# Patient Record
Sex: Male | Born: 1960
Health system: Southern US, Community
[De-identification: ages and names within clinical notes are randomized; demographics above are authoritative.]

## PROBLEM LIST (undated history)

## (undated) VITALS — BP 141/92 | HR 93 | Temp 97.1°F | Resp 20 | Ht 68.11 in | Wt 180.0 lb

## (undated) DIAGNOSIS — I10 Essential (primary) hypertension: Secondary | ICD-10-CM

## (undated) DIAGNOSIS — J40 Bronchitis, not specified as acute or chronic: Secondary | ICD-10-CM

## (undated) DIAGNOSIS — F1910 Other psychoactive substance abuse, uncomplicated: Secondary | ICD-10-CM

## (undated) DIAGNOSIS — G8929 Other chronic pain: Secondary | ICD-10-CM

## (undated) DIAGNOSIS — M79602 Pain in left arm: Secondary | ICD-10-CM

## (undated) DIAGNOSIS — F1010 Alcohol abuse, uncomplicated: Secondary | ICD-10-CM

## (undated) DIAGNOSIS — K859 Acute pancreatitis without necrosis or infection, unspecified: Secondary | ICD-10-CM

## (undated) DIAGNOSIS — F319 Bipolar disorder, unspecified: Secondary | ICD-10-CM

## (undated) DIAGNOSIS — S8290XA Unspecified fracture of unspecified lower leg, initial encounter for closed fracture: Secondary | ICD-10-CM

## (undated) DIAGNOSIS — M549 Dorsalgia, unspecified: Secondary | ICD-10-CM

## (undated) DIAGNOSIS — K863 Pseudocyst of pancreas: Secondary | ICD-10-CM

## (undated) DIAGNOSIS — E785 Hyperlipidemia, unspecified: Secondary | ICD-10-CM

## (undated) DIAGNOSIS — E119 Type 2 diabetes mellitus without complications: Secondary | ICD-10-CM

## (undated) DIAGNOSIS — M1990 Unspecified osteoarthritis, unspecified site: Secondary | ICD-10-CM

## (undated) DIAGNOSIS — K219 Gastro-esophageal reflux disease without esophagitis: Secondary | ICD-10-CM

## (undated) DIAGNOSIS — G43909 Migraine, unspecified, not intractable, without status migrainosus: Secondary | ICD-10-CM

## (undated) DIAGNOSIS — M542 Cervicalgia: Secondary | ICD-10-CM

## (undated) HISTORY — DX: Bipolar disorder, unspecified: F31.9

## (undated) HISTORY — DX: Alcohol abuse, uncomplicated: F10.10

## (undated) HISTORY — PX: NOSE SURGERY: SHX723

## (undated) HISTORY — DX: Hyperlipidemia, unspecified: E78.5

---

## 2004-05-02 ENCOUNTER — Emergency Department (HOSPITAL_COMMUNITY): Admission: EM | Admit: 2004-05-02 | Discharge: 2004-05-02 | Payer: Self-pay | Admitting: *Deleted

## 2005-04-06 ENCOUNTER — Emergency Department (HOSPITAL_COMMUNITY): Admission: EM | Admit: 2005-04-06 | Discharge: 2005-04-06 | Payer: Self-pay | Admitting: Emergency Medicine

## 2007-05-23 ENCOUNTER — Emergency Department (HOSPITAL_COMMUNITY): Admission: EM | Admit: 2007-05-23 | Discharge: 2007-05-23 | Payer: Self-pay | Admitting: Emergency Medicine

## 2007-07-13 ENCOUNTER — Emergency Department (HOSPITAL_COMMUNITY): Admission: EM | Admit: 2007-07-13 | Discharge: 2007-07-13 | Payer: Self-pay | Admitting: Emergency Medicine

## 2007-10-11 ENCOUNTER — Emergency Department (HOSPITAL_COMMUNITY): Admission: EM | Admit: 2007-10-11 | Discharge: 2007-10-11 | Payer: Self-pay | Admitting: Emergency Medicine

## 2007-10-25 ENCOUNTER — Emergency Department (HOSPITAL_COMMUNITY): Admission: EM | Admit: 2007-10-25 | Discharge: 2007-10-25 | Payer: Self-pay | Admitting: Emergency Medicine

## 2008-07-29 ENCOUNTER — Emergency Department (HOSPITAL_COMMUNITY): Admission: EM | Admit: 2008-07-29 | Discharge: 2008-07-29 | Payer: Self-pay | Admitting: Emergency Medicine

## 2008-09-19 ENCOUNTER — Emergency Department (HOSPITAL_COMMUNITY): Admission: EM | Admit: 2008-09-19 | Discharge: 2008-09-19 | Payer: Self-pay | Admitting: Emergency Medicine

## 2008-12-09 ENCOUNTER — Emergency Department (HOSPITAL_COMMUNITY): Admission: EM | Admit: 2008-12-09 | Discharge: 2008-12-09 | Payer: Self-pay | Admitting: Emergency Medicine

## 2008-12-11 ENCOUNTER — Emergency Department (HOSPITAL_COMMUNITY): Admission: EM | Admit: 2008-12-11 | Discharge: 2008-12-11 | Payer: Self-pay | Admitting: Emergency Medicine

## 2009-05-18 ENCOUNTER — Emergency Department (HOSPITAL_COMMUNITY): Admission: EM | Admit: 2009-05-18 | Discharge: 2009-05-18 | Payer: Self-pay | Admitting: Emergency Medicine

## 2009-06-07 ENCOUNTER — Emergency Department (HOSPITAL_COMMUNITY): Admission: EM | Admit: 2009-06-07 | Discharge: 2009-06-07 | Payer: Self-pay | Admitting: Emergency Medicine

## 2009-06-09 ENCOUNTER — Emergency Department (HOSPITAL_COMMUNITY): Admission: EM | Admit: 2009-06-09 | Discharge: 2009-06-09 | Payer: Self-pay | Admitting: Otolaryngology

## 2009-06-12 ENCOUNTER — Emergency Department (HOSPITAL_COMMUNITY): Admission: EM | Admit: 2009-06-12 | Discharge: 2009-06-12 | Payer: Self-pay | Admitting: Emergency Medicine

## 2009-08-08 ENCOUNTER — Emergency Department (HOSPITAL_COMMUNITY): Admission: EM | Admit: 2009-08-08 | Discharge: 2009-08-08 | Payer: Self-pay | Admitting: Emergency Medicine

## 2009-08-17 ENCOUNTER — Emergency Department (HOSPITAL_COMMUNITY): Admission: EM | Admit: 2009-08-17 | Discharge: 2009-08-17 | Payer: Self-pay | Admitting: Emergency Medicine

## 2010-07-03 ENCOUNTER — Emergency Department (HOSPITAL_COMMUNITY): Admission: EM | Admit: 2010-07-03 | Discharge: 2010-07-04 | Payer: Self-pay | Admitting: Emergency Medicine

## 2010-07-25 ENCOUNTER — Emergency Department (HOSPITAL_COMMUNITY): Admission: EM | Admit: 2010-07-25 | Discharge: 2010-07-25 | Payer: Self-pay | Admitting: Emergency Medicine

## 2010-08-05 ENCOUNTER — Emergency Department (HOSPITAL_COMMUNITY): Admission: EM | Admit: 2010-08-05 | Discharge: 2010-08-05 | Payer: Self-pay | Admitting: Emergency Medicine

## 2011-02-23 ENCOUNTER — Emergency Department (HOSPITAL_COMMUNITY)
Admission: EM | Admit: 2011-02-23 | Discharge: 2011-02-23 | Disposition: A | Discharge status: Home / Self Care | Payer: Medicaid Other | Attending: Emergency Medicine | Admitting: Emergency Medicine

## 2011-02-23 DIAGNOSIS — M79609 Pain in unspecified limb: Secondary | ICD-10-CM | POA: Insufficient documentation

## 2011-02-23 DIAGNOSIS — M5430 Sciatica, unspecified side: Secondary | ICD-10-CM | POA: Insufficient documentation

## 2011-03-08 LAB — BASIC METABOLIC PANEL
BUN: 13 mg/dL (ref 6–23)
CO2: 28 mEq/L (ref 19–32)
Calcium: 8.5 mg/dL (ref 8.4–10.5)
Chloride: 101 mEq/L (ref 96–112)
Creatinine, Ser: 1.06 mg/dL (ref 0.4–1.5)
GFR calc Af Amer: >60 mL/min (ref >60)
GFR calc non Af Amer: >60 mL/min (ref >60)
Glucose, Bld: 86 mg/dL (ref 70–99)
Potassium: 4 mEq/L (ref 3.5–5.1)
Sodium: 136 mEq/L (ref 135–145)

## 2011-03-08 LAB — URIC ACID: Uric Acid, Serum: 8.9 mg/dL — ABNORMAL HIGH (ref 4.0–7.8)

## 2011-03-10 LAB — ETHANOL: Alcohol, Ethyl (B): 12 mg/dL — ABNORMAL HIGH (ref 0–10)

## 2011-03-10 LAB — COMPREHENSIVE METABOLIC PANEL
ALT: 18 U/L (ref 0–53)
AST: 34 U/L (ref 0–37)
Albumin: 3.5 g/dL (ref 3.5–5.2)
Alkaline Phosphatase: 39 U/L (ref 39–117)
BUN: 20 mg/dL (ref 6–23)
CO2: 28 mEq/L (ref 19–32)
Calcium: 8.7 mg/dL (ref 8.4–10.5)
Chloride: 101 mEq/L (ref 96–112)
Creatinine, Ser: 0.89 mg/dL (ref 0.4–1.5)
GFR calc Af Amer: >60 mL/min (ref >60)
GFR calc non Af Amer: >60 mL/min (ref >60)
Glucose, Bld: 88 mg/dL (ref 70–99)
Potassium: 3.5 mEq/L (ref 3.5–5.1)
Sodium: 137 mEq/L (ref 135–145)
Total Bilirubin: 0.8 mg/dL (ref 0.3–1.2)
Total Protein: 6.6 g/dL (ref 6.0–8.3)

## 2011-03-10 LAB — DIFFERENTIAL
Basophils Absolute: 0 10*3/uL (ref 0.0–0.1)
Basophils Relative: 1 % (ref 0–1)
Eosinophils Absolute: 0.1 10*3/uL (ref 0.0–0.7)
Eosinophils Relative: 2 % (ref 0–5)
Lymphocytes Relative: 42 % (ref 12–46)
Lymphs Abs: 2.2 10*3/uL (ref 0.7–4.0)
Monocytes Absolute: 0.4 10*3/uL (ref 0.1–1.0)
Monocytes Relative: 8 % (ref 3–12)
Neutro Abs: 2.5 10*3/uL (ref 1.7–7.7)
Neutrophils Relative %: 47 % (ref 43–77)

## 2011-03-10 LAB — LIPASE, BLOOD: Lipase: 25 U/L (ref 11–59)

## 2011-03-10 LAB — CBC
HCT: 40.5 % (ref 39.0–52.0)
Hemoglobin: 14 g/dL (ref 13.0–17.0)
MCH: 31 pg (ref 26.0–34.0)
MCHC: 34.6 g/dL (ref 30.0–36.0)
MCV: 89.5 fL (ref 78.0–100.0)
Platelets: 259 10*3/uL (ref 150–400)
RBC: 4.53 MIL/uL (ref 4.22–5.81)
RDW: 15.7 % — ABNORMAL HIGH (ref 11.5–15.5)
WBC: 5.2 10*3/uL (ref 4.0–10.5)

## 2011-03-13 ENCOUNTER — Inpatient Hospital Stay (HOSPITAL_COMMUNITY)
Admission: RE | Admit: 2011-03-13 | Discharge: 2011-03-16 | DRG: 439 | Disposition: A | Discharge status: Home / Self Care | Payer: Medicare Other | Source: Ambulatory Visit | Attending: Internal Medicine | Admitting: Internal Medicine

## 2011-03-13 DIAGNOSIS — K7010 Alcoholic hepatitis without ascites: Secondary | ICD-10-CM | POA: Diagnosis present

## 2011-03-13 DIAGNOSIS — K7689 Other specified diseases of liver: Secondary | ICD-10-CM | POA: Diagnosis present

## 2011-03-13 DIAGNOSIS — E872 Acidosis, unspecified: Secondary | ICD-10-CM | POA: Diagnosis present

## 2011-03-13 DIAGNOSIS — F17200 Nicotine dependence, unspecified, uncomplicated: Secondary | ICD-10-CM | POA: Diagnosis present

## 2011-03-13 DIAGNOSIS — F1020 Alcohol dependence, uncomplicated: Secondary | ICD-10-CM | POA: Diagnosis present

## 2011-03-13 DIAGNOSIS — E871 Hypo-osmolality and hyponatremia: Secondary | ICD-10-CM | POA: Diagnosis not present

## 2011-03-13 DIAGNOSIS — E781 Pure hyperglyceridemia: Secondary | ICD-10-CM | POA: Diagnosis present

## 2011-03-13 DIAGNOSIS — K859 Acute pancreatitis without necrosis or infection, unspecified: Principal | ICD-10-CM | POA: Diagnosis present

## 2011-03-13 DIAGNOSIS — E876 Hypokalemia: Secondary | ICD-10-CM | POA: Diagnosis not present

## 2011-03-14 ENCOUNTER — Emergency Department (HOSPITAL_COMMUNITY): Payer: Medicare Other

## 2011-03-14 LAB — DIFFERENTIAL
Basophils Absolute: 0.1 10*3/uL (ref 0.0–0.1)
Basophils Relative: 1 % (ref 0–1)
Eosinophils Absolute: 0.1 10*3/uL (ref 0.0–0.7)
Eosinophils Relative: 1 % (ref 0–5)
Lymphocytes Relative: 31 % (ref 12–46)
Lymphs Abs: 2.3 10*3/uL (ref 0.7–4.0)
Monocytes Absolute: 0.7 10*3/uL (ref 0.1–1.0)
Monocytes Relative: 9 % (ref 3–12)
Neutro Abs: 4.1 10*3/uL (ref 1.7–7.7)
Neutrophils Relative %: 58 % (ref 43–77)

## 2011-03-14 LAB — COMPREHENSIVE METABOLIC PANEL
ALT: 35 U/L (ref 0–53)
AST: 101 U/L — ABNORMAL HIGH (ref 0–37)
Albumin: 3.8 g/dL (ref 3.5–5.2)
Alkaline Phosphatase: 51 U/L (ref 39–117)
BUN: 13 mg/dL (ref 6–23)
CO2: 15 mEq/L — ABNORMAL LOW (ref 19–32)
Calcium: 8.4 mg/dL (ref 8.4–10.5)
Chloride: 107 mEq/L (ref 96–112)
Creatinine, Ser: 0.92 mg/dL (ref 0.4–1.5)
GFR calc Af Amer: >60 mL/min (ref >60)
GFR calc non Af Amer: >60 mL/min (ref >60)
Glucose, Bld: 91 mg/dL (ref 70–99)
Potassium: 4.4 mEq/L (ref 3.5–5.1)
Sodium: 141 mEq/L (ref 135–145)
Total Bilirubin: 1.4 mg/dL — ABNORMAL HIGH (ref 0.3–1.2)
Total Protein: 7.1 g/dL (ref 6.0–8.3)

## 2011-03-14 LAB — URINALYSIS, ROUTINE W REFLEX MICROSCOPIC
Bilirubin Urine: NEGATIVE
Glucose, UA: NEGATIVE mg/dL
Hgb urine dipstick: NEGATIVE
Ketones, ur: NEGATIVE mg/dL
Nitrite: NEGATIVE
Protein, ur: NEGATIVE mg/dL
Specific Gravity, Urine: 1.01 (ref 1.005–1.030)
Urobilinogen, UA: 0.2 mg/dL (ref 0.0–1.0)
pH: 5 (ref 5.0–8.0)

## 2011-03-14 LAB — CBC
HCT: 41.8 % (ref 39.0–52.0)
Hemoglobin: 14.8 g/dL (ref 13.0–17.0)
MCH: 31.4 pg (ref 26.0–34.0)
MCHC: 35.4 g/dL (ref 30.0–36.0)
MCV: 88.7 fL (ref 78.0–100.0)
Platelets: 251 10*3/uL (ref 150–400)
RBC: 4.71 MIL/uL (ref 4.22–5.81)
RDW: 15.9 % — ABNORMAL HIGH (ref 11.5–15.5)
WBC: 7.3 10*3/uL (ref 4.0–10.5)

## 2011-03-14 LAB — LIPASE, BLOOD: Lipase: 101 U/L — ABNORMAL HIGH (ref 11–59)

## 2011-03-14 LAB — TYPE AND SCREEN
ABO/RH(D): A POS
Antibody Screen: NEGATIVE

## 2011-03-14 LAB — LACTIC ACID, PLASMA: Lactic Acid, Venous: 2.7 mmol/L — ABNORMAL HIGH (ref 0.5–2.2)

## 2011-03-14 MED ORDER — IOHEXOL 300 MG/ML  SOLN
100.0000 mL | Freq: Once | INTRAMUSCULAR | Status: AC | PRN
  Administered 2011-03-14: 100 mL via INTRAVENOUS

## 2011-03-15 LAB — COMPREHENSIVE METABOLIC PANEL WITH GFR
ALT: 42 U/L (ref 0–53)
AST: 77 U/L — ABNORMAL HIGH (ref 0–37)
Albumin: 3 g/dL — ABNORMAL LOW (ref 3.5–5.2)
Alkaline Phosphatase: 48 U/L (ref 39–117)
BUN: 6 mg/dL (ref 6–23)
CO2: 25 meq/L (ref 19–32)
Calcium: 7.4 mg/dL — ABNORMAL LOW (ref 8.4–10.5)
Chloride: 97 meq/L (ref 96–112)
Creatinine, Ser: 0.82 mg/dL (ref 0.4–1.5)
GFR calc non Af Amer: >60 mL/min
Glucose, Bld: 120 mg/dL — ABNORMAL HIGH (ref 70–99)
Potassium: 3.8 meq/L (ref 3.5–5.1)
Sodium: 132 meq/L — ABNORMAL LOW (ref 135–145)
Total Bilirubin: 0.9 mg/dL (ref 0.3–1.2)
Total Protein: 6.6 g/dL (ref 6.0–8.3)

## 2011-03-15 LAB — CBC
HCT: 41 % (ref 39.0–52.0)
Hemoglobin: 13.8 g/dL (ref 13.0–17.0)
MCH: 30.4 pg (ref 26.0–34.0)
MCHC: 33.7 g/dL (ref 30.0–36.0)
MCV: 90.3 fL (ref 78.0–100.0)
Platelets: 215 10*3/uL (ref 150–400)
RBC: 4.54 MIL/uL (ref 4.22–5.81)
RDW: 16.3 % — ABNORMAL HIGH (ref 11.5–15.5)
WBC: 8.7 10*3/uL (ref 4.0–10.5)

## 2011-03-15 LAB — DIFFERENTIAL
Basophils Absolute: 0 10*3/uL (ref 0.0–0.1)
Basophils Relative: 0 % (ref 0–1)
Eosinophils Absolute: 0 10*3/uL (ref 0.0–0.7)
Eosinophils Relative: 0 % (ref 0–5)
Lymphocytes Relative: 12 % (ref 12–46)
Lymphs Abs: 1 10*3/uL (ref 0.7–4.0)
Monocytes Absolute: 0.4 10*3/uL (ref 0.1–1.0)
Monocytes Relative: 5 % (ref 3–12)
Neutro Abs: 7.2 10*3/uL (ref 1.7–7.7)
Neutrophils Relative %: 83 % — ABNORMAL HIGH (ref 43–77)

## 2011-03-15 LAB — LIPASE, BLOOD: Lipase: 92 U/L — ABNORMAL HIGH (ref 11–59)

## 2011-03-15 LAB — TRIGLYCERIDES: Triglycerides: 1726 mg/dL — ABNORMAL HIGH

## 2011-03-16 LAB — BASIC METABOLIC PANEL
BUN: 4 mg/dL — ABNORMAL LOW (ref 6–23)
CO2: 29 mEq/L (ref 19–32)
Calcium: 7.2 mg/dL — ABNORMAL LOW (ref 8.4–10.5)
Chloride: 98 mEq/L (ref 96–112)
Creatinine, Ser: 0.82 mg/dL (ref 0.4–1.5)
GFR calc Af Amer: >60 mL/min (ref >60)
GFR calc non Af Amer: >60 mL/min (ref >60)
Glucose, Bld: 95 mg/dL (ref 70–99)
Potassium: 3.4 mEq/L — ABNORMAL LOW (ref 3.5–5.1)
Sodium: 132 mEq/L — ABNORMAL LOW (ref 135–145)

## 2011-03-16 LAB — MAGNESIUM: Magnesium: 2.1 mg/dL (ref 1.5–2.5)

## 2011-03-16 LAB — ALBUMIN: Albumin: 2.7 g/dL — ABNORMAL LOW (ref 3.5–5.2)

## 2011-03-16 LAB — LIPASE, BLOOD: Lipase: 49 U/L (ref 11–59)

## 2011-03-16 LAB — LACTIC ACID, PLASMA: Lactic Acid, Venous: 1.1 mmol/L (ref 0.5–2.2)

## 2011-03-16 NOTE — H&P (Signed)
[name] [name], [name] [name]                 ACCOUNT NO.:  [id number]  MEDICAL RECORD NO.:  [id number]           PATIENT TYPE:  I  LOCATION:  A214                          FACILITY:  [facility]  PHYSICIAN:  Houston Siren, MD           DATE OF BIRTH:  [birth year]  DATE OF ADMISSION:  03/13/2011 DATE OF DISCHARGE:  LH                             HISTORY & PHYSICAL   PRIMARY CARE PHYSICIAN:  None.  REASON FOR ADMISSION:  Acute pancreatitis.  ADVANCE DIRECTIVES:  Full code.  HISTORY OF PRESENT ILLNESS:  This is a 50 year old male with history of alcohol abuse (currently 5-6 beers per day), GERD, gout, and polysubstance abuse, presents to [facility] Emergency Room with epigastric pain, nausea, but no vomiting for 24 hours.  He has no fever or chills, black or bloody stool.  He denies any diarrhea and no chest pain or shortness of breath.  He has no distant travel or ill contact. Wife denies him using any illicit drugs at this time.  Evaluation in the emergency room included abdominal CT which showed pancreatitis and fatty liver along with mildly distended gallbladder, but no visible stone. His lipase is 101 (11-59).  He has a normal creatinine of 0.92 and his blood glucose is 91.  He has normal liver function tests.  He has no fever and a normal white count of 7300.  His hemoglobin is normal 14.8. He received several bouts of IV Dilaudid but still has persistent pain, and thus hospitalist was asked to admit the patient for acute alcoholic pancreatitis.  PAST MEDICAL HISTORY:  As above.  ALLERGY:  BEE STING and PENICILLIN.  CURRENT MEDICATIONS:  Zantac.  PAST SURGICAL HISTORY:  None.  SOCIAL HISTORY:  He is a smoker and drinks heavily.  He is married and has one child.  Previously, he worked as a Pharmacist, community.  REVIEW OF SYSTEMS:  Otherwise unremarkable.  PHYSICAL EXAMINATION:  VITAL SIGNS:  Heart rate of 94, blood pressure 114/78, and respiratory rate 16. GENERAL:  He is alert and  oriented and is in no apparent distress.  He does have abdominal pain with frequent grimaces. HEENT:  Sclerae are nonicteric.  Throat is clear.  Tongue is midline. Speech is fluent. NECK:  Supple. CARDIAC:  S1 and S2 regular.  I did not hear any murmur, rub, or gallop. LUNGS:  Clear. ABDOMEN:  Tender diffusely, especially in the epigastric area.  Bowel sounds present.  No rebound.  No palpable mass. EXTREMITIES:  No edema.  No calf tenderness.  He has good distal pulses bilaterally and does not have any acro or central cyanosis. NEUROLOGIC:  Unremarkable. PSYCHIATRIC:  Unremarkable.  OBJECTIVE FINDINGS:  Serum sodium 141, potassium 4.4, creatinine 0.92, BUN of 13, bicarb of 15.  Liver function tests are normal.  White count 7300, hemoglobin of 14.8, platelet count of 251,000.  Abdominal and pelvic CT showed fatty infiltration of liver with inflammatory process in the right upper quadrant of the abdomen, most consistent with pancreatitis.  Lactic acid is 2.7.  Abdominal series negative.  No EKG was done.  IMPRESSION:  This is a 50 year old presenting with acute alcoholic pancreatitis.  He has severe pain, but overall he is not very ill.  He has no evidence of infection and maintained hemodynamic stability.  We will admit him to the telemetry floor.  We will give pain medication along with IV fluids.  He is at risk for alcohol withdrawal, and we will put on Ativan p.o. CIWA protocol.  We will admit him to [facility] Team #2. He is a full code.  I discussed that it is important for him to stop alcohol and cigarettes.     Houston Siren, MD     PL/MEDQ  D:  03/14/2011  T:  03/14/2011  Job:  [id number]  Electronically Signed by Houston Siren  on 03/16/2011 10:07:31 PM

## 2011-03-17 LAB — CALCIUM, IONIZED: Calcium, Ion: 1.01 mmol/L — ABNORMAL LOW (ref 1.12–1.32)

## 2011-03-17 NOTE — Discharge Summary (Signed)
NAMEBRYSON, [name]                 ACCOUNT NO.:  [id number]  MEDICAL RECORD NO.:  [id number]           PATIENT TYPE:  I  LOCATION:  A316                          FACILITY:  [facility]  PHYSICIAN:  Elliot Cousin, M.D.    DATE OF BIRTH:  [birth year]  DATE OF ADMISSION:  03/13/2011 DATE OF DISCHARGE:  03/24/2012LH                              DISCHARGE SUMMARY   DISCHARGE DIAGNOSES: 1. Acute pancreatitis, likely secondary to a combination of     hypertriglyceridemia and alcohol abuse.  The patient's lipase was     101 on admission and 49 at the time of hospital discharge. 2. Inflammatory process in the right upper quadrant of the abdomen     most consistent with pancreatitis and fatty infiltration of the     liver per CT scan of the abdomen and pelvis with contrast on March 14, 2011. 3. Hypertriglyceridemia.  The patient's triglyceride level was     elevated at 1726. 4. Mild alcohol-associated hepatitis. 5. Hypocalcemia which corrected to 8.2. 6. Metabolic/lactic acidosis, resolved. 7. Alcohol abuse. 8. Tobacco abuse. 9. Hypokalemia.  DISCHARGE MEDICATIONS: 1. Gemfibrozil 600 mg b.i.d. before meals.  2. Multivitamin 1 capsule daily. 3. Percocet 5/325 mg every 6 hours as needed for pain. 4. Phenergan 12.5 mg 1-2 tablets every 6 hours as needed for nausea. 5. MiraLax laxative 17 g daily as needed for constipation. 6. Loratadine 10 mg daily as needed for allergy symptoms. 7. Ranitidine 150 mg daily as needed for reflux or indigestion.  DISCHARGE DISPOSITION:  The patient was discharged to home in improved and stable condition on March 16, 2011.  He was advised to follow up with a primary care provider at the [facility] Department at the next available appointment.  CONSULTATIONS:  None.  PROCEDURE PERFORMED IN THE EMERGENCY DEPARTMENT: 1. CT of the abdomen and pelvis with contrast on March 14, 2011.  The     results were dictated above.  In addition, the  gallbladder was     mildly distended with no evidence of gallstones. 2. Acute abdominal series on March 14, 2011.  The results revealed a     normal study.  HISTORY OF PRESENT ILLNESS:  The patient is a 50 year old man with a past medical history significant for seasonal allergies, heartburn, and alcohol abuse.  He presented to the emergency department on March 13, 2011, with a chief complaint of abdominal pain.  When he was initially evaluated, he was noted to be hemodynamically stable and afebrile.  His lipase was elevated at 101.  His lactic acid was elevated at 2.7.  His total bilirubin was 1.4, SGOT was 101, and SGPT was 35.  His acute abdominal series was within normal limits.  The CT scan of his abdomen and pelvis revealed findings consistent with acute pancreatitis.  He was admitted for further evaluation and management.  HOSPITAL COURSE:  The patient was made to be n.p.o. for bowel rest.  His pain was treated with as-needed Dilaudid.  He was started on IV fluid hydration.  The alcohol withdrawal protocol was ordered with Ativan and  vitamin therapy.  Because of his alcohol abuse, he was strongly counseled to stop drinking.  He did not believe he had a problem that needed further assistance.  I did recommend Alcoholics Anonymous anyway.  He did not show any signs or symptoms consistent with alcohol withdrawal syndrome.  He was started on as-needed Zofran and as-needed Phenergan for nausea.  His lipase and liver transaminases were followed closely.  For further evaluation, a triglyceride level was ordered.   It was quite elevated at 1726.  Following the result today, he was started on gemfibrozil 600 mg b.i.d.  His urinalysis was within normal limits.  His lactic acid which had been elevated at 2.7, normalized to 1.1 upon discharge.  His CO2 which was initially 15 normalized to 29 prior to discharge.  The metabolic acidosis may have been secondary to the acute pancreatitis and    alcohol abuse.  An alcohol level was not ordered on admission.  The patient's blood calcium level fell from 8.4 on admission to 7.2 at the time of hospital discharge.  His albumin level was low at 2.7.  With correction, his calcium level was 8.2, virtually within normal limits. His serum potassium also fell from 4.4 to 3.4 prior to discharge.  This was thought to be secondary to hemodilution from the IV fluids.  He was repleted with 40 mEq of potassium chloride prior to discharge.  His blood magnesium level was within normal limits at 2.1.  The patient became less symptomatic.  His diet was advanced slowly.  He tolerated the advancement fairly well.  He did complain of constipation.  He was given MiraLax which did relieve his constipation. He was strongly advised to follow a low-fat diet and to discontinue drinking alcohol altogether.  He was also counseled on tobacco cessation.  A nicotine patch was placed.  He was advised to follow up at the [facility] for follow up of his triglyceride level and for primary care.  He voiced understanding.     Elliot Cousin, M.D.     DF/MEDQ  D:  03/16/2011  T:  03/16/2011  Job:  [id number]  cc:   [facility].  Electronically Signed by Elliot Cousin M.D. on 03/17/2011 02:45:25 PM

## 2011-03-25 ENCOUNTER — Emergency Department (HOSPITAL_COMMUNITY)
Admission: EM | Admit: 2011-03-25 | Discharge: 2011-03-26 | Disposition: A | Discharge status: Home / Self Care | Payer: Medicaid Other | Attending: Emergency Medicine | Admitting: Emergency Medicine

## 2011-03-25 ENCOUNTER — Emergency Department (HOSPITAL_COMMUNITY): Payer: Medicaid Other

## 2011-03-25 DIAGNOSIS — K7689 Other specified diseases of liver: Secondary | ICD-10-CM | POA: Insufficient documentation

## 2011-03-25 DIAGNOSIS — F17200 Nicotine dependence, unspecified, uncomplicated: Secondary | ICD-10-CM | POA: Insufficient documentation

## 2011-03-25 DIAGNOSIS — F1010 Alcohol abuse, uncomplicated: Secondary | ICD-10-CM | POA: Insufficient documentation

## 2011-03-25 DIAGNOSIS — K859 Acute pancreatitis without necrosis or infection, unspecified: Secondary | ICD-10-CM | POA: Insufficient documentation

## 2011-03-25 DIAGNOSIS — R109 Unspecified abdominal pain: Secondary | ICD-10-CM | POA: Insufficient documentation

## 2011-03-25 DIAGNOSIS — K219 Gastro-esophageal reflux disease without esophagitis: Secondary | ICD-10-CM | POA: Insufficient documentation

## 2011-03-25 DIAGNOSIS — Z79899 Other long term (current) drug therapy: Secondary | ICD-10-CM | POA: Insufficient documentation

## 2011-03-25 LAB — COMPREHENSIVE METABOLIC PANEL
ALT: 17 U/L (ref 0–53)
AST: 35 U/L (ref 0–37)
Albumin: 3.4 g/dL — ABNORMAL LOW (ref 3.5–5.2)
Alkaline Phosphatase: 52 U/L (ref 39–117)
BUN: 7 mg/dL (ref 6–23)
CO2: 26 mEq/L (ref 19–32)
Calcium: 9.2 mg/dL (ref 8.4–10.5)
Chloride: 100 mEq/L (ref 96–112)
Creatinine, Ser: 0.87 mg/dL (ref 0.4–1.5)
GFR calc Af Amer: >60 mL/min (ref >60)
GFR calc non Af Amer: >60 mL/min (ref >60)
Glucose, Bld: 96 mg/dL (ref 70–99)
Potassium: 4 mEq/L (ref 3.5–5.1)
Sodium: 139 mEq/L (ref 135–145)
Total Bilirubin: 0.7 mg/dL (ref 0.3–1.2)
Total Protein: 6.9 g/dL (ref 6.0–8.3)

## 2011-03-25 LAB — DIFFERENTIAL
Basophils Absolute: 0.1 10*3/uL (ref 0.0–0.1)
Basophils Relative: 1 % (ref 0–1)
Eosinophils Absolute: 0.1 10*3/uL (ref 0.0–0.7)
Eosinophils Relative: 1 % (ref 0–5)
Lymphocytes Relative: 38 % (ref 12–46)
Lymphs Abs: 2.9 10*3/uL (ref 0.7–4.0)
Monocytes Absolute: 0.4 10*3/uL (ref 0.1–1.0)
Monocytes Relative: 6 % (ref 3–12)
Neutro Abs: 4.1 10*3/uL (ref 1.7–7.7)
Neutrophils Relative %: 54 % (ref 43–77)

## 2011-03-25 LAB — CBC
HCT: 39.6 % (ref 39.0–52.0)
Hemoglobin: 13.1 g/dL (ref 13.0–17.0)
MCH: 29.6 pg (ref 26.0–34.0)
MCHC: 33.1 g/dL (ref 30.0–36.0)
MCV: 89.6 fL (ref 78.0–100.0)
Platelets: 561 10*3/uL — ABNORMAL HIGH (ref 150–400)
RBC: 4.42 MIL/uL (ref 4.22–5.81)
RDW: 15.9 % — ABNORMAL HIGH (ref 11.5–15.5)
WBC: 7.5 10*3/uL (ref 4.0–10.5)

## 2011-03-25 LAB — LIPASE, BLOOD: Lipase: 91 U/L — ABNORMAL HIGH (ref 11–59)

## 2011-03-26 LAB — URINALYSIS, ROUTINE W REFLEX MICROSCOPIC
Bilirubin Urine: NEGATIVE
Glucose, UA: NEGATIVE mg/dL
Hgb urine dipstick: NEGATIVE
Ketones, ur: NEGATIVE mg/dL
Nitrite: NEGATIVE
Protein, ur: NEGATIVE mg/dL
Specific Gravity, Urine: 1.01 (ref 1.005–1.030)
Urobilinogen, UA: 0.2 mg/dL (ref 0.0–1.0)
pH: 6 (ref 5.0–8.0)

## 2011-03-26 MED ORDER — IOHEXOL 300 MG/ML  SOLN
100.0000 mL | Freq: Once | INTRAMUSCULAR | Status: AC | PRN
  Administered 2011-03-26: 100 mL via INTRAVENOUS

## 2011-04-01 LAB — DIFFERENTIAL
Basophils Absolute: 0.1 10*3/uL (ref 0.0–0.1)
Basophils Relative: 1 % (ref 0–1)
Eosinophils Absolute: 0.1 10*3/uL (ref 0.0–0.7)
Eosinophils Relative: 1 % (ref 0–5)
Lymphocytes Relative: 35 % (ref 12–46)
Lymphs Abs: 2 10*3/uL (ref 0.7–4.0)
Monocytes Absolute: 0.6 10*3/uL (ref 0.1–1.0)
Monocytes Relative: 10 % (ref 3–12)
Neutro Abs: 3.1 10*3/uL (ref 1.7–7.7)
Neutrophils Relative %: 53 % (ref 43–77)

## 2011-04-01 LAB — RAPID URINE DRUG SCREEN, HOSP PERFORMED
Amphetamines: NOT DETECTED
Barbiturates: NOT DETECTED
Benzodiazepines: NOT DETECTED
Cocaine: POSITIVE — AB
Opiates: NOT DETECTED
Tetrahydrocannabinol: NOT DETECTED

## 2011-04-01 LAB — URINALYSIS, ROUTINE W REFLEX MICROSCOPIC
Bilirubin Urine: NEGATIVE
Glucose, UA: NEGATIVE mg/dL
Hgb urine dipstick: NEGATIVE
Ketones, ur: NEGATIVE mg/dL
Nitrite: NEGATIVE
Protein, ur: NEGATIVE mg/dL
Specific Gravity, Urine: <1.005 — ABNORMAL LOW (ref 1.005–1.030)
Urobilinogen, UA: 0.2 mg/dL (ref 0.0–1.0)
pH: 5.5 (ref 5.0–8.0)

## 2011-04-01 LAB — CBC
HCT: 37.2 % — ABNORMAL LOW (ref 39.0–52.0)
Hemoglobin: 13.3 g/dL (ref 13.0–17.0)
MCHC: 35.7 g/dL (ref 30.0–36.0)
MCV: 94.4 fL (ref 78.0–100.0)
Platelets: 252 10*3/uL (ref 150–400)
RBC: 3.94 MIL/uL — ABNORMAL LOW (ref 4.22–5.81)
RDW: 14.2 % (ref 11.5–15.5)
WBC: 5.8 10*3/uL (ref 4.0–10.5)

## 2011-04-01 LAB — ETHANOL: Alcohol, Ethyl (B): 329 mg/dL — ABNORMAL HIGH (ref 0–10)

## 2011-04-01 LAB — BASIC METABOLIC PANEL
BUN: 11 mg/dL (ref 6–23)
CO2: 24 mEq/L (ref 19–32)
Calcium: 8.3 mg/dL — ABNORMAL LOW (ref 8.4–10.5)
Chloride: 107 mEq/L (ref 96–112)
Creatinine, Ser: 0.8 mg/dL (ref 0.4–1.5)
GFR calc Af Amer: >60 mL/min (ref >60)
GFR calc non Af Amer: >60 mL/min (ref >60)
Glucose, Bld: 84 mg/dL (ref 70–99)
Potassium: 3.4 mEq/L — ABNORMAL LOW (ref 3.5–5.1)
Sodium: 140 mEq/L (ref 135–145)

## 2011-04-01 LAB — PROTIME-INR
INR: 1 (ref 0.00–1.49)
Prothrombin Time: 12.8 seconds (ref 11.6–15.2)

## 2011-04-07 ENCOUNTER — Emergency Department (HOSPITAL_COMMUNITY)
Admission: EM | Admit: 2011-04-07 | Discharge: 2011-04-07 | Disposition: A | Discharge status: Home / Self Care | Payer: Medicaid Other | Attending: Emergency Medicine | Admitting: Emergency Medicine

## 2011-04-07 DIAGNOSIS — R112 Nausea with vomiting, unspecified: Secondary | ICD-10-CM | POA: Insufficient documentation

## 2011-04-07 DIAGNOSIS — K859 Acute pancreatitis without necrosis or infection, unspecified: Secondary | ICD-10-CM | POA: Insufficient documentation

## 2011-04-07 DIAGNOSIS — R079 Chest pain, unspecified: Secondary | ICD-10-CM | POA: Insufficient documentation

## 2011-04-07 LAB — BASIC METABOLIC PANEL
BUN: 11 mg/dL (ref 6–23)
CO2: 27 mEq/L (ref 19–32)
Calcium: 9.3 mg/dL (ref 8.4–10.5)
Chloride: 98 mEq/L (ref 96–112)
Creatinine, Ser: 0.96 mg/dL (ref 0.4–1.5)
GFR calc Af Amer: >60 mL/min (ref >60)
GFR calc non Af Amer: >60 mL/min (ref >60)
Glucose, Bld: 91 mg/dL (ref 70–99)
Potassium: 3.9 mEq/L (ref 3.5–5.1)
Sodium: 134 mEq/L — ABNORMAL LOW (ref 135–145)

## 2011-04-07 LAB — HEPATIC FUNCTION PANEL
ALT: 13 U/L (ref 0–53)
AST: 23 U/L (ref 0–37)
Albumin: 3.7 g/dL (ref 3.5–5.2)
Alkaline Phosphatase: 59 U/L (ref 39–117)
Bilirubin, Direct: 0.1 mg/dL (ref 0.0–0.3)
Indirect Bilirubin: 0.4 mg/dL (ref 0.3–0.9)
Total Bilirubin: 0.5 mg/dL (ref 0.3–1.2)
Total Protein: 7.6 g/dL (ref 6.0–8.3)

## 2011-04-07 LAB — CBC
HCT: 42.1 % (ref 39.0–52.0)
Hemoglobin: 14.1 g/dL (ref 13.0–17.0)
MCH: 30.8 pg (ref 26.0–34.0)
MCHC: 33.5 g/dL (ref 30.0–36.0)
MCV: 91.9 fL (ref 78.0–100.0)
Platelets: 418 10*3/uL — ABNORMAL HIGH (ref 150–400)
RBC: 4.58 MIL/uL (ref 4.22–5.81)
RDW: 16.4 % — ABNORMAL HIGH (ref 11.5–15.5)
WBC: 6.7 10*3/uL (ref 4.0–10.5)

## 2011-04-07 LAB — DIFFERENTIAL
Basophils Absolute: 0.1 10*3/uL (ref 0.0–0.1)
Basophils Relative: 1 % (ref 0–1)
Eosinophils Absolute: 0.2 10*3/uL (ref 0.0–0.7)
Eosinophils Relative: 2 % (ref 0–5)
Lymphocytes Relative: 30 % (ref 12–46)
Lymphs Abs: 2 10*3/uL (ref 0.7–4.0)
Monocytes Absolute: 0.5 10*3/uL (ref 0.1–1.0)
Monocytes Relative: 8 % (ref 3–12)
Neutro Abs: 4 10*3/uL (ref 1.7–7.7)
Neutrophils Relative %: 59 % (ref 43–77)

## 2011-04-07 LAB — LIPASE, BLOOD: Lipase: 129 U/L — ABNORMAL HIGH (ref 11–59)

## 2011-04-10 ENCOUNTER — Emergency Department (HOSPITAL_COMMUNITY)
Admission: EM | Admit: 2011-04-10 | Discharge: 2011-04-10 | Disposition: A | Discharge status: Home / Self Care | Payer: Medicaid Other | Attending: Emergency Medicine | Admitting: Emergency Medicine

## 2011-04-10 DIAGNOSIS — K859 Acute pancreatitis without necrosis or infection, unspecified: Secondary | ICD-10-CM | POA: Insufficient documentation

## 2011-04-10 DIAGNOSIS — M545 Low back pain, unspecified: Secondary | ICD-10-CM | POA: Insufficient documentation

## 2011-04-10 DIAGNOSIS — R112 Nausea with vomiting, unspecified: Secondary | ICD-10-CM | POA: Insufficient documentation

## 2011-04-10 DIAGNOSIS — K219 Gastro-esophageal reflux disease without esophagitis: Secondary | ICD-10-CM | POA: Insufficient documentation

## 2011-04-10 DIAGNOSIS — F411 Generalized anxiety disorder: Secondary | ICD-10-CM | POA: Insufficient documentation

## 2011-04-10 DIAGNOSIS — Z79899 Other long term (current) drug therapy: Secondary | ICD-10-CM | POA: Insufficient documentation

## 2011-04-10 DIAGNOSIS — R109 Unspecified abdominal pain: Secondary | ICD-10-CM | POA: Insufficient documentation

## 2011-04-10 DIAGNOSIS — M109 Gout, unspecified: Secondary | ICD-10-CM | POA: Insufficient documentation

## 2011-05-28 ENCOUNTER — Emergency Department (HOSPITAL_COMMUNITY)
Admission: EM | Admit: 2011-05-28 | Discharge: 2011-05-28 | Disposition: A | Discharge status: Home / Self Care | Payer: Medicaid Other | Attending: Emergency Medicine | Admitting: Emergency Medicine

## 2011-05-28 DIAGNOSIS — K219 Gastro-esophageal reflux disease without esophagitis: Secondary | ICD-10-CM | POA: Insufficient documentation

## 2011-05-28 DIAGNOSIS — T6391XA Toxic effect of contact with unspecified venomous animal, accidental (unintentional), initial encounter: Secondary | ICD-10-CM | POA: Insufficient documentation

## 2011-05-28 DIAGNOSIS — M109 Gout, unspecified: Secondary | ICD-10-CM | POA: Insufficient documentation

## 2011-05-28 DIAGNOSIS — T63461A Toxic effect of venom of wasps, accidental (unintentional), initial encounter: Secondary | ICD-10-CM | POA: Insufficient documentation

## 2011-06-08 ENCOUNTER — Emergency Department (HOSPITAL_COMMUNITY)
Admission: EM | Admit: 2011-06-08 | Discharge: 2011-06-08 | Disposition: A | Discharge status: Home / Self Care | Payer: Medicaid Other | Attending: Emergency Medicine | Admitting: Emergency Medicine

## 2011-06-08 DIAGNOSIS — F1021 Alcohol dependence, in remission: Secondary | ICD-10-CM | POA: Insufficient documentation

## 2011-06-08 DIAGNOSIS — K219 Gastro-esophageal reflux disease without esophagitis: Secondary | ICD-10-CM | POA: Insufficient documentation

## 2011-06-08 DIAGNOSIS — T6391XA Toxic effect of contact with unspecified venomous animal, accidental (unintentional), initial encounter: Secondary | ICD-10-CM | POA: Insufficient documentation

## 2011-06-08 DIAGNOSIS — T63461A Toxic effect of venom of wasps, accidental (unintentional), initial encounter: Secondary | ICD-10-CM | POA: Insufficient documentation

## 2011-06-19 ENCOUNTER — Emergency Department (HOSPITAL_COMMUNITY)
Admission: EM | Admit: 2011-06-19 | Discharge: 2011-06-19 | Disposition: A | Discharge status: Home / Self Care | Payer: Medicaid Other | Attending: Emergency Medicine | Admitting: Emergency Medicine

## 2011-06-19 DIAGNOSIS — Z880 Allergy status to penicillin: Secondary | ICD-10-CM | POA: Insufficient documentation

## 2011-06-19 DIAGNOSIS — R1013 Epigastric pain: Secondary | ICD-10-CM | POA: Insufficient documentation

## 2011-08-05 ENCOUNTER — Encounter: Payer: Self-pay | Admitting: *Deleted

## 2011-08-05 ENCOUNTER — Emergency Department (HOSPITAL_COMMUNITY)
Admission: EM | Admit: 2011-08-05 | Discharge: 2011-08-05 | Discharge status: Against Medical Advice | Payer: Medicaid Other | Attending: Emergency Medicine | Admitting: Emergency Medicine

## 2011-08-05 ENCOUNTER — Emergency Department (HOSPITAL_COMMUNITY): Payer: Medicaid Other

## 2011-08-05 DIAGNOSIS — M25519 Pain in unspecified shoulder: Secondary | ICD-10-CM | POA: Insufficient documentation

## 2011-08-05 DIAGNOSIS — Z5320 Procedure and treatment not carried out because of patient's decision for unspecified reasons: Secondary | ICD-10-CM | POA: Insufficient documentation

## 2011-08-05 MED ORDER — HYDROCODONE-ACETAMINOPHEN 5-325 MG PO TABS
1.0000 | ORAL_TABLET | Freq: Once | ORAL | Status: AC
  Administered 2011-08-05: 1 via ORAL
  Filled 2011-08-05: qty 1

## 2011-08-05 MED ORDER — IBUPROFEN 800 MG PO TABS
800.0000 mg | ORAL_TABLET | Freq: Once | ORAL | Status: AC
  Administered 2011-08-05: 800 mg via ORAL
  Filled 2011-08-05: qty 1

## 2011-08-05 NOTE — ED Notes (Signed)
Pt at nurse station. PT verbally aggressive at this time. Demanding to get test results. Explained to pt that PA would be back in to see him as soon as possible. Pt yelling and cursing at this nurse. States "How long does it take to get the damn test results. This is fucking ridiculous." Asked pt to lower voice and not use obscenities. Explained that there are other patients and children in dept. Pt states "hell with this. I am leaving". Pt ambulated out of facility with steady gait. PA at nurses station and aware.

## 2011-08-05 NOTE — ED Notes (Signed)
Left shoulder pain x 1 week, no known injury

## 2011-08-08 ENCOUNTER — Emergency Department (HOSPITAL_COMMUNITY)
Admission: EM | Admit: 2011-08-08 | Discharge: 2011-08-08 | Disposition: A | Discharge status: Home / Self Care | Payer: Medicaid Other | Attending: Emergency Medicine | Admitting: Emergency Medicine

## 2011-08-08 ENCOUNTER — Encounter (HOSPITAL_COMMUNITY): Payer: Self-pay

## 2011-08-08 DIAGNOSIS — M25519 Pain in unspecified shoulder: Secondary | ICD-10-CM | POA: Insufficient documentation

## 2011-08-08 DIAGNOSIS — F17200 Nicotine dependence, unspecified, uncomplicated: Secondary | ICD-10-CM | POA: Insufficient documentation

## 2011-08-08 HISTORY — DX: Bronchitis, not specified as acute or chronic: J40

## 2011-08-08 HISTORY — DX: Gastro-esophageal reflux disease without esophagitis: K21.9

## 2011-08-08 MED ORDER — HYDROCODONE-ACETAMINOPHEN 5-325 MG PO TABS
1.0000 | ORAL_TABLET | Freq: Once | ORAL | Status: AC
  Administered 2011-08-08: 1 via ORAL
  Filled 2011-08-08: qty 1

## 2011-08-08 MED ORDER — HYDROCODONE-ACETAMINOPHEN 5-325 MG PO TABS: ORAL_TABLET | ORAL | Status: DC

## 2011-08-08 NOTE — ED Notes (Signed)
Pt reports left shoulder pain x 2 weeks.  Pt denies any injury.

## 2011-08-08 NOTE — ED Provider Notes (Signed)
Medical screening examination/treatment/procedure(s) were performed by non-physician practitioner and as supervising physician I was immediately available for consultation/collaboration.  Juliet Rude. Rubin Payor, MD 08/08/11 1710

## 2011-08-08 NOTE — ED Notes (Signed)
Pt reports shoulder pain for the past few weeks, but has increased over the past few days.  Pt states that the pain in radiating into his back and down his left leg.  Pt denies any weakness, numbness, or tingling.

## 2011-08-08 NOTE — ED Provider Notes (Signed)
History     CSN: [id number] Arrival date & time: 08/08/2011  3:55 PM  Chief Complaint  Patient presents with  . Shoulder Pain   Patient is a 50 y.o. male presenting with shoulder pain. The history is provided by the patient. No language interpreter was used.  Shoulder Pain This is a new problem. The current episode started 1 to 4 weeks ago. The problem occurs constantly. The problem has been unchanged. Associated symptoms comments: States he also had a brief pain today that radiated down his L leg to his foot.  Has two "herniated discs".  No known injury to shoulder.  PCP at [facility] wrote rx for flexeril  But "it puts me to sleep and it doesn't help with pain".. Exacerbated by: movement and palpation. He has tried NSAIDs (muscle relaxant) for the symptoms. The treatment provided no relief.    Past Medical History  Diagnosis Date  . Bronchitis   . Acid reflux     History reviewed. No pertinent past surgical history.  No family history on file.  History  Substance Use Topics  . Smoking status: Current Everyday Smoker -- 0.5 packs/day  . Smokeless tobacco: Not on file  . Alcohol Use: Yes      Review of Systems  Musculoskeletal:       Shoulder pain.  Pain with movement.  Now also centered around L scapula.  All other systems reviewed and are negative.    Physical Exam  BP 122/85  Pulse 100  Temp(Src) 98.9 F (37.2 C) (Oral)  Resp 20  Ht 5' 10.5" (1.791 m)  Wt 202 lb (91.627 kg)  BMI 28.57 kg/m2  SpO2 99%  Physical Exam  Nursing note and vitals reviewed. Constitutional: He is oriented to person, place, and time. Vital signs are normal. He appears well-developed and well-nourished.  HENT:  Head: Normocephalic and atraumatic.  Right Ear: External ear normal.  Left Ear: External ear normal.  Nose: Nose normal.  Mouth/Throat: No oropharyngeal exudate.  Eyes: Conjunctivae and EOM are normal. Pupils are equal, round, and reactive to light. Right eye exhibits no discharge.  Left eye exhibits no discharge. No scleral icterus.  Neck: Normal range of motion. Neck supple. No JVD present. No tracheal deviation present. No thyromegaly present.  Cardiovascular: Normal rate, regular rhythm, normal heart sounds, intact distal pulses and normal pulses.  Exam reveals no gallop and no friction rub.   No murmur heard. Pulmonary/Chest: Effort normal and breath sounds normal. No stridor. No respiratory distress. He has no wheezes. He has no rales. He exhibits no tenderness.  Abdominal: Soft. Normal appearance and bowel sounds are normal. He exhibits no distension and no mass. There is no tenderness. There is no rebound and no guarding.  Musculoskeletal: He exhibits tenderness. He exhibits no edema.       Left shoulder: He exhibits decreased range of motion and tenderness. He exhibits no bony tenderness, no swelling, no crepitus, no deformity, no laceration, no pain, no spasm, normal pulse and normal strength.       Arms: Lymphadenopathy:    He has no cervical adenopathy.  Neurological: He is alert and oriented to person, place, and time. He has normal reflexes. No cranial nerve deficit. Coordination normal. GCS eye subscore is 4. GCS verbal subscore is 5. GCS motor subscore is 6.  Reflex Scores:      Tricep reflexes are 2+ on the right side and 2+ on the left side.      Bicep reflexes are 2+  on the right side and 2+ on the left side.      Brachioradialis reflexes are 2+ on the right side and 2+ on the left side.      Patellar reflexes are 2+ on the right side and 2+ on the left side.      Achilles reflexes are 2+ on the right side and 2+ on the left side. Skin: Skin is warm and dry. No rash noted. He is not diaphoretic.  Psychiatric: He has a normal mood and affect. His speech is normal and behavior is normal. Judgment and thought content normal. Cognition and memory are normal.    ED Course  Procedures  MDM Pt here 3 days ago for same but left angry before visit  completed. 4:50 PM  Shoulder x-rays from 3 days  Ago reviewed with pt.  Will be referred to ortho and sling applied.     Worthy Rancher, PA 08/08/11 [phone]

## 2011-08-15 ENCOUNTER — Emergency Department (HOSPITAL_COMMUNITY): Payer: Medicaid Other

## 2011-08-15 ENCOUNTER — Emergency Department (HOSPITAL_COMMUNITY)
Admission: EM | Admit: 2011-08-15 | Discharge: 2011-08-15 | Disposition: A | Discharge status: Home / Self Care | Payer: Medicaid Other | Attending: Emergency Medicine | Admitting: Emergency Medicine

## 2011-08-15 ENCOUNTER — Encounter (HOSPITAL_COMMUNITY): Payer: Self-pay | Admitting: Emergency Medicine

## 2011-08-15 DIAGNOSIS — IMO0002 Reserved for concepts with insufficient information to code with codable children: Secondary | ICD-10-CM | POA: Insufficient documentation

## 2011-08-15 DIAGNOSIS — M792 Neuralgia and neuritis, unspecified: Secondary | ICD-10-CM

## 2011-08-15 DIAGNOSIS — F17200 Nicotine dependence, unspecified, uncomplicated: Secondary | ICD-10-CM | POA: Insufficient documentation

## 2011-08-15 MED ORDER — NAPROXEN 500 MG PO TABS: 500.0000 mg | ORAL_TABLET | Freq: Two times a day (BID) | ORAL | Status: DC

## 2011-08-15 NOTE — ED Notes (Signed)
Patient returned from x ray at this time. NAD noted. Patient sitting on stretcher.

## 2011-08-15 NOTE — ED Notes (Signed)
Patient with no complaints at this time. Respirations even and unlabored. Skin warm/dry. Discharge instructions reviewed with patient at this time. Patient given opportunity to voice concerns/ask questions. Patient discharged at this time and left Emergency Department with steady gait.   

## 2011-08-15 NOTE — ED Provider Notes (Signed)
History   Scribed for Benny Lennert, MD, the patient was seen in room APA03/APA03. This chart was scribed by Clarita Crane. This patient's care was started at 7:36AM.   CSN: [id number] Arrival date & time: 08/15/2011  7:21 AM  Chief Complaint  Patient presents with  . Shoulder Pain   HPI [name] [name] is a 50 y.o. male who presents to the Emergency Department complaining of constant left shoulder pain described as burning radiating to left side of neck onset 3 weeks ago and persistent since with associated mild numbness of LUE. Denies fever, HA, tingling, chest pain, SOB. States left shoulder pain is aggravated with movement of LUE at shoulder. Patient reports he was evaluated in ED on 08/05/2011 and had Left-shoulder x-ray performed with normal results and was d/c home with Hydrocodone which have not relieved pain.  Patient was also referred to Dr. Romeo Apple but has been unable to make an appointment because he does not have health insurance.   HPI ELEMENTS: Location: left shoulder- anterior and posterior aspect Onset: 3 weeks ago Duration: persistent since onset  Timing: constant  Quality: burning   Modifying factors: movement of LUE at shoulder. Not relieved by Hydrocodone.  Context:  as above  Associated symptoms: +mild numbness of LUE. Denies fever, HA, tingling, chest pain, SOB.  PAST MEDICAL HISTORY:  Past Medical History  Diagnosis Date  . Bronchitis   . Acid reflux     PAST SURGICAL HISTORY:  History reviewed. No pertinent past surgical history.  MEDICATIONS:  Previous Medications   CYCLOBENZAPRINE (FLEXERIL) 10 MG TABLET    Take 10 mg by mouth at bedtime as needed. For muscle relaxation    HYDROCODONE-ACETAMINOPHEN (NORCO) 5-325 MG PER TABLET    One po QID prn pain   IBUPROFEN (ADVIL,MOTRIN) 200 MG TABLET    Take 200 mg by mouth every 6 (six) hours as needed.       ALLERGIES:  Allergies as of 08/15/2011 - Review Complete 08/15/2011  Allergen Reaction Noted  .  Penicillins Itching 08/05/2011     FAMILY HISTORY:  History reviewed. No pertinent family history.   SOCIAL HISTORY: History   Social History  . Marital Status: Single    Spouse Name: N/A    Number of Children: N/A  . Years of Education: N/A   Social History Main Topics  . Smoking status: Current Everyday Smoker -- 0.5 packs/day  . Smokeless tobacco: None  . Alcohol Use: Yes  . Drug Use: No  . Sexually Active:    Other Topics Concern  . None   Social History Narrative  . None      Review of Systems  Constitutional: Negative for fatigue.  HENT: Negative for congestion, sinus pressure and ear discharge.   Eyes: Negative for discharge.  Respiratory: Negative for cough.   Cardiovascular: Negative for chest pain.  Gastrointestinal: Negative for abdominal pain and diarrhea.  Genitourinary: Negative for frequency and hematuria.  Musculoskeletal: Negative for back pain.  Skin: Negative for rash.  Neurological: Negative for seizures and headaches.  Hematological: Negative.   Psychiatric/Behavioral: Negative for hallucinations.    Physical Exam  BP 132/86  Pulse 92  Temp 98.3 F (36.8 C)  Resp 20  Ht 5\' 10"  (1.778 m)  Wt 205 lb (92.987 kg)  BMI 29.41 kg/m2  SpO2 100%  Physical Exam  Nursing note and vitals reviewed. Constitutional: He is oriented to person, place, and time. He appears well-developed and well-nourished. No distress.  HENT:  Head: Normocephalic and  atraumatic.  Eyes: Conjunctivae and EOM are normal. No scleral icterus.  Neck: Neck supple.  Cardiovascular: Normal rate and regular rhythm.  Exam reveals no gallop and no friction rub.   No murmur heard. Pulmonary/Chest: Effort normal and breath sounds normal. He has no wheezes. He has no rales.  Abdominal: Soft. He exhibits no distension.  Musculoskeletal: Normal range of motion. He exhibits no edema.       Mild pain with ROM of left shoulder.   Neurological: He is alert and oriented to person,  place, and time. Coordination normal.  Skin: Skin is warm and dry.  Psychiatric: He has a normal mood and affect. His behavior is normal.    ED Course  Procedures  OTHER DATA REVIEWED: Nursing notes, vital signs, and past medical records reviewed. Lab results reviewed and considered Imaging results reviewed and considered Previous Medical Records reviewed and considered  Recent ED Visits Patient was evaluated in ED on 08/08/2011 for left shoulder pain by Marisa Sprinkles and Dr. Rubin Payor. Patient was also seen on 08/05/2011 for left shoulder pain with a DG Shoulder 2+ View X-Ray completed (Report below). LEFT SHOULDER - 2+ VIEW  Comparison: None.  Findings: Mild degenerative change in the acromioclavicular joint.  No evidence of acute fracture or subluxation. No focal bone  lesions. Bone matrix and cortex appear intact. No abnormal  radiopaque densities in the soft tissues.  IMPRESSION:  No acute bony abnormalities identified.  Original Report Authenticated By: Marlon Pel, M.D.  DIAGNOSTIC STUDIES: Oxygen Saturation is 100% on room air, normal by my interpretation.    LABS / RADIOLOGY:  Dg Cervical Spine Complete  08/15/2011  *RADIOLOGY REPORT*  Clinical Data: Neck pain, no trauma  CERVICAL SPINE - COMPLETE 4+ VIEW  Comparison: CT cervical spine of 06/09/2009  Findings: The cervical vertebrae are within normal limits in alignment.  There is degenerative disc disease from C3-C6 with some loss of disc space and minimal sclerosis.  No prevertebral soft tissue swelling is seen.  On oblique views the foramina are patent. The odontoid process is intact.  The lung apices are clear.  IMPRESSION: Normal alignment with degenerative disc disease from C3-C6.  No acute abnormality.  Original Report Authenticated By: Juline Patch, M.D.    PROCEDURES:  ED COURSE / COORDINATION OF CARE: Orders Placed This Encounter  Procedures  . DG Cervical Spine Complete  8:24AM- Patient informed of  imaging results and arthritic changes in neck. Explained plan to rx anti-inflammatory medications to help alleviate pain. Informed of intent to d/c home. Patient agrees with plan set forth at this time.   MDM: Differential Diagnosis: cervicle radiculapathy   PLAN: Discharge home The patient is to return the emergency department if there is any worsening of symptoms. I have reviewed the discharge instructions with the patient/family  CONDITION ON DISCHARGE: Stable   MEDICATIONS GIVEN IN THE E.D.  Medications  naproxen (NAPROSYN) 500 MG tablet (not administered)      The chart was scribed for me under my direct supervision.  I personally performed the history, physical, and medical decision making and all procedures in the evaluation of this patient.Benny Lennert, MD 08/15/11 [phone]

## 2011-08-15 NOTE — ED Notes (Signed)
MD at bedside. 

## 2011-08-15 NOTE — ED Notes (Signed)
Pt c/o left shoulder pain x 3 weeks. Pt seen in ed 08/07/11 for same-xray done, pain medication given. Pt states pain is not any better. Denies sob/n/v. nad noted.

## 2011-10-02 LAB — URINALYSIS, ROUTINE W REFLEX MICROSCOPIC
Bilirubin Urine: NEGATIVE
Glucose, UA: NEGATIVE
Hgb urine dipstick: NEGATIVE
Ketones, ur: 15 — AB
Nitrite: NEGATIVE
Protein, ur: NEGATIVE
Specific Gravity, Urine: >1.03 — ABNORMAL HIGH
Urobilinogen, UA: 0.2
pH: 6

## 2011-10-06 ENCOUNTER — Emergency Department (HOSPITAL_COMMUNITY)
Admission: EM | Admit: 2011-10-06 | Discharge: 2011-10-07 | Disposition: A | Discharge status: Home / Self Care | Payer: Medicaid Other | Attending: Emergency Medicine | Admitting: Emergency Medicine

## 2011-10-06 ENCOUNTER — Encounter (HOSPITAL_COMMUNITY): Payer: Self-pay | Admitting: *Deleted

## 2011-10-06 DIAGNOSIS — E876 Hypokalemia: Secondary | ICD-10-CM | POA: Insufficient documentation

## 2011-10-06 DIAGNOSIS — R112 Nausea with vomiting, unspecified: Secondary | ICD-10-CM | POA: Insufficient documentation

## 2011-10-06 DIAGNOSIS — F17200 Nicotine dependence, unspecified, uncomplicated: Secondary | ICD-10-CM | POA: Insufficient documentation

## 2011-10-06 DIAGNOSIS — K219 Gastro-esophageal reflux disease without esophagitis: Secondary | ICD-10-CM | POA: Insufficient documentation

## 2011-10-06 DIAGNOSIS — J40 Bronchitis, not specified as acute or chronic: Secondary | ICD-10-CM | POA: Insufficient documentation

## 2011-10-06 DIAGNOSIS — R109 Unspecified abdominal pain: Secondary | ICD-10-CM | POA: Insufficient documentation

## 2011-10-06 DIAGNOSIS — R10811 Right upper quadrant abdominal tenderness: Secondary | ICD-10-CM | POA: Insufficient documentation

## 2011-10-06 DIAGNOSIS — H6190 Disorder of external ear, unspecified, unspecified ear: Secondary | ICD-10-CM | POA: Insufficient documentation

## 2011-10-06 DIAGNOSIS — E871 Hypo-osmolality and hyponatremia: Secondary | ICD-10-CM

## 2011-10-06 DIAGNOSIS — H9390 Unspecified disorder of ear, unspecified ear: Secondary | ICD-10-CM

## 2011-10-06 LAB — LIPASE, BLOOD: Lipase: 32 U/L (ref 11–59)

## 2011-10-06 LAB — DIFFERENTIAL
Basophils Absolute: 0.1 10*3/uL (ref 0.0–0.1)
Basophils Relative: 1 % (ref 0–1)
Eosinophils Absolute: 0.1 10*3/uL (ref 0.0–0.7)
Eosinophils Relative: 2 % (ref 0–5)
Lymphocytes Relative: 51 % — ABNORMAL HIGH (ref 12–46)
Lymphs Abs: 2.5 10*3/uL (ref 0.7–4.0)
Monocytes Absolute: 0.6 10*3/uL (ref 0.1–1.0)
Monocytes Relative: 12 % (ref 3–12)
Neutro Abs: 1.7 10*3/uL (ref 1.7–7.7)
Neutrophils Relative %: 34 % — ABNORMAL LOW (ref 43–77)

## 2011-10-06 LAB — CBC
HCT: 37.2 % — ABNORMAL LOW (ref 39.0–52.0)
Hemoglobin: 12.5 g/dL — ABNORMAL LOW (ref 13.0–17.0)
MCH: 32.4 pg (ref 26.0–34.0)
MCHC: 33.6 g/dL (ref 30.0–36.0)
MCV: 96.4 fL (ref 78.0–100.0)
Platelets: 333 10*3/uL (ref 150–400)
RBC: 3.86 MIL/uL — ABNORMAL LOW (ref 4.22–5.81)
RDW: 15.6 % — ABNORMAL HIGH (ref 11.5–15.5)
WBC: 5 10*3/uL (ref 4.0–10.5)

## 2011-10-06 LAB — COMPREHENSIVE METABOLIC PANEL
ALT: 29 U/L (ref 0–53)
AST: 57 U/L — ABNORMAL HIGH (ref 0–37)
Albumin: 3.3 g/dL — ABNORMAL LOW (ref 3.5–5.2)
Alkaline Phosphatase: 58 U/L (ref 39–117)
BUN: 15 mg/dL (ref 6–23)
CO2: 26 mEq/L (ref 19–32)
Calcium: 8.6 mg/dL (ref 8.4–10.5)
Chloride: 97 mEq/L (ref 96–112)
Creatinine, Ser: 0.78 mg/dL (ref 0.50–1.35)
GFR calc Af Amer: >90 mL/min (ref >90)
GFR calc non Af Amer: >90 mL/min (ref >90)
Glucose, Bld: 87 mg/dL (ref 70–99)
Potassium: 3.1 mEq/L — ABNORMAL LOW (ref 3.5–5.1)
Sodium: 134 mEq/L — ABNORMAL LOW (ref 135–145)
Total Bilirubin: 0.6 mg/dL (ref 0.3–1.2)
Total Protein: 7 g/dL (ref 6.0–8.3)

## 2011-10-06 MED ORDER — POTASSIUM CHLORIDE ER 10 MEQ PO TBCR: 10.0000 meq | EXTENDED_RELEASE_TABLET | Freq: Two times a day (BID) | ORAL | Status: DC

## 2011-10-06 MED ORDER — BACITRACIN ZINC 500 UNIT/GM EX OINT
TOPICAL_OINTMENT | Freq: Two times a day (BID) | CUTANEOUS | Status: DC
  Administered 2011-10-06: 1 via TOPICAL
  Filled 2011-10-06: qty 0.9

## 2011-10-06 MED ORDER — SODIUM CHLORIDE 0.9 % IV SOLN: INTRAVENOUS | Status: DC

## 2011-10-06 MED ORDER — SODIUM CHLORIDE 0.9 % IV BOLUS (SEPSIS)
500.0000 mL | Freq: Once | INTRAVENOUS | Status: AC
  Administered 2011-10-06: 500 mL via INTRAVENOUS

## 2011-10-06 MED ORDER — POTASSIUM CHLORIDE CRYS ER 20 MEQ PO TBCR
40.0000 meq | EXTENDED_RELEASE_TABLET | Freq: Once | ORAL | Status: AC
  Administered 2011-10-06: 40 meq via ORAL
  Filled 2011-10-06: qty 2

## 2011-10-06 MED ORDER — ONDANSETRON 8 MG PO TBDP: 8.0000 mg | ORAL_TABLET | Freq: Three times a day (TID) | ORAL | Status: AC | PRN

## 2011-10-06 MED ORDER — ONDANSETRON HCL 4 MG/2ML IJ SOLN
4.0000 mg | Freq: Once | INTRAMUSCULAR | Status: AC
  Administered 2011-10-06: 4 mg via INTRAVENOUS
  Filled 2011-10-06: qty 2

## 2011-10-06 NOTE — ED Notes (Signed)
Pt c/o bilateral ear pain x 1wk. Pt also c/o lower abdominal pain, n/v, and decreased appetite since Friday.

## 2011-10-06 NOTE — ED Provider Notes (Addendum)
History  Scribed for Dr. Rubin Payor, the patient was seen in room APA07. The chart was scribed by Gilman Schmidt. The patients care was started at 2203 CSN: [id number] Arrival date & time: 10/06/2011  9:30 PM  Chief Complaint  Patient presents with  . Otalgia  . Nausea  . Emesis  . Abdominal Pain     HPI [name] [name] is a 50 y.o. male who presents to the Emergency Department complaining of emesis. Associated symptoms of  nausea, diarrhea, and lower abdominal pain onset two days. Pt also c/o of bilateral ear pain x 1 weak. Denies any fever or difficulty hearing. There are no other associated symptoms and no other alleviating or aggravating factors.     Past Medical History  Diagnosis Date  . Bronchitis   . Acid reflux     Past Surgical History  Procedure Date  . Nasal suregery     Family History  Problem Relation Age of Onset  . Diabetes Father     History  Substance Use Topics  . Smoking status: Current Everyday Smoker -- 0.5 packs/day  . Smokeless tobacco: Not on file  . Alcohol Use: Yes      Review of Systems  Constitutional: Positive for appetite change. Negative for fever.  HENT: Positive for ear pain. Negative for hearing loss.   Gastrointestinal: Positive for nausea, vomiting, abdominal pain and diarrhea.    Allergies  Penicillins  Home Medications   Current Outpatient Rx  Name Route Sig Dispense Refill  . CYCLOBENZAPRINE HCL 10 MG PO TABS Oral Take 10 mg by mouth at bedtime as needed. For muscle relaxation     . HYDROCODONE-ACETAMINOPHEN 5-325 MG PO TABS  One po QID prn pain 12 tablet 0  . IBUPROFEN 200 MG PO TABS Oral Take 200 mg by mouth every 6 (six) hours as needed. For pain    . NAPROXEN 500 MG PO TABS Oral Take 1 tablet (500 mg total) by mouth 2 (two) times daily. 60 tablet 0    BP 137/85  Pulse 86  Temp(Src) 98.8 F (37.1 C) (Oral)  Resp 16  Ht 5\' 10"  (1.778 m)  Wt 191 lb 1.6 oz (86.682 kg)  BMI 27.42 kg/m2  SpO2 99%  Physical Exam    Constitutional: He is oriented to person, place, and time. He appears well-developed and well-nourished.  Non-toxic appearance. He does not have a sickly appearance.  HENT:  Head: Normocephalic and atraumatic.  Right Ear: Hearing and tympanic membrane normal.  Left Ear: Hearing and tympanic membrane normal.  Mouth/Throat: No posterior oropharyngeal edema or posterior oropharyngeal erythema.       Pinna small superficial reddened   Eyes: Conjunctivae, EOM and lids are normal. Pupils are equal, round, and reactive to light.  Neck: Trachea normal, normal range of motion and full passive range of motion without pain. Neck supple.  Cardiovascular: Regular rhythm and normal heart sounds.   Pulmonary/Chest: Effort normal and breath sounds normal. No respiratory distress.  Abdominal: Soft. Normal appearance. He exhibits no distension. There is tenderness (mild) in the right upper quadrant. There is no rebound, no guarding and no CVA tenderness.  Musculoskeletal: Normal range of motion.  Neurological: He is alert and oriented to person, place, and time. He has normal strength.  Skin: Skin is warm, dry and intact. No rash noted.    ED Course  Procedures  DIAGNOSTIC STUDIES: Oxygen Saturation is 99% on room air, normal by my interpretation.    COORDINATION OF CARE: 2203:  -  Patient evaluated by ED physician, Zofran, labs ordered  Results for orders placed during the hospital encounter of 10/06/11  CBC      Component Value Range   WBC 5.0  4.0 - 10.5 (K/uL)   RBC 3.86 (*) 4.22 - 5.81 (MIL/uL)   Hemoglobin 12.5 (*) 13.0 - 17.0 (g/dL)   HCT 16.1 (*) 09.6 - 52.0 (%)   MCV 96.4  78.0 - 100.0 (fL)   MCH 32.4  26.0 - 34.0 (pg)   MCHC 33.6  30.0 - 36.0 (g/dL)   RDW 04.5 (*) 40.9 - 15.5 (%)   Platelets 333  150 - 400 (K/uL)  DIFFERENTIAL      Component Value Range   Neutrophils Relative 34 (*) 43 - 77 (%)   Lymphocytes Relative 51 (*) 12 - 46 (%)   Monocytes Relative 12  3 - 12 (%)    Eosinophils Relative 2  0 - 5 (%)   Basophils Relative 1  0 - 1 (%)   Neutro Abs 1.7  1.7 - 7.7 (K/uL)   Lymphs Abs 2.5  0.7 - 4.0 (K/uL)   Monocytes Absolute 0.6  0.1 - 1.0 (K/uL)   Eosinophils Absolute 0.1  0.0 - 0.7 (K/uL)   Basophils Absolute 0.1  0.0 - 0.1 (K/uL)  COMPREHENSIVE METABOLIC PANEL      Component Value Range   Sodium 134 (*) 135 - 145 (mEq/L)   Potassium 3.1 (*) 3.5 - 5.1 (mEq/L)   Chloride 97  96 - 112 (mEq/L)   CO2 26  19 - 32 (mEq/L)   Glucose, Bld 87  70 - 99 (mg/dL)   BUN 15  6 - 23 (mg/dL)   Creatinine, Ser 8.11  0.50 - 1.35 (mg/dL)   Calcium 8.6  8.4 - 91.4 (mg/dL)   Total Protein 7.0  6.0 - 8.3 (g/dL)   Albumin 3.3 (*) 3.5 - 5.2 (g/dL)   AST 36  0 - 37 (U/L)   ALT <5  0 - 53 (U/L)   Alkaline Phosphatase 58  39 - 117 (U/L)   Total Bilirubin 0.6  0.3 - 1.2 (mg/dL)   GFR calc non Af Amer >90  >90 (mL/min)   GFR calc Af Amer >90  >90 (mL/min)  LIPASE, BLOOD      Component Value Range   Lipase 32  11 - 59 (U/L)     MDM  Patient came in for nausea vomiting and abdominal pain. He states the pain since Friday. No diarrhea or constipation. His mild hyponatremia mild hypokalemia he is feeling much better after treatment. When tolerating orals. He is eager to go home. He was orally supplemented and potassium he'll be discharged home with Zofran and potassium only follow up with his doctor. He'll be given bacitracin for the small area his ear.  I personally performed the services described in this documentation, which was scribed in my presence. The recorded information has been reviewed and considered.          Juliet Rude. Rubin Payor, MD 10/06/11 2348  Juliet Rude. Rubin Payor, MD 10/06/11 [phone]

## 2011-11-06 NOTE — ED Provider Notes (Signed)
Medical screening examination/treatment/procedure(s) were performed by non-physician practitioner and as supervising physician I was immediately available for consultation/collaboration.   Laray Anger, DO 11/06/11 1151

## 2011-11-06 NOTE — ED Provider Notes (Signed)
History     CSN: [id number] Arrival date & time: 08/05/2011  8:01 PM   None     Chief Complaint  Patient presents with  . Shoulder Pain    (Consider location/radiation/quality/duration/timing/severity/associated sxs/prior treatment) HPI  Past Medical History  Diagnosis Date  . Bronchitis   . Acid reflux     Past Surgical History  Procedure Date  . Nasal suregery     Family History  Problem Relation Age of Onset  . Diabetes Father     History  Substance Use Topics  . Smoking status: Current Everyday Smoker -- 0.5 packs/day  . Smokeless tobacco: Not on file  . Alcohol Use: Yes      Review of Systems  Allergies  Penicillins  Home Medications   Current Outpatient Rx  Name Route Sig Dispense Refill  . CYCLOBENZAPRINE HCL 10 MG PO TABS Oral Take 10 mg by mouth at bedtime as needed. For muscle relaxation     . IBUPROFEN 200 MG PO TABS Oral Take 200 mg by mouth every 6 (six) hours as needed. For pain    . HYDROCODONE-ACETAMINOPHEN 5-325 MG PO TABS  One po QID prn pain 12 tablet 0  . IBUPROFEN 800 MG PO TABS Oral Take 800 mg by mouth every 8 (eight) hours as needed. pain     . NAPROXEN 500 MG PO TABS Oral Take 1 tablet (500 mg total) by mouth 2 (two) times daily. 60 tablet 0  . OMEPRAZOLE 20 MG PO CPDR Oral Take 20 mg by mouth daily.      Marland Kitchen POTASSIUM CHLORIDE CR 10 MEQ PO TBCR Oral Take 1 tablet (10 mEq total) by mouth 2 (two) times daily. 10 tablet 0    BP 114/78  Pulse 89  Temp(Src) 98.3 F (36.8 C) (Oral)  Resp 20  Ht 5' 10.5" (1.791 m)  Wt 202 lb (91.627 kg)  BMI 28.57 kg/m2  SpO2 99%  Physical Exam  ED Course  Procedures (including critical care time)  Labs Reviewed - No data to display No results found.   1. Joint pain in the shoulder/clavicle region       MDM          Worthy Rancher, PA 11/06/11 1143

## 2011-12-05 ENCOUNTER — Emergency Department (HOSPITAL_COMMUNITY): Payer: Medicare Other

## 2011-12-05 ENCOUNTER — Inpatient Hospital Stay (HOSPITAL_COMMUNITY)
Admission: EM | Admit: 2011-12-05 | Discharge: 2011-12-08 | DRG: 440 | Disposition: A | Discharge status: Home / Self Care | Payer: Medicare Other | Attending: Internal Medicine | Admitting: Internal Medicine

## 2011-12-05 ENCOUNTER — Encounter (HOSPITAL_COMMUNITY): Payer: Self-pay | Admitting: Emergency Medicine

## 2011-12-05 DIAGNOSIS — K859 Acute pancreatitis without necrosis or infection, unspecified: Principal | ICD-10-CM | POA: Diagnosis present

## 2011-12-05 DIAGNOSIS — E781 Pure hyperglyceridemia: Secondary | ICD-10-CM | POA: Diagnosis present

## 2011-12-05 DIAGNOSIS — K5900 Constipation, unspecified: Secondary | ICD-10-CM | POA: Diagnosis present

## 2011-12-05 DIAGNOSIS — E876 Hypokalemia: Secondary | ICD-10-CM | POA: Diagnosis not present

## 2011-12-05 DIAGNOSIS — F1010 Alcohol abuse, uncomplicated: Secondary | ICD-10-CM | POA: Diagnosis present

## 2011-12-05 HISTORY — DX: Acute pancreatitis without necrosis or infection, unspecified: K85.90

## 2011-12-05 LAB — COMPREHENSIVE METABOLIC PANEL
ALT: 17 U/L (ref 0–53)
AST: 44 U/L — ABNORMAL HIGH (ref 0–37)
Albumin: 3.4 g/dL — ABNORMAL LOW (ref 3.5–5.2)
Alkaline Phosphatase: 53 U/L (ref 39–117)
BUN: 12 mg/dL (ref 6–23)
CO2: 24 mEq/L (ref 19–32)
Calcium: 9.3 mg/dL (ref 8.4–10.5)
Chloride: 101 mEq/L (ref 96–112)
Creatinine, Ser: 0.73 mg/dL (ref 0.50–1.35)
GFR calc Af Amer: >90 mL/min (ref >90)
GFR calc non Af Amer: >90 mL/min (ref >90)
Glucose, Bld: 87 mg/dL (ref 70–99)
Potassium: 3.5 mEq/L (ref 3.5–5.1)
Sodium: 137 mEq/L (ref 135–145)
Total Bilirubin: 0.4 mg/dL (ref 0.3–1.2)
Total Protein: 7.7 g/dL (ref 6.0–8.3)

## 2011-12-05 LAB — DIFFERENTIAL
Basophils Absolute: 0 10*3/uL (ref 0.0–0.1)
Basophils Relative: 0 % (ref 0–1)
Eosinophils Absolute: 0.1 10*3/uL (ref 0.0–0.7)
Eosinophils Relative: 2 % (ref 0–5)
Lymphocytes Relative: 16 % (ref 12–46)
Lymphs Abs: 1.1 10*3/uL (ref 0.7–4.0)
Monocytes Absolute: 0.4 10*3/uL (ref 0.1–1.0)
Monocytes Relative: 6 % (ref 3–12)
Neutro Abs: 5.1 10*3/uL (ref 1.7–7.7)
Neutrophils Relative %: 76 % (ref 43–77)

## 2011-12-05 LAB — URINALYSIS, ROUTINE W REFLEX MICROSCOPIC
Bilirubin Urine: NEGATIVE
Glucose, UA: NEGATIVE mg/dL
Hgb urine dipstick: NEGATIVE
Leukocytes, UA: NEGATIVE
Nitrite: NEGATIVE
Protein, ur: NEGATIVE mg/dL
Specific Gravity, Urine: 1.02 (ref 1.005–1.030)
Urobilinogen, UA: 0.2 mg/dL (ref 0.0–1.0)
pH: 6 (ref 5.0–8.0)

## 2011-12-05 LAB — CBC
HCT: 41.1 % (ref 39.0–52.0)
Hemoglobin: 13.7 g/dL (ref 13.0–17.0)
MCH: 32.2 pg (ref 26.0–34.0)
MCHC: 33.3 g/dL (ref 30.0–36.0)
MCV: 96.7 fL (ref 78.0–100.0)
Platelets: 291 10*3/uL (ref 150–400)
RBC: 4.25 MIL/uL (ref 4.22–5.81)
RDW: 13.5 % (ref 11.5–15.5)
WBC: 6.7 10*3/uL (ref 4.0–10.5)

## 2011-12-05 LAB — LIPASE, BLOOD: Lipase: 890 U/L — ABNORMAL HIGH (ref 11–59)

## 2011-12-05 MED ORDER — POTASSIUM CHLORIDE IN NACL 40-0.9 MEQ/L-% IV SOLN
INTRAVENOUS | Status: AC
  Filled 2011-12-05: qty 1000

## 2011-12-05 MED ORDER — TRAZODONE HCL 50 MG PO TABS
100.0000 mg | ORAL_TABLET | Freq: Every day | ORAL | Status: DC
  Administered 2011-12-05 – 2011-12-07 (×3): 100 mg via ORAL
  Filled 2011-12-05 (×3): qty 2

## 2011-12-05 MED ORDER — PNEUMOCOCCAL VAC POLYVALENT 25 MCG/0.5ML IJ INJ
0.5000 mL | INJECTION | INTRAMUSCULAR | Status: AC
  Administered 2011-12-06: 0.5 mL via INTRAMUSCULAR
  Filled 2011-12-05: qty 0.5

## 2011-12-05 MED ORDER — HYDROMORPHONE HCL PF 1 MG/ML IJ SOLN
1.0000 mg | Freq: Once | INTRAMUSCULAR | Status: AC
  Administered 2011-12-05: 1 mg via INTRAVENOUS
  Filled 2011-12-05: qty 1

## 2011-12-05 MED ORDER — THIAMINE HCL 100 MG/ML IJ SOLN
Freq: Once | INTRAVENOUS | Status: AC
  Administered 2011-12-05: 18:00:00 via INTRAVENOUS
  Filled 2011-12-05: qty 1000

## 2011-12-05 MED ORDER — THIAMINE HCL 100 MG/ML IJ SOLN
INTRAMUSCULAR | Status: AC
  Filled 2011-12-05: qty 2

## 2011-12-05 MED ORDER — VITAMIN B-1 100 MG PO TABS
100.0000 mg | ORAL_TABLET | Freq: Every day | ORAL | Status: DC
  Administered 2011-12-06 – 2011-12-08 (×3): 100 mg via ORAL
  Filled 2011-12-05 (×3): qty 1

## 2011-12-05 MED ORDER — FOLIC ACID 1 MG PO TABS
1.0000 mg | ORAL_TABLET | Freq: Every day | ORAL | Status: DC
  Administered 2011-12-06 – 2011-12-08 (×3): 1 mg via ORAL
  Filled 2011-12-05 (×3): qty 1

## 2011-12-05 MED ORDER — ONDANSETRON HCL 4 MG/2ML IJ SOLN
4.0000 mg | Freq: Once | INTRAMUSCULAR | Status: AC
  Administered 2011-12-05: 4 mg via INTRAVENOUS
  Filled 2011-12-05: qty 2

## 2011-12-05 MED ORDER — SODIUM CHLORIDE 0.9 % IV BOLUS (SEPSIS)
1000.0000 mL | Freq: Once | INTRAVENOUS | Status: AC
  Administered 2011-12-05: 1000 mL via INTRAVENOUS

## 2011-12-05 MED ORDER — FOLIC ACID 5 MG/ML IJ SOLN
INTRAMUSCULAR | Status: AC
  Filled 2011-12-05: qty 0.2

## 2011-12-05 MED ORDER — M.V.I. ADULT IV INJ
INJECTION | INTRAVENOUS | Status: AC
  Filled 2011-12-05: qty 10

## 2011-12-05 MED ORDER — LORAZEPAM 2 MG/ML IJ SOLN: 1.0000 mg | Freq: Four times a day (QID) | INTRAMUSCULAR | Status: DC | PRN

## 2011-12-05 MED ORDER — HYDROMORPHONE HCL PF 1 MG/ML IJ SOLN
1.0000 mg | INTRAMUSCULAR | Status: DC | PRN
  Administered 2011-12-05 – 2011-12-06 (×7): 1 mg via INTRAVENOUS
  Filled 2011-12-05 (×8): qty 1

## 2011-12-05 MED ORDER — INFLUENZA VIRUS VACC SPLIT PF IM SUSP
0.5000 mL | INTRAMUSCULAR | Status: AC
  Administered 2011-12-06: 0.5 mL via INTRAMUSCULAR
  Filled 2011-12-05: qty 0.5

## 2011-12-05 MED ORDER — PANTOPRAZOLE SODIUM 40 MG PO TBEC
40.0000 mg | DELAYED_RELEASE_TABLET | Freq: Every day | ORAL | Status: DC
  Administered 2011-12-05 – 2011-12-08 (×4): 40 mg via ORAL
  Filled 2011-12-05 (×3): qty 1

## 2011-12-05 MED ORDER — TAMSULOSIN HCL 0.4 MG PO CAPS
0.4000 mg | ORAL_CAPSULE | Freq: Every day | ORAL | Status: DC
  Administered 2011-12-05 – 2011-12-07 (×3): 0.4 mg via ORAL
  Filled 2011-12-05 (×4): qty 1

## 2011-12-05 MED ORDER — LORAZEPAM 1 MG PO TABS: 1.0000 mg | ORAL_TABLET | Freq: Four times a day (QID) | ORAL | Status: DC | PRN

## 2011-12-05 MED ORDER — PROMETHAZINE HCL 12.5 MG PO TABS: 12.5000 mg | ORAL_TABLET | Freq: Four times a day (QID) | ORAL | Status: DC | PRN

## 2011-12-05 MED ORDER — ALUM & MAG HYDROXIDE-SIMETH 200-200-20 MG/5ML PO SUSP: 30.0000 mL | Freq: Four times a day (QID) | ORAL | Status: DC | PRN

## 2011-12-05 MED ORDER — PROMETHAZINE HCL 25 MG/ML IJ SOLN: 12.5000 mg | Freq: Four times a day (QID) | INTRAMUSCULAR | Status: DC | PRN

## 2011-12-05 MED ORDER — SODIUM CHLORIDE 0.9 % IJ SOLN
INTRAMUSCULAR | Status: AC
  Administered 2011-12-05: 10 mL
  Filled 2011-12-05: qty 3

## 2011-12-05 MED ORDER — THERA M PLUS PO TABS
1.0000 | ORAL_TABLET | Freq: Every day | ORAL | Status: DC
  Administered 2011-12-06 – 2011-12-08 (×3): 1 via ORAL
  Filled 2011-12-05 (×3): qty 1

## 2011-12-05 MED ORDER — SODIUM CHLORIDE 0.9 % IV SOLN
INTRAVENOUS | Status: AC
  Administered 2011-12-05: 14:00:00 via INTRAVENOUS

## 2011-12-05 MED ORDER — THIAMINE HCL 100 MG/ML IJ SOLN: 100.0000 mg | Freq: Every day | INTRAMUSCULAR | Status: DC

## 2011-12-05 MED ORDER — SENNA 8.6 MG PO TABS
1.0000 | ORAL_TABLET | Freq: Two times a day (BID) | ORAL | Status: DC
  Administered 2011-12-05 – 2011-12-08 (×6): 8.6 mg via ORAL
  Filled 2011-12-05 (×6): qty 1

## 2011-12-05 NOTE — ED Provider Notes (Signed)
History     CSN: [id number] Arrival date & time: 12/05/2011  6:22 AM   First MD Initiated Contact with Patient 12/05/11 [phone]      Chief Complaint  Patient presents with  . Abdominal Pain  . Emesis    (Consider location/radiation/quality/duration/timing/severity/associated sxs/prior treatment) HPI Comments: 50 year old male who presents with abdominal pain, mild nausea  The patient was admitted to the hospital in March of this year and diagnosed with acute pancreatitis thought to be related to alcohol abuse and hypertriglyceridemia. He states that last night after drinking a couple of beers he developed midabdominal pain which he describes as achy, 8/10 and gradually worsening through the night. He denies vomiting but does have some nausea. Symptoms are constant, worse with palpation. He states it is similar to the pain that he had in March of this year. There is no swelling, edema, rash, diarrhea, dysuria, back pain, headache. He does admit to having bilateral ear pain and dry skin around the ears which has been present for 5 years.  Patient is a 50 y.o. male presenting with abdominal pain and vomiting. The history is provided by the patient, a relative and medical records.  Abdominal Pain The primary symptoms of the illness include abdominal pain and vomiting.  Emesis  Associated symptoms include abdominal pain.    Past Medical History  Diagnosis Date  . Bronchitis   . Acid reflux   . Pancreatitis     Past Surgical History  Procedure Date  . Nasal suregery     Family History  Problem Relation Age of Onset  . Diabetes Father     History  Substance Use Topics  . Smoking status: Current Everyday Smoker -- 0.5 packs/day  . Smokeless tobacco: Not on file  . Alcohol Use: Yes     Daily      Review of Systems  Gastrointestinal: Positive for vomiting and abdominal pain.  All other systems reviewed and are negative.    Allergies  Penicillins  Home Medications    Current Outpatient Rx  Name Route Sig Dispense Refill  . CYCLOBENZAPRINE HCL 10 MG PO TABS Oral Take 10 mg by mouth at bedtime as needed. For muscle relaxation     . HYDROCODONE-ACETAMINOPHEN 5-325 MG PO TABS  One po QID prn pain 12 tablet 0  . IBUPROFEN 200 MG PO TABS Oral Take 200 mg by mouth every 6 (six) hours as needed. For pain    . IBUPROFEN 800 MG PO TABS Oral Take 800 mg by mouth every 8 (eight) hours as needed. pain     . NAPROXEN 500 MG PO TABS Oral Take 1 tablet (500 mg total) by mouth 2 (two) times daily. 60 tablet 0  . OMEPRAZOLE 20 MG PO CPDR Oral Take 20 mg by mouth daily.      Marland Kitchen POTASSIUM CHLORIDE CR 10 MEQ PO TBCR Oral Take 1 tablet (10 mEq total) by mouth 2 (two) times daily. 10 tablet 0    BP 140/89  Pulse 89  Temp(Src) 98.3 F (36.8 C) (Oral)  Resp 16  Ht 5\' 10"  (1.778 m)  Wt 193 lb (87.544 kg)  BMI 27.69 kg/m2  SpO2 100%  Physical Exam  Nursing note and vitals reviewed. Constitutional: He appears well-developed and well-nourished. No distress.  HENT:  Head: Normocephalic and atraumatic.  Mouth/Throat: Oropharynx is clear and moist. No oropharyngeal exudate.  Eyes: Conjunctivae and EOM are normal. Pupils are equal, round, and reactive to light. Right eye exhibits no  discharge. Left eye exhibits no discharge. No scleral icterus.  Neck: Normal range of motion. Neck supple. No JVD present. No thyromegaly present.  Cardiovascular: Normal rate, regular rhythm, normal heart sounds and intact distal pulses.  Exam reveals no gallop and no friction rub.   No murmur heard. Pulmonary/Chest: Effort normal and breath sounds normal. No respiratory distress. He has no wheezes. He has no rales.  Abdominal: Soft. Bowel sounds are normal. He exhibits no distension and no mass. There is tenderness ( Periumbilical and epigastric tenderness with guarding, no masses, no peritoneal signs, no tenderness in the right lower quadrant, suprapubic or left lower quadrant.).   Musculoskeletal: Normal range of motion. He exhibits no edema and no tenderness.  Lymphadenopathy:    He has no cervical adenopathy.  Neurological: He is alert. Coordination normal.  Skin: Skin is warm and dry. No rash noted. No erythema.  Psychiatric: He has a normal mood and affect. His behavior is normal.    ED Course  Procedures (including critical care time)   Labs Reviewed  CBC  DIFFERENTIAL  COMPREHENSIVE METABOLIC PANEL  LIPASE, BLOOD  URINALYSIS, ROUTINE W REFLEX MICROSCOPIC   No results found.   No diagnosis found.    MDM  No prior abdominal surgery but with significant history of prior pancreatitis related to alcohol use and hypertriglyceridemia. Vital signs appear normal but has moderate tenderness on exam. He is non-peritoneal at this time. We'll proceed with laboratory workup including lipase, cooperative metabolic panel, CBC, urinalysis. Intravenous hydromorphone ordered.      Change of shift - care signed out to Dr. Cory Roughen, MD 12/05/11 [phone]

## 2011-12-05 NOTE — ED Notes (Signed)
Pt being transported at this time to 336 via nurse tech.

## 2011-12-05 NOTE — H&P (Signed)
Hospital Admission Note Date: 12/05/2011  Patient name: [name] [name] Medical record number: [id number] Date of birth: Oct 27, 1961 Age: 50 y.o. Gender: male PCP: No primary provider on file.  Attending physician: Christiane Ha  Chief Complaint:   History of Present Illness:  [name] is an 50 y.o. male with a previous history of pancreatitis related to alcohol and hypertriglyceridemia. He has been taking gemfibrozil intermittently, and continuing to drink. He is vague about those. He presents after worsening epigastric pain, nausea and vomiting starting last night. He drank several beers and several glasses of liquor last night. He's had no hematemesis. He feels better, but still having pain. He is able to tolerate some liquid.  Past Medical History  Diagnosis Date  . Bronchitis   . Acid reflux   . Pancreatitis     Meds: Prescriptions prior to admission  Medication Sig Dispense Refill  . fish oil-omega-3 fatty acids 1000 MG capsule Take 1 g by mouth daily.        Marland Kitchen ibuprofen (ADVIL,MOTRIN) 800 MG tablet Take 800 mg by mouth every 8 (eight) hours as needed. pain       . omeprazole (PRILOSEC) 20 MG capsule Take 20 mg by mouth daily.        . traZODone (DESYREL) 100 MG tablet Take 100 mg by mouth at bedtime.        . potassium chloride (K-DUR) 10 MEQ tablet Take 1 tablet (10 mEq total) by mouth 2 (two) times daily.  10 tablet  0    Allergies: Penicillins History   Social History  . Marital Status: Single    Spouse Name: N/A    Number of Children: N/A  . Years of Education: N/A   Occupational History  . Not on file.   Social History Main Topics  . Smoking status: Current Everyday Smoker -- 0.5 packs/day  . Smokeless tobacco: Not on file  . Alcohol Use: Yes     Daily  . Drug Use: No  . Sexually Active:    Other Topics Concern  . Not on file   Social History Narrative  . No narrative on file   Family History  Problem Relation Age of Onset  . Diabetes Father      Past Surgical History  Procedure Date  . Nasal suregery     Review of Systems: Systems reviewed and as per HPI, otherwise negative.  Physical Exam: Blood pressure 123/80, pulse 83, temperature 98.1 F (36.7 C), temperature source Oral, resp. rate 20, height 5\' 10"  (1.778 m), weight 88 kg (194 lb 0.1 oz), SpO2 96.00%. BP 123/80  Pulse 83  Temp(Src) 98.1 F (36.7 C) (Oral)  Resp 20  Ht 5\' 10"  (1.778 m)  Wt 88 kg (194 lb 0.1 oz)  BMI 27.84 kg/m2  SpO2 96%  General Appearance:    Alert, cooperative, no distress, appears stated age. Watching TV.   Head:    Normocephalic, without obvious abnormality, atraumatic  Eyes:    PERRL, conjunctiva/corneas clear, EOM's intact, fundi    benign, both eyes          Nose:   Nares normal, septum midline, mucosa normal, no drainage    or sinus tenderness  Throat:   Lips, mucosa, and tongue normal; teeth and gums normal  Neck:   Supple, symmetrical, trachea midline, no adenopathy;       thyroid:  No enlargement/tenderness/nodules; no carotid   bruit or JVD  Back:     Symmetric, no curvature, ROM  normal, no CVA tenderness  Lungs:     Clear to auscultation bilaterally, respirations unlabored  Chest wall:    No tenderness or deformity  Heart:    Regular rate and rhythm, S1 and S2 normal, no murmur, rub   or gallop  Abdomen:     Soft, mild epigastric tenderness, bowel sounds active all four quadrants,    no masses, no organomegaly  Genitalia:   deferred   Rectal:   deferred   Extremities:   Extremities normal, atraumatic, no cyanosis or edema  Pulses:   2+ and symmetric all extremities  Skin:   Skin color, texture, turgor normal, no rashes or lesions  Lymph nodes:   Cervical, supraclavicular, and axillary nodes normal  Neurologic:   CNII-XII intact. Normal strength, sensation and reflexes      throughout    Psychiatric:  Normal affect. Calm and cooperative.  Lab results: Basic Metabolic Panel:  [facility] 12/05/11 0645  NA 137  K 3.5  CL  101  CO2 24  GLUCOSE 87  BUN 12  CREATININE 0.73  CALCIUM 9.3  MG --  PHOS --   Liver Function Tests:  [facility] 12/05/11 0645  AST 44*  ALT 17  ALKPHOS 53  BILITOT 0.4  PROT 7.7  ALBUMIN 3.4*    Basename 12/05/11 0645  LIPASE 890*  AMYLASE --   No results found for this basename: AMMONIA:2 in the last 72 hours CBC:  Basename 12/05/11 0645  WBC 6.7  NEUTROABS 5.1  HGB 13.7  HCT 41.1  MCV 96.7  PLT 291   Cardiac Enzymes: No results found for this basename: CKTOTAL:3,CKMB:3,CKMBINDEX:3,TROPONINI:3 in the last 72 hours BNP: No components found with this basename: POCBNP:3 D-Dimer: No results found for this basename: DDIMER:2 in the last 72 hours CBG: No results found for this basename: GLUCAP:6 in the last 72 hours Hemoglobin A1C: No results found for this basename: HGBA1C in the last 72 hours Fasting Lipid Panel: No results found for this basename: CHOL,HDL,LDLCALC,TRIG,CHOLHDL,LDLDIRECT in the last 72 hours Thyroid Function Tests: No results found for this basename: TSH,T4TOTAL,FREET4,T3FREE,THYROIDAB in the last 72 hours Anemia Panel: No results found for this basename: VITAMINB12,FOLATE,FERRITIN,TIBC,IRON,RETICCTPCT in the last 72 hours Coagulation: No results found for this basename: LABPROT:2,INR:2 in the last 72 hours Urine Drug Screen: Drugs of Abuse     Component Value Date/Time   LABOPIA NONE DETECTED 06/09/2009 1845   COCAINSCRNUR POSITIVE* 06/09/2009 1845   LABBENZ NONE DETECTED 06/09/2009 1845   AMPHETMU NONE DETECTED 06/09/2009 1845   THCU NONE DETECTED 06/09/2009 1845   LABBARB  Value: NONE DETECTED        DRUG SCREEN FOR MEDICAL PURPOSES ONLY.  IF CONFIRMATION IS NEEDED FOR ANY PURPOSE, NOTIFY LAB WITHIN 5 DAYS.        LOWEST DETECTABLE LIMITS FOR URINE DRUG SCREEN Drug Class       Cutoff (ng/mL) Amphetamine      1000 Barbiturate      200 Benzodiazepine   200 Tricyclics       300 Opiates          300 Cocaine          300 THC              50  06/09/2009 1845    Alcohol Level: No results found for this basename: ETH:2 in the last 72 hours  Urinalysis: YELLOW APPearance CLEAR Specific Gravity, Urine 1.020 pH 6.0 Glucose, UA NEGATIVE Bilirubin Urine NEGATIVE Ketones, ur TRACE Protein, ur NEGATIVE  Urobilinogen, UA 0.2 Nitrite NEGATIVE Leukocytes, UA NEGA   Imaging results:  Dg Abd Acute W/chest  12/05/2011  *RADIOLOGY REPORT*  Clinical Data: Pain.  History of pancreatitis.  ACUTE ABDOMEN SERIES (ABDOMEN 2 VIEW & CHEST 1 VIEW)  Comparison: 03/14/2011  Findings: There are low lung volumes with bibasilar atelectasis. Heart is normal size.  No effusions.  There is a nonobstructive bowel gas pattern.  Moderate stool within the right side of the colon.  No free air.  No organomegaly or suspicious calcification.  Rounded calcifications in the anatomic pelvis are most compatible with phleboliths.  No acute bony abnormality.  IMPRESSION: No obstruction or free air.  Low lung volumes, bibasilar atelectasis.  Original Report Authenticated By: Cyndie Chime, M.D.    Assessment & Plan: Principal Problem:  *Acute pancreatitis Active Problems:  Hypertriglyceridemia  probable alcohol abuse  Patient appears to be feeling better already. Will continue clears, IV fluid, pain medication and antiemetics. Reassess in the morning. Place him on alcohol withdrawal precautions. He needs to quit drinking.  Breana Litts L 12/05/2011, 6:28 PM

## 2011-12-05 NOTE — ED Notes (Signed)
Pt reporting pain across middle portion of abdomen.  Reports mild nausea. Denies vomiting or problems with bowel movements.  No acute distress noted.

## 2011-12-05 NOTE — ED Provider Notes (Signed)
Coordination of Care  12/05/11 8:55 AM  Benny Lennert, MD to patient bedside for recheck  Patient feeling better at this time, with pain improvement, but still experiencing epigastric pain and is tender in epigastrium. 12:01 PM: Physician performed recheck.Pt reports continued epigastric pain. Admit for pancreatitis  Benny Lennert, MD 12/05/11 1250

## 2011-12-05 NOTE — ED Notes (Signed)
Patient complaining of lower abdominal pain starting last night with nausea and vomiting. Also complaining of bilateral ear pain.

## 2011-12-06 LAB — LIPASE, BLOOD: Lipase: 497 U/L — ABNORMAL HIGH (ref 11–59)

## 2011-12-06 LAB — TRIGLYCERIDES: Triglycerides: 263 mg/dL — ABNORMAL HIGH (ref <150)

## 2011-12-06 MED ORDER — HYDROMORPHONE HCL PF 1 MG/ML IJ SOLN
1.0000 mg | INTRAMUSCULAR | Status: DC | PRN
  Administered 2011-12-06 – 2011-12-07 (×8): 2 mg via INTRAVENOUS
  Filled 2011-12-06 (×8): qty 2

## 2011-12-06 MED ORDER — SODIUM CHLORIDE 0.9 % IJ SOLN
3.0000 mL | Freq: Two times a day (BID) | INTRAMUSCULAR | Status: DC
  Administered 2011-12-06: 3 mL via INTRAVENOUS
  Administered 2011-12-06: 10 mL via INTRAVENOUS
  Administered 2011-12-07 (×2): 3 mL via INTRAVENOUS
  Administered 2011-12-07: 10 mL via INTRAVENOUS
  Administered 2011-12-08: 3 mL via INTRAVENOUS
  Filled 2011-12-06 (×4): qty 3

## 2011-12-06 MED ORDER — SODIUM CHLORIDE 0.9 % IV SOLN
250.0000 mL | INTRAVENOUS | Status: DC | PRN
  Administered 2011-12-06: 250 mL via INTRAVENOUS

## 2011-12-06 MED ORDER — SODIUM CHLORIDE 0.9 % IJ SOLN: 3.0000 mL | INTRAMUSCULAR | Status: DC | PRN

## 2011-12-06 MED ORDER — SODIUM CHLORIDE 0.9 % IJ SOLN
INTRAMUSCULAR | Status: AC
  Administered 2011-12-07: 02:00:00
  Filled 2011-12-06: qty 3

## 2011-12-06 MED ORDER — SODIUM CHLORIDE 0.9 % IJ SOLN
INTRAMUSCULAR | Status: AC
  Administered 2011-12-06: 10 mL via INTRAVENOUS
  Filled 2011-12-06: qty 3

## 2011-12-06 NOTE — Progress Notes (Signed)
UR Chart Review Completed  

## 2011-12-06 NOTE — Progress Notes (Signed)
Subjective: Still with severe pain.  Tolerating clears.  No N/V. Feels constipated Objective: Vital signs in last 24 hours: Filed Vitals:   12/06/11 0001 12/06/11 0622 12/06/11 1016 12/06/11 1413  BP: 135/85 127/82 146/85 138/77  Pulse: 67 93 94 98  Temp: 97.6 F (36.4 C) 98.2 F (36.8 C) 98.1 F (36.7 C) 98.2 F (36.8 C)  TempSrc: Oral Oral Oral Oral  Resp: 18 20 18 18   Height: 6\' 1"  (1.854 m)     Weight: 84.505 kg (186 lb 4.8 oz)     SpO2: 93% 96% 95% 93%   Weight change: 0.456 kg (1 lb 0.1 oz)  Intake/Output Summary (Last 24 hours) at 12/06/11 1631 Last data filed at 12/06/11 1244  Gross per 24 hour  Intake    303 ml  Output      0 ml  Net    303 ml   Physical Exam: Exam unchanged from 12/05/11 Lab Results: Basic Metabolic Panel:  Lab 12/05/11 1610  NA 137  K 3.5  CL 101  CO2 24  GLUCOSE 87  BUN 12  CREATININE 0.73  CALCIUM 9.3  MG --  PHOS --   Liver Function Tests:  Lab 12/05/11 0645  AST 44*  ALT 17  ALKPHOS 53  BILITOT 0.4  PROT 7.7  ALBUMIN 3.4*    Lab 12/06/11 0512 12/05/11 0645  LIPASE 497* 890*  AMYLASE -- --   No results found for this basename: AMMONIA:2 in the last 168 hours CBC:  Lab 12/05/11 0645  WBC 6.7  NEUTROABS 5.1  HGB 13.7  HCT 41.1  MCV 96.7  PLT 291   Cardiac Enzymes: No results found for this basename: CKTOTAL:3,CKMB:3,CKMBINDEX:3,TROPONINI:3 in the last 168 hours BNP: No components found with this basename: POCBNP:3 D-Dimer: No results found for this basename: DDIMER:2 in the last 168 hours CBG: No results found for this basename: GLUCAP:6 in the last 168 hours Hemoglobin A1C: No results found for this basename: HGBA1C in the last 168 hours Fasting Lipid Panel:  Lab 12/06/11 0513  CHOL --  HDL --  LDLCALC --  TRIG 263*  CHOLHDL --  LDLDIRECT --     Micro Results: No results found for this or any previous visit (from the past 240 hour(s)). Studies/Results: Dg Abd Acute W/chest  12/05/2011   *RADIOLOGY REPORT*  Clinical Data: Pain.  History of pancreatitis.  ACUTE ABDOMEN SERIES (ABDOMEN 2 VIEW & CHEST 1 VIEW)  Comparison: 03/14/2011  Findings: There are low lung volumes with bibasilar atelectasis. Heart is normal size.  No effusions.  There is a nonobstructive bowel gas pattern.  Moderate stool within the right side of the colon.  No free air.  No organomegaly or suspicious calcification.  Rounded calcifications in the anatomic pelvis are most compatible with phleboliths.  No acute bony abnormality.  IMPRESSION: No obstruction or free air.  Low lung volumes, bibasilar atelectasis.  Original Report Authenticated By: Cyndie Chime, M.D.   Scheduled Meds:   . sodium chloride   Intravenous STAT  . folic acid  1 mg Oral Daily  . influenza  inactive virus vaccine  0.5 mL Intramuscular Tomorrow-1000  . multivitamins ther. w/minerals  1 tablet Oral Daily  . pantoprazole  40 mg Oral Q1200  . pneumococcal 23 valent vaccine  0.5 mL Intramuscular Tomorrow-1000  . senna  1 tablet Oral BID  . general admission iv infusion   Intravenous Once  . sodium chloride  3 mL Intravenous Q12H  . Tamsulosin HCl  0.4 mg Oral QHS  . thiamine  100 mg Oral Daily   Or  . thiamine  100 mg Intravenous Daily  . traZODone  100 mg Oral QHS   Continuous Infusions:  PRN Meds:.sodium chloride, alum & mag hydroxide-simeth, HYDROmorphone (DILAUDID) injection, LORazepam, LORazepam, promethazine, promethazine, sodium chloride, DISCONTD:  HYDROmorphone (DILAUDID) injection Assessment/Plan: Principal Problem:  *Acute pancreatitis Active Problems:  Hypertriglyceridemia Alcohol abuse  Continue clears for now.  Increase dilaudid to 1-2 mg every 2 hours as needed. No evidence of DTs.   LOS: 1 day   Phi Avans L 12/06/2011, 4:31 PM

## 2011-12-06 NOTE — Progress Notes (Signed)
CSW assessed pt due to ETOH abuse.  Full note and SBIRT in shadow chart.  Pt plans to follow up with [facility] and AA meetings for treatment.  MD aware.    [name] [name]

## 2011-12-07 MED ORDER — OXYCODONE HCL 5 MG PO TABS
5.0000 mg | ORAL_TABLET | ORAL | Status: DC | PRN
  Administered 2011-12-07 (×2): 5 mg via ORAL
  Filled 2011-12-07 (×2): qty 1

## 2011-12-07 MED ORDER — MAGNESIUM CITRATE PO SOLN
1.0000 | Freq: Once | ORAL | Status: AC
  Administered 2011-12-07: 1 via ORAL
  Filled 2011-12-07: qty 296

## 2011-12-07 MED ORDER — SODIUM CHLORIDE 0.9 % IJ SOLN
INTRAMUSCULAR | Status: AC
  Administered 2011-12-07: 10 mL via INTRAVENOUS
  Filled 2011-12-07: qty 3

## 2011-12-07 MED ORDER — HYDROMORPHONE HCL PF 1 MG/ML IJ SOLN
1.0000 mg | INTRAMUSCULAR | Status: DC | PRN
  Administered 2011-12-07 – 2011-12-08 (×7): 2 mg via INTRAVENOUS
  Filled 2011-12-07 (×7): qty 2

## 2011-12-07 NOTE — Progress Notes (Signed)
Pain worse. Couldn't tolerate solid food. Will downgrade diet. Not ready for discharge

## 2011-12-08 ENCOUNTER — Inpatient Hospital Stay (HOSPITAL_COMMUNITY): Payer: Medicare Other

## 2011-12-08 LAB — CBC
HCT: 37.8 % — ABNORMAL LOW (ref 39.0–52.0)
Hemoglobin: 12.7 g/dL — ABNORMAL LOW (ref 13.0–17.0)
MCH: 32.7 pg (ref 26.0–34.0)
MCHC: 33.6 g/dL (ref 30.0–36.0)
MCV: 97.4 fL (ref 78.0–100.0)
Platelets: 248 10*3/uL (ref 150–400)
RBC: 3.88 MIL/uL — ABNORMAL LOW (ref 4.22–5.81)
RDW: 13.7 % (ref 11.5–15.5)
WBC: 6.4 10*3/uL (ref 4.0–10.5)

## 2011-12-08 LAB — COMPREHENSIVE METABOLIC PANEL
ALT: 10 U/L (ref 0–53)
AST: 24 U/L (ref 0–37)
Albumin: 3.3 g/dL — ABNORMAL LOW (ref 3.5–5.2)
Alkaline Phosphatase: 63 U/L (ref 39–117)
BUN: 4 mg/dL — ABNORMAL LOW (ref 6–23)
CO2: 32 mEq/L (ref 19–32)
Calcium: 9.3 mg/dL (ref 8.4–10.5)
Chloride: 90 mEq/L — ABNORMAL LOW (ref 96–112)
Creatinine, Ser: 0.73 mg/dL (ref 0.50–1.35)
GFR calc Af Amer: >90 mL/min (ref >90)
GFR calc non Af Amer: >90 mL/min (ref >90)
Glucose, Bld: 98 mg/dL (ref 70–99)
Potassium: 3.2 mEq/L — ABNORMAL LOW (ref 3.5–5.1)
Sodium: 129 mEq/L — ABNORMAL LOW (ref 135–145)
Total Bilirubin: 0.4 mg/dL (ref 0.3–1.2)
Total Protein: 7.8 g/dL (ref 6.0–8.3)

## 2011-12-08 LAB — DIFFERENTIAL
Basophils Absolute: 0 10*3/uL (ref 0.0–0.1)
Basophils Relative: 0 % (ref 0–1)
Eosinophils Absolute: 0.1 10*3/uL (ref 0.0–0.7)
Eosinophils Relative: 1 % (ref 0–5)
Lymphocytes Relative: 10 % — ABNORMAL LOW (ref 12–46)
Lymphs Abs: 0.6 10*3/uL — ABNORMAL LOW (ref 0.7–4.0)
Monocytes Absolute: 0.6 10*3/uL (ref 0.1–1.0)
Monocytes Relative: 9 % (ref 3–12)
Neutro Abs: 5.1 10*3/uL (ref 1.7–7.7)
Neutrophils Relative %: 80 % — ABNORMAL HIGH (ref 43–77)

## 2011-12-08 LAB — AMYLASE: Amylase: 55 U/L (ref 0–105)

## 2011-12-08 LAB — LIPASE, BLOOD: Lipase: 96 U/L — ABNORMAL HIGH (ref 11–59)

## 2011-12-08 MED ORDER — OXYCODONE HCL 5 MG PO TABS: 5.0000 mg | ORAL_TABLET | ORAL | Status: AC | PRN

## 2011-12-08 MED ORDER — PROMETHAZINE HCL 12.5 MG PO TABS: 25.0000 mg | ORAL_TABLET | Freq: Four times a day (QID) | ORAL | Status: DC | PRN

## 2011-12-08 MED ORDER — POTASSIUM CHLORIDE CRYS ER 20 MEQ PO TBCR
40.0000 meq | EXTENDED_RELEASE_TABLET | Freq: Once | ORAL | Status: AC
  Administered 2011-12-08: 40 meq via ORAL
  Filled 2011-12-08: qty 2

## 2011-12-08 NOTE — Progress Notes (Signed)
Patient discharged with instructions given on medications,and follow up visits,verbalized understanding.Prescriptions sent with patient.Accompanied by staff to an awaiting vehicle.

## 2011-12-08 NOTE — Progress Notes (Signed)
Subjective: Pain better. Constipated. Passing flatus. Feels bloated  Objective: Vital signs in last 24 hours: Filed Vitals:   12/07/11 0519 12/07/11 1400 12/07/11 2136 12/08/11 0522  BP: 138/91 137/86 160/96 157/106  Pulse: 97 92 98 105  Temp: 97.5 F (36.4 C) 97.9 F (36.6 C) 98.2 F (36.8 C) 97.4 F (36.3 C)  TempSrc: Oral Oral Oral Oral  Resp: 16 16 16 16   Height:      Weight: 95.3 kg (210 lb 1.6 oz)     SpO2: 94% 96% 95% 92%   Weight change:   Intake/Output Summary (Last 24 hours) at 12/08/11 0935 Last data filed at 12/07/11 1800  Gross per 24 hour  Intake    760 ml  Output      0 ml  Net    760 ml   Physical Exam: Exam unchanged from 12/05/11 Lab Results: Basic Metabolic Panel:  Lab 12/05/11 0454  NA 137  K 3.5  CL 101  CO2 24  GLUCOSE 87  BUN 12  CREATININE 0.73  CALCIUM 9.3  MG --  PHOS --   Liver Function Tests:  Lab 12/05/11 0645  AST 44*  ALT 17  ALKPHOS 53  BILITOT 0.4  PROT 7.7  ALBUMIN 3.4*    Lab 12/06/11 0512 12/05/11 0645  LIPASE 497* 890*  AMYLASE -- --   No results found for this basename: AMMONIA:2 in the last 168 hours CBC:  Lab 12/05/11 0645  WBC 6.7  NEUTROABS 5.1  HGB 13.7  HCT 41.1  MCV 96.7  PLT 291   Cardiac Enzymes: No results found for this basename: CKTOTAL:3,CKMB:3,CKMBINDEX:3,TROPONINI:3 in the last 168 hours BNP: No components found with this basename: POCBNP:3 D-Dimer: No results found for this basename: DDIMER:2 in the last 168 hours CBG: No results found for this basename: GLUCAP:6 in the last 168 hours Hemoglobin A1C: No results found for this basename: HGBA1C in the last 168 hours Fasting Lipid Panel:  Lab 12/06/11 0513  CHOL --  HDL --  LDLCALC --  TRIG 263*  CHOLHDL --  LDLDIRECT --     Micro Results: No results found for this or any previous visit (from the past 240 hour(s)). Studies/Results: No results found. Scheduled Meds:    . folic acid  1 mg Oral Daily  . magnesium  citrate  1 Bottle Oral Once  . multivitamins ther. w/minerals  1 tablet Oral Daily  . pantoprazole  40 mg Oral Q1200  . senna  1 tablet Oral BID  . sodium chloride  3 mL Intravenous Q12H  . Tamsulosin HCl  0.4 mg Oral QHS  . thiamine  100 mg Oral Daily  . traZODone  100 mg Oral QHS  . DISCONTD: thiamine  100 mg Intravenous Daily   Continuous Infusions:  PRN Meds:.sodium chloride, alum & mag hydroxide-simeth, HYDROmorphone (DILAUDID) injection, LORazepam, promethazine, promethazine, sodium chloride, DISCONTD:  HYDROmorphone (DILAUDID) injection, DISCONTD: LORazepam, DISCONTD: oxyCODONE Assessment/Plan: Principal Problem:  *Acute pancreatitis Active Problems:  Hypertriglyceridemia Alcohol abuse  advance diet change to po meds   LOS: 3 days   [name] L 12/08/2011, 9:35 AM

## 2011-12-08 NOTE — Progress Notes (Signed)
[facility] [address] [place] [address]  December 08, 2011  Patient: [name] [name]  Date of Birth: March 17, 1961  Date of Visit: 12/05/2011    To Whom It May Concern:  [name] [name] was seen and treated in our emergency department on 12/05/2011. Krist Rosenboom  may return to work on 12/15/11.  Sincerely,      Crista Curb, M.D.

## 2011-12-08 NOTE — Progress Notes (Signed)
Subjective: Pain better on iv dilaudid. No BM yet.  Tolerating clears  Objective: Vital signs in last 24 hours: Filed Vitals:   12/07/11 0519 12/07/11 1400 12/07/11 2136 12/08/11 0522  BP: 138/91 137/86 160/96 157/106  Pulse: 97 92 98 105  Temp: 97.5 F (36.4 C) 97.9 F (36.6 C) 98.2 F (36.8 C) 97.4 F (36.3 C)  TempSrc: Oral Oral Oral Oral  Resp: 16 16 16 16   Height:      Weight: 95.3 kg (210 lb 1.6 oz)     SpO2: 94% 96% 95% 92%   Weight change:   Intake/Output Summary (Last 24 hours) at 12/08/11 0940 Last data filed at 12/07/11 1800  Gross per 24 hour  Intake    760 ml  Output      0 ml  Net    760 ml   Physical Exam: Walking around. Lungs: Clear to auscultation bilaterally Cardiovascular regular rate rhythm without murmurs gallops rubs Abdomen diminished bowel sounds, more distended. Less tender. Extremities no clubbing cyanosis or edema  Lab Results: Basic Metabolic Panel:  Lab 12/05/11 8295  NA 137  K 3.5  CL 101  CO2 24  GLUCOSE 87  BUN 12  CREATININE 0.73  CALCIUM 9.3  MG --  PHOS --   Liver Function Tests:  Lab 12/05/11 0645  AST 44*  ALT 17  ALKPHOS 53  BILITOT 0.4  PROT 7.7  ALBUMIN 3.4*    Lab 12/06/11 0512 12/05/11 0645  LIPASE 497* 890*  AMYLASE -- --   No results found for this basename: AMMONIA:2 in the last 168 hours CBC:  Lab 12/05/11 0645  WBC 6.7  NEUTROABS 5.1  HGB 13.7  HCT 41.1  MCV 96.7  PLT 291   Cardiac Enzymes: No results found for this basename: CKTOTAL:3,CKMB:3,CKMBINDEX:3,TROPONINI:3 in the last 168 hours BNP: No components found with this basename: POCBNP:3 D-Dimer: No results found for this basename: DDIMER:2 in the last 168 hours CBG: No results found for this basename: GLUCAP:6 in the last 168 hours Hemoglobin A1C: No results found for this basename: HGBA1C in the last 168 hours Fasting Lipid Panel:  Lab 12/06/11 0513  CHOL --  HDL --  LDLCALC --  TRIG 263*  CHOLHDL --  LDLDIRECT --      Micro Results: No results found for this or any previous visit (from the past 240 hour(s)). Studies/Results: No results found. Scheduled Meds:    . folic acid  1 mg Oral Daily  . magnesium citrate  1 Bottle Oral Once  . multivitamins ther. w/minerals  1 tablet Oral Daily  . pantoprazole  40 mg Oral Q1200  . senna  1 tablet Oral BID  . sodium chloride  3 mL Intravenous Q12H  . Tamsulosin HCl  0.4 mg Oral QHS  . thiamine  100 mg Oral Daily  . traZODone  100 mg Oral QHS  . DISCONTD: thiamine  100 mg Intravenous Daily   Continuous Infusions:  PRN Meds:.sodium chloride, alum & mag hydroxide-simeth, HYDROmorphone (DILAUDID) injection, LORazepam, promethazine, promethazine, sodium chloride, DISCONTD:  HYDROmorphone (DILAUDID) injection, DISCONTD: LORazepam, DISCONTD: oxyCODONE Assessment/Plan: Principal Problem:  *Acute pancreatitis Active Problems:  Hypertriglyceridemia Alcohol abuse  Tach x-ray of the abdomen to rule out ileus or obstruction. Repeat labs and pancreatic enzymes.   LOS: 3 days   Maegan Buller L 12/08/2011, 9:40 AM

## 2011-12-08 NOTE — Discharge Summary (Signed)
Physician Discharge Summary  Patient ID: Airrion Otting MRN: [id number] DOB/AGE: [birth year] 50 y.o.  Admit date: 12/05/2011 Discharge date: 12/08/2011  Discharge Diagnoses:  Principal Problem:  *Acute pancreatitis Active Problems:  Hypertriglyceridemia Hypokalemia Alcohol abuse. Constipation  Current Discharge Medication List    START taking these medications   Details  oxyCODONE (ROXICODONE) 5 MG immediate release tablet Take 1-2 tablets (5-10 mg total) by mouth every 4 (four) hours as needed for pain. Qty: 30 tablet, Refills: 0    promethazine (PHENERGAN) 12.5 MG tablet Take 2 tablets (25 mg total) by mouth every 6 (six) hours as needed for nausea. Qty: 10 tablet, Refills: 0      CONTINUE these medications which have NOT CHANGED   Details  fish oil-omega-3 fatty acids 1000 MG capsule Take 1 g by mouth daily.      ibuprofen (ADVIL,MOTRIN) 800 MG tablet Take 800 mg by mouth every 8 (eight) hours as needed. pain     omeprazole (PRILOSEC) 20 MG capsule Take 20 mg by mouth daily.      traZODone (DESYREL) 100 MG tablet Take 100 mg by mouth at bedtime.      potassium chloride (K-DUR) 10 MEQ tablet Take 1 tablet (10 mEq total) by mouth 2 (two) times daily. Qty: 10 tablet, Refills: 0      STOP taking these medications     cyclobenzaprine (FLEXERIL) 10 MG tablet      HYDROcodone-acetaminophen (NORCO) 5-325 MG per tablet      naproxen (NAPROSYN) 500 MG tablet         Discharge Orders    Future Orders Please Complete By Expires   Diet - low sodium heart healthy      Discharge instructions      Comments:   Avoid alcohol   Activity as tolerated - No restrictions      Other Restrictions      Comments:   Return to work in one week   Driving Restrictions      Comments:   No driving while on pain medications   Call MD for:  persistant nausea and vomiting      Call MD for:  severe uncontrolled pain         Follow-up Information    Follow up with [facility]. (If symptoms worsen)    Contact information:   [address] [facility] [place] [address] [phone]          Disposition: Home or Self Care  Discharged Condition:   Consults:    Labs:   Results for orders placed during the hospital encounter of 12/05/11 (from the past 48 hour(s))  COMPREHENSIVE METABOLIC PANEL     Status: Abnormal   Collection Time   12/08/11 10:07 AM      Component Value Range Comment   Sodium 129 (*) 135 - 145 (mEq/L)    Potassium 3.2 (*) 3.5 - 5.1 (mEq/L)    Chloride 90 (*) 96 - 112 (mEq/L)    CO2 32  19 - 32 (mEq/L)    Glucose, Bld 98  70 - 99 (mg/dL)    BUN 4 (*) 6 - 23 (mg/dL)    Creatinine, Ser 2.13  0.50 - 1.35 (mg/dL)    Calcium 9.3  8.4 - 10.5 (mg/dL)    Total Protein 7.8  6.0 - 8.3 (g/dL)    Albumin 3.3 (*) 3.5 - 5.2 (g/dL)    AST 24  0 - 37 (U/L)    ALT 10  0 - 53 (U/L)    Alkaline Phosphatase 63  39 - 117 (U/L)    Total Bilirubin 0.4  0.3 - 1.2 (mg/dL)    GFR calc non Af Amer >90  >90 (mL/min)    GFR calc Af Amer >90  >90 (mL/min)   LIPASE, BLOOD     Status: Abnormal   Collection Time   12/08/11 10:07 AM      Component Value Range Comment   Lipase 96 (*) 11 - 59 (U/L)   AMYLASE     Status: Normal   Collection Time   12/08/11 10:07 AM      Component Value Range Comment   Amylase 55  0 - 105 (U/L)   CBC     Status: Abnormal   Collection Time   12/08/11 10:07 AM      Component Value Range Comment   WBC 6.4  4.0 - 10.5 (K/uL)    RBC 3.88 (*) 4.22 - 5.81 (MIL/uL)    Hemoglobin 12.7 (*) 13.0 - 17.0 (g/dL)    HCT 16.1 (*) 09.6 - 52.0 (%)    MCV 97.4  78.0 - 100.0 (fL)    MCH 32.7  26.0 - 34.0 (pg)    MCHC 33.6  30.0 - 36.0 (g/dL)    RDW 04.5  40.9 - 81.1 (%)    Platelets 248  150 - 400 (K/uL)   DIFFERENTIAL     Status: Abnormal   Collection Time   12/08/11 10:07 AM      Component Value Range Comment   Neutrophils Relative 80 (*) 43 - 77 (%)    Neutro Abs 5.1  1.7 - 7.7 (K/uL)    Lymphocytes Relative 10 (*) 12 - 46  (%)    Lymphs Abs 0.6 (*) 0.7 - 4.0 (K/uL)    Monocytes Relative 9  3 - 12 (%)    Monocytes Absolute 0.6  0.1 - 1.0 (K/uL)    Eosinophils Relative 1  0 - 5 (%)    Eosinophils Absolute 0.1  0.0 - 0.7 (K/uL)    Basophils Relative 0  0 - 1 (%)    Basophils Absolute 0.0  0.0 - 0.1 (K/uL)     Diagnostics:  Dg Abd 2 Views  12/08/2011  *RADIOLOGY REPORT*  Clinical Data: Pancreatitis, evaluate for ileus or obstruction  ABDOMEN - 2 VIEW  Comparison: 12/05/2011; abdominal CT - 03/26/2011  Findings:  Increased now moderate gaseous distension of the colon with index loop of transverse colon measuring approximately 7.2 cm in diameter. No dilated loops of small bowel.  No pneumoperitoneum, pneumatosis or portal venous gas.  Peculiar serpiginous lucency along the right lower abdominal quadrant is favored to be external to the patient.  Limited visualization of the lower thorax demonstrates mild elevation left hemidiaphragm with left basilar opacities, possibly atelectasis.  No acute osseous abnormalities.  IMPRESSION:  Increased, now moderate, nonspecific gaseous distension of the colon without gaseous distension of the small bowel.  Original Report Authenticated By: Waynard Reeds, M.D.   Dg Abd Acute W/chest  12/05/2011  *RADIOLOGY REPORT*  Clinical Data: Pain.  History of pancreatitis.  ACUTE ABDOMEN SERIES (ABDOMEN 2 VIEW & CHEST 1 VIEW)  Comparison: 03/14/2011  Findings: There are low lung volumes with bibasilar atelectasis. Heart is normal size.  No effusions.  There is a nonobstructive bowel gas pattern.  Moderate stool within the right side of the colon.  No free air.  No organomegaly or suspicious calcification.  Rounded calcifications in  the anatomic pelvis are most compatible with phleboliths.  No acute bony abnormality.  IMPRESSION: No obstruction or free air.  Low lung volumes, bibasilar atelectasis.  Original Report Authenticated By: Cyndie Chime, M.D.   Full Code   Hospital Course: See H&P for  complete admission history. The patient is a 50 year old black male with previous history of pancreatitis felt to be due to both alcohol and hypertriglyceridemia. He has been compliant with gemfibrozil and diet, but continues to drink. He presented with epigastric pain and vomiting. His lipase was elevated. His triglyceride level he had normal vital signs on admission. Mild epigastric tenderness. He was admitted and started on IV fluids, pain medication, clear liquid diet. At this point, he continues to have pain, but he is requesting discharge. He's had no vomiting while here. His lipase has decreased. He is constipated but today's abdominal films show no obstruction. Social work was consult in to assist with alcohol abuse issues. Patient voices understanding that he should abstain from alcohol. He reports that he will followup with [facility], outpatient and 12 step meetings. Total time on the day of discharge is greater than 30 minutes. Please see progress note from earlier today regarding physical examination.    SignedChristiane Ha 12/08/2011, 3:33 PM

## 2011-12-09 ENCOUNTER — Emergency Department (HOSPITAL_COMMUNITY)
Admission: EM | Admit: 2011-12-09 | Discharge: 2011-12-09 | Discharge status: Against Medical Advice | Payer: Medicaid Other | Attending: Emergency Medicine | Admitting: Emergency Medicine

## 2011-12-09 DIAGNOSIS — Z0389 Encounter for observation for other suspected diseases and conditions ruled out: Secondary | ICD-10-CM | POA: Insufficient documentation

## 2011-12-09 NOTE — ED Notes (Signed)
Called third time for triage.  No response 

## 2011-12-09 NOTE — ED Notes (Signed)
Called once for triage.  No response 

## 2011-12-09 NOTE — ED Notes (Signed)
Pt called second time for triage

## 2011-12-10 ENCOUNTER — Emergency Department (HOSPITAL_COMMUNITY)
Admission: EM | Admit: 2011-12-10 | Discharge: 2011-12-10 | Disposition: A | Discharge status: Home / Self Care | Payer: Medicaid Other | Attending: Emergency Medicine | Admitting: Emergency Medicine

## 2011-12-10 ENCOUNTER — Encounter (HOSPITAL_COMMUNITY): Payer: Self-pay | Admitting: Emergency Medicine

## 2011-12-10 ENCOUNTER — Emergency Department (HOSPITAL_COMMUNITY): Payer: Medicaid Other

## 2011-12-10 DIAGNOSIS — K859 Acute pancreatitis without necrosis or infection, unspecified: Secondary | ICD-10-CM

## 2011-12-10 DIAGNOSIS — R110 Nausea: Secondary | ICD-10-CM | POA: Insufficient documentation

## 2011-12-10 DIAGNOSIS — R109 Unspecified abdominal pain: Secondary | ICD-10-CM | POA: Insufficient documentation

## 2011-12-10 DIAGNOSIS — K219 Gastro-esophageal reflux disease without esophagitis: Secondary | ICD-10-CM | POA: Insufficient documentation

## 2011-12-10 DIAGNOSIS — K5900 Constipation, unspecified: Secondary | ICD-10-CM | POA: Insufficient documentation

## 2011-12-10 DIAGNOSIS — F1010 Alcohol abuse, uncomplicated: Secondary | ICD-10-CM | POA: Insufficient documentation

## 2011-12-10 DIAGNOSIS — F17200 Nicotine dependence, unspecified, uncomplicated: Secondary | ICD-10-CM | POA: Insufficient documentation

## 2011-12-10 LAB — COMPREHENSIVE METABOLIC PANEL WITH GFR
ALT: 8 U/L (ref 0–53)
AST: 20 U/L (ref 0–37)
Albumin: 3.4 g/dL — ABNORMAL LOW (ref 3.5–5.2)
Alkaline Phosphatase: 74 U/L (ref 39–117)
BUN: 4 mg/dL — ABNORMAL LOW (ref 6–23)
CO2: 29 meq/L (ref 19–32)
Calcium: 9.7 mg/dL (ref 8.4–10.5)
Chloride: 95 meq/L — ABNORMAL LOW (ref 96–112)
Creatinine, Ser: 0.77 mg/dL (ref 0.50–1.35)
GFR calc Af Amer: >90 mL/min
GFR calc non Af Amer: >90 mL/min
Glucose, Bld: 111 mg/dL — ABNORMAL HIGH (ref 70–99)
Potassium: 3.6 meq/L (ref 3.5–5.1)
Sodium: 134 meq/L — ABNORMAL LOW (ref 135–145)
Total Bilirubin: 0.4 mg/dL (ref 0.3–1.2)
Total Protein: 8.6 g/dL — ABNORMAL HIGH (ref 6.0–8.3)

## 2011-12-10 LAB — LIPASE, BLOOD: Lipase: 146 U/L — ABNORMAL HIGH (ref 11–59)

## 2011-12-10 LAB — URINALYSIS, ROUTINE W REFLEX MICROSCOPIC
Bilirubin Urine: NEGATIVE
Glucose, UA: NEGATIVE mg/dL
Hgb urine dipstick: NEGATIVE
Ketones, ur: NEGATIVE mg/dL
Leukocytes, UA: NEGATIVE
Nitrite: NEGATIVE
Protein, ur: NEGATIVE mg/dL
Specific Gravity, Urine: 1.005 (ref 1.005–1.030)
Urobilinogen, UA: 0.2 mg/dL (ref 0.0–1.0)
pH: 7 (ref 5.0–8.0)

## 2011-12-10 LAB — DIFFERENTIAL
Basophils Absolute: 0 K/uL (ref 0.0–0.1)
Basophils Relative: 0 % (ref 0–1)
Eosinophils Absolute: 0.1 K/uL (ref 0.0–0.7)
Eosinophils Relative: 1 % (ref 0–5)
Lymphocytes Relative: 15 % (ref 12–46)
Lymphs Abs: 1.1 K/uL (ref 0.7–4.0)
Monocytes Absolute: 0.8 K/uL (ref 0.1–1.0)
Monocytes Relative: 10 % (ref 3–12)
Neutro Abs: 5.5 K/uL (ref 1.7–7.7)
Neutrophils Relative %: 74 % (ref 43–77)

## 2011-12-10 LAB — CBC
HCT: 41.7 % (ref 39.0–52.0)
Hemoglobin: 14.2 g/dL (ref 13.0–17.0)
MCH: 32.7 pg (ref 26.0–34.0)
MCHC: 34.1 g/dL (ref 30.0–36.0)
MCV: 96.1 fL (ref 78.0–100.0)
Platelets: 368 10*3/uL (ref 150–400)
RBC: 4.34 MIL/uL (ref 4.22–5.81)
RDW: 13.4 % (ref 11.5–15.5)
WBC: 7.5 10*3/uL (ref 4.0–10.5)

## 2011-12-10 LAB — ETHANOL: Alcohol, Ethyl (B): <11 mg/dL (ref 0–11)

## 2011-12-10 MED ORDER — IOHEXOL 300 MG/ML  SOLN
40.0000 mL | INTRAMUSCULAR | Status: AC
  Administered 2011-12-10: 40 mL via ORAL

## 2011-12-10 MED ORDER — SODIUM CHLORIDE 0.9 % IV BOLUS (SEPSIS)
1000.0000 mL | Freq: Once | INTRAVENOUS | Status: AC
  Administered 2011-12-10: 1000 mL via INTRAVENOUS

## 2011-12-10 MED ORDER — IOHEXOL 300 MG/ML  SOLN
100.0000 mL | Freq: Once | INTRAMUSCULAR | Status: AC | PRN
  Administered 2011-12-10: 100 mL via INTRAVENOUS

## 2011-12-10 MED ORDER — OXYCODONE-ACETAMINOPHEN 5-325 MG PO TABS: 2.0000 | ORAL_TABLET | ORAL | Status: DC | PRN

## 2011-12-10 MED ORDER — HYDROMORPHONE HCL PF 1 MG/ML IJ SOLN
1.0000 mg | Freq: Once | INTRAMUSCULAR | Status: AC
  Administered 2011-12-10: 1 mg via INTRAVENOUS
  Filled 2011-12-10: qty 1

## 2011-12-10 MED ORDER — ONDANSETRON HCL 4 MG/2ML IJ SOLN
4.0000 mg | Freq: Once | INTRAMUSCULAR | Status: AC
  Administered 2011-12-10: 4 mg via INTRAVENOUS
  Filled 2011-12-10: qty 2

## 2011-12-10 MED ORDER — OXYCODONE-ACETAMINOPHEN 5-325 MG PO TABS
2.0000 | ORAL_TABLET | Freq: Once | ORAL | Status: AC
  Administered 2011-12-10: 2 via ORAL
  Filled 2011-12-10: qty 2

## 2011-12-10 MED ORDER — POLYETHYLENE GLYCOL 3350 17 GM/SCOOP PO POWD: 17.0000 g | Freq: Every day | ORAL | Status: AC

## 2011-12-10 NOTE — ED Notes (Signed)
Patient medicated for pain per MD order. Patient stating "I don't think these will help, but I will take them" Patient given water for PO challenge.   Patient requesting to be prescribed Dilaudid "in pill form". Dr Manus Gunning made aware.

## 2011-12-10 NOTE — ED Notes (Signed)
Patient was admitted to hospital for pancreatitis and discharged Sunday. States he is no better and still having severe abdominal pain radiating into his back.

## 2011-12-10 NOTE — ED Notes (Signed)
Dr Manus Gunning at bedside for patient evaluation.

## 2011-12-10 NOTE — ED Notes (Signed)
Patient would like another pillow.

## 2011-12-10 NOTE — ED Notes (Signed)
Patient calling out to nurses station, stating he wants his iv out and that he has gotten dressed. RN in to room and told patient he needs to keep the IV in until the CT is done. Patient rolling eyes and stating that he has been here since 0430 today. States pain medication did not help at all and still rating pain at 9/10 on NPS.

## 2011-12-10 NOTE — ED Provider Notes (Signed)
History   This chart was scribed for Glynn Octave, MD by Clarita Crane. The patient was seen in room [facility]12/[facility]12 and the patient's care was started at 7:30am.   CSN: [id number] Arrival date & time: 12/10/2011  6:45 AM   First MD Initiated Contact with Patient 12/10/11 0720      Chief Complaint  Patient presents with  . Abdominal Pain  . Back Pain    (Consider location/radiation/quality/duration/timing/severity/associated sxs/prior treatment) HPI [name] [name] is a 50 y.o. male who presents to the Emergency Department complaining of constant moderate abdominal pain radiating to back described as similar to previous episodes of pancreatitis onset several days ago and persistent since with associated constipation. Patient reports he was admitted 5 days ago to hospital for pancreatitis and was d/c 2 days ago although he was still experiencing pain at time of d/c. Notes pain is not relieved with use of Hydrocodone. Denies fever, vomiting, chest pain, dysuria, hematuria. Reports last BM was 6 days ago.  Past Medical History  Diagnosis Date  . Bronchitis   . Acid reflux   . Pancreatitis     Past Surgical History  Procedure Date  . Nasal suregery     Family History  Problem Relation Age of Onset  . Diabetes Father     History  Substance Use Topics  . Smoking status: Current Everyday Smoker -- 0.5 packs/day  . Smokeless tobacco: Not on file  . Alcohol Use: Yes     Daily      Review of Systems 10 Systems reviewed and are negative for acute change except as noted in the HPI.  Allergies  Penicillins  Home Medications   Current Outpatient Rx  Name Route Sig Dispense Refill  . OMEGA-3 FATTY ACIDS 1000 MG PO CAPS Oral Take 1 g by mouth daily.      Marland Kitchen GEMFIBROZIL 600 MG PO TABS Oral Take 600 mg by mouth 2 (two) times daily before a meal.      . IBUPROFEN 800 MG PO TABS Oral Take 800 mg by mouth every 8 (eight) hours as needed. pain     . OMEPRAZOLE 20 MG PO CPDR Oral Take  20 mg by mouth daily.      . OXYCODONE HCL 5 MG PO TABS Oral Take 1-2 tablets (5-10 mg total) by mouth every 4 (four) hours as needed for pain. 30 tablet 0  . POTASSIUM CHLORIDE CR 10 MEQ PO TBCR Oral Take 1 tablet (10 mEq total) by mouth 2 (two) times daily. 10 tablet 0  . PROMETHAZINE HCL 12.5 MG PO TABS Oral Take 2 tablets (25 mg total) by mouth every 6 (six) hours as needed for nausea. 10 tablet 0  . TRAZODONE HCL 100 MG PO TABS Oral Take 100 mg by mouth at bedtime.      . OXYCODONE-ACETAMINOPHEN 5-325 MG PO TABS Oral Take 2 tablets by mouth every 4 (four) hours as needed for pain. 15 tablet 0  . POLYETHYLENE GLYCOL 3350 PO POWD Oral Take 17 g by mouth daily. 255 g 0    BP 143/98  Pulse 90  Temp(Src) 97.8 F (36.6 C) (Oral)  Resp 20  Ht 5\' 10"  (1.778 m)  Wt 199 lb (90.266 kg)  BMI 28.55 kg/m2  SpO2 98%  Physical Exam  Nursing note and vitals reviewed. Constitutional: He is oriented to person, place, and time. He appears well-developed and well-nourished. No distress.  HENT:  Head: Normocephalic and atraumatic.  Eyes: EOM are normal. Pupils are equal,  round, and reactive to light.  Neck: Neck supple. No tracheal deviation present.  Cardiovascular: Normal rate, regular rhythm, normal heart sounds and intact distal pulses.  Exam reveals no gallop and no friction rub.   No murmur heard. Pulmonary/Chest: Effort normal and breath sounds normal. No respiratory distress. He has no wheezes. He exhibits no tenderness.  Abdominal: Soft. He exhibits distension. There is tenderness (diffusely).       Firm abdomen. Diminished bowel sounds.   Musculoskeletal: Normal range of motion. He exhibits no edema.       Paraspinal tenderness from mid thoracic to lumbar region.   Neurological: He is alert and oriented to person, place, and time. No sensory deficit.  Skin: Skin is warm and dry.  Psychiatric: He has a normal mood and affect. His behavior is normal.    ED Course  Procedures (including  critical care time)  DIAGNOSTIC STUDIES: Oxygen Saturation is 99% on room air, normal by my interpretation.    COORDINATION OF CARE:  10:43am-Recheck: Patient informed of lab and imaging results. Informed of planned discharge.   Labs Reviewed  COMPREHENSIVE METABOLIC PANEL - Abnormal; Notable for the following:    Sodium 134 (*)    Chloride 95 (*)    Glucose, Bld 111 (*)    BUN 4 (*)    Total Protein 8.6 (*)    Albumin 3.4 (*)    All other components within normal limits  LIPASE, BLOOD - Abnormal; Notable for the following:    Lipase 146 (*)    All other components within normal limits  CBC  DIFFERENTIAL  URINALYSIS, ROUTINE W REFLEX MICROSCOPIC  ETHANOL   Ct Abdomen Pelvis W Contrast  12/10/2011  *RADIOLOGY REPORT*  Clinical Data: No pain and back pain.  History of pancreatitis.  CT ABDOMEN AND PELVIS WITH CONTRAST  Technique:  Multidetector CT imaging of the abdomen and pelvis was performed following the standard protocol during bolus administration of intravenous contrast.  Contrast: 40mL OMNIPAQUE IOHEXOL 300 MG/ML IV SOLN, OMNIPAQUE IOHEXOL 300 MG/ML IV SOLN  Comparison: Radiographs dated 06/09/2011 and CT scan dated 03/26/2011  Findings: The patient has acute pancreatitis involving the distal body and tail with two pseudocysts in the body, one measuring 15 mm in diameter and the other measuring 6 x 12 mm.  There is peripancreatic fluid.  The head and uncinate process of the pancreas are normal.  Liver, spleen, adrenal glands, and kidneys are normal.  Small amount of fluid in the left perinephric space.  The patient has a mobile cecum which lies in the mid abdomen. Bowel is otherwise normal.  No significant osseous abnormality.  IMPRESSION: Recurrent acute pancreatitis of the body and tail of the pancreas with pseudocyst formation and peripancreatic inflammation.  No visible pancreatic necrosis.  Original Report Authenticated By: Gwynn Burly, M.D.     1. Pancreatitis     2. Constipation       MDM  Abdominal pain radiating to the back similar to pancreatitis. Patient was admitted on December 13 and discharged yesterday. States he is feeling no better and still having nausea and pain. He Is using hydrocodone for pain is not a bowel movement in 6 days. His abdomen is firm, diffusely tender with diminished bowel sounds  CT scan findings consistent with acute pancreatitis and pseudocyst formation. Lipase elevated but decreased from previous. Patient tolerating by mouth in the ED. His Ranson score is 0. He is stable for discharge with a bowel regimen.  I personally  performed the services described in this documentation, which was scribed in my presence.  The recorded information has been reviewed and considered.   Glynn Octave, MD 12/10/11 [phone]

## 2011-12-17 ENCOUNTER — Encounter (HOSPITAL_COMMUNITY): Payer: Self-pay | Admitting: *Deleted

## 2011-12-17 ENCOUNTER — Emergency Department (HOSPITAL_COMMUNITY)
Admission: EM | Admit: 2011-12-17 | Discharge: 2011-12-17 | Disposition: A | Discharge status: Home / Self Care | Payer: Medicaid Other | Attending: Emergency Medicine | Admitting: Emergency Medicine

## 2011-12-17 DIAGNOSIS — M79609 Pain in unspecified limb: Secondary | ICD-10-CM | POA: Insufficient documentation

## 2011-12-17 DIAGNOSIS — Z8639 Personal history of other endocrine, nutritional and metabolic disease: Secondary | ICD-10-CM | POA: Insufficient documentation

## 2011-12-17 DIAGNOSIS — M25559 Pain in unspecified hip: Secondary | ICD-10-CM | POA: Insufficient documentation

## 2011-12-17 DIAGNOSIS — Z862 Personal history of diseases of the blood and blood-forming organs and certain disorders involving the immune mechanism: Secondary | ICD-10-CM | POA: Insufficient documentation

## 2011-12-17 DIAGNOSIS — J3489 Other specified disorders of nose and nasal sinuses: Secondary | ICD-10-CM | POA: Insufficient documentation

## 2011-12-17 DIAGNOSIS — R059 Cough, unspecified: Secondary | ICD-10-CM | POA: Insufficient documentation

## 2011-12-17 DIAGNOSIS — R509 Fever, unspecified: Secondary | ICD-10-CM | POA: Insufficient documentation

## 2011-12-17 DIAGNOSIS — R05 Cough: Secondary | ICD-10-CM | POA: Insufficient documentation

## 2011-12-17 DIAGNOSIS — K219 Gastro-esophageal reflux disease without esophagitis: Secondary | ICD-10-CM | POA: Insufficient documentation

## 2011-12-17 DIAGNOSIS — M5430 Sciatica, unspecified side: Secondary | ICD-10-CM

## 2011-12-17 DIAGNOSIS — Z79899 Other long term (current) drug therapy: Secondary | ICD-10-CM | POA: Insufficient documentation

## 2011-12-17 DIAGNOSIS — F17200 Nicotine dependence, unspecified, uncomplicated: Secondary | ICD-10-CM | POA: Insufficient documentation

## 2011-12-17 MED ORDER — CYCLOBENZAPRINE HCL 10 MG PO TABS: 10.0000 mg | ORAL_TABLET | Freq: Three times a day (TID) | ORAL | Status: AC | PRN

## 2011-12-17 MED ORDER — OXYCODONE-ACETAMINOPHEN 5-325 MG PO TABS
1.0000 | ORAL_TABLET | Freq: Once | ORAL | Status: AC
  Administered 2011-12-17: 1 via ORAL
  Filled 2011-12-17: qty 1

## 2011-12-17 MED ORDER — HYDROCODONE-ACETAMINOPHEN 5-325 MG PO TABS: ORAL_TABLET | ORAL | Status: AC

## 2011-12-17 NOTE — ED Notes (Addendum)
Pt c/o pain to right leg, states that he has gout that has bothered him, pt c/o fever, chills that started today, pt does state that he woke up with a cough this am with sputum production, color of phelgm is yellow and black

## 2011-12-17 NOTE — ED Provider Notes (Signed)
History     CSN: [id number]  Arrival date & time 12/17/11  1617   First MD Initiated Contact with Patient 12/17/11 1805      Chief Complaint  Patient presents with  . Fever  . Leg Pain    (Consider location/radiation/quality/duration/timing/severity/associated sxs/prior treatment) HPI Comments: Patient c/o right hip pain that radiates into his right thigh and lower leg for several days.  Describes the pain as burning and sharp. Worsens with standing.  He denies incontinence of urine or feces, weakness or numbness.  He also c/o cough, fever and nasal congestion that began today.  He denies chest pain or shortness of breath.    Patient is a 50 y.o. male presenting with leg pain. The history is provided by the patient.  Leg Pain  The incident occurred more than 2 days ago. The incident occurred at home. There was no injury mechanism. The pain is present in the right hip and right thigh. The quality of the pain is described as burning, throbbing and aching. The pain has been constant since onset. Pertinent negatives include no numbness, no inability to bear weight, no loss of motion, no muscle weakness, no loss of sensation and no tingling. He reports no foreign bodies present. The symptoms are aggravated by nothing. He has tried nothing for the symptoms. The treatment provided no relief.    Past Medical History  Diagnosis Date  . Bronchitis   . Acid reflux   . Pancreatitis   . Gout     Past Surgical History  Procedure Date  . Nasal suregery     Family History  Problem Relation Age of Onset  . Diabetes Father     History  Substance Use Topics  . Smoking status: Current Everyday Smoker -- 0.5 packs/day  . Smokeless tobacco: Not on file  . Alcohol Use: Yes     Daily      Review of Systems  Constitutional: Positive for fever and chills. Negative for appetite change.  HENT: Positive for congestion and rhinorrhea. Negative for sore throat, trouble swallowing, neck pain  and neck stiffness.   Respiratory: Positive for cough. Negative for chest tightness and wheezing.   Gastrointestinal: Negative for abdominal pain.  Genitourinary: Negative for dysuria and difficulty urinating.  Musculoskeletal: Positive for back pain.  Skin: Negative.   Neurological: Negative for tingling and numbness.  Hematological: Does not bruise/bleed easily.  All other systems reviewed and are negative.    Allergies  Penicillins  Home Medications   Current Outpatient Rx  Name Route Sig Dispense Refill  . BC HEADACHE POWDER PO Oral Take 1 packet by mouth as needed. For pain     . RA SINUS TABLETS EX ST PO Oral Take 1 tablet by mouth as needed. For symptoms     . OMEGA-3 FATTY ACIDS 1000 MG PO CAPS Oral Take 1 g by mouth daily.      Marland Kitchen GEMFIBROZIL 600 MG PO TABS Oral Take 600 mg by mouth 2 (two) times daily before a meal.      . OMEPRAZOLE 20 MG PO CPDR Oral Take 20 mg by mouth daily.      . OXYCODONE HCL 5 MG PO TABS Oral Take 1-2 tablets (5-10 mg total) by mouth every 4 (four) hours as needed for pain. 30 tablet 0  . POTASSIUM CHLORIDE CR 10 MEQ PO TBCR Oral Take 1 tablet (10 mEq total) by mouth 2 (two) times daily. 10 tablet 0  . TRAZODONE HCL 100 MG  PO TABS Oral Take 100 mg by mouth at bedtime.      . CYCLOBENZAPRINE HCL 10 MG PO TABS Oral Take 1 tablet (10 mg total) by mouth 3 (three) times daily as needed for muscle spasms. 21 tablet 0  . HYDROCODONE-ACETAMINOPHEN 5-325 MG PO TABS  Take one-two tabs po q 4-6 hrs prn pain 20 tablet 0    BP 135/79  Pulse 100  Temp(Src) 98.5 F (36.9 C) (Oral)  Resp 20  Ht 5' 10.5" (1.791 m)  Wt 193 lb (87.544 kg)  BMI 27.30 kg/m2  SpO2 100%  Physical Exam  Nursing note and vitals reviewed. Constitutional: He is oriented to person, place, and time. He appears well-developed and well-nourished. No distress.  HENT:  Head: Normocephalic and atraumatic.  Neck: Normal range of motion. Neck supple.  Cardiovascular: Normal rate, regular  rhythm and normal heart sounds.   No murmur heard. Pulmonary/Chest: Effort normal and breath sounds normal. No respiratory distress. He has no wheezes. He has no rales.  Abdominal: Soft. There is no tenderness.  Musculoskeletal: He exhibits no tenderness.       Right hip: He exhibits tenderness. He exhibits normal range of motion, normal strength, no swelling, no crepitus, no deformity and no laceration.  Lymphadenopathy:    He has no cervical adenopathy.  Neurological: He is alert and oriented to person, place, and time. No cranial nerve deficit or sensory deficit. He exhibits normal muscle tone. Coordination normal.  Reflex Scores:      Patellar reflexes are 2+ on the right side and 2+ on the left side.      Achilles reflexes are 2+ on the right side and 2+ on the left side. Skin: Skin is warm and dry.    ED Course  Procedures (including critical care time)    1. Sciatica       MDM    ttp of the right SI joint and pain radiates into lateral right hip, thigh and lateral calf.  Pt has full ROM of the hip joint.  No focal neuro deficits.  No bony tenderness.  Likely sciatica.  He agrees to close f/u with his PMD   Cough, fever and congestion are likely URI related.       Chakia Counts L. Winferd Wease, [place] 12/19/11 1245

## 2011-12-17 NOTE — ED Notes (Signed)
PA in with pt prior to RN, see PA assessment

## 2011-12-22 NOTE — ED Provider Notes (Signed)
Medical screening examination/treatment/procedure(s) were performed by non-physician practitioner and as supervising physician I was immediately available for consultation/collaboration. Dakia Schifano, MD, FACEP   Brinleigh Tew L Leialoha Hanna, MD 12/22/11 1002 

## 2012-01-23 ENCOUNTER — Emergency Department (HOSPITAL_COMMUNITY)
Admission: EM | Admit: 2012-01-23 | Discharge: 2012-01-23 | Disposition: A | Discharge status: Home / Self Care | Payer: Medicare Other | Attending: Emergency Medicine | Admitting: Emergency Medicine

## 2012-01-23 ENCOUNTER — Encounter (HOSPITAL_COMMUNITY): Payer: Self-pay | Admitting: Emergency Medicine

## 2012-01-23 DIAGNOSIS — K219 Gastro-esophageal reflux disease without esophagitis: Secondary | ICD-10-CM | POA: Insufficient documentation

## 2012-01-23 DIAGNOSIS — R51 Headache: Secondary | ICD-10-CM | POA: Insufficient documentation

## 2012-01-23 DIAGNOSIS — R110 Nausea: Secondary | ICD-10-CM | POA: Insufficient documentation

## 2012-01-23 DIAGNOSIS — J329 Chronic sinusitis, unspecified: Secondary | ICD-10-CM

## 2012-01-23 DIAGNOSIS — J3489 Other specified disorders of nose and nasal sinuses: Secondary | ICD-10-CM | POA: Insufficient documentation

## 2012-01-23 MED ORDER — KETOROLAC TROMETHAMINE 60 MG/2ML IM SOLN
60.0000 mg | Freq: Once | INTRAMUSCULAR | Status: AC
  Administered 2012-01-23: 60 mg via INTRAMUSCULAR
  Filled 2012-01-23: qty 2

## 2012-01-23 MED ORDER — DIPHENHYDRAMINE HCL 25 MG PO CAPS
50.0000 mg | ORAL_CAPSULE | Freq: Once | ORAL | Status: AC
  Administered 2012-01-23: 50 mg via ORAL
  Filled 2012-01-23: qty 2

## 2012-01-23 MED ORDER — AZITHROMYCIN 250 MG PO TABS
ORAL_TABLET | ORAL | Status: DC
Start: 2012-01-23 — End: 2012-02-21

## 2012-01-23 MED ORDER — METOCLOPRAMIDE HCL 5 MG/ML IJ SOLN
10.0000 mg | Freq: Once | INTRAMUSCULAR | Status: AC
  Administered 2012-01-23: 10 mg via INTRAMUSCULAR
  Filled 2012-01-23: qty 2

## 2012-01-23 MED ORDER — GUAIFENESIN-CODEINE 100-10 MG/5ML PO SYRP: 10.0000 mL | ORAL_SOLUTION | Freq: Three times a day (TID) | ORAL | Status: AC | PRN

## 2012-01-23 NOTE — ED Provider Notes (Signed)
History     CSN: [id number]  Arrival date & time 01/23/12  1029   First MD Initiated Contact with Patient 01/23/12 1042      Chief Complaint  Patient presents with  . Headache  . Nausea    (Consider location/radiation/quality/duration/timing/severity/associated sxs/prior treatment) Patient is a 51 y.o. male presenting with headaches. The history is provided by the patient. No language interpreter was used.  Headache  This is a new problem. The current episode started more than 2 days ago. The problem occurs constantly. The problem has not changed since onset.The headache is associated with coughing (sinus congestion and pressure). The pain is located in the frontal region. The quality of the pain is described as dull. The pain is moderate. The pain radiates to the face. Associated symptoms include nausea. Pertinent negatives include no fever, no malaise/fatigue, no chest pressure, no near-syncope, no syncope, no shortness of breath and no vomiting. Treatments tried: sinus medication. The treatment provided no relief.    Past Medical History  Diagnosis Date  . Bronchitis   . Acid reflux   . Pancreatitis   . Gout     Past Surgical History  Procedure Date  . Nasal suregery     Family History  Problem Relation Age of Onset  . Diabetes Father     History  Substance Use Topics  . Smoking status: Current Everyday Smoker -- 0.5 packs/day  . Smokeless tobacco: Not on file  . Alcohol Use: Yes     Daily      Review of Systems  Constitutional: Negative for fever, malaise/fatigue, activity change and appetite change.  HENT: Positive for congestion, rhinorrhea and sinus pressure. Negative for sore throat, facial swelling, neck pain and neck stiffness.   Respiratory: Negative for chest tightness and shortness of breath.   Cardiovascular: Negative for syncope and near-syncope.  Gastrointestinal: Positive for nausea. Negative for vomiting and abdominal pain.  Genitourinary:  Negative for dysuria.  Skin: Negative.   Neurological: Positive for headaches. Negative for dizziness, weakness and numbness.  Hematological: Negative for adenopathy.  Psychiatric/Behavioral: Negative for confusion and decreased concentration.  All other systems reviewed and are negative.    Allergies  Penicillins  Home Medications   Current Outpatient Rx  Name Route Sig Dispense Refill  . BC HEADACHE POWDER PO Oral Take 1 packet by mouth as needed. For pain     . RA SINUS TABLETS EX ST PO Oral Take 1 tablet by mouth as needed. For symptoms     . OMEGA-3 FATTY ACIDS 1000 MG PO CAPS Oral Take 1 g by mouth daily.      Marland Kitchen GEMFIBROZIL 600 MG PO TABS Oral Take 600 mg by mouth 2 (two) times daily before a meal.      . OMEPRAZOLE 20 MG PO CPDR Oral Take 20 mg by mouth daily.      Marland Kitchen POTASSIUM CHLORIDE ER 10 MEQ PO TBCR Oral Take 1 tablet (10 mEq total) by mouth 2 (two) times daily. 10 tablet 0  . TRAZODONE HCL 100 MG PO TABS Oral Take 100 mg by mouth at bedtime.        BP 130/95  Pulse 95  Temp(Src) 98.3 F (36.8 C) (Oral)  Resp 18  SpO2 99%  Physical Exam  Nursing note and vitals reviewed. Constitutional: He is oriented to person, place, and time. He appears well-developed and well-nourished. No distress.  HENT:  Head: Normocephalic and atraumatic.  Right Ear: Tympanic membrane and ear canal normal.  Left Ear: Tympanic membrane and ear canal normal.  Nose: Mucosal edema present. No rhinorrhea or sinus tenderness. Right sinus exhibits no maxillary sinus tenderness and no frontal sinus tenderness. Left sinus exhibits no maxillary sinus tenderness and no frontal sinus tenderness.  Mouth/Throat: Uvula is midline, oropharynx is clear and moist and mucous membranes are normal.  Eyes: EOM are normal. Pupils are equal, round, and reactive to light.  Neck: Normal range of motion. Neck supple. No spinous process tenderness and no muscular tenderness present. No rigidity. Normal range of  motion present. No Brudzinski's sign and no Kernig's sign noted.  Cardiovascular: Normal rate, regular rhythm and normal heart sounds.   Pulmonary/Chest: Effort normal and breath sounds normal. No respiratory distress. He exhibits no tenderness.  Musculoskeletal: Normal range of motion. He exhibits no tenderness.  Lymphadenopathy:    He has no cervical adenopathy.  Neurological: He is alert and oriented to person, place, and time. He has normal reflexes. No cranial nerve deficit. He exhibits normal muscle tone. Coordination normal.  Skin: Skin is warm and dry.    ED Course  Procedures (including critical care time)       MDM      Patient is alert and oriented. Ambulates in the room without difficulty. Gait is steady.  No focal neuro deficits. No meningeal signs. He is nontoxic appearing.  Patient is feeling better, headache has resolved.  Likely sinus headache.  Doubt SAH.  Pt agrees to return here if needed    Keller Mikels L. [place], [place] 01/23/12 2039

## 2012-01-23 NOTE — ED Notes (Signed)
Patient c/o headache, nausea since Sunday. Reports congestion as well and vomiting x 1 today. Patient Denies vision changes. Denies photosensitivity.

## 2012-01-23 NOTE — ED Provider Notes (Signed)
Medical screening examination/treatment/procedure(s) were conducted as a shared visit with non-physician practitioner(s) and myself.  I personally evaluated the patient during the encounter  The patient has had a headache for several days, without known etiology or similar problem in the past. On evaluation he denies neck pain and exhibits normal range of motion of the neck. He was evaluated after injection of Toradol, but completely resolved. His headache has resolved,  He was able to drink and eat  after treatment. Headache is nonspecific, he is stable for discharge.  Flint Melter, MD 01/23/12 [phone]

## 2012-01-25 NOTE — ED Provider Notes (Signed)
Medical screening examination/treatment/procedure(s) were performed by non-physician practitioner and as supervising physician I was immediately available for consultation/collaboration.  Flint Melter, MD 01/25/12 [phone]

## 2012-01-27 ENCOUNTER — Emergency Department (HOSPITAL_COMMUNITY)
Admission: EM | Admit: 2012-01-27 | Discharge: 2012-01-27 | Disposition: A | Discharge status: Home / Self Care | Payer: Medicare Other | Attending: Emergency Medicine | Admitting: Emergency Medicine

## 2012-01-27 ENCOUNTER — Encounter (HOSPITAL_COMMUNITY): Payer: Self-pay | Admitting: *Deleted

## 2012-01-27 DIAGNOSIS — F17200 Nicotine dependence, unspecified, uncomplicated: Secondary | ICD-10-CM | POA: Insufficient documentation

## 2012-01-27 DIAGNOSIS — K219 Gastro-esophageal reflux disease without esophagitis: Secondary | ICD-10-CM | POA: Insufficient documentation

## 2012-01-27 DIAGNOSIS — Z79899 Other long term (current) drug therapy: Secondary | ICD-10-CM | POA: Insufficient documentation

## 2012-01-27 DIAGNOSIS — M79609 Pain in unspecified limb: Secondary | ICD-10-CM | POA: Insufficient documentation

## 2012-01-27 DIAGNOSIS — Z8639 Personal history of other endocrine, nutritional and metabolic disease: Secondary | ICD-10-CM | POA: Insufficient documentation

## 2012-01-27 DIAGNOSIS — M2540 Effusion, unspecified joint: Secondary | ICD-10-CM | POA: Insufficient documentation

## 2012-01-27 DIAGNOSIS — R0982 Postnasal drip: Secondary | ICD-10-CM | POA: Insufficient documentation

## 2012-01-27 DIAGNOSIS — J329 Chronic sinusitis, unspecified: Secondary | ICD-10-CM

## 2012-01-27 DIAGNOSIS — M779 Enthesopathy, unspecified: Secondary | ICD-10-CM | POA: Insufficient documentation

## 2012-01-27 DIAGNOSIS — Z862 Personal history of diseases of the blood and blood-forming organs and certain disorders involving the immune mechanism: Secondary | ICD-10-CM | POA: Insufficient documentation

## 2012-01-27 MED ORDER — HYDROCODONE-ACETAMINOPHEN 5-325 MG PO TABS: 1.0000 | ORAL_TABLET | ORAL | Status: AC | PRN

## 2012-01-27 MED ORDER — HYDROCODONE-ACETAMINOPHEN 5-325 MG PO TABS
2.0000 | ORAL_TABLET | Freq: Once | ORAL | Status: AC
  Administered 2012-01-27: 2 via ORAL
  Filled 2012-01-27: qty 2

## 2012-01-27 MED ORDER — PSEUDOEPHEDRINE HCL 60 MG PO TABS: ORAL_TABLET | ORAL | Status: DC

## 2012-01-27 MED ORDER — PSEUDOEPHEDRINE HCL 60 MG PO TABS
60.0000 mg | ORAL_TABLET | Freq: Once | ORAL | Status: AC
  Administered 2012-01-27: 60 mg via ORAL
  Filled 2012-01-27: qty 1

## 2012-01-27 MED ORDER — DEXAMETHASONE SODIUM PHOSPHATE 4 MG/ML IJ SOLN
8.0000 mg | Freq: Once | INTRAMUSCULAR | Status: AC
  Administered 2012-01-27: 8 mg via INTRAMUSCULAR
  Filled 2012-01-27: qty 2

## 2012-01-27 MED ORDER — DEXAMETHASONE 6 MG PO TABS: ORAL_TABLET | ORAL | Status: AC

## 2012-01-27 NOTE — ED Notes (Signed)
Pain rt thumb for 2 weeks, unsure of injury,  Alert, Has brace on knee.  Says he also has sinus infection. Alert, NAD

## 2012-01-27 NOTE — ED Notes (Signed)
Pain rt  Thumb for 2 weeks, "sharp pain"  Intermittent. Now constant .  No injury known.

## 2012-01-27 NOTE — ED Notes (Signed)
Pt has left room to go heat the food he brought with him.  Visitor in room  At present

## 2012-02-10 ENCOUNTER — Encounter (HOSPITAL_COMMUNITY): Payer: Self-pay | Admitting: Emergency Medicine

## 2012-02-10 ENCOUNTER — Emergency Department (HOSPITAL_COMMUNITY)
Admission: EM | Admit: 2012-02-10 | Discharge: 2012-02-10 | Disposition: A | Discharge status: Home / Self Care | Payer: Medicare Other | Attending: Emergency Medicine | Admitting: Emergency Medicine

## 2012-02-10 DIAGNOSIS — J019 Acute sinusitis, unspecified: Secondary | ICD-10-CM | POA: Insufficient documentation

## 2012-02-10 DIAGNOSIS — R1110 Vomiting, unspecified: Secondary | ICD-10-CM | POA: Insufficient documentation

## 2012-02-10 DIAGNOSIS — K219 Gastro-esophageal reflux disease without esophagitis: Secondary | ICD-10-CM | POA: Insufficient documentation

## 2012-02-10 DIAGNOSIS — F17200 Nicotine dependence, unspecified, uncomplicated: Secondary | ICD-10-CM | POA: Insufficient documentation

## 2012-02-10 MED ORDER — BECLOMETHASONE DIPROP MONOHYD 42 MCG/SPRAY NA SUSP: 2.0000 | Freq: Two times a day (BID) | NASAL | Status: DC

## 2012-02-10 MED ORDER — DOXYCYCLINE HYCLATE 100 MG PO CAPS: 100.0000 mg | ORAL_CAPSULE | Freq: Two times a day (BID) | ORAL | Status: DC

## 2012-02-10 MED ORDER — DOXYCYCLINE HYCLATE 100 MG PO TABS
100.0000 mg | ORAL_TABLET | Freq: Once | ORAL | Status: AC
  Administered 2012-02-10: 100 mg via ORAL
  Filled 2012-02-10: qty 1

## 2012-02-10 NOTE — ED Notes (Signed)
Pt alert & oriented x4, stable gait. Pt given discharge instructions, paperwork & prescription(s). Patient instructed to stop at the registration desk to finish any additional paperwork. pt verbalized understanding. Pt left department w/ no further questions.  

## 2012-02-10 NOTE — ED Notes (Signed)
Pt c/o nosebleeds, nausea, periods of sweating, and runny nose since Saturday.

## 2012-02-10 NOTE — ED Provider Notes (Signed)
This chart was scribed for Carleene Cooper III, MD by Wallis Mart. The patient was seen in room [facility] and the patient's care was started at 8:23 PM.   CSN: [id number]  Arrival date & time 02/10/12  1509   First MD Initiated Contact with Patient 02/10/12 2020      Chief Complaint  Patient presents with  . Emesis  . Excessive Sweating  . Nasal Congestion  . Epistaxis    (Consider location/radiation/quality/duration/timing/severity/associated sxs/prior treatment) HPI  [name] [name] is a 51 y.o. male who presents to the Emergency Department complaining of sudden onset, gradually worsening, intermittent, moderate vomiting onset two days ago. This is a recurrent problem, pt was seen in ER 2 weeks ago for similar sx and prescribed azithromycin w/ no relief.  Pt c/o associated nausea, headache, cold sweats, nasal congestion, nosebleeds, runny eyes. There are no other associated symptoms and no other alleviating or aggravating factors.  Pt drinks alcohol and smokes cigarettes.   Past Medical History  Diagnosis Date  . Bronchitis   . Acid reflux   . Pancreatitis   . Gout     Past Surgical History  Procedure Date  . Nasal suregery     Family History  Problem Relation Age of Onset  . Diabetes Father     History  Substance Use Topics  . Smoking status: Current Everyday Smoker -- 0.5 packs/day  . Smokeless tobacco: Not on file  . Alcohol Use: Yes     Daily      Review of Systems  10 Systems reviewed and are negative for acute change except as noted in the HPI.  Allergies  Penicillins  Home Medications   Current Outpatient Rx  Name Route Sig Dispense Refill  . BC HEADACHE POWDER PO Oral Take 1 packet by mouth as needed. For pain     . AZITHROMYCIN 250 MG PO TABS  Two tablets po qd x 3 days 6 tablet 6  . RA SINUS TABLETS EX ST PO Oral Take 1 tablet by mouth as needed. Sinus Congestion    . OMEPRAZOLE 20 MG PO CPDR Oral Take 20 mg by mouth daily.      .  OXYCODONE-ACETAMINOPHEN 5-325 MG PO TABS Oral Take 1 tablet by mouth every 4 (four) hours as needed. Pain    . PSEUDOEPHEDRINE HCL 60 MG PO TABS  1 po tid for congestion 30 tablet 0  . TRAZODONE HCL 100 MG PO TABS Oral Take 100 mg by mouth 3 times/day as needed-between meals & bedtime. Sleep      BP 132/88  Pulse 102  Temp(Src) 98.3 F (36.8 C) (Oral)  Resp 20  Ht 5' 10.5" (1.791 m)  Wt 200 lb (90.719 kg)  BMI 28.29 kg/m2  SpO2 100%  Physical Exam  Nursing note and vitals reviewed. Constitutional: He is oriented to person, place, and time. He appears well-developed and well-nourished. No distress.  HENT:  Head: Normocephalic and atraumatic.  Right Ear: External ear normal.  Left Ear: External ear normal.       Swelling of nasal mucosa  Eyes: EOM are normal. Pupils are equal, round, and reactive to light.  Neck: Normal range of motion. Neck supple. No tracheal deviation present.  Cardiovascular: Normal rate and regular rhythm.   Pulmonary/Chest: Effort normal and breath sounds normal. No respiratory distress.  Abdominal: Soft. Bowel sounds are normal. He exhibits no distension.  Musculoskeletal: Normal range of motion. He exhibits no edema.  Lymphadenopathy:    He has no  cervical adenopathy.  Neurological: He is alert and oriented to person, place, and time. No sensory deficit.  Skin: Skin is warm and dry.  Psychiatric: He has a normal mood and affect. His behavior is normal.    ED Course  Procedures (including critical care time) DIAGNOSTIC STUDIES: Oxygen Saturation is 100% on room air, normal by my interpretation.    COORDINATION OF CARE:  8:30 PM: Pt evaluated and physical exam complete Results for orders placed during the hospital encounter of 12/10/11  CBC      Component Value Range   WBC 7.5  4.0 - 10.5 (K/uL)   RBC 4.34  4.22 - 5.81 (MIL/uL)   Hemoglobin 14.2  13.0 - 17.0 (g/dL)   HCT 40.9  81.1 - 91.4 (%)   MCV 96.1  78.0 - 100.0 (fL)   MCH 32.7  26.0 -  34.0 (pg)   MCHC 34.1  30.0 - 36.0 (g/dL)   RDW 78.2  95.6 - 21.3 (%)   Platelets 368  150 - 400 (K/uL)  DIFFERENTIAL      Component Value Range   Neutrophils Relative 74  43 - 77 (%)   Neutro Abs 5.5  1.7 - 7.7 (K/uL)   Lymphocytes Relative 15  12 - 46 (%)   Lymphs Abs 1.1  0.7 - 4.0 (K/uL)   Monocytes Relative 10  3 - 12 (%)   Monocytes Absolute 0.8  0.1 - 1.0 (K/uL)   Eosinophils Relative 1  0 - 5 (%)   Eosinophils Absolute 0.1  0.0 - 0.7 (K/uL)   Basophils Relative 0  0 - 1 (%)   Basophils Absolute 0.0  0.0 - 0.1 (K/uL)  COMPREHENSIVE METABOLIC PANEL      Component Value Range   Sodium 134 (*) 135 - 145 (mEq/L)   Potassium 3.6  3.5 - 5.1 (mEq/L)   Chloride 95 (*) 96 - 112 (mEq/L)   CO2 29  19 - 32 (mEq/L)   Glucose, Bld 111 (*) 70 - 99 (mg/dL)   BUN 4 (*) 6 - 23 (mg/dL)   Creatinine, Ser 0.86  0.50 - 1.35 (mg/dL)   Calcium 9.7  8.4 - 57.8 (mg/dL)   Total Protein 8.6 (*) 6.0 - 8.3 (g/dL)   Albumin 3.4 (*) 3.5 - 5.2 (g/dL)   AST 20  0 - 37 (U/L)   ALT 8  0 - 53 (U/L)   Alkaline Phosphatase 74  39 - 117 (U/L)   Total Bilirubin 0.4  0.3 - 1.2 (mg/dL)   GFR calc non Af Amer >90  >90 (mL/min)   GFR calc Af Amer >90  >90 (mL/min)  LIPASE, BLOOD      Component Value Range   Lipase 146 (*) 11 - 59 (U/L)  URINALYSIS, ROUTINE W REFLEX MICROSCOPIC      Component Value Range   Color, Urine YELLOW  YELLOW    APPearance CLEAR  CLEAR    Specific Gravity, Urine 1.005  1.005 - 1.030    pH 7.0  5.0 - 8.0    Glucose, UA NEGATIVE  NEGATIVE (mg/dL)   Hgb urine dipstick NEGATIVE  NEGATIVE    Bilirubin Urine NEGATIVE  NEGATIVE    Ketones, ur NEGATIVE  NEGATIVE (mg/dL)   Protein, ur NEGATIVE  NEGATIVE (mg/dL)   Urobilinogen, UA 0.2  0.0 - 1.0 (mg/dL)   Nitrite NEGATIVE  NEGATIVE    Leukocytes, UA NEGATIVE  NEGATIVE   ETHANOL      Component Value Range   Alcohol,  Ethyl (B) <11  0 - 11 (mg/dL)    1. Acute sinusitis       I personally performed the services described in this  documentation, which was scribed in my presence. The recorded information has been reviewed and considered.  Osvaldo Human, M.D.    Carleene Cooper III, MD 02/11/12 [phone]

## 2012-02-10 NOTE — ED Notes (Signed)
DR. Ignacia Palma in to see pt prior to RN, see edp assessment for further

## 2012-02-13 ENCOUNTER — Encounter (HOSPITAL_COMMUNITY): Payer: Self-pay | Admitting: *Deleted

## 2012-02-13 ENCOUNTER — Emergency Department (HOSPITAL_COMMUNITY)
Admission: EM | Admit: 2012-02-13 | Discharge: 2012-02-13 | Disposition: A | Discharge status: Home / Self Care | Payer: Medicare Other | Attending: Emergency Medicine | Admitting: Emergency Medicine

## 2012-02-13 DIAGNOSIS — K219 Gastro-esophageal reflux disease without esophagitis: Secondary | ICD-10-CM | POA: Insufficient documentation

## 2012-02-13 DIAGNOSIS — Z79899 Other long term (current) drug therapy: Secondary | ICD-10-CM | POA: Insufficient documentation

## 2012-02-13 DIAGNOSIS — F17200 Nicotine dependence, unspecified, uncomplicated: Secondary | ICD-10-CM | POA: Insufficient documentation

## 2012-02-13 DIAGNOSIS — M549 Dorsalgia, unspecified: Secondary | ICD-10-CM | POA: Insufficient documentation

## 2012-02-13 DIAGNOSIS — R109 Unspecified abdominal pain: Secondary | ICD-10-CM | POA: Insufficient documentation

## 2012-02-13 MED ORDER — PROMETHAZINE HCL 25 MG PO TABS: 25.0000 mg | ORAL_TABLET | Freq: Four times a day (QID) | ORAL | Status: DC | PRN

## 2012-02-13 MED ORDER — OXYCODONE-ACETAMINOPHEN 5-325 MG PO TABS: 1.0000 | ORAL_TABLET | ORAL | Status: AC | PRN

## 2012-02-13 MED ORDER — MORPHINE SULFATE 10 MG/ML IJ SOLN
10.0000 mg | Freq: Once | INTRAMUSCULAR | Status: AC
  Administered 2012-02-13: 10 mg via INTRAMUSCULAR
  Filled 2012-02-13: qty 1

## 2012-02-13 MED ORDER — ONDANSETRON HCL 4 MG/2ML IJ SOLN
4.0000 mg | Freq: Once | INTRAMUSCULAR | Status: AC
  Administered 2012-02-13: 4 mg via INTRAMUSCULAR
  Filled 2012-02-13: qty 2

## 2012-02-13 NOTE — ED Notes (Addendum)
Pain from lt buttock down to foot, nausea and pain rt hand from carpal tunnel, vomiting, no diarrhea.[name] [name]

## 2012-02-13 NOTE — ED Provider Notes (Signed)
History     CSN: [id number]  Arrival date & time 01/27/12  1051   None     Chief Complaint  Patient presents with  . Hand Pain    (Consider location/radiation/quality/duration/timing/severity/associated sxs/prior treatment) Patient is a 51 y.o. male presenting with hand pain. The history is provided by the patient.  Hand Pain This is a new problem. The current episode started 1 to 4 weeks ago. The problem occurs daily. The problem has been gradually worsening. Associated symptoms include congestion and joint swelling. Pertinent negatives include no abdominal pain, arthralgias, chest pain, coughing or neck pain. Exacerbated by: Movement and palpation of the right thumb. He has tried nothing for the symptoms. The treatment provided no relief.  Hand Pain This is a new problem. The current episode started 1 to 4 weeks ago. The problem occurs daily. The problem has been gradually worsening. Pertinent negatives include no chest pain, no abdominal pain and no shortness of breath. Exacerbated by: Movement and palpation of the right thumb. He has tried nothing for the symptoms. The treatment provided no relief.    Past Medical History  Diagnosis Date  . Bronchitis   . Acid reflux   . Pancreatitis   . Gout     Past Surgical History  Procedure Date  . Nasal suregery     Family History  Problem Relation Age of Onset  . Diabetes Father     History  Substance Use Topics  . Smoking status: Current Everyday Smoker -- 0.5 packs/day  . Smokeless tobacco: Not on file  . Alcohol Use: Yes     Daily      Review of Systems  Constitutional: Negative for activity change.       All ROS Neg except as noted in HPI  HENT: Positive for congestion and postnasal drip. Negative for nosebleeds and neck pain.   Eyes: Negative for photophobia and discharge.  Respiratory: Negative for cough, shortness of breath and wheezing.   Cardiovascular: Negative for chest pain and palpitations.    Gastrointestinal: Negative for abdominal pain and blood in stool.  Genitourinary: Negative for dysuria, frequency and hematuria.  Musculoskeletal: Positive for joint swelling. Negative for back pain and arthralgias.  Skin: Negative.   Neurological: Negative for dizziness, seizures and speech difficulty.  Psychiatric/Behavioral: Negative for hallucinations and confusion.    Allergies  Penicillins  Home Medications   Current Outpatient Rx  Name Route Sig Dispense Refill  . BC HEADACHE POWDER PO Oral Take 1 packet by mouth as needed. For pain     . AZITHROMYCIN 250 MG PO TABS  Two tablets po qd x 3 days 6 tablet 6  . BECLOMETHASONE DIPROP MONOHYD 42 MCG/SPRAY NA SUSP Nasal Place 2 sprays into the nose 2 (two) times daily. Dose is for each nostril. 25 g 0  . RA SINUS TABLETS EX ST PO Oral Take 1 tablet by mouth as needed. Sinus Congestion    . DOXYCYCLINE HYCLATE 100 MG PO CAPS Oral Take 1 capsule (100 mg total) by mouth 2 (two) times daily. 20 capsule 0  . OMEPRAZOLE 20 MG PO CPDR Oral Take 20 mg by mouth daily.      . OXYCODONE-ACETAMINOPHEN 5-325 MG PO TABS Oral Take 1 tablet by mouth every 4 (four) hours as needed. Pain    . PSEUDOEPHEDRINE HCL 60 MG PO TABS  1 po tid for congestion 30 tablet 0  . TRAZODONE HCL 100 MG PO TABS Oral Take 100 mg by mouth 3 times/day  as needed-between meals & bedtime. Sleep      BP 122/78  Pulse 113  Temp(Src) 98.3 F (36.8 C) (Oral)  Resp 20  Ht 5\' 10"  (1.778 m)  Wt 198 lb (89.812 kg)  BMI 28.41 kg/m2  SpO2 100%  Physical Exam  Nursing note and vitals reviewed. Constitutional: He is oriented to person, place, and time. He appears well-developed and well-nourished.  Non-toxic appearance.  HENT:  Head: Normocephalic.  Right Ear: Tympanic membrane and external ear normal.  Left Ear: Tympanic membrane and external ear normal.       Nasal congestion.  Eyes: EOM and lids are normal. Pupils are equal, round, and reactive to light.  Neck: Normal  range of motion. Neck supple. Carotid bruit is not present.  Cardiovascular: Normal rate, regular rhythm, normal heart sounds, intact distal pulses and normal pulses.   Pulmonary/Chest: Breath sounds normal. No respiratory distress.       Course breath sounds.  Abdominal: Soft. Bowel sounds are normal. There is no tenderness. There is no guarding.  Musculoskeletal: Normal range of motion.       Pain with flexion and extension of the right dome extending from the MP joint to the wrist area. Pain to palpation along the extensor tendon area. Good capillary refill. Sensory within normal limits.   Lymphadenopathy:       Head (right side): No submandibular adenopathy present.       Head (left side): No submandibular adenopathy present.    He has no cervical adenopathy.  Neurological: He is alert and oriented to person, place, and time. He has normal strength. No cranial nerve deficit or sensory deficit.  Skin: Skin is warm and dry.  Psychiatric: He has a normal mood and affect. His speech is normal.    ED Course  Procedures (including critical care time)  Labs Reviewed - No data to display No results found.   1. Tendonitis   2. Sinusitis       MDM  I have reviewed nursing notes, vital signs, and all appropriate lab and imaging results for this patient.  Examination is consistent with tendinitis and sinusitis. The patient has a thumb spica splint applied. He's been advised to see an orthopedist if this is not improving. Prescription forDecadron, hydrocodone, and Sudafed given to the patient.  Medical screening examination/treatment/procedure(s) were performed by non-physician practitioner and as supervising physician I was immediately available for consultation/collaboration. Osvaldo Human, M.D.    Kathie Dike, [place] 02/13/12 1224  Carleene Cooper III, MD 02/13/12 1311

## 2012-02-13 NOTE — ED Provider Notes (Signed)
History     CSN: [id number]  Arrival date & time 02/13/12  1744   First MD Initiated Contact with Patient 02/13/12 1821      Chief Complaint  Patient presents with  . Back Pain    (Consider location/radiation/quality/duration/timing/severity/associated sxs/prior treatment) Patient is a 51 y.o. male presenting with abdominal pain. The history is provided by the patient and medical records. No language interpreter was used.  Abdominal Pain The primary symptoms of the illness include abdominal pain, nausea and vomiting. The primary symptoms of the illness do not include fever or diarrhea. Primary symptoms comment: Pt had onset of abdominal and back pain this morning.  He has been vomiting up whatever he tried to eat or drink.  He has a history of pancreatitis.  He denies drinking recently.  The current episode started 6 to 12 hours ago. The onset of the illness was sudden. The problem has not changed since onset. Nausea began today. The nausea is exacerbated by food.   The patient has not had a change in bowel habit. Risk factors: He has a history of pancreatitis.  Additional symptoms associated with the illness include back pain. Symptoms associated with the illness do not include chills. Associated medical issues comments: He has run out of medicine for pain and nausea..    Past Medical History  Diagnosis Date  . Bronchitis   . Acid reflux   . Pancreatitis   . Gout     Past Surgical History  Procedure Date  . Nasal suregery     Family History  Problem Relation Age of Onset  . Diabetes Father     History  Substance Use Topics  . Smoking status: Current Everyday Smoker -- 0.5 packs/day  . Smokeless tobacco: Not on file  . Alcohol Use: Yes     Daily      Review of Systems  Constitutional: Negative.  Negative for fever and chills.  HENT: Positive for postnasal drip and sinus pressure.   Eyes: Negative.   Respiratory: Negative.   Cardiovascular: Negative.     Gastrointestinal: Positive for nausea, vomiting and abdominal pain. Negative for diarrhea.  Genitourinary: Negative.   Musculoskeletal: Positive for back pain.  Neurological: Negative.   Psychiatric/Behavioral: Negative.     Allergies  Penicillins  Home Medications   Current Outpatient Rx  Name Route Sig Dispense Refill  . BC HEADACHE POWDER PO Oral Take 1 packet by mouth as needed. For pain     . AZITHROMYCIN 250 MG PO TABS  Two tablets po qd x 3 days 6 tablet 6  . BECLOMETHASONE DIPROP MONOHYD 42 MCG/SPRAY NA SUSP Nasal Place 2 sprays into the nose 2 (two) times daily. Dose is for each nostril. 25 g 0  . RA SINUS TABLETS EX ST PO Oral Take 1 tablet by mouth as needed. Sinus Congestion    . DOXYCYCLINE HYCLATE 100 MG PO CAPS Oral Take 1 capsule (100 mg total) by mouth 2 (two) times daily. 20 capsule 0  . OMEPRAZOLE 20 MG PO CPDR Oral Take 20 mg by mouth daily.      . OXYCODONE-ACETAMINOPHEN 5-325 MG PO TABS Oral Take 1 tablet by mouth every 4 (four) hours as needed. Pain    . PSEUDOEPHEDRINE HCL 60 MG PO TABS  1 po tid for congestion 30 tablet 0  . TRAZODONE HCL 100 MG PO TABS Oral Take 100 mg by mouth 3 times/day as needed-between meals & bedtime. Sleep      BP 133/93  Pulse 85  Temp(Src) 98.5 F (36.9 C) (Oral)  Resp 18  Ht 5\' 10"  (1.778 m)  Wt 200 lb (90.719 kg)  BMI 28.70 kg/m2  SpO2 100%  Physical Exam  Nursing note and vitals reviewed. Constitutional: He is oriented to person, place, and time. He appears well-developed and well-nourished.       In no apparent distress at rest.  HENT:  Head: Normocephalic and atraumatic.  Right Ear: External ear normal.  Left Ear: External ear normal.  Mouth/Throat: Oropharynx is clear and moist.  Eyes: Conjunctivae and EOM are normal. Pupils are equal, round, and reactive to light. No scleral icterus.  Neck: Normal range of motion.  Cardiovascular: Normal rate, regular rhythm and normal heart sounds.   Pulmonary/Chest: Effort  normal and breath sounds normal.  Abdominal: Soft. Bowel sounds are normal. He exhibits no distension. There is no tenderness.  Musculoskeletal: Normal range of motion.  Lymphadenopathy:    He has no cervical adenopathy.  Neurological: He is alert and oriented to person, place, and time.       No sensory or motor deficit.  Skin: Skin is warm and dry.  Psychiatric: He has a normal mood and affect. His behavior is normal.    ED Course  Procedures (including critical care time)  6:50 PM Pt seen --> physical exam performed. Old charts reviewed.  Where his exam is benign, will medicate for pain and nausea and recheck in 30-45 minutes.  8:15 PM Pt eating crackers and peanut butter, drinking soda.  Still complains of pain in the back and into the left leg.  He has chronic pain, so will prescribe him Percocet and Zofran.  He needs to get a primary care physician.  I suggested the name of Syliva Overman, M.D.; he can call her office to see if she will accept him as a patient.  1. Abdominal  pain, other specified site   2. Back pain          Carleene Cooper III, MD 02/13/12 2021

## 2012-02-13 NOTE — Discharge Instructions (Signed)

## 2012-02-13 NOTE — ED Notes (Signed)
Pt provided with sprite and crackers for PO trial

## 2012-02-13 NOTE — ED Notes (Signed)
Pelvic exam by edp. Cultures obtained

## 2012-02-18 ENCOUNTER — Encounter (HOSPITAL_COMMUNITY): Payer: Self-pay | Admitting: *Deleted

## 2012-02-18 ENCOUNTER — Emergency Department (HOSPITAL_COMMUNITY)
Admission: EM | Admit: 2012-02-18 | Discharge: 2012-02-18 | Disposition: A | Discharge status: Home / Self Care | Payer: Medicare Other | Attending: Emergency Medicine | Admitting: Emergency Medicine

## 2012-02-18 DIAGNOSIS — F17200 Nicotine dependence, unspecified, uncomplicated: Secondary | ICD-10-CM | POA: Insufficient documentation

## 2012-02-18 DIAGNOSIS — R51 Headache: Secondary | ICD-10-CM | POA: Insufficient documentation

## 2012-02-18 DIAGNOSIS — Z79899 Other long term (current) drug therapy: Secondary | ICD-10-CM | POA: Insufficient documentation

## 2012-02-18 MED ORDER — HYDROMORPHONE HCL PF 1 MG/ML IJ SOLN
1.0000 mg | Freq: Once | INTRAMUSCULAR | Status: AC
  Administered 2012-02-18: 1 mg via INTRAVENOUS
  Filled 2012-02-18: qty 1

## 2012-02-18 MED ORDER — METOCLOPRAMIDE HCL 5 MG/ML IJ SOLN
10.0000 mg | Freq: Once | INTRAMUSCULAR | Status: AC
Start: 2012-02-18 — End: 2012-02-18
  Administered 2012-02-18: 10 mg via INTRAVENOUS
  Filled 2012-02-18: qty 2

## 2012-02-18 MED ORDER — DIPHENHYDRAMINE HCL 50 MG/ML IJ SOLN
25.0000 mg | Freq: Once | INTRAMUSCULAR | Status: AC
  Administered 2012-02-18: 25 mg via INTRAVENOUS
  Filled 2012-02-18: qty 1

## 2012-02-18 MED ORDER — KETOROLAC TROMETHAMINE 30 MG/ML IJ SOLN
15.0000 mg | Freq: Once | INTRAMUSCULAR | Status: AC
  Administered 2012-02-18: 15 mg via INTRAVENOUS
  Filled 2012-02-18: qty 1

## 2012-02-18 NOTE — ED Notes (Signed)
Migraine ha that started today with n, sound sensitivity, and intermittent visual disturbances.

## 2012-02-18 NOTE — Discharge Instructions (Signed)
Headache, General, Unknown Cause The specific cause of your headache may not have been found today. There are many causes and types of headache. A few common ones are:  Tension headache.   Migraine.   Infections (examples: dental and sinus infections).   Bone and/or joint problems in the neck or jaw.   Depression.   Eye problems.  These headaches are not life threatening.  Headaches can sometimes be diagnosed by a patient history and a physical exam. Sometimes, lab and imaging studies (such as x-ray and/or CT scan) are used to rule out more serious problems. In some cases, a spinal tap (lumbar puncture) may be requested. There are many times when your exam and tests may be normal on the first visit even when there is a serious problem causing your headaches. Because of that, it is very important to follow up with your doctor or local clinic for further evaluation. FINDING OUT THE RESULTS OF TESTS  If a radiology test was performed, a radiologist will review your results.   You will be contacted by the emergency department or your physician if any test results require a change in your treatment plan.   Not all test results may be available during your visit. If your test results are not back during the visit, make an appointment with your caregiver to find out the results. Do not assume everything is normal if you have not heard from your caregiver or the medical facility. It is important for you to follow up on all of your test results.  HOME CARE INSTRUCTIONS   Keep follow-up appointments with your caregiver, or any specialist referral.   Only take over-the-counter or prescription medicines for pain, discomfort, or fever as directed by your caregiver.   Biofeedback, massage, or other relaxation techniques may be helpful.   Ice packs or heat applied to the head and neck can be used. Do this three to four times per day, or as needed.   Call your doctor if you have any questions or  concerns.   If you smoke, you should quit.  SEEK MEDICAL CARE IF:   You develop problems with medications prescribed.   You do not respond to or obtain relief from medications.   You have a change from the usual headache.   You develop nausea or vomiting.  SEEK IMMEDIATE MEDICAL CARE IF:   If your headache becomes severe.   You have an unexplained oral temperature above 102 F (38.9 C), or as your caregiver suggests.   You have a stiff neck.   You have loss of vision.   You have muscular weakness.   You have loss of muscular control.   You develop severe symptoms different from your first symptoms.   You start losing your balance or have trouble walking.   You feel faint or pass out.  MAKE SURE YOU:   Understand these instructions.   Will watch your condition.   Will get help right away if you are not doing well or get worse.  Document Released: 12/09/2005 Document Revised: 08/21/2011 Document Reviewed: 07/28/2008 [facility] Patient Information 2012 [place], [place].Headaches, Frequently Asked Questions MIGRAINE HEADACHES Q: What is migraine? What causes it? How can I treat it? A: Generally, migraine headaches begin as a dull ache. Then they develop into a constant, throbbing, and pulsating pain. You may experience pain at the temples. You may experience pain at the front or back of one or both sides of the head. The pain is usually accompanied  can I treat it?  A: Generally, migraine headaches begin as a dull ache. Then they develop into a constant, throbbing, and pulsating pain. You may experience pain at the temples. You may experience pain at the front or back of one or both sides of the head. The pain is usually accompanied by a combination of:   Nausea.    Vomiting.    Sensitivity to light and noise.   Some people (about 15%) experience an aura (see below) before an attack. The cause of migraine is believed to be chemical reactions in the brain. Treatment for migraine may include over-the-counter or prescription medications. It may also include self-help techniques. These include relaxation training and biofeedback.    Q: What is an aura?   A: About 15% of people with migraine get an "aura". This is a sign of neurological symptoms that occur before a migraine headache. You may see wavy or jagged lines, dots, or flashing lights. You might experience tunnel vision or blind spots in one or both eyes. The aura can include visual or auditory hallucinations (something imagined). It may include disruptions in smell (such as strange odors), taste or touch. Other symptoms include:   Numbness.    A "pins and needles" sensation.    Difficulty in recalling or speaking the correct word.   These neurological events may last as long as 60 minutes. These symptoms will fade as the headache begins.  Q: What is a trigger?  A: Certain physical or environmental factors can lead to or "trigger" a migraine. These include:   Foods.    Hormonal changes.    Weather.    Stress.   It is important to remember that triggers are different for everyone. To help prevent migraine attacks, you need to figure out which triggers affect you. Keep a headache diary. This is a good way to track triggers. The diary will help you talk to your healthcare professional about your condition.  Q: Does weather affect migraines?  A: Bright sunshine, hot, humid conditions, and drastic changes in barometric pressure may lead to, or "trigger," a migraine attack in some people. But studies have shown that weather does not act as a trigger for everyone with migraines.  Q: What is the link between migraine and hormones?  A: Hormones start and regulate many of your body's functions. Hormones keep your body in balance within a constantly changing environment. The levels of hormones in your body are unbalanced at times. Examples are during menstruation, pregnancy, or menopause. That can lead to a migraine attack. In fact, about three quarters of all women with migraine report that their attacks are related to the menstrual cycle.    Q: Is there an increased risk of stroke for migraine sufferers?   A: The likelihood of a migraine attack causing a stroke is very remote. That is not to say that migraine sufferers cannot have a stroke associated with their migraines. In persons under age 40, the most common associated factor for stroke is migraine headache. But over the course of a person's normal life span, the occurrence of migraine headache may actually be associated with a reduced risk of dying from cerebrovascular disease due to stroke.    Q: What are acute medications for migraine?  A: Acute medications are used to treat the pain of the headache after it has started. Examples over-the-counter medications, NSAIDs, ergots, and triptans.    Q: What are the triptans?  A: Triptans are the newest class of abortive   medications. They are specifically targeted to treat migraine. Triptans are vasoconstrictors. They moderate some chemical reactions in the brain. The triptans work on receptors in your brain. Triptans help to restore the balance of a neurotransmitter called serotonin. Fluctuations in levels of serotonin are thought to be a main cause of migraine.    Q: Are over-the-counter medications for migraine effective?  A: Over-the-counter, or "OTC," medications may be effective in relieving mild to moderate pain and associated symptoms of migraine. But you should see your caregiver before beginning any treatment regimen for migraine.    Q: What are preventive medications for migraine?  A: Preventive medications for migraine are sometimes referred to as "prophylactic" treatments. They are used to reduce the frequency, severity, and length of migraine attacks. Examples of preventive medications include antiepileptic medications, antidepressants, beta-blockers, calcium channel blockers, and NSAIDs (nonsteroidal anti-inflammatory drugs).  Q: Why are anticonvulsants used to treat migraine?   A: During the past few years, there has been an increased interest in antiepileptic drugs for the prevention of migraine. They are sometimes referred to as "anticonvulsants". Both epilepsy and migraine may be caused by similar reactions in the brain.    Q: Why are antidepressants used to treat migraine?  A: Antidepressants are typically used to treat people with depression. They may reduce migraine frequency by regulating chemical levels, such as serotonin, in the brain.    Q: What alternative therapies are used to treat migraine?  A: The term "alternative therapies" is often used to describe treatments considered outside the scope of conventional Western medicine. Examples of alternative therapy include acupuncture, acupressure, and yoga. Another common alternative treatment is herbal therapy. Some herbs are believed to relieve headache pain. Always discuss alternative therapies with your caregiver before proceeding. Some herbal products contain arsenic and other toxins.  TENSION HEADACHES  Q: What is a tension-type headache? What causes it? How can I treat it?  A: Tension-type headaches occur randomly. They are often the result of temporary stress, anxiety, fatigue, or anger. Symptoms include soreness in your temples, a tightening band-like sensation around your head (a "vice-like" ache). Symptoms can also include a pulling feeling, pressure sensations, and contracting head and neck muscles. The headache begins in your forehead, temples, or the back of your head and neck. Treatment for tension-type headache may include over-the-counter or prescription medications. Treatment may also include self-help techniques such as relaxation training and biofeedback.  CLUSTER HEADACHES  Q: What is a cluster headache? What causes it? How can I treat it?   A: Cluster headache gets its name because the attacks come in groups. The pain arrives with little, if any, warning. It is usually on one side of the head. A tearing or bloodshot eye and a runny nose on the same side of the headache may also accompany the pain. Cluster headaches are believed to be caused by chemical reactions in the brain. They have been described as the most severe and intense of any headache type. Treatment for cluster headache includes prescription medication and oxygen.  SINUS HEADACHES  Q: What is a sinus headache? What causes it? How can I treat it?  A: When a cavity in the bones of the face and skull (a sinus) becomes inflamed, the inflammation will cause localized pain. This condition is usually the result of an allergic reaction, a tumor, or an infection. If your headache is caused by a sinus blockage, such as an infection, you will probably have a fever. An x-ray will confirm a sinus   that is being overused. Sometimes your caregiver will gradually substitute a different type of treatment or medication. Stopping may be a challenge. Regularly overusing a medication increases the potential for serious side effects. Consult a caregiver if you regularly use headache medications more than 2 days per week or more than the label advises. ADDITIONAL QUESTIONS AND ANSWERS Q: What is biofeedback? A: Biofeedback is a self-help treatment. Biofeedback uses special equipment to monitor your body's involuntary physical responses. Biofeedback monitors:  Breathing.   Pulse.   Heart rate.   Temperature.   Muscle tension.   Brain activity.  Biofeedback helps you refine and perfect your relaxation exercises. You learn to control the physical  responses that are related to stress. Once the technique has been mastered, you do not need the equipment any more. Q: Are headaches hereditary? A: Four out of five (80%) of people that suffer report a family history of migraine. Scientists are not sure if this is genetic or a family predisposition. Despite the uncertainty, a child has a 50% chance of having migraine if one parent suffers. The child has a 75% chance if both parents suffer.  Q: Can children get headaches? A: By the time they reach high school, most young people have experienced some type of headache. Many safe and effective approaches or medications can prevent a headache from occurring or stop it after it has begun.  Q: What type of doctor should I see to diagnose and treat my headache? A: Start with your primary caregiver. Discuss his or her experience and approach to headaches. Discuss methods of classification, diagnosis, and treatment. Your caregiver may decide to recommend you to a headache specialist, depending upon your symptoms or other physical conditions. Having diabetes, allergies, etc., may require a more comprehensive and inclusive approach to your headache. The National Headache Foundation will provide, upon request, a list of [facility] physician members in your state. Document Released: 02/29/2004 Document Revised: 08/29/2  RESOURCE GUIDE  Dental Problems  Patients with Medicaid: [facility] [phone] [address]                                           [phone] [address] Phone:  [phone]                                                  Phone:  [phone]  If unable to pay or uninsured, contact:  [facility] or [facility]. to become qualified for the adult dental clinic.  Chronic Pain Problems Contact [facility]  [phone] Patients need to be referred by their primary care doctor.  Insufficient Money for Medicine Contact [employer]:  call "211" or [facility] 508-005-5058.  No Primary Care Doctor Call [facility]  8121510022 Other agencies that provide inexpensive medical care    [facility]  [phone]    [facility]  [phone]    [facility]  4094974809    [facility]  [phone]    [facility]  [phone]    [facility]  [phone]  Psychological Services [facility]  [phone] [facility]  [phone] [facility]   [phone] (emergency services [phone])  Substance Abuse Resources [facility]  7746591641 [facility] [phone] [facility] [phone] Floydene Flock 8206965067 Residential & Outpatient Substance Abuse Program  [phone]  Abuse/Neglect [facility] Child Abuse Hotline [phone] [facility] Child Abuse Hotline 201-585-3113 (After Hours)  Emergency [facility] [phone]  Maternity Homes [facility] at [facility] [place] [facility] [place] [phone] [facility] [phone]  MRSA Hotline #:   432-864-0307    [facility] Resources  [facility] [place] [facility]     [employer]                          [facility] Dept. [address] [place]                       [address]      [address]  Blondell Reveal Phone:  [phone]                                   Phone:  [phone]                 Phone:  [phone]  [facility] Phone:  [phone]  [facility] Child Abuse Hotline 4848794338 [phone] (After Hours)   [facility] Patient Information 2012 [place], [place].

## 2012-02-18 NOTE — ED Notes (Signed)
Migraine started yesterday; Pt has never had a headache like this before. Pain across front of head c/o general weakness Neuro WNL

## 2012-02-18 NOTE — ED Provider Notes (Signed)
History    51 year old male with headache. Gradual onset yesterday. Patient cannot remember what he is doing at onset. Pain is in the frontal and bitemporal region. Achy. Relatively constant. No appreciable exacerbating relieving factors. No acute visual changes. No fever or chills. Denies trauma. No neck pain or neck stiffness. No numbness, tingling or loss of strength. No confusion per family at bedside. Denies use of blood thinning medication. No family history of cerebral aneurysm the patient is aware of. No contacts with similar symptoms.  CSN: [id number]  Arrival date & time 02/18/12  [name] [name]   First MD Initiated Contact with Patient 02/18/12 1838      Chief Complaint  Patient presents with  . Migraine    (Consider location/radiation/quality/duration/timing/severity/associated sxs/prior treatment) HPI  Past Medical History  Diagnosis Date  . Bronchitis   . Acid reflux   . Pancreatitis   . Gout     Past Surgical History  Procedure Date  . Nasal suregery     Family History  Problem Relation Age of Onset  . Diabetes Father     History  Substance Use Topics  . Smoking status: Current Everyday Smoker -- 0.5 packs/day  . Smokeless tobacco: Not on file  . Alcohol Use: Yes     Daily      Review of Systems   Review of symptoms negative unless otherwise noted in HPI.   Allergies  Penicillins  Home Medications   Current Outpatient Rx  Name Route Sig Dispense Refill  . BC HEADACHE POWDER PO Oral Take 1 packet by mouth as needed. For pain     . AZITHROMYCIN 250 MG PO TABS  Two tablets po qd x 3 days 6 tablet 6  . RA SINUS TABLETS EX ST PO Oral Take 1 tablet by mouth as needed. Sinus Congestion    . HYDROCODONE-ACETAMINOPHEN 5-325 MG PO TABS Oral Take 1 tablet by mouth every 8 (eight) hours as needed. For pain    . OXYCODONE-ACETAMINOPHEN 5-325 MG PO TABS Oral Take 1 tablet by mouth every 4 (four) hours as needed for pain. 20 tablet 0  . PROMETHAZINE HCL 25 MG PO  TABS Oral Take 1 tablet (25 mg total) by mouth every 6 (six) hours as needed for nausea. 20 tablet 0  . RANITIDINE HCL 150 MG PO TABS Oral Take 150 mg by mouth 2 (two) times daily.    . TRAZODONE HCL 100 MG PO TABS Oral Take 200 mg by mouth at bedtime. Sleep    . BECLOMETHASONE DIPROP MONOHYD 42 MCG/SPRAY NA SUSP Nasal Place 2 sprays into the nose 2 (two) times daily. Dose is for each nostril. 25 g 0  . PSEUDOEPHEDRINE HCL 60 MG PO TABS  1 po tid for congestion 30 tablet 0    BP 134/99  Pulse 89  Temp(Src) 98.2 F (36.8 C) (Oral)  Resp 20  Ht 5' 10.5" (1.791 m)  Wt 185 lb (83.915 kg)  BMI 26.17 kg/m2  SpO2 100%  Physical Exam  Nursing note and vitals reviewed. Constitutional: He is oriented to person, place, and time. He appears well-developed and well-nourished. No distress.  HENT:  Head: Normocephalic and atraumatic.  Right Ear: External ear normal.  Left Ear: External ear normal.  Mouth/Throat: Oropharynx is clear and moist. No oropharyngeal exudate.  Eyes: Conjunctivae and EOM are normal. Pupils are equal, round, and reactive to light. Right eye exhibits no discharge. Left eye exhibits no discharge.       Muddy sclera but does  not appear icteric  Neck: Normal range of motion. Neck supple.       Supple and without adenopathy  Cardiovascular: Normal rate, regular rhythm and normal heart sounds.  Exam reveals no gallop and no friction rub.   No murmur heard. Pulmonary/Chest: Effort normal and breath sounds normal. No respiratory distress.  Abdominal: Soft. He exhibits no distension. There is no tenderness.  Musculoskeletal: He exhibits no edema and no tenderness.  Lymphadenopathy:    He has no cervical adenopathy.  Neurological: He is alert and oriented to person, place, and time. No cranial nerve deficit. He exhibits normal muscle tone. Coordination normal.       Good finger to nose testing bilaterally.  Skin: Skin is warm and dry. He is not diaphoretic.  Psychiatric: He has  a normal mood and affect. His behavior is normal. Thought content normal.    ED Course  Procedures (including critical care time)  Labs Reviewed - No data to display No results found.   1. Headache       MDM  51 year old male with headache. Suspect primary headache. Consider secondary causes such as bleed, infectious or mass but doubt. Patient is afebrile and has no signs of meningismus. No contacts with similar to suggest carbon monoxide poisoning. No history of trauma. Patient is not on blood thinning medication. Consider carotid or vertebral artery dissection but doubt. Consider ocular etiology such as acute angle closure glaucoma but doubt. Patient has no acute ocular complaints. Not likely temporal arteritis given the patient's age and lack of other symptoms. Patient has no acute neurological complaints aside from headache and has a nonfocal neurological examination. Patient reports feeling better after medications. New complaints prior to discharge. Ambulate with a steady gait. Return precautions were discussed. Outpatient followup otherwise.        Raeford Razor, MD 02/18/12 2131

## 2012-02-18 NOTE — ED Notes (Signed)
Pt stable and ambulatory discharge

## 2012-02-21 ENCOUNTER — Encounter: Payer: Self-pay | Admitting: Family Medicine

## 2012-02-21 ENCOUNTER — Ambulatory Visit (INDEPENDENT_AMBULATORY_CARE_PROVIDER_SITE_OTHER): Payer: Medicare Other | Admitting: Family Medicine

## 2012-02-21 VITALS — BP 136/80 | HR 86 | Resp 18 | Ht 69.5 in | Wt 183.1 lb

## 2012-02-21 DIAGNOSIS — J309 Allergic rhinitis, unspecified: Secondary | ICD-10-CM

## 2012-02-21 DIAGNOSIS — M25569 Pain in unspecified knee: Secondary | ICD-10-CM

## 2012-02-21 DIAGNOSIS — J329 Chronic sinusitis, unspecified: Secondary | ICD-10-CM

## 2012-02-21 DIAGNOSIS — M5430 Sciatica, unspecified side: Secondary | ICD-10-CM

## 2012-02-21 DIAGNOSIS — F319 Bipolar disorder, unspecified: Secondary | ICD-10-CM

## 2012-02-21 DIAGNOSIS — K219 Gastro-esophageal reflux disease without esophagitis: Secondary | ICD-10-CM

## 2012-02-21 MED ORDER — OMEPRAZOLE 20 MG PO CPDR: 20.0000 mg | DELAYED_RELEASE_CAPSULE | Freq: Every day | ORAL | Status: DC

## 2012-02-21 MED ORDER — FLUTICASONE PROPIONATE 50 MCG/ACT NA SUSP: 1.0000 | Freq: Two times a day (BID) | NASAL | Status: DC

## 2012-02-21 MED ORDER — METHYLPREDNISOLONE ACETATE 40 MG/ML IJ SUSP
40.0000 mg | Freq: Once | INTRAMUSCULAR | Status: AC
  Administered 2012-02-21: 40 mg via INTRAMUSCULAR

## 2012-02-21 MED ORDER — CEFTRIAXONE SODIUM 1 G IJ SOLR
1.0000 g | Freq: Once | INTRAMUSCULAR | Status: AC
  Administered 2012-02-21: 1 g via INTRAMUSCULAR

## 2012-02-21 NOTE — Patient Instructions (Signed)
For your sinuses- you have been given a shot of Rocephin (antibiotic) and steroids Try the new nose spray Flonase  For your stomach, stop the ranitidine and start Omeprazole Continue the medications from [facility] For your pain -take the Vicodin during the day and Percocet at night For refills on your pain medication you need to call Dr. Ophelia Charter  I will get records F/U 4 weeks

## 2012-02-23 ENCOUNTER — Encounter: Payer: Self-pay | Admitting: Family Medicine

## 2012-02-23 ENCOUNTER — Encounter (HOSPITAL_COMMUNITY): Payer: Self-pay

## 2012-02-23 ENCOUNTER — Emergency Department (HOSPITAL_COMMUNITY)
Admission: EM | Admit: 2012-02-23 | Discharge: 2012-02-23 | Discharge status: Against Medical Advice | Payer: Medicare Other | Attending: Emergency Medicine | Admitting: Emergency Medicine

## 2012-02-23 DIAGNOSIS — F319 Bipolar disorder, unspecified: Secondary | ICD-10-CM | POA: Insufficient documentation

## 2012-02-23 DIAGNOSIS — J329 Chronic sinusitis, unspecified: Secondary | ICD-10-CM | POA: Insufficient documentation

## 2012-02-23 DIAGNOSIS — R109 Unspecified abdominal pain: Secondary | ICD-10-CM | POA: Insufficient documentation

## 2012-02-23 DIAGNOSIS — J309 Allergic rhinitis, unspecified: Secondary | ICD-10-CM | POA: Insufficient documentation

## 2012-02-23 DIAGNOSIS — K219 Gastro-esophageal reflux disease without esophagitis: Secondary | ICD-10-CM | POA: Insufficient documentation

## 2012-02-23 NOTE — ED Notes (Addendum)
Pt c/o no treatment room to triage nurse. When explained to pt he would get the next available room in the acute side pt states "I noticed you took that little white boy back to a room". Pt clinched left fist at triage nurse. Security notified. Behavior witnessed by registration.

## 2012-02-23 NOTE — Assessment & Plan Note (Signed)
Flonase

## 2012-02-23 NOTE — ED Notes (Signed)
Spoke with Energy manager in regards to pt complaint and triage was notified pt would not be returning tonight. Pt chart will now be d/c as a LWBS.

## 2012-02-23 NOTE — Progress Notes (Signed)
  Subjective:    Patient ID: [name] [name], male    DOB: 11-Jul-1961, 51 y.o.   MRN: [id number]  HPI Pt here to establish care, previous PCP [facility] Medications and history reviewed  Pancreatitis- history of pancreatitis, with elevated TG, this has been followed, no current medications  Sinusitis seen in the ED on two occasions secondary to recurrent sinus diease, he was able to afford doxycycline but not the azithromycin or the nasal spray prescribed, he continues to have a lot of drainage from nose and down throat.   Gout- has difficulty with gout in his right foot  Hip pain- left hip pain x 1 year, described as shooting sharp pain from back down to foot  Heartburn- taking zantac BID this is not helping he has even taken 600mg  BID per report  Knee pain- acute knee injury, on workers comp, followed by Dr. Ophelia Charter orthopedic surgery, given vicodin by Dr., Ophelia Charter and percocet by the ED he takes both, planning for surgery in the new few weeks  Hand pain- chornic hand pain for 4 years, tokd arthritis and tendinitis  Bipolar/schizophrenia- followed by [facility] currently on Trazadone and another medication pt can not recall  Asthma/bronchitis-no current meds, smokes tobacco  Review of Systems   GEN- denies fatigue, fever, weight loss,weakness,+ recent illness HEENT- + eye drainage, change in vision,+ nasal discharge, CVS- denies chest pain, palpitations RESP- denies SOB, cough, wheeze ABD- denies N/V, change in stools, abd pain GU- denies dysuria, hematuria, dribbling, incontinence MSK- + joint pain, muscle aches, injury Neuro- denies headache, dizziness, syncope, seizure activity      Objective:   Physical Exam GEN- NAD, alert and oriented x3 HEENT- PERRL, EOMI, non injected sclera, pink conjunctiva, MMM, oropharynx clear Neck- Supple, no thryomegaly CVS- RRR, no murmur RESP-CTAB ABD-NABS,soft, NT, ND EXT- No edema, right knee with brace Pulses- Radial, DP- 2+ Psych- not depressed or  overly anxious appearing Back- neg SLR, able to flex and extend Hand- stiff with flexion at MIP, no swelling, no abnormal shape of fingers, normal wrist ROM       Assessment & Plan:

## 2012-02-23 NOTE — Assessment & Plan Note (Signed)
Change to PPI 

## 2012-02-23 NOTE — ED Notes (Signed)
Charge nurse Luz Lex notified of pts behavior and pt leaving.

## 2012-02-23 NOTE — Assessment & Plan Note (Signed)
Consider neurontin s/p surgery for knee

## 2012-02-23 NOTE — ED Notes (Signed)
Per registration pt left out front door of lobby with belongings and family member.

## 2012-02-23 NOTE — Assessment & Plan Note (Signed)
Defer to ortho for pain managment

## 2012-02-23 NOTE — Assessment & Plan Note (Signed)
Rocephin given, steroid injection

## 2012-02-23 NOTE — ED Notes (Addendum)
Pt upset about questions asked during triage regarding suicide risk and  screening. No bed available for pt. As pt was being wheeled back out to lobby pt was raising voice and stating "Someone needs to come up in here and take over. Theres never any rooms."

## 2012-02-23 NOTE — ED Notes (Signed)
Pt presents with abdominal pain and n/v since Thursday.

## 2012-02-23 NOTE — Assessment & Plan Note (Signed)
Defer to psych

## 2012-02-24 ENCOUNTER — Ambulatory Visit (INDEPENDENT_AMBULATORY_CARE_PROVIDER_SITE_OTHER): Payer: Medicare Other | Admitting: Family Medicine

## 2012-02-24 ENCOUNTER — Inpatient Hospital Stay (HOSPITAL_COMMUNITY)
Admission: AD | Admit: 2012-02-24 | Discharge: 2012-02-29 | DRG: 439 | Disposition: A | Discharge status: Home / Self Care | Payer: Medicare Other | Source: Ambulatory Visit | Attending: Internal Medicine | Admitting: Internal Medicine

## 2012-02-24 ENCOUNTER — Encounter: Payer: Self-pay | Admitting: Family Medicine

## 2012-02-24 ENCOUNTER — Encounter (HOSPITAL_COMMUNITY): Payer: Self-pay | Admitting: *Deleted

## 2012-02-24 ENCOUNTER — Ambulatory Visit: Payer: Medicare Other | Admitting: Family Medicine

## 2012-02-24 ENCOUNTER — Emergency Department (HOSPITAL_COMMUNITY)
Admission: EM | Admit: 2012-02-24 | Discharge: 2012-02-24 | Disposition: A | Discharge status: Home / Self Care | Payer: Medicare Other | Attending: Emergency Medicine | Admitting: Emergency Medicine

## 2012-02-24 ENCOUNTER — Other Ambulatory Visit: Payer: Self-pay

## 2012-02-24 ENCOUNTER — Encounter (HOSPITAL_COMMUNITY): Payer: Self-pay

## 2012-02-24 ENCOUNTER — Telehealth: Payer: Self-pay | Admitting: Family Medicine

## 2012-02-24 DIAGNOSIS — R112 Nausea with vomiting, unspecified: Secondary | ICD-10-CM | POA: Insufficient documentation

## 2012-02-24 DIAGNOSIS — F17200 Nicotine dependence, unspecified, uncomplicated: Secondary | ICD-10-CM | POA: Insufficient documentation

## 2012-02-24 DIAGNOSIS — E785 Hyperlipidemia, unspecified: Secondary | ICD-10-CM | POA: Insufficient documentation

## 2012-02-24 DIAGNOSIS — K7010 Alcoholic hepatitis without ascites: Secondary | ICD-10-CM | POA: Diagnosis present

## 2012-02-24 DIAGNOSIS — M5430 Sciatica, unspecified side: Secondary | ICD-10-CM

## 2012-02-24 DIAGNOSIS — K7200 Acute and subacute hepatic failure without coma: Secondary | ICD-10-CM

## 2012-02-24 DIAGNOSIS — K863 Pseudocyst of pancreas: Secondary | ICD-10-CM

## 2012-02-24 DIAGNOSIS — K219 Gastro-esophageal reflux disease without esophagitis: Secondary | ICD-10-CM | POA: Diagnosis present

## 2012-02-24 DIAGNOSIS — E871 Hypo-osmolality and hyponatremia: Secondary | ICD-10-CM | POA: Diagnosis present

## 2012-02-24 DIAGNOSIS — E7800 Pure hypercholesterolemia, unspecified: Secondary | ICD-10-CM | POA: Diagnosis present

## 2012-02-24 DIAGNOSIS — K859 Acute pancreatitis without necrosis or infection, unspecified: Secondary | ICD-10-CM

## 2012-02-24 DIAGNOSIS — R109 Unspecified abdominal pain: Secondary | ICD-10-CM | POA: Insufficient documentation

## 2012-02-24 DIAGNOSIS — F319 Bipolar disorder, unspecified: Secondary | ICD-10-CM | POA: Insufficient documentation

## 2012-02-24 DIAGNOSIS — K861 Other chronic pancreatitis: Secondary | ICD-10-CM | POA: Diagnosis present

## 2012-02-24 DIAGNOSIS — R945 Abnormal results of liver function studies: Secondary | ICD-10-CM

## 2012-02-24 DIAGNOSIS — E781 Pure hyperglyceridemia: Secondary | ICD-10-CM

## 2012-02-24 DIAGNOSIS — E876 Hypokalemia: Secondary | ICD-10-CM

## 2012-02-24 DIAGNOSIS — J329 Chronic sinusitis, unspecified: Secondary | ICD-10-CM

## 2012-02-24 DIAGNOSIS — M25569 Pain in unspecified knee: Secondary | ICD-10-CM

## 2012-02-24 DIAGNOSIS — K862 Cyst of pancreas: Secondary | ICD-10-CM | POA: Diagnosis present

## 2012-02-24 DIAGNOSIS — Z7982 Long term (current) use of aspirin: Secondary | ICD-10-CM

## 2012-02-24 DIAGNOSIS — J309 Allergic rhinitis, unspecified: Secondary | ICD-10-CM | POA: Diagnosis present

## 2012-02-24 DIAGNOSIS — F1010 Alcohol abuse, uncomplicated: Secondary | ICD-10-CM | POA: Diagnosis present

## 2012-02-24 HISTORY — DX: Pseudocyst of pancreas: K86.3

## 2012-02-24 LAB — GLUCOSE, CAPILLARY
Glucose-Capillary: 95 mg/dL (ref 70–99)
Glucose-Capillary: 84 mg/dL (ref 70–99)

## 2012-02-24 LAB — LIPASE, BLOOD: Lipase: 181 U/L — ABNORMAL HIGH (ref 11–59)

## 2012-02-24 LAB — CBC
HCT: 35.2 % — ABNORMAL LOW (ref 39.0–52.0)
Hemoglobin: 12.4 g/dL — ABNORMAL LOW (ref 13.0–17.0)
MCH: 32.9 pg (ref 26.0–34.0)
MCHC: 35.2 g/dL (ref 30.0–36.0)
MCV: 93.4 fL (ref 78.0–100.0)
Platelets: 312 10*3/uL (ref 150–400)
RBC: 3.77 MIL/uL — ABNORMAL LOW (ref 4.22–5.81)
RDW: 16.4 % — ABNORMAL HIGH (ref 11.5–15.5)
WBC: 6.4 10*3/uL (ref 4.0–10.5)

## 2012-02-24 LAB — URINALYSIS, ROUTINE W REFLEX MICROSCOPIC
Glucose, UA: NEGATIVE mg/dL
Hgb urine dipstick: NEGATIVE
Leukocytes, UA: NEGATIVE
Nitrite: NEGATIVE
Protein, ur: NEGATIVE mg/dL
Specific Gravity, Urine: 1.01 (ref 1.005–1.030)
Urobilinogen, UA: 0.2 mg/dL (ref 0.0–1.0)
pH: 6 (ref 5.0–8.0)

## 2012-02-24 LAB — COMPREHENSIVE METABOLIC PANEL
ALT: 75 U/L — ABNORMAL HIGH (ref 0–53)
AST: 270 U/L — ABNORMAL HIGH (ref 0–37)
Albumin: 2.8 g/dL — ABNORMAL LOW (ref 3.5–5.2)
Alkaline Phosphatase: 121 U/L — ABNORMAL HIGH (ref 39–117)
BUN: 15 mg/dL (ref 6–23)
CO2: 27 mEq/L (ref 19–32)
Calcium: 8.7 mg/dL (ref 8.4–10.5)
Chloride: 94 mEq/L — ABNORMAL LOW (ref 96–112)
Creatinine, Ser: 1.07 mg/dL (ref 0.50–1.35)
GFR calc Af Amer: >90 mL/min (ref >90)
GFR calc non Af Amer: 80 mL/min — ABNORMAL LOW (ref >90)
Glucose, Bld: 89 mg/dL (ref 70–99)
Potassium: 3.9 mEq/L (ref 3.5–5.1)
Sodium: 132 mEq/L — ABNORMAL LOW (ref 135–145)
Total Bilirubin: 1.9 mg/dL — ABNORMAL HIGH (ref 0.3–1.2)
Total Protein: 6 g/dL (ref 6.0–8.3)

## 2012-02-24 MED ORDER — ENOXAPARIN SODIUM 40 MG/0.4ML ~~LOC~~ SOLN
40.0000 mg | SUBCUTANEOUS | Status: DC
  Administered 2012-02-24 – 2012-02-28 (×5): 40 mg via SUBCUTANEOUS
  Filled 2012-02-24 (×5): qty 0.4

## 2012-02-24 MED ORDER — PROMETHAZINE HCL 25 MG/ML IJ SOLN: 12.5000 mg | Freq: Four times a day (QID) | INTRAMUSCULAR | Status: DC | PRN

## 2012-02-24 MED ORDER — SODIUM CHLORIDE 0.9 % IV BOLUS (SEPSIS)
1000.0000 mL | Freq: Once | INTRAVENOUS | Status: AC
  Administered 2012-02-24: 1000 mL via INTRAVENOUS

## 2012-02-24 MED ORDER — MAGNESIUM HYDROXIDE 400 MG/5ML PO SUSP
30.0000 mL | Freq: Every day | ORAL | Status: DC | PRN
  Administered 2012-02-26 – 2012-02-28 (×2): 30 mL via ORAL
  Filled 2012-02-24 (×3): qty 30

## 2012-02-24 MED ORDER — SODIUM CHLORIDE 0.9 % IV SOLN
INTRAVENOUS | Status: DC
  Administered 2012-02-24 – 2012-02-26 (×5): via INTRAVENOUS

## 2012-02-24 MED ORDER — HYDROMORPHONE HCL PF 1 MG/ML IJ SOLN
1.0000 mg | Freq: Once | INTRAMUSCULAR | Status: AC
  Administered 2012-02-24: 1 mg via INTRAVENOUS
  Filled 2012-02-24: qty 1

## 2012-02-24 MED ORDER — FLUTICASONE PROPIONATE 50 MCG/ACT NA SUSP
NASAL | Status: AC
  Filled 2012-02-24: qty 16

## 2012-02-24 MED ORDER — ONDANSETRON HCL 4 MG/2ML IJ SOLN
4.0000 mg | Freq: Once | INTRAMUSCULAR | Status: AC
  Administered 2012-02-24: 4 mg via INTRAVENOUS
  Filled 2012-02-24: qty 2

## 2012-02-24 MED ORDER — FLUTICASONE PROPIONATE 50 MCG/ACT NA SUSP
2.0000 | Freq: Two times a day (BID) | NASAL | Status: DC
  Administered 2012-02-24 – 2012-02-29 (×10): 2 via NASAL
  Filled 2012-02-24: qty 16

## 2012-02-24 MED ORDER — HYDROMORPHONE HCL PF 1 MG/ML IJ SOLN
1.0000 mg | INTRAMUSCULAR | Status: DC | PRN
  Administered 2012-02-24 – 2012-02-26 (×12): 1 mg via INTRAVENOUS
  Filled 2012-02-24 (×12): qty 1

## 2012-02-24 MED ORDER — SODIUM CHLORIDE 0.9 % IJ SOLN
INTRAMUSCULAR | Status: AC
  Administered 2012-02-24: 10 mL
  Filled 2012-02-24: qty 3

## 2012-02-24 MED ORDER — ALUM & MAG HYDROXIDE-SIMETH 200-200-20 MG/5ML PO SUSP
30.0000 mL | Freq: Four times a day (QID) | ORAL | Status: DC | PRN
  Administered 2012-02-27 (×2): 30 mL via ORAL
  Filled 2012-02-24: qty 30

## 2012-02-24 NOTE — ED Provider Notes (Signed)
History    51 year old male with abdominal pain. Gradual onset about 2 days ago. Diffuse but worse in the upper abdomen. Patient has a history of pancreatitis and states it feels similar to previous symptoms. No fevers or chills. No appreciable exacerbating relieving factors. Nausea and vomiting. Nonbilious, nonbloody emesis. No diarrhea. No urinary complaints. No fevers or chills. No sick contacts. Denies chest pain or shortness of breath.  CSN: [id number]  Arrival date & time 02/24/12  [id number]   First MD Initiated Contact with Patient 02/24/12 [phone]      Chief Complaint  Patient presents with  . Abdominal Pain    (Consider location/radiation/quality/duration/timing/severity/associated sxs/prior treatment) HPI  Past Medical History  Diagnosis Date  . Bronchitis   . Acid reflux   . Pancreatitis   . Gout   . Bipolar disorder   . Hyperlipidemia     Past Surgical History  Procedure Date  . Nasal suregery     Family History  Problem Relation Age of Onset  . Diabetes Father     History  Substance Use Topics  . Smoking status: Current Everyday Smoker -- 0.5 packs/day  . Smokeless tobacco: Not on file  . Alcohol Use: Yes     Daily      Review of Systems   Review of symptoms negative unless otherwise noted in HPI.    Allergies  Penicillins  Home Medications   Current Outpatient Rx  Name Route Sig Dispense Refill  . BC HEADACHE POWDER PO Oral Take 1 packet by mouth as needed. For pain     . FEXOFENADINE HCL 180 MG PO TABS Oral Take 180 mg by mouth daily.    Marland Kitchen FLUTICASONE PROPIONATE 50 MCG/ACT NA SUSP Nasal Place 1 spray into the nose 2 (two) times daily. 16 g 2  . HYDROCODONE-ACETAMINOPHEN 5-325 MG PO TABS Oral Take 1 tablet by mouth every 8 (eight) hours as needed. For pain    . OMEPRAZOLE 20 MG PO CPDR Oral Take 1 capsule (20 mg total) by mouth daily. 30 capsule 1  . OXYCODONE-ACETAMINOPHEN 5-325 MG PO TABS Oral Take 1 tablet by mouth every 4 (four) hours as  needed for pain. 20 tablet 0  . TRAZODONE HCL 100 MG PO TABS Oral Take 200 mg by mouth at bedtime. Sleep      BP 134/88  Pulse 102  Temp(Src) 98.8 F (37.1 C) (Oral)  Resp 20  Ht 5\' 9"  (1.753 m)  Wt 184 lb (83.462 kg)  BMI 27.17 kg/m2  SpO2 99%  Physical Exam  Nursing note and vitals reviewed. Constitutional: He appears well-developed and well-nourished.       Laying in bed. Mildly uncomfortable appearing but not toxic.  HENT:  Head: Normocephalic and atraumatic.  Eyes: Conjunctivae are normal. Pupils are equal, round, and reactive to light. Right eye exhibits no discharge. Left eye exhibits no discharge.  Neck: Neck supple.  Cardiovascular: Regular rhythm and normal heart sounds.  Exam reveals no gallop and no friction rub.   No murmur heard.      Mildly tachycardic with a regular rhythm.  Pulmonary/Chest: Effort normal and breath sounds normal. No respiratory distress.  Abdominal: Soft. He exhibits no distension. There is tenderness. There is guarding.       Moderate tenderness in epigastrium and left upper quadrant. Voluntary guarding. No rebound tenderness. No distention. No surgical scars noted.  Genitourinary:       No costovertebral angle tenderness  Musculoskeletal: He exhibits no edema and no tenderness.  Neurological: He is alert.  Skin: Skin is warm and dry. He is not diaphoretic.  Psychiatric: His behavior is normal. Thought content normal.    ED Course  Procedures (including critical care time)  Labs Reviewed  COMPREHENSIVE METABOLIC PANEL - Abnormal; Notable for the following:    Sodium 132 (*)    Chloride 94 (*)    Albumin 2.8 (*)    AST 270 (*)    ALT 75 (*)    Alkaline Phosphatase 121 (*)    Total Bilirubin 1.9 (*)    GFR calc non Af Amer 80 (*)    All other components within normal limits  LIPASE, BLOOD - Abnormal; Notable for the following:    Lipase 181 (*)    All other components within normal limits  CBC - Abnormal; Notable for the following:     RBC 3.77 (*)    Hemoglobin 12.4 (*)    HCT 35.2 (*)    RDW 16.4 (*)    All other components within normal limits  URINALYSIS, ROUTINE W REFLEX MICROSCOPIC - Abnormal; Notable for the following:    Bilirubin Urine SMALL (*)    Ketones, ur TRACE (*)    All other components within normal limits  LAB REPORT - SCANNED   No results found.  5:03 AM Laboratory called. The patient's serum looks like "whole milk.". CMP and lipase will need to be sent to [facility] for analysis.    1. Abdominal pain   2. Nausea and vomiting       MDM  51 year old male with abdominal pain. Patient has a history of chronic abdominal pain and pancreatitis. Suspect etiology of current complaints his pancreatitis. Unfortunately there was difficulty with the patient's lab samples and they will not be resulted in a timely manner. Feel patient is safe for discharge at this point in time though. Low clinical suspicion for emergent surgical process. Patient reports a complete resolution of symptoms. Is observed up and walk emergency department prior to discharge in no acute distress. Return precautions were discussed. Outpatient followup as needed.        Raeford Razor, MD 03/02/12 [id number]

## 2012-02-24 NOTE — Assessment & Plan Note (Addendum)
Will admit for work-up, previous history of liver failure that I am aware of, he does have history of alcoholism

## 2012-02-24 NOTE — ED Notes (Signed)
Gave patient drink as requested and MD approved. 

## 2012-02-24 NOTE — Patient Instructions (Signed)
Please go directly to admitting for admission for your pain

## 2012-02-24 NOTE — Assessment & Plan Note (Addendum)
Will send for admission for IVF, bowel rest, pain control, anti-emetics. Discussed with [facility] - Dr. Darnelle Spangle who accepted patient

## 2012-02-24 NOTE — Discharge Instructions (Signed)
Nausea and Vomiting Nausea means you feel sick to your stomach. Throwing up (vomiting) is a reflex where stomach contents come out of your mouth. HOME CARE   Take medicine as told by your doctor.   Do not force yourself to eat. However, you do need to drink fluids.   If you feel like eating, eat a normal diet as told by your doctor.   Eat rice, wheat, potatoes, bread, lean meats, yogurt, fruits, and vegetables.   Avoid high-fat foods.   Drink enough fluids to keep your pee (urine) clear or pale yellow.   Ask your doctor how to replace body fluid losses (rehydrate). Signs of body fluid loss (dehydration) include:   Feeling very thirsty.   Dry lips and mouth.   Feeling dizzy.   Dark pee.   Peeing less than normal.   Feeling confused.   Fast breathing or heart rate.  GET HELP RIGHT AWAY IF:   You have blood in your throw up.   You have black or bloody poop (stool).   You have a bad headache or stiff neck.   You feel confused.   You have bad belly (abdominal) pain.   You have chest pain or trouble breathing.   You do not pee at least once every 8 hours.   You have cold, clammy skin.   You keep throwing up after 24 to 48 hours.   You have a fever.  MAKE SURE YOU:   Understand these instructions.   Will watch your condition.   Will get help right away if you are not doing well or get worse.  Document Released: 05/27/2008 Document Revised: 11/28/2011 Document Reviewed: 05/10/2011 [facility] Patient Information 2012 [place], [place].Abdominal Pain Abdominal pain can be caused by many things. Your caregiver decides the seriousness of your pain by an examination and possibly blood tests and X-rays. Many cases can be observed and treated at home. Most abdominal pain is not caused by a disease and will probably improve without treatment. However, in many cases, more time must pass before a clear cause of the pain can be found. Before that point, it may not be known if  you need more testing, or if hospitalization or surgery is needed. HOME CARE INSTRUCTIONS   Do not take laxatives unless directed by your caregiver.   Take pain medicine only as directed by your caregiver.   Only take over-the-counter or prescription medicines for pain, discomfort, or fever as directed by your caregiver.   Try a clear liquid diet (broth, tea, or water) for as long as directed by your caregiver. Slowly move to a bland diet as tolerated.  SEEK IMMEDIATE MEDICAL CARE IF:   The pain does not go away.   You have a fever.   You keep throwing up (vomiting).   The pain is felt only in portions of the abdomen. Pain in the right side could possibly be appendicitis. In an adult, pain in the left lower portion of the abdomen could be colitis or diverticulitis.   You pass bloody or black tarry stools.  MAKE SURE YOU:   Understand these instructions.   Will watch your condition.   Will get help right away if you are not doing well or get worse.  Document Released: 09/18/2005 Document Revised: 11/28/2011 Document Reviewed: 07/27/2008 [facility] Patient Information 2012 [place], [place].

## 2012-02-24 NOTE — ED Notes (Signed)
Pt reports generalized abd pain and n/v x 2 days

## 2012-02-24 NOTE — Progress Notes (Signed)
  Subjective:    Patient ID: [name] [name], male    DOB: 1961/05/18, 51 y.o.   MRN: [id number]  HPI Patient presents with abdominal pain. He has a history of pancreatitis and states that this feels similar to his pancreatitis. He was seen in the emergency room this morning however his pain resolved therefore his discharge. He did have blood work taken however the results were not available prior to his discharge. I reviewed the blood work in a states he has elevated lipase to 181 as well as acute liver failure which is new for him.. he admits to nausea and vomiting associated with his pain. He denies diarrhea, fever but admits to chills. He denies shortness of breath, chest pain. In the emergency room this morning he was given Dilantin and Zofran which improved his symptoms.   Review of Systems   GEN- denies fatigue, fever, weight loss,weakness,+ recent illness HEENT- eye drainage, change in vision, nasal discharge, CVS- denies chest pain, palpitations RESP- denies SOB, cough, wheeze ABD- + N/V, change in stools,+ abd pain GU- denies dysuria, hematuria, dribbling, incontinence MSK- + joint pain, muscle aches, injury Neuro- denies headache, dizziness, syncope, seizure activity     Objective:   Physical Exam  GEN- NAD, alert and oriented x3, uncomfortable appearing HEENT- PERRL, EOMI, non icteric non injected sclera, pink conjunctiva, MMM, oropharynx clear CVS- RRR, no murmur RESP-CTAB ABD-NABS,soft,TTP diffusely, +gaurding,no rebound, no distension, no masses felt  EXT- No edema, right knee with brace Pulses- Radial, DP- 2+        Assessment & Plan:

## 2012-02-24 NOTE — Telephone Encounter (Signed)
Pt has appt this afternoon, will see him then

## 2012-02-25 DIAGNOSIS — F1010 Alcohol abuse, uncomplicated: Secondary | ICD-10-CM | POA: Diagnosis present

## 2012-02-25 LAB — COMPREHENSIVE METABOLIC PANEL
ALT: 56 U/L — ABNORMAL HIGH (ref 0–53)
AST: 121 U/L — ABNORMAL HIGH (ref 0–37)
Albumin: 2.8 g/dL — ABNORMAL LOW (ref 3.5–5.2)
Alkaline Phosphatase: 128 U/L — ABNORMAL HIGH (ref 39–117)
BUN: 3 mg/dL — ABNORMAL LOW (ref 6–23)
CO2: 27 mEq/L (ref 19–32)
Calcium: 8.2 mg/dL — ABNORMAL LOW (ref 8.4–10.5)
Chloride: 92 mEq/L — ABNORMAL LOW (ref 96–112)
Creatinine, Ser: 0.86 mg/dL (ref 0.50–1.35)
GFR calc Af Amer: >90 mL/min (ref >90)
GFR calc non Af Amer: >90 mL/min (ref >90)
Glucose, Bld: 101 mg/dL — ABNORMAL HIGH (ref 70–99)
Potassium: 3.4 mEq/L — ABNORMAL LOW (ref 3.5–5.1)
Sodium: 132 mEq/L — ABNORMAL LOW (ref 135–145)
Total Bilirubin: 3 mg/dL — ABNORMAL HIGH (ref 0.3–1.2)
Total Protein: 6.9 g/dL (ref 6.0–8.3)

## 2012-02-25 LAB — PROTIME-INR
INR: 0.87 (ref 0.00–1.49)
Prothrombin Time: 12 seconds (ref 11.6–15.2)

## 2012-02-25 LAB — GLUCOSE, CAPILLARY
Glucose-Capillary: 106 mg/dL — ABNORMAL HIGH (ref 70–99)
Glucose-Capillary: 142 mg/dL — ABNORMAL HIGH (ref 70–99)

## 2012-02-25 LAB — TRIGLYCERIDES: Triglycerides: 504 mg/dL — ABNORMAL HIGH (ref <150)

## 2012-02-25 MED ORDER — NICOTINE 14 MG/24HR TD PT24
14.0000 mg | MEDICATED_PATCH | Freq: Every day | TRANSDERMAL | Status: DC
  Administered 2012-02-25 – 2012-02-29 (×5): 14 mg via TRANSDERMAL
  Filled 2012-02-25 (×5): qty 1

## 2012-02-25 MED ORDER — LORAZEPAM 1 MG PO TABS: 1.0000 mg | ORAL_TABLET | Freq: Four times a day (QID) | ORAL | Status: DC | PRN

## 2012-02-25 MED ORDER — DOCUSATE SODIUM 100 MG PO CAPS
100.0000 mg | ORAL_CAPSULE | Freq: Two times a day (BID) | ORAL | Status: DC
  Administered 2012-02-25 – 2012-02-28 (×6): 100 mg via ORAL
  Filled 2012-02-25 (×7): qty 1

## 2012-02-25 MED ORDER — VITAMIN B-1 100 MG PO TABS
100.0000 mg | ORAL_TABLET | Freq: Every day | ORAL | Status: DC
  Administered 2012-02-25 – 2012-02-29 (×4): 100 mg via ORAL
  Filled 2012-02-25 (×5): qty 1

## 2012-02-25 MED ORDER — TRAZODONE HCL 50 MG PO TABS
50.0000 mg | ORAL_TABLET | Freq: Every day | ORAL | Status: DC
  Administered 2012-02-25: 50 mg via ORAL
  Filled 2012-02-25: qty 1

## 2012-02-25 NOTE — Progress Notes (Signed)
INITIAL ADULT NUTRITION ASSESSMENT Date: 02/25/2012   Time: 2:31 PM  Reason for Assessment: Nutrition Risk/ Unplanned wt loss > 10 # x 30d  ASSESSMENT: Male 51 y.o.  Dx: Acute Pancreatitis  Hx:  Past Medical History  Diagnosis Date  . Bronchitis   . Acid reflux   . Pancreatitis   . Gout   . Bipolar disorder   . Hyperlipidemia   . Alcohol abuse     Scheduled Meds:   . enoxaparin  40 mg Subcutaneous Q24H  . fluticasone  2 spray Each Nare BID  . sodium chloride       Continuous Infusions:   . sodium chloride 100 mL/hr at 02/25/12 0953   PRN Meds:.alum & mag hydroxide-simeth, HYDROmorphone (DILAUDID) injection, magnesium hydroxide, promethazine  Ht: 5\' 9"  (175.3 cm)  Wt: 179 lb 0.2 oz (81.2 kg)  Ideal Wt:  70.7 kg % Ideal Wt: 114%  Usual Wt: 192# % Usual Wt: 93%  Body mass index is 26.44 kg/(m^2).  Food/Nutrition Related Hx: ETOH abuse noted. Pt reports good appetite prior to "being sick with virus" recently. Significant weight loss 13#, 7% x 30d.   CMP     Component Value Date/Time   NA 132* 02/25/2012 0606   K 3.4* 02/25/2012 0606   CL 92* 02/25/2012 0606   CO2 27 02/25/2012 0606   GLUCOSE 101* 02/25/2012 0606   BUN 3* 02/25/2012 0606   CREATININE 0.86 02/25/2012 0606   CALCIUM 8.2* 02/25/2012 0606   PROT 6.9 02/25/2012 0606   ALBUMIN 2.8* 02/25/2012 0606   AST 121* 02/25/2012 0606   ALT 56* 02/25/2012 0606   ALKPHOS 128* 02/25/2012 0606   BILITOT 3.0* 02/25/2012 0606   GFRNONAA >90 02/25/2012 0606   GFRAA >90 02/25/2012 0606    Intake/Output Summary (Last 24 hours) at 02/25/12 1437 Last data filed at 02/25/12 0800  Gross per 24 hour  Intake   1561 ml  Output      0 ml  Net   1561 ml     Diet Order:  NPO currnetly  Supplements/Tube Feeding: None at this time  IVF:    sodium chloride Last Rate: 100 mL/hr at 02/25/12 0953    Estimated Nutritional Needs:   Kcal:2210-2550 kcal per day Protein: 113-122 gr per day Fluid:2.2-2.6 L/d  NUTRITION  DIAGNOSIS: -Inadequate oral intake (NI-2.1).  Status: Ongoing  RELATED TO: decr appetite and poor oral intake tol  AS EVIDENCE BY: N/V, abdominal pain prior to admission  MONITORING/EVALUATION(Goals): Monitor diet advancement and tol, labs and weight trends Goal is to successfully advance diet and consume sufficient energy and protein to maintain current wt and meet at least minimum est nutrition needs noted above.  EDUCATION NEEDS: -Education needs addressed  INTERVENTION: -RD will monitor advancement and tol of diet.  Dietitian [phone]  DOCUMENTATION CODES Per approved criteria  -Not Applicable    [name] [name] 02/25/2012, 2:31 PM

## 2012-02-25 NOTE — H&P (Signed)
Hospital Admission Note Date: 02/25/2012  Patient name: [name] [name] Medical record number: [id number] Date of birth: [birth year] Age: 51 y.o. Gender: male PCP: Milinda Antis, MD, MD  Attending physician: Christiane Ha, MD  Chief Complaint: Pancreatitis  History of Present Illness:  [name] [name] is an 51 y.o. male with history of recurrent pancreatitis related to alcohol and hypertriglyceridemia who presents with epigastric pain. He went to the ER earlier, got pain medication and felt better. However, his pain returned so he went to his primary care physician's office. His lipase was elevated, as were his LFTs in the ER. He's had several episodes of vomiting. He continues to drink alcohol but is rather vague about the specifics. He wishes to quit. He's been to rehabilitation previously. He's had no hematemesis or melena. His last drink was yesterday. He had previously been on gemfibrozil but apparently is not currently taking it. Previously, he was a patient of the health department, but has now established care with Dr. Jeanice Lim.  Past Medical History  Diagnosis Date  . Bronchitis   . Acid reflux   . Pancreatitis   . Gout   . Bipolar disorder   . Hyperlipidemia   . Alcohol abuse     Meds: Prescriptions prior to admission  Medication Sig Dispense Refill  . Aspirin-Salicylamide-Caffeine (BC HEADACHE POWDER PO) Take 1 packet by mouth as needed. For pain       . fexofenadine (ALLEGRA) 60 MG tablet Take 60 mg by mouth daily.      . fluticasone (FLONASE) 50 MCG/ACT nasal spray Place 1 spray into the nose 2 (two) times daily.  16 g  2  . omeprazole (PRILOSEC) 20 MG capsule Take 1 capsule (20 mg total) by mouth daily.  30 capsule  1  . traZODone (DESYREL) 100 MG tablet Take 100 mg by mouth at bedtime. Sleep      . oxyCODONE-acetaminophen (PERCOCET) 5-325 MG per tablet Take 1 tablet by mouth every 4 (four) hours as needed for pain.  20 tablet  0  . DISCONTD: fexofenadine (ALLEGRA) 180 MG  tablet Take 180 mg by mouth daily.        Allergies: Penicillins History   Social History  . Marital Status: Single    Spouse Name: N/A    Number of Children: N/A  . Years of Education: N/A   Occupational History  . Not on file.   Social History Main Topics  . Smoking status: Current Everyday Smoker -- 0.5 packs/day for 25 years  . Smokeless tobacco: Not on file  . Alcohol Use: 29.4 oz/week    35 Cans of beer, 14 Shots of liquor per week     Daily  . Drug Use: No  . Sexually Active:    Other Topics Concern  . Not on file   Social History Narrative  . No narrative on file   Family History  Problem Relation Age of Onset  . Diabetes Father    Past Surgical History  Procedure Date  . Nasal suregery     Review of Systems: Systems reviewed and as per HPI, otherwise negative.  Physical Exam: Blood pressure 118/80, pulse 89, temperature 98.8 F (37.1 C), temperature source Oral, resp. rate 20, height 5\' 9"  (1.753 m), weight 81.2 kg (179 lb 0.2 oz), SpO2 98.00%. BP 118/80  Pulse 89  Temp(Src) 98.8 F (37.1 C) (Oral)  Resp 20  Ht 5\' 9"  (1.753 m)  Wt 81.2 kg (179 lb 0.2 oz)  BMI 26.44 kg/m2  SpO2 98%  General Appearance:    groggy after pain medications. Arousable and appropriate.   Head:    Normocephalic, without obvious abnormality, atraumatic  Eyes:    PERRL, conjunctiva/corneas clear, EOM's intact, fundi    benign, both eyes          Nose:   Nares normal, septum midline, mucosa normal, no drainage    or sinus tenderness  Throat:   slightly dry mucous membranes   Neck:   Supple, symmetrical, trachea midline, no adenopathy;       thyroid:  No enlargement/tenderness/nodules; no carotid   bruit or JVD  Back:     Symmetric, no curvature, ROM normal, no CVA tenderness  Lungs:     Clear to auscultation bilaterally, respirations unlabored  Chest wall:    No tenderness or deformity  Heart:    Regular rate and rhythm, S1 and S2 normal, no murmur, rub   or gallop    Abdomen:     Soft, flat. Normal bowel sounds. Epigastric tenderness.   Genitalia:   deferred   Rectal:   deferred   Extremities:   Extremities normal, atraumatic, no cyanosis or edema  Pulses:   2+ and symmetric all extremities  Skin:   Skin color, texture, turgor normal, no rashes or lesions  Lymph nodes:   Cervical, supraclavicular, and axillary nodes normal  Neurologic:   CNII-XII intact. Normal strength, sensation and reflexes      throughout     Psychiatric: Normal affect. Cooperative.  Lab results: Basic Metabolic Panel:  Basename 02/24/12 0433  NA 132*  K 3.9  CL 94*  CO2 27  GLUCOSE 89  BUN 15  CREATININE 1.07  CALCIUM 8.7  MG --  PHOS --   Liver Function Tests:  [facility] 02/24/12 0433  AST 270*  ALT 75*  ALKPHOS 121*  BILITOT 1.9*  PROT 6.0  ALBUMIN 2.8*    Basename 02/24/12 0433  LIPASE 181*  AMYLASE --   No results found for this basename: AMMONIA:2 in the last 72 hours CBC:  Basename 02/24/12 0433  WBC 6.4  NEUTROABS --  HGB 12.4*  HCT 35.2*  MCV 93.4  PLT 312   Cardiac Enzymes: No results found for this basename: CKTOTAL:3,CKMB:3,CKMBINDEX:3,TROPONINI:3 in the last 72 hours BNP: No results found for this basename: PROBNP:3 in the last 72 hours D-Dimer: No results found for this basename: DDIMER:2 in the last 72 hours CBG:  Basename 02/25/12 1157 02/25/12 0748 02/24/12 2029 02/24/12 1657  GLUCAP 142* 106* 84 95   Hemoglobin A1C: No results found for this basename: HGBA1C in the last 72 hours Fasting Lipid Panel:  Basename 02/25/12 0607  CHOL --  HDL --  LDLCALC --  TRIG 504*  CHOLHDL --  LDLDIRECT --   Thyroid Function Tests: No results found for this basename: TSH,T4TOTAL,FREET4,T3FREE,THYROIDAB in the last 72 hours Anemia Panel: No results found for this basename: VITAMINB12,FOLATE,FERRITIN,TIBC,IRON,RETICCTPCT in the last 72 hours Coagulation:  Basename 02/25/12 0606  LABPROT 12.0  INR 0.87   Urine Drug  Screen: Drugs of Abuse     Component Value Date/Time   LABOPIA NONE DETECTED 06/09/2009 1845   COCAINSCRNUR POSITIVE* 06/09/2009 1845   LABBENZ NONE DETECTED 06/09/2009 1845   AMPHETMU NONE DETECTED 06/09/2009 1845   THCU NONE DETECTED 06/09/2009 1845   LABBARB  Value: NONE DETECTED        DRUG SCREEN FOR MEDICAL PURPOSES ONLY.  IF CONFIRMATION IS NEEDED FOR ANY PURPOSE, NOTIFY LAB WITHIN 5 DAYS.  LOWEST DETECTABLE LIMITS FOR URINE DRUG SCREEN Drug Class       Cutoff (ng/mL) Amphetamine      1000 Barbiturate      200 Benzodiazepine   200 Tricyclics       300 Opiates          300 Cocaine          300 THC              50 06/09/2009 1845    Alcohol Level: No results found for this basename: ETH:2 in the last 72 hours Urinalysis:  Basename 02/24/12 0617  COLORURINE YELLOW  LABSPEC 1.010  PHURINE 6.0  GLUCOSEU NEGATIVE  HGBUR NEGATIVE  BILIRUBINUR SMALL*  KETONESUR TRACE*  PROTEINUR NEGATIVE  UROBILINOGEN 0.2  NITRITE NEGATIVE  LEUKOCYTESUR NEGATIVE   Imaging results:  No results found.  Assessment & Plan: Active Problems:  Acute pancreatitis  Hypertriglyceridemia  Abnormal LFTs  Alcohol abuse  Allergic rhinitis  GERD (gastroesophageal reflux disease)  N.p.o. IV fluids. Antiemetics and pain medications. Social work consult for alcohol treatment. Monitor for withdrawal. Follow liver function tests. His last triglyceride level was only minimally elevated. I will recheck this. Consider further imaging if LFTs remain elevated. However I suspect this is all alcohol related.  Haydon Kalmar L 02/25/2012, 2:14 PM

## 2012-02-25 NOTE — Progress Notes (Signed)
Met with patient and his fiance at bedside. He acknowledges his etoh abuse and is open to Inpt. Detox. CSW Psychosocial Assessment placed in paper chart. Message sent to MD for ACT team consult.  Reece Levy, MSW, Theresia Majors [phone]

## 2012-02-25 NOTE — Progress Notes (Signed)
1830 MD NADA paged with no return for floor transfer orders.

## 2012-02-25 NOTE — Progress Notes (Signed)
Subjective: Pain improved today. Hungry. No vomiting. No withdrawal symptoms.  Objective: Vital signs in last 24 hours: Filed Vitals:   02/24/12 1710 02/24/12 2309 02/25/12 0559  BP: 143/98 130/78 118/80  Pulse: 88 93 89  Temp: 98.9 F (37.2 C) 98.7 F (37.1 C) 98.8 F (37.1 C)  TempSrc: Oral Oral Oral  Resp: 16 16 20   Height: 5\' 9"  (1.753 m)    Weight: 81.2 kg (179 lb 0.2 oz)    SpO2: 96% 98% 98%   Weight change:   Intake/Output Summary (Last 24 hours) at 02/25/12 1443 Last data filed at 02/25/12 0800  Gross per 24 hour  Intake   1561 ml  Output      0 ml  Net   1561 ml   Physical Exam: Gen.: Comfortable. Take standing and watching TV. Lungs clear auscultation bilaterally without wheeze rhonchi or rales Cardiovascular regular rate rhythm without murmurs gallops rubs Abdomen soft minimal epigastric tenderness nondistended normal bowel sounds Extremities no clubbing cyanosis or edema note tremulousness  Lab Results: Basic Metabolic Panel:  Lab 02/25/12 1610 02/24/12 0433  NA 132* 132*  K 3.4* 3.9  CL 92* 94*  CO2 27 27  GLUCOSE 101* 89  BUN 3* 15  CREATININE 0.86 1.07  CALCIUM 8.2* 8.7  MG -- --  PHOS -- --   Liver Function Tests:  Lab 02/25/12 0606 02/24/12 0433  AST 121* 270*  ALT 56* 75*  ALKPHOS 128* 121*  BILITOT 3.0* 1.9*  PROT 6.9 6.0  ALBUMIN 2.8* 2.8*    Lab 02/24/12 0433  LIPASE 181*  AMYLASE --   No results found for this basename: AMMONIA:2 in the last 168 hours CBC:  Lab 02/24/12 0433  WBC 6.4  NEUTROABS --  HGB 12.4*  HCT 35.2*  MCV 93.4  PLT 312   Cardiac Enzymes: No results found for this basename: CKTOTAL:3,CKMB:3,CKMBINDEX:3,TROPONINI:3 in the last 168 hours BNP: No results found for this basename: PROBNP:3 in the last 168 hours D-Dimer: No results found for this basename: DDIMER:2 in the last 168 hours CBG:  Lab 02/25/12 1157 02/25/12 0748 02/24/12 2029 02/24/12 1657  GLUCAP 142* 106* 84 95   Hemoglobin A1C: No  results found for this basename: HGBA1C in the last 168 hours Fasting Lipid Panel:  Lab 02/25/12 0607  CHOL --  HDL --  LDLCALC --  TRIG 504*  CHOLHDL --  LDLDIRECT --   Thyroid Function Tests: No results found for this basename: TSH,T4TOTAL,FREET4,T3FREE,THYROIDAB in the last 168 hours Coagulation:  Lab 02/25/12 0606  LABPROT 12.0  INR 0.87   Anemia Panel: No results found for this basename: VITAMINB12,FOLATE,FERRITIN,TIBC,IRON,RETICCTPCT in the last 168 hours Urine Drug Screen: Drugs of Abuse     Component Value Date/Time   LABOPIA NONE DETECTED 06/09/2009 1845   COCAINSCRNUR POSITIVE* 06/09/2009 1845   LABBENZ NONE DETECTED 06/09/2009 1845   AMPHETMU NONE DETECTED 06/09/2009 1845   THCU NONE DETECTED 06/09/2009 1845   LABBARB  Value: NONE DETECTED        DRUG SCREEN FOR MEDICAL PURPOSES ONLY.  IF CONFIRMATION IS NEEDED FOR ANY PURPOSE, NOTIFY LAB WITHIN 5 DAYS.        LOWEST DETECTABLE LIMITS FOR URINE DRUG SCREEN Drug Class       Cutoff (ng/mL) Amphetamine      1000 Barbiturate      200 Benzodiazepine   200 Tricyclics       300 Opiates          300 Cocaine  300 THC              50 06/09/2009 1845    Alcohol Level: No results found for this basename: ETH:2 in the last 168 hours Urinalysis:  Lab 02/24/12 0617  COLORURINE YELLOW  LABSPEC 1.010  PHURINE 6.0  GLUCOSEU NEGATIVE  HGBUR NEGATIVE  BILIRUBINUR SMALL*  KETONESUR TRACE*  PROTEINUR NEGATIVE  UROBILINOGEN 0.2  NITRITE NEGATIVE  LEUKOCYTESUR NEGATIVE    Scheduled Meds:   . docusate sodium  100 mg Oral BID  . enoxaparin  40 mg Subcutaneous Q24H  . fluticasone  2 spray Each Nare BID  . sodium chloride       Continuous Infusions:   . sodium chloride 100 mL/hr at 02/25/12 0953   PRN Meds:.alum & mag hydroxide-simeth, HYDROmorphone (DILAUDID) injection, magnesium hydroxide, promethazine Assessment/Plan: Active Problems:  Acute pancreatitis  Hypertriglyceridemia  Abnormal LFTs  Allergic rhinitis   GERD (gastroesophageal reflux disease)  Alcohol abuse  hypokalemia  Start clears. Consult act team for alcohol treatment. Replete potassium. Liver function tests improving. No signs of alcohol withdrawal.   LOS: 1 day   Dava Rensch L 02/25/2012, 2:43 PM

## 2012-02-26 LAB — COMPREHENSIVE METABOLIC PANEL
ALT: 33 U/L (ref 0–53)
AST: 57 U/L — ABNORMAL HIGH (ref 0–37)
Albumin: 2.3 g/dL — ABNORMAL LOW (ref 3.5–5.2)
Alkaline Phosphatase: 115 U/L (ref 39–117)
BUN: <3 mg/dL — ABNORMAL LOW (ref 6–23)
CO2: 32 mEq/L (ref 19–32)
Calcium: 8.2 mg/dL — ABNORMAL LOW (ref 8.4–10.5)
Chloride: 96 mEq/L (ref 96–112)
Creatinine, Ser: 0.83 mg/dL (ref 0.50–1.35)
GFR calc Af Amer: >90 mL/min (ref >90)
GFR calc non Af Amer: >90 mL/min (ref >90)
Glucose, Bld: 122 mg/dL — ABNORMAL HIGH (ref 70–99)
Potassium: 3.1 mEq/L — ABNORMAL LOW (ref 3.5–5.1)
Sodium: 134 mEq/L — ABNORMAL LOW (ref 135–145)
Total Bilirubin: 2.8 mg/dL — ABNORMAL HIGH (ref 0.3–1.2)
Total Protein: 6 g/dL (ref 6.0–8.3)

## 2012-02-26 LAB — LIPASE, BLOOD: Lipase: 89 U/L — ABNORMAL HIGH (ref 11–59)

## 2012-02-26 LAB — MAGNESIUM: Magnesium: 2.1 mg/dL (ref 1.5–2.5)

## 2012-02-26 MED ORDER — GEMFIBROZIL 600 MG PO TABS
600.0000 mg | ORAL_TABLET | Freq: Two times a day (BID) | ORAL | Status: DC
  Administered 2012-02-26 – 2012-02-29 (×6): 600 mg via ORAL
  Filled 2012-02-26 (×6): qty 1

## 2012-02-26 MED ORDER — PROMETHAZINE HCL 12.5 MG PO TABS
12.5000 mg | ORAL_TABLET | Freq: Four times a day (QID) | ORAL | Status: DC | PRN
Start: 2012-02-26 — End: 2012-02-29

## 2012-02-26 MED ORDER — OXYCODONE HCL 5 MG PO TABS
5.0000 mg | ORAL_TABLET | ORAL | Status: DC | PRN
  Administered 2012-02-26 – 2012-02-29 (×11): 5 mg via ORAL
  Filled 2012-02-26 (×11): qty 1

## 2012-02-26 MED ORDER — SENNA 8.6 MG PO TABS
1.0000 | ORAL_TABLET | Freq: Every day | ORAL | Status: DC
  Administered 2012-02-26: 8.6 mg via ORAL
  Filled 2012-02-26: qty 1

## 2012-02-26 MED ORDER — POTASSIUM CHLORIDE CRYS ER 20 MEQ PO TBCR
40.0000 meq | EXTENDED_RELEASE_TABLET | Freq: Three times a day (TID) | ORAL | Status: DC
  Administered 2012-02-26 – 2012-02-28 (×5): 40 meq via ORAL
  Filled 2012-02-26 (×3): qty 2
  Filled 2012-02-26: qty 1
  Filled 2012-02-26 (×2): qty 2

## 2012-02-26 MED ORDER — TRAZODONE HCL 50 MG PO TABS
50.0000 mg | ORAL_TABLET | Freq: Every evening | ORAL | Status: DC | PRN
  Administered 2012-02-26 – 2012-02-28 (×3): 50 mg via ORAL
  Filled 2012-02-26 (×3): qty 1

## 2012-02-26 NOTE — Progress Notes (Signed)
Discussed with ACT team. Subjective: Pain improved today. Hungry. No vomiting. No withdrawal symptoms.  Objective: Vital signs in last 24 hours: Filed Vitals:   02/24/12 2309 02/25/12 0559 02/25/12 2300 02/26/12 0610  BP: 130/78 118/80 122/61 112/74  Pulse: 93 89 96 106  Temp: 98.7 F (37.1 C) 98.8 F (37.1 C) 98.7 F (37.1 C) 99 F (37.2 C)  TempSrc: Oral Oral    Resp: 16 20 18 20   Height:      Weight:      SpO2: 98% 98% 96% 97%   Weight change:   Intake/Output Summary (Last 24 hours) at 02/26/12 1232 Last data filed at 02/26/12 0406  Gross per 24 hour  Intake   2010 ml  Output      0 ml  Net   2010 ml   Physical Exam: Gen.: Comfortable. Take standing and watching TV. Lungs clear auscultation bilaterally without wheeze rhonchi or rales Cardiovascular regular rate rhythm without murmurs gallops rubs Abdomen soft minimal epigastric tenderness nondistended normal bowel sounds Extremities no clubbing cyanosis or edema note tremulousness  Lab Results: Basic Metabolic Panel:  Lab 02/26/12 1610 02/25/12 0606  NA 134* 132*  K 3.1* 3.4*  CL 96 92*  CO2 32 27  GLUCOSE 122* 101*  BUN <3* 3*  CREATININE 0.83 0.86  CALCIUM 8.2* 8.2*  MG -- --  PHOS -- --   Liver Function Tests:  Lab 02/26/12 0815 02/25/12 0606  AST 57* 121*  ALT 33 56*  ALKPHOS 115 128*  BILITOT 2.8* 3.0*  PROT 6.0 6.9  ALBUMIN 2.3* 2.8*    Lab 02/26/12 0815 02/24/12 0433  LIPASE 89* 181*  AMYLASE -- --   No results found for this basename: AMMONIA:2 in the last 168 hours CBC:  Lab 02/24/12 0433  WBC 6.4  NEUTROABS --  HGB 12.4*  HCT 35.2*  MCV 93.4  PLT 312   Cardiac Enzymes: No results found for this basename: CKTOTAL:3,CKMB:3,CKMBINDEX:3,TROPONINI:3 in the last 168 hours BNP: No results found for this basename: PROBNP:3 in the last 168 hours D-Dimer: No results found for this basename: DDIMER:2 in the last 168 hours CBG:  Lab 02/25/12 1157 02/25/12 0748 02/24/12 2029  02/24/12 1657  GLUCAP 142* 106* 84 95   Hemoglobin A1C: No results found for this basename: HGBA1C in the last 168 hours Fasting Lipid Panel:  Lab 02/25/12 0607  CHOL --  HDL --  LDLCALC --  TRIG 504*  CHOLHDL --  LDLDIRECT --   Thyroid Function Tests: No results found for this basename: TSH,T4TOTAL,FREET4,T3FREE,THYROIDAB in the last 168 hours Coagulation:  Lab 02/25/12 0606  LABPROT 12.0  INR 0.87   Anemia Panel: No results found for this basename: VITAMINB12,FOLATE,FERRITIN,TIBC,IRON,RETICCTPCT in the last 168 hours Urine Drug Screen: Drugs of Abuse     Component Value Date/Time   LABOPIA NONE DETECTED 06/09/2009 1845   COCAINSCRNUR POSITIVE* 06/09/2009 1845   LABBENZ NONE DETECTED 06/09/2009 1845   AMPHETMU NONE DETECTED 06/09/2009 1845   THCU NONE DETECTED 06/09/2009 1845   LABBARB  Value: NONE DETECTED        DRUG SCREEN FOR MEDICAL PURPOSES ONLY.  IF CONFIRMATION IS NEEDED FOR ANY PURPOSE, NOTIFY LAB WITHIN 5 DAYS.        LOWEST DETECTABLE LIMITS FOR URINE DRUG SCREEN Drug Class       Cutoff (ng/mL) Amphetamine      1000 Barbiturate      200 Benzodiazepine   200 Tricyclics       300  Opiates          300 Cocaine          300 THC              50 06/09/2009 1845    Alcohol Level: No results found for this basename: ETH:2 in the last 168 hours Urinalysis:  Lab 02/24/12 0617  COLORURINE YELLOW  LABSPEC 1.010  PHURINE 6.0  GLUCOSEU NEGATIVE  HGBUR NEGATIVE  BILIRUBINUR SMALL*  KETONESUR TRACE*  PROTEINUR NEGATIVE  UROBILINOGEN 0.2  NITRITE NEGATIVE  LEUKOCYTESUR NEGATIVE    Scheduled Meds:    . docusate sodium  100 mg Oral BID  . enoxaparin  40 mg Subcutaneous Q24H  . fluticasone  2 spray Each Nare BID  . gemfibrozil  600 mg Oral BID AC  . nicotine  14 mg Transdermal Daily  . potassium chloride  40 mEq Oral TID  . senna  1 tablet Oral QHS  . thiamine  100 mg Oral Daily  . DISCONTD: traZODone  50 mg Oral QHS   Continuous Infusions:    . DISCONTD: sodium  chloride 100 mL/hr at 02/26/12 0929   PRN Meds:.alum & mag hydroxide-simeth, LORazepam, magnesium hydroxide, oxyCODONE, promethazine, traZODone, DISCONTD:  HYDROmorphone (DILAUDID) injection, DISCONTD: promethazine Assessment/Plan: Active Problems:  Acute pancreatitis  Hypertriglyceridemia  Abnormal LFTs  Allergic rhinitis  GERD (gastroesophageal reflux disease)  Alcohol abuse  hypokalemia  Advance to low-fat diet. Change medications to by mouth. Resume gemfibrozil. Stop antibiotics. Outpatient alcohol treatment upon discharge, and 12 step program. Replete potassium. Liver function tests improving. No signs of alcohol withdrawal. Home tomorrow if stable.   LOS: 2 days   Aristotle Lieb L 02/26/2012, 12:32 PM

## 2012-02-26 NOTE — Progress Notes (Signed)
Patient told RN that he usually takes a trazodone 50mg  at bedtime to sleep. Doctor was notified, and new orders given for trazodone 50mg  at bedtime.

## 2012-02-26 NOTE — BH Assessment (Signed)
Assessment Note   [name] [name] is an 51 y.o. male. He came to the hospital with problems associated with his liver as the result of alcohol abuse. He has been drinking since age [age]. He is drinking a 6 pack daily when working and at least a 12 daily when he is not drinking. He also has a history of mental illness, which is treated by [facility] staff. He denies any SI or HI. He does  report visual and auditory hallucinations from time to time. Both he and his girlfriend report similar events that they think may be because their home is haunted.  He has been to [employer] in the past but doubts their effectiveness, but is willing to try them again. He has no withdrawal symptoms since coming to the hospital. He denies any history of seizures. He denies any history of DT's.  Axis I:  Alcohol Dependence;Bipolar Disorder Depressed Axis II: Deferred Axis III:  Past Medical History  Diagnosis Date  . Bronchitis   . Acid reflux   . Pancreatitis   . Gout   . Bipolar disorder   . Hyperlipidemia   . Alcohol abuse    Axis IV: economic problems, occupational problems, problems related to social environment and problems with access to health care services Axis V: 41-50 serious symptoms  Past Medical History:  Past Medical History  Diagnosis Date  . Bronchitis   . Acid reflux   . Pancreatitis   . Gout   . Bipolar disorder   . Hyperlipidemia   . Alcohol abuse     Past Surgical History  Procedure Date  . Nasal suregery     Family History:  Family History  Problem Relation Age of Onset  . Diabetes Father     Social History:  reports that he has been smoking.  He does not have any smokeless tobacco history on file. He reports that he drinks about 29.4 ounces of alcohol per week. He reports that he does not use illicit drugs.  Additional Social History:    Allergies:  Allergies  Allergen Reactions  . Penicillins Itching    Home Medications:  Medications Prior to Admission    Medication Dose Route Frequency Provider Last Rate Last Dose  . 0.9 %  sodium chloride infusion   Intravenous Continuous Christiane Ha, MD 100 mL/hr at 02/26/12 0929    . alum & mag hydroxide-simeth (MAALOX/MYLANTA) 200-200-20 MG/5ML suspension 30 mL  30 mL Oral Q6H PRN Christiane Ha, MD      . docusate sodium (COLACE) capsule 100 mg  100 mg Oral BID Christiane Ha, MD   100 mg at 02/26/12 0924  . enoxaparin (LOVENOX) injection 40 mg  40 mg Subcutaneous Q24H Christiane Ha, MD   40 mg at 02/25/12 1722  . fluticasone (FLONASE) 50 MCG/ACT nasal spray 2 spray  2 spray Each Nare BID Christiane Ha, MD   2 spray at 02/26/12 [phone]  . HYDROmorphone (DILAUDID) injection 1 mg  1 mg Intravenous Once Raeford Razor, MD   1 mg at 02/24/12 0426  . HYDROmorphone (DILAUDID) injection 1 mg  1 mg Intravenous Once Raeford Razor, MD   1 mg at 02/24/12 0516  . HYDROmorphone (DILAUDID) injection 1 mg  1 mg Intravenous Q3H PRN Christiane Ha, MD   1 mg at 02/26/12 1047  . LORazepam (ATIVAN) tablet 1 mg  1 mg Oral Q6H PRN Christiane Ha, MD      . magnesium hydroxide (  MILK OF MAGNESIA) suspension 30 mL  30 mL Oral Daily PRN Christiane Ha, MD   30 mL at 02/26/12 0608  . nicotine (NICODERM CQ - dosed in mg/24 hours) patch 14 mg  14 mg Transdermal Daily Christiane Ha, MD   14 mg at 02/26/12 0925  . ondansetron (ZOFRAN) injection 4 mg  4 mg Intravenous Once Raeford Razor, MD   4 mg at 02/24/12 0426  . promethazine (PHENERGAN) injection 12.5 mg  12.5 mg Intravenous Q6H PRN Christiane Ha, MD      . sodium chloride 0.9 % bolus 1,000 mL  1,000 mL Intravenous Once Raeford Razor, MD   1,000 mL at 02/24/12 0426  . sodium chloride 0.9 % injection        10 mL at 02/24/12 1632  . thiamine (VITAMIN B-1) tablet 100 mg  100 mg Oral Daily Christiane Ha, MD   100 mg at 02/26/12 0924  . traZODone (DESYREL) tablet 50 mg  50 mg Oral QHS Simbiso Ranga, MD   50 mg at 02/25/12 2239    Medications Prior to Admission  Medication Sig Dispense Refill  . Aspirin-Salicylamide-Caffeine (BC HEADACHE POWDER PO) Take 1 packet by mouth as needed. For pain       . fluticasone (FLONASE) 50 MCG/ACT nasal spray Place 1 spray into the nose 2 (two) times daily.  16 g  2  . omeprazole (PRILOSEC) 20 MG capsule Take 1 capsule (20 mg total) by mouth daily.  30 capsule  1  . traZODone (DESYREL) 100 MG tablet Take 100 mg by mouth at bedtime. Sleep      . oxyCODONE-acetaminophen (PERCOCET) 5-325 MG per tablet Take 1 tablet by mouth every 4 (four) hours as needed for pain.  20 tablet  0    OB/GYN Status:  No LMP for male patient.  General Assessment Data Location of Assessment: [facility] (unit 300) ACT Assessment: Yes Living Arrangements: Spouse/significant other Can pt return to current living arrangement?: Yes Admission Status: Voluntary Is patient capable of signing voluntary admission?: Yes Transfer from: Acute Hospital Referral Source: Medical Floor Inpatient  Education Status Is patient currently in school?: No  Risk to self Suicidal Ideation: No Suicidal Intent: No Is patient at risk for suicide?: No Suicidal Plan?: No Access to Means: No What has been your use of drugs/alcohol within the last 12 months?: alcohol Previous Attempts/Gestures: No Intentional Self Injurious Behavior: None Family Suicide History: No Recent stressful life event(s): Recent negative physical changes Persecutory voices/beliefs?: No Depression: Yes Depression Symptoms: Insomnia;Loss of interest in usual pleasures;Isolating Substance abuse history and/or treatment for substance abuse?: Yes Suicide prevention information given to non-admitted patients: Yes  Risk to Others Homicidal Ideation: No Thoughts of Harm to Others: No Current Homicidal Intent: No Current Homicidal Plan: No Access to Homicidal Means: No History of harm to others?: No Assessment of Violence: None Noted Does patient have  access to weapons?: No Criminal Charges Pending?: No Does patient have a court date: No  Psychosis Hallucinations: Visual;Auditory (these are off and on;treated by [facility]/Dr. Geanie Cooley) Delusions: None noted  Mental Status Report Appear/Hygiene: Improved Eye Contact: Good Motor Activity: Freedom of movement Speech: Logical/coherent Level of Consciousness: Alert Mood: Depressed Affect: Depressed;Anxious Anxiety Level: Minimal Thought Processes: Coherent;Relevant Judgement: Unimpaired Orientation: Person;Place;Time;Situation Obsessive Compulsive Thoughts/Behaviors: None  Cognitive Functioning Concentration: Normal Memory: Recent Intact;Remote Intact IQ: Average Insight: Good Impulse Control: Poor Appetite: Good Sleep: No Change Vegetative Symptoms: None  Prior Inpatient Therapy Prior Inpatient Therapy:  No  Prior Outpatient Therapy Prior Outpatient Therapy: Yes Prior Therapy Dates: current Prior Therapy Facilty/Provider(s): [facility] Reason for Treatment: Bipolar Polar Disorder;medications  ADL Screening (condition at time of admission) Patient's cognitive ability adequate to safely complete daily activities?: Yes Patient able to express need for assistance with ADLs?: Yes Independently performs ADLs?: Yes Weakness of Legs: Right (Torn Ligament in R Knee) Weakness of Arms/Hands: None  Home Assistive Devices/Equipment Home Assistive Devices/Equipment: Crutches;Eyeglasses  Therapy Consults (therapy consults require a physician order) PT Evaluation Needed: No OT Evalulation Needed: No SLP Evaluation Needed: No Abuse/Neglect Assessment (Assessment to be complete while patient is alone) Physical Abuse: Denies Verbal Abuse: Denies Sexual Abuse: Denies Exploitation of patient/patient's resources: Denies Self-Neglect: Denies Values / Beliefs Cultural Requests During Hospitalization: None Spiritual Requests During Hospitalization: None Consults Spiritual Care  Consult Needed: No Social Work Consult Needed: No Merchant navy officer (For Healthcare) Advance Directive: Patient does not have advance directive;Patient would like information Patient requests advance directive information: Advance directive packet given Pre-existing out of facility DNR order (yellow form or pink MOST form): No Nutrition Screen Diet: NPO;Ice chips and sips Unintentional weight loss greater than 10lbs within the last month: Yes (Comment) Dysphagia: No Home Tube Feeding or Total Parenteral Nutrition (TPN): No Patient appears severely malnourished: No Pregnant or Lactating: No Dietitian Consult Needed: No  Additional Information 1:1 In Past 12 Months?: No CIRT Risk: No Elopement Risk: No Does patient have medical clearance?: No     Disposition: Offered to the patient residential services, but he declined this offer. He is willing to continue his outpatient follow up and to resume AA meeting both at [facility] and at the local [facility]. Dr Lendell Caprice is in agreement with this plan and will evaluate the patient for possible dicharge later today. Disposition Disposition of Patient: Outpatient treatment;Referred to Type of outpatient treatment: Adult Patient referred to: Other (Comment) (resume follow up with [facility]; attend AA meeting4 x week)  On Site Evaluation by:   Reviewed with Physician:     Jearld Pies 02/26/2012 11:37 AM

## 2012-02-27 ENCOUNTER — Inpatient Hospital Stay (HOSPITAL_COMMUNITY): Payer: Medicare Other

## 2012-02-27 LAB — COMPREHENSIVE METABOLIC PANEL
ALT: 26 U/L (ref 0–53)
AST: 48 U/L — ABNORMAL HIGH (ref 0–37)
Albumin: 2.2 g/dL — ABNORMAL LOW (ref 3.5–5.2)
Alkaline Phosphatase: 107 U/L (ref 39–117)
BUN: <3 mg/dL — ABNORMAL LOW (ref 6–23)
CO2: 28 mEq/L (ref 19–32)
Calcium: 8.6 mg/dL (ref 8.4–10.5)
Chloride: 99 mEq/L (ref 96–112)
Creatinine, Ser: 0.77 mg/dL (ref 0.50–1.35)
GFR calc Af Amer: >90 mL/min (ref >90)
GFR calc non Af Amer: >90 mL/min (ref >90)
Glucose, Bld: 104 mg/dL — ABNORMAL HIGH (ref 70–99)
Potassium: 3.6 mEq/L (ref 3.5–5.1)
Sodium: 132 mEq/L — ABNORMAL LOW (ref 135–145)
Total Bilirubin: 2.1 mg/dL — ABNORMAL HIGH (ref 0.3–1.2)
Total Protein: 6 g/dL (ref 6.0–8.3)

## 2012-02-27 LAB — LIPASE, BLOOD: Lipase: 69 U/L — ABNORMAL HIGH (ref 11–59)

## 2012-02-27 MED ORDER — SODIUM CHLORIDE 0.9 % IJ SOLN
INTRAMUSCULAR | Status: AC
  Administered 2012-02-27: 3 mL
  Filled 2012-02-27: qty 3

## 2012-02-27 MED ORDER — PANTOPRAZOLE SODIUM 40 MG PO TBEC
40.0000 mg | DELAYED_RELEASE_TABLET | Freq: Every day | ORAL | Status: DC
  Administered 2012-02-27 – 2012-02-29 (×3): 40 mg via ORAL
  Filled 2012-02-27 (×3): qty 1

## 2012-02-27 MED ORDER — SODIUM CHLORIDE 0.9 % IV SOLN
80.0000 mg | Freq: Once | INTRAVENOUS | Status: AC
  Administered 2012-02-27: 80 mg via INTRAVENOUS
  Filled 2012-02-27: qty 80

## 2012-02-27 MED ORDER — SENNA 8.6 MG PO TABS
2.0000 | ORAL_TABLET | Freq: Every day | ORAL | Status: DC
  Administered 2012-02-27: 17.2 mg via ORAL
  Filled 2012-02-27 (×2): qty 1

## 2012-02-27 MED ORDER — HYDROMORPHONE HCL PF 1 MG/ML IJ SOLN
INTRAMUSCULAR | Status: AC
  Administered 2012-02-27: 1 mg via INTRAVENOUS
  Filled 2012-02-27: qty 1

## 2012-02-27 MED ORDER — HYDROMORPHONE HCL PF 1 MG/ML IJ SOLN
1.0000 mg | Freq: Once | INTRAMUSCULAR | Status: AC
  Administered 2012-02-27: 1 mg via INTRAVENOUS

## 2012-02-27 MED ORDER — IOHEXOL 300 MG/ML  SOLN
100.0000 mL | Freq: Once | INTRAMUSCULAR | Status: AC | PRN
  Administered 2012-02-27: 100 mL via INTRAVENOUS

## 2012-02-27 MED ORDER — HYDROMORPHONE HCL PF 1 MG/ML IJ SOLN
1.0000 mg | INTRAMUSCULAR | Status: DC | PRN
  Administered 2012-02-27 – 2012-02-29 (×13): 1 mg via INTRAVENOUS
  Filled 2012-02-27 (×13): qty 1

## 2012-02-27 NOTE — Progress Notes (Signed)
Physician notified this am that patient was complaining of abdominal pain/burning unrelieved by medicaiton. Also that pt was spitting up small amount of blood, and complaining of a migraine. Physician said he was giving no new orders, and primary physician to address. Later placed order for protonix. Sheryn Bison

## 2012-02-27 NOTE — Progress Notes (Signed)
Subjective: Pain worse after starting solid diet. Had a bowel movement but had to strain quite a bit. Requesting IV pain medication.  Objective: Vital signs in last 24 hours: Filed Vitals:   02/25/12 2300 02/26/12 0610 02/26/12 2155 02/27/12 0639  BP: 122/61 112/74 118/71 126/85  Pulse: 96 106 82 82  Temp: 98.7 F (37.1 C) 99 F (37.2 C) 99 F (37.2 C) 98.8 F (37.1 C)  TempSrc:      Resp: 18 20 20 20   Height:      Weight:      SpO2: 96% 97% 90% 98%   Weight change:   Intake/Output Summary (Last 24 hours) at 02/27/12 1107 Last data filed at 02/27/12 0330  Gross per 24 hour  Intake 1876.67 ml  Output      0 ml  Net 1876.67 ml   Physical Exam: General: No distress. Lungs clear auscultation bilaterally without wheeze rhonchi or rales Cardiovascular regular rate rhythm without murmurs gallops rubs Abdomen soft, epigastric tenderness nondistended normal bowel sounds Extremities no clubbing cyanosis or edema note tremulousness  Lab Results: Basic Metabolic Panel:  Lab 02/27/12 1610 02/26/12 0815  NA 132* 134*  K 3.6 3.1*  CL 99 96  CO2 28 32  GLUCOSE 104* 122*  BUN <3* <3*  CREATININE 0.77 0.83  CALCIUM 8.6 8.2*  MG -- 2.1  PHOS -- --   Liver Function Tests:  Lab 02/27/12 0619 02/26/12 0815  AST 48* 57*  ALT 26 33  ALKPHOS 107 115  BILITOT 2.1* 2.8*  PROT 6.0 6.0  ALBUMIN 2.2* 2.3*    Lab 02/26/12 0815 02/24/12 0433  LIPASE 89* 181*  AMYLASE -- --   No results found for this basename: AMMONIA:2 in the last 168 hours CBC:  Lab 02/24/12 0433  WBC 6.4  NEUTROABS --  HGB 12.4*  HCT 35.2*  MCV 93.4  PLT 312   Cardiac Enzymes: No results found for this basename: CKTOTAL:3,CKMB:3,CKMBINDEX:3,TROPONINI:3 in the last 168 hours BNP: No results found for this basename: PROBNP:3 in the last 168 hours D-Dimer: No results found for this basename: DDIMER:2 in the last 168 hours CBG:  Lab 02/25/12 1157 02/25/12 0748 02/24/12 2029 02/24/12 1657  GLUCAP  142* 106* 84 95   Hemoglobin A1C: No results found for this basename: HGBA1C in the last 168 hours Fasting Lipid Panel:  Lab 02/25/12 0607  CHOL --  HDL --  LDLCALC --  TRIG 504*  CHOLHDL --  LDLDIRECT --   Thyroid Function Tests: No results found for this basename: TSH,T4TOTAL,FREET4,T3FREE,THYROIDAB in the last 168 hours Coagulation:  Lab 02/25/12 0606  LABPROT 12.0  INR 0.87   Anemia Panel: No results found for this basename: VITAMINB12,FOLATE,FERRITIN,TIBC,IRON,RETICCTPCT in the last 168 hours Urine Drug Screen: Drugs of Abuse     Component Value Date/Time   LABOPIA NONE DETECTED 06/09/2009 1845   COCAINSCRNUR POSITIVE* 06/09/2009 1845   LABBENZ NONE DETECTED 06/09/2009 1845   AMPHETMU NONE DETECTED 06/09/2009 1845   THCU NONE DETECTED 06/09/2009 1845   LABBARB  Value: NONE DETECTED        DRUG SCREEN FOR MEDICAL PURPOSES ONLY.  IF CONFIRMATION IS NEEDED FOR ANY PURPOSE, NOTIFY LAB WITHIN 5 DAYS.        LOWEST DETECTABLE LIMITS FOR URINE DRUG SCREEN Drug Class       Cutoff (ng/mL) Amphetamine      1000 Barbiturate      200 Benzodiazepine   200 Tricyclics       300 Opiates  300 Cocaine          300 THC              50 06/09/2009 1845    Alcohol Level: No results found for this basename: ETH:2 in the last 168 hours Urinalysis:  Lab 02/24/12 0617  COLORURINE YELLOW  LABSPEC 1.010  PHURINE 6.0  GLUCOSEU NEGATIVE  HGBUR NEGATIVE  BILIRUBINUR SMALL*  KETONESUR TRACE*  PROTEINUR NEGATIVE  UROBILINOGEN 0.2  NITRITE NEGATIVE  LEUKOCYTESUR NEGATIVE    Scheduled Meds:    . docusate sodium  100 mg Oral BID  . enoxaparin  40 mg Subcutaneous Q24H  . fluticasone  2 spray Each Nare BID  . gemfibrozil  600 mg Oral BID AC  .  HYDROmorphone (DILAUDID) injection  1 mg Intravenous Once  . nicotine  14 mg Transdermal Daily  . pantoprazole (PROTONIX) IV  80 mg Intravenous Once  . pantoprazole  40 mg Oral Q1200  . potassium chloride  40 mEq Oral TID  . senna  2 tablet  Oral QHS  . thiamine  100 mg Oral Daily  . DISCONTD: senna  1 tablet Oral QHS  . DISCONTD: traZODone  50 mg Oral QHS   Continuous Infusions:    . DISCONTD: sodium chloride 100 mL/hr at 02/26/12 0929   PRN Meds:.alum & mag hydroxide-simeth, HYDROmorphone (DILAUDID) injection, LORazepam, magnesium hydroxide, oxyCODONE, promethazine, traZODone, DISCONTD:  HYDROmorphone (DILAUDID) injection, DISCONTD: promethazine Assessment/Plan:   Acute pancreatitis  Hypertriglyceridemia  Abnormal LFTs  Allergic rhinitis  GERD (gastroesophageal reflux disease)  Alcohol abuse  hypokalemia, resolved  Pain worse after starting solids. Change back to IV pain medication. Clear liquid diet. The patient has not had imaging of his pancreas during this hospitalization.  will order CT of the pancreas. Liver function tests improving. Outpatient alcohol treatment upon discharge, and 12 step program.  No signs of alcohol withdrawal. Not ready for discharge.   LOS: 3 days   [name] [name] L 02/27/2012, 11:07 AM

## 2012-02-28 ENCOUNTER — Encounter (HOSPITAL_COMMUNITY): Payer: Self-pay | Admitting: Internal Medicine

## 2012-02-28 DIAGNOSIS — K863 Pseudocyst of pancreas: Secondary | ICD-10-CM | POA: Diagnosis present

## 2012-02-28 HISTORY — DX: Pseudocyst of pancreas: K86.3

## 2012-02-28 MED ORDER — SODIUM CHLORIDE 0.9 % IJ SOLN
INTRAMUSCULAR | Status: AC
  Administered 2012-02-28: 3 mL
  Filled 2012-02-28: qty 3

## 2012-02-28 MED ORDER — SODIUM CHLORIDE 0.9 % IJ SOLN
INTRAMUSCULAR | Status: AC
  Administered 2012-02-28: 10 mL
  Filled 2012-02-28: qty 3

## 2012-02-28 MED ORDER — SODIUM CHLORIDE 0.9 % IJ SOLN
INTRAMUSCULAR | Status: AC
  Administered 2012-02-28: 18:00:00
  Filled 2012-02-28: qty 3

## 2012-02-28 NOTE — Progress Notes (Signed)
Subjective: He is still having abdominal pain that appears to be worse at night. He has had multiple bowel movements over the past 24 hours and wants the laxatives placed on hold or discontinued. He denies nausea or vomiting.  Objective: Vital signs in last 24 hours: Filed Vitals:   02/27/12 0639 02/27/12 1400 02/27/12 2137 02/28/12 0501  BP: 126/85 123/77 124/78 128/85  Pulse: 82 89 90 80  Temp: 98.8 F (37.1 C) 98.1 F (36.7 C) 99 F (37.2 C) 98.5 F (36.9 C)  TempSrc:   Oral Oral  Resp: 20 20 20 20   Height:      Weight:      SpO2: 98% 94% 98% 100%    Intake/Output Summary (Last 24 hours) at 02/28/12 1250 Last data filed at 02/28/12 0900  Gross per 24 hour  Intake   1086 ml  Output      0 ml  Net   1086 ml    Weight change:   Physical exam: Lungs: Clear to auscultation bilaterally. Heart: S1, S2, with no murmurs rubs or gallops. Abdomen: Positive bowel sounds, soft, mildly to moderately tender in the epigastrium with no rebound, guarding, or distention. Extremities: No pedal edema. Neurologic: He is alert and oriented x3. Cranial nerves II through XII are intact. No tremulousness.  Lab Results: Basic Metabolic Panel:  Basename 02/27/12 0619 02/26/12 0815  NA 132* 134*  K 3.6 3.1*  CL 99 96  CO2 28 32  GLUCOSE 104* 122*  BUN <3* <3*  CREATININE 0.77 0.83  CALCIUM 8.6 8.2*  MG -- 2.1  PHOS -- --   Liver Function Tests:  [facility] 02/27/12 0619 02/26/12 0815  AST 48* 57*  ALT 26 33  ALKPHOS 107 115  BILITOT 2.1* 2.8*  PROT 6.0 6.0  ALBUMIN 2.2* 2.3*    Basename 02/27/12 1130 02/26/12 0815  LIPASE 69* 89*  AMYLASE -- --   No results found for this basename: AMMONIA:2 in the last 72 hours CBC: No results found for this basename: WBC:2,NEUTROABS:2,HGB:2,HCT:2,MCV:2,PLT:2 in the last 72 hours Cardiac Enzymes: No results found for this basename: CKTOTAL:3,CKMB:3,CKMBINDEX:3,TROPONINI:3 in the last 72 hours BNP: No results found for this basename:  PROBNP:3 in the last 72 hours D-Dimer: No results found for this basename: DDIMER:2 in the last 72 hours CBG: No results found for this basename: GLUCAP:6 in the last 72 hours Hemoglobin A1C: No results found for this basename: HGBA1C in the last 72 hours Fasting Lipid Panel: No results found for this basename: CHOL,HDL,LDLCALC,TRIG,CHOLHDL,LDLDIRECT in the last 72 hours Thyroid Function Tests: No results found for this basename: TSH,T4TOTAL,FREET4,T3FREE,THYROIDAB in the last 72 hours Anemia Panel: No results found for this basename: VITAMINB12,FOLATE,FERRITIN,TIBC,IRON,RETICCTPCT in the last 72 hours Coagulation: No results found for this basename: LABPROT:2,INR:2 in the last 72 hours Urine Drug Screen: Drugs of Abuse     Component Value Date/Time   LABOPIA NONE DETECTED 06/09/2009 1845   COCAINSCRNUR POSITIVE* 06/09/2009 1845   LABBENZ NONE DETECTED 06/09/2009 1845   AMPHETMU NONE DETECTED 06/09/2009 1845   THCU NONE DETECTED 06/09/2009 1845   LABBARB  Value: NONE DETECTED        DRUG SCREEN FOR MEDICAL PURPOSES ONLY.  IF CONFIRMATION IS NEEDED FOR ANY PURPOSE, NOTIFY LAB WITHIN 5 DAYS.        LOWEST DETECTABLE LIMITS FOR URINE DRUG SCREEN Drug Class       Cutoff (ng/mL) Amphetamine      1000 Barbiturate      200 Benzodiazepine   200  Tricyclics       300 Opiates          300 Cocaine          300 THC              50 06/09/2009 1845    Alcohol Level: No results found for this basename: ETH:2 in the last 72 hours Urinalysis: No results found for this basename: COLORURINE:2,APPERANCEUR:2,LABSPEC:2,PHURINE:2,GLUCOSEU:2,HGBUR:2,BILIRUBINUR:2,KETONESUR:2,PROTEINUR:2,UROBILINOGEN:2,NITRITE:2,LEUKOCYTESUR:2 in the last 72 hours Misc. Labs:   Micro: No results found for this or any previous visit (from the past 240 hour(s)).  Studies/Results: Ct Abdomen W Contrast  02/27/2012  *RADIOLOGY REPORT*  Clinical Data:   Upper abdominal pain.  Pancreatitis.  CT ABDOMEN WITH CONTRAST  Technique:   Multidetector CT imaging of the abdomen was performed using the standard protocol following bolus administration of intravenous contrast.  Contrast: OMNIPAQUE IOHEXOL 300 MG/ML IJ SOLN  Comparison:  12/10/2011.  Findings:  Lung bases demonstrate dependent atelectasis and linear subsegmental atelectasis.  Heart grossly appears normal.  Low attenuation of the liver suggesting hepatic steatosis.  The gallbladder appears normal.  There are no calcified gallstones. Common bile duct is within normal limits. Spleen normal.  Both adrenal glands are normal.  Pancreatic tail inflammatory changes are present with a 26 mm low attenuation fluid collection interposed between the tail of the pancreas and the posterior gastric fundus (image 20 series 2).  This is compatible with a small developing pseudocyst. There is a second developing pseudocyst in the inferior portion of the pancreatic tail measuring 19 mm (image 27 series 2).  Retroperitoneal fat stranding is present system with active pancreatitis.  The head and uncinate process appear normal.  There is no gas in the pancreatic bed to suggest infected pancreatic necrosis.  The portal vein is patent.  Splenic vein and artery appear within normal limits.  No vascular complications of pancreatitis.  Prominent collateral vessels are present in the left upper quadrant.  Visualized small and large bowel appears normal. A tiny portion of the appendix is visualized and appears normal. Aorta normal.  Bones appear within normal limits.  L3-L4 and L4-L5 degenerative disc disease with broad-based posterior disc bulging. No aggressive osseous lesions.  IMPRESSION: 1.  Recurrent pancreatitis with developing pseudocyst interposed between the pancreatic tail and posterior gastric wall.  Second smaller pseudocyst is present inferior to the pancreatic tail. 2.  Low attenuation of the liver compatible with hepatic steatosis. Constellation of findings suggest alcoholic pancreatitis. 3.  No  vascular complications of pancreatitis.  No pancreatic necrosis.  Original Report Authenticated By: Andreas Newport, M.D.    Medications: I have reviewed the patient's current medications.  Assessment: Active Problems:  Acute pancreatitis  Hypertriglyceridemia  Allergic rhinitis  GERD (gastroesophageal reflux disease)  Alcohol abuse  Abnormal LFTs  Pseudocyst of pancreas  Hypokalemia   1. Acute recurrent pancreatitis, secondary to alcohol abuse and hypertriglyceridemia. His lipase is decreasing.  Pancreatic pseudocyst as seen on the CT of the abdomen and pelvis yesterday.  Alcoholic hepatitis with evidence of hepatic steatosis on the CT of the abdomen and pelvis.  Alcohol abuse. He has been counseled. He is interested in outpatient treatment.  Hypertriglyceridemia. He is being treated with gemfibrozil. Next  Hypokalemia. Repleted.  Tobacco abuse. He is on nicotine replacement therapy. He was advised to quit.  Plan:   1. Continue supportive treatment. We'll hold off on advancing diet until he is mostly pain free. 2. We'll order laboratory studies in the morning.    LOS:  4 days   [name] [name] 02/28/2012, 12:50 PM

## 2012-02-29 LAB — COMPREHENSIVE METABOLIC PANEL
ALT: 24 U/L (ref 0–53)
AST: 41 U/L — ABNORMAL HIGH (ref 0–37)
Albumin: 2.9 g/dL — ABNORMAL LOW (ref 3.5–5.2)
Alkaline Phosphatase: 109 U/L (ref 39–117)
BUN: 4 mg/dL — ABNORMAL LOW (ref 6–23)
CO2: 28 mEq/L (ref 19–32)
Calcium: 9.7 mg/dL (ref 8.4–10.5)
Chloride: 95 mEq/L — ABNORMAL LOW (ref 96–112)
Creatinine, Ser: 0.89 mg/dL (ref 0.50–1.35)
GFR calc Af Amer: >90 mL/min (ref >90)
GFR calc non Af Amer: >90 mL/min (ref >90)
Glucose, Bld: 135 mg/dL — ABNORMAL HIGH (ref 70–99)
Potassium: 4 mEq/L (ref 3.5–5.1)
Sodium: 130 mEq/L — ABNORMAL LOW (ref 135–145)
Total Bilirubin: 1.5 mg/dL — ABNORMAL HIGH (ref 0.3–1.2)
Total Protein: 7.8 g/dL (ref 6.0–8.3)

## 2012-02-29 LAB — CBC
HCT: 37.8 % — ABNORMAL LOW (ref 39.0–52.0)
Hemoglobin: 12.5 g/dL — ABNORMAL LOW (ref 13.0–17.0)
MCH: 31.5 pg (ref 26.0–34.0)
MCHC: 33.1 g/dL (ref 30.0–36.0)
MCV: 95.2 fL (ref 78.0–100.0)
Platelets: 327 10*3/uL (ref 150–400)
RBC: 3.97 MIL/uL — ABNORMAL LOW (ref 4.22–5.81)
RDW: 17.5 % — ABNORMAL HIGH (ref 11.5–15.5)
WBC: 12.1 10*3/uL — ABNORMAL HIGH (ref 4.0–10.5)

## 2012-02-29 LAB — MAGNESIUM: Magnesium: 2.7 mg/dL — ABNORMAL HIGH (ref 1.5–2.5)

## 2012-02-29 LAB — LIPASE, BLOOD: Lipase: 55 U/L (ref 11–59)

## 2012-02-29 MED ORDER — SODIUM CHLORIDE 0.9 % IV SOLN: INTRAVENOUS | Status: DC

## 2012-02-29 MED ORDER — PROMETHAZINE HCL 12.5 MG PO TABS: 12.5000 mg | ORAL_TABLET | Freq: Four times a day (QID) | ORAL | Status: DC | PRN

## 2012-02-29 MED ORDER — SODIUM CHLORIDE 0.9 % IJ SOLN
INTRAMUSCULAR | Status: AC
  Administered 2012-02-29: 10 mL
  Filled 2012-02-29: qty 3

## 2012-02-29 MED ORDER — OXYCODONE-ACETAMINOPHEN 10-325 MG PO TABS: 1.0000 | ORAL_TABLET | ORAL | Status: AC | PRN

## 2012-02-29 MED ORDER — GEMFIBROZIL 600 MG PO TABS: 600.0000 mg | ORAL_TABLET | Freq: Two times a day (BID) | ORAL | Status: DC

## 2012-02-29 MED ORDER — SODIUM CHLORIDE 0.9 % IJ SOLN
INTRAMUSCULAR | Status: AC
  Administered 2012-02-29: 3 mL
  Filled 2012-02-29: qty 3

## 2012-02-29 NOTE — Discharge Instructions (Signed)
Alcohol and Nutrition °Nutrition serves two purposes. It provides energy. It also maintains body structure and function. Food supplies energy. It also provides the building blocks needed to replace worn or damaged cells. Alcoholics often eat poorly. This limits their supply of essential nutrients. This affects energy supply and structure maintenance. Alcohol also affects the body's nutrients in: °· Digestion.  °· Storage.  °· Using and getting rid of waste products.  °IMPAIRMENT OF NUTRIENT DIGESTION AND UTILIZATION  °· Once ingested, food must be broken down into small components (digested). Then it is available for energy. It helps maintain body structure and function. Digestion begins in the mouth. It continues in the stomach and intestines, with help from the pancreas. The nutrients from digested food are absorbed from the intestines into the blood. Then they are carried to the liver. The liver prepares nutrients for:  °· Immediate use.  °· Storage and future use.  °· Alcohol inhibits the breakdown of nutrients into usable molecules.  °· It decreases secretion of digestive enzymes from the pancreas.  °· Alcohol impairs nutrient absorption by damaging the cells lining the stomach and intestines.  °· It also interferes with moving some nutrients into the blood.  °· In addition, nutritional deficiencies themselves may lead to further absorption problems.  °· For example, folate deficiency changes the cells that line the small intestine. This impairs how water is absorbed. It also affects absorbed nutrients. These include glucose, sodium, and additional folate.  °· Even if nutrients are digested and absorbed, alcohol can prevent them from being fully used. It changes their transport, storage, and excretion. Impaired utilization of nutrients by alcoholics is indicated by:  °· Decreased liver stores of vitamins, such as vitamin A.  °· Increased excretion of nutrients such as fat.  °ALCOHOL AND ENERGY SUPPLY  °· Three  basic nutritional components found in food are:  °· Carbohydrates.  °· Proteins.  °· Fats.  °· These are used as energy. Some alcoholics take in as much as 50% of their total daily calories from alcohol. They often neglect important foods.  °· Even when enough food is eaten, alcohol can impair the ways the body controls blood sugar (glucose) levels. It may either increase or decrease blood sugar.  °· In non-diabetic alcoholics, increased blood sugar (hyperglycemia) is caused by poor insulin secretion. It is usually temporary.  °· Decreased blood sugar (hypoglycemia) can cause serious injury even if this condition is short-lived. Low blood sugar can happen when a fasting or malnourished person drinks alcohol. When there is no food to supply energy, stored sugar is used up. The products of alcohol inhibit forming glucose from other compounds such as amino acids. As a result, alcohol causes the brain and other body tissue to lack glucose. It is needed for energy and function.  °· Alcohol is an energy source. But how the body processes and uses the energy from alcohol is complex. Also, when alcohol is substituted for carbohydrates, subjects tend to lose weight. This indicates that they get less energy from alcohol than from food.  °ALCOHOL - MAINTAINING CELL STRUCTURE AND FUNCTION  °Structure °Cells are made mostly of protein. So an adequate protein diet is important for maintaining cell structure. This is especially true if cells are being damaged. Research indicates that alcohol affects protein nutrition by causing impaired: °· Digestion of proteins to amino acids.  °· Processing of amino acids by the small intestine and liver.  °· Synthesis of proteins from amino acids.  °· Protein   secretion by the liver.  °Function °Nutrients are essential for the body to function well. They provide the tools that the body needs to work well:  °· Proteins.  °· Vitamins.  °· Minerals.  °Alcohol can disrupt body function. It may cause  nutrient deficiencies. And it may interfere with the way nutrients are processed. °Vitamins °· Vitamins are essential to maintain growth and normal metabolism. They regulate many of the body`s processes. Chronic heavy drinking causes deficiencies in many vitamins. This is caused by eating less. And, in some cases, vitamins may be poorly absorbed. For example, alcohol inhibits fat absorption. It impairs how the vitamins A, E, and D are normally absorbed along with dietary fats. Not enough vitamin A may cause night blindness. Not enough vitamin D may cause softening of the bones.  °· Some alcoholics lack vitamins A, C, D, E, K, and the B vitamins. These are all involved in wound healing and cell maintenance. In particular, because vitamin K is necessary for blood clotting, lacking that vitamin can cause delayed clotting. The result is excess bleeding. Lacking other vitamins involved in brain function may cause severe neurological damage.  °Minerals °Deficiencies of minerals such as calcium, magnesium, iron, and zinc are common in alcoholics. The alcohol itself does not seem to affect how these minerals are absorbed. Rather, they seem to occur secondary to other alcohol-related problems, such as: °· Less calcium absorbed.  °· Not enough magnesium.  °· More urinary excretion.  °· Vomiting.  °· Diarrhea.  °· Not enough iron due to gastrointestinal bleeding.  °· Not enough zinc or losses related to other nutrient deficiencies.  °· Mineral deficiencies can cause a variety of medical consequences. These range from calcium-related bone disease to zinc-related night blindness and skin lesions.  °ALCOHOL, MALNUTRITION, AND MEDICAL COMPLICATIONS  °Liver Disease  °· Alcoholic liver damage is caused primarily by alcohol itself. But poor nutrition may increase the risk of alcohol-related liver damage. For example, nutrients normally found in the liver are known to be affected by drinking alcohol. These include carotenoids, which  are the major sources of vitamin A, and vitamin E compounds. Decreases in such nutrients may play some role in alcohol-related liver damage.  °Pancreatitis °· Research suggests that malnutrition may increase the risk of developing alcoholic pancreatitis. Research suggests that a diet lacking in protein may increase alcohol's damaging effect on the pancreas.  °Brain °· Nutritional deficiencies may have severe effects on brain function. These may be permanent. Specifically, thiamine deficiencies are often seen in alcoholics. They can cause severe neurological problems. These include:  °· Impaired movement.  °· Memory loss seen in Wernicke-Korsakoff syndrome.  °Pregnancy °· Alcohol has toxic effects on fetal development. It causes alcohol-related birth defects. They include fetal alcohol syndrome. Alcohol itself is toxic to the fetus. Also, the nutritional deficiency can affect how the fetus develops. That may compound the risk of developmental damage.  °· Nutritional needs during pregnancy are 10% to 30% greater than normal. Food intake can increase by as much as 140% to cover the needs of both mother and fetus. An alcoholic mother`s nutritional problems may adversely affect the nutrition of the fetus. And alcohol itself can also restrict nutrition flow to the fetus.  °NUTRITIONAL STATUS OF ALCOHOLICS  °Techniques for assessing nutritional status include: °· Taking body measurements to estimate fat reserves. They include:  °· Weight.  °· Height.  °· Mass.  °· Skin fold thickness.  °· Performing blood analysis to provide measurements of circulating:  °·   Proteins.   Vitamins.   Minerals.   These techniques tend to be imprecise. For many nutrients, there is no clear "cut-off" point that would allow an accurate definition of deficiency. So assessing the nutritional status of alcoholics is limited by these techniques. Dietary status may provide information about the risk of developing nutritional problems. Dietary  status is assessed by:   Taking patients' dietary histories.   Evaluating the amount and types of food they are eating.   It is difficult to determine what exact amount of alcohol begins to have damaging effects on nutrition. In general, moderate drinkers have 2 drinks or less per day. They seem to be at little risk for nutritional problems. Various medical disorders begin to appear at greater levels.   Research indicates that the majority of even the heaviest drinkers have few obvious nutritional deficiencies. Many alcoholics who are hospitalized for medical complications of their disease do have severe malnutrition. Alcoholics tend to eat poorly. Often they eat less than the amounts of food necessary to provide enough:   Carbohydrates.   Protein.   Fat.   Vitamins A and C.   B vitamins.   Minerals like calcium and iron.  Of major concern is alcohol's effect on digesting food and use of nutrients. It may shift a mildly malnourished person toward severe malnutrition. Document Released: 10/03/2005 Document Revised: 11/28/2011 Document Reviewed: 03/19/2006 [facility] Patient Information 2012 [place], [place].

## 2012-02-29 NOTE — Plan of Care (Signed)
Problem: Discharge Progression Outcomes Goal: Pt. accepted/substance abuse rehab Outcome: Not Applicable Date Met:  02/29/12 Pt was councililed by ACT Team Goal: Other Discharge Outcomes/Goals Outcome: Completed/Met Date Met:  02/29/12 Discharge instructions read to pt by Charlena Cross RN Pt verbalized understanding of all IV was removed from rt forearm cath tip intact Discharged to home with friend low fat  Tip given to pt given to pt

## 2012-02-29 NOTE — Discharge Summary (Signed)
Physician Discharge Summary  [name] [name] MRN: [id number] DOB/AGE: 1961/11/13 51 y.o.  PCP: Milinda Antis, MD, MD   Admit date: 02/24/2012 Discharge date: 02/29/2012  Discharge Diagnoses:  1. Recurrent acute pancreatitis secondary to alcohol abuse and hypertriglyceridemia. The patient's lipase was 181 on admission and 55 at the time of discharge. 2. Developing pseudocyst per CT scan of the abdomen and pelvis. 3. Elevated liver transaminases in the setting of hepatic steatosis and likely alcoholic hepatitis. At the time of discharge, his AST was 41, ALT 24, total protein 7.8, and total bilirubin 1.5. 4. Hypertriglyceridemia. The patient's triglyceride level was 504. 5. Hypokalemia, supplemented and repleted. 6. Gastroesophageal reflux disease. 7. Alcohol abuse. 8. Chronic allergic rhinitis. 9. Hyponatremia. His serum sodium was 1:30 prior to discharge.   Medication List  As of 02/29/2012  2:38 PM   STOP taking these medications         BC HEADACHE POWDER PO      fexofenadine 180 MG tablet      oxyCODONE-acetaminophen 5-325 MG per tablet         TAKE these medications         fexofenadine 60 MG tablet   Commonly known as: ALLEGRA   Take 60 mg by mouth daily.      fluticasone 50 MCG/ACT nasal spray   Commonly known as: FLONASE   Place 1 spray into the nose 2 (two) times daily.      gemfibrozil 600 MG tablet   Commonly known as: LOPID   Take 1 tablet (600 mg total) by mouth 2 (two) times daily before a meal. FOR TREATMENT OF YOUR HIGH TRIGLYCERIDE LEVEL.      omeprazole 20 MG capsule   Commonly known as: PRILOSEC   Take 1 capsule (20 mg total) by mouth daily.      oxyCODONE-acetaminophen 10-325 MG per tablet   Commonly known as: PERCOCET   Take 1 tablet by mouth every 4 (four) hours as needed for pain.      promethazine 12.5 MG tablet   Commonly known as: PHENERGAN   Take 1 tablet (12.5 mg total) by mouth every 6 (six) hours as needed.      traZODone 100 MG  tablet   Commonly known as: DESYREL   Take 100 mg by mouth at bedtime. Sleep            Discharge Condition: Improved and stable.  Disposition: 01-Home or Self Care   Consults: None.   Significant Diagnostic Studies: Ct Abdomen W Contrast  02/27/2012  *RADIOLOGY REPORT*  Clinical Data:   Upper abdominal pain.  Pancreatitis.  CT ABDOMEN WITH CONTRAST  Technique:  Multidetector CT imaging of the abdomen was performed using the standard protocol following bolus administration of intravenous contrast.  Contrast: OMNIPAQUE IOHEXOL 300 MG/ML IJ SOLN  Comparison:  12/10/2011.  Findings:  Lung bases demonstrate dependent atelectasis and linear subsegmental atelectasis.  Heart grossly appears normal.  Low attenuation of the liver suggesting hepatic steatosis.  The gallbladder appears normal.  There are no calcified gallstones. Common bile duct is within normal limits. Spleen normal.  Both adrenal glands are normal.  Pancreatic tail inflammatory changes are present with a 26 mm low attenuation fluid collection interposed between the tail of the pancreas and the posterior gastric fundus (image 20 series 2).  This is compatible with a small developing pseudocyst. There is a second developing pseudocyst in the inferior portion of the pancreatic tail measuring 19 mm (image 27 series  2).  Retroperitoneal fat stranding is present system with active pancreatitis.  The head and uncinate process appear normal.  There is no gas in the pancreatic bed to suggest infected pancreatic necrosis.  The portal vein is patent.  Splenic vein and artery appear within normal limits.  No vascular complications of pancreatitis.  Prominent collateral vessels are present in the left upper quadrant.  Visualized small and large bowel appears normal. A tiny portion of the appendix is visualized and appears normal. Aorta normal.  Bones appear within normal limits.  L3-L4 and L4-L5 degenerative disc disease with broad-based posterior  disc bulging. No aggressive osseous lesions.  IMPRESSION: 1.  Recurrent pancreatitis with developing pseudocyst interposed between the pancreatic tail and posterior gastric wall.  Second smaller pseudocyst is present inferior to the pancreatic tail. 2.  Low attenuation of the liver compatible with hepatic steatosis. Constellation of findings suggest alcoholic pancreatitis. 3.  No vascular complications of pancreatitis.  No pancreatic necrosis.  Original Report Authenticated By: Andreas Newport, M.D.   Microbiology: No results found for this or any previous visit (from the past 240 hour(s)).   Labs: Results for orders placed during the hospital encounter of 02/24/12 (from the past 48 hour(s))  COMPREHENSIVE METABOLIC PANEL     Status: Abnormal   Collection Time   02/29/12  7:15 AM      Component Value Range Comment   Sodium 130 (*) 135 - 145 (mEq/L)    Potassium 4.0  3.5 - 5.1 (mEq/L)    Chloride 95 (*) 96 - 112 (mEq/L)    CO2 28  19 - 32 (mEq/L)    Glucose, Bld 135 (*) 70 - 99 (mg/dL)    BUN 4 (*) 6 - 23 (mg/dL)    Creatinine, Ser 1.61  0.50 - 1.35 (mg/dL)    Calcium 9.7  8.4 - 10.5 (mg/dL)    Total Protein 7.8  6.0 - 8.3 (g/dL)    Albumin 2.9 (*) 3.5 - 5.2 (g/dL)    AST 41 (*) 0 - 37 (U/L)    ALT 24  0 - 53 (U/L)    Alkaline Phosphatase 109  39 - 117 (U/L)    Total Bilirubin 1.5 (*) 0.3 - 1.2 (mg/dL)    GFR calc non Af Amer >90  >90 (mL/min)    GFR calc Af Amer >90  >90 (mL/min)   LIPASE, BLOOD     Status: Normal   Collection Time   02/29/12  7:15 AM      Component Value Range Comment   Lipase 55  11 - 59 (U/L)   CBC     Status: Abnormal   Collection Time   02/29/12  7:15 AM      Component Value Range Comment   WBC 12.1 (*) 4.0 - 10.5 (K/uL)    RBC 3.97 (*) 4.22 - 5.81 (MIL/uL)    Hemoglobin 12.5 (*) 13.0 - 17.0 (g/dL)    HCT 09.6 (*) 04.5 - 52.0 (%)    MCV 95.2  78.0 - 100.0 (fL)    MCH 31.5  26.0 - 34.0 (pg)    MCHC 33.1  30.0 - 36.0 (g/dL)    RDW 40.9 (*) 81.1 - 15.5 (%)     Platelets 327  150 - 400 (K/uL)   MAGNESIUM     Status: Abnormal   Collection Time   02/29/12  7:15 AM      Component Value Range Comment   Magnesium 2.7 (*) 1.5 - 2.5 (mg/dL)  HPI : The patient is a 51 year old man with a past medical history significant for alcohol abuse, acute pancreatitis, and hypertriglyceridemia, who presented to the emergency department on 02/24/2012 with a chief complaint of abdominal pain radiating to the back. In the emergency department, he was noted to be afebrile and hemodynamically stable. His lab data were significant for a serum sodium of 132, AST of 270, ALT of 75, alkaline phosphatase of 121, total bilirubin of 1.9, and albumin of 2.8. His lipase was 181. His WBC was within normal limits at 6.4. His urinalysis revealed no leukocytes and no nitrites. He was admitted for further evaluation and management.  HOSPITAL COURSE: The patient was made n.p.o. for bowel rest. IV fluids were started for hydration. IV analgesics were started and given for pain as needed. His lipase and liver transaminases were monitored daily. Vitamin therapy and as needed Ativan were ordered for alcoholism. The clinical social worker was consulted to provide the patient with resources that would help him with sobriety. He voiced a desire to stop drinking. He stated that he would followup with Alcoholics Anonymous and other detox programs in the community that could assist him with stop drinking. His wife was in agreement and stated that she would support him.  His pain subsided and his liver transaminases and lipase levels decreased. A clear liquid diet was started which he tolerated well. Her diet was quickly advanced to solid foods, however, the patient developed worsening pain. His diet was again downgraded to clear liquids. A CT scan of his abdomen and pelvis was ordered for further evaluation. The CT revealed recurrent pancreatitis with developing pseudocyst between the pancreatic tail and  posterior gastric wall and a second smaller pseudocyst in the inferior aspect of the pancreatic tail. The patient was informed of this. He was again encouraged to achieve sobriety because of these changes seen in his pancreas.  Over the course of the hospitalization, the patient's pain subsided but it is not completely resolved. Nevertheless, his lipase level normalized and his liver transaminases improved significantly. His diet was again advanced to a low-fat diet. He tolerated well. Of note, his triglyceride level was assessed and it was a little above 500. He was restarted on gemfibrozil which he had been noncompliant with. He was encouraged to take gemfibrozil as prescribed.  The patient remained hemodynamically stable and afebrile. He demonstrated no signs or symptoms consistent with alcohol withdrawal syndrome. His serum potassium fell slightly, but it was repleted and supplemented and eventually normalized prior to discharge. His serum sodium decreased, but this was thought to be secondary to mild volume depletion when his diet was downgraded again. IV fluids were discontinued after 48 hour of the hospitalization,, but normal saline was restarted prior to discharge and then discontinued. He was discharged to home on as needed Percocet and as needed Phenergan for symptomatic treatment.     Discharge Exam:  Blood pressure 108/74, pulse 93, temperature 98.3 F (36.8 C), temperature source Oral, resp. rate 18, height 5\' 9"  (1.753 m), weight 81.2 kg (179 lb 0.2 oz), SpO2 100.00%.  lungs: Clear to auscultation bilaterally. Heart: S1, S2, with no murmurs rubs or gallops. Abdomen: Positive bowel sounds, soft, mild tenderness in the epigastrium without guarding, distention, or masses palpated. Extremities: No pedal edema. Neurologic/psychological: He is alert and oriented x3. No tremulousness or signs of alcohol withdrawal syndrome.     Discharge Orders    Future Orders Please Complete By Expires    Diet - low sodium heart  healthy      Increase activity slowly      Discharge instructions      Comments:   FOLLOW UP WITH YOUR DOCTOR IN 7-10 DAYS. FOLLOW UP WITH PROGRAMS THAT WILL HELP YOU STOP DRINKING. FOLLOW A LOW FAT DIET.      Follow-up Information    Schedule an appointment as soon as possible for a visit with Milinda Antis, MD. (IN 7 - 10 DAYS)    Contact information:   [address], [address] [place] [address] [phone]          Discharge time: 40 minutes.   Signed: Larayne Baxley 02/29/2012, 2:38 PM

## 2012-03-06 ENCOUNTER — Encounter: Payer: Self-pay | Admitting: Family Medicine

## 2012-03-06 ENCOUNTER — Ambulatory Visit (INDEPENDENT_AMBULATORY_CARE_PROVIDER_SITE_OTHER): Payer: Medicare Other | Admitting: Family Medicine

## 2012-03-06 VITALS — BP 130/84 | HR 90 | Resp 18 | Ht 69.5 in | Wt 189.1 lb

## 2012-03-06 DIAGNOSIS — J309 Allergic rhinitis, unspecified: Secondary | ICD-10-CM

## 2012-03-06 DIAGNOSIS — F1010 Alcohol abuse, uncomplicated: Secondary | ICD-10-CM

## 2012-03-06 DIAGNOSIS — K862 Cyst of pancreas: Secondary | ICD-10-CM

## 2012-03-06 DIAGNOSIS — N529 Male erectile dysfunction, unspecified: Secondary | ICD-10-CM

## 2012-03-06 DIAGNOSIS — K7689 Other specified diseases of liver: Secondary | ICD-10-CM

## 2012-03-06 DIAGNOSIS — K861 Other chronic pancreatitis: Secondary | ICD-10-CM | POA: Insufficient documentation

## 2012-03-06 DIAGNOSIS — R51 Headache: Secondary | ICD-10-CM

## 2012-03-06 DIAGNOSIS — K7200 Acute and subacute hepatic failure without coma: Secondary | ICD-10-CM

## 2012-03-06 DIAGNOSIS — K760 Fatty (change of) liver, not elsewhere classified: Secondary | ICD-10-CM | POA: Insufficient documentation

## 2012-03-06 DIAGNOSIS — K863 Pseudocyst of pancreas: Secondary | ICD-10-CM

## 2012-03-06 MED ORDER — KETOROLAC TROMETHAMINE 60 MG/2ML IJ SOLN
60.0000 mg | Freq: Once | INTRAMUSCULAR | Status: AC
  Administered 2012-03-06: 60 mg via INTRAMUSCULAR

## 2012-03-06 MED ORDER — FEXOFENADINE HCL 180 MG PO TABS: 180.0000 mg | ORAL_TABLET | Freq: Every day | ORAL | Status: DC

## 2012-03-06 MED ORDER — KETOROLAC TROMETHAMINE 60 MG/2ML IJ SOLN: 60.0000 mg | Freq: Once | INTRAMUSCULAR | Status: DC

## 2012-03-06 NOTE — Patient Instructions (Signed)
I have given you a shot for your migraine Do not drink alcohol, avoid fried foods, fatty foods Do not take any over the counter medications such as BC powder, goody powder, tyelenol, aleve If you want to start a program for the drinking please let me know Use the flonase and allergy medication for your sinuses F/U in 3 months

## 2012-03-06 NOTE — Assessment & Plan Note (Addendum)
Resolving secondary to alcohol abuse and hepatitic steatosis, will continue to monitor

## 2012-03-06 NOTE — Assessment & Plan Note (Signed)
Pt denies any recent alcohol use. He states he will start going to [employer], I did discuss residential programs and he declined, including [facility] and [facility] alcohol abuse program

## 2012-03-06 NOTE — Assessment & Plan Note (Signed)
No surgical intervention, hopefully this will resolve if he refrains from alcohol use

## 2012-03-06 NOTE — Assessment & Plan Note (Signed)
Acute episode now resolved with LFT trending down, pancreatitis secondary to hypertriglyceridemia and alcohol use Stressed importance of adherence to diet, cessation of alcohol and only prescribed medications

## 2012-03-06 NOTE — Assessment & Plan Note (Signed)
On lopid, TG were 500

## 2012-03-06 NOTE — Assessment & Plan Note (Signed)
Continue flonase, script sent for allegra as pt was still taking OTC meds

## 2012-03-06 NOTE — Progress Notes (Signed)
  Subjective:    Patient ID: [name] [name], male    DOB: [birth year], 51 y.o.   MRN: [id number]  HPI Patient is here to followup hospital admission for chronic pancreatitis with acute flare as well as alcoholic hepatitis. He continues to have some abdominal pain but this is much improved. His pain is mostly in the right upper caution epigastrium and in his back. He is currently on Percocet given by the hospital as well as hydrocodone given previously by his orthopedic surgeon. He is watching the foods he takes is avoiding fatty foods and spicy foods. He is also taking his Lopid and omeprazole as prescribed. He thinks he supposed to be on Lipitor however I do not see where this was prescribed to him.  Sinus drainage/headache-he continues to have drainage from his nose he has been treated multiple times for sinusitis. He states he's had migraines because of his sinus pressure however did not have these previously. He is using the Flonase as well as sinus relief over-the-counter medications which she was advised to stop at a previous visit. He denies any fever. No change in vision.  Sexual problem- at the end of the visit he states he has difficulty maintaining an erection and with ejaculation. He is scheduled to have a circumcision done by urology in 2 weeks. He is concerned as one of his medications although he does not have his psychiatric medications with him and we do not know what these are.   Review of Systems  GEN- denies fatigue, fever, weight loss,weakness, recent illness HEENT- denies eye drainage, change in vision, nasal discharge, CVS- denies chest pain, palpitations RESP- denies SOB, cough, wheeze ABD- denies N/V, change in stools,+ abd pain GU- denies dysuria, hematuria, dribbling, incontinence MSK- + joint pain, muscle aches, injury Neuro- + headache, dizziness, syncope, seizure activity       Objective:   Physical Exam   GEN- NAD, alert and oriented x3, HEENT- PERRL, EOMI, non  icteric non injected sclera, pink conjunctiva, MMM, oropharynx clear, TM clear with dryness in ears- peeling skin at entrance to canal ,fundoscopic exam benign CVS- RRR, no murmur RESP-CTAB ABD-NABS,soft,mild TTP upper quandrants no gaurding,no rebound, no distension, no masses felt  EXT- No edema, right knee with brace Pulses- Radial, DP- 2+      Assessment & Plan:

## 2012-03-06 NOTE — Assessment & Plan Note (Signed)
This is likley multifactorial with his medications ( psychiatric meds unknown), alcohol abuse and chronic illness. He is to see URology for circumcision will have him f/u with him regarding this

## 2012-03-08 ENCOUNTER — Other Ambulatory Visit: Payer: Self-pay | Admitting: Family Medicine

## 2012-03-11 ENCOUNTER — Encounter (HOSPITAL_COMMUNITY): Payer: Self-pay

## 2012-03-11 ENCOUNTER — Encounter (HOSPITAL_COMMUNITY)
Admission: RE | Admit: 2012-03-11 | Discharge: 2012-03-11 | Disposition: A | Discharge status: Home / Self Care | Payer: Medicare Other | Source: Ambulatory Visit | Attending: Urology | Admitting: Urology

## 2012-03-11 HISTORY — DX: Unspecified osteoarthritis, unspecified site: M19.90

## 2012-03-11 LAB — BASIC METABOLIC PANEL
BUN: 9 mg/dL (ref 6–23)
CO2: 28 mEq/L (ref 19–32)
Calcium: 9.4 mg/dL (ref 8.4–10.5)
Chloride: 104 mEq/L (ref 96–112)
Creatinine, Ser: 0.95 mg/dL (ref 0.50–1.35)
GFR calc Af Amer: >90 mL/min (ref >90)
GFR calc non Af Amer: >90 mL/min (ref >90)
Glucose, Bld: 94 mg/dL (ref 70–99)
Potassium: 4 mEq/L (ref 3.5–5.1)
Sodium: 138 mEq/L (ref 135–145)

## 2012-03-11 LAB — SURGICAL PCR SCREEN
MRSA, PCR: NEGATIVE
Staphylococcus aureus: NEGATIVE

## 2012-03-11 LAB — HEMOGLOBIN AND HEMATOCRIT, BLOOD
HCT: 36.3 % — ABNORMAL LOW (ref 39.0–52.0)
Hemoglobin: 11.7 g/dL — ABNORMAL LOW (ref 13.0–17.0)

## 2012-03-11 NOTE — Patient Instructions (Signed)
Male Circumcision Male circumcision is a surgery to remove the foreskin of the penis or to cut the foreskin so the opening is larger. This is usually an elective procedure. Elective means the surgery is done when it is the best time for you. It may be done more urgently for medical reasons. One reason to have surgery right away is when the foreskin is so tight that it cannot be retracted. This is called phimosis. Other reasons include infection of the area under the foreskin or head of the penis (glans). When only the foreskin is cut, it is called a dorsal (on top of the foreskin) incision. This procedure leaves the entire foreskin but makes the end of the foreskin looser so it can be pulled back over the head of the penis.  BEFORE THE PROCEDURE   Do not take aspirin or blood thinners for a week prior to surgery, or as your caregiver suggests.   Do not eat or drink anything after midnight the night before surgery, or as instructed.   Let your caregiver know if you develop a cold or other infection before surgery.   You should be present 1 hour before the procedure, or as directed.   Before the procedure, you will be washed and given a local anesthetic to make sure you feel no pain.  LET YOUR CAREGIVERS KNOW ABOUT:  Allergies.   Medications taken including herbs, eye drops, over the counter medications and creams.   Use of steroids (by mouth or creams).   Previous problems with anesthetics or Novocaine.   History of blood clots (thrombophlebitis).   History of bleeding or blood problems.   Previous surgery.   Other health problems.  RISKS AND COMPLICATIONS  The most common complications include:  Bleeding.   Infection.   Pain.   Urethral injury. The urethra is the opening on the end of the penis that carries your urine out.   Breaking open of the surgical wound from an unwanted erection after surgery can occur.  HOME CARE INSTRUCTIONS   After your circumcision, you may have  some pain or discomfort for several days. If pain medications were given by your caregiver, take them as instructed.   Do not take aspirin or nonsteroidal anti-inflammatory medications such as Advil and Motrin as these can increase the risk of bleeding.   Your caregiver may or may not have put a bandage on your penis. This bandage should stay on for 48 hours (2 days) or as instructed. Usually, the bandage falls off after days. If it does not, you can soak it off with warm, soapy water. No bandage is needed following this unless your caregiver tells you otherwise.   Do not get your wound wet for 2 days or as instructed.   After 2 days, you may take a shower or sponge bath. Clean your penis with warm soapy water. After you shower, gently pat your incision dry with a towel. Do not rub it. Clean around the foreskin regularly. For comfort, you may take warm water sitz baths 2-3 times daily for 20 minutes to soothe the affected area.   Antibiotics, anti-fungal, or cortisone creams or ointments may be used as directed.   All sexual activity should be avoided until your caregiver says it is OK.   Your caregiver may provide you with an ampule of amyl nitrate. This can be inhaled (breathed in) as directed to stop an erection that occurs during the recovery period. This can damage the surgical incision.  Serious infections may require medications which kill germs (antibiotics).   Take time off work or school as directed by your caregiver.  If you do heavy lifting or if your job keeps you seated and you are not able to move around freely for long periods, a week off may be necessary.   PATIENT INSTRUCTIONS POST-ANESTHESIA  IMMEDIATELY FOLLOWING SURGERY:  Do not drive or operate machinery for the first twenty four hours after surgery.  Do not make any important decisions for twenty four hours after surgery or while taking narcotic pain medications or sedatives.  If you develop intractable nausea and  vomiting or a severe headache please notify your doctor immediately.  FOLLOW-UP:  Please make an appointment with your surgeon as instructed. You do not need to follow up with anesthesia unless specifically instructed to do so.  WOUND CARE INSTRUCTIONS (if applicable):  Keep a dry clean dressing on the anesthesia/puncture wound site if there is drainage.  Once the wound has quit draining you may leave it open to air.  Generally you should leave the bandage intact for twenty four hours unless there is drainage.  If the epidural site drains for more than 36-48 hours please call the anesthesia department.  QUESTIONS?:  Please feel free to call your physician or the hospital operator if you have any questions, and they will be happy to assist you.     [facility] Anesthesia Department [address] [place] [phone]    [address]  03/11/2012   Your procedure is scheduled on:  Tuesday, 03/17/12  Report to [facility] at 5732 AM.  Call this number if you have problems the morning of surgery: [phone]   Remember:   Do not eat food:After Midnight.  May have clear liquids:until Midnight .  Clear liquids include soda, tea, black coffee, apple or grape juice, broth.  Take these medicines the morning of surgery with A SIP OF WATER: omeprazole, percocet, and celexa   Do not wear jewelry, make-up or nail polish.  Do not wear lotions, powders, or perfumes. You may wear deodorant.  Do not shave 48 hours prior to surgery.  Do not bring valuables to the hospital.  Contacts, dentures or bridgework may not be worn into surgery.  Leave suitcase in the car. After surgery it may be brought to your room.  For patients admitted to the hospital, checkout time is 11:00 AM the day of discharge.   Patients discharged the day of surgery will not be allowed to drive home.  Name and phone number of your driver: driver  Special Instructions: CHG Shower Use Special Wash: 1/2 bottle night before  surgery and 1/2 bottle morning of surgery.    Please read over the following fact sheets that you were given: Pain Booklet, MRSA Information, Surgical Site Infection Prevention, Anesthesia Post-op Instructions and Care and Recovery After Surgery     Avoid fast-moving or contact sports, cycling and swimming until your circumcision has fully healed. This may take 4-6 weeks.   Call your caregiver for problems which are not getting better in 2-3 days or which seem to be getting worse.  SEEK MEDICAL CARE IF YOU:  Have a fever of 102 F (38.9 C) or more.   Have redness or swelling around your wound.   Have yellow or green drainage coming from your wound.   Have any bleeding that does not stop when you put mild pressure on it.  SEEK IMMEDIATE MEDICAL CARE IF:  You are unable to urinate.   You have pain when passing urine.   You have increasing pain not controlled with medications.  Document Released: 12/29/2007 Document Revised: 11/28/2011 Document Reviewed: 12/29/2007 [facility] Patient Information 2012 [place], [place].

## 2012-03-16 ENCOUNTER — Emergency Department (HOSPITAL_COMMUNITY)
Admission: EM | Admit: 2012-03-16 | Discharge: 2012-03-16 | Disposition: A | Discharge status: Home / Self Care | Payer: Medicare Other | Attending: Emergency Medicine | Admitting: Emergency Medicine

## 2012-03-16 ENCOUNTER — Encounter (HOSPITAL_COMMUNITY): Payer: Self-pay

## 2012-03-16 DIAGNOSIS — F17200 Nicotine dependence, unspecified, uncomplicated: Secondary | ICD-10-CM | POA: Insufficient documentation

## 2012-03-16 DIAGNOSIS — M109 Gout, unspecified: Secondary | ICD-10-CM | POA: Insufficient documentation

## 2012-03-16 DIAGNOSIS — K219 Gastro-esophageal reflux disease without esophagitis: Secondary | ICD-10-CM | POA: Insufficient documentation

## 2012-03-16 DIAGNOSIS — Z79899 Other long term (current) drug therapy: Secondary | ICD-10-CM | POA: Insufficient documentation

## 2012-03-16 DIAGNOSIS — M549 Dorsalgia, unspecified: Secondary | ICD-10-CM

## 2012-03-16 DIAGNOSIS — F319 Bipolar disorder, unspecified: Secondary | ICD-10-CM | POA: Insufficient documentation

## 2012-03-16 DIAGNOSIS — M545 Low back pain, unspecified: Secondary | ICD-10-CM | POA: Insufficient documentation

## 2012-03-16 DIAGNOSIS — E785 Hyperlipidemia, unspecified: Secondary | ICD-10-CM | POA: Insufficient documentation

## 2012-03-16 DIAGNOSIS — G8929 Other chronic pain: Secondary | ICD-10-CM | POA: Insufficient documentation

## 2012-03-16 MED ORDER — CYCLOBENZAPRINE HCL 10 MG PO TABS
10.0000 mg | ORAL_TABLET | Freq: Once | ORAL | Status: AC
  Administered 2012-03-16: 10 mg via ORAL
  Filled 2012-03-16: qty 1

## 2012-03-16 MED ORDER — HYDROMORPHONE HCL PF 1 MG/ML IJ SOLN
1.0000 mg | Freq: Once | INTRAMUSCULAR | Status: AC
  Administered 2012-03-16: 1 mg via INTRAMUSCULAR
  Filled 2012-03-16: qty 1

## 2012-03-16 MED ORDER — HYDROCODONE-ACETAMINOPHEN 7.5-500 MG/15ML PO SOLN: 7.5000 mL | Freq: Four times a day (QID) | ORAL | Status: AC | PRN

## 2012-03-16 MED ORDER — IBUPROFEN 800 MG PO TABS
800.0000 mg | ORAL_TABLET | Freq: Once | ORAL | Status: AC
  Administered 2012-03-16: 800 mg via ORAL
  Filled 2012-03-16: qty 1

## 2012-03-16 MED ORDER — IBUPROFEN 800 MG PO TABS: 800.0000 mg | ORAL_TABLET | Freq: Three times a day (TID) | ORAL | Status: DC

## 2012-03-16 MED ORDER — CYCLOBENZAPRINE HCL 10 MG PO TABS: 10.0000 mg | ORAL_TABLET | Freq: Two times a day (BID) | ORAL | Status: DC | PRN

## 2012-03-16 NOTE — ED Notes (Signed)
Having back and left hip pain per pt. I have arthritis per pt. Right foot is sore also per pt.

## 2012-03-16 NOTE — ED Provider Notes (Signed)
History     CSN: [id number]  Arrival date & time 03/16/12  3244   First MD Initiated Contact with Patient 03/16/12 [phone]      Chief Complaint  Patient presents with  . Back Pain  . Hip Pain    (Consider location/radiation/quality/duration/timing/severity/associated sxs/prior treatment) Patient is a 51 y.o. male presenting with back pain. The history is provided by the patient.  Back Pain  This is a chronic problem. The problem occurs constantly. The problem has not changed since onset.The pain is associated with no known injury. The pain is present in the lumbar spine. The quality of the pain is described as shooting. Radiates to: left hip. The pain is moderate. The symptoms are aggravated by bending, twisting and certain positions. The pain is the same all the time. Pertinent negatives include no chest pain, no fever, no numbness, no weight loss, no headaches, no abdominal pain, no bowel incontinence, no perianal numbness, no bladder incontinence, no dysuria, no paresthesias, no paresis, no tingling and no weakness. He has tried nothing for the symptoms.  pain is moderate to severe. Patient here tonight because he ran out of his medications at home. He is followed by orthopedics as well as primary care for the symptoms and states that Vicodin typically helps with the symptoms. No recent lifting and no new trauma. No new symptoms. Pain feels like it is chronic persistent pain.  Past Medical History  Diagnosis Date  . Bronchitis   . Acid reflux   . Pancreatitis   . Gout   . Bipolar disorder   . Hyperlipidemia   . Alcohol abuse   . Pseudocyst of pancreas 02/28/2012  . Arthritis     Past Surgical History  Procedure Date  . Nasal suregery     broken nose    Family History  Problem Relation Age of Onset  . Diabetes Father   . Anesthesia problems Neg Hx   . Hypotension Neg Hx   . Malignant hyperthermia Neg Hx   . Pseudochol deficiency Neg Hx     History  Substance Use Topics    . Smoking status: Current Everyday Smoker -- 0.5 packs/day for 25 years    Types: Cigarettes  . Smokeless tobacco: Not on file  . Alcohol Use: 29.4 oz/week    35 Cans of beer, 14 Shots of liquor per week     Daily      Review of Systems  Constitutional: Negative for fever, chills and weight loss.  HENT: Negative for neck pain and neck stiffness.   Eyes: Negative for pain.  Respiratory: Negative for shortness of breath.   Cardiovascular: Negative for chest pain and leg swelling.  Gastrointestinal: Negative for abdominal pain and bowel incontinence.  Genitourinary: Negative for bladder incontinence and dysuria.  Musculoskeletal: Positive for back pain.  Skin: Negative for rash.  Neurological: Negative for tingling, weakness, numbness, headaches and paresthesias.  All other systems reviewed and are negative.    Allergies  Penicillins  Home Medications   Current Outpatient Rx  Name Route Sig Dispense Refill  . CITALOPRAM HYDROBROMIDE 20 MG PO TABS Oral Take 1 tablet (20 mg total) by mouth daily. 30 tablet 2  . FEXOFENADINE HCL 180 MG PO TABS Oral Take 1 tablet (180 mg total) by mouth daily. 30 tablet 2  . FLUTICASONE PROPIONATE 50 MCG/ACT NA SUSP Nasal Place 1 spray into the nose 2 (two) times daily. 16 g 2  . GEMFIBROZIL 600 MG PO TABS Oral Take 1 tablet (  600 mg total) by mouth 2 (two) times daily before a meal. FOR TREATMENT OF YOUR HIGH TRIGLYCERIDE LEVEL. 60 tablet 3  . OMEPRAZOLE 20 MG PO CPDR Oral Take 1 capsule (20 mg total) by mouth daily. 30 capsule 1  . TRAZODONE HCL 100 MG PO TABS Oral Take 100 mg by mouth at bedtime. Sleep    . HYDROCODONE-ACETAMINOPHEN 5-500 MG PO TABS Oral Take 1 tablet by mouth every 6 (six) hours as needed. Given by Dr. Ophelia Charter      BP 126/69  Pulse 99  Temp(Src) 98.7 F (37.1 C) (Oral)  SpO2 96%  Physical Exam  Constitutional: He is oriented to person, place, and time. He appears well-developed and well-nourished.  HENT:  Head:  Normocephalic and atraumatic.  Eyes: Conjunctivae and EOM are normal. Pupils are equal, round, and reactive to light.  Neck: Trachea normal. Neck supple. No thyromegaly present.  Cardiovascular: Normal rate, regular rhythm, S1 normal, S2 normal and normal pulses.     No systolic murmur is present   No diastolic murmur is present  Pulses:      Radial pulses are 2+ on the right side, and 2+ on the left side.  Pulmonary/Chest: Effort normal and breath sounds normal. He has no wheezes. He has no rhonchi. He has no rales. He exhibits no tenderness.  Abdominal: Soft. Normal appearance and bowel sounds are normal. There is no tenderness. There is no CVA tenderness and negative Murphy's sign.  Musculoskeletal:       Left lower paralumbar reproducible tenderness. No midline tenderness or deformity. No lower extremity deficits with equal and intact strengths, sensorium to light touch and DTRs.  Neurological: He is alert and oriented to person, place, and time. He has normal strength and normal reflexes. He displays normal reflexes. No cranial nerve deficit or sensory deficit. He exhibits normal muscle tone. Coordination normal. GCS eye subscore is 4. GCS verbal subscore is 5. GCS motor subscore is 6.       Gait normal and intact  Skin: Skin is warm and dry. No rash noted. He is not diaphoretic.  Psychiatric: His speech is normal.       Cooperative and appropriate    ED Course  Procedures (including critical care time)  Motrin. Ice. Flexeril. Intramuscular Dilaudid.   MDM   Acute exacerbation of chronic low back pain.  Improved with medications as above. Plan primary care followup. Prescriptions provided. Patient states understanding back pain precautions and agrees to discharge and followup instructions. No indication for emergent imaging at this time, is stable for discharge home.        Sunnie Nielsen, MD 03/16/12 [phone]

## 2012-03-16 NOTE — Discharge Instructions (Signed)
Back Pain, Adult Low back pain is very common. About 1 in 5 people have back pain.The cause of low back pain is rarely dangerous. The pain often gets better over time.About half of people with a sudden onset of back pain feel better in just 2 weeks. About 8 in 10 people feel better by 6 weeks.  CAUSES Some common causes of back pain include:  Strain of the muscles or ligaments supporting the spine.   Wear and tear (degeneration) of the spinal discs.   Arthritis.   Direct injury to the back.  DIAGNOSIS Most of the time, the direct cause of low back pain is not known.However, back pain can be treated effectively even when the exact cause of the pain is unknown.Answering your caregiver's questions about your overall health and symptoms is one of the most accurate ways to make sure the cause of your pain is not dangerous. If your caregiver needs more information, he or she may order lab work or imaging tests (X-rays or MRIs).However, even if imaging tests show changes in your back, this usually does not require surgery. HOME CARE INSTRUCTIONS For many people, back pain returns.Since low back pain is rarely dangerous, it is often a condition that people can learn to manageon their own.   Remain active. It is stressful on the back to sit or stand in one place. Do not sit, drive, or stand in one place for more than 30 minutes at a time. Take short walks on level surfaces as soon as pain allows.Try to increase the length of time you walk each day.   Do not stay in bed.Resting more than 1 or 2 days can delay your recovery.   Do not avoid exercise or work.Your body is made to move.It is not dangerous to be active, even though your back may hurt.Your back will likely heal faster if you return to being active before your pain is gone.   Pay attention to your body when you bend and lift. Many people have less discomfortwhen lifting if they bend their knees, keep the load close to their  bodies,and avoid twisting. Often, the most comfortable positions are those that put less stress on your recovering back.   Find a comfortable position to sleep. Use a firm mattress and lie on your side with your knees slightly bent. If you lie on your back, put a pillow under your knees.   Only take over-the-counter or prescription medicines as directed by your caregiver. Over-the-counter medicines to reduce pain and inflammation are often the most helpful.Your caregiver may prescribe muscle relaxant drugs.These medicines help dull your pain so you can more quickly return to your normal activities and healthy exercise.   Put ice on the injured area.   Put ice in a plastic bag.   Place a towel between your skin and the bag.   Leave the ice on for 15 to 20 minutes, 3 to 4 times a day for the first 2 to 3 days. After that, ice and heat may be alternated to reduce pain and spasms.   Ask your caregiver about trying back exercises and gentle massage. This may be of some benefit.   Avoid feeling anxious or stressed.Stress increases muscle tension and can worsen back pain.It is important to recognize when you are anxious or stressed and learn ways to manage it.Exercise is a great option.  SEEK MEDICAL CARE IF:  You have pain that is not relieved with rest or medicine.   You have   pain that does not improve in 1 week.   You have new symptoms.   You are generally not feeling well.  SEEK IMMEDIATE MEDICAL CARE IF:   You have pain that radiates from your back into your legs.   You develop new bowel or bladder control problems.   You have unusual weakness or numbness in your arms or legs.   You develop nausea or vomiting.   You develop abdominal pain.   You feel faint.  Document Released: 12/09/2005 Document Revised: 11/28/2011 Document Reviewed: 04/29/2011 [employer] Patient Information 2012 [employer]. 

## 2012-03-17 ENCOUNTER — Encounter (HOSPITAL_COMMUNITY)
Admission: RE | Disposition: A | Discharge status: Home / Self Care | Payer: Self-pay | Source: Ambulatory Visit | Attending: Urology

## 2012-03-17 ENCOUNTER — Ambulatory Visit (HOSPITAL_COMMUNITY): Payer: Medicare Other | Admitting: Anesthesiology

## 2012-03-17 ENCOUNTER — Encounter (HOSPITAL_COMMUNITY): Payer: Self-pay | Admitting: *Deleted

## 2012-03-17 ENCOUNTER — Encounter (HOSPITAL_COMMUNITY): Payer: Self-pay | Admitting: Anesthesiology

## 2012-03-17 ENCOUNTER — Ambulatory Visit (HOSPITAL_COMMUNITY)
Admission: RE | Admit: 2012-03-17 | Discharge: 2012-03-17 | Disposition: A | Discharge status: Home / Self Care | Payer: Medicare Other | Source: Ambulatory Visit | Attending: Urology | Admitting: Urology

## 2012-03-17 DIAGNOSIS — N478 Other disorders of prepuce: Secondary | ICD-10-CM | POA: Insufficient documentation

## 2012-03-17 DIAGNOSIS — J4489 Other specified chronic obstructive pulmonary disease: Secondary | ICD-10-CM | POA: Insufficient documentation

## 2012-03-17 DIAGNOSIS — J449 Chronic obstructive pulmonary disease, unspecified: Secondary | ICD-10-CM | POA: Insufficient documentation

## 2012-03-17 DIAGNOSIS — N471 Phimosis: Secondary | ICD-10-CM | POA: Insufficient documentation

## 2012-03-17 HISTORY — PX: CIRCUMCISION: SHX1350

## 2012-03-17 SURGERY — CIRCUMCISION, ADULT
Anesthesia: Spinal | Site: Penis | Wound class: Clean

## 2012-03-17 MED ORDER — PROPOFOL 10 MG/ML IV EMUL
INTRAVENOUS | Status: AC
  Filled 2012-03-17: qty 20

## 2012-03-17 MED ORDER — OXYCODONE-ACETAMINOPHEN 7.5-325 MG PO TABS: 1.0000 | ORAL_TABLET | Freq: Four times a day (QID) | ORAL | Status: DC | PRN

## 2012-03-17 MED ORDER — ONDANSETRON HCL 4 MG/2ML IJ SOLN: 4.0000 mg | Freq: Once | INTRAMUSCULAR | Status: DC | PRN

## 2012-03-17 MED ORDER — BUPIVACAINE IN DEXTROSE 0.75-8.25 % IT SOLN
INTRATHECAL | Status: DC | PRN
  Administered 2012-03-17: 12 mg via INTRATHECAL

## 2012-03-17 MED ORDER — FENTANYL CITRATE 0.05 MG/ML IJ SOLN
INTRAMUSCULAR | Status: DC | PRN
  Administered 2012-03-17: 25 ug via INTRATHECAL

## 2012-03-17 MED ORDER — SODIUM CHLORIDE 0.9 % IR SOLN
Status: DC | PRN
  Administered 2012-03-17: 1000 mL

## 2012-03-17 MED ORDER — LIDOCAINE HCL (PF) 1 % IJ SOLN
INTRAMUSCULAR | Status: AC
  Filled 2012-03-17: qty 5

## 2012-03-17 MED ORDER — MIDAZOLAM HCL 2 MG/2ML IJ SOLN
1.0000 mg | INTRAMUSCULAR | Status: DC | PRN
  Administered 2012-03-17 (×2): 2 mg via INTRAVENOUS

## 2012-03-17 MED ORDER — LACTATED RINGERS IV SOLN
INTRAVENOUS | Status: DC
  Administered 2012-03-17: 1000 mL via INTRAVENOUS
  Administered 2012-03-17: 10:00:00 via INTRAVENOUS

## 2012-03-17 MED ORDER — MIDAZOLAM HCL 2 MG/2ML IJ SOLN
INTRAMUSCULAR | Status: AC
  Administered 2012-03-17: 2 mg via INTRAVENOUS
  Filled 2012-03-17: qty 2

## 2012-03-17 MED ORDER — FENTANYL CITRATE 0.05 MG/ML IJ SOLN
INTRAMUSCULAR | Status: DC | PRN
  Administered 2012-03-17: 25 ug via INTRAVENOUS

## 2012-03-17 MED ORDER — FENTANYL CITRATE 0.05 MG/ML IJ SOLN: 25.0000 ug | INTRAMUSCULAR | Status: DC | PRN

## 2012-03-17 MED ORDER — PROPOFOL 10 MG/ML IV EMUL
INTRAVENOUS | Status: DC | PRN
  Administered 2012-03-17: 40 ug/kg/min via INTRAVENOUS

## 2012-03-17 MED ORDER — MIDAZOLAM HCL 5 MG/5ML IJ SOLN
INTRAMUSCULAR | Status: DC | PRN
  Administered 2012-03-17 (×2): 1 mg via INTRAVENOUS

## 2012-03-17 MED ORDER — MIDAZOLAM HCL 2 MG/2ML IJ SOLN
INTRAMUSCULAR | Status: AC
  Filled 2012-03-17: qty 2

## 2012-03-17 MED ORDER — BUPIVACAINE HCL (PF) 0.5 % IJ SOLN
INTRAMUSCULAR | Status: DC | PRN
  Administered 2012-03-17: 10 mL

## 2012-03-17 MED ORDER — BUPIVACAINE IN DEXTROSE 0.75-8.25 % IT SOLN
INTRATHECAL | Status: AC
  Filled 2012-03-17: qty 2

## 2012-03-17 MED ORDER — FENTANYL CITRATE 0.05 MG/ML IJ SOLN
INTRAMUSCULAR | Status: AC
  Filled 2012-03-17: qty 2

## 2012-03-17 MED ORDER — BUPIVACAINE HCL (PF) 0.5 % IJ SOLN
INTRAMUSCULAR | Status: AC
  Filled 2012-03-17: qty 30

## 2012-03-17 SURGICAL SUPPLY — 28 items
BANDAGE CONFORM 2  STR LF (GAUZE/BANDAGES/DRESSINGS) ×2 IMPLANT
BLADE SURG ROTATE 9660 (MISCELLANEOUS) ×1 IMPLANT
CLOTH BEACON ORANGE TIMEOUT ST (SAFETY) ×2 IMPLANT
COVER SURGICAL LIGHT HANDLE (MISCELLANEOUS) ×4 IMPLANT
DECANTER SPIKE VIAL GLASS SM (MISCELLANEOUS) ×2 IMPLANT
ELECT NDL TIP 2.8 STRL (NEEDLE) IMPLANT
ELECT NEEDLE TIP 2.8 STRL (NEEDLE) IMPLANT
FORMALIN 10 PREFIL 120ML (MISCELLANEOUS) ×2 IMPLANT
GAUZE PETROLATUM 1 X8 (GAUZE/BANDAGES/DRESSINGS) ×2 IMPLANT
GLOVE BIO SURGEON STRL SZ7 (GLOVE) ×4 IMPLANT
GLOVE ECLIPSE 6.5 STRL STRAW (GLOVE) ×1 IMPLANT
GLOVE ECLIPSE 7.0 STRL STRAW (GLOVE) ×1 IMPLANT
GLOVE EXAM NITRILE LRG STRL (GLOVE) IMPLANT
GLOVE INDICATOR 7.0 STRL GRN (GLOVE) ×1 IMPLANT
GOWN STRL REIN XL XLG (GOWN DISPOSABLE) ×4 IMPLANT
KIT ROOM TURNOVER AP CYSTO (KITS) ×2 IMPLANT
MANIFOLD NEPTUNE II (INSTRUMENTS) ×2 IMPLANT
NDL HYPO 25X1 1.5 SAFETY (NEEDLE) ×1 IMPLANT
NEEDLE HYPO 25X1 1.5 SAFETY (NEEDLE) ×2 IMPLANT
NS IRRIG 1000ML POUR BTL (IV SOLUTION) ×2 IMPLANT
PACK MINOR (CUSTOM PROCEDURE TRAY) ×2 IMPLANT
PAD ARMBOARD 7.5X6 YLW CONV (MISCELLANEOUS) ×2 IMPLANT
SET BASIN LINEN APH (SET/KITS/TRAYS/PACK) ×2 IMPLANT
SOL PREP PROV IODINE SCRUB 4OZ (MISCELLANEOUS) ×2 IMPLANT
SUT CHROMIC 3 0 SH 27 (SUTURE) ×4 IMPLANT
SUT VICRYL AB 3 0 TIES (SUTURE) ×2 IMPLANT
SYR CONTROL 10ML LL (SYRINGE) ×2 IMPLANT
TOWEL OR 17X26 4PK STRL BLUE (TOWEL DISPOSABLE) ×1 IMPLANT

## 2012-03-17 NOTE — Addendum Note (Signed)
Addendum  created 03/17/12 1112 by Franco Nones, CRNA   Modules edited:Charges VN

## 2012-03-17 NOTE — Brief Op Note (Signed)
03/17/2012  10:29 AM  PATIENT:  Franne Grip  51 y.o. male  PRE-OPERATIVE DIAGNOSIS:  phimosis  POST-OPERATIVE DIAGNOSIS:  phimosis  PROCEDURE:  Procedure(s) (LRB): CIRCUMCISION ADULT (N/A)  SURGEON:  Surgeon(s) and Role:    * Ky Barban, MD - Primary  PHYSICIAN ASSISTANT:   ASSISTANTS: none   ANESTHESIA:   general  EBL:  Total I/O In: 1000 [I.V.:1000] Out: -   BLOOD ADMINISTERED:none  DRAINS: none   LOCAL MEDICATIONS USED:  MARCAINE     SPECIMEN:  No Specimen and Source of Specimen:  penile skin  DISPOSITION OF SPECIMEN:  PATHOLOGY  COUNTS:  YES  TOURNIQUET:  * No tourniquets in log *  DICTATION: .Other Dictation: Dictation Number dictation [id number]  PLAN OF CARE: Discharge to home after PACU  PATIENT DISPOSITION:  PACU - hemodynamically stable.   Delay start of Pharmacological VTE agent (>24hrs) due to surgical blood loss or risk of bleeding:

## 2012-03-17 NOTE — Anesthesia Preprocedure Evaluation (Signed)
Anesthesia Evaluation  Patient identified by MRN, date of birth, ID band Patient awake    Reviewed: Allergy & Precautions, H&P , NPO status , Patient's Chart, lab work & pertinent test results  Airway Mallampati: II  Neck ROM: Full    Dental  (+) Poor Dentition, Missing, Loose, Chipped and Dental Advisory Given   Pulmonary COPDCurrent Smoker,    + decreased breath sounds      Cardiovascular Rhythm:Regular Rate:Normal     Neuro/Psych PSYCHIATRIC DISORDERS Bipolar Disorder    GI/Hepatic GERD-  Medicated and Controlled,(+)     substance abuse (PANCREATITIS)  alcohol use,   Endo/Other    Renal/GU      Musculoskeletal   Abdominal   Peds  Hematology   Anesthesia Other Findings   Reproductive/Obstetrics                           Anesthesia Physical Anesthesia Plan  ASA: III  Anesthesia Plan: Spinal   Post-op Pain Management:    Induction:   Airway Management Planned: Nasal Cannula  Additional Equipment:   Intra-op Plan:   Post-operative Plan:   Informed Consent: I have reviewed the patients History and Physical, chart, labs and discussed the procedure including the risks, benefits and alternatives for the proposed anesthesia with the patient or authorized representative who has indicated his/her understanding and acceptance.     Plan Discussed with:   Anesthesia Plan Comments:         Anesthesia Quick Evaluation

## 2012-03-17 NOTE — Transfer of Care (Signed)
Immediate Anesthesia Transfer of Care Note  Patient: [name] [name]  Procedure(s) Performed: Procedure(s) (LRB): CIRCUMCISION ADULT (N/A)  Patient Location: PACU  Anesthesia Type: Spinal  Level of Consciousness: awake, alert , oriented and patient cooperative  Airway & Oxygen Therapy: Patient Spontanous Breathing and Patient connected to face mask oxygen  Post-op Assessment: Report given to PACU RN, Post -op Vital signs reviewed and stable and Patient moving all extremities  Post vital signs: Reviewed and stable  Complications: No apparent anesthesia complications

## 2012-03-17 NOTE — Consult Note (Signed)
NAMEJETSON, PICKREL                 ACCOUNT NO.:  [id number]  MEDICAL RECORD NO.:  [id number]  LOCATION:                                 FACILITY:  PHYSICIAN:  Ky Barban, M.D.DATE OF BIRTH:  09-Mar-1961  DATE OF CONSULTATION: DATE OF DISCHARGE:                                CONSULTATION   CHIEF COMPLAINT:  Phimosis.  HISTORY:  This 51 year old gentleman who was having problem with his foreskin.  He says it breaks down when he has erection or sexual intercourse and it hurts, and I find out that he has phimosis.  So, I have recommended to undergo circumcision, for which, he is coming as outpatient.  We will undergo circumcision procedure.  Limitations and complications are discussed in detail.  He understands and want me to proceed.  PAST HISTORY:  History of pancreatitis, otherwise unremarkable.  No history of diabetes or hypertension.  PERSONAL HISTORY:  He smokes half pack a day and just drinks beer on the weekends.  ALLERGIES:  Allergic to PENICILLIN.  REVIEW OF SYSTEMS:  Unremarkable.  PHYSICAL EXAMINATION:  VITAL SIGNS:  Blood pressure 132/74, temperature 97.3. CENTRAL NERVOUS SYSTEM:  No gross neurological deficit. HEAD, NECK, EYE, ENT:  Negative. CHEST:  Symmetrical. HEART:  Regular sinus rhythm.  No murmur. ABDOMEN:  Soft, flat.  Liver, spleen, kidneys are not palpable.  No CVA tenderness. GENITOURINARY:  External genitalia is uncircumcised, has phimosis. Testicles are normal. RECTAL:  Deferred. EXTREMITIES:  Normal.  IMPRESSION:  Phimosis.  PLAN:  Circumcision under anesthesia as outpatient.     Ky Barban, M.D.     MIJ/MEDQ  D:  03/16/2012  T:  03/16/2012  Job:  [id number]

## 2012-03-17 NOTE — Progress Notes (Signed)
No change in H&P on reexamination. 

## 2012-03-17 NOTE — Anesthesia Procedure Notes (Addendum)
Spinal  Patient location during procedure: OR Start time: 03/17/2012 9:17 AM Staffing CRNA/Resident: Juley Giovanetti, Sevag J Preanesthetic Checklist Completed: patient identified, site marked, surgical consent, pre-op evaluation, timeout performed, IV checked, risks and benefits discussed and monitors and equipment checked Spinal Block Patient position: left lateral decubitus Prep: Betadine Patient monitoring: heart rate, cardiac monitor, continuous pulse ox and blood pressure Approach: left paramedian Location: L2-3 Injection technique: single-shot Needle Needle type: Spinocan  Needle gauge: 22 G Assessment Sensory level: T10  [id number]     10/2012

## 2012-03-17 NOTE — Anesthesia Postprocedure Evaluation (Signed)
  Anesthesia Post-op Note  Patient: [name] [name]  Procedure(s) Performed: Procedure(s) (LRB): CIRCUMCISION ADULT (N/A)  Patient Location: PACU  Anesthesia Type: Spinal  Level of Consciousness: awake, alert , oriented and patient cooperative  Airway and Oxygen Therapy: Patient Spontanous Breathing  Post-op Pain: none  Post-op Assessment: Post-op Vital signs reviewed, Patient's Cardiovascular Status Stable, Respiratory Function Stable, Patent Airway and No signs of Nausea or vomiting  Post-op Vital Signs: Reviewed and stable  Complications: No apparent anesthesia complications

## 2012-03-17 NOTE — Discharge Instructions (Signed)
Keep it dry and clean call if any bleeding.

## 2012-03-18 NOTE — Op Note (Signed)
NAMEALDRIDGE, KRZYZANOWSKI                 ACCOUNT NO.:  [id number]  MEDICAL RECORD NO.:  [id number]  LOCATION:  [facility]                          FACILITY:  [facility]  PHYSICIAN:  Ky Barban, M.D.DATE OF BIRTH:  November 27, 1961  DATE OF PROCEDURE: DATE OF DISCHARGE:  03/17/2012                              OPERATIVE REPORT   PREOPERATIVE DIAGNOSIS:  Phimosis.  POSTOPERATIVE DIAGNOSIS:  Phimosis.  PROCEDURE:  Circumcision.  ANESTHESIA:  General.  DESCRIPTION OF PROCEDURE:  The patient under general anesthesia, in supine position, usual prep and drape.  Circumferentially I marked the area where I am going to make the incision on the skin next to the corona.  The dorsal slit was made circumferentially.  I first excised the mucosa leaving about 5 mm.  Then 2nd incision was made on the skin circumferentially excising the skin and this isolated the sleeve of skin and mucosa, which was lifted off the penis with the help of the cautery by cauterizing the bleeders.  After making sure all the bleeders are coagulated, some of them were ligated with 3-0 Vicryl.  Skin and mucosa was closed together with interrupted sutures of 3-0 chromic.  At the end of the procedure, I infiltrated 10% of 0.25% Marcaine circumferentially near the incision.  The wound was wrapped in Vaseline gauze and 2 inch Kling.  The patient left the operating room in satisfactory condition.     Ky Barban, M.D.     MIJ/MEDQ  D:  03/17/2012  T:  03/18/2012  Job:  [id number]

## 2012-03-23 ENCOUNTER — Ambulatory Visit (INDEPENDENT_AMBULATORY_CARE_PROVIDER_SITE_OTHER): Payer: Medicare Other | Admitting: Family Medicine

## 2012-03-23 ENCOUNTER — Encounter: Payer: Self-pay | Admitting: Family Medicine

## 2012-03-23 VITALS — BP 130/98 | HR 96 | Resp 16 | Ht 69.5 in | Wt 179.1 lb

## 2012-03-23 DIAGNOSIS — M25569 Pain in unspecified knee: Secondary | ICD-10-CM

## 2012-03-23 DIAGNOSIS — M549 Dorsalgia, unspecified: Secondary | ICD-10-CM

## 2012-03-23 DIAGNOSIS — F1010 Alcohol abuse, uncomplicated: Secondary | ICD-10-CM

## 2012-03-23 MED ORDER — CYCLOBENZAPRINE HCL 10 MG PO TABS: 10.0000 mg | ORAL_TABLET | Freq: Two times a day (BID) | ORAL | Status: AC | PRN

## 2012-03-23 NOTE — Assessment & Plan Note (Signed)
Exam not concerning today, refilled muscle relaxant for use short term.

## 2012-03-23 NOTE — Assessment & Plan Note (Signed)
Patient agrees that he is still drinking alcohol but he has a handle on it. He would not give me the amount of alcohol he is drinking

## 2012-03-23 NOTE — Assessment & Plan Note (Signed)
Persistent knee pain. Patient at this time is being controlled with chronic narcotic medications. I did discuss with him that if he would need chronic narcotics as he continues to get emergency department I can prescribe them as there is concern about his workers comp been denied regarding his Careers adviser. He however did not want to use admit to her drug screen policy and became upset. I will not prescribe medications unless he is on a pain contract he is too high risk with his alcoholism.  I did speak with his surgeon's office, his workers comp was declined. They have been trying to reach him to see if he wants to proceed with is private insurance

## 2012-03-23 NOTE — Progress Notes (Signed)
  Subjective:    Patient ID: [name] [name], male    DOB: 06-05-1961, 51 y.o.   MRN: [id number]  HPI  S/p fall 1 week ago, was awalking and right knee gave out on him. He fell with outstretched hands and hit left lower back. Seen in the ED, no acute problems, prescribed liquid lortab and ibuprofen 800mg  which he said he has not used because it does not help. Continues to have knee pain and swelling, soreness in lower back, and hand pain  Review of Systems  MSK-+joint pain, +right knee swelling  Neuro- no change in bowel or bladder      Objective:   Physical Exam  GEN- NAD, alert and oriented x 3  CVS- RRR, no murmur RESP-CTAB Back- spine non tender, no spasm noted in parapsinals, neg SLR Hip- normal IR/ER bilat Right knee- no effusion, TTP lateral and inferior joint line, pain with stress on lateral and medial ligamen Hand- no bony abnormality, mild TTP along left thumb, normal ROM of thumb joint, no swelling noted Neuro-normal grasp      Assessment & Plan:

## 2012-03-23 NOTE — Patient Instructions (Addendum)
For your back use the muscle relaxant as needed Use the pain medication as prescribed You are on a pain contract , you can not get pain medications from any other provider F/U as previously scheduled

## 2012-03-27 ENCOUNTER — Emergency Department (HOSPITAL_COMMUNITY)
Admission: EM | Admit: 2012-03-27 | Discharge: 2012-03-27 | Disposition: A | Discharge status: Home / Self Care | Payer: Medicare Other | Attending: Emergency Medicine | Admitting: Emergency Medicine

## 2012-03-27 ENCOUNTER — Encounter (HOSPITAL_COMMUNITY): Payer: Self-pay

## 2012-03-27 DIAGNOSIS — Z79899 Other long term (current) drug therapy: Secondary | ICD-10-CM | POA: Insufficient documentation

## 2012-03-27 DIAGNOSIS — IMO0001 Reserved for inherently not codable concepts without codable children: Secondary | ICD-10-CM

## 2012-03-27 DIAGNOSIS — T8131XA Disruption of external operation (surgical) wound, not elsewhere classified, initial encounter: Secondary | ICD-10-CM | POA: Insufficient documentation

## 2012-03-27 DIAGNOSIS — K219 Gastro-esophageal reflux disease without esophagitis: Secondary | ICD-10-CM | POA: Insufficient documentation

## 2012-03-27 DIAGNOSIS — F17200 Nicotine dependence, unspecified, uncomplicated: Secondary | ICD-10-CM | POA: Insufficient documentation

## 2012-03-27 DIAGNOSIS — Y849 Medical procedure, unspecified as the cause of abnormal reaction of the patient, or of later complication, without mention of misadventure at the time of the procedure: Secondary | ICD-10-CM | POA: Insufficient documentation

## 2012-03-27 DIAGNOSIS — F319 Bipolar disorder, unspecified: Secondary | ICD-10-CM | POA: Insufficient documentation

## 2012-03-27 DIAGNOSIS — E785 Hyperlipidemia, unspecified: Secondary | ICD-10-CM | POA: Insufficient documentation

## 2012-03-27 DIAGNOSIS — M109 Gout, unspecified: Secondary | ICD-10-CM | POA: Insufficient documentation

## 2012-03-27 MED ORDER — OXYCODONE-ACETAMINOPHEN 5-325 MG PO TABS
1.0000 | ORAL_TABLET | Freq: Once | ORAL | Status: AC
  Administered 2012-03-27: 1 via ORAL
  Filled 2012-03-27: qty 1

## 2012-03-27 MED ORDER — BACITRACIN ZINC 500 UNIT/GM EX OINT
TOPICAL_OINTMENT | CUTANEOUS | Status: AC
  Administered 2012-03-27: 14:00:00
  Filled 2012-03-27: qty 0.9

## 2012-03-27 NOTE — ED Notes (Signed)
Dr. Jerre Simon wrote prescription for Percocet 7.5/325mg  one PO every 6 hrs #30

## 2012-03-27 NOTE — ED Notes (Signed)
Pt presents with need for wound check. Pt had circumsision done 8 days ago. Pt was having sexual intercourse this AM and thinks one of the sutures came undone.

## 2012-03-27 NOTE — Discharge Instructions (Signed)
Dressing Change   Dressings are placed over wounds to keep them clean, dry, and protected from further injury. This provides an environment that favors wound healing. Good wound care includes resting and elevating the injured part until the pain and swelling are better. Change your wound dressing as recommended by your caregiver.   When removing an old dressing, lift it slowly away from the wound. If the dressing sticks to the wound, dampen it with half-strength peroxide or tap water. Clean the wound gently with a moist cloth, remove any loose material, and apply antibiotic ointment if recommended by your caregiver. Usually it is okay for a wound to get wet. Wash it with mildly soapy water. Watch for signs of infection when changing a dressing.   SEEK MEDICAL CARE IF:   You develop increased pain, redness, or swelling.   You have pus-like drainage from the wound.   You develop a fever greater than 100.4 F (38 C).   Document Released: 01/16/2005 Document Revised: 11/28/2011 Document Reviewed: 10/21/2011   [employer] Patient Information 2012 [employer].

## 2012-03-27 NOTE — ED Notes (Signed)
MD at bedside.-Javaid in room to assess pt

## 2012-03-27 NOTE — ED Provider Notes (Signed)
History     CSN: [id number]  Arrival date & time 03/27/12  1205   First MD Initiated Contact with Patient 03/27/12 1225      Chief Complaint  Patient presents with  . Wound Check    (Consider location/radiation/quality/duration/timing/severity/associated sxs/prior treatment) HPI Comments: Patient comes to emergency department with complaint of bleeding around his penis since just before ED arrival. He states he had a circumcision performed by Dr. Jerre Simon on 03/18/2012 and states he tried to have intercourse earlier today and states his sutures came undone.  He denies fever, swelling, or inability to urinate.  States the bleeding stopped after applying pressure for several minutes.    Patient is a 51 y.o. male presenting with wound check. The history is provided by the patient. No language interpreter was used.  Wound Check     Past Medical History  Diagnosis Date  . Bronchitis   . Acid reflux   . Pancreatitis   . Gout   . Bipolar disorder   . Hyperlipidemia   . Alcohol abuse   . Pseudocyst of pancreas 02/28/2012  . Arthritis     Past Surgical History  Procedure Date  . Nasal suregery     broken nose  . Circumcision 03/17/2012    Procedure: CIRCUMCISION ADULT;  Surgeon: Ky Barban, MD;  Location: [facility];  Service: Urology;  Laterality: N/A;    Family History  Problem Relation Age of Onset  . Diabetes Father   . Anesthesia problems Neg Hx   . Hypotension Neg Hx   . Malignant hyperthermia Neg Hx   . Pseudochol deficiency Neg Hx     History  Substance Use Topics  . Smoking status: Current Everyday Smoker -- 0.5 packs/day for 25 years    Types: Cigarettes  . Smokeless tobacco: Not on file  . Alcohol Use: 29.4 oz/week    35 Cans of beer, 14 Shots of liquor per week     Daily      Review of Systems  Genitourinary: Positive for penile pain. Negative for hematuria, decreased urine volume, discharge and difficulty urinating.  All other systems reviewed and  are negative.    Allergies  Penicillins  Home Medications   Current Outpatient Rx  Name Route Sig Dispense Refill  . CITALOPRAM HYDROBROMIDE 20 MG PO TABS Oral Take 1 tablet (20 mg total) by mouth daily. 30 tablet 2  . CYCLOBENZAPRINE HCL 10 MG PO TABS Oral Take 1 tablet (10 mg total) by mouth 2 (two) times daily as needed for muscle spasms. 40 tablet 0  . FEXOFENADINE HCL 180 MG PO TABS Oral Take 1 tablet (180 mg total) by mouth daily. 30 tablet 2  . FLUTICASONE PROPIONATE 50 MCG/ACT NA SUSP Nasal Place 1 spray into the nose 2 (two) times daily. 16 g 2  . GEMFIBROZIL 600 MG PO TABS Oral Take 1 tablet (600 mg total) by mouth 2 (two) times daily before a meal. FOR TREATMENT OF YOUR HIGH TRIGLYCERIDE LEVEL. 60 tablet 3  . HYDROCODONE-ACETAMINOPHEN 7.5-500 MG/15ML PO SOLN Oral Take 7.5 mLs by mouth every 6 (six) hours as needed for pain. 60 mL 0  . HYDROCODONE-ACETAMINOPHEN 5-325 MG PO TABS Oral Take 1 tablet by mouth every 6 (six) hours as needed.    Marland Kitchen OMEPRAZOLE 20 MG PO CPDR Oral Take 1 capsule (20 mg total) by mouth daily. 30 capsule 1  . PROMETHAZINE HCL 12.5 MG PO TABS Oral Take 2 tablets (25 mg total) by mouth every 6 (  six) hours as needed for nausea. 10 tablet 0  . PROMETHAZINE HCL 12.5 MG PO TABS Oral Take 1 tablet (12.5 mg total) by mouth every 6 (six) hours as needed. 30 tablet 0  . PROMETHAZINE HCL 25 MG PO TABS Oral Take 1 tablet (25 mg total) by mouth every 6 (six) hours as needed for nausea. 20 tablet 0  . TRAZODONE HCL 100 MG PO TABS Oral Take 100 mg by mouth at bedtime. Sleep      BP 121/82  Pulse 100  Temp(Src) 98.6 F (37 C) (Oral)  Resp 20  Ht 5\' 10"  (1.778 m)  Wt 189 lb (85.73 kg)  BMI 27.12 kg/m2  SpO2 100%  Physical Exam  Nursing note and vitals reviewed. Constitutional: He is oriented to person, place, and time. He appears well-developed and well-nourished. No distress.  HENT:  Head: Normocephalic and atraumatic.  Neck: Neck supple.  Cardiovascular:  Normal rate, regular rhythm and normal heart sounds.   Pulmonary/Chest: Effort normal and breath sounds normal. No respiratory distress.  Abdominal: Soft. He exhibits no distension. There is no tenderness.  Genitourinary:       Sutures of the foreskin around the area of the glans of the penis. Wound dehiscence of the dorsal aspect.  Bleeding is controlled at this time. No edema.  Musculoskeletal: Normal range of motion. He exhibits no tenderness.  Neurological: He is alert and oriented to person, place, and time. He exhibits normal muscle tone. Coordination normal.  Skin: Skin is warm and dry.       See GU exam    ED Course  Procedures (including critical care time)       MDM    1:05 PM I have consulted Dr. Jerre Simon.  He agrees to see patient in the ED for further management.    1:48 PM Dr. Jerre Simon has seen the patient in the ED exam room and applied a xeroform gauze dressing.  He has written a Rx for Percocet and will see the patient in his office on Tuesday 03/31/12.        Asher Torpey L. Kindle Strohmeier, [place] 03/27/12 1353

## 2012-03-27 NOTE — ED Notes (Signed)
Entered pt's room, pt eating fries and a burger, requesting pain meds for throbbing to penis, rates 7/10

## 2012-03-27 NOTE — ED Notes (Signed)
Pt .received prescription for percocet along with d/c instructions

## 2012-03-29 NOTE — ED Provider Notes (Signed)
Medical screening examination/treatment/procedure(s) were performed by non-physician practitioner and as supervising physician I was immediately available for consultation/collaboration.   Benny Lennert, MD 03/29/12 [phone]

## 2012-04-08 ENCOUNTER — Emergency Department (HOSPITAL_COMMUNITY)
Admission: EM | Admit: 2012-04-08 | Discharge: 2012-04-08 | Disposition: A | Discharge status: Home / Self Care | Payer: Medicare Other | Attending: Emergency Medicine | Admitting: Emergency Medicine

## 2012-04-08 ENCOUNTER — Encounter (HOSPITAL_COMMUNITY): Payer: Self-pay | Admitting: *Deleted

## 2012-04-08 DIAGNOSIS — F17200 Nicotine dependence, unspecified, uncomplicated: Secondary | ICD-10-CM | POA: Insufficient documentation

## 2012-04-08 DIAGNOSIS — F1010 Alcohol abuse, uncomplicated: Secondary | ICD-10-CM | POA: Insufficient documentation

## 2012-04-08 DIAGNOSIS — F319 Bipolar disorder, unspecified: Secondary | ICD-10-CM | POA: Insufficient documentation

## 2012-04-08 DIAGNOSIS — K219 Gastro-esophageal reflux disease without esophagitis: Secondary | ICD-10-CM | POA: Insufficient documentation

## 2012-04-08 DIAGNOSIS — R10819 Abdominal tenderness, unspecified site: Secondary | ICD-10-CM | POA: Insufficient documentation

## 2012-04-08 DIAGNOSIS — R109 Unspecified abdominal pain: Secondary | ICD-10-CM | POA: Insufficient documentation

## 2012-04-08 LAB — COMPREHENSIVE METABOLIC PANEL
ALT: 18 U/L (ref 0–53)
AST: 46 U/L — ABNORMAL HIGH (ref 0–37)
Albumin: 3.7 g/dL (ref 3.5–5.2)
Alkaline Phosphatase: 59 U/L (ref 39–117)
BUN: 14 mg/dL (ref 6–23)
CO2: 27 mEq/L (ref 19–32)
Calcium: 9.3 mg/dL (ref 8.4–10.5)
Chloride: 92 mEq/L — ABNORMAL LOW (ref 96–112)
Creatinine, Ser: 0.87 mg/dL (ref 0.50–1.35)
GFR calc Af Amer: >90 mL/min (ref >90)
GFR calc non Af Amer: >90 mL/min (ref >90)
Glucose, Bld: 78 mg/dL (ref 70–99)
Potassium: 3.8 mEq/L (ref 3.5–5.1)
Sodium: 131 mEq/L — ABNORMAL LOW (ref 135–145)
Total Bilirubin: 0.6 mg/dL (ref 0.3–1.2)
Total Protein: 7.5 g/dL (ref 6.0–8.3)

## 2012-04-08 LAB — CBC
HCT: 41.1 % (ref 39.0–52.0)
Hemoglobin: 13.6 g/dL (ref 13.0–17.0)
MCH: 32.4 pg (ref 26.0–34.0)
MCHC: 33.1 g/dL (ref 30.0–36.0)
MCV: 97.9 fL (ref 78.0–100.0)
Platelets: 341 10*3/uL (ref 150–400)
RBC: 4.2 MIL/uL — ABNORMAL LOW (ref 4.22–5.81)
RDW: 15.1 % (ref 11.5–15.5)
WBC: 10.3 10*3/uL (ref 4.0–10.5)

## 2012-04-08 LAB — LIPASE, BLOOD: Lipase: 32 U/L (ref 11–59)

## 2012-04-08 MED ORDER — PANTOPRAZOLE SODIUM 40 MG IV SOLR
40.0000 mg | Freq: Once | INTRAVENOUS | Status: AC
  Administered 2012-04-08: 40 mg via INTRAVENOUS
  Filled 2012-04-08: qty 40

## 2012-04-08 MED ORDER — HYDROMORPHONE HCL PF 1 MG/ML IJ SOLN
1.0000 mg | Freq: Once | INTRAMUSCULAR | Status: AC
  Administered 2012-04-08: 1 mg via INTRAVENOUS
  Filled 2012-04-08: qty 1

## 2012-04-08 MED ORDER — ONDANSETRON HCL 4 MG/2ML IJ SOLN
4.0000 mg | Freq: Once | INTRAMUSCULAR | Status: AC
  Administered 2012-04-08: 4 mg via INTRAVENOUS
  Filled 2012-04-08: qty 2

## 2012-04-08 MED ORDER — SODIUM CHLORIDE 0.9 % IV SOLN
Freq: Once | INTRAVENOUS | Status: AC
  Administered 2012-04-08: 18:00:00 via INTRAVENOUS

## 2012-04-08 NOTE — ED Notes (Signed)
Assumed care for discharge only.  Discharge instructions given and reviewed with patient.  Patient verbalized understanding to do bland diet for the 8-10 hours and then progress as tolerated.  Patient ambulatory; discharged home in good condition.

## 2012-04-08 NOTE — Discharge Instructions (Signed)
Bland diet for the next 8-10 hours then progress as tolerated. Avoid drinking alcohol.   Abdominal Pain Many things can cause belly (abdominal) pain. Most times, the belly pain is not dangerous. The amount of belly pain does not tell how serious the problem may be. Many cases of belly pain can be watched and treated at home. HOME CARE   Do not take medicines that help you go poop (laxatives) unless told to by your doctor.   Only take medicine as told by your doctor.   Eat or drink as told by your doctor. Your doctor will tell you if you should be on a special diet.  GET HELP RIGHT AWAY IF:   The pain does not go away.   You have a fever.   You keep throwing up (vomiting).   The pain changes and is only in the right or left part of the belly.   You have bloody or tarry looking poop.  MAKE SURE YOU:   Understand these instructions.   Will watch your condition.   Will get help right away if you are not doing well or get worse.  Document Released: 05/27/2008 Document Revised: 11/28/2011 Document Reviewed: 12/25/2009 [employer] Patient Information 2012 [place], [place].

## 2012-04-08 NOTE — ED Notes (Signed)
Pt in room eating at this time. Pt was informed that he was not able to eat until doctor gave him the ok. Dr. Colon Branch made aware of pt eating.

## 2012-04-08 NOTE — ED Notes (Addendum)
Pt eating food and drink.

## 2012-04-08 NOTE — ED Notes (Signed)
Pt requesting pain medication.  

## 2012-04-08 NOTE — ED Provider Notes (Signed)
History     CSN: [id number]  Arrival date & time 04/08/12  1418   First MD Initiated Contact with Patient 04/08/12 1654      Chief Complaint  Patient presents with  . Abdominal Pain  . Back Pain    (Consider location/radiation/quality/duration/timing/severity/associated sxs/prior treatment) HPI [name] [name] is a 51 y.o. male with a h/o pancreatitis, GERD, arthritis, alcohol abuse who presents to the Emergency Department complaining of abdominal pain that radiates to his back that began this morning. He admits to drinking beer yesterday. He denies fever, chills, vomiting, shortness of breath, cough, chest pain.    Past Medical History  Diagnosis Date  . Bronchitis   . Acid reflux   . Pancreatitis   . Gout   . Bipolar disorder   . Hyperlipidemia   . Alcohol abuse   . Pseudocyst of pancreas 02/28/2012  . Arthritis     Past Surgical History  Procedure Date  . Nasal suregery     broken nose  . Circumcision 03/17/2012    Procedure: CIRCUMCISION ADULT;  Surgeon: Ky Barban, MD;  Location: [facility];  Service: Urology;  Laterality: N/A;    Family History  Problem Relation Age of Onset  . Diabetes Father   . Anesthesia problems Neg Hx   . Hypotension Neg Hx   . Malignant hyperthermia Neg Hx   . Pseudochol deficiency Neg Hx     History  Substance Use Topics  . Smoking status: Current Everyday Smoker -- 0.5 packs/day for 25 years    Types: Cigarettes  . Smokeless tobacco: Not on file  . Alcohol Use: 29.4 oz/week    35 Cans of beer, 14 Shots of liquor per week     Daily      Review of Systems ROS: Statement: All systems negative except as marked or noted in the HPI; Constitutional: Negative for fever and chills. ; ; Eyes: Negative for eye pain, redness and discharge. ; ; ENMT: Negative for ear pain, hoarseness, nasal congestion, sinus pressure and sore throat. ; ; Cardiovascular: Negative for chest pain, palpitations, diaphoresis, dyspnea and peripheral edema. ; ;  Respiratory: Negative for cough, wheezing and stridor. ; ; Gastrointestinal: Negative for nausea,  abdominal pain, blood in stool, hematemesis, jaundice and rectal bleeding. . ; ; Genitourinary: Negative for dysuria, flank pain and hematuria. ; ; Musculoskeletal: Negative for neck pain. Negative for swelling and trauma.; ; Skin: Negative for pruritus, rash, abrasions, blisters, bruising and skin lesion.; ; Neuro: Negative for headache, lightheadedness and neck stiffness. Negative for weakness, altered level of consciousness , altered mental status, extremity weakness, paresthesias, involuntary movement, seizure and syncope.    Allergies  Penicillins  Home Medications   Current Outpatient Rx  Name Route Sig Dispense Refill  . CITALOPRAM HYDROBROMIDE 20 MG PO TABS Oral Take 1 tablet (20 mg total) by mouth daily. 30 tablet 2  . FEXOFENADINE HCL 180 MG PO TABS Oral Take 1 tablet (180 mg total) by mouth daily. 30 tablet 2  . FLUTICASONE PROPIONATE 50 MCG/ACT NA SUSP Nasal Place 1 spray into the nose 2 (two) times daily. 16 g 2  . GEMFIBROZIL 600 MG PO TABS Oral Take 1 tablet (600 mg total) by mouth 2 (two) times daily before a meal. FOR TREATMENT OF YOUR HIGH TRIGLYCERIDE LEVEL. 60 tablet 3  . HYDROCODONE-ACETAMINOPHEN 5-325 MG PO TABS Oral Take 1 tablet by mouth every 6 (six) hours as needed.    Marland Kitchen OMEPRAZOLE 20 MG PO CPDR Oral Take  1 capsule (20 mg total) by mouth daily. 30 capsule 1  . PROMETHAZINE HCL 12.5 MG PO TABS Oral Take 2 tablets (25 mg total) by mouth every 6 (six) hours as needed for nausea. 10 tablet 0  . PROMETHAZINE HCL 12.5 MG PO TABS Oral Take 1 tablet (12.5 mg total) by mouth every 6 (six) hours as needed. 30 tablet 0  . PROMETHAZINE HCL 25 MG PO TABS Oral Take 1 tablet (25 mg total) by mouth every 6 (six) hours as needed for nausea. 20 tablet 0  . TRAZODONE HCL 100 MG PO TABS Oral Take 100 mg by mouth at bedtime. Sleep      BP 138/79  Pulse 94  Temp(Src) 98.7 F (37.1 C) (Oral)   Resp 20  Ht 5\' 10"  (1.778 m)  Wt 180 lb (81.647 kg)  BMI 25.83 kg/m2  SpO2 98%  Physical Exam  Nursing note and vitals reviewed. Constitutional:       Awake, alert, nontoxic appearance with baseline speech for patient.  HENT:  Head: Atraumatic.  Mouth/Throat: No oropharyngeal exudate.  Eyes: EOM are normal. Pupils are equal, round, and reactive to light. Right eye exhibits no discharge. Left eye exhibits no discharge.  Neck: Neck supple.  Cardiovascular: Normal rate and regular rhythm.   No murmur heard. Pulmonary/Chest: Effort normal and breath sounds normal. No stridor. No respiratory distress. He has no wheezes. He has no rales. He exhibits no tenderness.  Abdominal: Soft. Bowel sounds are normal. He exhibits no mass. There is tenderness. There is no rebound and no guarding.       Epigastric pain with palpation  Musculoskeletal: He exhibits no tenderness.       Baseline ROM, moves extremities with no obvious new focal weakness.  Lymphadenopathy:    He has no cervical adenopathy.  Neurological:       Awake, alert, cooperative and aware of situation; motor strength bilaterally; sensation normal to light touch bilaterally; peripheral visual fields full to confrontation; no facial asymmetry; tongue midline; major cranial nerves appear intact; no pronator drift, normal finger to nose bilaterally, baseline gait without new ataxia.  Skin: No rash noted.  Psychiatric: He has a normal mood and affect.    ED Course  Procedures (including critical care time) Results for orders placed during the hospital encounter of 04/08/12  CBC      Component Value Range   WBC 10.3  4.0 - 10.5 (K/uL)   RBC 4.20 (*) 4.22 - 5.81 (MIL/uL)   Hemoglobin 13.6  13.0 - 17.0 (g/dL)   HCT 40.9  81.1 - 91.4 (%)   MCV 97.9  78.0 - 100.0 (fL)   MCH 32.4  26.0 - 34.0 (pg)   MCHC 33.1  30.0 - 36.0 (g/dL)   RDW 78.2  95.6 - 21.3 (%)   Platelets 341  150 - 400 (K/uL)  COMPREHENSIVE METABOLIC PANEL       Component Value Range   Sodium 131 (*) 135 - 145 (mEq/L)   Potassium 3.8  3.5 - 5.1 (mEq/L)   Chloride 92 (*) 96 - 112 (mEq/L)   CO2 27  19 - 32 (mEq/L)   Glucose, Bld 78  70 - 99 (mg/dL)   BUN 14  6 - 23 (mg/dL)   Creatinine, Ser 0.86  0.50 - 1.35 (mg/dL)   Calcium 9.3  8.4 - 57.8 (mg/dL)   Total Protein 7.5  6.0 - 8.3 (g/dL)   Albumin 3.7  3.5 - 5.2 (g/dL)   AST 46 (*)  0 - 37 (U/L)   ALT 18  0 - 53 (U/L)   Alkaline Phosphatase 59  39 - 117 (U/L)   Total Bilirubin 0.6  0.3 - 1.2 (mg/dL)   GFR calc non Af Amer >90  >90 (mL/min)   GFR calc Af Amer >90  >90 (mL/min)  LIPASE, BLOOD      Component Value Range   Lipase 32  11 - 59 (U/L)     MDM  Patient with a h/o pancreatitis here with abdominal pain that radiates through to the back that began this morning. Patient had been drinking yesterday. Labs unremarkable. Given IVF, analgesics and antiemetics with improvement of pain. Patient able to eat a meal while in the ER. Pt stable in ED with no significant deterioration in condition.The patient appears reasonably screened and/or stabilized for discharge and I doubt any other medical condition or other [facility] requiring further screening, evaluation, or treatment in the ED at this time prior to discharge.  MDM Reviewed: nursing note and vitals Interpretation: labs           Nicoletta Dress. Colon Branch, MD 04/08/12 2029

## 2012-04-08 NOTE — ED Notes (Signed)
LLQ pain and lower back pain that started this morning.  Denies v/d.

## 2012-04-09 ENCOUNTER — Ambulatory Visit: Payer: Medicare Other | Admitting: Family Medicine

## 2012-04-09 ENCOUNTER — Encounter: Payer: Self-pay | Admitting: Family Medicine

## 2012-04-09 ENCOUNTER — Ambulatory Visit (INDEPENDENT_AMBULATORY_CARE_PROVIDER_SITE_OTHER): Payer: Medicare Other | Admitting: Family Medicine

## 2012-04-09 DIAGNOSIS — R109 Unspecified abdominal pain: Secondary | ICD-10-CM

## 2012-04-09 DIAGNOSIS — G8929 Other chronic pain: Secondary | ICD-10-CM

## 2012-04-09 DIAGNOSIS — F1010 Alcohol abuse, uncomplicated: Secondary | ICD-10-CM

## 2012-04-09 NOTE — Progress Notes (Signed)
  Subjective:    Patient ID: [name] [name], male    DOB: 04-May-1961, 51 y.o.   MRN: [id number]  HPI Pt seen in ED for abd pain after drinking ETOH, he has had the same pain on and off for past week. He denies ETOH today. Labs reviewed unremarkable discharged home after IV pain meds given. Today he presents for f/u and asking for pain meds. He continues to decline chronic narcotic policy. He declined examination. I also advised him of the surgeon's offer to proceed if he is willing to use his insurance to do so since his worker's comp was declined, but he declined this.    Review of Systems - per above     Objective:   Physical Exam  GEN-NAD, alert and oriented x 3 EXAM-Declined by pt     Assessment & Plan:     Abd pain- recurrent in setting of ETOH use, fairly normal labs, tolerating by mouth, normal stools.   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

## 2012-04-09 NOTE — Patient Instructions (Signed)
F/U 4 months Continue current meds Call the surgeon if you want your knee fixed

## 2012-04-15 ENCOUNTER — Emergency Department (HOSPITAL_COMMUNITY): Payer: Medicare Other

## 2012-04-15 ENCOUNTER — Emergency Department (HOSPITAL_COMMUNITY)
Admission: EM | Admit: 2012-04-15 | Discharge: 2012-04-15 | Disposition: A | Discharge status: Home / Self Care | Payer: Medicare Other | Attending: Emergency Medicine | Admitting: Emergency Medicine

## 2012-04-15 ENCOUNTER — Encounter (HOSPITAL_COMMUNITY): Payer: Self-pay | Admitting: Emergency Medicine

## 2012-04-15 DIAGNOSIS — E785 Hyperlipidemia, unspecified: Secondary | ICD-10-CM | POA: Insufficient documentation

## 2012-04-15 DIAGNOSIS — K219 Gastro-esophageal reflux disease without esophagitis: Secondary | ICD-10-CM | POA: Insufficient documentation

## 2012-04-15 DIAGNOSIS — S82832A Other fracture of upper and lower end of left fibula, initial encounter for closed fracture: Secondary | ICD-10-CM

## 2012-04-15 DIAGNOSIS — Z8639 Personal history of other endocrine, nutritional and metabolic disease: Secondary | ICD-10-CM | POA: Insufficient documentation

## 2012-04-15 DIAGNOSIS — Z862 Personal history of diseases of the blood and blood-forming organs and certain disorders involving the immune mechanism: Secondary | ICD-10-CM | POA: Insufficient documentation

## 2012-04-15 DIAGNOSIS — M25579 Pain in unspecified ankle and joints of unspecified foot: Secondary | ICD-10-CM | POA: Insufficient documentation

## 2012-04-15 DIAGNOSIS — M129 Arthropathy, unspecified: Secondary | ICD-10-CM | POA: Insufficient documentation

## 2012-04-15 DIAGNOSIS — F319 Bipolar disorder, unspecified: Secondary | ICD-10-CM | POA: Insufficient documentation

## 2012-04-15 DIAGNOSIS — S82899A Other fracture of unspecified lower leg, initial encounter for closed fracture: Secondary | ICD-10-CM | POA: Insufficient documentation

## 2012-04-15 DIAGNOSIS — Z79899 Other long term (current) drug therapy: Secondary | ICD-10-CM | POA: Insufficient documentation

## 2012-04-15 DIAGNOSIS — X500XXA Overexertion from strenuous movement or load, initial encounter: Secondary | ICD-10-CM | POA: Insufficient documentation

## 2012-04-15 MED ORDER — OXYCODONE-ACETAMINOPHEN 5-325 MG PO TABS
2.0000 | ORAL_TABLET | Freq: Once | ORAL | Status: AC
  Administered 2012-04-15: 2 via ORAL
  Filled 2012-04-15: qty 2

## 2012-04-15 MED ORDER — OXYCODONE-ACETAMINOPHEN 5-325 MG PO TABS: ORAL_TABLET | ORAL | Status: DC

## 2012-04-15 NOTE — ED Provider Notes (Signed)
History   This chart was scribed for [name] Anger, DO by Clarita Crane. The patient was seen in room [facility]. Patient's care was started at 0718.    CSN: [id number]  Arrival date & time 04/15/12  0718   First MD Initiated Contact with Patient 04/15/12 [phone]      Chief Complaint  Patient presents with  . Ankle Pain    HPI Pt was seen at 7:31 AM [name] [name] is a 51 y.o. male seen at 7:31 AM who presents to the Emergency Department complaining of sudden onset and persistence of constant left ankle "pain" that began yesterday after he "stepped into a hole."  Patient notes the ankle pain worsens with palpation of the area and weight bearing.  He as not been ambulatory on his ankle due to pain.  Denies left foot pain, no left knee pain, no numbness/tingling in extremity, no focal motor weakness, no head injury.     Past Medical History  Diagnosis Date  . Bronchitis   . Acid reflux   . Pancreatitis   . Gout   . Bipolar disorder   . Hyperlipidemia   . Alcohol abuse   . Pseudocyst of pancreas 02/28/2012  . Arthritis     Past Surgical History  Procedure Date  . Nasal suregery     broken nose  . Circumcision 03/17/2012    Procedure: CIRCUMCISION ADULT;  Surgeon: Ky Barban, MD;  Location: [facility];  Service: Urology;  Laterality: N/A;    Family History  Problem Relation Age of Onset  . Diabetes Father   . Anesthesia problems Neg Hx   . Hypotension Neg Hx   . Malignant hyperthermia Neg Hx   . Pseudochol deficiency Neg Hx     History  Substance Use Topics  . Smoking status: Current Everyday Smoker -- 0.5 packs/day for 25 years    Types: Cigarettes  . Smokeless tobacco: Not on file  . Alcohol Use: 29.4 oz/week    35 Cans of beer, 14 Shots of liquor per week     Daily    Review of Systems ROS: Statement: All systems negative except as marked or noted in the HPI; Constitutional: Negative for fever and chills. ; ; Eyes: Negative for eye pain, redness and  discharge. ; ; ENMT: Negative for ear pain, hoarseness, nasal congestion, sinus pressure and sore throat. ; ; Cardiovascular: Negative for chest pain, palpitations, diaphoresis, dyspnea and peripheral edema. ; ; Respiratory: Negative for cough, wheezing and stridor. ; ; Gastrointestinal: Negative for nausea, vomiting, diarrhea, abdominal pain, blood in stool, hematemesis, jaundice and rectal bleeding. . ; ; Genitourinary: Negative for dysuria, flank pain and hematuria. ; ; Musculoskeletal: +left ankle pain.  Negative for back pain and neck pain.; Skin: Negative for pruritus, rash, abrasions, blisters, bruising and skin lesion.; ; Neuro: Negative for headache, lightheadedness and neck stiffness. Negative for weakness, altered level of consciousness , altered mental status, extremity weakness, paresthesias, involuntary movement, seizure and syncope.     Allergies  Penicillins  Home Medications   Current Outpatient Rx  Name Route Sig Dispense Refill  . CITALOPRAM HYDROBROMIDE 20 MG PO TABS Oral Take 1 tablet (20 mg total) by mouth daily. 30 tablet 2  . FEXOFENADINE HCL 180 MG PO TABS Oral Take 1 tablet (180 mg total) by mouth daily. 30 tablet 2  . GEMFIBROZIL 600 MG PO TABS Oral Take 1 tablet (600 mg total) by mouth 2 (two) times daily before a meal. FOR TREATMENT OF  YOUR HIGH TRIGLYCERIDE LEVEL. 60 tablet 3  . OMEPRAZOLE 20 MG PO CPDR Oral Take 1 capsule (20 mg total) by mouth daily. 30 capsule 1  . TRAZODONE HCL 100 MG PO TABS Oral Take 100 mg by mouth at bedtime. Sleep    . HYDROCODONE-ACETAMINOPHEN 5-325 MG PO TABS Oral Take 1 tablet by mouth every 6 (six) hours as needed.    Marland Kitchen PROMETHAZINE HCL 12.5 MG PO TABS Oral Take 2 tablets (25 mg total) by mouth every 6 (six) hours as needed for nausea. 10 tablet 0  . PROMETHAZINE HCL 12.5 MG PO TABS Oral Take 1 tablet (12.5 mg total) by mouth every 6 (six) hours as needed. 30 tablet 0  . PROMETHAZINE HCL 25 MG PO TABS Oral Take 1 tablet (25 mg total) by  mouth every 6 (six) hours as needed for nausea. 20 tablet 0    BP 136/88  Pulse 89  Temp(Src) 98.4 F (36.9 C) (Oral)  Resp 17  Ht 5' 9.5" (1.765 m)  Wt 186 lb (84.369 kg)  BMI 27.07 kg/m2  SpO2 97%  Physical Exam Exam performed at 7:34AM Physical examination:  Nursing notes reviewed; Vital signs and O2 SAT reviewed;  Constitutional: Well developed, Well nourished, Well hydrated, In no acute distress; Head:  Normocephalic, atraumatic; Eyes: EOMI, PERRL, No scleral icterus; ENMT: Mouth and pharynx normal, Mucous membranes moist; Neck: Supple, Full range of motion,; Cardiovascular: Regular rate and rhythm, No murmur, rub, or gallop; Respiratory: Breath sounds clear & equal bilaterally, No rales, rhonchi, wheezes, or rub, Normal respiratory effort/excursion; Chest: Nontender, Movement normal; Extremities: Pulses normal, +TTP left lateral malleolar/distal fibula area with mild localized edema, no open wounds, no ecchymosis, no tenting of skin, no erythema.  NMS intact left foot, strong pedal pp, LE muscle compartments soft.  No left proximal fibular head tenderness, no left knee tenderness, no left foot tenderness.  No deformity, no ecchymosis, no open wounds.  +plantarflexion of left foot w/calf squeeze.  No palpable gap left Achilles's tendon.  Decreased ROM F/E d/t pain.; No calf edema or asymmetry.; Neuro: AA&Ox3, Major CN grossly intact.  No gross focal motor or sensory deficits in extremities.; Skin: Color normal, Warm, Dry, no rash.    ED Course  Procedures  MDM  MDM Reviewed: nursing note and vitals Interpretation: x-ray   Dg Tibia/fibula Left 04/15/2012  *RADIOLOGY REPORT*  Clinical Data: Injury with pain  LEFT TIBIA AND FIBULA - 2 VIEW  Comparison: Same day  Findings: These films again demonstrate the oblique fracture of the distal fibula.  Otherwise, no abnormality is seen affecting the tibia or fibula.  See ankle report.  IMPRESSION: No other abnormality other than the oblique  fracture of the distal fibula described at the ankle exam.  Original Report Authenticated By: Thomasenia Sales, M.D.   Dg Ankle Complete Left 04/15/2012  *RADIOLOGY REPORT*  Clinical Data: Injury 1 day ago.  Pain.  LEFT ANKLE COMPLETE - 3+ VIEW  Comparison: None.  Findings: The patient has an oblique fracture of the distal fibula with associated soft tissue swelling.  No other acute bony or joint abnormality is identified.  IMPRESSION: Distal fibular fracture.  Original Report Authenticated By: Bernadene Bell. D'ALESSIO, M.D.      8:29 AM:   Will place in post splint, crutches, rx pain control and f/u with Ortho MD.  Dx testing d/w pt and family.  Questions answered.  Verb understanding, agreeable to d/c home with outpt f/u.  I personally performed the services described in this documentation, which was scribed in my presence. The recorded information has been reviewed and considered. Umar Patmon Allison Quarry, DO 04/17/12 [phone]

## 2012-04-15 NOTE — ED Notes (Signed)
Pt c/o left ankle pain after stepping into a hole yesterday. Pt states he could not move it this am.

## 2012-04-15 NOTE — Discharge Instructions (Signed)
RESOURCE GUIDE  Dental Problems  Patients with Medicaid: [facility]                     [facility] [address]                                           [address] Phone:  [phone]                                                  Phone:  [phone]  If unable to pay or uninsured, contact:  [facility] or [facility] to become qualified for the adult dental clinic.  Chronic Pain Problems Contact [facility]  [phone] Patients need to be referred by their primary care doctor.  Insufficient Money for Medicine Contact [employer]:  call "211" or [facility] 271-5999.  No Primary Care Doctor Call [facility]  832-8000 Other agencies that provide inexpensive medical care    [facility]  [phone]    [facility]  [phone]    [facility]  [phone]    [facility] Clinic  [phone]    [facility]  [phone]    [facility]  [phone]  Psychological Services [facility]  [phone] [facility]  [phone] [facility]   800 853-5163 (emergency services 641-4993)  Substance Abuse Resources [facility]  336-882-2125 [facility] 336-784-9470 [facility] 336-285-9073 [facility] 336-845-3988 Residential & Outpatient Substance Abuse Program  800-659-3381  Abuse/Neglect [place] Child Abuse Hotline (336) 641-3795 [place] [facility] Abuse Hotline 800-378-5315 (After Hours)  Emergency Shelter [facility] (336) 271-5985  Maternity Homes [facility] (336) 275-9566 [facility] (704) 372-4663  MRSA Hotline #:   832-7006    [place] [facility]  [facility] [facility]     [employer]                          [facility] [address] [place]                       [address]      [address]  [place]                                                [place]                            [place] Phone:  [phone]                                   Phone:  [phone]                 Phone:  [phone]  [facility] Phone:  [phone]    [facility] Child Abuse Hotline 989-686-8022 (920) 176-9208 (After Hours)   Take the prescription as directed.  Apply ice to the area(s) of discomfort, for 15 minutes at a time, several times per day for the next few days.  Do not fall asleep on an ice pack.  Wear the splint and use the crutches until you are seen in follow up by the Orthopedic doctor.  Call the Orthopedic doctor today to schedule a follow up appointment this week.  Return to the Emergency Department immediately if worsening.

## 2012-04-16 ENCOUNTER — Ambulatory Visit (INDEPENDENT_AMBULATORY_CARE_PROVIDER_SITE_OTHER): Payer: Medicare Other | Admitting: Orthopedic Surgery

## 2012-04-16 ENCOUNTER — Encounter: Payer: Self-pay | Admitting: Orthopedic Surgery

## 2012-04-16 VITALS — BP 100/70 | Ht 70.0 in | Wt 186.0 lb

## 2012-04-16 DIAGNOSIS — S82899A Other fracture of unspecified lower leg, initial encounter for closed fracture: Secondary | ICD-10-CM

## 2012-04-16 DIAGNOSIS — S82892A Other fracture of left lower leg, initial encounter for closed fracture: Secondary | ICD-10-CM | POA: Insufficient documentation

## 2012-04-16 MED ORDER — OXYCODONE-ACETAMINOPHEN 10-325 MG PO TABS: 1.0000 | ORAL_TABLET | ORAL | Status: DC | PRN

## 2012-04-16 NOTE — Patient Instructions (Signed)
Keep splint dry and clean   Use crutches   No weight on foot when walking

## 2012-04-16 NOTE — Progress Notes (Signed)
Patient ID: [name] [name], male   DOB: 1961/04/13, 51 y.o.   MRN: [id number] Chief Complaint  Patient presents with  . Ankle Injury    Left ankle fracture. DOI 04-15-12.   Chief complaint: LEFT ankle HPI:(4) He is going to hold fractured his lateral malleolus. He was seen in the emergency room for evaluation. The x-ray shows a lateral malleolus fracture with an intact mortise. He complains of 8/10. Constant pain even while taking 1-2 Percocet 5 mg tablets. Complains of constant and significant swelling with throbbing in the LEFT foot and ankle  ROS:(2) Review of systems heartburn. Skin edges at times, seasonal allergies. Otherwise, normal.  PFSH: (1)  Past Medical History  Diagnosis Date  . Bronchitis   . Acid reflux   . Pancreatitis   . Gout   . Bipolar disorder   . Hyperlipidemia   . Alcohol abuse   . Pseudocyst of pancreas 02/28/2012  . Arthritis      Physical Exam(12) GENERAL: normal development   CDV: pulses are normal   Skin: normal  Lymph: nodes were not palpable/normal  Psychiatric: awake, alert and oriented  Neuro: normal sensation  MSK The LEFT ankle is swollen and tender. It's hard to move his foot stability could not be assessed is no atrophy. The medial side is slightly tender, as well as the Achilles, but the Achilles is intact 1 Upper extremity exam  Inspection and palpation revealed no abnormalities in the upper extremities.  Range of motion is full without contracture.  Motor exam is normal with grade 5 strength.  The joints are fully reduced without subluxation.  There is no atrophy or tremor and muscle tone is normal.  All joints are stable.   Imaging: Hospital imaging reveals a lateral malleolus fracture with several fracture lines. The mortise and clear space appear to be normal  Assessment: Lateral malleolus fracture too much swelling. Patient's be placed in a splint    Plan: Advised nonweightbearing come back in a week out of cast at that  time

## 2012-04-19 ENCOUNTER — Emergency Department (HOSPITAL_COMMUNITY)
Admission: EM | Admit: 2012-04-19 | Discharge: 2012-04-19 | Disposition: A | Discharge status: Home / Self Care | Payer: Medicare Other | Attending: Emergency Medicine | Admitting: Emergency Medicine

## 2012-04-19 ENCOUNTER — Encounter (HOSPITAL_COMMUNITY): Payer: Self-pay | Admitting: *Deleted

## 2012-04-19 DIAGNOSIS — F17200 Nicotine dependence, unspecified, uncomplicated: Secondary | ICD-10-CM | POA: Insufficient documentation

## 2012-04-19 DIAGNOSIS — S82409A Unspecified fracture of shaft of unspecified fibula, initial encounter for closed fracture: Secondary | ICD-10-CM | POA: Insufficient documentation

## 2012-04-19 DIAGNOSIS — X58XXXA Exposure to other specified factors, initial encounter: Secondary | ICD-10-CM | POA: Insufficient documentation

## 2012-04-19 DIAGNOSIS — M7989 Other specified soft tissue disorders: Secondary | ICD-10-CM | POA: Insufficient documentation

## 2012-04-19 DIAGNOSIS — R062 Wheezing: Secondary | ICD-10-CM | POA: Insufficient documentation

## 2012-04-19 DIAGNOSIS — E785 Hyperlipidemia, unspecified: Secondary | ICD-10-CM | POA: Insufficient documentation

## 2012-04-19 DIAGNOSIS — K219 Gastro-esophageal reflux disease without esophagitis: Secondary | ICD-10-CM | POA: Insufficient documentation

## 2012-04-19 DIAGNOSIS — M25476 Effusion, unspecified foot: Secondary | ICD-10-CM | POA: Insufficient documentation

## 2012-04-19 DIAGNOSIS — M25473 Effusion, unspecified ankle: Secondary | ICD-10-CM | POA: Insufficient documentation

## 2012-04-19 DIAGNOSIS — F319 Bipolar disorder, unspecified: Secondary | ICD-10-CM | POA: Insufficient documentation

## 2012-04-19 DIAGNOSIS — IMO0001 Reserved for inherently not codable concepts without codable children: Secondary | ICD-10-CM | POA: Insufficient documentation

## 2012-04-19 DIAGNOSIS — R109 Unspecified abdominal pain: Secondary | ICD-10-CM | POA: Insufficient documentation

## 2012-04-19 MED ORDER — IBUPROFEN 800 MG PO TABS
800.0000 mg | ORAL_TABLET | Freq: Once | ORAL | Status: AC
  Administered 2012-04-19: 800 mg via ORAL
  Filled 2012-04-19: qty 1

## 2012-04-19 MED ORDER — HYDROMORPHONE HCL PF 1 MG/ML IJ SOLN
1.0000 mg | Freq: Once | INTRAMUSCULAR | Status: AC
  Administered 2012-04-19: 1 mg via INTRAMUSCULAR
  Filled 2012-04-19: qty 1

## 2012-04-19 MED ORDER — ONDANSETRON HCL 4 MG PO TABS
4.0000 mg | ORAL_TABLET | Freq: Once | ORAL | Status: AC
  Administered 2012-04-19: 4 mg via ORAL
  Filled 2012-04-19: qty 1

## 2012-04-19 NOTE — Discharge Instructions (Signed)
Please keep the left ankle elevated above the waist. Apply ice packs. Use crutches. Do not apply weight to the left leg. Please see Dr Romeo Apple for recheck of the leg in the office.Extremity Fracture Broken bones (fractures) take several weeks to months to heal depending on the bone involved. The broken ends must be lined up correctly and kept in position for proper healing. Do not remove the splint, immobilizer, or cast that has been applied to treat your injury. This is the most important part of your treatment. Other measures to treat fractures include:  Keeping the injured limb at rest and elevated above your heart as recommended by your caregiver. This will help reduce pain and swelling.   Ice packs can be applied to your fracture site for 20-30 minutes every 3-4 hours over the next 2-3 days.   Pain medicine may be prescribed in the first days after a fracture.  SEEK IMMEDIATE MEDICAL CARE IF:  You develop increasing pain or pressure in the injured limb, or if it becomes cold, numb, or pale.   There is increasing pain with motion of your fingers or toes.  Document Released: 01/16/2005 Document Revised: 11/28/2011 Document Reviewed: 03/29/2009 [facility] Patient Information 2012 [place], [place].

## 2012-04-19 NOTE — ED Notes (Signed)
Posterior splint to left lower leg. Has seen Dr. Romeo Apple. Cast to be applied tomorrow. States he took oxycodone 10/325 this morning with no relief

## 2012-04-19 NOTE — ED Notes (Signed)
Pt a/ox4. Resp even and unlabored. NAD at this time. D/c instructions reviewed with pt. Pt verbalized understanding. Pt to POV via w/c with wife to transport home.

## 2012-04-19 NOTE — ED Notes (Signed)
Pt presents with swelling to left ankle. Pt was diagnosed with fracture of left leg recently. Pt states he followed up with Dr. Romeo Apple and a cast was to applied tomorrow but pt reports swelling is even worse now. Pt states he took Percocet 10/325 at 0600 and again at 0900 this AM with no relief. Pulse palp. Skin that is exposed is intact. Splint is dirty on the bottom as if foot has been placed on the ground. Sensation to left foot intact. Swelling is present. Splint left intact until eval from PA. Will continue to monitor.

## 2012-04-19 NOTE — ED Provider Notes (Signed)
History     CSN: [id number]  Arrival date & time 04/19/12  1244   First MD Initiated Contact with Patient 04/19/12 1347      Chief Complaint  Patient presents with  . Leg Pain    (Consider location/radiation/quality/duration/timing/severity/associated sxs/prior treatment) HPI Comments: Pt sustained a fracture of the left fibula. He was seen in the ED, treated with splint, crutches and referred to  Orthopedics. He was seen by Dr Romeo Apple on Thursday 4/25 and was told they would try to apply the cast on 4/29. His foot remains swollen. He request evaluation an treatment of this problem.  Patient is a 51 y.o. male presenting with leg pain. The history is provided by the patient.  Leg Pain     Past Medical History  Diagnosis Date  . Bronchitis   . Acid reflux   . Pancreatitis   . Gout   . Bipolar disorder   . Hyperlipidemia   . Alcohol abuse   . Pseudocyst of pancreas 02/28/2012  . Arthritis     Past Surgical History  Procedure Date  . Nasal suregery     broken nose  . Circumcision 03/17/2012    Procedure: CIRCUMCISION ADULT;  Surgeon: Ky Barban, MD;  Location: [facility];  Service: Urology;  Laterality: N/A;  . Right leg     Family History  Problem Relation Age of Onset  . Diabetes Father   . Anesthesia problems Neg Hx   . Hypotension Neg Hx   . Malignant hyperthermia Neg Hx   . Pseudochol deficiency Neg Hx     History  Substance Use Topics  . Smoking status: Current Everyday Smoker -- 0.5 packs/day for 25 years    Types: Cigarettes  . Smokeless tobacco: Not on file  . Alcohol Use: 29.4 oz/week    35 Cans of beer, 14 Shots of liquor per week     Daily      Review of Systems  Constitutional: Negative for activity change.       All ROS Neg except as noted in HPI  HENT: Negative for nosebleeds and neck pain.   Eyes: Negative for photophobia and discharge.  Respiratory: Positive for wheezing. Negative for cough and shortness of breath.   Cardiovascular:  Negative for chest pain and palpitations.  Gastrointestinal: Positive for abdominal pain. Negative for blood in stool.  Genitourinary: Negative for dysuria, frequency and hematuria.  Musculoskeletal: Positive for myalgias, joint swelling and arthralgias. Negative for back pain.  Skin: Negative.   Neurological: Negative for dizziness, seizures and speech difficulty.  Psychiatric/Behavioral: Negative for hallucinations and confusion.    Allergies  Penicillins  Home Medications   Current Outpatient Rx  Name Route Sig Dispense Refill  . CITALOPRAM HYDROBROMIDE 20 MG PO TABS Oral Take 1 tablet (20 mg total) by mouth daily. 30 tablet 2  . FEXOFENADINE HCL 180 MG PO TABS Oral Take 1 tablet (180 mg total) by mouth daily. 30 tablet 2  . OMEGA-3 FATTY ACIDS 1000 MG PO CAPS Oral Take 1 g by mouth daily.    Marland Kitchen GEMFIBROZIL 600 MG PO TABS Oral Take 1 tablet (600 mg total) by mouth 2 (two) times daily before a meal. FOR TREATMENT OF YOUR HIGH TRIGLYCERIDE LEVEL. 60 tablet 3  . ADULT MULTIVITAMIN W/MINERALS CH Oral Take 1 tablet by mouth daily.    Marland Kitchen OMEPRAZOLE 20 MG PO CPDR Oral Take 1 capsule (20 mg total) by mouth daily. 30 capsule 1  . OXYCODONE-ACETAMINOPHEN 10-325 MG PO TABS  Oral Take 1 tablet by mouth every 4 (four) hours as needed for pain. 42 tablet 0  . TRAZODONE HCL 100 MG PO TABS Oral Take 100 mg by mouth daily as needed. Sleep      BP 128/88  Pulse 90  Temp(Src) 98.2 F (36.8 C) (Oral)  Resp 16  Ht 5\' 10"  (1.778 m)  Wt 186 lb (84.369 kg)  BMI 26.69 kg/m2  SpO2 100%  Physical Exam  Nursing note and vitals reviewed. Constitutional: He is oriented to person, place, and time. He appears well-developed and well-nourished.  Non-toxic appearance.  HENT:  Head: Normocephalic.  Right Ear: Tympanic membrane and external ear normal.  Left Ear: Tympanic membrane and external ear normal.  Eyes: EOM and lids are normal. Pupils are equal, round, and reactive to light.  Neck: Normal range of  motion. Neck supple. Carotid bruit is not present.  Cardiovascular: Normal rate, regular rhythm, normal heart sounds, intact distal pulses and normal pulses.   Pulmonary/Chest: Breath sounds normal. No respiratory distress.  Abdominal: Soft. Bowel sounds are normal. There is no tenderness. There is no guarding.  Musculoskeletal: Normal range of motion.       Posterior splint present. The left foot and ankle are swollen. Not hot. DP pulse wnl. No lesions between the toes.  Lymphadenopathy:       Head (right side): No submandibular adenopathy present.       Head (left side): No submandibular adenopathy present.    He has no cervical adenopathy.  Neurological: He is alert and oriented to person, place, and time. He has normal strength. No cranial nerve deficit or sensory deficit.  Skin: Skin is warm and dry.  Psychiatric: He has a normal mood and affect. His speech is normal.    ED Course  Procedures (including critical care time)  Labs Reviewed - No data to display No results found.   1. Fibula fracture       MDM  I have reviewed nursing notes, vital signs, and all appropriate lab and imaging results for this patient. Pt has been standing, and attempted to  Be in church for most of the morning today. He continues to have swelling. Pt strongly advised to keep the foot ankle elevated. Apply ice and use crutches in order to help the swelling to go down.       Kathie Dike, [place] 04/19/12 1437

## 2012-04-19 NOTE — ED Provider Notes (Signed)
Medical screening examination/treatment/procedure(s) were performed by non-physician practitioner and as supervising physician I was immediately available for consultation/collaboration.   Carleene Cooper III, MD 04/19/12 2110

## 2012-04-20 ENCOUNTER — Encounter: Payer: Self-pay | Admitting: Orthopedic Surgery

## 2012-04-20 ENCOUNTER — Ambulatory Visit (INDEPENDENT_AMBULATORY_CARE_PROVIDER_SITE_OTHER): Payer: Medicare Other | Admitting: Orthopedic Surgery

## 2012-04-20 VITALS — BP 120/80 | Ht 70.0 in | Wt 186.0 lb

## 2012-04-20 DIAGNOSIS — S82892A Other fracture of left lower leg, initial encounter for closed fracture: Secondary | ICD-10-CM

## 2012-04-20 DIAGNOSIS — S82899A Other fracture of unspecified lower leg, initial encounter for closed fracture: Secondary | ICD-10-CM

## 2012-04-20 MED ORDER — OXYCODONE-ACETAMINOPHEN 10-325 MG PO TABS: 1.0000 | ORAL_TABLET | ORAL | Status: DC | PRN

## 2012-04-20 NOTE — Progress Notes (Signed)
Patient ID: [name] [name], male   DOB: 1961-11-04, 51 y.o.   MRN: [id number] Chief Complaint  Patient presents with  . Follow-up    Recheck left ankle fracture and change from splint to a cast.     The patient's foot is still swollen, he went out Saturday and the swelling came back after he had gone down from elevation on Friday.  He is placed in a modified Jones dressing.  He informed me of his alcohol abuse/excessive Use./liver disease which explains why his pain medication doesn't work. He is now on 15 mg of Percocet instead of 10.

## 2012-04-20 NOTE — Patient Instructions (Signed)
Elevate the foot  

## 2012-04-21 ENCOUNTER — Ambulatory Visit: Payer: Medicare Other | Admitting: Orthopedic Surgery

## 2012-04-22 ENCOUNTER — Other Ambulatory Visit: Payer: Self-pay | Admitting: *Deleted

## 2012-04-22 DIAGNOSIS — S82892A Other fracture of left lower leg, initial encounter for closed fracture: Secondary | ICD-10-CM

## 2012-04-22 MED ORDER — OXYCODONE-ACETAMINOPHEN 10-325 MG PO TABS: 1.0000 | ORAL_TABLET | ORAL | Status: DC | PRN

## 2012-04-28 ENCOUNTER — Encounter: Payer: Self-pay | Admitting: Orthopedic Surgery

## 2012-04-28 ENCOUNTER — Ambulatory Visit (INDEPENDENT_AMBULATORY_CARE_PROVIDER_SITE_OTHER): Payer: Medicare Other | Admitting: Orthopedic Surgery

## 2012-04-28 ENCOUNTER — Ambulatory Visit (INDEPENDENT_AMBULATORY_CARE_PROVIDER_SITE_OTHER): Payer: Medicare Other

## 2012-04-28 VITALS — BP 124/88 | Ht 70.0 in | Wt 186.0 lb

## 2012-04-28 DIAGNOSIS — S82899A Other fracture of unspecified lower leg, initial encounter for closed fracture: Secondary | ICD-10-CM

## 2012-04-28 MED ORDER — OXYCODONE-ACETAMINOPHEN 10-325 MG PO TABS
ORAL_TABLET | ORAL | Status: DC
Start: 2012-04-28 — End: 2012-05-10

## 2012-04-28 NOTE — Progress Notes (Signed)
Patient ID: [name] [name], male   DOB: 11-21-1961, 51 y.o.   MRN: [id number] Chief Complaint  Patient presents with  . Follow-up    recheck and xray left ankle: 04-15-2012 DOI     The swelling has gone down significantly enough to put the cast on a short leg nonweightbearing cast was applied.  The clinic in 6 weeks for x-ray out of plaster.  Refilled Percocet 15 mg q.4-6 hours p.r.n. pain

## 2012-04-28 NOTE — Patient Instructions (Signed)
Cast instruction  Keep  Cast dry   Do not get wet   If it gets wet dry with a hair dryer on low setting and call the office

## 2012-05-07 ENCOUNTER — Other Ambulatory Visit: Payer: Self-pay | Admitting: Family Medicine

## 2012-05-08 ENCOUNTER — Telehealth: Payer: Self-pay | Admitting: Family Medicine

## 2012-05-08 NOTE — Telephone Encounter (Signed)
Med sent.

## 2012-05-10 ENCOUNTER — Encounter (HOSPITAL_COMMUNITY): Payer: Self-pay | Admitting: *Deleted

## 2012-05-10 ENCOUNTER — Emergency Department (HOSPITAL_COMMUNITY)
Admission: EM | Admit: 2012-05-10 | Discharge: 2012-05-10 | Disposition: A | Discharge status: Home / Self Care | Payer: Medicare Other | Attending: Emergency Medicine | Admitting: Emergency Medicine

## 2012-05-10 ENCOUNTER — Emergency Department (HOSPITAL_COMMUNITY): Payer: Medicare Other

## 2012-05-10 ENCOUNTER — Emergency Department (HOSPITAL_COMMUNITY)
Admission: EM | Admit: 2012-05-10 | Discharge: 2012-05-10 | Discharge status: Against Medical Advice | Payer: Medicare Other | Attending: Emergency Medicine | Admitting: Emergency Medicine

## 2012-05-10 DIAGNOSIS — K219 Gastro-esophageal reflux disease without esophagitis: Secondary | ICD-10-CM | POA: Insufficient documentation

## 2012-05-10 DIAGNOSIS — M109 Gout, unspecified: Secondary | ICD-10-CM | POA: Insufficient documentation

## 2012-05-10 DIAGNOSIS — S93409A Sprain of unspecified ligament of unspecified ankle, initial encounter: Secondary | ICD-10-CM | POA: Insufficient documentation

## 2012-05-10 DIAGNOSIS — F17200 Nicotine dependence, unspecified, uncomplicated: Secondary | ICD-10-CM | POA: Insufficient documentation

## 2012-05-10 DIAGNOSIS — M25469 Effusion, unspecified knee: Secondary | ICD-10-CM | POA: Insufficient documentation

## 2012-05-10 DIAGNOSIS — M25579 Pain in unspecified ankle and joints of unspecified foot: Secondary | ICD-10-CM | POA: Insufficient documentation

## 2012-05-10 DIAGNOSIS — Z79899 Other long term (current) drug therapy: Secondary | ICD-10-CM | POA: Insufficient documentation

## 2012-05-10 DIAGNOSIS — M25561 Pain in right knee: Secondary | ICD-10-CM

## 2012-05-10 DIAGNOSIS — S93401A Sprain of unspecified ligament of right ankle, initial encounter: Secondary | ICD-10-CM

## 2012-05-10 DIAGNOSIS — E785 Hyperlipidemia, unspecified: Secondary | ICD-10-CM | POA: Insufficient documentation

## 2012-05-10 DIAGNOSIS — IMO0002 Reserved for concepts with insufficient information to code with codable children: Secondary | ICD-10-CM | POA: Insufficient documentation

## 2012-05-10 DIAGNOSIS — F319 Bipolar disorder, unspecified: Secondary | ICD-10-CM | POA: Insufficient documentation

## 2012-05-10 DIAGNOSIS — M129 Arthropathy, unspecified: Secondary | ICD-10-CM | POA: Insufficient documentation

## 2012-05-10 DIAGNOSIS — M25569 Pain in unspecified knee: Secondary | ICD-10-CM

## 2012-05-10 DIAGNOSIS — W19XXXA Unspecified fall, initial encounter: Secondary | ICD-10-CM | POA: Insufficient documentation

## 2012-05-10 HISTORY — DX: Unspecified fracture of unspecified lower leg, initial encounter for closed fracture: S82.90XA

## 2012-05-10 NOTE — ED Provider Notes (Signed)
History     CSN: [id number]  Arrival date & time 05/10/12  1210   First MD Initiated Contact with Patient 05/10/12 1326      Chief Complaint  Patient presents with  . Knee Pain    (Consider location/radiation/quality/duration/timing/severity/associated sxs/prior treatment) HPI Comments: States he is seeing dr. Ophelia Charter for a worker's comp related R knee injury a month ago.  He is also seeing dr. Romeo Apple for a L fibula fx which is in a cast.  He was walking yest and "my R knee gave out".  Fell and injured R ankle.  Patient is a 51 y.o. male presenting with knee pain. The history is provided by the patient. No language interpreter was used.  Knee Pain This is a new problem. The current episode started yesterday. The problem occurs constantly. The problem has been unchanged. The symptoms are aggravated by walking and standing. He has tried oral narcotics for the symptoms. The treatment provided moderate relief.    Past Medical History  Diagnosis Date  . Bronchitis   . Acid reflux   . Pancreatitis   . Gout   . Bipolar disorder   . Hyperlipidemia   . Alcohol abuse   . Pseudocyst of pancreas 02/28/2012  . Arthritis   . Fracture of lower leg     Past Surgical History  Procedure Date  . Nasal suregery     broken nose  . Circumcision 03/17/2012    Procedure: CIRCUMCISION ADULT;  Surgeon: Ky Barban, MD;  Location: [facility];  Service: Urology;  Laterality: N/A;  . Right leg     Family History  Problem Relation Age of Onset  . Diabetes Father   . Anesthesia problems Neg Hx   . Hypotension Neg Hx   . Malignant hyperthermia Neg Hx   . Pseudochol deficiency Neg Hx     History  Substance Use Topics  . Smoking status: Current Everyday Smoker -- 0.5 packs/day for 25 years    Types: Cigarettes  . Smokeless tobacco: Not on file  . Alcohol Use: 29.4 oz/week    35 Cans of beer, 14 Shots of liquor per week     Daily      Review of Systems  Musculoskeletal:       R knee  and ankle injury  All other systems reviewed and are negative.    Allergies  Penicillins  Home Medications   Current Outpatient Rx  Name Route Sig Dispense Refill  . CITALOPRAM HYDROBROMIDE 20 MG PO TABS Oral Take 1 tablet (20 mg total) by mouth daily. 30 tablet 2  . FEXOFENADINE HCL 180 MG PO TABS Oral Take 1 tablet (180 mg total) by mouth daily. 30 tablet 2  . OMEGA-3 FATTY ACIDS 1000 MG PO CAPS Oral Take 1 g by mouth daily.    Marland Kitchen GEMFIBROZIL 600 MG PO TABS Oral Take 1 tablet (600 mg total) by mouth 2 (two) times daily before a meal. FOR TREATMENT OF YOUR HIGH TRIGLYCERIDE LEVEL. 60 tablet 3  . ADULT MULTIVITAMIN W/MINERALS CH Oral Take 1 tablet by mouth daily.    Marland Kitchen OMEPRAZOLE 20 MG PO CPDR Oral Take 20 mg by mouth daily.    . OXYCODONE-ACETAMINOPHEN 10-325 MG PO TABS Oral Take 1 tablet by mouth every 4 (four) hours as needed for pain. 60 tablet 0  . TRAZODONE HCL 100 MG PO TABS Oral Take 100 mg by mouth at bedtime as needed. Sleep      BP 131/96  Pulse 85  Temp 98 F (36.7 C)  Resp 17  SpO2 100%  Physical Exam  Nursing note and vitals reviewed. Constitutional: He is oriented to person, place, and time. He appears well-developed and well-nourished.  HENT:  Head: Normocephalic and atraumatic.  Eyes: EOM are normal.  Neck: Normal range of motion.  Cardiovascular: Normal rate, regular rhythm, normal heart sounds and intact distal pulses.   Pulmonary/Chest: Effort normal and breath sounds normal. No respiratory distress.  Abdominal: Soft. He exhibits no distension. There is no tenderness.  Musculoskeletal: Normal range of motion.       No visible R knee swelling.  + swelling and PT to R lateral malleolus.  Skin intact.  Neurological: He is alert and oriented to person, place, and time.  Skin: Skin is warm and dry.  Psychiatric: He has a normal mood and affect. Judgment normal.    ED Course  Procedures (including critical care time)  Labs Reviewed - No data to display Dg  Ankle Complete Right  05/10/2012  *RADIOLOGY REPORT*  Clinical Data: Ankle pain.  RIGHT ANKLE - COMPLETE 3+ VIEW  Comparison: No priors.  Findings: There is a large amount of soft tissue swelling overlying the lateral malleolus.  No evidence of underlying fracture, subluxation or dislocation.  IMPRESSION: 1.  Extensive soft tissue swelling overlying the ankle laterally, without evidence of underlying bony trauma.  Original Report Authenticated By: Florencia Reasons, M.D.   Dg Knee Complete 4 Views Right  05/10/2012  *RADIOLOGY REPORT*  Clinical Data: Knee pain and swelling.  RIGHT KNEE - COMPLETE 4+ VIEW  Comparison: 01/07/2012.  Findings: Four views of the right knee demonstrate no acute fracture, subluxation, dislocation, or joint abnormality.  There is a small suprapatellar knee joint effusion.  IMPRESSION: 1.  Small suprapatellar effusion.  No other evidence of acute traumatic injury to the right knee.  Original Report Authenticated By: Florencia Reasons, M.D.     1. Right ankle sprain   2. Right knee pain       MDM   take your pain med as prescribed.  Wear the knee immobilizer and ASO splint.  Use the crutches as needed and  Bear weight as tolerated.  F/u with drs harrison and yates.        Worthy Rancher, PA 05/10/12 [phone]

## 2012-05-10 NOTE — ED Provider Notes (Signed)
Medical screening examination/treatment/procedure(s) were performed by non-physician practitioner and as supervising physician I was immediately available for consultation/collaboration.   Dione Booze, MD 05/10/12 [phone]

## 2012-05-10 NOTE — ED Notes (Signed)
Pt states rt knee and ankle are painful after fall sustained yesterday. Reports rt knee "gave out and just fell". Pt reports will have knee surgery per workman's comp in the future by Dr Annell Greening.   Minimal swelling noted to rt ankle. Pt has noted abrasions noted to proximal posterior ankle and posterior knee. Bleeding controlled with scabbing present.  Pt states pain increases with weight bearing. Pt was seen walking into Ed with no limp or difficulty noted. Pt also has a short leg cast on left ankle, had plastic bag over cast. Bag removed and small breakdown off plaster noted on heel of cast.

## 2012-05-10 NOTE — ED Notes (Signed)
Pt called desk asking to use bathroom. Pt was offered assistance to the bathroom or use a urinal. Pt asked to use urinal. Pt was handed a urinal and door was closed for privacy.

## 2012-05-10 NOTE — Discharge Instructions (Signed)
Ankle Sprain An ankle sprain is an injury to the strong, fibrous tissues (ligaments) that hold the bones of your ankle joint together.  CAUSES Ankle sprain usually is caused by a fall or by twisting your ankle. People who participate in sports are more prone to these types of injuries.  SYMPTOMS  Symptoms of ankle sprain include:  Pain in your ankle. The pain may be present at rest or only when you are trying to stand or walk.   Swelling.   Bruising. Bruising may develop immediately or within 1 to 2 days after your injury.   Difficulty standing or walking.  DIAGNOSIS  Your caregiver will ask you details about your injury and perform a physical exam of your ankle to determine if you have an ankle sprain. During the physical exam, your caregiver will press and squeeze specific areas of your foot and ankle. Your caregiver will try to move your ankle in certain ways. An X-ray exam may be done to be sure a bone was not broken or a ligament did not separate from one of the bones in your ankle (avulsion).  TREATMENT  Certain types of braces can help stabilize your ankle. Your caregiver can make a recommendation for this. Your caregiver may recommend the use of medication for pain. If your sprain is severe, your caregiver may refer you to a surgeon who helps to restore function to parts of your skeletal system (orthopedist) or a physical therapist. HOME CARE INSTRUCTIONS  Apply ice to your injury for 1 to 2 days or as directed by your caregiver. Applying ice helps to reduce inflammation and pain.  Put ice in a plastic bag.   Place a towel between your skin and the bag.   Leave the ice on for 15 to 20 minutes at a time, every 2 hours while you are awake.   Take over-the-counter or prescription medicines for pain, discomfort, or fever only as directed by your caregiver.   Keep your injured leg elevated, when possible, to lessen swelling.   If your caregiver recommends crutches, use them as  instructed. Gradually, put weight on the affected ankle. Continue to use crutches or a cane until you can walk without feeling pain in your ankle.   If you have a plaster splint, wear the splint as directed by your caregiver. Do not rest it on anything harder than a pillow the first 24 hours. Do not put weight on it. Do not get it wet. You may take it off to take a shower or bath.   You may have been given an elastic bandage to wear around your ankle to provide support. If the elastic bandage is too tight (you have numbness or tingling in your foot or your foot becomes cold and blue), adjust the bandage to make it comfortable.   If you have an air splint, you may blow more air into it or let air out to make it more comfortable. You may take your splint off at night and before taking a shower or bath.   Wiggle your toes in the splint several times per day if you are able.  SEEK MEDICAL CARE IF:   You have an increase in bruising, swelling, or pain.   Your toes feel cold.   Pain relief is not achieved with medication.  SEEK IMMEDIATE MEDICAL CARE IF: Your toes are numb or blue or you have severe pain. MAKE SURE YOU:   Understand these instructions.   Will watch your condition.     Will get help right away if you are not doing well or get worse.  Document Released: 12/09/2005 Document Revised: 11/28/2011 Document Reviewed: 07/13/2008 [facility] Patient Information 2012 [place], [place].Crutch Use You have been prescribed crutches to take weight off one of your lower legs or feet (extremities). When using crutches, make sure you are not putting pressure on the armpit (axilla). This could cause damage to the nerves that extend from your axilla to the hand and arm. When fitted properly the crutches should be 2 to 3 finger widths below the axilla. Your weight should be supported by your hand, and not by resting upon the crutch with the axilla. When walking, first step with the crutches, then swing  the healthy leg through and slightly ahead. When going up stairs, first step up with the healthy leg and then follow with the crutches and injured leg up to the same step, and so forth. If there is a handrail, hold both crutches in one hand, place your other hand on the handrail, and while placing your weight on your arms, lift your good leg to the step, then bring the crutches and the injured leg up to that step. Repeat for each step. When going down stairs, first step with the injured leg and crutches, following down with the healthy leg to the same step. Be very careful, as going down stairs with crutches is very challenging. If you feel wobbly or nervous, sit down and inch yourself down the stairs on your butt. To get up from a chair, hold injured leg forward, grab armrest with one hand and the top of the crutches with the other hand. Using these supports, pull yourself up to a standing position. Reverse this procedure for sitting. See your caregiver for follow up as suggested. If you are discharged in an ace wrap and develop numbness, tingling, swelling, or increased pain, loosen the ace wrap and re-wrap looser. If these problems persist, see your caregiver as needed. If you have been instructed to use partial weight bearing, bear (apply) the amount of weight as suggested by your caregiver. Do not bear weight in an amount that causes pain on the area of injury. Document Released: 12/06/2000 Document Revised: 11/28/2011 Document Reviewed: 02/13/2009 [facility] Patient Information 2012 [place], [place].Cryotherapy Cryotherapy means treatment with cold. Ice or gel packs can be used to reduce both pain and swelling. Ice is the most helpful within the first 24 to 48 hours after an injury or flareup from overusing a muscle or joint. Sprains, strains, spasms, burning pain, shooting pain, and aches can all be eased with ice. Ice can also be used when recovering from surgery. Ice is effective, has very few side  effects, and is safe for most people to use. PRECAUTIONS  Ice is not a safe treatment option for people with:  Raynaud's phenomenon. This is a condition affecting small blood vessels in the extremities. Exposure to cold may cause your problems to return.   Cold hypersensitivity. There are many forms of cold hypersensitivity, including:   Cold urticaria. Red, itchy hives appear on the skin when the tissues begin to warm after being iced.   Cold erythema. This is a red, itchy rash caused by exposure to cold.   Cold hemoglobinuria. Red blood cells break down when the tissues begin to warm after being iced. The hemoglobin that carry oxygen are passed into the urine because they cannot combine with blood proteins fast enough.   Numbness or altered sensitivity in the area being iced.  If you have any of the following conditions, do not use ice until you have discussed cryotherapy with your caregiver:  Heart conditions, such as arrhythmia, angina, or chronic heart disease.   High blood pressure.   Healing wounds or open skin in the area being iced.   Current infections.   Rheumatoid arthritis.   Poor circulation.   Diabetes.  Ice slows the blood flow in the region it is applied. This is beneficial when trying to stop inflamed tissues from spreading irritating chemicals to surrounding tissues. However, if you expose your skin to cold temperatures for too long or without the proper protection, you can damage your skin or nerves. Watch for signs of skin damage due to cold. HOME CARE INSTRUCTIONS Follow these tips to use ice and cold packs safely.  Place a dry or damp towel between the ice and skin. A damp towel will cool the skin more quickly, so you may need to shorten the time that the ice is used.   For a more rapid response, add gentle compression to the ice.   Ice for no more than 10 to 20 minutes at a time. The bonier the area you are icing, the less time it will take to get the  benefits of ice.   Check your skin after 5 minutes to make sure there are no signs of a poor response to cold or skin damage.   Rest 20 minutes or more in between uses.   Once your skin is numb, you can end your treatment. You can test numbness by very lightly touching your skin. The touch should be so light that you do not see the skin dimple from the pressure of your fingertip. When using ice, most people will feel these normal sensations in this order: cold, burning, aching, and numbness.   Do not use ice on someone who cannot communicate their responses to pain, such as small children or people with dementia.  HOW TO MAKE AN ICE PACK Ice packs are the most common way to use ice therapy. Other methods include ice massage, ice baths, and cryo-sprays. Muscle creams that cause a cold, tingly feeling do not offer the same benefits that ice offers and should not be used as a substitute unless recommended by your caregiver. To make an ice pack, do one of the following:  Place crushed ice or a bag of frozen vegetables in a sealable plastic bag. Squeeze out the excess air. Place this bag inside another plastic bag. Slide the bag into a pillowcase or place a damp towel between your skin and the bag.   Mix 3 parts water with 1 part rubbing alcohol. Freeze the mixture in a sealable plastic bag. When you remove the mixture from the freezer, it will be slushy. Squeeze out the excess air. Place this bag inside another plastic bag. Slide the bag into a pillowcase or place a damp towel between your skin and the bag.  SEEK MEDICAL CARE IF:  You develop white spots on your skin. This may give the skin a blotchy (mottled) appearance.   Your skin turns blue or pale.   Your skin becomes waxy or hard.   Your swelling gets worse.  MAKE SURE YOU:   Understand these instructions.   Will watch your condition.   Will get help right away if you are not doing well or get worse.  Document Released: 08/05/2011  Document Revised: 11/28/2011 Document Reviewed: 08/05/2011 [facility] Patient Information 2012 [place], [place].  Take your pain medicine as prescribed.  Follow up with drs. Harrison and yates.

## 2012-05-10 NOTE — ED Notes (Signed)
Pt angry because he can't have pain medication or a drink at this time. Explained we were waiting on xray results.  Pt became angrier began swearing at nurse "I've had enough of this shit, I'm in pain that f---kin crap I took earlier didn't help". Pt got up (stood up without yelling ouch ) put sneaker on rt foot, and while standing (full weight bearing on rt foot/leg ) put bag over cast, walked to doorway into hall then turned back around and got in wheelchair and left the hospital.

## 2012-05-10 NOTE — ED Notes (Signed)
Heard Pt yelling "ouch"from his room with door closed, knocked on door and asked pt if he was ok. Pt responded his ankle hurt when he puts wait on it. Pt said he needed no help. Door was closed with pt sitting on the edge of bed.

## 2012-05-10 NOTE — ED Notes (Signed)
Ice pack given, gingerale given.

## 2012-05-10 NOTE — ED Notes (Signed)
Pt returns to department after being signed out LWBS due to being angry, pt requesting results of xrays

## 2012-05-10 NOTE — ED Notes (Signed)
Pt states that his knee "gave out" yesterday causing pt to fall, pt c/o pain to right knee and right ankle, cms intact distal,

## 2012-05-10 NOTE — ED Notes (Signed)
Pt states took "10mg /325 hydrocodone before coming to Ed,but they aren't any good". When asked what was stronger he stated " I need a shot of morphone or what ever it's called".

## 2012-05-11 ENCOUNTER — Ambulatory Visit (INDEPENDENT_AMBULATORY_CARE_PROVIDER_SITE_OTHER): Payer: Medicare Other | Admitting: Orthopedic Surgery

## 2012-05-11 ENCOUNTER — Encounter: Payer: Self-pay | Admitting: Orthopedic Surgery

## 2012-05-11 VITALS — BP 112/70 | Ht 70.0 in | Wt 186.0 lb

## 2012-05-11 DIAGNOSIS — M2351 Chronic instability of knee, right knee: Secondary | ICD-10-CM | POA: Insufficient documentation

## 2012-05-11 DIAGNOSIS — S93401A Sprain of unspecified ligament of right ankle, initial encounter: Secondary | ICD-10-CM

## 2012-05-11 DIAGNOSIS — S82899A Other fracture of unspecified lower leg, initial encounter for closed fracture: Secondary | ICD-10-CM

## 2012-05-11 DIAGNOSIS — M238X9 Other internal derangements of unspecified knee: Secondary | ICD-10-CM

## 2012-05-11 DIAGNOSIS — S93409A Sprain of unspecified ligament of unspecified ankle, initial encounter: Secondary | ICD-10-CM | POA: Insufficient documentation

## 2012-05-11 DIAGNOSIS — S82892A Other fracture of left lower leg, initial encounter for closed fracture: Secondary | ICD-10-CM

## 2012-05-11 NOTE — Patient Instructions (Signed)
No additional instructions. 

## 2012-05-11 NOTE — Progress Notes (Signed)
Subjective:     Patient ID: [name] [name], male   DOB: [birth year], 51 y.o.   MRN: [id number] Chief Complaint  Patient presents with  . Cast Problem    patient reports getting cast wet    HPI  Left ankle fracture treated with splinting for several weeks to get the swelling down followed by cast. Patient started weightbearing through his heel cast got wet came in to have a change. Since we saw him last he has had another giving out episode of his right knee and injured his right ankle. He went to the emergency room. X-ray showed no problem with his ankle. He was given an ASO brace he said it was too tight so we took off. He has a case pending regarding his right knee. He says he frequently gives out and causing him to injure his ankle today. Review of Systems Pertinent findings are in the history    Objective:   Physical Exam Examination of the left ankle for which we have been treating him for fracture shows decreased swelling skin intact he's placed in a Cam Walker  As far as his right lower extremity goes he is no joint effusion in the right knee. He has some swelling near the inferior portion of the tibial tubercle he has swelling over the medial aspect of his ankle with tenderness over the deltoid ligament. He has full range of motion of his knee with painful range of motion of his ankle. Both knee and ankle are stable although the ankle has a trace positive anterior drawer test. The cruciate ligaments of the knee and the collateral ligaments of the knee appears stable to stress test. Neurovascular exam is normal. Skin is intact.      Assessment:     #1 left ankle fracture with cast #2 right ankle sprain acute #3 right knee instability chronic    Plan:     Plan for #1 Place in a tall Cam Walker weightbearing as tolerated with crutches for the next 3 weeks and keep previous appointment as scheduled for x-ray Plan for #2 ASO brace for 6 weeks Plan for #3 the patient has a case pending I  asked him to get his MRI and the report for me to look at it. However, it is unlikely that he has surgical disease in the knee based on the exam today. MRI would be interesting to see exactly what was found

## 2012-05-13 NOTE — ED Provider Notes (Signed)
History     CSN: [id number]  Arrival date & time 05/10/12  1033   First MD Initiated Contact with Patient 05/10/12 1137      Chief Complaint  Patient presents with  . Knee Pain  . Ankle Pain    (Consider location/radiation/quality/duration/timing/severity/associated sxs/prior treatment) HPI  Past Medical History  Diagnosis Date  . Bronchitis   . Acid reflux   . Pancreatitis   . Gout   . Bipolar disorder   . Hyperlipidemia   . Alcohol abuse   . Pseudocyst of pancreas 02/28/2012  . Arthritis   . Fracture of lower leg     Past Surgical History  Procedure Date  . Nasal suregery     broken nose  . Circumcision 03/17/2012    Procedure: CIRCUMCISION ADULT;  Surgeon: Ky Barban, MD;  Location: [facility];  Service: Urology;  Laterality: N/A;  . Right leg     Family History  Problem Relation Age of Onset  . Diabetes Father   . Anesthesia problems Neg Hx   . Hypotension Neg Hx   . Malignant hyperthermia Neg Hx   . Pseudochol deficiency Neg Hx     History  Substance Use Topics  . Smoking status: Current Everyday Smoker -- 0.5 packs/day for 25 years    Types: Cigarettes  . Smokeless tobacco: Not on file  . Alcohol Use: 29.4 oz/week    35 Cans of beer, 14 Shots of liquor per week     Daily      Review of Systems  Allergies  Penicillins  Home Medications   Current Outpatient Rx  Name Route Sig Dispense Refill  . CITALOPRAM HYDROBROMIDE 20 MG PO TABS Oral Take 1 tablet (20 mg total) by mouth daily. 30 tablet 2  . FEXOFENADINE HCL 180 MG PO TABS Oral Take 1 tablet (180 mg total) by mouth daily. 30 tablet 2  . OMEGA-3 FATTY ACIDS 1000 MG PO CAPS Oral Take 1 g by mouth daily.    Marland Kitchen GEMFIBROZIL 600 MG PO TABS Oral Take 1 tablet (600 mg total) by mouth 2 (two) times daily before a meal. FOR TREATMENT OF YOUR HIGH TRIGLYCERIDE LEVEL. 60 tablet 3  . ADULT MULTIVITAMIN W/MINERALS CH Oral Take 1 tablet by mouth daily.    Marland Kitchen OMEPRAZOLE 20 MG PO CPDR Oral Take 20 mg by  mouth daily.    . OXYCODONE-ACETAMINOPHEN 10-325 MG PO TABS Oral Take 1 tablet by mouth every 4 (four) hours as needed for pain. 60 tablet 0  . TRAZODONE HCL 100 MG PO TABS Oral Take 100 mg by mouth at bedtime as needed. Sleep      BP 129/88  Pulse 90  Temp 97.7 F (36.5 C)  Resp 18  SpO2 100%  Physical Exam  ED Course  Procedures (including critical care time)  Labs Reviewed - No data to display No results found.   No diagnosis found.    MDM    Pt LWBS.   Not seen by me.      Worthy Rancher, PA 05/13/12 [phone]

## 2012-05-14 ENCOUNTER — Other Ambulatory Visit: Payer: Self-pay | Admitting: Orthopedic Surgery

## 2012-05-14 ENCOUNTER — Telehealth: Payer: Self-pay | Admitting: Orthopedic Surgery

## 2012-05-14 DIAGNOSIS — S82892A Other fracture of left lower leg, initial encounter for closed fracture: Secondary | ICD-10-CM

## 2012-05-14 MED ORDER — OXYCODONE-ACETAMINOPHEN 10-325 MG PO TABS: 1.0000 | ORAL_TABLET | ORAL | Status: DC | PRN

## 2012-05-14 NOTE — Telephone Encounter (Signed)
Patient has stopped in to office about his pain medication refill, states he was told to come by on Thursday morning.  Medication is:    oxyCODONE-acetaminophen (PERCOCET) 10-325 MG per tablet [[id number]]   Please advise.

## 2012-05-14 NOTE — ED Provider Notes (Signed)
Medical screening examination/treatment/procedure(s) were performed by non-physician practitioner and as supervising physician I was immediately available for consultation/collaboration.   Dione Booze, MD 05/14/12 1335

## 2012-05-21 ENCOUNTER — Telehealth: Payer: Self-pay | Admitting: Orthopedic Surgery

## 2012-05-21 NOTE — Telephone Encounter (Signed)
Patient called to follow up on the MRI report and medical records per [facility] in [place] with Dr. Laverle Hobby, [phone], fax# [phone].  The request was faxed 05/13/12. No response or records received as of yet.  Patient will contact their office to follow up.  The film (CD) is already received and on hold for review by Dr. Romeo Apple when reports received. Patient ph# is [phone] [facility]) and [phone] [facility]).

## 2012-05-25 NOTE — Telephone Encounter (Signed)
05/23/12 patient called back to follow up on record request; relayed that no records have been received from [facility], Dr. Laverle Hobby.  Patient will contact their office ot follow up with them directly.  Advised that Dr. Romeo Apple will need to review any records prior to scheduling an appointment for this medical problem.

## 2012-06-01 NOTE — Telephone Encounter (Signed)
Patient states he was sent a new release form for this report and medical records, and has returned it "last week, around 05/27/12."  His next scheduled appointment is 06/09/12 for re-check ankles, LT fracture, RT sprain.  He will bring to the appointment.  Film is already here.

## 2012-06-05 ENCOUNTER — Ambulatory Visit (INDEPENDENT_AMBULATORY_CARE_PROVIDER_SITE_OTHER): Payer: Medicare Other | Admitting: Family Medicine

## 2012-06-05 ENCOUNTER — Encounter: Payer: Self-pay | Admitting: Family Medicine

## 2012-06-05 VITALS — BP 132/98 | HR 79 | Resp 16 | Ht 70.0 in | Wt 185.0 lb

## 2012-06-05 DIAGNOSIS — K219 Gastro-esophageal reflux disease without esophagitis: Secondary | ICD-10-CM

## 2012-06-05 DIAGNOSIS — M109 Gout, unspecified: Secondary | ICD-10-CM

## 2012-06-05 DIAGNOSIS — F1010 Alcohol abuse, uncomplicated: Secondary | ICD-10-CM

## 2012-06-05 DIAGNOSIS — S93409A Sprain of unspecified ligament of unspecified ankle, initial encounter: Secondary | ICD-10-CM

## 2012-06-05 DIAGNOSIS — M25539 Pain in unspecified wrist: Secondary | ICD-10-CM

## 2012-06-05 DIAGNOSIS — E781 Pure hyperglyceridemia: Secondary | ICD-10-CM

## 2012-06-05 MED ORDER — NAPROXEN 500 MG PO TABS: 500.0000 mg | ORAL_TABLET | Freq: Two times a day (BID) | ORAL | Status: DC

## 2012-06-05 NOTE — Patient Instructions (Signed)
Get your labs done Take the anti-inflammatory medication twice day- take with food Get the labs done  Continue all other medications F/U 4 months

## 2012-06-05 NOTE — Progress Notes (Signed)
  Subjective:    Patient ID: [name] [name], male    DOB: [birth year], 51 y.o.   MRN: [id number]  HPI Patient here for he is also due for labs for his triglycerides. He tells me he is decreased his alcohol intake he now drinks sixpack of beer has not had any liquor. He denies any abdominal pain. He is concerned that his gout has flared in his ankle and wrist   Review of Systems  GEN- denies fatigue, fever, weight loss,weakness, recent illness CVS- denies chest pain, palpitations RESP- denies SOB, cough, wheeze ABD- denies N/V, change in stools, abd pain GU- denies dysuria, hematuria, dribbling, incontinence MSK- + joint pain, muscle aches, injury       Objective:   Physical Exam GEN- NAD, alert and oriented x3, repeat BP 138/94 HEENT- PERRL, EOMI, non injected sclera, pink conjunctiva, MMM, oropharynx clear CVS- RRR, no murmur RESP-CTAB ABD-NABS,soft, NT,ND EXT- Swelling right ankle, TTP at heel pad and insertion of plantar fascia as well as ball of foot, no swelling of great toe, bilateral wrist- + finklestein test, TTP along bilat thumb and DIP, no swelling noted Pulses- Radial, DP- 2+        Assessment & Plan:

## 2012-06-06 DIAGNOSIS — M25539 Pain in unspecified wrist: Secondary | ICD-10-CM | POA: Insufficient documentation

## 2012-06-06 LAB — LIPID PANEL
Cholesterol: 215 mg/dL — ABNORMAL HIGH (ref 0–200)
HDL: 71 mg/dL (ref >39)
Total CHOL/HDL Ratio: 3 Ratio
Triglycerides: 636 mg/dL — ABNORMAL HIGH (ref <150)

## 2012-06-06 LAB — URIC ACID: Uric Acid, Serum: 8.2 mg/dL — ABNORMAL HIGH (ref 4.0–7.8)

## 2012-06-06 NOTE — Assessment & Plan Note (Signed)
Right ankle sprain, advised to use ASO brace per ortho, NSAIDS given, he can f/u with ortho, no narcotics written

## 2012-06-06 NOTE — Assessment & Plan Note (Signed)
FLP to be done today

## 2012-06-06 NOTE — Assessment & Plan Note (Signed)
Doubt this is gout, with no swelling noted, or erythema- will check uric acid level Possible dequervains based on exam Will start him on anti-inflammatories

## 2012-06-06 NOTE — Assessment & Plan Note (Signed)
Unable to tell if pt truly has cut back his GF with him today states he continues to drink

## 2012-06-06 NOTE — Assessment & Plan Note (Signed)
Well-controlled on PPI. ?

## 2012-06-08 ENCOUNTER — Telehealth: Payer: Self-pay | Admitting: Family Medicine

## 2012-06-08 MED ORDER — COLCHICINE 0.6 MG PO TABS: 0.6000 mg | ORAL_TABLET | Freq: Every day | ORAL | Status: DC

## 2012-06-08 NOTE — Telephone Encounter (Signed)
I spoke with patient. He has not been taking the Lopid on a regular basis because it was causing some dry mouth. His triglycerides are now back into the 600s. He states he will restart the medicine as prescribed. He still having some pain in his wrist and his feet. His uric acid was elevated some therefore will start him on colchicine once a day for one week, because of some mild liver damage from his pancreatitis

## 2012-06-09 ENCOUNTER — Encounter: Payer: Self-pay | Admitting: Orthopedic Surgery

## 2012-06-09 ENCOUNTER — Ambulatory Visit (INDEPENDENT_AMBULATORY_CARE_PROVIDER_SITE_OTHER): Payer: Medicare Other | Admitting: Orthopedic Surgery

## 2012-06-09 ENCOUNTER — Ambulatory Visit (INDEPENDENT_AMBULATORY_CARE_PROVIDER_SITE_OTHER): Payer: Medicare Other

## 2012-06-09 VITALS — BP 120/80 | Ht 70.0 in | Wt 185.0 lb

## 2012-06-09 DIAGNOSIS — S82892A Other fracture of left lower leg, initial encounter for closed fracture: Secondary | ICD-10-CM

## 2012-06-09 DIAGNOSIS — S82899A Other fracture of unspecified lower leg, initial encounter for closed fracture: Secondary | ICD-10-CM

## 2012-06-09 MED ORDER — OXYCODONE-ACETAMINOPHEN 10-325 MG PO TABS: 1.0000 | ORAL_TABLET | ORAL | Status: DC | PRN

## 2012-06-09 NOTE — Patient Instructions (Addendum)
Continue cam walker

## 2012-06-09 NOTE — Progress Notes (Signed)
Patient ID: [name] [name], male   DOB: Dec 11, 1961, 51 y.o.   MRN: [id number] Chief Complaint  Patient presents with  . Follow-up    6 week recheck xray OOP Left ankle   BP 120/80  Ht 5\' 10"  (1.778 m)  Wt 185 lb (83.915 kg)  BMI 26.54 kg/m2  This is a fracture care followup visit  DOI: April 24  Diagnoses left ankle lat mall fracture   Treatment cast then cam walker   Current medications oxycodone   Today we will be taking x-rays.  Ankle looks fine mild tenderness at fracture site   xrays callous forming  alignment is normal

## 2012-06-16 ENCOUNTER — Other Ambulatory Visit: Payer: Self-pay | Admitting: Family Medicine

## 2012-06-22 ENCOUNTER — Encounter (HOSPITAL_COMMUNITY): Payer: Self-pay | Admitting: Emergency Medicine

## 2012-06-22 ENCOUNTER — Emergency Department (HOSPITAL_COMMUNITY)
Admission: EM | Admit: 2012-06-22 | Discharge: 2012-06-22 | Disposition: A | Discharge status: Home / Self Care | Payer: Medicare Other | Attending: Emergency Medicine | Admitting: Emergency Medicine

## 2012-06-22 DIAGNOSIS — K219 Gastro-esophageal reflux disease without esophagitis: Secondary | ICD-10-CM | POA: Insufficient documentation

## 2012-06-22 DIAGNOSIS — Z833 Family history of diabetes mellitus: Secondary | ICD-10-CM | POA: Insufficient documentation

## 2012-06-22 DIAGNOSIS — L089 Local infection of the skin and subcutaneous tissue, unspecified: Secondary | ICD-10-CM | POA: Insufficient documentation

## 2012-06-22 DIAGNOSIS — Z880 Allergy status to penicillin: Secondary | ICD-10-CM | POA: Insufficient documentation

## 2012-06-22 DIAGNOSIS — E785 Hyperlipidemia, unspecified: Secondary | ICD-10-CM | POA: Insufficient documentation

## 2012-06-22 DIAGNOSIS — M109 Gout, unspecified: Secondary | ICD-10-CM | POA: Insufficient documentation

## 2012-06-22 DIAGNOSIS — W57XXXA Bitten or stung by nonvenomous insect and other nonvenomous arthropods, initial encounter: Secondary | ICD-10-CM

## 2012-06-22 DIAGNOSIS — L0390 Cellulitis, unspecified: Secondary | ICD-10-CM

## 2012-06-22 DIAGNOSIS — F17200 Nicotine dependence, unspecified, uncomplicated: Secondary | ICD-10-CM | POA: Insufficient documentation

## 2012-06-22 DIAGNOSIS — M129 Arthropathy, unspecified: Secondary | ICD-10-CM | POA: Insufficient documentation

## 2012-06-22 DIAGNOSIS — Z8249 Family history of ischemic heart disease and other diseases of the circulatory system: Secondary | ICD-10-CM | POA: Insufficient documentation

## 2012-06-22 DIAGNOSIS — F319 Bipolar disorder, unspecified: Secondary | ICD-10-CM | POA: Insufficient documentation

## 2012-06-22 MED ORDER — CEPHALEXIN 500 MG PO CAPS
500.0000 mg | ORAL_CAPSULE | Freq: Once | ORAL | Status: AC
  Administered 2012-06-22: 500 mg via ORAL
  Filled 2012-06-22: qty 1

## 2012-06-22 MED ORDER — CEPHALEXIN 500 MG PO CAPS: 500.0000 mg | ORAL_CAPSULE | Freq: Four times a day (QID) | ORAL | Status: AC

## 2012-06-22 NOTE — Discharge Instructions (Signed)

## 2012-06-22 NOTE — ED Notes (Signed)
Pt c/o rash to rle x 1 week

## 2012-06-22 NOTE — ED Provider Notes (Signed)
History   This chart was scribed for Geoffery Lyons, MD by Sofie Rower. The patient was seen in room APA19/APA19 and the patient's care was started at 7:38 AM     CSN: [id number]  Arrival date & time 06/22/12  1914   None     Chief Complaint  Patient presents with  . Rash    (Consider location/radiation/quality/duration/timing/severity/associated sxs/prior treatment) HPI  Bexley Mclester is a 51 y.o. male who presents to the Emergency Department complaining of moderate, episodic rash located at the RLE onset one week ago with associated symptoms of itching, fever. The pt informs the EDP he thinks he may have gotten bit by something.   Pt has a hx of allergy to penicillin.   PCP is Dr. Jeanice Lim.    Past Medical History  Diagnosis Date  . Bronchitis   . Acid reflux   . Pancreatitis   . Gout   . Bipolar disorder   . Hyperlipidemia   . Alcohol abuse   . Pseudocyst of pancreas 02/28/2012  . Arthritis   . Fracture of lower leg     Past Surgical History  Procedure Date  . Nasal suregery     broken nose  . Circumcision 03/17/2012    Procedure: CIRCUMCISION ADULT;  Surgeon: Ky Barban, MD;  Location: [facility];  Service: Urology;  Laterality: N/A;  . Right leg     Family History  Problem Relation Age of Onset  . Diabetes Father   . Anesthesia problems Neg Hx   . Hypotension Neg Hx   . Malignant hyperthermia Neg Hx   . Pseudochol deficiency Neg Hx     History  Substance Use Topics  . Smoking status: Current Everyday Smoker -- 0.5 packs/day for 25 years    Types: Cigarettes  . Smokeless tobacco: Not on file  . Alcohol Use: 29.4 oz/week    35 Cans of beer, 14 Shots of liquor per week     Daily      Review of Systems  All other systems reviewed and are negative.    10 Systems reviewed and all are negative for acute change except as noted in the HPI.    Allergies  Penicillins  Home Medications   Current Outpatient Rx  Name Route Sig Dispense Refill  .  CITALOPRAM HYDROBROMIDE 20 MG PO TABS Oral Take 1 tablet (20 mg total) by mouth daily. 30 tablet 2  . COLCHICINE 0.6 MG PO TABS Oral Take 1 tablet (0.6 mg total) by mouth daily. 30 tablet 0  . FEXOFENADINE HCL 180 MG PO TABS  TAKE ONE TABLET BY MOUTH EVERY DAY 30 tablet 3  . OMEGA-3 FATTY ACIDS 1000 MG PO CAPS Oral Take 1 g by mouth daily.    Marland Kitchen GEMFIBROZIL 600 MG PO TABS Oral Take 1 tablet (600 mg total) by mouth 2 (two) times daily before a meal. FOR TREATMENT OF YOUR HIGH TRIGLYCERIDE LEVEL. 60 tablet 3  . ADULT MULTIVITAMIN W/MINERALS CH Oral Take 1 tablet by mouth daily.    Marland Kitchen NAPROXEN 500 MG PO TABS Oral Take 1 tablet (500 mg total) by mouth 2 (two) times daily with a meal. 60 tablet 1  . OMEPRAZOLE 20 MG PO CPDR Oral Take 20 mg by mouth daily.    . OXYCODONE-ACETAMINOPHEN 10-325 MG PO TABS Oral Take 1 tablet by mouth every 4 (four) hours as needed for pain. 90 tablet 0  . TRAZODONE HCL 100 MG PO TABS Oral Take 100 mg by mouth  at bedtime as needed. Sleep      BP 143/98  Pulse 93  Temp 98.4 F (36.9 C)  Resp 20  Ht 5\' 10"  (1.778 m)  Wt 187 lb (84.823 kg)  BMI 26.83 kg/m2  SpO2 97%  Physical Exam  Nursing note and vitals reviewed. Constitutional: He appears well-developed and well-nourished.  HENT:  Head: Atraumatic.  Right Ear: External ear normal.  Left Ear: External ear normal.  Nose: Nose normal.  Neck: Normal range of motion.  Musculoskeletal: Normal range of motion.  Neurological: He is alert.  Skin: Skin is warm and dry. Rash (RLE 4 cm X 6 cm erythematous, swollen, with a cental dark puncture. Slightly warm to the touch. ) noted.  Psychiatric: He has a normal mood and affect. His behavior is normal.    ED Course  Procedures (including critical care time)  DIAGNOSTIC STUDIES: Oxygen Saturation is 97% on room air, normal by my interpretation.    COORDINATION OF CARE:   7:42AM- EDP at bedside discusses treatment plan concerning antibiotics.   Labs Reviewed - No  data to display No results found.   1. Insect bite   2. Cellulitis       MDM  Will treat with keflex as this appears to be cellulitis.      I personally performed the services described in this documentation, which was scribed in my presence. The recorded information has been reviewed and considered.      Geoffery Lyons, MD 06/23/12 [phone]

## 2012-06-28 ENCOUNTER — Emergency Department (HOSPITAL_COMMUNITY)
Admission: EM | Admit: 2012-06-28 | Discharge: 2012-06-29 | Disposition: A | Discharge status: Home / Self Care | Payer: Medicare Other | Attending: Emergency Medicine | Admitting: Emergency Medicine

## 2012-06-28 ENCOUNTER — Encounter (HOSPITAL_COMMUNITY): Payer: Self-pay | Admitting: *Deleted

## 2012-06-28 DIAGNOSIS — M109 Gout, unspecified: Secondary | ICD-10-CM | POA: Insufficient documentation

## 2012-06-28 DIAGNOSIS — F319 Bipolar disorder, unspecified: Secondary | ICD-10-CM | POA: Insufficient documentation

## 2012-06-28 DIAGNOSIS — F17200 Nicotine dependence, unspecified, uncomplicated: Secondary | ICD-10-CM | POA: Insufficient documentation

## 2012-06-28 DIAGNOSIS — E785 Hyperlipidemia, unspecified: Secondary | ICD-10-CM | POA: Insufficient documentation

## 2012-06-28 DIAGNOSIS — M129 Arthropathy, unspecified: Secondary | ICD-10-CM | POA: Insufficient documentation

## 2012-06-28 DIAGNOSIS — W07XXXA Fall from chair, initial encounter: Secondary | ICD-10-CM | POA: Insufficient documentation

## 2012-06-28 DIAGNOSIS — K219 Gastro-esophageal reflux disease without esophagitis: Secondary | ICD-10-CM | POA: Insufficient documentation

## 2012-06-28 DIAGNOSIS — IMO0002 Reserved for concepts with insufficient information to code with codable children: Secondary | ICD-10-CM | POA: Insufficient documentation

## 2012-06-28 DIAGNOSIS — T148XXA Other injury of unspecified body region, initial encounter: Secondary | ICD-10-CM

## 2012-06-28 DIAGNOSIS — Z833 Family history of diabetes mellitus: Secondary | ICD-10-CM | POA: Insufficient documentation

## 2012-06-28 MED ORDER — CYCLOBENZAPRINE HCL 10 MG PO TABS: 10.0000 mg | ORAL_TABLET | Freq: Two times a day (BID) | ORAL | Status: AC | PRN

## 2012-06-28 MED ORDER — KETOROLAC TROMETHAMINE 60 MG/2ML IM SOLN
60.0000 mg | Freq: Once | INTRAMUSCULAR | Status: AC
  Administered 2012-06-29: 60 mg via INTRAMUSCULAR
  Filled 2012-06-28: qty 2

## 2012-06-28 MED ORDER — NAPROXEN 500 MG PO TABS: 500.0000 mg | ORAL_TABLET | Freq: Two times a day (BID) | ORAL | Status: DC

## 2012-06-28 NOTE — ED Provider Notes (Signed)
History  This chart was scribed for Vida Roller, MD by Bennett Scrape. This patient was seen in room APA03/APA03 and the patient's care was started at 11:46PM.  CSN: [id number]  Arrival date & time 06/28/12  2327   First MD Initiated Contact with Patient 06/28/12 2346      Chief Complaint  Patient presents with  . Hip Pain  . Headache    The history is provided by the patient. No language interpreter was used.    Throbbing sharp pain in left hip and left neck. States that he started feeling this pain several hours ago when he laid down to go to bed, it has been persistent, worse with range of motion of the hip in the neck, moderate. He has had no medication prior to arrival. He states that earlier in the day he was sitting in a chair in the yard.  the chair broke make him fall to the side. He denies any other injuries to head or neck.  Past Medical History  Diagnosis Date  . Bronchitis   . Acid reflux   . Pancreatitis   . Gout   . Bipolar disorder   . Hyperlipidemia   . Alcohol abuse   . Pseudocyst of pancreas 02/28/2012  . Arthritis   . Fracture of lower leg     Past Surgical History  Procedure Date  . Nasal suregery     broken nose  . Circumcision 03/17/2012    Procedure: CIRCUMCISION ADULT;  Surgeon: Ky Barban, MD;  Location: [facility];  Service: Urology;  Laterality: N/A;  . Right leg     Family History  Problem Relation Age of Onset  . Diabetes Father   . Anesthesia problems Neg Hx   . Hypotension Neg Hx   . Malignant hyperthermia Neg Hx   . Pseudochol deficiency Neg Hx     History  Substance Use Topics  . Smoking status: Current Everyday Smoker -- 0.5 packs/day for 25 years    Types: Cigarettes  . Smokeless tobacco: Not on file  . Alcohol Use: 29.4 oz/week    35 Cans of beer, 14 Shots of liquor per week     Daily      Review of Systems  HENT: Positive for neck pain.   Cardiovascular: Negative for chest pain and leg swelling.  Neurological:  Negative for headaches.    Allergies  Penicillins  Home Medications   Current Outpatient Rx  Name Route Sig Dispense Refill  . CEPHALEXIN 500 MG PO CAPS Oral Take 1 capsule (500 mg total) by mouth 4 (four) times daily. 30 capsule 0  . CITALOPRAM HYDROBROMIDE 20 MG PO TABS Oral Take 1 tablet (20 mg total) by mouth daily. 30 tablet 2  . COLCHICINE 0.6 MG PO TABS Oral Take 1 tablet (0.6 mg total) by mouth daily. 30 tablet 0  . CYCLOBENZAPRINE HCL 10 MG PO TABS Oral Take 1 tablet (10 mg total) by mouth 2 (two) times daily as needed for muscle spasms. 20 tablet 0  . FEXOFENADINE HCL 180 MG PO TABS  TAKE ONE TABLET BY MOUTH EVERY DAY 30 tablet 3  . OMEGA-3 FATTY ACIDS 1000 MG PO CAPS Oral Take 1 g by mouth daily.    Marland Kitchen GEMFIBROZIL 600 MG PO TABS Oral Take 1 tablet (600 mg total) by mouth 2 (two) times daily before a meal. FOR TREATMENT OF YOUR HIGH TRIGLYCERIDE LEVEL. 60 tablet 3  . ADULT MULTIVITAMIN W/MINERALS CH Oral Take 1 tablet  by mouth daily.    Marland Kitchen NAPROXEN 500 MG PO TABS Oral Take 1 tablet (500 mg total) by mouth 2 (two) times daily with a meal. 60 tablet 1  . NAPROXEN 500 MG PO TABS Oral Take 1 tablet (500 mg total) by mouth 2 (two) times daily with a meal. 30 tablet 0  . OMEPRAZOLE 20 MG PO CPDR Oral Take 20 mg by mouth daily.    . OXYCODONE-ACETAMINOPHEN 10-325 MG PO TABS Oral Take 1 tablet by mouth every 4 (four) hours as needed for pain. 90 tablet 0  . TRAZODONE HCL 100 MG PO TABS Oral Take 100 mg by mouth at bedtime as needed. Sleep      Triage Vitals: BP 126/95  Pulse 88  Temp 97.5 F (36.4 C)  Resp 20  Ht 5\' 10"  (1.778 m)  Wt 187 lb (84.823 kg)  BMI 26.83 kg/m2  SpO2 100%  Physical Exam  Nursing note and vitals reviewed. Constitutional: He is oriented to person, place, and time. He appears well-developed and well-nourished. No distress.  HENT:  Head: Normocephalic and atraumatic.  Eyes: EOM are normal.  Neck: Neck supple. No tracheal deviation present.        Tenderness over the soft tissues in the left lateral neck and trapezius muscle  Cardiovascular: Normal rate.   Pulmonary/Chest: Effort normal. No respiratory distress.  Musculoskeletal: Normal range of motion.       Pain with range of motion of the left hip which is minimal, pain with stretching of the gluteus muscle on the left  Neurological: He is alert and oriented to person, place, and time. Coordination normal.       gait antalgic secondary to left hip and neck and back pain, able to use both hands to sit up, normal grips, normal strength  Skin: Skin is warm and dry.  Psychiatric: He has a normal mood and affect. His behavior is normal.    ED Course  Procedures (including critical care time)  DIAGNOSTIC STUDIES: Oxygen Saturation is 100% on room air, normal by my interpretation.    COORDINATION OF CARE:    Labs Reviewed - No data to display No results found.   1. Muscle strain       MDM  Likely has muscle strain after fall, has no significant injuries requiring imaging, anti-inflammatories and muscle relaxer when necessary, followup with PMD. Vital signs unremarkable, no neurologic deficits.  I personally performed the services described in this documentation, which was scribed in my presence. The recorded information has been reviewed and considered.   Discharge Prescriptions include:  Naprosyn Flexeril         Vida Roller, MD 06/29/12 [phone]

## 2012-06-28 NOTE — ED Notes (Signed)
Pt c/o left hip pain that radiates "up to his head."

## 2012-06-29 NOTE — ED Notes (Signed)
Pt reporting pain in left hip radiating to head.  Pt somewhat lethargic and difficult to fully engage in conversation.  Dr. Hyacinth Meeker at bedside.

## 2012-06-30 ENCOUNTER — Encounter (HOSPITAL_COMMUNITY): Payer: Self-pay | Admitting: *Deleted

## 2012-06-30 ENCOUNTER — Emergency Department (HOSPITAL_COMMUNITY): Payer: Medicare Other

## 2012-06-30 ENCOUNTER — Emergency Department (HOSPITAL_COMMUNITY)
Admission: EM | Admit: 2012-06-30 | Discharge: 2012-06-30 | Disposition: A | Discharge status: Home / Self Care | Payer: Medicare Other | Attending: Emergency Medicine | Admitting: Emergency Medicine

## 2012-06-30 DIAGNOSIS — Z79899 Other long term (current) drug therapy: Secondary | ICD-10-CM | POA: Insufficient documentation

## 2012-06-30 DIAGNOSIS — E785 Hyperlipidemia, unspecified: Secondary | ICD-10-CM | POA: Insufficient documentation

## 2012-06-30 DIAGNOSIS — M129 Arthropathy, unspecified: Secondary | ICD-10-CM | POA: Insufficient documentation

## 2012-06-30 DIAGNOSIS — H53149 Visual discomfort, unspecified: Secondary | ICD-10-CM | POA: Insufficient documentation

## 2012-06-30 DIAGNOSIS — F319 Bipolar disorder, unspecified: Secondary | ICD-10-CM | POA: Insufficient documentation

## 2012-06-30 DIAGNOSIS — Z8639 Personal history of other endocrine, nutritional and metabolic disease: Secondary | ICD-10-CM | POA: Insufficient documentation

## 2012-06-30 DIAGNOSIS — K219 Gastro-esophageal reflux disease without esophagitis: Secondary | ICD-10-CM | POA: Insufficient documentation

## 2012-06-30 DIAGNOSIS — R51 Headache: Secondary | ICD-10-CM | POA: Insufficient documentation

## 2012-06-30 DIAGNOSIS — Z862 Personal history of diseases of the blood and blood-forming organs and certain disorders involving the immune mechanism: Secondary | ICD-10-CM | POA: Insufficient documentation

## 2012-06-30 MED ORDER — METHYLPREDNISOLONE SODIUM SUCC 125 MG IJ SOLR
125.0000 mg | Freq: Once | INTRAMUSCULAR | Status: DC
  Filled 2012-06-30: qty 2

## 2012-06-30 MED ORDER — METHYLPREDNISOLONE SODIUM SUCC 125 MG IJ SOLR
125.0000 mg | Freq: Once | INTRAMUSCULAR | Status: AC
  Administered 2012-06-30: 125 mg via INTRAMUSCULAR

## 2012-06-30 MED ORDER — ONDANSETRON 4 MG PO TBDP
4.0000 mg | ORAL_TABLET | Freq: Once | ORAL | Status: AC
  Administered 2012-06-30: 4 mg via ORAL
  Filled 2012-06-30: qty 1

## 2012-06-30 MED ORDER — OXYCODONE-ACETAMINOPHEN 5-325 MG PO TABS: 1.0000 | ORAL_TABLET | ORAL | Status: AC | PRN

## 2012-06-30 MED ORDER — DIPHENHYDRAMINE HCL 25 MG PO CAPS
50.0000 mg | ORAL_CAPSULE | Freq: Once | ORAL | Status: AC
  Administered 2012-06-30: 50 mg via ORAL
  Filled 2012-06-30: qty 2

## 2012-06-30 MED ORDER — HYDROMORPHONE HCL PF 2 MG/ML IJ SOLN
2.0000 mg | Freq: Once | INTRAMUSCULAR | Status: AC
  Administered 2012-06-30: 2 mg via INTRAMUSCULAR
  Filled 2012-06-30: qty 1

## 2012-06-30 NOTE — ED Provider Notes (Signed)
History     CSN: [id number]  Arrival date & time 06/30/12  1255   First MD Initiated Contact with Patient 06/30/12 1521      Chief Complaint  Patient presents with  . Headache    (Consider location/radiation/quality/duration/timing/severity/associated sxs/prior treatment) HPI Comments: Gradual onset yest.  No h/o headaches.  No trauma.  "never had a headache like this before".  No prior head CT's.  Patient is a 51 y.o. male presenting with headaches. The history is provided by the patient. No language interpreter was used.  Headache  This is a new problem. The current episode started yesterday. The problem occurs constantly. The problem has not changed since onset.The headache is associated with nothing. The pain is located in the left unilateral region. The pain is severe. The pain does not radiate. Pertinent negatives include no fever, no near-syncope, no nausea and no vomiting. He has tried resting in a darkened room (took percocet 10/325 this AM with no relief.) for the symptoms.    Past Medical History  Diagnosis Date  . Bronchitis   . Acid reflux   . Pancreatitis   . Gout   . Bipolar disorder   . Hyperlipidemia   . Alcohol abuse   . Pseudocyst of pancreas 02/28/2012  . Arthritis   . Fracture of lower leg     Past Surgical History  Procedure Date  . Nasal suregery     broken nose  . Circumcision 03/17/2012    Procedure: CIRCUMCISION ADULT;  Surgeon: Ky Barban, MD;  Location: [facility];  Service: Urology;  Laterality: N/A;  . Right leg     Family History  Problem Relation Age of Onset  . Diabetes Father   . Anesthesia problems Neg Hx   . Hypotension Neg Hx   . Malignant hyperthermia Neg Hx   . Pseudochol deficiency Neg Hx     History  Substance Use Topics  . Smoking status: Current Everyday Smoker -- 0.5 packs/day for 25 years    Types: Cigarettes  . Smokeless tobacco: Not on file  . Alcohol Use: 29.4 oz/week    35 Cans of beer, 14 Shots of liquor per  week     Daily      Review of Systems  Constitutional: Negative for fever and chills.  HENT: Negative for neck pain and neck stiffness.   Eyes: Positive for photophobia.  Cardiovascular: Negative for near-syncope.  Gastrointestinal: Negative for nausea and vomiting.  Neurological: Positive for headaches. Negative for dizziness, tremors, seizures, syncope, facial asymmetry, weakness and numbness.  All other systems reviewed and are negative.    Allergies  Penicillins  Home Medications   Current Outpatient Rx  Name Route Sig Dispense Refill  . CEPHALEXIN 500 MG PO CAPS Oral Take 1 capsule (500 mg total) by mouth 4 (four) times daily. 30 capsule 0  . CITALOPRAM HYDROBROMIDE 20 MG PO TABS Oral Take 1 tablet (20 mg total) by mouth daily. 30 tablet 2  . CYCLOBENZAPRINE HCL 10 MG PO TABS Oral Take 1 tablet (10 mg total) by mouth 2 (two) times daily as needed for muscle spasms. 20 tablet 0  . FEXOFENADINE HCL 180 MG PO TABS  TAKE ONE TABLET BY MOUTH EVERY DAY 30 tablet 3  . OMEGA-3 FATTY ACIDS 1000 MG PO CAPS Oral Take 1 g by mouth daily.    Marland Kitchen GEMFIBROZIL 600 MG PO TABS Oral Take 1 tablet (600 mg total) by mouth 2 (two) times daily before a meal. FOR TREATMENT  OF YOUR HIGH TRIGLYCERIDE LEVEL. 60 tablet 3  . ADULT MULTIVITAMIN W/MINERALS CH Oral Take 1 tablet by mouth daily.    Marland Kitchen OMEPRAZOLE 20 MG PO CPDR Oral Take 20 mg by mouth daily.    . OXYCODONE-ACETAMINOPHEN 10-325 MG PO TABS Oral Take 1 tablet by mouth every 4 (four) hours as needed for pain. 90 tablet 0  . TRAZODONE HCL 100 MG PO TABS Oral Take 100 mg by mouth at bedtime as needed. Sleep    . OXYCODONE-ACETAMINOPHEN 5-325 MG PO TABS Oral Take 1 tablet by mouth every 4 (four) hours as needed for pain. May take 2 tablets PO q 6 hours for severe pain - Do not take with Tylenol as this tablet already contains tylenol 10 tablet 0    BP 143/100  Pulse 75  Temp 97.9 F (36.6 C) (Oral)  Resp 18  Ht 5\' 10"  (1.778 m)  Wt 188 lb  (85.276 kg)  BMI 26.98 kg/m2  SpO2 100%  Physical Exam  Nursing note and vitals reviewed. Constitutional: He is oriented to person, place, and time. He appears well-developed and well-nourished.  HENT:  Head: Normocephalic and atraumatic.  Right Ear: External ear normal.  Left Ear: External ear normal.  Eyes: EOM are normal. Pupils are equal, round, and reactive to light.  Neck: Normal range of motion.  Cardiovascular: Normal rate, regular rhythm and intact distal pulses.   Pulmonary/Chest: Effort normal and breath sounds normal. No respiratory distress.  Abdominal: Soft. He exhibits no distension. There is no tenderness.  Musculoskeletal: Normal range of motion.  Lymphadenopathy:    He has no cervical adenopathy.  Neurological: He is alert and oriented to person, place, and time. He has normal strength. He displays no tremor. No cranial nerve deficit or sensory deficit. He exhibits normal muscle tone. He displays a negative Romberg sign. He displays no seizure activity. Coordination and gait normal. GCS eye subscore is 4. GCS verbal subscore is 5. GCS motor subscore is 6.  Skin: Skin is warm and dry.  Psychiatric: He has a normal mood and affect. Judgment normal.    ED Course  Procedures (including critical care time)  Labs Reviewed - No data to display Ct Head Wo Contrast  06/30/2012  *RADIOLOGY REPORT*  Clinical Data:  History of severe frontal headache.  Nausea and vomiting.  Sensitive to sound.  CT HEAD WITHOUT CONTRAST  Technique: Contiguous axial images were obtained from the base of the skull through the vertex without contrast  Comparison:  06/09/2009 study.  Findings:  There is no evidence of brain mass, brain hemorrhage, or acute infarction.  The ventricular system is normal size and shape.  There is no evidence of shift of midline structures, parenchymal lesion, or subdural or epidural hematoma.  The calvarium is intact.  Mastoids are well aerated.  No active sinusitis is  evident. Opacification of right ethmoid air cell may be congenital and is unchanged from prior study.  IMPRESSION: There is no evidence of brain mass, brain hemorrhage, or acute infarction.  No acute or active process is seen.  No skull lesion is evident. No acute sinusitis is evident.  Original Report Authenticated By: Crawford Givens, M.D.     1. Headache       MDM   no acute abnormalities on CT.  F/u with PCP        Evalina Field, PA 06/30/12 1712

## 2012-06-30 NOTE — ED Notes (Signed)
Pt comes back to triage and states that he does not have any n/v today, that it was all yesterday,

## 2012-06-30 NOTE — ED Notes (Signed)
Headache that started last pm, admits to n/v, sensitive to sound,

## 2012-07-01 ENCOUNTER — Encounter: Payer: Self-pay | Admitting: Family Medicine

## 2012-07-01 ENCOUNTER — Ambulatory Visit (INDEPENDENT_AMBULATORY_CARE_PROVIDER_SITE_OTHER): Payer: Medicare Other | Admitting: Family Medicine

## 2012-07-01 VITALS — BP 140/90 | HR 103 | Resp 16 | Ht 70.0 in | Wt 177.0 lb

## 2012-07-01 DIAGNOSIS — F1010 Alcohol abuse, uncomplicated: Secondary | ICD-10-CM

## 2012-07-01 DIAGNOSIS — Z720 Tobacco use: Secondary | ICD-10-CM

## 2012-07-01 DIAGNOSIS — I10 Essential (primary) hypertension: Secondary | ICD-10-CM

## 2012-07-01 DIAGNOSIS — F17200 Nicotine dependence, unspecified, uncomplicated: Secondary | ICD-10-CM

## 2012-07-01 DIAGNOSIS — R059 Cough, unspecified: Secondary | ICD-10-CM

## 2012-07-01 DIAGNOSIS — E781 Pure hyperglyceridemia: Secondary | ICD-10-CM

## 2012-07-01 DIAGNOSIS — R05 Cough: Secondary | ICD-10-CM

## 2012-07-01 DIAGNOSIS — L0291 Cutaneous abscess, unspecified: Secondary | ICD-10-CM

## 2012-07-01 DIAGNOSIS — L0390 Cellulitis, unspecified: Secondary | ICD-10-CM

## 2012-07-01 MED ORDER — HYDROCHLOROTHIAZIDE 25 MG PO TABS: 25.0000 mg | ORAL_TABLET | Freq: Every day | ORAL | Status: DC

## 2012-07-01 MED ORDER — GEMFIBROZIL 600 MG PO TABS: 600.0000 mg | ORAL_TABLET | Freq: Two times a day (BID) | ORAL | Status: DC

## 2012-07-01 NOTE — Assessment & Plan Note (Signed)
Unchanged

## 2012-07-01 NOTE — Patient Instructions (Addendum)
Continue the antibiotics Use the antibiotic cream twice a day for the next week  Start the blood pressure pill  Continue the fish oil  F/U 6 weeks for blood pressure recheck

## 2012-07-01 NOTE — Assessment & Plan Note (Signed)
Normal exam, normal oxygen sat, no med changes needed

## 2012-07-01 NOTE — Assessment & Plan Note (Signed)
Complete course of antibiotics, given triple antibiotic ointment to use topically no abscess seen, no current drainage

## 2012-07-01 NOTE — Assessment & Plan Note (Signed)
Elevated BP on multiple occasions start HCTZ 25mg  daily

## 2012-07-01 NOTE — Progress Notes (Signed)
  Subjective:    Patient ID: [name] [name], male    DOB: 1961/11/17, 51 y.o.   MRN: [id number]  HPI Pt seen in ED with headache yesterday, neg CT head, pain now resolved, also seen in ED 1 week ago with cellulitis s/p insect bite to right lower ext and started on keflex. Today he is concerned about the appearance of the rash, he has also had cough with mild production, ? Subjective fever, emesis x 1 on Monday. Blood pressure also noted to be elevated on multiple visits in ED He continues to drink ETOH  Review of Systems  GEN- denies fatigue, fever, weight loss,weakness, recent illness HEENT- denies eye drainage, change in vision, nasal discharge, CVS- denies chest pain, palpitations RESP- denies SOB, +cough, wheeze ABD- denies N/V, change in stools, abd pain GU- denies dysuria, hematuria, dribbling, incontinence MSK- +joint pain, muscle aches, injury Neuro- denies headache, dizziness, syncope, seizure activity      Objective:   Physical Exam GEN- NAD, alert and oriented x3, well appearing  HEENT- PERRL, EOMI, non injected sclera, pink conjunctiva, MMM, oropharynx clear CVS- RRR, no murmur RESP-CTAB ABD-NABS,soft,NT,ND EXT- No edema Pulses- Radial, DP- 2+ Skin- 4cm area of hyperpigmentation surrounding a scabbed lesion on right lower leg, no drainage no induration minimal erythema , non tender to palpation, small abrasion LLE        Assessment & Plan:

## 2012-07-01 NOTE — ED Provider Notes (Signed)
Medical screening examination/treatment/procedure(s) were performed by non-physician practitioner and as supervising physician I was immediately available for consultation/collaboration.  Berta Denson, MD 07/01/12 0857 

## 2012-07-01 NOTE — Assessment & Plan Note (Signed)
Discussed labs again, continue lopid fish oil three times a day, discussed etoh use

## 2012-07-04 ENCOUNTER — Emergency Department (HOSPITAL_COMMUNITY)
Admission: EM | Admit: 2012-07-04 | Discharge: 2012-07-04 | Disposition: A | Discharge status: Home / Self Care | Payer: Medicare Other | Attending: Emergency Medicine | Admitting: Emergency Medicine

## 2012-07-04 ENCOUNTER — Encounter (HOSPITAL_COMMUNITY): Payer: Self-pay

## 2012-07-04 DIAGNOSIS — F319 Bipolar disorder, unspecified: Secondary | ICD-10-CM | POA: Insufficient documentation

## 2012-07-04 DIAGNOSIS — Z79899 Other long term (current) drug therapy: Secondary | ICD-10-CM | POA: Insufficient documentation

## 2012-07-04 DIAGNOSIS — F17200 Nicotine dependence, unspecified, uncomplicated: Secondary | ICD-10-CM | POA: Insufficient documentation

## 2012-07-04 DIAGNOSIS — M129 Arthropathy, unspecified: Secondary | ICD-10-CM | POA: Insufficient documentation

## 2012-07-04 DIAGNOSIS — Z880 Allergy status to penicillin: Secondary | ICD-10-CM | POA: Insufficient documentation

## 2012-07-04 DIAGNOSIS — E785 Hyperlipidemia, unspecified: Secondary | ICD-10-CM | POA: Insufficient documentation

## 2012-07-04 DIAGNOSIS — R51 Headache: Secondary | ICD-10-CM | POA: Insufficient documentation

## 2012-07-04 DIAGNOSIS — K219 Gastro-esophageal reflux disease without esophagitis: Secondary | ICD-10-CM | POA: Insufficient documentation

## 2012-07-04 DIAGNOSIS — M109 Gout, unspecified: Secondary | ICD-10-CM | POA: Insufficient documentation

## 2012-07-04 MED ORDER — HYDROMORPHONE HCL PF 1 MG/ML IJ SOLN
1.0000 mg | Freq: Once | INTRAMUSCULAR | Status: AC
  Administered 2012-07-04: 1 mg via INTRAMUSCULAR
  Filled 2012-07-04: qty 1

## 2012-07-04 MED ORDER — PROMETHAZINE HCL 25 MG/ML IJ SOLN
25.0000 mg | Freq: Once | INTRAMUSCULAR | Status: AC
  Administered 2012-07-04: 25 mg via INTRAMUSCULAR
  Filled 2012-07-04: qty 1

## 2012-07-04 NOTE — ED Notes (Signed)
Pt reports having a headache today, denies any n/v.  No trouble with vision

## 2012-07-04 NOTE — ED Provider Notes (Signed)
History     CSN: [id number]  Arrival date & time 07/04/12  1645   First MD Initiated Contact with Patient 07/04/12 1707      Chief Complaint  Patient presents with  . Headache    (Consider location/radiation/quality/duration/timing/severity/associated sxs/prior treatment) HPI Comments: Patient reports intermittent frontal headache since this morning. He states the headache was gradual in onset. He states pain feels similar to previous headaches. He also reports some nausea.  Patient also states that he was seen by his primary care physician 3 days ago and has recently began taking blood pressure medication. He also reports increased stress recently. He denies any chest pain, shortness of breath, fever, neck stiffness or pain, or visual changes.  HE was also seen here on 06/30/12 for same and had a CT scan of his head at that time.    Patient is a 51 y.o. male presenting with headaches. The history is provided by the patient.  Headache  This is a recurrent problem. The current episode started 6 to 12 hours ago. Episode frequency: Intermittently. The problem has been gradually worsening. The headache is associated with emotional stress. The pain is located in the frontal region. The quality of the pain is described as throbbing and dull. The pain is moderate. The pain does not radiate. Pertinent negatives include no anorexia, no fever, no malaise/fatigue, no chest pressure, no near-syncope, no syncope, no shortness of breath, no nausea and no vomiting. He has tried oral narcotic analgesics for the symptoms. The treatment provided no relief.    Past Medical History  Diagnosis Date  . Bronchitis   . Acid reflux   . Pancreatitis   . Gout   . Bipolar disorder   . Hyperlipidemia   . Alcohol abuse   . Pseudocyst of pancreas 02/28/2012  . Arthritis   . Fracture of lower leg     Past Surgical History  Procedure Date  . Nasal suregery     broken nose  . Circumcision 03/17/2012    Procedure:  CIRCUMCISION ADULT;  Surgeon: Ky Barban, MD;  Location: [facility];  Service: Urology;  Laterality: N/A;  . Right leg     Family History  Problem Relation Age of Onset  . Diabetes Father   . Anesthesia problems Neg Hx   . Hypotension Neg Hx   . Malignant hyperthermia Neg Hx   . Pseudochol deficiency Neg Hx     History  Substance Use Topics  . Smoking status: Current Everyday Smoker -- 0.5 packs/day for 25 years    Types: Cigarettes  . Smokeless tobacco: Not on file  . Alcohol Use: 29.4 oz/week    35 Cans of beer, 14 Shots of liquor per week     Daily      Review of Systems  Constitutional: Negative for fever, malaise/fatigue, activity change and appetite change.  HENT: Negative for facial swelling, trouble swallowing, neck pain and neck stiffness.   Eyes: Positive for photophobia. Negative for pain and visual disturbance.  Respiratory: Negative for chest tightness and shortness of breath.   Cardiovascular: Negative for chest pain, syncope and near-syncope.  Gastrointestinal: Negative for nausea, vomiting and anorexia.  Genitourinary: Negative for difficulty urinating.  Skin: Negative for rash and wound.  Neurological: Positive for headaches. Negative for dizziness, facial asymmetry, speech difficulty, weakness and numbness.  Psychiatric/Behavioral: Negative for confusion and decreased concentration.  All other systems reviewed and are negative.    Allergies  Penicillins  Home Medications   Current Outpatient  Rx  Name Route Sig Dispense Refill  . CITALOPRAM HYDROBROMIDE 20 MG PO TABS Oral Take 1 tablet (20 mg total) by mouth daily. 30 tablet 2  . CYCLOBENZAPRINE HCL 10 MG PO TABS Oral Take 1 tablet (10 mg total) by mouth 2 (two) times daily as needed for muscle spasms. 20 tablet 0  . FEXOFENADINE HCL 180 MG PO TABS  TAKE ONE TABLET BY MOUTH EVERY DAY 30 tablet 3  . OMEGA-3 FATTY ACIDS 1000 MG PO CAPS Oral Take 1 g by mouth 3 (three) times daily.     Marland Kitchen GEMFIBROZIL  600 MG PO TABS Oral Take 1 tablet (600 mg total) by mouth 2 (two) times daily before a meal. FOR TREATMENT OF YOUR HIGH TRIGLYCERIDE LEVEL. 60 tablet 3  . HYDROCHLOROTHIAZIDE 25 MG PO TABS Oral Take 1 tablet (25 mg total) by mouth daily. 30 tablet 3  . ADULT MULTIVITAMIN W/MINERALS CH Oral Take 1 tablet by mouth daily.    Marland Kitchen OMEPRAZOLE 20 MG PO CPDR Oral Take 20 mg by mouth daily.    . OXYCODONE-ACETAMINOPHEN 10-325 MG PO TABS Oral Take 1 tablet by mouth every 4 (four) hours as needed for pain. 90 tablet 0  . OXYCODONE-ACETAMINOPHEN 5-325 MG PO TABS Oral Take 1 tablet by mouth every 4 (four) hours as needed for pain. May take 2 tablets PO q 6 hours for severe pain - Do not take with Tylenol as this tablet already contains tylenol 10 tablet 0  . TRAZODONE HCL 100 MG PO TABS Oral Take 100 mg by mouth at bedtime as needed. Sleep      BP 131/76  Pulse 88  Temp 98.4 F (36.9 C) (Oral)  Resp 20  Ht 5\' 10"  (1.778 m)  Wt 177 lb (80.287 kg)  BMI 25.40 kg/m2  SpO2 99%  Physical Exam  Nursing note and vitals reviewed. Constitutional: He is oriented to person, place, and time. He appears well-developed and well-nourished. No distress.  HENT:  Head: Normocephalic and atraumatic.  Mouth/Throat: Oropharynx is clear and moist.  Eyes: Conjunctivae and EOM are normal. Pupils are equal, round, and reactive to light.  Neck: Normal range of motion and phonation normal. Neck supple. No rigidity. No Brudzinski's sign and no Kernig's sign noted.  Cardiovascular: Normal rate, regular rhythm, normal heart sounds and intact distal pulses.   No murmur heard. Pulmonary/Chest: Effort normal and breath sounds normal.  Musculoskeletal: Normal range of motion. He exhibits no edema.  Neurological: He is alert and oriented to person, place, and time. No cranial nerve deficit or sensory deficit. He exhibits normal muscle tone. Coordination and gait normal.  Reflex Scores:      Tricep reflexes are 2+ on the right side  and 2+ on the left side.      Bicep reflexes are 2+ on the right side and 2+ on the left side. Skin: Skin is warm and dry.    ED Course  Procedures (including critical care time)  Labs Reviewed - No data to display      MDM    Previous ED charts and imaging were reviewed by me. Patient had a negative head CT on 06/30/2012.  Patient has been seen here several times previously for headaches. He was seen by his primary care physician, Dr.Hutchinson 3 days ago and recently started on antihypertensive medication.  He appears stable for discharge at this time. I advised patient to followup with his primary care physician if the headaches continue.   Vitals stable,  Pt is non-toxic appearing.  No focal neuro deficits, no meningeal signs. Ambulated to the restroom with a steady gait.   Headache of gradual onset that is similar to previous.  Patient agrees to close f/u with PMD or to return here if the symptoms worsen.  The patient appears reasonably screened and/or stabilized for discharge and I doubt any other medical condition or other [facility] requiring further screening, evaluation, or treatment in the ED at this time prior to discharge.   Kaley Jutras L. Clemmie Marxen, [place] 07/04/12 1750

## 2012-07-04 NOTE — ED Notes (Signed)
Pt presents with headache since awaking this morning.Refers to pain in forehead that has worsened throughout the day. Denies vomiting however has been nausea.  Pt states has some blurred vision but relates this to his blood pressure being high earlier.  Pt watching TV upon entering the room with no distress noted.

## 2012-07-04 NOTE — ED Notes (Signed)
Has taken pain pill at 12n and 30 min pta,  "took 5/325's"

## 2012-07-05 NOTE — ED Provider Notes (Signed)
Medical screening examination/treatment/procedure(s) were performed by non-physician practitioner and as supervising physician I was immediately available for consultation/collaboration. Andrius Andrepont, MD, FACEP   Areana Kosanke L Pattye Meda, MD 07/05/12 0033 

## 2012-07-26 ENCOUNTER — Encounter (HOSPITAL_COMMUNITY): Payer: Self-pay | Admitting: Emergency Medicine

## 2012-07-26 ENCOUNTER — Emergency Department (HOSPITAL_COMMUNITY)
Admission: EM | Admit: 2012-07-26 | Discharge: 2012-07-26 | Disposition: A | Discharge status: Home / Self Care | Payer: Medicare Other | Attending: Emergency Medicine | Admitting: Emergency Medicine

## 2012-07-26 DIAGNOSIS — F17200 Nicotine dependence, unspecified, uncomplicated: Secondary | ICD-10-CM | POA: Insufficient documentation

## 2012-07-26 DIAGNOSIS — M5430 Sciatica, unspecified side: Secondary | ICD-10-CM | POA: Insufficient documentation

## 2012-07-26 DIAGNOSIS — F1010 Alcohol abuse, uncomplicated: Secondary | ICD-10-CM | POA: Insufficient documentation

## 2012-07-26 DIAGNOSIS — F319 Bipolar disorder, unspecified: Secondary | ICD-10-CM | POA: Insufficient documentation

## 2012-07-26 DIAGNOSIS — K219 Gastro-esophageal reflux disease without esophagitis: Secondary | ICD-10-CM | POA: Insufficient documentation

## 2012-07-26 DIAGNOSIS — E785 Hyperlipidemia, unspecified: Secondary | ICD-10-CM | POA: Insufficient documentation

## 2012-07-26 DIAGNOSIS — Z880 Allergy status to penicillin: Secondary | ICD-10-CM | POA: Insufficient documentation

## 2012-07-26 DIAGNOSIS — M109 Gout, unspecified: Secondary | ICD-10-CM | POA: Insufficient documentation

## 2012-07-26 DIAGNOSIS — M5431 Sciatica, right side: Secondary | ICD-10-CM

## 2012-07-26 MED ORDER — KETOROLAC TROMETHAMINE 60 MG/2ML IM SOLN
60.0000 mg | Freq: Once | INTRAMUSCULAR | Status: AC
  Administered 2012-07-26: 60 mg via INTRAMUSCULAR
  Filled 2012-07-26: qty 2

## 2012-07-26 MED ORDER — CYCLOBENZAPRINE HCL 10 MG PO TABS
10.0000 mg | ORAL_TABLET | Freq: Once | ORAL | Status: AC
  Administered 2012-07-26: 10 mg via ORAL
  Filled 2012-07-26: qty 1

## 2012-07-26 MED ORDER — IBUPROFEN 800 MG PO TABS: 800.0000 mg | ORAL_TABLET | Freq: Three times a day (TID) | ORAL | Status: DC

## 2012-07-26 MED ORDER — CYCLOBENZAPRINE HCL 5 MG PO TABS: 5.0000 mg | ORAL_TABLET | Freq: Three times a day (TID) | ORAL | Status: DC | PRN

## 2012-07-26 NOTE — ED Notes (Signed)
Pt c/o lower back pain radiating down his right thigh for a few days. Denies injury. Pt alert and oriented x 3. Skin warm and dry. Color pink. Able to ambulate and move all extremities with no difficulty. No acute distress.

## 2012-07-26 NOTE — ED Provider Notes (Signed)
History     CSN: [id number]  Arrival date & time 07/26/12  1214   First MD Initiated Contact with Patient 07/26/12 1323      Chief Complaint  Patient presents with  . Back Pain    (Consider location/radiation/quality/duration/timing/severity/associated sxs/prior treatment) HPI Comments: Tashawn Laswell  presents with acute on chronic low back pain which has which has been present for the past day.   Patient denies any new injury specifically.  There is radiation into his left mid posterior thigh.  There has been no weakness or numbness in the lower extremities and no urinary or bowel retention or incontinence.  Patient does not have a history of cancer or IVDU.  He has taken a friends tramadol without relief of pain.    The history is provided by the patient.    Past Medical History  Diagnosis Date  . Bronchitis   . Acid reflux   . Pancreatitis   . Gout   . Bipolar disorder   . Hyperlipidemia   . Alcohol abuse   . Pseudocyst of pancreas 02/28/2012  . Arthritis   . Fracture of lower leg     Past Surgical History  Procedure Date  . Nasal suregery     broken nose  . Circumcision 03/17/2012    Procedure: CIRCUMCISION ADULT;  Surgeon: Ky Barban, MD;  Location: [facility];  Service: Urology;  Laterality: N/A;  . Right leg     Family History  Problem Relation Age of Onset  . Diabetes Father   . Anesthesia problems Neg Hx   . Hypotension Neg Hx   . Malignant hyperthermia Neg Hx   . Pseudochol deficiency Neg Hx     History  Substance Use Topics  . Smoking status: Current Everyday Smoker -- 0.5 packs/day for 25 years    Types: Cigarettes  . Smokeless tobacco: Not on file  . Alcohol Use: 29.4 oz/week    35 Cans of beer, 14 Shots of liquor per week     Daily      Review of Systems  Constitutional: Negative for fever.  Respiratory: Negative for shortness of breath.   Cardiovascular: Negative for chest pain and leg swelling.  Gastrointestinal: Negative for abdominal  pain, constipation and abdominal distention.  Genitourinary: Negative for dysuria, urgency, frequency, flank pain and difficulty urinating.  Musculoskeletal: Positive for back pain. Negative for joint swelling and gait problem.  Skin: Negative for rash.  Neurological: Negative for weakness and numbness.    Allergies  Penicillins  Home Medications   Current Outpatient Rx  Name Route Sig Dispense Refill  . CITALOPRAM HYDROBROMIDE 20 MG PO TABS Oral Take 1 tablet (20 mg total) by mouth daily. 30 tablet 2  . CYCLOBENZAPRINE HCL 10 MG PO TABS Oral Take 10 mg by mouth 2 (two) times daily as needed. For muscle spasms    . FEXOFENADINE HCL 180 MG PO TABS  TAKE ONE TABLET BY MOUTH EVERY DAY 30 tablet 3  . OMEGA-3 FATTY ACIDS 1000 MG PO CAPS Oral Take 1 g by mouth 3 (three) times daily.     Marland Kitchen GEMFIBROZIL 600 MG PO TABS Oral Take 1 tablet (600 mg total) by mouth 2 (two) times daily before a meal. FOR TREATMENT OF YOUR HIGH TRIGLYCERIDE LEVEL. 60 tablet 3  . HYDROCHLOROTHIAZIDE 25 MG PO TABS Oral Take 1 tablet (25 mg total) by mouth daily. 30 tablet 3  . ADULT MULTIVITAMIN W/MINERALS CH Oral Take 1 tablet by mouth daily.    Marland Kitchen  OMEPRAZOLE 20 MG PO CPDR Oral Take 20 mg by mouth daily.    . TRAZODONE HCL 100 MG PO TABS Oral Take 100 mg by mouth at bedtime as needed. Sleep    . CYCLOBENZAPRINE HCL 5 MG PO TABS Oral Take 1 tablet (5 mg total) by mouth 3 (three) times daily as needed for muscle spasms. 15 tablet 0  . IBUPROFEN 800 MG PO TABS Oral Take 1 tablet (800 mg total) by mouth 3 (three) times daily. 21 tablet 0    BP 138/100  Pulse 81  Temp 98.2 F (36.8 C) (Oral)  Resp 22  Ht 5' 10.5" (1.791 m)  Wt 188 lb (85.276 kg)  BMI 26.59 kg/m2  SpO2 100%  Physical Exam  Nursing note and vitals reviewed. Constitutional: He appears well-developed and well-nourished.  HENT:  Head: Normocephalic.  Eyes: Conjunctivae are normal.  Neck: Normal range of motion. Neck supple.  Cardiovascular: Normal  rate and intact distal pulses.        Pedal pulses normal.  Pulmonary/Chest: Effort normal.  Abdominal: Soft. Bowel sounds are normal. He exhibits no distension and no mass.  Musculoskeletal: Normal range of motion. He exhibits no edema.       Lumbar back: He exhibits tenderness. He exhibits no swelling, no edema and no spasm.       Paralumbar ttp.  Neurological: He is alert. He has normal strength. He displays no atrophy and no tremor. No sensory deficit. Gait normal.  Reflex Scores:      Patellar reflexes are 2+ on the right side and 2+ on the left side.      Achilles reflexes are 2+ on the right side and 2+ on the left side.      No strength deficit noted in hip and knee flexor and extensor muscle groups.  Ankle flexion and extension intact.  Skin: Skin is warm and dry.  Psychiatric: He has a normal mood and affect.    ED Course  Procedures (including critical care time)  Labs Reviewed - No data to display No results found.   1. Sciatica of right side     Pt given toradol injection,  Flexeril po prior to dc home.  MDM  Acute on chronic low back pain/sciatica with no physical exam findings suggestive of emergent or surgical condition.  Flexeril and ibuprofen prescribed.  Encouraged rest,  Heat therapy.  Recheck by pcp if  Not improving over the next 5 days.        Burgess Amor, [place] 07/26/12 2209

## 2012-07-26 NOTE — ED Notes (Signed)
States pain started to r leg yesterday and now is radiating up  Into back. Nad.

## 2012-07-28 ENCOUNTER — Ambulatory Visit: Payer: Medicare Other | Admitting: Orthopedic Surgery

## 2012-07-28 ENCOUNTER — Other Ambulatory Visit: Payer: Self-pay | Admitting: Family Medicine

## 2012-07-28 ENCOUNTER — Telehealth: Payer: Self-pay | Admitting: Family Medicine

## 2012-07-28 NOTE — Telephone Encounter (Signed)
Already have been sent in

## 2012-07-30 ENCOUNTER — Ambulatory Visit: Payer: Medicare Other | Admitting: Orthopedic Surgery

## 2012-07-30 ENCOUNTER — Telehealth: Payer: Self-pay | Admitting: Orthopedic Surgery

## 2012-07-30 NOTE — Telephone Encounter (Signed)
[name] [name] will you call the patient

## 2012-07-30 NOTE — ED Provider Notes (Signed)
Medical screening examination/treatment/procedure(s) were performed by non-physician practitioner and as supervising physician I was immediately available for consultation/collaboration.   Joya Gaskins, MD 07/30/12 [phone]

## 2012-07-30 NOTE — Telephone Encounter (Signed)
We can call him in some  Hydrocodone 5 mg  q4 prn pain # 40 NO REFILLS

## 2012-07-30 NOTE — Telephone Encounter (Signed)
[name] [name] came too late for his appointment today.  I rescheduled him for 08/18/12 (he is also on wait list).  He asked if you will write him a new Percocet prescription.  If so, will call him to pick up

## 2012-07-31 ENCOUNTER — Ambulatory Visit (INDEPENDENT_AMBULATORY_CARE_PROVIDER_SITE_OTHER): Payer: Medicare Other | Admitting: Family Medicine

## 2012-07-31 ENCOUNTER — Encounter: Payer: Self-pay | Admitting: Family Medicine

## 2012-07-31 ENCOUNTER — Other Ambulatory Visit: Payer: Self-pay | Admitting: *Deleted

## 2012-07-31 VITALS — BP 136/98 | HR 101 | Resp 16 | Ht 70.0 in | Wt 183.4 lb

## 2012-07-31 DIAGNOSIS — I10 Essential (primary) hypertension: Secondary | ICD-10-CM

## 2012-07-31 DIAGNOSIS — F1010 Alcohol abuse, uncomplicated: Secondary | ICD-10-CM

## 2012-07-31 DIAGNOSIS — M549 Dorsalgia, unspecified: Secondary | ICD-10-CM

## 2012-07-31 DIAGNOSIS — M5430 Sciatica, unspecified side: Secondary | ICD-10-CM

## 2012-07-31 MED ORDER — CYCLOBENZAPRINE HCL 10 MG PO TABS: 10.0000 mg | ORAL_TABLET | Freq: Three times a day (TID) | ORAL | Status: DC | PRN

## 2012-07-31 MED ORDER — KETOROLAC TROMETHAMINE 60 MG/2ML IM SOLN
60.0000 mg | Freq: Once | INTRAMUSCULAR | Status: AC
  Administered 2012-07-31: 60 mg via INTRAMUSCULAR

## 2012-07-31 MED ORDER — HYDROCODONE-ACETAMINOPHEN 5-325 MG PO TABS: 1.0000 | ORAL_TABLET | ORAL | Status: AC | PRN

## 2012-07-31 NOTE — Telephone Encounter (Signed)
Med sent, called patient, left voicemail 

## 2012-07-31 NOTE — Patient Instructions (Signed)
Flexeril increased to 10mg  three times a day  Continue Blood pressure medication Shot given for back pain Call the substance abuse numbers listed  Change f/u appt to 2 months

## 2012-07-31 NOTE — Telephone Encounter (Signed)
Med sent.

## 2012-07-31 NOTE — Progress Notes (Signed)
  Subjective:    Patient ID: [name] [name], male    DOB: 04-14-1961, 51 y.o.   MRN: [id number]  HPI  Pt presents with acute on chronic back pain, for the past 4 days, seen in ER 3 days ago, given flexeril 5mg  and ibuprofen. In ED given shot of toradol which he states helped the most. He denies any particular injury   Review of Systems   GEN- denies fatigue, fever, weight loss,weakness, recent illness HEENT- denies eye drainage, change in vision, nasal discharge, CVS- denies chest pain, palpitations RESP- denies SOB, cough, wheeze ABD- denies N/V, change in stools, abd pain GU- denies dysuria, hematuria, dribbling, incontinence MSK- denies joint pain, muscle aches, injury Neuro- denies headache, dizziness, syncope, seizure activity      Objective:   Physical Exam GEN- NAD, alert and oriented x3 HEENT- PERRL, EOMI, non injected sclera, pink conjunctiva, MMM, oropharynx clear CVS- RRR, no murmur RESP-CTAB Back- TTP left paraspinals, spine non tender, neg SLR,  Hip- normal IR.ER EXT- No edema Pulses- Radial, DP- 2+        Assessment & Plan:

## 2012-08-02 NOTE — Assessment & Plan Note (Addendum)
Continue BP meds, diastolic elevated some

## 2012-08-02 NOTE — Assessment & Plan Note (Signed)
Unchanged, given handout with detox- outpatient resources

## 2012-08-02 NOTE — Assessment & Plan Note (Signed)
Acute on chronic pain, given toradol injection, increase flexeril dose. Exam reassuring

## 2012-08-06 ENCOUNTER — Ambulatory Visit (INDEPENDENT_AMBULATORY_CARE_PROVIDER_SITE_OTHER): Payer: Medicare Other | Admitting: Family Medicine

## 2012-08-06 ENCOUNTER — Ambulatory Visit (HOSPITAL_COMMUNITY)
Admission: RE | Admit: 2012-08-06 | Discharge: 2012-08-06 | Disposition: A | Discharge status: Home / Self Care | Payer: Medicare Other | Source: Ambulatory Visit | Attending: Family Medicine | Admitting: Family Medicine

## 2012-08-06 ENCOUNTER — Encounter: Payer: Self-pay | Admitting: Family Medicine

## 2012-08-06 VITALS — BP 134/72 | HR 106 | Resp 18 | Ht 70.0 in | Wt 185.1 lb

## 2012-08-06 DIAGNOSIS — M545 Low back pain, unspecified: Secondary | ICD-10-CM | POA: Insufficient documentation

## 2012-08-06 DIAGNOSIS — M549 Dorsalgia, unspecified: Secondary | ICD-10-CM

## 2012-08-06 DIAGNOSIS — M25559 Pain in unspecified hip: Secondary | ICD-10-CM | POA: Insufficient documentation

## 2012-08-06 DIAGNOSIS — I10 Essential (primary) hypertension: Secondary | ICD-10-CM

## 2012-08-06 DIAGNOSIS — E781 Pure hyperglyceridemia: Secondary | ICD-10-CM

## 2012-08-06 DIAGNOSIS — M51379 Other intervertebral disc degeneration, lumbosacral region without mention of lumbar back pain or lower extremity pain: Secondary | ICD-10-CM | POA: Insufficient documentation

## 2012-08-06 DIAGNOSIS — Z79899 Other long term (current) drug therapy: Secondary | ICD-10-CM

## 2012-08-06 DIAGNOSIS — F1010 Alcohol abuse, uncomplicated: Secondary | ICD-10-CM

## 2012-08-06 DIAGNOSIS — M5137 Other intervertebral disc degeneration, lumbosacral region: Secondary | ICD-10-CM | POA: Insufficient documentation

## 2012-08-06 MED ORDER — METHYLPREDNISOLONE ACETATE 40 MG/ML IJ SUSP
40.0000 mg | Freq: Once | INTRAMUSCULAR | Status: AC
  Administered 2012-08-06: 40 mg via INTRAMUSCULAR

## 2012-08-06 MED ORDER — OXYCODONE-ACETAMINOPHEN 5-325 MG PO TABS: 1.0000 | ORAL_TABLET | Freq: Three times a day (TID) | ORAL | Status: DC | PRN

## 2012-08-06 NOTE — Patient Instructions (Addendum)
Pain Contract today Get xrays of Back /hip  Steroid injection today  Continue flexeril  Get the labs done fasting   Cancel Appt on 21st  F/U 2 months

## 2012-08-06 NOTE — Progress Notes (Signed)
  Subjective:    Patient ID: [name] [name], male    DOB: [birth year], 51 y.o.   MRN: [id number]  HPI  Patient presents with worsening back pain. He was seen last week after an ER visit for acute on chronic back pain. He's been taking Flexeril, ibuprofen and will recently given hydrocodone for his ankle injury by his orthopedic surgeon however these have not helped the pain. He is becoming very stiff when was unable to get out of bed a few days this week. He has pain with walking and feels pain radiating from his left lower back down his buttocks into his leg. He also admits to some tingling in his feet he denies change in bowel or bladder  Review of Systems   GEN- denies fatigue, fever, weight loss,weakness, recent illness CVS- denies chest pain, palpitations RESP- denies SOB, cough, wheeze ABD- denies N/V, change in stools, abd pain GU- denies dysuria, hematuria, dribbling, incontinence MSK- + joint pain, muscle aches, injury       Objective:   Physical Exam  GEN- NAD, alert and oriented x3 HEENT- PERRL, EOMI, non injected sclera, pink conjunctiva, MMM, oropharynx clear CVS- RRR, no murmur RESP-CTAB Back- TTP left paraspinals, spine non tender, neg SLR,  Hip- normal IR.ER EXT- No edema Pulses- Radial, DP- 2+      Assessment & Plan:

## 2012-08-07 LAB — DRUG SCREEN, URINE
Amphetamine Screen, Ur: NEGATIVE
Barbiturate Quant, Ur: NEGATIVE
Benzodiazepines.: NEGATIVE
Cocaine Metabolites: NEGATIVE
Creatinine,U: 27.34 mg/dL
Marijuana Metabolite: NEGATIVE
Methadone: NEGATIVE
Opiates: POSITIVE — AB
Phencyclidine (PCP): NEGATIVE
Propoxyphene: NEGATIVE

## 2012-08-08 NOTE — Assessment & Plan Note (Signed)
He has not called any detox support groups but has been cutting back ETOH per girlfriend and pt because of needed pain control, and understands he is not able to mix ETOH with his narcotic medications. Will monitor closely

## 2012-08-08 NOTE — Assessment & Plan Note (Signed)
Worsening back pain, given depo-medrol in office, placed on pain contract, percocet to be started after he completes vicodin given by orthopedic surgeon. Plain films obtained which show DDD and arthritis, with radicular symptoms which are new will obtain MRI of back.

## 2012-08-08 NOTE — Assessment & Plan Note (Signed)
BP much improved today, no change in meds

## 2012-08-08 NOTE — Assessment & Plan Note (Signed)
Pt on lopid and fish oil, recheck TG, last check elevated to 600

## 2012-08-12 ENCOUNTER — Ambulatory Visit: Payer: Medicare Other | Admitting: Family Medicine

## 2012-08-13 ENCOUNTER — Telehealth: Payer: Self-pay | Admitting: Family Medicine

## 2012-08-13 LAB — LIPID PANEL
Cholesterol: 274 mg/dL — ABNORMAL HIGH (ref 0–200)
HDL: 106 mg/dL (ref >39)
LDL Cholesterol: 139 mg/dL — ABNORMAL HIGH (ref 0–99)
Total CHOL/HDL Ratio: 2.6 Ratio
Triglycerides: 147 mg/dL (ref <150)
VLDL: 29 mg/dL (ref 0–40)

## 2012-08-14 ENCOUNTER — Ambulatory Visit (HOSPITAL_COMMUNITY)
Admission: RE | Admit: 2012-08-14 | Discharge: 2012-08-14 | Disposition: A | Discharge status: Home / Self Care | Payer: Medicare Other | Source: Ambulatory Visit | Attending: Family Medicine | Admitting: Family Medicine

## 2012-08-14 DIAGNOSIS — M5126 Other intervertebral disc displacement, lumbar region: Secondary | ICD-10-CM | POA: Insufficient documentation

## 2012-08-14 DIAGNOSIS — M545 Low back pain, unspecified: Secondary | ICD-10-CM | POA: Insufficient documentation

## 2012-08-14 DIAGNOSIS — M5137 Other intervertebral disc degeneration, lumbosacral region: Secondary | ICD-10-CM | POA: Insufficient documentation

## 2012-08-14 DIAGNOSIS — M51379 Other intervertebral disc degeneration, lumbosacral region without mention of lumbar back pain or lower extremity pain: Secondary | ICD-10-CM | POA: Insufficient documentation

## 2012-08-14 DIAGNOSIS — M549 Dorsalgia, unspecified: Secondary | ICD-10-CM

## 2012-08-14 NOTE — Telephone Encounter (Signed)
Patient is aware 

## 2012-08-17 ENCOUNTER — Other Ambulatory Visit: Payer: Self-pay | Admitting: Family Medicine

## 2012-08-17 DIAGNOSIS — M48061 Spinal stenosis, lumbar region without neurogenic claudication: Secondary | ICD-10-CM

## 2012-08-17 DIAGNOSIS — IMO0002 Reserved for concepts with insufficient information to code with codable children: Secondary | ICD-10-CM

## 2012-08-17 DIAGNOSIS — M549 Dorsalgia, unspecified: Secondary | ICD-10-CM

## 2012-08-18 ENCOUNTER — Ambulatory Visit (INDEPENDENT_AMBULATORY_CARE_PROVIDER_SITE_OTHER): Payer: Medicare Other | Admitting: Family Medicine

## 2012-08-18 ENCOUNTER — Encounter: Payer: Self-pay | Admitting: Orthopedic Surgery

## 2012-08-18 ENCOUNTER — Other Ambulatory Visit (HOSPITAL_COMMUNITY): Payer: Medicare Other

## 2012-08-18 ENCOUNTER — Ambulatory Visit (INDEPENDENT_AMBULATORY_CARE_PROVIDER_SITE_OTHER): Payer: Medicare Other | Admitting: Orthopedic Surgery

## 2012-08-18 ENCOUNTER — Emergency Department (HOSPITAL_COMMUNITY)
Admission: EM | Admit: 2012-08-18 | Discharge: 2012-08-18 | Disposition: A | Discharge status: Home / Self Care | Payer: Medicare Other | Attending: Emergency Medicine | Admitting: Emergency Medicine

## 2012-08-18 ENCOUNTER — Encounter: Payer: Self-pay | Admitting: Family Medicine

## 2012-08-18 ENCOUNTER — Ambulatory Visit (INDEPENDENT_AMBULATORY_CARE_PROVIDER_SITE_OTHER): Payer: Medicare Other

## 2012-08-18 ENCOUNTER — Encounter (HOSPITAL_COMMUNITY): Payer: Self-pay | Admitting: *Deleted

## 2012-08-18 VITALS — BP 122/84 | Ht 70.0 in | Wt 187.0 lb

## 2012-08-18 VITALS — BP 142/80 | HR 93 | Resp 18 | Ht 70.0 in | Wt 184.0 lb

## 2012-08-18 DIAGNOSIS — S82892A Other fracture of left lower leg, initial encounter for closed fracture: Secondary | ICD-10-CM

## 2012-08-18 DIAGNOSIS — M549 Dorsalgia, unspecified: Secondary | ICD-10-CM | POA: Insufficient documentation

## 2012-08-18 DIAGNOSIS — R3911 Hesitancy of micturition: Secondary | ICD-10-CM

## 2012-08-18 DIAGNOSIS — E781 Pure hyperglyceridemia: Secondary | ICD-10-CM

## 2012-08-18 DIAGNOSIS — S82899A Other fracture of unspecified lower leg, initial encounter for closed fracture: Secondary | ICD-10-CM

## 2012-08-18 DIAGNOSIS — F1010 Alcohol abuse, uncomplicated: Secondary | ICD-10-CM

## 2012-08-18 LAB — POCT URINALYSIS DIPSTICK
Bilirubin, UA: NEGATIVE
Blood, UA: NEGATIVE
Glucose, UA: NEGATIVE
Ketones, UA: NEGATIVE
Leukocytes, UA: NEGATIVE
Nitrite, UA: NEGATIVE
Protein, UA: NEGATIVE
Spec Grav, UA: 1.02
Urobilinogen, UA: 0.2
pH, UA: 6

## 2012-08-18 MED ORDER — TAMSULOSIN HCL 0.4 MG PO CAPS: 0.4000 mg | ORAL_CAPSULE | Freq: Every day | ORAL | Status: DC

## 2012-08-18 MED ORDER — KETOROLAC TROMETHAMINE 60 MG/2ML IJ SOLN
60.0000 mg | Freq: Once | INTRAMUSCULAR | Status: AC
  Administered 2012-08-18: 60 mg via INTRAMUSCULAR

## 2012-08-18 MED ORDER — METHYLPREDNISOLONE 4 MG PO KIT: PACK | ORAL | Status: DC

## 2012-08-18 MED ORDER — HYDROCODONE-ACETAMINOPHEN 7.5-325 MG PO TABS: 1.0000 | ORAL_TABLET | Freq: Four times a day (QID) | ORAL | Status: AC | PRN

## 2012-08-18 NOTE — ED Notes (Addendum)
Pt was met by this RN as he was walking up the hall. Pt stated that he was leaving he was not waiting anymore stated " I have been here since 12 and no one has seen me, I will just go home and die!"Pt signed in at 0120 and triaged and placed in room at that time.  Informed pt once again that he was next to be seen that I would talk with MD, Pt stated that he was not waiting. Pt was ambulatory with steady gait. Pt did have hand placed on L  lower back; however, no signs of distress noted. MD informed of event. Informed pt that he was leaving against medical advice

## 2012-08-18 NOTE — ED Notes (Signed)
Offered Pt's daughter  soda and crackers Dgt accepted, Informed pt that he couldn't drink till seen by MD for assessment. Pt verbalized understanding. Offered other comfort measures, ie blanket lights dimmed for comfort

## 2012-08-18 NOTE — ED Notes (Signed)
Pt reporting pain in lower back, radiating down left leg.  Reports having MRI done on Friday.  States he has an appointment with physician tomorrow, may receive referral for specialist.

## 2012-08-18 NOTE — ED Provider Notes (Signed)
I did not evaluate the patient emergency department.  When I went into his room there is nobody there.  I spoke with the nurse it sounds as though the patient's left the emergency department prior to being seen by a medical provider but after triage.  I was not given a chance to perform a history or physical.  I was not informed to discuss leaving the emergency department without evaluation.  Lyanne Co, MD 08/18/12 [phone]

## 2012-08-18 NOTE — Progress Notes (Signed)
  Subjective:    Patient ID: [name] [name], male    DOB: 15-Jul-1961, 51 y.o.   MRN: [id number]  HPI Patient here to followup back pain. He's had worsening back pain after he tried to scrub his entire back from last night. Of note he presented to the emergency room however after staying there for 3 hours he got frustrated and left before being seen by physician. He's asking for shot of Toradol today. He's taken his hydrocodone. MRI revealed some spinal stenosis as well as degenerative disc disease and disc bulge. He also tells me today that for the past few weeks he has had urinary hesticany. He stands at the toilet for a few minutes before he is able to urinate   Review of Systems  GEN- denies fatigue, fever, weight loss,weakness, recent illness HEENT- denies eye drainage, change in vision, nasal discharge, CVS- denies chest pain, palpitations RESP- denies SOB, cough, wheeze ABD- denies N/V, change in stools, abd pain GU- denies dysuria, hematuria, dribbling, incontinence MSK- +joint pain, muscle aches, injury Neuro- denies headache, dizziness, syncope, seizure activity      Objective:   Physical Exam GEN-NAD, alert and oriented x 3 GI- Soft,NT,ND, No CVA tenderness Back- TTP lumbar spine, antalgic gait Neuro- no focal deficits, sensation in tact, motor equal bilat      Assessment & Plan:

## 2012-08-18 NOTE — Patient Instructions (Addendum)
Remove brace   activities as tolerated  Decrease amount of pain medication you are taking

## 2012-08-18 NOTE — Patient Instructions (Addendum)
Take the steroid dosepak  Continue the pain medication Start the flomax for your prostate  Keep previous appointment

## 2012-08-18 NOTE — Progress Notes (Signed)
Patient ID: [name] [name], male   DOB: October 08, 1961, 51 y.o.   MRN: [id number] Chief Complaint  Patient presents with  . Follow-up    6 week recheck and xray Left ankle fx, DOI 04/15/12    BP 122/84  Ht 5\' 10"  (1.778 m)  Wt 187 lb (84.823 kg)  BMI 26.83 kg/m2  This is the 4 month followup status post lateral malleolus fracture treated with cast followed by bracing  The patient still complains of pain around his ankle. He also complains of swelling.  His x-ray shows fibrous union of the fibular fracture with an intact mortise  His clinical exam shows that he does have some swelling over the bone minimal tenderness. He's regaining optional range of motion in the ankle joint  Recommend ASO brace followup as needed.  Refilled hydrocodone for the last time

## 2012-08-19 DIAGNOSIS — R3911 Hesitancy of micturition: Secondary | ICD-10-CM | POA: Insufficient documentation

## 2012-08-19 NOTE — Assessment & Plan Note (Signed)
Recent MRI with spinal stenosis, DDD, Disc bulge, no change in exam, given Toradol injection, referral already made to neurosurgeon. Add medrol dosepak. Continue hydrocodone. Discussed reasons to go to ER. He is to avoid a lot of bending or stooping or heavy lifting.

## 2012-08-19 NOTE — Assessment & Plan Note (Signed)
He has recently stated AA classes

## 2012-08-19 NOTE — Assessment & Plan Note (Signed)
He is giving symptoms of BPH, no change in neurological exam regarding back. UA negative for infection Trial of flomax , prostate exam deferred

## 2012-08-19 NOTE — Assessment & Plan Note (Signed)
Lipids into normal range, continue lopid, he has cut back on his ETOH

## 2012-08-19 NOTE — Assessment & Plan Note (Deleted)
He is giving symptoms of BPH, no change in neurological exam regarding back. UA negative for infection Trial of flomax , prostate exam deferred 

## 2012-08-21 ENCOUNTER — Encounter (HOSPITAL_COMMUNITY): Payer: Self-pay | Admitting: *Deleted

## 2012-08-21 ENCOUNTER — Emergency Department (HOSPITAL_COMMUNITY)
Admission: EM | Admit: 2012-08-21 | Discharge: 2012-08-21 | Disposition: A | Discharge status: Home / Self Care | Payer: Medicare Other | Attending: Emergency Medicine | Admitting: Emergency Medicine

## 2012-08-21 DIAGNOSIS — M129 Arthropathy, unspecified: Secondary | ICD-10-CM | POA: Insufficient documentation

## 2012-08-21 DIAGNOSIS — F17200 Nicotine dependence, unspecified, uncomplicated: Secondary | ICD-10-CM | POA: Insufficient documentation

## 2012-08-21 DIAGNOSIS — M109 Gout, unspecified: Secondary | ICD-10-CM | POA: Insufficient documentation

## 2012-08-21 DIAGNOSIS — F319 Bipolar disorder, unspecified: Secondary | ICD-10-CM | POA: Insufficient documentation

## 2012-08-21 DIAGNOSIS — E785 Hyperlipidemia, unspecified: Secondary | ICD-10-CM | POA: Insufficient documentation

## 2012-08-21 DIAGNOSIS — K219 Gastro-esophageal reflux disease without esophagitis: Secondary | ICD-10-CM | POA: Insufficient documentation

## 2012-08-21 DIAGNOSIS — M549 Dorsalgia, unspecified: Secondary | ICD-10-CM

## 2012-08-21 MED ORDER — OXYCODONE-ACETAMINOPHEN 5-325 MG PO TABS: 1.0000 | ORAL_TABLET | Freq: Four times a day (QID) | ORAL | Status: AC | PRN

## 2012-08-21 MED ORDER — HYDROMORPHONE HCL PF 1 MG/ML IJ SOLN
1.0000 mg | Freq: Once | INTRAMUSCULAR | Status: AC
  Administered 2012-08-21: 1 mg via INTRAMUSCULAR
  Filled 2012-08-21: qty 1

## 2012-08-21 NOTE — ED Provider Notes (Signed)
History  This chart was scribed for [name] Lennert, MD by Ladona Ridgel Day. This patient was seen in room [facility]06[facility] and the patient's care was started at 1503.   CSN: [id number]  Arrival date & time 08/21/12  1503   First MD Initiated Contact with Patient 08/21/12 1529      Chief Complaint  Patient presents with  . Back Pain   Patient is a 51 y.o. male presenting with back pain. The history is provided by the patient. No language interpreter was used.  Back Pain  This is a chronic problem. The current episode started more than 1 week ago. The problem occurs constantly. The problem has not changed since onset.The pain is associated with no known injury. The pain is present in the lumbar spine. The quality of the pain is described as aching. The pain does not radiate. The pain is moderate. The symptoms are aggravated by certain positions. The pain is the same all the time. Pertinent negatives include no chest pain, no fever, no headaches and no abdominal pain.   Furqan Gosselin is a 51 y.o. male who presents to the Emergency Department with chronic lower back pain complaining of constant left lower back pain and out of his prescribed pain medicines. His Rx will be refilled Sunday and he states he cannot make it through the weekend with his back pain. Dr. Jeanice Lim prescribes his pain medicine. He denies any other injuries/illnesses.  Past Medical History  Diagnosis Date  . Bronchitis   . Acid reflux   . Pancreatitis   . Gout   . Bipolar disorder   . Hyperlipidemia   . Alcohol abuse   . Pseudocyst of pancreas 02/28/2012  . Arthritis   . Fracture of lower leg     Past Surgical History  Procedure Date  . Nasal suregery     broken nose  . Circumcision 03/17/2012    Procedure: CIRCUMCISION ADULT;  Surgeon: Ky Barban, MD;  Location: [facility];  Service: Urology;  Laterality: N/A;  . Right leg     Family History  Problem Relation Age of Onset  . Diabetes Father   . Anesthesia problems Neg  Hx   . Hypotension Neg Hx   . Malignant hyperthermia Neg Hx   . Pseudochol deficiency Neg Hx     History  Substance Use Topics  . Smoking status: Current Everyday Smoker -- 0.5 packs/day for 25 years    Types: Cigarettes  . Smokeless tobacco: Not on file  . Alcohol Use: 29.4 oz/week    35 Cans of beer, 14 Shots of liquor per week     Daily      Review of Systems  Constitutional: Negative for fever and fatigue.  HENT: Negative for congestion, sinus pressure and ear discharge.   Eyes: Negative for discharge.  Respiratory: Negative for cough.   Cardiovascular: Negative for chest pain.  Gastrointestinal: Negative for abdominal pain and diarrhea.  Genitourinary: Negative for frequency and hematuria.  Musculoskeletal: Positive for back pain.  Skin: Negative for rash.  Neurological: Negative for seizures and headaches.  Hematological: Negative.   Psychiatric/Behavioral: Negative for hallucinations.  All other systems reviewed and are negative.    Allergies  Penicillins  Home Medications   Current Outpatient Rx  Name Route Sig Dispense Refill  . CITALOPRAM HYDROBROMIDE 20 MG PO TABS Oral Take 1 tablet (20 mg total) by mouth daily. 30 tablet 2  . CYCLOBENZAPRINE HCL 10 MG PO TABS Oral Take 1 tablet (10 mg  total) by mouth 3 (three) times daily as needed. For muscle spasms 45 tablet 1  . FEXOFENADINE HCL 180 MG PO TABS  TAKE ONE TABLET BY MOUTH EVERY DAY 30 tablet 3  . OMEGA-3 FATTY ACIDS 1000 MG PO CAPS Oral Take 1 g by mouth 3 (three) times daily.     Marland Kitchen GEMFIBROZIL 600 MG PO TABS Oral Take 1 tablet (600 mg total) by mouth 2 (two) times daily before a meal. FOR TREATMENT OF YOUR HIGH TRIGLYCERIDE LEVEL. 60 tablet 3  . HYDROCHLOROTHIAZIDE 25 MG PO TABS Oral Take 1 tablet (25 mg total) by mouth daily. 30 tablet 3  . HYDROCODONE-ACETAMINOPHEN 7.5-325 MG PO TABS Oral Take 1 tablet by mouth every 6 (six) hours as needed for pain. 56 tablet 2  . IBUPROFEN 800 MG PO TABS Oral Take 800  mg by mouth 3 (three) times daily.    . METHYLPREDNISOLONE 4 MG PO KIT  follow package directions 21 tablet 0  . ADULT MULTIVITAMIN W/MINERALS CH Oral Take 1 tablet by mouth daily.    Marland Kitchen OMEPRAZOLE 20 MG PO CPDR Oral Take 20 mg by mouth daily.    Marland Kitchen OMEPRAZOLE 20 MG PO CPDR  TAKE ONE CAPSULE BY MOUTH EVERY DAY 30 capsule 3  . TAMSULOSIN HCL 0.4 MG PO CAPS Oral Take 1 capsule (0.4 mg total) by mouth daily. 30 capsule 1  . TRAZODONE HCL 100 MG PO TABS Oral Take 100 mg by mouth at bedtime as needed. Sleep      Triage Vitals: BP 113/67  Pulse 112  Temp 98.5 F (36.9 C) (Oral)  Resp 20  Ht 5\' 10"  (1.778 m)  Wt 188 lb (85.276 kg)  BMI 26.98 kg/m2  SpO2 96%  Physical Exam  Nursing note and vitals reviewed. Constitutional: He is oriented to person, place, and time. He appears well-developed and well-nourished.  HENT:  Head: Normocephalic and atraumatic.  Eyes: Conjunctivae are normal.  Neck: No tracheal deviation present.  Cardiovascular: Normal rate, regular rhythm and normal heart sounds.   No murmur heard. Pulmonary/Chest: Effort normal.  Musculoskeletal: Normal range of motion. He exhibits tenderness (Left lower lumbar back tenderness. Positive left SLR. ).  Neurological: He is oriented to person, place, and time.  Skin: Skin is warm.  Psychiatric: He has a normal mood and affect.    ED Course  Procedures (including critical care time) DIAGNOSTIC STUDIES: Oxygen Saturation is 96% on room air, adequate by my interpretation.    COORDINATION OF CARE: At 350 PM Discussed treatment plan with patient which includes pain medicine. Patient agrees.   Labs Reviewed - No data to display No results found.   No diagnosis found.    MDM  The chart was scribed for me under my direct supervision.  I personally performed the history, physical, and medical decision making and all procedures in the evaluation of this patient.[name] Lennert, MD 08/21/12 [phone]

## 2012-08-21 NOTE — ED Notes (Signed)
Patient with no complaints at this time. Respirations even and unlabored. Skin warm/dry. Discharge instructions reviewed with patient at this time. Patient given opportunity to voice concerns/ask questions. Patient discharged at this time and left Emergency Department with steady gait.   

## 2012-08-21 NOTE — ED Notes (Signed)
Pain low back for 2 mos,  Out of pain pills.

## 2012-08-28 ENCOUNTER — Other Ambulatory Visit: Payer: Self-pay

## 2012-08-28 MED ORDER — OXYCODONE-ACETAMINOPHEN 5-325 MG PO TABS: 1.0000 | ORAL_TABLET | Freq: Three times a day (TID) | ORAL | Status: AC | PRN

## 2012-09-02 ENCOUNTER — Encounter (HOSPITAL_COMMUNITY): Payer: Self-pay | Admitting: Emergency Medicine

## 2012-09-02 ENCOUNTER — Emergency Department (HOSPITAL_COMMUNITY)
Admission: EM | Admit: 2012-09-02 | Discharge: 2012-09-02 | Disposition: A | Discharge status: Home / Self Care | Payer: Medicare Other | Attending: Emergency Medicine | Admitting: Emergency Medicine

## 2012-09-02 DIAGNOSIS — G8929 Other chronic pain: Secondary | ICD-10-CM

## 2012-09-02 DIAGNOSIS — M545 Low back pain, unspecified: Secondary | ICD-10-CM | POA: Insufficient documentation

## 2012-09-02 DIAGNOSIS — E785 Hyperlipidemia, unspecified: Secondary | ICD-10-CM | POA: Insufficient documentation

## 2012-09-02 DIAGNOSIS — K219 Gastro-esophageal reflux disease without esophagitis: Secondary | ICD-10-CM | POA: Insufficient documentation

## 2012-09-02 DIAGNOSIS — M109 Gout, unspecified: Secondary | ICD-10-CM | POA: Insufficient documentation

## 2012-09-02 DIAGNOSIS — M129 Arthropathy, unspecified: Secondary | ICD-10-CM | POA: Insufficient documentation

## 2012-09-02 DIAGNOSIS — F319 Bipolar disorder, unspecified: Secondary | ICD-10-CM | POA: Insufficient documentation

## 2012-09-02 DIAGNOSIS — F17200 Nicotine dependence, unspecified, uncomplicated: Secondary | ICD-10-CM | POA: Insufficient documentation

## 2012-09-02 MED ORDER — CYCLOBENZAPRINE HCL 10 MG PO TABS
10.0000 mg | ORAL_TABLET | Freq: Once | ORAL | Status: AC
  Administered 2012-09-02: 10 mg via ORAL
  Filled 2012-09-02: qty 1

## 2012-09-02 MED ORDER — HYDROMORPHONE HCL PF 2 MG/ML IJ SOLN
2.0000 mg | Freq: Once | INTRAMUSCULAR | Status: AC
  Administered 2012-09-02: 2 mg via INTRAMUSCULAR
  Filled 2012-09-02: qty 1

## 2012-09-02 MED ORDER — IBUPROFEN 800 MG PO TABS
800.0000 mg | ORAL_TABLET | Freq: Once | ORAL | Status: AC
  Administered 2012-09-02: 800 mg via ORAL
  Filled 2012-09-02: qty 1

## 2012-09-02 NOTE — ED Notes (Signed)
Patient presents to ER with c/o chronic low back pain and left hip pain.  States sees Dr. Jeanice Lim for same, but not receiving medications that help with pain.  Patient states he wants the "shot" that T. Triplett, PA gave him last time he was here and saw her.  Patient ambulatory with steady gait.

## 2012-09-02 NOTE — ED Notes (Signed)
Pt ambulatory leaving. Pt left with discharge instructions and verbalized understanding of instructions. Pt had no questions.   

## 2012-09-02 NOTE — ED Provider Notes (Signed)
History     CSN: [id number]  Arrival date & time 09/02/12  0109   None     No chief complaint on file.   (Consider location/radiation/quality/duration/timing/severity/associated sxs/prior treatment) HPI Hx per PT. Has chronic LBP, followed by pain clinic, recently on steroids and takes percocet daily.  No new pain, is sharp is L sided and worse with movement.  PT does not work, denies any recent trauma, bending or lifting. Pain is severe and not relieved by medications at home. occassionaly radiates to L hip. No weakness or numbness, has had MRI in the past. No incontinence, IVDA, DM or fevers.   Past Medical History  Diagnosis Date  . Bronchitis   . Acid reflux   . Pancreatitis   . Gout   . Bipolar disorder   . Hyperlipidemia   . Alcohol abuse   . Pseudocyst of pancreas 02/28/2012  . Arthritis   . Fracture of lower leg     Past Surgical History  Procedure Date  . Nasal suregery     broken nose  . Circumcision 03/17/2012    Procedure: CIRCUMCISION ADULT;  Surgeon: Ky Barban, MD;  Location: [facility];  Service: Urology;  Laterality: N/A;  . Right leg     Family History  Problem Relation Age of Onset  . Diabetes Father   . Anesthesia problems Neg Hx   . Hypotension Neg Hx   . Malignant hyperthermia Neg Hx   . Pseudochol deficiency Neg Hx     History  Substance Use Topics  . Smoking status: Current Every Day Smoker -- 0.5 packs/day for 25 years    Types: Cigarettes  . Smokeless tobacco: Not on file  . Alcohol Use: 29.4 oz/week    35 Cans of beer, 14 Shots of liquor per week     Daily      Review of Systems  Constitutional: Negative for fever and chills.  HENT: Negative for neck pain and neck stiffness.   Eyes: Negative for pain.  Respiratory: Negative for shortness of breath.   Cardiovascular: Negative for chest pain.  Gastrointestinal: Negative for abdominal pain.  Genitourinary: Negative for dysuria.  Musculoskeletal: Positive for back pain.  Skin:  Negative for rash.  Neurological: Negative for headaches.  All other systems reviewed and are negative.    Allergies  Penicillins  Home Medications   Current Outpatient Rx  Name Route Sig Dispense Refill  . CITALOPRAM HYDROBROMIDE 20 MG PO TABS Oral Take 1 tablet (20 mg total) by mouth daily. 30 tablet 2  . CYCLOBENZAPRINE HCL 10 MG PO TABS Oral Take 1 tablet (10 mg total) by mouth 3 (three) times daily as needed. For muscle spasms 45 tablet 1  . FEXOFENADINE HCL 180 MG PO TABS Oral Take 180 mg by mouth daily.    . OMEGA-3 FATTY ACIDS 1000 MG PO CAPS Oral Take 1 g by mouth 3 (three) times daily.     Marland Kitchen GEMFIBROZIL 600 MG PO TABS Oral Take 1 tablet (600 mg total) by mouth 2 (two) times daily before a meal. FOR TREATMENT OF YOUR HIGH TRIGLYCERIDE LEVEL. 60 tablet 3  . HYDROCHLOROTHIAZIDE 25 MG PO TABS Oral Take 1 tablet (25 mg total) by mouth daily. 30 tablet 3  . ADULT MULTIVITAMIN W/MINERALS CH Oral Take 1 tablet by mouth daily.    Marland Kitchen OMEPRAZOLE 20 MG PO CPDR Oral Take 20 mg by mouth daily.    . OXYCODONE-ACETAMINOPHEN 5-325 MG PO TABS Oral Take 1 tablet by mouth  3 (three) times daily as needed for pain. 45 tablet 0  . TAMSULOSIN HCL 0.4 MG PO CAPS Oral Take 1 capsule (0.4 mg total) by mouth daily. 30 capsule 1  . TRAZODONE HCL 100 MG PO TABS Oral Take 100 mg by mouth at bedtime as needed. Sleep      There were no vitals taken for this visit.  Physical Exam  Constitutional: He is oriented to person, place, and time. He appears well-developed and well-nourished.  HENT:  Head: Normocephalic and atraumatic.  Eyes: Conjunctivae normal and EOM are normal. Pupils are equal, round, and reactive to light.  Neck: Full passive range of motion without pain. Neck supple. No thyromegaly present.       No cervical spine tenderness  Cardiovascular: Normal rate, regular rhythm, S1 normal, S2 normal and intact distal pulses.   Pulmonary/Chest: Effort normal and breath sounds normal.  Abdominal:  Soft. Bowel sounds are normal. There is no tenderness. There is no CVA tenderness.  Musculoskeletal: Normal range of motion.       Tender L paralumbar no lesion or midline deformity. No LE deficits with equal DTRs, strengths and sensorium tot light touch  Neurological: He is alert and oriented to person, place, and time. He has normal strength and normal reflexes. No cranial nerve deficit or sensory deficit. He displays a negative Romberg sign. GCS eye subscore is 4. GCS verbal subscore is 5. GCS motor subscore is 6.       Normal Gait  Skin: Skin is warm and dry. No rash noted. No cyanosis. Nails show no clubbing.  Psychiatric: He has a normal mood and affect. His speech is normal and behavior is normal.    ED Course  Procedures (including critical care time)  Acute on Chronic LBP  IM dilaudid, motrin and flexeril  Ice applied to lower back  On recheck feeling improved and stable for d/c home.   No red flags or indication for emergent MRI at this time. No RX provided - PT has pain contract with his pain doctor.    MDM   Nursing notes, VS and old records reviewed. IM narcotics and medications provided.         Sunnie Nielsen, MD 09/02/12 [phone]

## 2012-09-03 ENCOUNTER — Telehealth: Payer: Self-pay | Admitting: Family Medicine

## 2012-09-03 NOTE — Telephone Encounter (Signed)
Please tell pt that he can take 1 or 2 of the percocet for severe pain, the medication he named was given in the ER through his IV. He has been set up to see a neurosurgeon, until he is evaluated he needs to continue his current medications and try not to go to the ER as they will not provide him with any other prescriptions.

## 2012-09-08 ENCOUNTER — Emergency Department (HOSPITAL_COMMUNITY)
Admission: EM | Admit: 2012-09-08 | Discharge: 2012-09-08 | Disposition: A | Discharge status: Home / Self Care | Payer: Medicare Other | Attending: Emergency Medicine | Admitting: Emergency Medicine

## 2012-09-08 ENCOUNTER — Encounter (HOSPITAL_COMMUNITY): Payer: Self-pay | Admitting: *Deleted

## 2012-09-08 DIAGNOSIS — F319 Bipolar disorder, unspecified: Secondary | ICD-10-CM | POA: Insufficient documentation

## 2012-09-08 DIAGNOSIS — G8929 Other chronic pain: Secondary | ICD-10-CM | POA: Insufficient documentation

## 2012-09-08 DIAGNOSIS — F17200 Nicotine dependence, unspecified, uncomplicated: Secondary | ICD-10-CM | POA: Insufficient documentation

## 2012-09-08 DIAGNOSIS — Z833 Family history of diabetes mellitus: Secondary | ICD-10-CM | POA: Insufficient documentation

## 2012-09-08 DIAGNOSIS — Z880 Allergy status to penicillin: Secondary | ICD-10-CM | POA: Insufficient documentation

## 2012-09-08 DIAGNOSIS — F1010 Alcohol abuse, uncomplicated: Secondary | ICD-10-CM | POA: Insufficient documentation

## 2012-09-08 DIAGNOSIS — M549 Dorsalgia, unspecified: Secondary | ICD-10-CM | POA: Insufficient documentation

## 2012-09-08 DIAGNOSIS — Z8489 Family history of other specified conditions: Secondary | ICD-10-CM | POA: Insufficient documentation

## 2012-09-08 DIAGNOSIS — K219 Gastro-esophageal reflux disease without esophagitis: Secondary | ICD-10-CM | POA: Insufficient documentation

## 2012-09-08 NOTE — ED Notes (Signed)
Pt reporting chronic back pain, worse in past few days.  Reports that he does have an appointment with physician but unable to see for another month.

## 2012-09-08 NOTE — ED Provider Notes (Signed)
History     CSN: [id number]  Arrival date & time 09/08/12  0209   First MD Initiated Contact with Patient 09/08/12 0236      Chief Complaint  Patient presents with  . Back Pain    (Consider location/radiation/quality/duration/timing/severity/associated sxs/prior treatment) HPI Comments: Bilateral lower back pain and radiation into the L leg - is a burning / aching pain.  Intermittent, when it hits it is severe.  No bowel / bladder discomfort.  No IVDU, no hx of cancer. No DM.  Able to ambulate but pain is worse with ambultion - review of the MR shows that pt is here frequently for back pain, has had percocet Rx in the last 2 weeks.  Patient is a 51 y.o. male presenting with back pain. The history is provided by the patient.  Back Pain  Pertinent negatives include no fever, no numbness and no weakness.    Past Medical History  Diagnosis Date  . Bronchitis   . Acid reflux   . Pancreatitis   . Gout   . Bipolar disorder   . Hyperlipidemia   . Alcohol abuse   . Pseudocyst of pancreas 02/28/2012  . Arthritis   . Fracture of lower leg     Past Surgical History  Procedure Date  . Nasal suregery     broken nose  . Circumcision 03/17/2012    Procedure: CIRCUMCISION ADULT;  Surgeon: Ky Barban, MD;  Location: [facility];  Service: Urology;  Laterality: N/A;  . Right leg     Family History  Problem Relation Age of Onset  . Diabetes Father   . Anesthesia problems Neg Hx   . Hypotension Neg Hx   . Malignant hyperthermia Neg Hx   . Pseudochol deficiency Neg Hx     History  Substance Use Topics  . Smoking status: Current Every Day Smoker -- 0.5 packs/day for 25 years    Types: Cigarettes  . Smokeless tobacco: Not on file  . Alcohol Use: 29.4 oz/week    35 Cans of beer, 14 Shots of liquor per week     Daily      Review of Systems  Constitutional: Negative for fever and chills.  HENT: Negative for neck pain.   Cardiovascular: Negative for leg swelling.    Gastrointestinal: Negative for nausea and vomiting.       No incontinence of bowel  Genitourinary: Negative for difficulty urinating.       No incontinence or retention  Musculoskeletal: Positive for back pain.  Skin: Negative for rash.  Neurological: Negative for weakness and numbness.    Allergies  Penicillins  Home Medications   Current Outpatient Rx  Name Route Sig Dispense Refill  . CITALOPRAM HYDROBROMIDE 20 MG PO TABS Oral Take 1 tablet (20 mg total) by mouth daily. 30 tablet 2  . CYCLOBENZAPRINE HCL 10 MG PO TABS Oral Take 1 tablet (10 mg total) by mouth 3 (three) times daily as needed. For muscle spasms 45 tablet 1  . FEXOFENADINE HCL 180 MG PO TABS Oral Take 180 mg by mouth daily.    . OMEGA-3 FATTY ACIDS 1000 MG PO CAPS Oral Take 1 g by mouth 3 (three) times daily.     Marland Kitchen GEMFIBROZIL 600 MG PO TABS Oral Take 1 tablet (600 mg total) by mouth 2 (two) times daily before a meal. FOR TREATMENT OF YOUR HIGH TRIGLYCERIDE LEVEL. 60 tablet 3  . HYDROCHLOROTHIAZIDE 25 MG PO TABS Oral Take 1 tablet (25 mg total) by  mouth daily. 30 tablet 3  . ADULT MULTIVITAMIN W/MINERALS CH Oral Take 1 tablet by mouth daily.    Marland Kitchen OMEPRAZOLE 20 MG PO CPDR Oral Take 20 mg by mouth daily.    . OXYCODONE-ACETAMINOPHEN 5-325 MG PO TABS Oral Take 1 tablet by mouth 3 (three) times daily as needed for pain. 45 tablet 0  . TAMSULOSIN HCL 0.4 MG PO CAPS Oral Take 1 capsule (0.4 mg total) by mouth daily. 30 capsule 1  . TRAZODONE HCL 100 MG PO TABS Oral Take 100 mg by mouth at bedtime as needed. Sleep      BP 118/74  Pulse 95  Temp 98 F (36.7 C) (Oral)  Ht 5\' 10"  (1.778 m)  Wt 187 lb (84.823 kg)  BMI 26.83 kg/m2  SpO2 98%  Physical Exam  Nursing note and vitals reviewed. Constitutional: He appears well-developed and well-nourished.  HENT:  Head: Normocephalic and atraumatic.  Eyes: Conjunctivae normal are normal. No scleral icterus.  Cardiovascular: Normal rate, regular rhythm and intact distal  pulses.   Pulmonary/Chest: Effort normal and breath sounds normal.  Abdominal: Soft.       No pulsating masses, no guarding, no tenderness  Musculoskeletal: He exhibits tenderness ( Local tenderness to the bialteral lower back and mild midline L spine).       No spinal tenderness of the cervical, thoracic or lumbar spines  Neurological: He is alert.       Gait is Normal despite pain, isolated strength of the bilateral lower extremities is normal, sensation normal, speech normal    Skin: Skin is warm and dry. No erythema.    ED Course  Procedures (including critical care time)  Labs Reviewed - No data to display No results found.   1. Chronic back pain       MDM  The patient appears in no discomfort, he is able to sit and stand without any difficulty, ambulates without any difficulty, demanding a shot of hydromorphone. He states that he has Percocet and muscle relaxants at the house, I have told him that I will not give him intramuscular hydromorphone for his symptoms of chronic pain, I have recommended that he followup with his neurosurgeon, he states that he has refused surgery in the past stating that he does not want surgery but at the same time states that using physical therapy has made the pain worse. The patient is unsatisfied with his care, I have offered to give him Percocet here, he has refused in his left before getting his papers.          Vida Roller, MD 09/08/12 [phone]

## 2012-09-11 NOTE — Telephone Encounter (Signed)
Noted  

## 2012-09-17 ENCOUNTER — Telehealth: Payer: Self-pay | Admitting: Family Medicine

## 2012-09-17 MED ORDER — OXYCODONE-ACETAMINOPHEN 5-325 MG PO TABS: 1.0000 | ORAL_TABLET | ORAL | Status: DC | PRN

## 2012-09-17 NOTE — Telephone Encounter (Signed)
Please advise 

## 2012-09-17 NOTE — Telephone Encounter (Signed)
Percocet refilled, on pain contract

## 2012-09-22 ENCOUNTER — Encounter: Payer: Self-pay | Admitting: Family Medicine

## 2012-09-22 ENCOUNTER — Other Ambulatory Visit: Payer: Self-pay | Admitting: Family Medicine

## 2012-09-22 ENCOUNTER — Ambulatory Visit (INDEPENDENT_AMBULATORY_CARE_PROVIDER_SITE_OTHER): Payer: Medicare Other | Admitting: Family Medicine

## 2012-09-22 VITALS — BP 120/90 | HR 93 | Temp 98.7°F | Resp 15 | Ht 70.0 in | Wt 180.1 lb

## 2012-09-22 DIAGNOSIS — R110 Nausea: Secondary | ICD-10-CM

## 2012-09-22 DIAGNOSIS — Z720 Tobacco use: Secondary | ICD-10-CM

## 2012-09-22 DIAGNOSIS — F17200 Nicotine dependence, unspecified, uncomplicated: Secondary | ICD-10-CM

## 2012-09-22 DIAGNOSIS — K861 Other chronic pancreatitis: Secondary | ICD-10-CM

## 2012-09-22 DIAGNOSIS — R109 Unspecified abdominal pain: Secondary | ICD-10-CM

## 2012-09-22 DIAGNOSIS — J40 Bronchitis, not specified as acute or chronic: Secondary | ICD-10-CM

## 2012-09-22 LAB — CBC WITH DIFFERENTIAL/PLATELET
Basophils Absolute: 0 10*3/uL (ref 0.0–0.1)
Basophils Relative: 0 % (ref 0–1)
Eosinophils Absolute: 0.1 10*3/uL (ref 0.0–0.7)
Eosinophils Relative: 1 % (ref 0–5)
HCT: 44.1 % (ref 39.0–52.0)
Hemoglobin: 15.1 g/dL (ref 13.0–17.0)
Lymphocytes Relative: 19 % (ref 12–46)
Lymphs Abs: 1.3 10*3/uL (ref 0.7–4.0)
MCH: 33.5 pg (ref 26.0–34.0)
MCHC: 34.2 g/dL (ref 30.0–36.0)
MCV: 97.8 fL (ref 78.0–100.0)
Monocytes Absolute: 0.6 10*3/uL (ref 0.1–1.0)
Monocytes Relative: 8 % (ref 3–12)
Neutro Abs: 4.8 10*3/uL (ref 1.7–7.7)
Neutrophils Relative %: 70 % (ref 43–77)
Platelets: 351 10*3/uL (ref 150–400)
RBC: 4.51 MIL/uL (ref 4.22–5.81)
RDW: 14.6 % (ref 11.5–15.5)
WBC: 6.8 10*3/uL (ref 4.0–10.5)

## 2012-09-22 LAB — COMPREHENSIVE METABOLIC PANEL
ALT: 14 U/L (ref 0–53)
AST: 38 U/L — ABNORMAL HIGH (ref 0–37)
Albumin: 3.7 g/dL (ref 3.5–5.2)
Alkaline Phosphatase: 59 U/L (ref 39–117)
BUN: 11 mg/dL (ref 6–23)
CO2: 30 mEq/L (ref 19–32)
Calcium: 9.8 mg/dL (ref 8.4–10.5)
Chloride: 96 mEq/L (ref 96–112)
Creat: 0.85 mg/dL (ref 0.50–1.35)
Glucose, Bld: 92 mg/dL (ref 70–99)
Potassium: 3.9 mEq/L (ref 3.5–5.3)
Sodium: 136 mEq/L (ref 135–145)
Total Bilirubin: 0.5 mg/dL (ref 0.3–1.2)
Total Protein: 7.4 g/dL (ref 6.0–8.3)

## 2012-09-22 LAB — LIPASE: Lipase: 34 U/L (ref 11–59)

## 2012-09-22 MED ORDER — ALBUTEROL SULFATE HFA 108 (90 BASE) MCG/ACT IN AERS: 2.0000 | INHALATION_SPRAY | RESPIRATORY_TRACT | Status: DC | PRN

## 2012-09-22 MED ORDER — PROMETHAZINE HCL 12.5 MG PO TABS: 12.5000 mg | ORAL_TABLET | Freq: Four times a day (QID) | ORAL | Status: DC | PRN

## 2012-09-22 MED ORDER — AZITHROMYCIN 250 MG PO TABS: ORAL_TABLET | ORAL | Status: AC

## 2012-09-22 MED ORDER — PROMETHAZINE HCL 25 MG/ML IJ SOLN
25.0000 mg | Freq: Once | INTRAMUSCULAR | Status: AC
  Administered 2012-09-22: 25 mg via INTRAMUSCULAR

## 2012-09-22 NOTE — Patient Instructions (Signed)
Get the labs done Start antibiotic Nausea shot given Go to ER if you do not improve, abdominal pain gets worse

## 2012-09-22 NOTE — Assessment & Plan Note (Signed)
Antibiotics and inhaler given.

## 2012-09-22 NOTE — Progress Notes (Signed)
  Subjective:    Patient ID: Depriest Gahan, male    DOB: September 03, 1961, 51 y.o.   MRN: [id number]  HPI  Patient presents with cough with production for the past week. He also feels very congested. He admits to nausea without emesis. He is mild stomach upset but not severe such as his previous pancreatitis. He has decreased appetite and a few episodes of diarrhea which is now resolved. Admits to subjective fever and feeling SOB worse at night when he wheezes   Review of Systems  GEN- denies fatigue, + subjective fever, weight loss,weakness, recent illness HEENT- denies eye drainage, change in vision, nasal discharge, CVS- denies chest pain, palpitations RESP- + SOB,+ cough, +wheeze ABD- denies N/V, change in stools, +abd pain GU- denies dysuria, hematuria, dribbling, incontinence MSK- denies joint pain, muscle aches, injury Neuro- denies headache, dizziness, syncope, seizure activity      Objective:   Physical Exam GEN- NAD, alert and oriented x3 HEENT- PERRL, EOMI, non injected sclera, pink conjunctiva, MMM, oropharynx clear, TM clear bilat  Neck- Supple, no LAD CVS- RRR, no murmur RESP-course upper airway BS, no wheeze, normal WOB ABD-NABS,soft, TTP Diffusely, no rebound, no guarding EXT- No edema Pulses- Radial, DP- 2+        Assessment & Plan:

## 2012-09-22 NOTE — Assessment & Plan Note (Signed)
Labs unremarkable above for abdominal pain

## 2012-09-22 NOTE — Addendum Note (Signed)
Addended by: Abner Greenspan on: 09/22/2012 04:31 PM   Modules accepted: Orders

## 2012-09-22 NOTE — Assessment & Plan Note (Signed)
His exam shows some early signs for possible early pancreatitis with his nausea. He may just have soreness from coughing too much. I will place him on liquid diet. Stat labs were obtained his lipase is normal see met and CBC unremarkable. He has chronic pain medication nausea medicine was also sent. No signs of acute abdomen

## 2012-09-22 NOTE — Assessment & Plan Note (Signed)
Counseled on importance of cessation 

## 2012-10-02 ENCOUNTER — Emergency Department (HOSPITAL_COMMUNITY): Payer: Medicare Other

## 2012-10-02 ENCOUNTER — Observation Stay (HOSPITAL_COMMUNITY)
Admission: EM | Admit: 2012-10-02 | Discharge: 2012-10-03 | Disposition: A | Discharge status: Home / Self Care | Payer: Medicare Other | Attending: Internal Medicine | Admitting: Internal Medicine

## 2012-10-02 ENCOUNTER — Encounter (HOSPITAL_COMMUNITY): Payer: Self-pay | Admitting: *Deleted

## 2012-10-02 DIAGNOSIS — S82892A Other fracture of left lower leg, initial encounter for closed fracture: Secondary | ICD-10-CM

## 2012-10-02 DIAGNOSIS — L0390 Cellulitis, unspecified: Secondary | ICD-10-CM

## 2012-10-02 DIAGNOSIS — E785 Hyperlipidemia, unspecified: Secondary | ICD-10-CM | POA: Insufficient documentation

## 2012-10-02 DIAGNOSIS — R7402 Elevation of levels of lactic acid dehydrogenase (LDH): Secondary | ICD-10-CM | POA: Insufficient documentation

## 2012-10-02 DIAGNOSIS — N529 Male erectile dysfunction, unspecified: Secondary | ICD-10-CM

## 2012-10-02 DIAGNOSIS — E860 Dehydration: Secondary | ICD-10-CM

## 2012-10-02 DIAGNOSIS — K861 Other chronic pancreatitis: Secondary | ICD-10-CM | POA: Insufficient documentation

## 2012-10-02 DIAGNOSIS — R7401 Elevation of levels of liver transaminase levels: Secondary | ICD-10-CM

## 2012-10-02 DIAGNOSIS — R3911 Hesitancy of micturition: Secondary | ICD-10-CM

## 2012-10-02 DIAGNOSIS — K863 Pseudocyst of pancreas: Secondary | ICD-10-CM

## 2012-10-02 DIAGNOSIS — S93409A Sprain of unspecified ligament of unspecified ankle, initial encounter: Secondary | ICD-10-CM

## 2012-10-02 DIAGNOSIS — E876 Hypokalemia: Secondary | ICD-10-CM

## 2012-10-02 DIAGNOSIS — G894 Chronic pain syndrome: Secondary | ICD-10-CM | POA: Diagnosis present

## 2012-10-02 DIAGNOSIS — R05 Cough: Secondary | ICD-10-CM

## 2012-10-02 DIAGNOSIS — K2900 Acute gastritis without bleeding: Principal | ICD-10-CM

## 2012-10-02 DIAGNOSIS — J40 Bronchitis, not specified as acute or chronic: Secondary | ICD-10-CM

## 2012-10-02 DIAGNOSIS — E871 Hypo-osmolality and hyponatremia: Secondary | ICD-10-CM

## 2012-10-02 DIAGNOSIS — R1110 Vomiting, unspecified: Secondary | ICD-10-CM | POA: Diagnosis present

## 2012-10-02 DIAGNOSIS — R109 Unspecified abdominal pain: Secondary | ICD-10-CM

## 2012-10-02 DIAGNOSIS — J309 Allergic rhinitis, unspecified: Secondary | ICD-10-CM | POA: Diagnosis present

## 2012-10-02 DIAGNOSIS — K760 Fatty (change of) liver, not elsewhere classified: Secondary | ICD-10-CM

## 2012-10-02 DIAGNOSIS — M5430 Sciatica, unspecified side: Secondary | ICD-10-CM

## 2012-10-02 DIAGNOSIS — R059 Cough, unspecified: Secondary | ICD-10-CM

## 2012-10-02 DIAGNOSIS — K219 Gastro-esophageal reflux disease without esophagitis: Secondary | ICD-10-CM

## 2012-10-02 DIAGNOSIS — M25569 Pain in unspecified knee: Secondary | ICD-10-CM

## 2012-10-02 DIAGNOSIS — G8929 Other chronic pain: Secondary | ICD-10-CM

## 2012-10-02 DIAGNOSIS — I10 Essential (primary) hypertension: Secondary | ICD-10-CM

## 2012-10-02 DIAGNOSIS — M549 Dorsalgia, unspecified: Secondary | ICD-10-CM

## 2012-10-02 DIAGNOSIS — E781 Pure hyperglyceridemia: Secondary | ICD-10-CM

## 2012-10-02 DIAGNOSIS — M25539 Pain in unspecified wrist: Secondary | ICD-10-CM

## 2012-10-02 DIAGNOSIS — R112 Nausea with vomiting, unspecified: Secondary | ICD-10-CM | POA: Insufficient documentation

## 2012-10-02 DIAGNOSIS — Z720 Tobacco use: Secondary | ICD-10-CM

## 2012-10-02 DIAGNOSIS — R197 Diarrhea, unspecified: Secondary | ICD-10-CM | POA: Insufficient documentation

## 2012-10-02 DIAGNOSIS — F319 Bipolar disorder, unspecified: Secondary | ICD-10-CM

## 2012-10-02 DIAGNOSIS — F1010 Alcohol abuse, uncomplicated: Secondary | ICD-10-CM

## 2012-10-02 LAB — CBC WITH DIFFERENTIAL/PLATELET
Band Neutrophils: 0 % (ref 0–10)
Basophils Absolute: 0 10*3/uL (ref 0.0–0.1)
Basophils Relative: 0 % (ref 0–1)
Blasts: 0 %
Eosinophils Absolute: 0.1 10*3/uL (ref 0.0–0.7)
Eosinophils Relative: 2 % (ref 0–5)
HCT: 43.5 % (ref 39.0–52.0)
Hemoglobin: 15.8 g/dL (ref 13.0–17.0)
Lymphocytes Relative: 34 % (ref 12–46)
Lymphs Abs: 1.8 10*3/uL (ref 0.7–4.0)
MCH: 34.7 pg — ABNORMAL HIGH (ref 26.0–34.0)
MCHC: 36.3 g/dL — ABNORMAL HIGH (ref 30.0–36.0)
MCV: 95.6 fL (ref 78.0–100.0)
Metamyelocytes Relative: 0 %
Monocytes Absolute: 0.4 10*3/uL (ref 0.1–1.0)
Monocytes Relative: 7 % (ref 3–12)
Myelocytes: 0 %
Neutro Abs: 2.9 10*3/uL (ref 1.7–7.7)
Neutrophils Relative %: 57 % (ref 43–77)
Platelets: 389 10*3/uL (ref 150–400)
Promyelocytes Absolute: 0 %
RBC: 4.55 MIL/uL (ref 4.22–5.81)
RDW: 13.9 % (ref 11.5–15.5)
WBC: 5.2 10*3/uL (ref 4.0–10.5)
nRBC: 0 /100 WBC

## 2012-10-02 LAB — BASIC METABOLIC PANEL
BUN: 13 mg/dL (ref 6–23)
CO2: 25 mEq/L (ref 19–32)
Calcium: 8.7 mg/dL (ref 8.4–10.5)
Chloride: 92 mEq/L — ABNORMAL LOW (ref 96–112)
Creatinine, Ser: 0.73 mg/dL (ref 0.50–1.35)
GFR calc Af Amer: >90 mL/min (ref >90)
GFR calc non Af Amer: >90 mL/min (ref >90)
Glucose, Bld: 83 mg/dL (ref 70–99)
Potassium: 3.8 mEq/L (ref 3.5–5.1)
Sodium: 130 mEq/L — ABNORMAL LOW (ref 135–145)

## 2012-10-02 LAB — LIPASE, BLOOD: Lipase: 35 U/L (ref 11–59)

## 2012-10-02 LAB — URINALYSIS, ROUTINE W REFLEX MICROSCOPIC
Bilirubin Urine: NEGATIVE
Glucose, UA: NEGATIVE mg/dL
Hgb urine dipstick: NEGATIVE
Ketones, ur: 15 mg/dL — AB
Leukocytes, UA: NEGATIVE
Nitrite: NEGATIVE
Protein, ur: NEGATIVE mg/dL
Specific Gravity, Urine: 1.025 (ref 1.005–1.030)
Urobilinogen, UA: 0.2 mg/dL (ref 0.0–1.0)
pH: 6.5 (ref 5.0–8.0)

## 2012-10-02 LAB — HEPATIC FUNCTION PANEL
ALT: 33 U/L (ref 0–53)
AST: 103 U/L — ABNORMAL HIGH (ref 0–37)
Albumin: 3.5 g/dL (ref 3.5–5.2)
Alkaline Phosphatase: 68 U/L (ref 39–117)
Bilirubin, Direct: 0.1 mg/dL (ref 0.0–0.3)
Indirect Bilirubin: 0.2 mg/dL — ABNORMAL LOW (ref 0.3–0.9)
Total Bilirubin: 0.3 mg/dL (ref 0.3–1.2)
Total Protein: 7.2 g/dL (ref 6.0–8.3)

## 2012-10-02 LAB — TROPONIN I: Troponin I: <0.3 ng/mL (ref <0.30)

## 2012-10-02 MED ORDER — ONDANSETRON HCL 4 MG/2ML IJ SOLN
4.0000 mg | Freq: Once | INTRAMUSCULAR | Status: AC
  Administered 2012-10-02: 4 mg via INTRAVENOUS

## 2012-10-02 MED ORDER — PANTOPRAZOLE SODIUM 40 MG IV SOLR
40.0000 mg | Freq: Two times a day (BID) | INTRAVENOUS | Status: DC
  Administered 2012-10-02: 40 mg via INTRAVENOUS
  Filled 2012-10-02: qty 40

## 2012-10-02 MED ORDER — IOHEXOL 300 MG/ML  SOLN
100.0000 mL | Freq: Once | INTRAMUSCULAR | Status: AC | PRN
  Administered 2012-10-02: 100 mL via INTRAVENOUS

## 2012-10-02 MED ORDER — LORAZEPAM 2 MG/ML IJ SOLN: 1.0000 mg | Freq: Four times a day (QID) | INTRAMUSCULAR | Status: DC | PRN

## 2012-10-02 MED ORDER — CITALOPRAM HYDROBROMIDE 20 MG PO TABS
20.0000 mg | ORAL_TABLET | Freq: Every day | ORAL | Status: DC
  Administered 2012-10-03: 20 mg via ORAL
  Filled 2012-10-02: qty 1

## 2012-10-02 MED ORDER — LORAZEPAM 1 MG PO TABS
0.0000 mg | ORAL_TABLET | Freq: Four times a day (QID) | ORAL | Status: DC
Start: 2012-10-02 — End: 2012-10-03
  Administered 2012-10-02: 1 mg via ORAL
  Filled 2012-10-02: qty 1

## 2012-10-02 MED ORDER — VITAMIN B-1 100 MG PO TABS
100.0000 mg | ORAL_TABLET | Freq: Every day | ORAL | Status: DC
  Administered 2012-10-03: 100 mg via ORAL
  Filled 2012-10-02: qty 1

## 2012-10-02 MED ORDER — TAMSULOSIN HCL 0.4 MG PO CAPS
0.4000 mg | ORAL_CAPSULE | Freq: Every day | ORAL | Status: DC
  Administered 2012-10-03: 0.4 mg via ORAL

## 2012-10-02 MED ORDER — HYDROMORPHONE HCL PF 2 MG/ML IJ SOLN
2.0000 mg | Freq: Once | INTRAMUSCULAR | Status: AC
  Administered 2012-10-02: 2 mg via INTRAVENOUS
  Filled 2012-10-02: qty 1

## 2012-10-02 MED ORDER — LORAZEPAM 1 MG PO TABS: 1.0000 mg | ORAL_TABLET | Freq: Four times a day (QID) | ORAL | Status: DC | PRN

## 2012-10-02 MED ORDER — HYDROMORPHONE HCL PF 2 MG/ML IJ SOLN
2.0000 mg | Freq: Once | INTRAMUSCULAR | Status: AC
  Administered 2012-10-02: 2 mg via INTRAVENOUS

## 2012-10-02 MED ORDER — LORAZEPAM 1 MG PO TABS
0.0000 mg | ORAL_TABLET | Freq: Two times a day (BID) | ORAL | Status: DC
Start: 2012-10-04 — End: 2012-10-03

## 2012-10-02 MED ORDER — SODIUM CHLORIDE 0.9 % IV SOLN
Freq: Once | INTRAVENOUS | Status: AC
  Administered 2012-10-02: 19:00:00 via INTRAVENOUS

## 2012-10-02 MED ORDER — ALBUTEROL SULFATE HFA 108 (90 BASE) MCG/ACT IN AERS: 2.0000 | INHALATION_SPRAY | RESPIRATORY_TRACT | Status: DC | PRN

## 2012-10-02 MED ORDER — THIAMINE HCL 100 MG/ML IJ SOLN: 100.0000 mg | Freq: Every day | INTRAMUSCULAR | Status: DC

## 2012-10-02 MED ORDER — ACETAMINOPHEN 650 MG RE SUPP: 650.0000 mg | Freq: Four times a day (QID) | RECTAL | Status: DC | PRN

## 2012-10-02 MED ORDER — SODIUM CHLORIDE 0.9 % IV SOLN
INTRAVENOUS | Status: DC
  Administered 2012-10-02: 23:00:00 via INTRAVENOUS

## 2012-10-02 MED ORDER — ALBUTEROL SULFATE (5 MG/ML) 0.5% IN NEBU: 2.5000 mg | INHALATION_SOLUTION | RESPIRATORY_TRACT | Status: DC | PRN

## 2012-10-02 MED ORDER — ONDANSETRON HCL 4 MG/2ML IJ SOLN
INTRAMUSCULAR | Status: AC
  Administered 2012-10-02: 4 mg via INTRAVENOUS
  Filled 2012-10-02: qty 2

## 2012-10-02 MED ORDER — PANTOPRAZOLE SODIUM 40 MG IV SOLR
40.0000 mg | Freq: Once | INTRAVENOUS | Status: AC
  Administered 2012-10-02: 40 mg via INTRAVENOUS
  Filled 2012-10-02: qty 40

## 2012-10-02 MED ORDER — HYDROMORPHONE HCL PF 1 MG/ML IJ SOLN
1.0000 mg | INTRAMUSCULAR | Status: DC | PRN
  Administered 2012-10-02 – 2012-10-03 (×3): 1 mg via INTRAVENOUS
  Filled 2012-10-02 (×3): qty 1

## 2012-10-02 MED ORDER — ADULT MULTIVITAMIN W/MINERALS CH: 1.0000 | ORAL_TABLET | Freq: Every day | ORAL | Status: DC

## 2012-10-02 MED ORDER — ONDANSETRON HCL 4 MG PO TABS: 4.0000 mg | ORAL_TABLET | Freq: Four times a day (QID) | ORAL | Status: DC | PRN

## 2012-10-02 MED ORDER — FOLIC ACID 1 MG PO TABS: 1.0000 mg | ORAL_TABLET | Freq: Every day | ORAL | Status: DC

## 2012-10-02 MED ORDER — CYCLOBENZAPRINE HCL 10 MG PO TABS: 10.0000 mg | ORAL_TABLET | Freq: Three times a day (TID) | ORAL | Status: DC | PRN

## 2012-10-02 MED ORDER — HYDROMORPHONE HCL PF 2 MG/ML IJ SOLN
INTRAMUSCULAR | Status: AC
  Administered 2012-10-02: 2 mg via INTRAVENOUS
  Filled 2012-10-02: qty 1

## 2012-10-02 MED ORDER — ONDANSETRON HCL 4 MG/2ML IJ SOLN: 4.0000 mg | Freq: Four times a day (QID) | INTRAMUSCULAR | Status: DC | PRN

## 2012-10-02 MED ORDER — ACETAMINOPHEN 325 MG PO TABS: 650.0000 mg | ORAL_TABLET | Freq: Four times a day (QID) | ORAL | Status: DC | PRN

## 2012-10-02 NOTE — ED Notes (Signed)
abd pain,n/v, no diarrhea.

## 2012-10-02 NOTE — ED Provider Notes (Signed)
History     CSN: [id number]  Arrival date & time 10/02/12  1622   First MD Initiated Contact with Patient 10/02/12 1826      Chief Complaint  Patient presents with  . Abdominal Pain    (Consider location/radiation/quality/duration/timing/severity/associated sxs/prior treatment) HPI Comments: Is a 51 year old man who says he has felt nausea on and off for a week today he developed midabdominal pain and vomiting. This started this morning. He had diarrhea a couple of days ago. He's had prior similar episodes, with a diagnosis of acute pancreatitis in December of 2012, which is male companion said was related to hyperlipidemia.  Patient is a 51 y.o. male presenting with abdominal pain.  Abdominal Pain The primary symptoms of the illness include abdominal pain, nausea and vomiting. The primary symptoms of the illness do not include diarrhea. The current episode started 6 to 12 hours ago. The onset of the illness was sudden. The problem has not changed since onset. The abdominal pain began 6 to 12 hours ago. The pain came on suddenly. The abdominal pain has been unchanged since its onset. The abdominal pain is located in the periumbilical region. The abdominal pain radiates to the back. The severity of the abdominal pain is 7/10. The abdominal pain is relieved by nothing. The abdominal pain is exacerbated by vomiting.  Associated with: Prior history of pancreatitis. The patient has not had a change in bowel habit. Additional symptoms associated with the illness include anorexia and back pain. Symptoms associated with the illness do not include chills, diaphoresis or constipation.    Past Medical History  Diagnosis Date  . Bronchitis   . Acid reflux   . Pancreatitis   . Gout   . Bipolar disorder   . Hyperlipidemia   . Alcohol abuse   . Pseudocyst of pancreas 02/28/2012  . Arthritis   . Fracture of lower leg     Past Surgical History  Procedure Date  . Nasal suregery     broken  nose  . Circumcision 03/17/2012    Procedure: CIRCUMCISION ADULT;  Surgeon: Ky Barban, MD;  Location: [facility];  Service: Urology;  Laterality: N/A;  . Right leg     Family History  Problem Relation Age of Onset  . Diabetes Father   . Anesthesia problems Neg Hx   . Hypotension Neg Hx   . Malignant hyperthermia Neg Hx   . Pseudochol deficiency Neg Hx     History  Substance Use Topics  . Smoking status: Current Every Day Smoker -- 0.5 packs/day for 25 years    Types: Cigarettes  . Smokeless tobacco: Not on file  . Alcohol Use: 29.4 oz/week    35 Cans of beer, 14 Shots of liquor per week     Daily      Review of Systems  Constitutional: Negative for chills and diaphoresis.  HENT: Negative.   Eyes: Negative.   Respiratory: Negative.   Cardiovascular: Negative.   Gastrointestinal: Positive for nausea, vomiting, abdominal pain and anorexia. Negative for diarrhea and constipation.  Genitourinary: Negative.   Musculoskeletal: Positive for back pain.  Neurological: Negative.   Psychiatric/Behavioral: Negative.     Allergies  Penicillins  Home Medications   Current Outpatient Rx  Name Route Sig Dispense Refill  . ALBUTEROL SULFATE HFA 108 (90 BASE) MCG/ACT IN AERS Inhalation Inhale 2 puffs into the lungs every 4 (four) hours as needed for wheezing. 1 Inhaler 1  . CITALOPRAM HYDROBROMIDE 20 MG PO TABS Oral Take  1 tablet (20 mg total) by mouth daily. 30 tablet 2  . CYCLOBENZAPRINE HCL 10 MG PO TABS  TAKE ONE TABLET BY MOUTH THREE TIMES DAILY AS NEEDED FOR MUSCLE SPASM 45 tablet 3  . FEXOFENADINE HCL 180 MG PO TABS Oral Take 180 mg by mouth daily.    . OMEGA-3 FATTY ACIDS 1000 MG PO CAPS Oral Take 1 g by mouth 3 (three) times daily.     Marland Kitchen GEMFIBROZIL 600 MG PO TABS Oral Take 1 tablet (600 mg total) by mouth 2 (two) times daily before a meal. FOR TREATMENT OF YOUR HIGH TRIGLYCERIDE LEVEL. 60 tablet 3  . HYDROCHLOROTHIAZIDE 25 MG PO TABS Oral Take 1 tablet (25 mg total) by  mouth daily. 30 tablet 3  . ADULT MULTIVITAMIN W/MINERALS CH Oral Take 1 tablet by mouth daily.    Marland Kitchen OMEPRAZOLE 20 MG PO CPDR Oral Take 20 mg by mouth daily.    . OXYCODONE-ACETAMINOPHEN 5-325 MG PO TABS Oral Take 1 tablet by mouth every 4 (four) hours as needed for pain. 45 tablet 0  . PROMETHAZINE HCL 12.5 MG PO TABS Oral Take 1 tablet (12.5 mg total) by mouth every 6 (six) hours as needed for nausea. 30 tablet 0  . TAMSULOSIN HCL 0.4 MG PO CAPS Oral Take 1 capsule (0.4 mg total) by mouth daily. 30 capsule 1  . TRAZODONE HCL 100 MG PO TABS Oral Take 100 mg by mouth at bedtime as needed. Sleep      BP 132/99  Pulse 89  Temp 98.2 F (36.8 C) (Oral)  Resp 18  Ht 5\' 10"  (1.778 m)  Wt 180 lb (81.647 kg)  BMI 25.83 kg/m2  SpO2 100%  Physical Exam  Nursing note and vitals reviewed. Constitutional: He is oriented to person, place, and time. He appears well-developed and well-nourished.       Doubled over with midabdominal pain.  HENT:  Head: Normocephalic and atraumatic.  Right Ear: External ear normal.  Left Ear: External ear normal.  Mouth/Throat: Oropharynx is clear and moist.  Eyes: Conjunctivae normal and EOM are normal. Pupils are equal, round, and reactive to light. No scleral icterus.  Neck: Normal range of motion. Neck supple.  Cardiovascular: Normal rate, regular rhythm and normal heart sounds.   Pulmonary/Chest: Effort normal and breath sounds normal.  Abdominal:       He has no abdominal tenderness, which he localizes transversely across his abdomen over the umbilicus. There is no mass or point tenderness.  Musculoskeletal: Normal range of motion. He exhibits no edema and no tenderness.  Neurological: He is alert and oriented to person, place, and time.       No sensory or motor deficit.  Skin: Skin is warm and dry.  Psychiatric: He has a normal mood and affect. His behavior is normal.    ED Course  Procedures (including critical care time)   6:40 PM Pt was seen  and had physical examination.  Lab workup ordered.  IV fluids, IV medications for nausea and pain ordered.  Old charts reviewed.  8:49 PM Results for orders placed during the hospital encounter of 10/02/12  CBC WITH DIFFERENTIAL      Component Value Range   WBC 5.2  4.0 - 10.5 K/uL   RBC 4.55  4.22 - 5.81 MIL/uL   Hemoglobin 15.8  13.0 - 17.0 g/dL   HCT 40.9  81.1 - 91.4 %   MCV 95.6  78.0 - 100.0 fL   MCH 34.7 (*) 26.0 -  34.0 pg   MCHC 36.3 (*) 30.0 - 36.0 g/dL   RDW 40.9  81.1 - 91.4 %   Platelets 389  150 - 400 K/uL   Neutrophils Relative 57  43 - 77 %   Lymphocytes Relative 34  12 - 46 %   Monocytes Relative 7  3 - 12 %   Eosinophils Relative 2  0 - 5 %   Basophils Relative 0  0 - 1 %   Band Neutrophils 0  0 - 10 %   Metamyelocytes Relative 0     Myelocytes 0     Promyelocytes Absolute 0     Blasts 0     nRBC 0  0 /100 WBC   Neutro Abs 2.9  1.7 - 7.7 K/uL   Lymphs Abs 1.8  0.7 - 4.0 K/uL   Monocytes Absolute 0.4  0.1 - 1.0 K/uL   Eosinophils Absolute 0.1  0.0 - 0.7 K/uL   Basophils Absolute 0.0  0.0 - 0.1 K/uL  BASIC METABOLIC PANEL      Component Value Range   Sodium 130 (*) 135 - 145 mEq/L   Potassium 3.8  3.5 - 5.1 mEq/L   Chloride 92 (*) 96 - 112 mEq/L   CO2 25  19 - 32 mEq/L   Glucose, Bld 83  70 - 99 mg/dL   BUN 13  6 - 23 mg/dL   Creatinine, Ser 7.82  0.50 - 1.35 mg/dL   Calcium 8.7  8.4 - 95.6 mg/dL   GFR calc non Af Amer >90  >90 mL/min   GFR calc Af Amer >90  >90 mL/min  URINALYSIS, ROUTINE W REFLEX MICROSCOPIC      Component Value Range   Color, Urine YELLOW  YELLOW   APPearance CLEAR  CLEAR   Specific Gravity, Urine 1.025  1.005 - 1.030   pH 6.5  5.0 - 8.0   Glucose, UA NEGATIVE  NEGATIVE mg/dL   Hgb urine dipstick NEGATIVE  NEGATIVE   Bilirubin Urine NEGATIVE  NEGATIVE   Ketones, ur 15 (*) NEGATIVE mg/dL   Protein, ur NEGATIVE  NEGATIVE mg/dL   Urobilinogen, UA 0.2  0.0 - 1.0 mg/dL   Nitrite NEGATIVE  NEGATIVE   Leukocytes, UA NEGATIVE   NEGATIVE  HEPATIC FUNCTION PANEL      Component Value Range   Total Protein 7.2  6.0 - 8.3 g/dL   Albumin 3.5  3.5 - 5.2 g/dL   AST 213 (*) 0 - 37 U/L   ALT 33  0 - 53 U/L   Alkaline Phosphatase 68  39 - 117 U/L   Total Bilirubin 0.3  0.3 - 1.2 mg/dL   Bilirubin, Direct 0.1  0.0 - 0.3 mg/dL   Indirect Bilirubin 0.2 (*) 0.3 - 0.9 mg/dL  LIPASE, BLOOD      Component Value Range   Lipase 35  11 - 59 U/L   Ct Abdomen Pelvis W Contrast  10/02/2012  *RADIOLOGY REPORT*  Clinical Data: Abdominal pain.  Nausea and vomiting.  Personal history of pancreatitis.  CT ABDOMEN AND PELVIS WITH CONTRAST  Technique:  Multidetector CT imaging of the abdomen and pelvis was performed following the standard protocol during bolus administration of intravenous contrast.  Contrast: OMNIPAQUE IOHEXOL 300 MG/ML  SOLN  Comparison: 12/10/2011  Findings: Increased hepatic steatosis noted, but no liver masses are identified.  Gallbladder is unremarkable.  The pancreas is normal appearance, without evidence of pancreatic mass or peripancreatic inflammatory changes.  No abnormal fluid  collections are seen.  Chronic splenic vein thrombosis again noted, however spleen is normal in size in appearance.  The adrenal glands and kidneys are normal appearance.  No soft tissue masses or lymphadenopathy identified within the abdomen or pelvis.  No evidence of inflammatory process or abnormal fluid collections.  Unopacified bowel loops are unremarkable appearance. No hernia identified.  IMPRESSION:  1.  No signs of acute pancreatitis or other acute findings. 2.  Mildly increased hepatic steatosis noted. 3.  Chronic splenic vein thrombosis.   Original Report Authenticated By: Danae Orleans, M.D.     Pt's lab work is negative, but he continues with pain, probably a gastritis.  Call to [facility] to admit him to observation for abdominal pain.  9:29 PM Case discussed with Dr. Osvaldo Shipper --> admit to Team 1 to a med-surg  bed to observation status.  1. Acute gastritis          Carleene Cooper III, MD 10/02/12 2130

## 2012-10-02 NOTE — ED Notes (Signed)
Pt is irate at this time wanting to know why he has not been seen after a 2 hour wait. Pt was informed that we were going to speak with a physician about coming to see him he then informed me that it was about his skin color and that he had insurance and he would leave and go to [employer]. R.N. Aware at this time. [name] [name]

## 2012-10-02 NOTE — H&P (Signed)
[name] [name] is an 51 y.o. male.    PCP: Milinda Antis, MD   Chief Complaint: Nausea, vomiting, and abdominal pain  HPI: This is a 51 year old, African American male, with a past medical history of hypertension, chronic pancreatitis, chronic back pain, who was in his usual state of health about 2-3 days ago, when he started having nausea. He was seen by his primary care physician about 10 days ago for symptoms of bronchitis, and some nausea. He was prescribed unknown antibiotics. His symptoms of bronchitis and sinusitis have improved. However, he started having nausea, vomiting about 2 days ago. He's had very poor appetite. His symptoms got worse and then today started having upper abdominal discomfort and has thrown up about 3 times. Denies any blood in the emesis. The emesis consisted of yellowish fluid. Has been having a lot of acid reflux and has taken Rolaids and TUMS with no relief. The abdominal pain was sharp in character, 7/10 in intensity, located in the upper abdomen and radiating to the back. He's been sweating a lot, but denies any fever or chills. No chest pain or shortness of breath. No sick contacts. He, says, that he ate at [employer] and at a [employer] on Wednesday. He had diarrhea on Monday, but none since then. He denies having had any colonoscopy or EGD in the past. He's lost about 10 pounds in the last one year. He is feeling a little better after receiving pain medications in the ED, but still has significant pain and is very nauseous.   Home Medications: Prior to Admission medications   Medication Sig Start Date End Date Taking? Authorizing Provider  albuterol (PROVENTIL HFA;VENTOLIN HFA) 108 (90 BASE) MCG/ACT inhaler Inhale 2 puffs into the lungs every 4 (four) hours as needed for wheezing. 09/22/12  Yes Salley Scarlet, MD  citalopram (CELEXA) 20 MG tablet Take 1 tablet (20 mg total) by mouth daily. 03/08/12  Yes Salley Scarlet, MD  cyclobenzaprine (FLEXERIL) 10 MG  tablet Take 10 mg by mouth 3 (three) times daily as needed. For muscle spasms   Yes Historical Provider, MD  fexofenadine (ALLEGRA) 180 MG tablet Take 180 mg by mouth daily.   Yes Historical Provider, MD  fish oil-omega-3 fatty acids 1000 MG capsule Take 1 g by mouth 3 (three) times daily.    Yes Historical Provider, MD  gemfibrozil (LOPID) 600 MG tablet Take 1 tablet (600 mg total) by mouth 2 (two) times daily before a meal. FOR TREATMENT OF YOUR HIGH TRIGLYCERIDE LEVEL. 07/01/12 07/01/13 Yes Salley Scarlet, MD  hydrochlorothiazide (HYDRODIURIL) 25 MG tablet Take 1 tablet (25 mg total) by mouth daily. 07/01/12 07/01/13 Yes Salley Scarlet, MD  Multiple Vitamin (MULITIVITAMIN WITH MINERALS) TABS Take 1 tablet by mouth daily.   Yes Historical Provider, MD  omeprazole (PRILOSEC) 20 MG capsule Take 20 mg by mouth daily.   Yes Historical Provider, MD  traZODone (DESYREL) 100 MG tablet Take 100 mg by mouth at bedtime as needed. Sleep   Yes Historical Provider, MD  promethazine (PHENERGAN) 12.5 MG tablet Take 1 tablet (12.5 mg total) by mouth every 6 (six) hours as needed for nausea. 09/22/12   Salley Scarlet, MD    Allergies:  Allergies  Allergen Reactions  . Penicillins Itching    Past Medical History: Past Medical History  Diagnosis Date  . Bronchitis   . Acid reflux   . Pancreatitis   . Gout   . Bipolar disorder   .  Hyperlipidemia   . Alcohol abuse   . Pseudocyst of pancreas 02/28/2012  . Arthritis   . Fracture of lower leg     Past Surgical History  Procedure Date  . Nasal suregery     broken nose  . Circumcision 03/17/2012    Procedure: CIRCUMCISION ADULT;  Surgeon: Ky Barban, MD;  Location: [facility];  Service: Urology;  Laterality: N/A;  . Right leg     Social History:  reports that he has been smoking Cigarettes.  He has a 12.5 pack-year smoking history. He does not have any smokeless tobacco history on file. He reports that he drinks about 29.4 ounces of alcohol per  week. He reports that he does not use illicit drugs.  Family History:  Family History  Problem Relation Age of Onset  . Diabetes Father   . Anesthesia problems Neg Hx   . Hypotension Neg Hx   . Malignant hyperthermia Neg Hx   . Pseudochol deficiency Neg Hx     Review of Systems - History obtained from the patient General ROS: positive for  - fatigue Psychological ROS: negative Ophthalmic ROS: negative ENT ROS: negative Allergy and Immunology ROS: negative Hematological and Lymphatic ROS: negative Endocrine ROS: negative Respiratory ROS: no cough, shortness of breath, or wheezing Cardiovascular ROS: no chest pain or dyspnea on exertion Gastrointestinal ROS: as in hpi Genito-Urinary ROS: positive for - difficulty initiating urination Musculoskeletal ROS: positive for - chronic back pain Neurological ROS: no TIA or stroke symptoms Dermatological ROS: negative  Physical Examination Blood pressure 146/96, pulse 79, temperature 98.3 F (36.8 C), temperature source Oral, resp. rate 20, height 5\' 10"  (1.778 m), weight 81.647 kg (180 lb), SpO2 97.00%.  General appearance: alert, cooperative, appears stated age and no distress Head: Normocephalic, without obvious abnormality, atraumatic Eyes: conjunctivae/corneas clear. PERRL, EOM's intact.  Throat: lips, mucosa, and tongue normal; teeth and gums normal Neck: no adenopathy, no carotid bruit, no JVD, supple, symmetrical, trachea midline and thyroid not enlarged, symmetric, no tenderness/mass/nodules Resp: clear to auscultation bilaterally Cardio: regular rate and rhythm, S1, S2 normal, no murmur, click, rub or gallop GI: Abdomen is soft. Tenderness is present in the epigastrium and RUQ. Without any rebound, rigidity, or guarding. No masses, organomegaly appreciated. Bowel sounds are present. Extremities: extremities normal, atraumatic, no cyanosis or edema Pulses: 2+ and symmetric Skin: Skin color, texture, turgor normal. No rashes or  lesions Lymph nodes: Cervical, supraclavicular, and axillary nodes normal. Neurologic: Alert and oriented x 3. No focal deficits.  Laboratory Data: Results for orders placed during the hospital encounter of 10/02/12 (from the past 48 hour(s))  CBC WITH DIFFERENTIAL     Status: Abnormal   Collection Time   10/02/12  5:35 PM      Component Value Range Comment   WBC 5.2  4.0 - 10.5 K/uL    RBC 4.55  4.22 - 5.81 MIL/uL    Hemoglobin 15.8  13.0 - 17.0 g/dL    HCT 16.1  09.6 - 04.5 %    MCV 95.6  78.0 - 100.0 fL    MCH 34.7 (*) 26.0 - 34.0 pg    MCHC 36.3 (*) 30.0 - 36.0 g/dL    RDW 40.9  81.1 - 91.4 %    Platelets 389  150 - 400 K/uL    Neutrophils Relative 57  43 - 77 %    Lymphocytes Relative 34  12 - 46 %    Monocytes Relative 7  3 - 12 %  Eosinophils Relative 2  0 - 5 %    Basophils Relative 0  0 - 1 %    Band Neutrophils 0  0 - 10 %    Metamyelocytes Relative 0      Myelocytes 0      Promyelocytes Absolute 0      Blasts 0      nRBC 0  0 /100 WBC    Neutro Abs 2.9  1.7 - 7.7 K/uL    Lymphs Abs 1.8  0.7 - 4.0 K/uL    Monocytes Absolute 0.4  0.1 - 1.0 K/uL    Eosinophils Absolute 0.1  0.0 - 0.7 K/uL    Basophils Absolute 0.0  0.0 - 0.1 K/uL   BASIC METABOLIC PANEL     Status: Abnormal   Collection Time   10/02/12  5:35 PM      Component Value Range Comment   Sodium 130 (*) 135 - 145 mEq/L    Potassium 3.8  3.5 - 5.1 mEq/L    Chloride 92 (*) 96 - 112 mEq/L    CO2 25  19 - 32 mEq/L    Glucose, Bld 83  70 - 99 mg/dL    BUN 13  6 - 23 mg/dL    Creatinine, Ser 8.11  0.50 - 1.35 mg/dL    Calcium 8.7  8.4 - 91.4 mg/dL    GFR calc non Af Amer >90  >90 mL/min    GFR calc Af Amer >90  >90 mL/min   HEPATIC FUNCTION PANEL     Status: Abnormal   Collection Time   10/02/12  5:35 PM      Component Value Range Comment   Total Protein 7.2  6.0 - 8.3 g/dL    Albumin 3.5  3.5 - 5.2 g/dL    AST 782 (*) 0 - 37 U/L    ALT 33  0 - 53 U/L CORRECTED FOR LIPEMIA   Alkaline Phosphatase  68  39 - 117 U/L    Total Bilirubin 0.3  0.3 - 1.2 mg/dL    Bilirubin, Direct 0.1  0.0 - 0.3 mg/dL    Indirect Bilirubin 0.2 (*) 0.3 - 0.9 mg/dL   LIPASE, BLOOD     Status: Normal   Collection Time   10/02/12  5:35 PM      Component Value Range Comment   Lipase 35  11 - 59 U/L   URINALYSIS, ROUTINE W REFLEX MICROSCOPIC     Status: Abnormal   Collection Time   10/02/12  5:36 PM      Component Value Range Comment   Color, Urine YELLOW  YELLOW    APPearance CLEAR  CLEAR    Specific Gravity, Urine 1.025  1.005 - 1.030    pH 6.5  5.0 - 8.0    Glucose, UA NEGATIVE  NEGATIVE mg/dL    Hgb urine dipstick NEGATIVE  NEGATIVE    Bilirubin Urine NEGATIVE  NEGATIVE    Ketones, ur 15 (*) NEGATIVE mg/dL    Protein, ur NEGATIVE  NEGATIVE mg/dL    Urobilinogen, UA 0.2  0.0 - 1.0 mg/dL    Nitrite NEGATIVE  NEGATIVE    Leukocytes, UA NEGATIVE  NEGATIVE MICROSCOPIC NOT DONE ON URINES WITH NEGATIVE PROTEIN, BLOOD, LEUKOCYTES, NITRITE, OR GLUCOSE <1000 mg/dL.    Radiology Reports: Ct Abdomen Pelvis W Contrast  10/02/2012  *RADIOLOGY REPORT*  Clinical Data: Abdominal pain.  Nausea and vomiting.  Personal history of pancreatitis.  CT ABDOMEN AND PELVIS WITH CONTRAST  Technique:  Multidetector CT imaging of the abdomen and pelvis was performed following the standard protocol during bolus administration of intravenous contrast.  Contrast: OMNIPAQUE IOHEXOL 300 MG/ML  SOLN  Comparison: 12/10/2011  Findings: Increased hepatic steatosis noted, but no liver masses are identified.  Gallbladder is unremarkable.  The pancreas is normal appearance, without evidence of pancreatic mass or peripancreatic inflammatory changes.  No abnormal fluid collections are seen.  Chronic splenic vein thrombosis again noted, however spleen is normal in size in appearance.  The adrenal glands and kidneys are normal appearance.  No soft tissue masses or lymphadenopathy identified within the abdomen or pelvis.  No evidence of  inflammatory process or abnormal fluid collections.  Unopacified bowel loops are unremarkable appearance. No hernia identified.  IMPRESSION:  1.  No signs of acute pancreatitis or other acute findings. 2.  Mildly increased hepatic steatosis noted. 3.  Chronic splenic vein thrombosis.   Original Report Authenticated By: Danae Orleans, M.D.      Assessment/Plan  Principal Problem:  *Acute gastritis without bleeding Active Problems:  GERD (gastroesophageal reflux disease)  Alcohol abuse  Chronic pancreatitis  Essential hypertension, benign  Transaminitis  Hyponatremia  Dehydration  Chronic back pain   #1 abdominal pain with nausea, vomiting most likely from Acute Gastritis: Differential diagnoses include symptoms secondary to chronic pancreatitis, acute gastritis. He'll be given symptomatic treatment. He'll be observed in the hospital. Proton pump inhibitors will be provided. His lipase is normal and his CT scan does not show any acute intra-abdominal pathology. Continue to monitor him.  #2 elevated AST: It's most likely secondary to alcohol intake. We'll repeat his LFTs in the morning. There is evidence for hepatic steatosis on the CT scan.  #3 alcohol abuse: He has significant alcohol abuse issues, which is probably contributing to his present symptoms. His last alcohol intake was yesterday. Will put him on the alcohol withdrawal protocol.  #4 hyponatremia and dehydration: Will give him IV fluids. We'll hold his diuretic agent.  #5 history of hypertension: Monitor blood pressures closely.  #6 history of chronic back pain: Continue to monitor closely. He is on Percocet at home and is on a pain contract with his PCP.  #7 splenic vein thrombosis: This was noted on the CT scan. However, this appears to be chronic. And, this is unlikely to be playing a role in his current presentation.  He is a full code.  DVT, prophylaxis will be initiated.  Further management decisions will depend on  results of further testing and patient's response to treatment.   [facility]  [facility] Pager [phone]  10/02/2012, 9:29 PM

## 2012-10-03 DIAGNOSIS — J309 Allergic rhinitis, unspecified: Secondary | ICD-10-CM | POA: Diagnosis present

## 2012-10-03 DIAGNOSIS — R1110 Vomiting, unspecified: Secondary | ICD-10-CM

## 2012-10-03 DIAGNOSIS — E876 Hypokalemia: Secondary | ICD-10-CM | POA: Diagnosis present

## 2012-10-03 DIAGNOSIS — R109 Unspecified abdominal pain: Secondary | ICD-10-CM

## 2012-10-03 LAB — COMPREHENSIVE METABOLIC PANEL
ALT: 31 U/L (ref 0–53)
AST: 59 U/L — ABNORMAL HIGH (ref 0–37)
Albumin: 2.9 g/dL — ABNORMAL LOW (ref 3.5–5.2)
Alkaline Phosphatase: 58 U/L (ref 39–117)
BUN: 8 mg/dL (ref 6–23)
CO2: 26 mEq/L (ref 19–32)
Calcium: 7.9 mg/dL — ABNORMAL LOW (ref 8.4–10.5)
Chloride: 94 mEq/L — ABNORMAL LOW (ref 96–112)
Creatinine, Ser: 0.75 mg/dL (ref 0.50–1.35)
GFR calc Af Amer: >90 mL/min (ref >90)
GFR calc non Af Amer: >90 mL/min (ref >90)
Glucose, Bld: 89 mg/dL (ref 70–99)
Potassium: 2.7 mEq/L — CL (ref 3.5–5.1)
Sodium: 131 mEq/L — ABNORMAL LOW (ref 135–145)
Total Bilirubin: 0.4 mg/dL (ref 0.3–1.2)
Total Protein: 6.1 g/dL (ref 6.0–8.3)

## 2012-10-03 LAB — CBC
HCT: 39.6 % (ref 39.0–52.0)
Hemoglobin: 13.6 g/dL (ref 13.0–17.0)
MCH: 33.2 pg (ref 26.0–34.0)
MCHC: 34.3 g/dL (ref 30.0–36.0)
MCV: 96.6 fL (ref 78.0–100.0)
Platelets: 332 10*3/uL (ref 150–400)
RBC: 4.1 MIL/uL — ABNORMAL LOW (ref 4.22–5.81)
RDW: 13.8 % (ref 11.5–15.5)
WBC: 5.5 10*3/uL (ref 4.0–10.5)

## 2012-10-03 LAB — MAGNESIUM: Magnesium: 1.8 mg/dL (ref 1.5–2.5)

## 2012-10-03 MED ORDER — PROMETHAZINE HCL 12.5 MG PO TABS: 12.5000 mg | ORAL_TABLET | Freq: Four times a day (QID) | ORAL | Status: DC | PRN

## 2012-10-03 MED ORDER — POTASSIUM CHLORIDE 10 MEQ/100ML IV SOLN
INTRAVENOUS | Status: AC
  Filled 2012-10-03: qty 100

## 2012-10-03 MED ORDER — PANTOPRAZOLE SODIUM 40 MG PO TBEC: 40.0000 mg | DELAYED_RELEASE_TABLET | Freq: Two times a day (BID) | ORAL | Status: DC

## 2012-10-03 MED ORDER — OXYCODONE-ACETAMINOPHEN 5-325 MG PO TABS
1.0000 | ORAL_TABLET | ORAL | Status: DC | PRN
  Administered 2012-10-03: 2 via ORAL
  Administered 2012-10-03: 1 via ORAL
  Filled 2012-10-03: qty 2
  Filled 2012-10-03: qty 1

## 2012-10-03 MED ORDER — POTASSIUM CHLORIDE IN NACL 20-0.9 MEQ/L-% IV SOLN
INTRAVENOUS | Status: DC
  Administered 2012-10-03: 07:00:00 via INTRAVENOUS

## 2012-10-03 MED ORDER — TAMSULOSIN HCL 0.4 MG PO CAPS
ORAL_CAPSULE | ORAL | Status: AC
  Filled 2012-10-03: qty 1

## 2012-10-03 MED ORDER — SODIUM CHLORIDE 0.9 % IJ SOLN
INTRAMUSCULAR | Status: AC
  Filled 2012-10-03: qty 3

## 2012-10-03 MED ORDER — OXYCODONE-ACETAMINOPHEN 5-325 MG PO TABS: 1.0000 | ORAL_TABLET | Freq: Three times a day (TID) | ORAL | Status: DC | PRN

## 2012-10-03 MED ORDER — POTASSIUM CHLORIDE 10 MEQ/100ML IV SOLN
10.0000 meq | Freq: Once | INTRAVENOUS | Status: AC
  Administered 2012-10-03: 10 meq via INTRAVENOUS
  Filled 2012-10-03: qty 100

## 2012-10-03 MED ORDER — POTASSIUM CHLORIDE 10 MEQ/100ML IV SOLN
10.0000 meq | INTRAVENOUS | Status: AC
  Administered 2012-10-03 (×3): 10 meq via INTRAVENOUS
  Filled 2012-10-03 (×2): qty 100

## 2012-10-03 MED ORDER — LORATADINE 10 MG PO TABS
10.0000 mg | ORAL_TABLET | Freq: Every day | ORAL | Status: DC
  Administered 2012-10-03: 10 mg via ORAL
  Filled 2012-10-03: qty 1

## 2012-10-03 MED ORDER — POTASSIUM CHLORIDE 10 MEQ/100ML IV SOLN: 10.0000 meq | INTRAVENOUS | Status: DC

## 2012-10-03 MED ORDER — POTASSIUM CHLORIDE CRYS ER 20 MEQ PO TBCR
40.0000 meq | EXTENDED_RELEASE_TABLET | Freq: Once | ORAL | Status: AC
  Administered 2012-10-03: 40 meq via ORAL
  Filled 2012-10-03: qty 2

## 2012-10-03 NOTE — Progress Notes (Signed)
Pt being d/c home. IV d/c. Pt given d/c instructions, follow up appt information, and prescriptions. Pt has no questions at this time and will follow up with PCP on Monday. D/c via wheelchair to car with fiance. Sheryn Bison

## 2012-10-03 NOTE — Progress Notes (Signed)
CRITICAL VALUE ALERT  Critical value received:  Potassium 2.7  Date of notification:  10/03/12  Time of notification:  0625  Critical value read back:yes  Nurse who received alert:  Lawson Fiscal RN  MD notified (1st page):  Barnie Del  Time of first page:  0630  MD notified (2nd page):  Time of second page:  Responding MD:  Barnie Del  Time MD responded:  [phone]

## 2012-10-03 NOTE — Progress Notes (Signed)
Chart reviewed. Patient known to me from previous hospitalization.  Subjective: Patient complaining of watery eyes, sinus congestion, requesting Allegra. No further vomiting. Admits to running out of Percocet at home yesterday. Abdominal Pain a little bit better. Admits to drinking, "but not as much as before". Had a single episode of diarrhea earlier this week.  Objective: Vital signs in last 24 hours: Filed Vitals:   10/02/12 2144 10/02/12 2224 10/02/12 2225 10/03/12 0633  BP: 138/92  138/93 142/92  Pulse: 74  71   Temp:   97.8 F (36.6 C) 97.7 F (36.5 C)  TempSrc: Oral  Oral Oral  Resp: 20  20 20   Height:   5\' 10"  (1.778 m)   Weight:   79.3 kg (174 lb 13.2 oz)   SpO2: 100% 98% 98% 98%   Weight change:   Intake/Output Summary (Last 24 hours) at 10/03/12 0952 Last data filed at 10/03/12 0637  Gross per 24 hour  Intake      0 ml  Output    900 ml  Net   -900 ml   General: Comfortable. Lying in bed. Lungs clear to auscultation bilaterally without wheeze rhonchi or rales Cardiovascular regular rate rhythm without murmurs gallops rubs Abdomen: Winces before I even placed a stethoscope on his abdomen. Soft. Voluntary guarding. Extremities no clubbing cyanosis or edema Neurologic: Alert oriented. No tremulousness.  Lab Results: Basic Metabolic Panel:  Lab 10/03/12 1610 10/02/12 1735  NA 131* 130*  K 2.7* 3.8  CL 94* 92*  CO2 26 25  GLUCOSE 89 83  BUN 8 13  CREATININE 0.75 0.73  CALCIUM 7.9* 8.7  MG -- --  PHOS -- --   Liver Function Tests:  Lab 10/03/12 0508 10/02/12 1735  AST 59* 103*  ALT 31 33  ALKPHOS 58 68  BILITOT 0.4 0.3  PROT 6.1 7.2  ALBUMIN 2.9* 3.5    Lab 10/02/12 1735  LIPASE 35  AMYLASE --   No results found for this basename: AMMONIA:2 in the last 168 hours CBC:  Lab 10/03/12 0508 10/02/12 1735  WBC 5.5 5.2  NEUTROABS -- 2.9  HGB 13.6 15.8  HCT 39.6 43.5  MCV 96.6 95.6  PLT 332 389   Cardiac Enzymes:  Lab 10/02/12 2117  CKTOTAL  --  CKMB --  CKMBINDEX --  TROPONINI <0.30   BNP: No results found for this basename: PROBNP:3 in the last 168 hours D-Dimer: No results found for this basename: DDIMER:2 in the last 168 hours CBG: No results found for this basename: GLUCAP:6 in the last 168 hours Hemoglobin A1C: No results found for this basename: HGBA1C in the last 168 hours Fasting Lipid Panel: No results found for this basename: CHOL,HDL,LDLCALC,TRIG,CHOLHDL,LDLDIRECT in the last 960 hours Thyroid Function Tests: No results found for this basename: TSH,T4TOTAL,FREET4,T3FREE,THYROIDAB in the last 168 hours Coagulation: No results found for this basename: LABPROT:4,INR:4 in the last 168 hours Anemia Panel: No results found for this basename: VITAMINB12,FOLATE,FERRITIN,TIBC,IRON,RETICCTPCT in the last 168 hours Urine Drug Screen: Drugs of Abuse     Component Value Date/Time   LABOPIA POS* 08/06/2012 1531   COCAINSCRNUR NEG 08/06/2012 1531   COCAINSCRNUR POSITIVE* 06/09/2009 1845   LABBENZ NEG 08/06/2012 1531   LABBENZ NONE DETECTED 06/09/2009 1845   AMPHETMU NEG 08/06/2012 1531   AMPHETMU NONE DETECTED 06/09/2009 1845   THCU NONE DETECTED 06/09/2009 1845   LABBARB  Value: NONE DETECTED        DRUG SCREEN FOR MEDICAL PURPOSES ONLY.  IF CONFIRMATION IS  NEEDED FOR ANY PURPOSE, NOTIFY LAB WITHIN 5 DAYS.        LOWEST DETECTABLE LIMITS FOR URINE DRUG SCREEN Drug Class       Cutoff (ng/mL) Amphetamine      1000 Barbiturate      200 Benzodiazepine   200 Tricyclics       300 Opiates          300 Cocaine          300 THC              50 06/09/2009 1845    Alcohol Level: No results found for this basename: ETH:2 in the last 168 hours Urinalysis:  Lab 10/02/12 1736  COLORURINE YELLOW  LABSPEC 1.025  PHURINE 6.5  GLUCOSEU NEGATIVE  HGBUR NEGATIVE  BILIRUBINUR NEGATIVE  KETONESUR 15*  PROTEINUR NEGATIVE  UROBILINOGEN 0.2  NITRITE NEGATIVE  LEUKOCYTESUR NEGATIVE   Micro Results: No results found for this or any  previous visit (from the past 240 hour(s)). Studies/Results: Ct Abdomen Pelvis W Contrast  10/02/2012  *RADIOLOGY REPORT*  Clinical Data: Abdominal pain.  Nausea and vomiting.  Personal history of pancreatitis.  CT ABDOMEN AND PELVIS WITH CONTRAST  Technique:  Multidetector CT imaging of the abdomen and pelvis was performed following the standard protocol during bolus administration of intravenous contrast.  Contrast: OMNIPAQUE IOHEXOL 300 MG/ML  SOLN  Comparison: 12/10/2011  Findings: Increased hepatic steatosis noted, but no liver masses are identified.  Gallbladder is unremarkable.  The pancreas is normal appearance, without evidence of pancreatic mass or peripancreatic inflammatory changes.  No abnormal fluid collections are seen.  Chronic splenic vein thrombosis again noted, however spleen is normal in size in appearance.  The adrenal glands and kidneys are normal appearance.  No soft tissue masses or lymphadenopathy identified within the abdomen or pelvis.  No evidence of inflammatory process or abnormal fluid collections.  Unopacified bowel loops are unremarkable appearance. No hernia identified.  IMPRESSION:  1.  No signs of acute pancreatitis or other acute findings. 2.  Mildly increased hepatic steatosis noted. 3.  Chronic splenic vein thrombosis.   Original Report Authenticated By: Danae Orleans, M.D.    Scheduled Meds:   . sodium chloride   Intravenous Once  . citalopram  20 mg Oral Daily  . folic acid  1 mg Oral Daily  . HYDROmorphone  2 mg Intravenous Once  . HYDROmorphone  2 mg Intravenous Once  . LORazepam  0-4 mg Oral Q6H   Followed by  . LORazepam  0-4 mg Oral Q12H  . multivitamin with minerals  1 tablet Oral Daily  . ondansetron  4 mg Intravenous Once  . pantoprazole (PROTONIX) IV  40 mg Intravenous Once  . pantoprazole (PROTONIX) IV  40 mg Intravenous Q12H  . potassium chloride  10 mEq Intravenous Q1 Hr x 4  . potassium chloride      . potassium chloride  40 mEq Oral  Once  . Tamsulosin HCl  0.4 mg Oral QHS  . thiamine  100 mg Oral Daily   Or  . thiamine  100 mg Intravenous Daily  . DISCONTD: potassium chloride  10 mEq Intravenous Q1 Hr x 4   Continuous Infusions:   . 0.9 % NaCl with KCl 20 mEq / L 100 mL/hr at 10/03/12 0657  . DISCONTD: sodium chloride 100 mL/hr at 10/02/12 2232   PRN Meds:.acetaminophen, acetaminophen, albuterol, cyclobenzaprine, HYDROmorphone (DILAUDID) injection, iohexol, LORazepam, LORazepam, ondansetron (ZOFRAN) IV, ondansetron, DISCONTD: albuterol Assessment/Plan: Principal Problem:  *  Vomiting Active Problems:  Essential hypertension, benign  Hyponatremia  Dehydration  Hypokalemia  GERD (gastroesophageal reflux disease)  Alcohol abuse  Transaminitis  Chronic back pain  Allergic rhinitis  Patient has had pancreatitis related to alcohol and hypertriglyceridemia in the past, but does not have chronic pancreatitis, as confirmed by CAT scan findings. Vomiting may be related to opiate withdrawal. Versus alcoholic gastritis. Again, as in the past, I encouraged the patient to abstain completely from alcohol. He minimizes his alcohol intake and this does not appear to accept the fact that alcohol is causing his GI symptoms, and recurrent hospitalizations. Patient is tolerating liquids. Will advance diet. Change medications to by mouth. If he tolerates lunch, can be discharged home.  No evidence of alcohol withdrawal.  Rhinorrhea, watery eyes may be related to opiate withdrawal, but could be allergies as well. Will resume antihistamines.  Potassium is being repleted IV and by mouth.   LOS: 1 day   Ellina Sivertsen L 10/03/2012, 9:52 AM

## 2012-10-03 NOTE — Discharge Summary (Signed)
Physician Discharge Summary  Patient ID: [name] [name] MRN: [id number] DOB/AGE: 1961-01-12 51 y.o.  Admit date: 10/02/2012 Discharge date: 10/03/2012  Discharge Diagnoses:  Principal Problem:  *Vomiting, gastritis versus opiate withdrawal. Active Problems:  Essential hypertension, benign  Hyponatremia  Dehydration  Hypokalemia  GERD (gastroesophageal reflux disease)  Alcohol abuse  Transaminitis  Chronic back pain  Allergic rhinitis     Medication List     As of 10/03/2012  2:22 PM    STOP taking these medications         hydrochlorothiazide 25 MG tablet   Commonly known as: HYDRODIURIL      multivitamin with minerals Tabs      TAKE these medications         albuterol 108 (90 BASE) MCG/ACT inhaler   Commonly known as: PROVENTIL HFA;VENTOLIN HFA   Inhale 2 puffs into the lungs every 4 (four) hours as needed for wheezing.      CELEXA 20 MG tablet   Generic drug: citalopram   Take 1 tablet (20 mg total) by mouth daily.      cyclobenzaprine 10 MG tablet   Commonly known as: FLEXERIL   Take 10 mg by mouth 3 (three) times daily as needed. For muscle spasms      fexofenadine 180 MG tablet   Commonly known as: ALLEGRA   Take 180 mg by mouth daily.      fish oil-omega-3 fatty acids 1000 MG capsule   Take 1 g by mouth 3 (three) times daily.      gemfibrozil 600 MG tablet   Commonly known as: LOPID   Take 1 tablet (600 mg total) by mouth 2 (two) times daily before a meal. FOR TREATMENT OF YOUR HIGH TRIGLYCERIDE LEVEL.      omeprazole 20 MG capsule   Commonly known as: PRILOSEC   Take 20 mg by mouth daily.      oxyCODONE-acetaminophen 5-325 MG per tablet   Commonly known as: PERCOCET/ROXICET   Take 1-2 tablets by mouth every 8 (eight) hours as needed for pain.      promethazine 12.5 MG tablet   Commonly known as: PHENERGAN   Take 1 tablet (12.5 mg total) by mouth every 6 (six) hours as needed for nausea.      traZODone 100 MG tablet   Commonly known as:  DESYREL   Take 100 mg by mouth at bedtime as needed. Sleep            Discharge Orders    Future Appointments: Provider: Department: Dept Phone: Center:   10/05/2012 8:00 AM Salley Scarlet, MD [facility] [phone] [facility]     Future Orders Please Complete By Expires   Diet - low sodium heart healthy      Discharge instructions      Comments:   Hold hydrochlorothiazide and vitamins until nausea resolves. No alcohol   Activity as tolerated - No restrictions         Follow-up Information    Follow up with Milinda Antis, MD. In 1 week.   Contact information:   [address], [address] [place] [address] [phone]          Disposition: 01-Home or Self Care  Discharged Condition: stable  Consults:  social work  Labs:   Results for orders placed during the hospital encounter of 10/02/12 (from the past 48 hour(s))  CBC WITH DIFFERENTIAL     Status: Abnormal   Collection Time   10/02/12  5:35  PM      Component Value Range Comment   WBC 5.2  4.0 - 10.5 K/uL    RBC 4.55  4.22 - 5.81 MIL/uL    Hemoglobin 15.8  13.0 - 17.0 g/dL    HCT 45.4  09.8 - 11.9 %    MCV 95.6  78.0 - 100.0 fL    MCH 34.7 (*) 26.0 - 34.0 pg    MCHC 36.3 (*) 30.0 - 36.0 g/dL    RDW 14.7  82.9 - 56.2 %    Platelets 389  150 - 400 K/uL    Neutrophils Relative 57  43 - 77 %    Lymphocytes Relative 34  12 - 46 %    Monocytes Relative 7  3 - 12 %    Eosinophils Relative 2  0 - 5 %    Basophils Relative 0  0 - 1 %    Band Neutrophils 0  0 - 10 %    Metamyelocytes Relative 0      Myelocytes 0      Promyelocytes Absolute 0      Blasts 0      nRBC 0  0 /100 WBC    Neutro Abs 2.9  1.7 - 7.7 K/uL    Lymphs Abs 1.8  0.7 - 4.0 K/uL    Monocytes Absolute 0.4  0.1 - 1.0 K/uL    Eosinophils Absolute 0.1  0.0 - 0.7 K/uL    Basophils Absolute 0.0  0.0 - 0.1 K/uL   BASIC METABOLIC PANEL     Status: Abnormal   Collection Time   10/02/12  5:35 PM      Component Value Range Comment    Sodium 130 (*) 135 - 145 mEq/L    Potassium 3.8  3.5 - 5.1 mEq/L    Chloride 92 (*) 96 - 112 mEq/L    CO2 25  19 - 32 mEq/L    Glucose, Bld 83  70 - 99 mg/dL    BUN 13  6 - 23 mg/dL    Creatinine, Ser 1.30  0.50 - 1.35 mg/dL    Calcium 8.7  8.4 - 86.5 mg/dL    GFR calc non Af Amer >90  >90 mL/min    GFR calc Af Amer >90  >90 mL/min   HEPATIC FUNCTION PANEL     Status: Abnormal   Collection Time   10/02/12  5:35 PM      Component Value Range Comment   Total Protein 7.2  6.0 - 8.3 g/dL    Albumin 3.5  3.5 - 5.2 g/dL    AST 784 (*) 0 - 37 U/L    ALT 33  0 - 53 U/L CORRECTED FOR LIPEMIA   Alkaline Phosphatase 68  39 - 117 U/L    Total Bilirubin 0.3  0.3 - 1.2 mg/dL    Bilirubin, Direct 0.1  0.0 - 0.3 mg/dL    Indirect Bilirubin 0.2 (*) 0.3 - 0.9 mg/dL   LIPASE, BLOOD     Status: Normal   Collection Time   10/02/12  5:35 PM      Component Value Range Comment   Lipase 35  11 - 59 U/L   URINALYSIS, ROUTINE W REFLEX MICROSCOPIC     Status: Abnormal   Collection Time   10/02/12  5:36 PM      Component Value Range Comment   Color, Urine YELLOW  YELLOW    APPearance CLEAR  CLEAR    Specific Gravity, Urine  1.025  1.005 - 1.030    pH 6.5  5.0 - 8.0    Glucose, UA NEGATIVE  NEGATIVE mg/dL    Hgb urine dipstick NEGATIVE  NEGATIVE    Bilirubin Urine NEGATIVE  NEGATIVE    Ketones, ur 15 (*) NEGATIVE mg/dL    Protein, ur NEGATIVE  NEGATIVE mg/dL    Urobilinogen, UA 0.2  0.0 - 1.0 mg/dL    Nitrite NEGATIVE  NEGATIVE    Leukocytes, UA NEGATIVE  NEGATIVE MICROSCOPIC NOT DONE ON URINES WITH NEGATIVE PROTEIN, BLOOD, LEUKOCYTES, NITRITE, OR GLUCOSE <1000 mg/dL.  TROPONIN I     Status: Normal   Collection Time   10/02/12  9:17 PM      Component Value Range Comment   Troponin I <0.30  <0.30 ng/mL   COMPREHENSIVE METABOLIC PANEL     Status: Abnormal   Collection Time   10/03/12  5:08 AM      Component Value Range Comment   Sodium 131 (*) 135 - 145 mEq/L    Potassium 2.7 (*) 3.5 - 5.1 mEq/L     Chloride 94 (*) 96 - 112 mEq/L    CO2 26  19 - 32 mEq/L    Glucose, Bld 89  70 - 99 mg/dL    BUN 8  6 - 23 mg/dL    Creatinine, Ser 1.61  0.50 - 1.35 mg/dL    Calcium 7.9 (*) 8.4 - 10.5 mg/dL    Total Protein 6.1  6.0 - 8.3 g/dL    Albumin 2.9 (*) 3.5 - 5.2 g/dL    AST 59 (*) 0 - 37 U/L LIPEMIC SPECIMEN   ALT 31  0 - 53 U/L    Alkaline Phosphatase 58  39 - 117 U/L LIPEMIC SPECIMEN   Total Bilirubin 0.4  0.3 - 1.2 mg/dL    GFR calc non Af Amer >90  >90 mL/min    GFR calc Af Amer >90  >90 mL/min   CBC     Status: Abnormal   Collection Time   10/03/12  5:08 AM      Component Value Range Comment   WBC 5.5  4.0 - 10.5 K/uL    RBC 4.10 (*) 4.22 - 5.81 MIL/uL    Hemoglobin 13.6  13.0 - 17.0 g/dL    HCT 09.6  04.5 - 40.9 %    MCV 96.6  78.0 - 100.0 fL    MCH 33.2  26.0 - 34.0 pg    MCHC 34.3  30.0 - 36.0 g/dL    RDW 81.1  91.4 - 78.2 %    Platelets 332  150 - 400 K/uL   MAGNESIUM     Status: Normal   Collection Time   10/03/12 10:15 AM      Component Value Range Comment   Magnesium 1.8  1.5 - 2.5 mg/dL     Diagnostics:  Ct Abdomen Pelvis W Contrast  10/02/2012  *RADIOLOGY REPORT*  Clinical Data: Abdominal pain.  Nausea and vomiting.  Personal history of pancreatitis.  CT ABDOMEN AND PELVIS WITH CONTRAST  Technique:  Multidetector CT imaging of the abdomen and pelvis was performed following the standard protocol during bolus administration of intravenous contrast.  Contrast: OMNIPAQUE IOHEXOL 300 MG/ML  SOLN  Comparison: 12/10/2011  Findings: Increased hepatic steatosis noted, but no liver masses are identified.  Gallbladder is unremarkable.  The pancreas is normal appearance, without evidence of pancreatic mass or peripancreatic inflammatory changes.  No abnormal fluid collections are seen.  Chronic  splenic vein thrombosis again noted, however spleen is normal in size in appearance.  The adrenal glands and kidneys are normal appearance.  No soft tissue masses or lymphadenopathy  identified within the abdomen or pelvis.  No evidence of inflammatory process or abnormal fluid collections.  Unopacified bowel loops are unremarkable appearance. No hernia identified.  IMPRESSION:  1.  No signs of acute pancreatitis or other acute findings. 2.  Mildly increased hepatic steatosis noted. 3.  Chronic splenic vein thrombosis.   Original Report Authenticated By: Danae Orleans, M.D.    Full Code   Hospital Course: See H&P for complete admission details. The patient is a 51 year old black male with a history of chronic pain and alcohol abuse. Previous episodes of pancreatitis related to alcohol and hypertriglyceridemia. He presented with vomiting and abdominal pain. In the ER, he had a blood pressure 146/96. Heart rate respiratory rate temperature were normal. He was in no distress. Abdomen was soft. Right upper quadrant and epigastric tenderness. He admitted to continued alcohol use. CBC was normal. Complete metabolic panel significant for sodium of 1:30. AST of 103. Otherwise normal liver function tests. Lipase normal. CAT scan of the abdomen showed no evidence of acute pancreatitis. Mildly increased hepatic steatosis. Chronic splenic vein thrombosis.  Patient was admitted. He was given supportive care. IV fluids. Anti-emetics. Pain medication. His symptoms quickly improved. He was encouraged to quit drinking altogether. I suspect his symptoms were related to opiate withdrawal. He admitted to having run out of Percocet. Alternately could be alcoholic gastritis. By time of discharge, he was tolerating a regular diet and feeling much better. Total time on the day of discharge greater than 30 minutes.  Discharge Exam:  See progress note  Signed: Micaiah Remillard L 10/03/2012, 2:22 PM

## 2012-10-05 ENCOUNTER — Ambulatory Visit (INDEPENDENT_AMBULATORY_CARE_PROVIDER_SITE_OTHER): Payer: Medicare Other | Admitting: Family Medicine

## 2012-10-05 ENCOUNTER — Encounter: Payer: Self-pay | Admitting: Family Medicine

## 2012-10-05 VITALS — BP 130/78 | HR 70 | Resp 18 | Ht 70.0 in | Wt 183.1 lb

## 2012-10-05 DIAGNOSIS — E876 Hypokalemia: Secondary | ICD-10-CM

## 2012-10-05 DIAGNOSIS — Z125 Encounter for screening for malignant neoplasm of prostate: Secondary | ICD-10-CM

## 2012-10-05 DIAGNOSIS — Z23 Encounter for immunization: Secondary | ICD-10-CM

## 2012-10-05 DIAGNOSIS — N400 Enlarged prostate without lower urinary tract symptoms: Secondary | ICD-10-CM

## 2012-10-05 DIAGNOSIS — J40 Bronchitis, not specified as acute or chronic: Secondary | ICD-10-CM

## 2012-10-05 DIAGNOSIS — R109 Unspecified abdominal pain: Secondary | ICD-10-CM

## 2012-10-05 DIAGNOSIS — K219 Gastro-esophageal reflux disease without esophagitis: Secondary | ICD-10-CM

## 2012-10-05 LAB — BASIC METABOLIC PANEL
BUN: 5 mg/dL — ABNORMAL LOW (ref 6–23)
CO2: 28 mEq/L (ref 19–32)
Calcium: 8.9 mg/dL (ref 8.4–10.5)
Chloride: 104 mEq/L (ref 96–112)
Creat: 0.84 mg/dL (ref 0.50–1.35)
Glucose, Bld: 87 mg/dL (ref 70–99)
Potassium: 3.7 mEq/L (ref 3.5–5.3)
Sodium: 139 mEq/L (ref 135–145)

## 2012-10-05 LAB — PSA, MEDICARE: PSA: 0.45 ng/mL (ref <=4.00)

## 2012-10-05 MED ORDER — TAMSULOSIN HCL 0.4 MG PO CAPS: 0.4000 mg | ORAL_CAPSULE | Freq: Every day | ORAL | Status: DC

## 2012-10-05 MED ORDER — FEXOFENADINE HCL 180 MG PO TABS: 180.0000 mg | ORAL_TABLET | Freq: Every day | ORAL | Status: DC

## 2012-10-05 MED ORDER — CYCLOBENZAPRINE HCL 10 MG PO TABS: 10.0000 mg | ORAL_TABLET | Freq: Three times a day (TID) | ORAL | Status: DC | PRN

## 2012-10-05 MED ORDER — PANTOPRAZOLE SODIUM 40 MG PO TBEC: 40.0000 mg | DELAYED_RELEASE_TABLET | Freq: Every day | ORAL | Status: DC

## 2012-10-05 NOTE — Progress Notes (Signed)
  Subjective:    Patient ID: [name] [name], male    DOB: 1961-03-14, 51 y.o.   MRN: [id number]  HPI Patient presents to followup 24 hour condition for nausea and vomiting thought to be secondary to gastritis.he was also noted to be severely hypokalemic and required IV potassium. Marland Kitchen He states his omeprazole was not helping him and his pain has improved he is no longer vomiting.He continues a slow down on his alcohol intake. He would like a refill on his Flomax as he is having urinary problems without it.  Continues to use tussin and albuterol inhaler for cough, has finished antibiotics   Review of Systems  GEN- denies fatigue, fever, weight loss,weakness, +recent illness, + decreased appetite HEENT- denies eye drainage, change in vision, nasal discharge, CVS- denies chest pain, palpitations RESP- denies SOB, +cough, wheeze ABD- denies N/V, change in stools, +abd pain GU- denies dysuria, hematuria, dribbling, incontinence MSK- denies joint pain, muscle aches, injury Neuro- denies headache, dizziness, syncope, seizure activity      Objective:   Physical Exam GEN- NAD, alert and oriented x3 HEENT- PERRL, EOMI, non injected sclera, pink conjunctiva, MMM, oropharynx clear, TM clear bilat  Neck- Supple, no LAD CVS- RRR, no murmur RESP-CTAB, no wheeze, normal WOB ABD-NABS,soft, mild TTP upper quadrants , no rebound, no guarding EXT- No edema Pulses- Radial, DP- 2+       Assessment & Plan:

## 2012-10-05 NOTE — Patient Instructions (Addendum)
Flexeril refilled Change acid medicine to protonix once a day Allegra refilled Flu shot given Restart flomax Labs for potassium and prostate Reschedule with neurosurgery for your back Continue to work on the drinking Only use inhaler for shortness of breath or wheezing F/U 3 months

## 2012-10-07 DIAGNOSIS — N400 Enlarged prostate without lower urinary tract symptoms: Secondary | ICD-10-CM | POA: Insufficient documentation

## 2012-10-07 NOTE — Assessment & Plan Note (Signed)
Residual cough, exam looks good, discussed how and when to use albuterol inhaler

## 2012-10-07 NOTE — Assessment & Plan Note (Signed)
He has used prilosec, omeprazole OTC, H2 blockers and now worsening symptoms, change to protonix

## 2012-10-07 NOTE — Assessment & Plan Note (Signed)
Potassium to be rechecked

## 2012-10-07 NOTE — Assessment & Plan Note (Signed)
Continue flomax, PSA wnl

## 2012-10-07 NOTE — Assessment & Plan Note (Signed)
Treated for gastritis, now improved, no signs of pancreatitis flare currently

## 2012-10-16 ENCOUNTER — Emergency Department (HOSPITAL_COMMUNITY)
Admission: EM | Admit: 2012-10-16 | Discharge: 2012-10-16 | Disposition: A | Discharge status: Home / Self Care | Payer: Medicare Other | Attending: Emergency Medicine | Admitting: Emergency Medicine

## 2012-10-16 ENCOUNTER — Emergency Department (HOSPITAL_COMMUNITY): Payer: Medicare Other

## 2012-10-16 ENCOUNTER — Encounter (HOSPITAL_COMMUNITY): Payer: Self-pay

## 2012-10-16 DIAGNOSIS — Z87828 Personal history of other (healed) physical injury and trauma: Secondary | ICD-10-CM | POA: Insufficient documentation

## 2012-10-16 DIAGNOSIS — Y929 Unspecified place or not applicable: Secondary | ICD-10-CM | POA: Insufficient documentation

## 2012-10-16 DIAGNOSIS — T300 Burn of unspecified body region, unspecified degree: Secondary | ICD-10-CM | POA: Insufficient documentation

## 2012-10-16 DIAGNOSIS — Z8739 Personal history of other diseases of the musculoskeletal system and connective tissue: Secondary | ICD-10-CM | POA: Insufficient documentation

## 2012-10-16 DIAGNOSIS — J40 Bronchitis, not specified as acute or chronic: Secondary | ICD-10-CM | POA: Insufficient documentation

## 2012-10-16 DIAGNOSIS — Z791 Long term (current) use of non-steroidal anti-inflammatories (NSAID): Secondary | ICD-10-CM | POA: Insufficient documentation

## 2012-10-16 DIAGNOSIS — F17200 Nicotine dependence, unspecified, uncomplicated: Secondary | ICD-10-CM | POA: Insufficient documentation

## 2012-10-16 DIAGNOSIS — E785 Hyperlipidemia, unspecified: Secondary | ICD-10-CM | POA: Insufficient documentation

## 2012-10-16 DIAGNOSIS — S6990XA Unspecified injury of unspecified wrist, hand and finger(s), initial encounter: Secondary | ICD-10-CM | POA: Insufficient documentation

## 2012-10-16 DIAGNOSIS — S6980XA Other specified injuries of unspecified wrist, hand and finger(s), initial encounter: Secondary | ICD-10-CM | POA: Insufficient documentation

## 2012-10-16 DIAGNOSIS — Z23 Encounter for immunization: Secondary | ICD-10-CM | POA: Insufficient documentation

## 2012-10-16 DIAGNOSIS — IMO0002 Reserved for concepts with insufficient information to code with codable children: Secondary | ICD-10-CM

## 2012-10-16 DIAGNOSIS — W230XXA Caught, crushed, jammed, or pinched between moving objects, initial encounter: Secondary | ICD-10-CM | POA: Insufficient documentation

## 2012-10-16 DIAGNOSIS — Y939 Activity, unspecified: Secondary | ICD-10-CM | POA: Insufficient documentation

## 2012-10-16 DIAGNOSIS — Z79899 Other long term (current) drug therapy: Secondary | ICD-10-CM | POA: Insufficient documentation

## 2012-10-16 DIAGNOSIS — M129 Arthropathy, unspecified: Secondary | ICD-10-CM | POA: Insufficient documentation

## 2012-10-16 DIAGNOSIS — K219 Gastro-esophageal reflux disease without esophagitis: Secondary | ICD-10-CM | POA: Insufficient documentation

## 2012-10-16 DIAGNOSIS — F309 Manic episode, unspecified: Secondary | ICD-10-CM | POA: Insufficient documentation

## 2012-10-16 MED ORDER — TETANUS-DIPHTH-ACELL PERTUSSIS 5-2.5-18.5 LF-MCG/0.5 IM SUSP
0.5000 mL | Freq: Once | INTRAMUSCULAR | Status: AC
  Administered 2012-10-16: 0.5 mL via INTRAMUSCULAR
  Filled 2012-10-16: qty 0.5

## 2012-10-16 MED ORDER — IBUPROFEN 600 MG PO TABS: 600.0000 mg | ORAL_TABLET | Freq: Four times a day (QID) | ORAL | Status: DC | PRN

## 2012-10-16 MED ORDER — IBUPROFEN 800 MG PO TABS
800.0000 mg | ORAL_TABLET | Freq: Once | ORAL | Status: AC
  Administered 2012-10-16: 800 mg via ORAL
  Filled 2012-10-16: qty 1

## 2012-10-16 MED ORDER — BACITRACIN-NEOMYCIN-POLYMYXIN 400-5-5000 EX OINT
TOPICAL_OINTMENT | Freq: Once | CUTANEOUS | Status: AC
  Administered 2012-10-16: 2 via TOPICAL
  Filled 2012-10-16: qty 2
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
## 2012-10-16 NOTE — ED Notes (Signed)
Pt  Back from xray. Fresh ice pack provided. NAD.
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
## 2012-10-19 ENCOUNTER — Encounter: Payer: Self-pay | Admitting: Family Medicine

## 2012-10-19 ENCOUNTER — Ambulatory Visit (INDEPENDENT_AMBULATORY_CARE_PROVIDER_SITE_OTHER): Payer: Medicare Other | Admitting: Family Medicine

## 2012-10-19 VITALS — BP 128/98 | HR 103 | Resp 15 | Ht 70.0 in | Wt 179.4 lb

## 2012-10-19 DIAGNOSIS — I10 Essential (primary) hypertension: Secondary | ICD-10-CM

## 2012-10-19 DIAGNOSIS — N400 Enlarged prostate without lower urinary tract symptoms: Secondary | ICD-10-CM

## 2012-10-19 DIAGNOSIS — M542 Cervicalgia: Secondary | ICD-10-CM

## 2012-10-19 DIAGNOSIS — N529 Male erectile dysfunction, unspecified: Secondary | ICD-10-CM

## 2012-10-19 DIAGNOSIS — G8929 Other chronic pain: Secondary | ICD-10-CM

## 2012-10-19 DIAGNOSIS — S61219A Laceration without foreign body of unspecified finger without damage to nail, initial encounter: Secondary | ICD-10-CM

## 2012-10-19 DIAGNOSIS — M549 Dorsalgia, unspecified: Secondary | ICD-10-CM

## 2012-10-19 DIAGNOSIS — IMO0002 Reserved for concepts with insufficient information to code with codable children: Secondary | ICD-10-CM

## 2012-10-19 MED ORDER — KETOROLAC TROMETHAMINE 30 MG/ML IJ SOLN
30.0000 mg | Freq: Once | INTRAMUSCULAR | Status: AC
  Administered 2012-10-19: 30 mg via INTRAMUSCULAR

## 2012-10-19 MED ORDER — AMLODIPINE BESYLATE 5 MG PO TABS: 5.0000 mg | ORAL_TABLET | Freq: Every day | ORAL | Status: DC

## 2012-10-19 MED ORDER — TADALAFIL 5 MG PO TABS: 5.0000 mg | ORAL_TABLET | Freq: Every day | ORAL | Status: DC | PRN

## 2012-10-19 MED ORDER — OXYCODONE-ACETAMINOPHEN 7.5-325 MG PO TABS: 1.0000 | ORAL_TABLET | Freq: Four times a day (QID) | ORAL | Status: DC | PRN
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
## 2012-10-19 NOTE — Assessment & Plan Note (Signed)
Healing well, given bacitracin to place on wound, keep clean.

## 2012-10-19 NOTE — Assessment & Plan Note (Signed)
Trial of cialsis, he has seen urology in the past

## 2012-10-19 NOTE — Assessment & Plan Note (Signed)
No medications taken today. Will stop HCTZ and switch him to Norvasc 5 mg

## 2012-10-19 NOTE — Assessment & Plan Note (Signed)
Doing well on Flomax. He is having some problems with erectile dysfunction as well which he was afraid to bring up to me. We will give him a trial of Cialis 5 mg as he has both problems. He was given a 30 day card for free trial

## 2012-10-19 NOTE — Assessment & Plan Note (Signed)
msk pain, muscle relaxant, toradol 30mg  IM given

## 2012-10-19 NOTE — Assessment & Plan Note (Signed)
Unchanged, give #60 tablets, at 7.5mg  percocet, hopefully will keep him out of ER until neurosurgery appt

## 2012-10-19 NOTE — Assessment & Plan Note (Signed)
Mild swelling but fair ROM, tendons in tact, neg xray, hands unclean, discussed importance of keeping clean to avoid infection

## 2012-10-19 NOTE — Patient Instructions (Signed)
Pain medication refilled Shot given Use heating pad and flexeril for neck  Use bacitracin twice a day for 1 week on leg and thumb Keep both wounds clean New blood pressure medication  Stop HCTZ Trial of Cialis Keep previous f/u appt

## 2012-10-19 NOTE — Addendum Note (Signed)
Addended by: Abner Greenspan on: 10/19/2012 12:45 PM   Modules accepted: Orders

## 2012-10-22 NOTE — ED Provider Notes (Signed)
Medical screening examination/treatment/procedure(s) were performed by non-physician practitioner and as supervising physician I was immediately available for consultation/collaboration.   Laray Anger, DO 10/22/12 2103

## 2012-11-02 ENCOUNTER — Encounter (HOSPITAL_COMMUNITY): Payer: Self-pay | Admitting: *Deleted

## 2012-11-02 ENCOUNTER — Emergency Department (HOSPITAL_COMMUNITY)
Admission: EM | Admit: 2012-11-02 | Discharge: 2012-11-02 | Disposition: A | Discharge status: Home / Self Care | Payer: Medicare Other | Attending: Emergency Medicine | Admitting: Emergency Medicine

## 2012-11-02 ENCOUNTER — Emergency Department (HOSPITAL_COMMUNITY): Payer: Medicare Other

## 2012-11-02 DIAGNOSIS — Z8781 Personal history of (healed) traumatic fracture: Secondary | ICD-10-CM | POA: Insufficient documentation

## 2012-11-02 DIAGNOSIS — Z8719 Personal history of other diseases of the digestive system: Secondary | ICD-10-CM | POA: Insufficient documentation

## 2012-11-02 DIAGNOSIS — M79609 Pain in unspecified limb: Secondary | ICD-10-CM | POA: Insufficient documentation

## 2012-11-02 DIAGNOSIS — M129 Arthropathy, unspecified: Secondary | ICD-10-CM | POA: Insufficient documentation

## 2012-11-02 DIAGNOSIS — E785 Hyperlipidemia, unspecified: Secondary | ICD-10-CM | POA: Insufficient documentation

## 2012-11-02 DIAGNOSIS — Z8639 Personal history of other endocrine, nutritional and metabolic disease: Secondary | ICD-10-CM | POA: Insufficient documentation

## 2012-11-02 DIAGNOSIS — M79602 Pain in left arm: Secondary | ICD-10-CM

## 2012-11-02 DIAGNOSIS — Z8709 Personal history of other diseases of the respiratory system: Secondary | ICD-10-CM | POA: Insufficient documentation

## 2012-11-02 DIAGNOSIS — F319 Bipolar disorder, unspecified: Secondary | ICD-10-CM | POA: Insufficient documentation

## 2012-11-02 DIAGNOSIS — F17200 Nicotine dependence, unspecified, uncomplicated: Secondary | ICD-10-CM | POA: Insufficient documentation

## 2012-11-02 DIAGNOSIS — Z862 Personal history of diseases of the blood and blood-forming organs and certain disorders involving the immune mechanism: Secondary | ICD-10-CM | POA: Insufficient documentation

## 2012-11-02 DIAGNOSIS — K219 Gastro-esophageal reflux disease without esophagitis: Secondary | ICD-10-CM | POA: Insufficient documentation

## 2012-11-02 DIAGNOSIS — Z79899 Other long term (current) drug therapy: Secondary | ICD-10-CM | POA: Insufficient documentation

## 2012-11-02 MED ORDER — IBUPROFEN 800 MG PO TABS
800.0000 mg | ORAL_TABLET | Freq: Once | ORAL | Status: AC
  Administered 2012-11-02: 800 mg via ORAL
  Filled 2012-11-02: qty 1

## 2012-11-02 MED ORDER — MELOXICAM 7.5 MG PO TABS: ORAL_TABLET | ORAL | Status: DC

## 2012-11-02 MED ORDER — HYDROCODONE-ACETAMINOPHEN 5-325 MG PO TABS
1.0000 | ORAL_TABLET | Freq: Once | ORAL | Status: AC
  Administered 2012-11-02: 1 via ORAL
  Filled 2012-11-02: qty 1

## 2012-11-02 NOTE — ED Provider Notes (Signed)
History     CSN: [id number]  Arrival date & time 11/02/12  1554   First MD Initiated Contact with Patient 11/02/12 1739      Chief Complaint  Patient presents with  . Arm Pain    (Consider location/radiation/quality/duration/timing/severity/associated sxs/prior treatment) HPI Comments: Pt had a fall (tripped over his dog) last night. C/O left forearm area pain.  Patient is a 51 y.o. male presenting with arm pain. The history is provided by the patient.  Arm Pain This is a new problem. The current episode started yesterday. The problem has been unchanged. Associated symptoms include arthralgias. Pertinent negatives include no abdominal pain, chest pain, coughing or neck pain. Exacerbated by: movement and twisting positions. He has tried nothing for the symptoms. The treatment provided no relief.    Past Medical History  Diagnosis Date  . Bronchitis   . Acid reflux   . Pancreatitis   . Gout   . Bipolar disorder   . Hyperlipidemia   . Alcohol abuse   . Pseudocyst of pancreas 02/28/2012  . Arthritis   . Fracture of lower leg     Past Surgical History  Procedure Date  . Nasal suregery     broken nose  . Circumcision 03/17/2012    Procedure: CIRCUMCISION ADULT;  Surgeon: Ky Barban, MD;  Location: [facility];  Service: Urology;  Laterality: N/A;  . Right leg     Family History  Problem Relation Age of Onset  . Diabetes Father   . Anesthesia problems Neg Hx   . Hypotension Neg Hx   . Malignant hyperthermia Neg Hx   . Pseudochol deficiency Neg Hx     History  Substance Use Topics  . Smoking status: Current Every Day Smoker -- 0.5 packs/day for 25 years    Types: Cigarettes  . Smokeless tobacco: Not on file  . Alcohol Use: 0.0 oz/week     Comment: Daily      Review of Systems  Constitutional: Negative for activity change.       All ROS Neg except as noted in HPI  HENT: Negative for nosebleeds and neck pain.   Eyes: Negative for photophobia and discharge.    Respiratory: Negative for cough, shortness of breath and wheezing.   Cardiovascular: Negative for chest pain and palpitations.  Gastrointestinal: Negative for abdominal pain and blood in stool.  Genitourinary: Negative for dysuria, frequency and hematuria.  Musculoskeletal: Positive for arthralgias. Negative for back pain.  Skin: Negative.   Neurological: Negative for dizziness, seizures and speech difficulty.  Psychiatric/Behavioral: Negative for hallucinations and confusion.    Allergies  Penicillins  Home Medications   Current Outpatient Rx  Name  Route  Sig  Dispense  Refill  . ALBUTEROL SULFATE HFA 108 (90 BASE) MCG/ACT IN AERS   Inhalation   Inhale 2 puffs into the lungs every 4 (four) hours as needed for wheezing.   1 Inhaler   1   . AMLODIPINE BESYLATE 5 MG PO TABS   Oral   Take 1 tablet (5 mg total) by mouth daily.   30 tablet   4   . CITALOPRAM HYDROBROMIDE 20 MG PO TABS   Oral   Take 1 tablet (20 mg total) by mouth daily.   30 tablet   2   . CYCLOBENZAPRINE HCL 10 MG PO TABS   Oral   Take 1 tablet (10 mg total) by mouth 3 (three) times daily as needed. For muscle spasms   30 tablet  3   . FEXOFENADINE HCL 180 MG PO TABS   Oral   Take 1 tablet (180 mg total) by mouth daily.   30 tablet   6   . OMEGA-3 FATTY ACIDS 1000 MG PO CAPS   Oral   Take 1 g by mouth 3 (three) times daily.          Marland Kitchen GEMFIBROZIL 600 MG PO TABS   Oral   Take 1 tablet (600 mg total) by mouth 2 (two) times daily before a meal. FOR TREATMENT OF YOUR HIGH TRIGLYCERIDE LEVEL.   60 tablet   3   . IBUPROFEN 600 MG PO TABS   Oral   Take 1 tablet (600 mg total) by mouth every 6 (six) hours as needed for pain.   30 tablet   0   . PANTOPRAZOLE SODIUM 40 MG PO TBEC   Oral   Take 1 tablet (40 mg total) by mouth daily.   30 tablet   3   . PROMETHAZINE HCL 12.5 MG PO TABS   Oral   Take 1 tablet (12.5 mg total) by mouth every 6 (six) hours as needed for nausea.   15 tablet    0   . TADALAFIL 5 MG PO TABS   Oral   Take 1 tablet (5 mg total) by mouth daily as needed for erectile dysfunction. And BPH   30 tablet   0     Free coupon   . TAMSULOSIN HCL 0.4 MG PO CAPS   Oral   Take 1 capsule (0.4 mg total) by mouth daily.   30 capsule   6   . TRAZODONE HCL 100 MG PO TABS   Oral   Take 100 mg by mouth at bedtime as needed. Sleep           BP 115/87  Pulse 85  Temp 98.3 F (36.8 C) (Oral)  Resp 20  Ht 5\' 8"  (1.727 m)  Wt 105 lb (47.628 kg)  BMI 15.97 kg/m2  SpO2 99%  Physical Exam  Nursing note and vitals reviewed. Constitutional: He is oriented to person, place, and time. He appears well-developed and well-nourished.  Non-toxic appearance.  HENT:  Head: Normocephalic.  Right Ear: Tympanic membrane and external ear normal.  Left Ear: Tympanic membrane and external ear normal.  Eyes: EOM and lids are normal. Pupils are equal, round, and reactive to light.  Neck: Normal range of motion. Neck supple. Carotid bruit is not present.  Cardiovascular: Normal rate, regular rhythm, normal heart sounds, intact distal pulses and normal pulses.   Pulmonary/Chest: Breath sounds normal. No respiratory distress.  Abdominal: Soft. Bowel sounds are normal. There is no tenderness. There is no guarding.  Musculoskeletal: Normal range of motion.       There is soreness of the left hand. No deformity noted. There is no pain in the anatomical snuff box. There is full range of motion of the wrist but with mild soreness. There is soreness of the forearm area. No deformity noted. There is full range of motion of the elbow and left shoulder. The radial pulses 2+ and the capillary refill is less than 3 seconds.  Lymphadenopathy:       Head (right side): No submandibular adenopathy present.       Head (left side): No submandibular adenopathy present.    He has no cervical adenopathy.  Neurological: He is alert and oriented to person, place, and time. He has normal  strength. No cranial nerve deficit  or sensory deficit.  Skin: Skin is warm and dry.  Psychiatric: He has a normal mood and affect. His speech is normal.    ED Course  Procedures (including critical care time)  Labs Reviewed - No data to display Dg Forearm Left  11/02/2012  *RADIOLOGY REPORT*  Clinical Data: Trauma and pain.  LEFT FOREARM - 2 VIEW  Comparison: None.  Findings: A nutrient foramen within the proximal radius. No acute fracture or dislocation.  No elbow joint effusion.  IMPRESSION: No acute osseous abnormality.   Original Report Authenticated By: Jeronimo Greaves, M.D.      1. Arm pain, left       MDM  I have reviewed nursing notes, vital signs, and all appropriate lab and imaging results for this patient. The x-ray of the left forearm is negative for fracture or dislocation. The patient is fitted with a wrist forearm splint to be used over the next 5-7 days. Patient is treated with Mobic 7.5 mg 2 times daily for soreness.       Kathie Dike, [place] 11/02/12 [phone]

## 2012-11-02 NOTE — ED Notes (Signed)
Pt tripped over his dog last night and fell, tried to catch self with left arm, now has pain to left hand and left forearm, no swelling noted

## 2012-11-03 NOTE — ED Provider Notes (Signed)
Medical screening examination/treatment/procedure(s) were performed by non-physician practitioner and as supervising physician I was immediately available for consultation/collaboration.   Glynn Octave, MD 11/03/12 [phone]

## 2012-11-11 ENCOUNTER — Telehealth: Payer: Self-pay | Admitting: Family Medicine

## 2012-11-11 NOTE — Telephone Encounter (Signed)
Patient also aware that he will not receive response until 11/21

## 2012-11-11 NOTE — Telephone Encounter (Signed)
Patient aware that he last received rx on 10/28 and states that he filled it on 10/29 but states that he is out of medicine and is asking for refill.

## 2012-11-11 NOTE — Telephone Encounter (Signed)
Patient received #60 of Percocet on 10/28.  Is it ok to fill this early?  Will notify patient that response will not be received until 11/21

## 2012-11-11 NOTE — Telephone Encounter (Signed)
This can not be refilled until 11/27 , since we will be closed for holiday he can pick script on next Tuesday before holiday closure. Remind him his meds must last 30 days. He is on pain contract

## 2012-11-12 NOTE — Telephone Encounter (Signed)
Called and left message for patient to return call.  

## 2012-11-16 ENCOUNTER — Other Ambulatory Visit: Payer: Self-pay

## 2012-11-16 MED ORDER — OXYCODONE-ACETAMINOPHEN 7.5-325 MG PO TABS: 1.0000 | ORAL_TABLET | Freq: Four times a day (QID) | ORAL | Status: DC | PRN

## 2012-11-16 NOTE — Telephone Encounter (Signed)
Called and left message with girlfriend that patient can collect script on 11/26

## 2012-11-17 ENCOUNTER — Encounter (HOSPITAL_COMMUNITY): Payer: Self-pay | Admitting: Emergency Medicine

## 2012-11-17 ENCOUNTER — Emergency Department (HOSPITAL_COMMUNITY)
Admission: EM | Admit: 2012-11-17 | Discharge: 2012-11-17 | Disposition: A | Discharge status: Home / Self Care | Payer: Medicare Other | Attending: Emergency Medicine | Admitting: Emergency Medicine

## 2012-11-17 ENCOUNTER — Emergency Department (HOSPITAL_COMMUNITY): Payer: Medicare Other

## 2012-11-17 DIAGNOSIS — Z8719 Personal history of other diseases of the digestive system: Secondary | ICD-10-CM | POA: Insufficient documentation

## 2012-11-17 DIAGNOSIS — Z79899 Other long term (current) drug therapy: Secondary | ICD-10-CM | POA: Insufficient documentation

## 2012-11-17 DIAGNOSIS — F17200 Nicotine dependence, unspecified, uncomplicated: Secondary | ICD-10-CM | POA: Insufficient documentation

## 2012-11-17 DIAGNOSIS — R110 Nausea: Secondary | ICD-10-CM

## 2012-11-17 DIAGNOSIS — R7401 Elevation of levels of liver transaminase levels: Secondary | ICD-10-CM | POA: Insufficient documentation

## 2012-11-17 DIAGNOSIS — R61 Generalized hyperhidrosis: Secondary | ICD-10-CM

## 2012-11-17 DIAGNOSIS — Z862 Personal history of diseases of the blood and blood-forming organs and certain disorders involving the immune mechanism: Secondary | ICD-10-CM | POA: Insufficient documentation

## 2012-11-17 DIAGNOSIS — R05 Cough: Secondary | ICD-10-CM | POA: Insufficient documentation

## 2012-11-17 DIAGNOSIS — R7402 Elevation of levels of lactic acid dehydrogenase (LDH): Secondary | ICD-10-CM | POA: Insufficient documentation

## 2012-11-17 DIAGNOSIS — F319 Bipolar disorder, unspecified: Secondary | ICD-10-CM | POA: Insufficient documentation

## 2012-11-17 DIAGNOSIS — E785 Hyperlipidemia, unspecified: Secondary | ICD-10-CM | POA: Insufficient documentation

## 2012-11-17 DIAGNOSIS — Z8709 Personal history of other diseases of the respiratory system: Secondary | ICD-10-CM | POA: Insufficient documentation

## 2012-11-17 DIAGNOSIS — L74519 Primary focal hyperhidrosis, unspecified: Secondary | ICD-10-CM | POA: Insufficient documentation

## 2012-11-17 DIAGNOSIS — Z8639 Personal history of other endocrine, nutritional and metabolic disease: Secondary | ICD-10-CM | POA: Insufficient documentation

## 2012-11-17 DIAGNOSIS — R109 Unspecified abdominal pain: Secondary | ICD-10-CM | POA: Insufficient documentation

## 2012-11-17 DIAGNOSIS — Z8739 Personal history of other diseases of the musculoskeletal system and connective tissue: Secondary | ICD-10-CM | POA: Insufficient documentation

## 2012-11-17 DIAGNOSIS — R112 Nausea with vomiting, unspecified: Secondary | ICD-10-CM | POA: Insufficient documentation

## 2012-11-17 DIAGNOSIS — R059 Cough, unspecified: Secondary | ICD-10-CM | POA: Insufficient documentation

## 2012-11-17 DIAGNOSIS — F1010 Alcohol abuse, uncomplicated: Secondary | ICD-10-CM | POA: Insufficient documentation

## 2012-11-17 LAB — URINALYSIS, ROUTINE W REFLEX MICROSCOPIC
Glucose, UA: NEGATIVE mg/dL
Ketones, ur: >80 mg/dL — AB
Leukocytes, UA: NEGATIVE
Nitrite: NEGATIVE
Protein, ur: 30 mg/dL — AB
Specific Gravity, Urine: >1.03 — ABNORMAL HIGH (ref 1.005–1.030)
Urobilinogen, UA: 0.2 mg/dL (ref 0.0–1.0)
pH: 6 (ref 5.0–8.0)

## 2012-11-17 LAB — COMPREHENSIVE METABOLIC PANEL
ALT: 20 U/L (ref 0–53)
AST: 66 U/L — ABNORMAL HIGH (ref 0–37)
Albumin: 4.5 g/dL (ref 3.5–5.2)
Alkaline Phosphatase: 60 U/L (ref 39–117)
BUN: 14 mg/dL (ref 6–23)
CO2: 26 mEq/L (ref 19–32)
Calcium: 9.7 mg/dL (ref 8.4–10.5)
Chloride: 92 mEq/L — ABNORMAL LOW (ref 96–112)
Creatinine, Ser: 0.8 mg/dL (ref 0.50–1.35)
GFR calc Af Amer: >90 mL/min (ref >90)
GFR calc non Af Amer: >90 mL/min (ref >90)
Glucose, Bld: 73 mg/dL (ref 70–99)
Potassium: 4.2 mEq/L (ref 3.5–5.1)
Sodium: 135 mEq/L (ref 135–145)
Total Bilirubin: 0.7 mg/dL (ref 0.3–1.2)
Total Protein: 8.7 g/dL — ABNORMAL HIGH (ref 6.0–8.3)

## 2012-11-17 LAB — SEDIMENTATION RATE: Sed Rate: 14 mm/hr (ref 0–16)

## 2012-11-17 LAB — CBC WITH DIFFERENTIAL/PLATELET
Basophils Absolute: 0 10*3/uL (ref 0.0–0.1)
Basophils Relative: 1 % (ref 0–1)
Eosinophils Absolute: 0 10*3/uL (ref 0.0–0.7)
Eosinophils Relative: 1 % (ref 0–5)
HCT: 47.8 % (ref 39.0–52.0)
Hemoglobin: 16.3 g/dL (ref 13.0–17.0)
Lymphocytes Relative: 34 % (ref 12–46)
Lymphs Abs: 1.3 10*3/uL (ref 0.7–4.0)
MCH: 33.5 pg (ref 26.0–34.0)
MCHC: 34.1 g/dL (ref 30.0–36.0)
MCV: 98.4 fL (ref 78.0–100.0)
Monocytes Absolute: 0.6 10*3/uL (ref 0.1–1.0)
Monocytes Relative: 16 % — ABNORMAL HIGH (ref 3–12)
Neutro Abs: 1.8 10*3/uL (ref 1.7–7.7)
Neutrophils Relative %: 48 % (ref 43–77)
Platelets: 274 10*3/uL (ref 150–400)
RBC: 4.86 MIL/uL (ref 4.22–5.81)
RDW: 13.8 % (ref 11.5–15.5)
WBC: 3.8 10*3/uL — ABNORMAL LOW (ref 4.0–10.5)

## 2012-11-17 LAB — URINE MICROSCOPIC-ADD ON

## 2012-11-17 LAB — LIPASE, BLOOD: Lipase: 38 U/L (ref 11–59)

## 2012-11-17 MED ORDER — OXYCODONE-ACETAMINOPHEN 5-325 MG PO TABS: 1.0000 | ORAL_TABLET | ORAL | Status: AC | PRN

## 2012-11-17 MED ORDER — ONDANSETRON HCL 4 MG PO TABS: 4.0000 mg | ORAL_TABLET | Freq: Four times a day (QID) | ORAL | Status: DC | PRN

## 2012-11-17 MED ORDER — IOHEXOL 300 MG/ML  SOLN
100.0000 mL | Freq: Once | INTRAMUSCULAR | Status: AC | PRN
  Administered 2012-11-17: 100 mL via INTRAVENOUS

## 2012-11-17 MED ORDER — MORPHINE SULFATE 4 MG/ML IJ SOLN
4.0000 mg | Freq: Once | INTRAMUSCULAR | Status: AC
  Administered 2012-11-17: 4 mg via INTRAVENOUS
  Filled 2012-11-17: qty 1

## 2012-11-17 MED ORDER — ONDANSETRON HCL 4 MG/2ML IJ SOLN
4.0000 mg | Freq: Once | INTRAMUSCULAR | Status: AC
  Administered 2012-11-17: 4 mg via INTRAVENOUS
  Filled 2012-11-17: qty 2

## 2012-11-17 MED ORDER — SODIUM CHLORIDE 0.9 % IV SOLN
Freq: Once | INTRAVENOUS | Status: AC
  Administered 2012-11-17: 12:00:00 via INTRAVENOUS

## 2012-11-17 NOTE — ED Notes (Signed)
Pt c/o excessive sweating, abd pain with n/v x one week.

## 2012-11-17 NOTE — ED Notes (Signed)
Pt unable to void at this time. 

## 2012-11-17 NOTE — ED Provider Notes (Signed)
History  This chart was scribed for Dione Booze, MD by Manuela Schwartz, ED scribe. This patient was seen in room [facility] and the patient's care was started at 1112.   CSN: [id number]  Arrival date & time 11/17/12  1112   First MD Initiated Contact with Patient 11/17/12 1117      Chief Complaint  Patient presents with  . Abdominal Pain  . Nausea  . Vomiting  . Excessive Sweating   The history is provided by the patient. No language interpreter was used.   [name] [name] is a 51 y.o. male who presents to the Emergency Department complaining of constant diaphoresis for the past week with associated constant gradually worsening 7/10 left sided abdominal pain for the past week. He states taking hydrocodone and nausea medicine at home without relief from his pain for nausea. He states associated emesis, productive yellow cough, and decreased appetite. He denies fever/chills, sore throat.  Past Medical History  Diagnosis Date  . Bronchitis   . Acid reflux   . Pancreatitis   . Gout   . Bipolar disorder   . Hyperlipidemia   . Alcohol abuse   . Pseudocyst of pancreas 02/28/2012  . Arthritis   . Fracture of lower leg     Past Surgical History  Procedure Date  . Nasal suregery     broken nose  . Circumcision 03/17/2012    Procedure: CIRCUMCISION ADULT;  Surgeon: Ky Barban, MD;  Location: [facility];  Service: Urology;  Laterality: N/A;  . Right leg     Family History  Problem Relation Age of Onset  . Diabetes Father   . Anesthesia problems Neg Hx   . Hypotension Neg Hx   . Malignant hyperthermia Neg Hx   . Pseudochol deficiency Neg Hx     History  Substance Use Topics  . Smoking status: Current Every Day Smoker -- 0.5 packs/day for 25 years    Types: Cigarettes  . Smokeless tobacco: Not on file  . Alcohol Use: 0.0 oz/week     Comment: Daily      Review of Systems  Constitutional: Negative for fever and chills.  Respiratory: Positive for cough. Negative for shortness  of breath.   Gastrointestinal: Positive for nausea, vomiting and abdominal pain (left sided).  Neurological: Negative for weakness.  All other systems reviewed and are negative.    Allergies  Penicillins  Home Medications   Current Outpatient Rx  Name  Route  Sig  Dispense  Refill  . ALBUTEROL SULFATE HFA 108 (90 BASE) MCG/ACT IN AERS   Inhalation   Inhale 2 puffs into the lungs every 4 (four) hours as needed for wheezing.   1 Inhaler   1   . AMLODIPINE BESYLATE 5 MG PO TABS   Oral   Take 1 tablet (5 mg total) by mouth daily.   30 tablet   4   . CITALOPRAM HYDROBROMIDE 20 MG PO TABS   Oral   Take 1 tablet (20 mg total) by mouth daily.   30 tablet   2   . CYCLOBENZAPRINE HCL 10 MG PO TABS   Oral   Take 1 tablet (10 mg total) by mouth 3 (three) times daily as needed. For muscle spasms   30 tablet   3   . FEXOFENADINE HCL 180 MG PO TABS   Oral   Take 1 tablet (180 mg total) by mouth daily.   30 tablet   6   . OMEGA-3 FATTY ACIDS 1000  MG PO CAPS   Oral   Take 1 g by mouth 3 (three) times daily.          Marland Kitchen GEMFIBROZIL 600 MG PO TABS   Oral   Take 1 tablet (600 mg total) by mouth 2 (two) times daily before a meal. FOR TREATMENT OF YOUR HIGH TRIGLYCERIDE LEVEL.   60 tablet   3   . IBUPROFEN 600 MG PO TABS   Oral   Take 1 tablet (600 mg total) by mouth every 6 (six) hours as needed for pain.   30 tablet   0   . MELOXICAM 7.5 MG PO TABS      1 po bid with food   12 tablet   0   . OXYCODONE-ACETAMINOPHEN 7.5-325 MG PO TABS   Oral   Take 1 tablet by mouth every 6 (six) hours as needed for pain.   60 tablet   0   . PANTOPRAZOLE SODIUM 40 MG PO TBEC   Oral   Take 1 tablet (40 mg total) by mouth daily.   30 tablet   3   . PROMETHAZINE HCL 12.5 MG PO TABS   Oral   Take 1 tablet (12.5 mg total) by mouth every 6 (six) hours as needed for nausea.   15 tablet   0   . TADALAFIL 5 MG PO TABS   Oral   Take 1 tablet (5 mg total) by mouth daily as  needed for erectile dysfunction. And BPH   30 tablet   0     Free coupon   . TAMSULOSIN HCL 0.4 MG PO CAPS   Oral   Take 1 capsule (0.4 mg total) by mouth daily.   30 capsule   6   . TRAZODONE HCL 100 MG PO TABS   Oral   Take 100 mg by mouth at bedtime as needed. Sleep           Triage Vitals: BP 130/95  Pulse 86  Temp 98.5 F (36.9 C) (Oral)  Resp 18  Ht 5\' 10"  (1.778 m)  Wt 183 lb (83.008 kg)  BMI 26.26 kg/m2  SpO2 99%  Physical Exam  Nursing note and vitals reviewed. Constitutional: He is oriented to person, place, and time. He appears well-developed and well-nourished. No distress.  HENT:  Head: Normocephalic and atraumatic.  Eyes: EOM are normal.  Neck: Neck supple. No tracheal deviation present.  Cardiovascular: Normal rate.   Pulmonary/Chest: Effort normal. No respiratory distress.  Abdominal: Soft. Bowel sounds are normal. He exhibits no distension. There is tenderness (moderate tenderness epigastrium LUQ and LLQ). There is no rebound and no guarding.  Musculoskeletal: Normal range of motion.  Neurological: He is alert and oriented to person, place, and time.  Skin: Skin is warm and dry.  Psychiatric: He has a normal mood and affect. His behavior is normal.    ED Course  Procedures (including critical care time) DIAGNOSTIC STUDIES: Oxygen Saturation is 99% on room air, normal by my interpretation.    COORDINATION OF CARE: At 1135 AM Discussed treatment plan with patient which includes IV fluids, pain/nausea medicine, blood work, abdominal CT, CXR. Patient agrees.   Results for orders placed during the hospital encounter of 11/17/12  CBC WITH DIFFERENTIAL      Component Value Range   WBC 3.8 (*) 4.0 - 10.5 K/uL   RBC 4.86  4.22 - 5.81 MIL/uL   Hemoglobin 16.3  13.0 - 17.0 g/dL   HCT 54.0  98.1 -  52.0 %   MCV 98.4  78.0 - 100.0 fL   MCH 33.5  26.0 - 34.0 pg   MCHC 34.1  30.0 - 36.0 g/dL   RDW 09.8  11.9 - 14.7 %   Platelets 274  150 - 400 K/uL    Neutrophils Relative 48  43 - 77 %   Neutro Abs 1.8  1.7 - 7.7 K/uL   Lymphocytes Relative 34  12 - 46 %   Lymphs Abs 1.3  0.7 - 4.0 K/uL   Monocytes Relative 16 (*) 3 - 12 %   Monocytes Absolute 0.6  0.1 - 1.0 K/uL   Eosinophils Relative 1  0 - 5 %   Eosinophils Absolute 0.0  0.0 - 0.7 K/uL   Basophils Relative 1  0 - 1 %   Basophils Absolute 0.0  0.0 - 0.1 K/uL  SEDIMENTATION RATE      Component Value Range   Sed Rate 14  0 - 16 mm/hr  COMPREHENSIVE METABOLIC PANEL      Component Value Range   Sodium 135  135 - 145 mEq/L   Potassium 4.2  3.5 - 5.1 mEq/L   Chloride 92 (*) 96 - 112 mEq/L   CO2 26  19 - 32 mEq/L   Glucose, Bld 73  70 - 99 mg/dL   BUN 14  6 - 23 mg/dL   Creatinine, Ser 8.29  0.50 - 1.35 mg/dL   Calcium 9.7  8.4 - 56.2 mg/dL   Total Protein 8.7 (*) 6.0 - 8.3 g/dL   Albumin 4.5  3.5 - 5.2 g/dL   AST 66 (*) 0 - 37 U/L   ALT 20  0 - 53 U/L   Alkaline Phosphatase 60  39 - 117 U/L   Total Bilirubin 0.7  0.3 - 1.2 mg/dL   GFR calc non Af Amer >90  >90 mL/min   GFR calc Af Amer >90  >90 mL/min  URINALYSIS, ROUTINE W REFLEX MICROSCOPIC      Component Value Range   Color, Urine YELLOW  YELLOW   APPearance CLEAR  CLEAR   Specific Gravity, Urine >1.030 (*) 1.005 - 1.030   pH 6.0  5.0 - 8.0   Glucose, UA NEGATIVE  NEGATIVE mg/dL   Hgb urine dipstick TRACE (*) NEGATIVE   Bilirubin Urine MODERATE (*) NEGATIVE   Ketones, ur >80 (*) NEGATIVE mg/dL   Protein, ur 30 (*) NEGATIVE mg/dL   Urobilinogen, UA 0.2  0.0 - 1.0 mg/dL   Nitrite NEGATIVE  NEGATIVE   Leukocytes, UA NEGATIVE  NEGATIVE  LIPASE, BLOOD      Component Value Range   Lipase 38  11 - 59 U/L  URINE MICROSCOPIC-ADD ON      Component Value Range   Squamous Epithelial / LPF FEW (*) RARE   RBC / HPF 0-2  <3 RBC/hpf   Dg Chest 2 View  11/17/2012  *RADIOLOGY REPORT*  Clinical Data: Nausea, vomiting, hypertension, smoker  CHEST - 2 VIEW  Comparison: 06/12/2009  Findings: Heart size and vascular pattern are  normal.  Lungs are clear.  No change from prior study. There is a stable nonacute inferior lateral left rib fracture.  IMPRESSION: No acute findings.   Original Report Authenticated By: Esperanza Heir, M.D.    Dg Forearm Left  11/02/2012  *RADIOLOGY REPORT*  Clinical Data: Trauma and pain.  LEFT FOREARM - 2 VIEW  Comparison: None.  Findings: A nutrient foramen within the proximal radius. No acute fracture or dislocation.  No elbow joint effusion.  IMPRESSION: No acute osseous abnormality.   Original Report Authenticated By: Jeronimo Greaves, M.D.    Ct Abdomen Pelvis W Contrast  11/17/2012  *RADIOLOGY REPORT*  Clinical Data: Vomiting and mid abdominal pain.  CT ABDOMEN AND PELVIS WITH CONTRAST  Technique:  Multidetector CT imaging of the abdomen and pelvis was performed following the standard protocol during bolus administration of intravenous contrast.  Contrast: OMNIPAQUE IOHEXOL 300 MG/ML  SOLN  Comparison: CT abdomen and pelvis 10/02/2012.  Findings: Mild dependent atelectasis is present in the lung bases. There is no pleural or pericardial effusion.  The liver is diffusely low attenuating consistent with fatty infiltration.  There is no focal liver lesion.  There is some mild atrophy of the proximal body of the pancreas and slight prominence of the pancreatic duct which appear unchanged.  No evidence of pancreatic inflammatory process is seen.  There is no fluid collection.  The gallbladder, adrenal glands, spleen and kidneys appear normal.  Chronic splenic vein thrombosis is described on report of comparison study is not as well demonstrated on today's exam.  The stomach, small and large bowel and appendix appear normal.  There is no lymphadenopathy or fluid.  No focal bony abnormality is identified.  IMPRESSION:  1.  No acute finding. 2.  Fatty infiltration of the liver.   Original Report Authenticated By: Holley Dexter, M.D.       1. Abdominal pain   2. Nausea   3. Hyperhidrosis   4.  Elevated transaminase level       MDM  Abdominal pain and nausea of uncertain cause. Hyperhidrosis of uncertain cause. There is mild abdominal tenderness on exam, so CT scan will be obtained to evaluate for possible diverticulitis.  CT scan is unremarkable as is laboratory workup. There is mild elevation of AST which is chronic. It is noted that his total protein has increased and this may need further investigation as an outpatient. He is referred back to his PCP and to gastroenterology. He is discharged with prescriptions for Percocet and ondansetron.   I personally performed the services described in this documentation, which was scribed in my presence. The recorded information has been reviewed and is accurate.           Dione Booze, MD 11/17/12 [phone]

## 2012-11-17 NOTE — ED Notes (Signed)
Pt called to nurses station requesting medication for pain; states abd pain is now worse. EDP notified and orders rec'd.

## 2012-11-17 NOTE — ED Notes (Signed)
Assumed c/o pt from S. Woods, RN. 

## 2012-11-24 ENCOUNTER — Ambulatory Visit: Payer: Medicare Other | Admitting: Gastroenterology

## 2012-11-26 ENCOUNTER — Ambulatory Visit (INDEPENDENT_AMBULATORY_CARE_PROVIDER_SITE_OTHER): Payer: Medicare Other | Admitting: Gastroenterology

## 2012-11-26 ENCOUNTER — Encounter: Payer: Self-pay | Admitting: Gastroenterology

## 2012-11-26 ENCOUNTER — Telehealth: Payer: Self-pay | Admitting: Family Medicine

## 2012-11-26 VITALS — BP 142/86 | HR 96 | Temp 98.4°F | Ht 70.5 in | Wt 179.4 lb

## 2012-11-26 DIAGNOSIS — R1013 Epigastric pain: Secondary | ICD-10-CM

## 2012-11-26 DIAGNOSIS — Z1211 Encounter for screening for malignant neoplasm of colon: Secondary | ICD-10-CM

## 2012-11-26 DIAGNOSIS — K3189 Other diseases of stomach and duodenum: Secondary | ICD-10-CM

## 2012-11-26 MED ORDER — PANTOPRAZOLE SODIUM 40 MG PO TBEC: 40.0000 mg | DELAYED_RELEASE_TABLET | Freq: Two times a day (BID) | ORAL | Status: DC

## 2012-11-26 MED ORDER — PEG 3350-KCL-NA BICARB-NACL 420 G PO SOLR: 4000.0000 mL | ORAL | Status: DC

## 2012-11-26 MED ORDER — ONDANSETRON HCL 4 MG PO TABS: 4.0000 mg | ORAL_TABLET | Freq: Four times a day (QID) | ORAL | Status: DC | PRN

## 2012-11-26 NOTE — Progress Notes (Signed)
Primary Care Physician:  Milinda Antis, MD Primary Gastroenterologist:  Dr. Jena Gauss   Chief Complaint  Patient presents with  . Abdominal Pain    HPI:   51 year old male who presents today as an initial consult secondary to dyspepsia. He has a hx notable for pancreatitis, likely secondary to ETOH abuse and hypertriglyceridemia. Prior hospitalization earlier this year. He continues to drink about 6-8 beers per day. Last used marijuana and cocaine about a week ago. Presented to the ED last Tuesday after vomiting, profuse sweating, abdominal pain.   Mid abdomen "soreness" when throwing up. Notes nausea for last 2 weeks but getting better. Hx of chronic nausea first thing in morning. Worsened over past few weeks. Sometimes can't eat breakfast first thing because feels like will throw up. Lost a few lbs over the past few weeks. Usually stays around 183-185. Decreased appetite for the past few weeks. +reflux, just switched to Protonix from Prilosec, as felt like Prilosec wasn't working. Had increased Prilosec to BID and Tums. Protonix works well. No melena. No brbpr.   Meloxicam BID since last year. +BC powders intermittently for headaches but states rare. +constipation, new onset in past few weeks. Usually goes about 1-2 times per day, states "constipated". Further investigation reveals he has to strain, which makes him feel constipated.    No prior upper or lower GI evaluation.   CT in ED without acute finding, fatty liver, chronic splenic vein thrombosis.  Lipase normal, AST elevated 66, otherwise LFTs normal. Chronically elevated AST.   Lab Results  Component Value Date   ALT 20 11/17/2012   AST 66* 11/17/2012   ALKPHOS 60 11/17/2012   BILITOT 0.7 11/17/2012      Past Medical History  Diagnosis Date  . Bronchitis   . Acid reflux   . Pancreatitis     March 2013  . Gout   . Bipolar disorder   . Hyperlipidemia   . Alcohol abuse     6-8 cans of beer daily  . Pseudocyst of pancreas  02/28/2012  . Arthritis   . Fracture of lower leg     Past Surgical History  Procedure Date  . Nose surgery     broken nose  . Circumcision 03/17/2012    Procedure: CIRCUMCISION ADULT;  Surgeon: Ky Barban, MD;  Location: [facility];  Service: Urology;  Laterality: N/A;    Current Outpatient Prescriptions  Medication Sig Dispense Refill  . albuterol (PROVENTIL HFA;VENTOLIN HFA) 108 (90 BASE) MCG/ACT inhaler Inhale 2 puffs into the lungs every 4 (four) hours as needed for wheezing.  1 Inhaler  1  . amLODipine (NORVASC) 5 MG tablet Take 1 tablet (5 mg total) by mouth daily.  30 tablet  4  . citalopram (CELEXA) 20 MG tablet Take 1 tablet (20 mg total) by mouth daily.  30 tablet  2  . cyclobenzaprine (FLEXERIL) 10 MG tablet Take 1 tablet (10 mg total) by mouth 3 (three) times daily as needed. For muscle spasms  30 tablet  3  . fexofenadine (ALLEGRA) 180 MG tablet Take 1 tablet (180 mg total) by mouth daily.  30 tablet  6  . fish oil-omega-3 fatty acids 1000 MG capsule Take 1 g by mouth daily.       Marland Kitchen gemfibrozil (LOPID) 600 MG tablet Take 1 tablet (600 mg total) by mouth 2 (two) times daily before a meal. FOR TREATMENT OF YOUR HIGH TRIGLYCERIDE LEVEL.  60 tablet  3  . ibuprofen (ADVIL,MOTRIN) 600 MG tablet Take  1 tablet (600 mg total) by mouth every 6 (six) hours as needed for pain.  30 tablet  0  . meloxicam (MOBIC) 7.5 MG tablet Take 7.5 mg by mouth 2 (two) times daily. 1 po bid with food      . ondansetron (ZOFRAN) 4 MG tablet Take 1 tablet (4 mg total) by mouth every 6 (six) hours as needed for nausea.  20 tablet  0  . oxyCODONE-acetaminophen (PERCOCET) 7.5-325 MG per tablet Take 1 tablet by mouth every 6 (six) hours as needed for pain.  60 tablet  0  . oxyCODONE-acetaminophen (PERCOCET/ROXICET) 5-325 MG per tablet Take 1 tablet by mouth every 4 (four) hours as needed for pain.  20 tablet  0  . pantoprazole (PROTONIX) 40 MG tablet Take 1 tablet (40 mg total) by mouth 2 (two) times daily.   60 tablet  3  . promethazine (PHENERGAN) 12.5 MG tablet Take 1 tablet (12.5 mg total) by mouth every 6 (six) hours as needed for nausea.  15 tablet  0  . tadalafil (CIALIS) 5 MG tablet Take 1 tablet (5 mg total) by mouth daily as needed for erectile dysfunction. And BPH  30 tablet  0  . Tamsulosin HCl (FLOMAX) 0.4 MG CAPS Take 1 capsule (0.4 mg total) by mouth daily.  30 capsule  6  . traZODone (DESYREL) 100 MG tablet Take 100 mg by mouth at bedtime as needed. Sleep      . [DISCONTINUED] pantoprazole (PROTONIX) 40 MG tablet Take 1 tablet (40 mg total) by mouth daily.  30 tablet  3  . polyethylene glycol-electrolytes (TRILYTE) 420 G solution Take 4,000 mLs by mouth as directed.  4000 mL  0    Allergies as of 11/26/2012 - Review Complete 11/26/2012  Allergen Reaction Noted  . Penicillins Itching 08/05/2011    Family History  Problem Relation Age of Onset  . Diabetes Father   . Anesthesia problems Neg Hx   . Hypotension Neg Hx   . Malignant hyperthermia Neg Hx   . Pseudochol deficiency Neg Hx   . Colon cancer Neg Hx     History   Social History  . Marital Status: Single    Spouse Name: N/A    Number of Children: N/A  . Years of Education: N/A   Occupational History  . unemployed    Social History Main Topics  . Smoking status: Current Every Day Smoker -- 0.5 packs/day for 25 years    Types: Cigarettes  . Smokeless tobacco: Not on file  . Alcohol Use: 0.0 oz/week     Comment: 6-8 beers a day  . Drug Use: No     Comment: hx of marijuana and cocaine use, last Friday  . Sexually Active: Not on file   Other Topics Concern  . Not on file   Social History Narrative  . No narrative on file    Review of Systems: Gen: Denies any fever, chills, fatigue, weight loss, lack of appetite.  CV: Denies chest pain, heart palpitations, peripheral edema, syncope.  Resp: Denies shortness of breath at rest or with exertion. Denies wheezing or cough.  GI: Denies dysphagia or odynophagia.  Denies jaundice, hematemesis, fecal incontinence. GU : Denies urinary burning, urinary frequency, urinary hesitancy MS: Denies joint pain, muscle weakness, cramps, or limitation of movement.  Derm: Denies rash, itching, dry skin Psych: Denies depression, anxiety, memory loss, and confusion Heme: Denies bruising, bleeding, and enlarged lymph nodes.  Physical Exam: BP 142/86  Pulse 96  Temp 98.4 F (36.9 C) (Oral)  Ht 5' 10.5" (1.791 m)  Wt 179 lb 6.4 oz (81.375 kg)  BMI 25.38 kg/m2 General:   Alert and oriented. Pleasant and cooperative. Well-nourished and well-developed.  Head:  Normocephalic and atraumatic. Eyes:  Without icterus, sclera clear and conjunctiva pink.  Ears:  Normal auditory acuity. Nose:  No deformity, discharge,  or lesions. Mouth:  No deformity or lesions, oral mucosa pink.  Neck:  Supple, without mass or thyromegaly. Lungs:  Clear to auscultation bilaterally. No wheezes, rales, or rhonchi. No distress.  Heart:  S1, S2 present without murmurs appreciated.  Abdomen:  +BS, soft, non-tender and non-distended. No HSM noted. No guarding or rebound. No masses appreciated.  Rectal:  Deferred  Msk:  Symmetrical without gross deformities. Normal posture. Extremities:  Without clubbing or edema. Neurologic:  Alert and  oriented x4;  grossly normal neurologically. Skin:  Intact without significant lesions or rashes. Cervical Nodes:  No significant cervical adenopathy. Psych:  Alert and cooperative. Normal mood and affect.

## 2012-11-26 NOTE — Telephone Encounter (Signed)
Sent in

## 2012-11-26 NOTE — Patient Instructions (Addendum)
Limit alcohol use. Limit Ibuprofen and Mobic.   We will be doing a drug screen in a few weeks; the substances can show up in the drug screen even after 30 days, so do not use anymore.   I have increased Protonix to twice a day. Take this 30 minutes before breakfast and dinner, on an empty stomach.  We have set you up for a screening colonoscopy and an upper endoscopy. Please call us if you have any concerns in the meantime.

## 2012-12-01 DIAGNOSIS — Z1211 Encounter for screening for malignant neoplasm of colon: Secondary | ICD-10-CM | POA: Insufficient documentation

## 2012-12-01 DIAGNOSIS — R1013 Epigastric pain: Secondary | ICD-10-CM | POA: Insufficient documentation

## 2012-12-01 NOTE — Assessment & Plan Note (Signed)
Due for routine screening. Notes change in bowel habits to include difficulty in productive bowel movements, +straining. No rectal bleeding. TCS at time of EGD, WITH PROPOFOL

## 2012-12-01 NOTE — Assessment & Plan Note (Addendum)
51 year old male with hx of pancreatitis secondary to ETOH and hypertriglyceridemia, presenting with upper abdominal pain, early satiety, chronic nausea in the setting of ETOH abuse and daily NSAIDs. Recent lipase normal on file, without CT evidence for acute pancreatitis. Notable chronic splenic vein thrombosis. No melena noted. Chronically elevated AST, likely secondary to ETOH, fatty liver. No prior EGD. Query gastritis, PUD, may have element of chronic pancreatitis. Recommended ETOH cessation, cessation of marijuana and cocaine.   Increase Protonix to BID Proceed with upper endoscopy in the near future with Dr. Jena Gauss. The risks, benefits, and alternatives have been discussed in detail with patient. They have stated understanding and desire to proceed.  PROPOFOL due to hx of marijuana, COCAINE use. Needs negative drug screen prior.  ?further management of splenic vein thrombosis, will d/w Dr. Jena Gauss. Noted as chronic on CT. Question r/t possible chronic pancreatitis.  Addendum 12/24/12: Discussed with hem/onc; no intervention of splenic vein thrombosis due to chronicity

## 2012-12-02 ENCOUNTER — Encounter (HOSPITAL_COMMUNITY): Payer: Self-pay | Admitting: Pharmacy Technician

## 2012-12-02 NOTE — Progress Notes (Signed)
Faxed to PCP

## 2012-12-14 ENCOUNTER — Emergency Department (HOSPITAL_COMMUNITY)
Admission: EM | Admit: 2012-12-14 | Discharge: 2012-12-14 | Disposition: A | Discharge status: Home / Self Care | Payer: Medicare Other | Attending: Emergency Medicine | Admitting: Emergency Medicine

## 2012-12-14 ENCOUNTER — Encounter (HOSPITAL_COMMUNITY): Payer: Self-pay | Admitting: *Deleted

## 2012-12-14 DIAGNOSIS — M129 Arthropathy, unspecified: Secondary | ICD-10-CM | POA: Insufficient documentation

## 2012-12-14 DIAGNOSIS — E785 Hyperlipidemia, unspecified: Secondary | ICD-10-CM | POA: Insufficient documentation

## 2012-12-14 DIAGNOSIS — F17200 Nicotine dependence, unspecified, uncomplicated: Secondary | ICD-10-CM | POA: Insufficient documentation

## 2012-12-14 DIAGNOSIS — F1010 Alcohol abuse, uncomplicated: Secondary | ICD-10-CM | POA: Insufficient documentation

## 2012-12-14 DIAGNOSIS — F319 Bipolar disorder, unspecified: Secondary | ICD-10-CM | POA: Insufficient documentation

## 2012-12-14 DIAGNOSIS — M545 Low back pain, unspecified: Secondary | ICD-10-CM | POA: Insufficient documentation

## 2012-12-14 DIAGNOSIS — Z8781 Personal history of (healed) traumatic fracture: Secondary | ICD-10-CM | POA: Insufficient documentation

## 2012-12-14 DIAGNOSIS — Z79899 Other long term (current) drug therapy: Secondary | ICD-10-CM | POA: Insufficient documentation

## 2012-12-14 DIAGNOSIS — M109 Gout, unspecified: Secondary | ICD-10-CM | POA: Insufficient documentation

## 2012-12-14 DIAGNOSIS — R51 Headache: Secondary | ICD-10-CM | POA: Insufficient documentation

## 2012-12-14 DIAGNOSIS — M549 Dorsalgia, unspecified: Secondary | ICD-10-CM

## 2012-12-14 DIAGNOSIS — J40 Bronchitis, not specified as acute or chronic: Secondary | ICD-10-CM | POA: Insufficient documentation

## 2012-12-14 DIAGNOSIS — Z8719 Personal history of other diseases of the digestive system: Secondary | ICD-10-CM | POA: Insufficient documentation

## 2012-12-14 MED ORDER — OXYCODONE-ACETAMINOPHEN 5-325 MG PO TABS
1.0000 | ORAL_TABLET | Freq: Once | ORAL | Status: AC
  Administered 2012-12-14: 1 via ORAL
  Filled 2012-12-14: qty 1

## 2012-12-14 MED ORDER — OXYCODONE-ACETAMINOPHEN 5-325 MG PO TABS: 1.0000 | ORAL_TABLET | Freq: Four times a day (QID) | ORAL | Status: AC | PRN

## 2012-12-14 MED ORDER — MECLIZINE HCL 25 MG PO TABS: 25.0000 mg | ORAL_TABLET | Freq: Three times a day (TID) | ORAL | Status: DC | PRN

## 2012-12-14 MED ORDER — MECLIZINE HCL 12.5 MG PO TABS
25.0000 mg | ORAL_TABLET | Freq: Once | ORAL | Status: AC
  Administered 2012-12-14: 25 mg via ORAL
  Filled 2012-12-14: qty 2

## 2012-12-14 NOTE — Patient Instructions (Addendum)
20 [name] [name]  12/14/2012   Your procedure is scheduled on:  December 24, 2012  Report to [facility] at [id number].  Call this number if you have problems the morning of surgery: [phone]   Remember:   Do not eat food:After Midnight.  May have clear liquids:until Midnight .  Clear liquids include soda, tea, black coffee, apple or grape juice, broth.  Take these medicines the morning of surgery with A SIP OF WATER: Norvasc, Flexeril,Antivert,Zofran,Percocet,Protonix,Flomax,Celexa,Albuterol. Bring albuterol with you to hospital.   Do not wear jewelry, make-up or nail polish.  Do not wear lotions, powders, or perfumes. You may wear deodorant.  Do not shave 48 hours prior to surgery. Men may shave face and neck.  Do not bring valuables to the hospital.  Contacts, dentures or bridgework may not be worn into surgery.  Leave suitcase in the car. After surgery it may be brought to your room.  For patients admitted to the hospital, checkout time is 11:00 AM the day of discharge.   Patients discharged the day of surgery will not be allowed to drive home.  Name and phone number of your driver: Family or friend    Please read over the following fact sheets that you were given: Pain Booklet, Coughing and Deep Breathing, Surgical Site Infection Prevention, Anesthesia Post-op Instructions and Care and Recovery After Surgery PATIENT INSTRUCTIONS POST-ANESTHESIA  IMMEDIATELY FOLLOWING SURGERY:  Do not drive or operate machinery for the first twenty four hours after surgery.  Do not make any important decisions for twenty four hours after surgery or while taking narcotic pain medications or sedatives.  If you develop intractable nausea and vomiting or a severe headache please notify your doctor immediately.  FOLLOW-UP:  Please make an appointment with your surgeon as instructed. You do not need to follow up with anesthesia unless specifically instructed to do so.  WOUND CARE INSTRUCTIONS (if applicable):   Keep a dry clean dressing on the anesthesia/puncture wound site if there is drainage.  Once the wound has quit draining you may leave it open to air.  Generally you should leave the bandage intact for twenty four hours unless there is drainage.  If the epidural site drains for more than 36-48 hours please call the anesthesia department.  QUESTIONS?:  Please feel free to call your physician or the hospital operator if you have any questions, and they will be happy to assist you.     Colonoscopy A colonoscopy is an exam to evaluate your entire colon. In this exam, your colon is cleansed. A long fiberoptic tube is inserted through your rectum and into your colon. The fiberoptic scope (endoscope) is a long bundle of enclosed and very flexible fibers. These fibers transmit light to the area examined and send images from that area to your caregiver. Discomfort is usually minimal. You may be given a drug to help you sleep (sedative) during or prior to the procedure. This exam helps to detect lumps (tumors), polyps, inflammation, and areas of bleeding. Your caregiver may also take a small piece of tissue (biopsy) that will be examined under a microscope. LET YOUR CAREGIVER KNOW ABOUT:   Allergies to food or medicine.  Medicines taken, including vitamins, herbs, eyedrops, over-the-counter medicines, and creams.  Use of steroids (by mouth or creams).  Previous problems with anesthetics or numbing medicines.  History of bleeding problems or blood clots.  Previous surgery.  Other health problems, including diabetes and kidney problems.  Possibility of pregnancy, if this applies. BEFORE  THE PROCEDURE   A clear liquid diet may be required for 2 days before the exam.  Ask your caregiver about changing or stopping your regular medications.  Liquid injections (enemas) or laxatives may be required.  A large amount of electrolyte solution may be given to you to drink over a short period of time. This  solution is used to clean out your colon.  You should be present 60 minutes prior to your procedure or as directed by your caregiver. AFTER THE PROCEDURE   If you received a sedative or pain relieving medication, you will need to arrange for someone to drive you home.  Occasionally, there is a little blood passed with the first bowel movement. Do not be concerned. FINDING OUT THE RESULTS OF YOUR TEST Not all test results are available during your visit. If your test results are not back during the visit, make an appointment with your caregiver to find out the results. Do not assume everything is normal if you have not heard from your caregiver or the medical facility. It is important for you to follow up on all of your test results. HOME CARE INSTRUCTIONS   It is not unusual to pass moderate amounts of gas and experience mild abdominal cramping following the procedure. This is due to air being used to inflate your colon during the exam. Walking or a warm pack on your belly (abdomen) may help.  You may resume all normal meals and activities after sedatives and medicines have worn off.  Only take over-the-counter or prescription medicines for pain, discomfort, or fever as directed by your caregiver. Do not use aspirin or blood thinners if a biopsy was taken. Consult your caregiver for medicine usage if biopsies were taken. SEEK IMMEDIATE MEDICAL CARE IF:   You have a fever.  You pass large blood clots or fill a toilet with blood following the procedure. This may also occur 10 to 14 days following the procedure. This is more likely if a biopsy was taken.  You develop abdominal pain that keeps getting worse and cannot be relieved with medicine. Upper GI Endoscopy Upper GI endoscopy means using a flexible scope to look at the esophagus, stomach, and upper small bowel. This is done to make a diagnosis in people with heartburn, abdominal pain, or abnormal bleeding. Sometimes an endoscope is needed  to remove foreign bodies or food that become stuck in the esophagus; it can also be used to take biopsy samples. For the best results, do not eat or drink for 8 hours before having your upper endoscopy.  To perform the endoscopy, you will probably be sedated and your throat will be numbed with a special spray. The endoscope is then slowly passed down your throat (this will not interfere with your breathing). An endoscopy exam takes 15 to 30 minutes to complete and there is no real pain. Patients rarely remember much about the procedure. The results of the test may take several days if a biopsy or other test is taken.  You may have a sore throat after an endoscopy exam. Serious complications are very rare. Stick to liquids and soft foods until your pain is better. Do not drive a car or operate any dangerous equipment for at least 24 hours after being sedated. SEEK IMMEDIATE MEDICAL CARE IF:   You have severe throat pain.   You have shortness of breath.   You have bleeding problems.   You have a fever.   You have difficulty recovering from your  sedation.

## 2012-12-14 NOTE — ED Notes (Signed)
Low back pain, headache, dizzy, "sweats".  Cough, no fever.  Nausea,

## 2012-12-14 NOTE — ED Provider Notes (Signed)
History     CSN: [id number]  Arrival date & time 12/14/12  1015   First MD Initiated Contact with Patient 12/14/12 1057      Chief Complaint  Patient presents with  . Dizziness    (Consider location/radiation/quality/duration/timing/severity/associated sxs/prior treatment) Patient is a 51 y.o. male presenting with back pain. The history is provided by the patient.  Back Pain  This is a chronic problem. The problem occurs daily. The problem has been gradually worsening. The pain is associated with no known injury. The pain is present in the lumbar spine. The quality of the pain is described as shooting and aching. The pain does not radiate. The pain is moderate. The symptoms are aggravated by certain positions. The pain is the same all the time. Associated symptoms include headaches. Pertinent negatives include no chest pain, no fever, no abdominal pain, no bowel incontinence, no perianal numbness, no bladder incontinence and no dysuria. Associated symptoms comments: Headache both temporal areas. Treatments tried: antihypertensive medication.    Past Medical History  Diagnosis Date  . Bronchitis   . Acid reflux   . Pancreatitis     March 2013  . Gout   . Bipolar disorder   . Hyperlipidemia   . Alcohol abuse     6-8 cans of beer daily  . Pseudocyst of pancreas 02/28/2012  . Arthritis   . Fracture of lower leg     Past Surgical History  Procedure Date  . Nose surgery     broken nose  . Circumcision 03/17/2012    Procedure: CIRCUMCISION ADULT;  Surgeon: Ky Barban, MD;  Location: [facility];  Service: Urology;  Laterality: N/A;    Family History  Problem Relation Age of Onset  . Diabetes Father   . Anesthesia problems Neg Hx   . Hypotension Neg Hx   . Malignant hyperthermia Neg Hx   . Pseudochol deficiency Neg Hx   . Colon cancer Neg Hx     History  Substance Use Topics  . Smoking status: Current Every Day Smoker -- 0.5 packs/day for 25 years    Types: Cigarettes   . Smokeless tobacco: Not on file  . Alcohol Use: 0.0 oz/week     Comment: 6-8 beers a day      Review of Systems  Constitutional: Negative for fever and activity change.       All ROS Neg except as noted in HPI  HENT: Negative for nosebleeds and neck pain.   Eyes: Negative for photophobia and discharge.  Respiratory: Negative for cough, shortness of breath and wheezing.   Cardiovascular: Negative for chest pain and palpitations.  Gastrointestinal: Negative for abdominal pain, blood in stool and bowel incontinence.  Genitourinary: Negative for bladder incontinence, dysuria, frequency and hematuria.  Musculoskeletal: Positive for back pain and arthralgias.  Skin: Negative.   Neurological: Positive for headaches. Negative for dizziness, seizures and speech difficulty.  Psychiatric/Behavioral: Negative for hallucinations and confusion.    Allergies  Penicillins  Home Medications   Current Outpatient Rx  Name  Route  Sig  Dispense  Refill  . ALBUTEROL SULFATE HFA 108 (90 BASE) MCG/ACT IN AERS   Inhalation   Inhale 2 puffs into the lungs every 4 (four) hours as needed for wheezing.   1 Inhaler   1   . AMLODIPINE BESYLATE 5 MG PO TABS   Oral   Take 1 tablet (5 mg total) by mouth daily.   30 tablet   4   . CITALOPRAM HYDROBROMIDE  20 MG PO TABS   Oral   Take 1 tablet (20 mg total) by mouth daily.   30 tablet   2   . CYCLOBENZAPRINE HCL 10 MG PO TABS   Oral   Take 1 tablet (10 mg total) by mouth 3 (three) times daily as needed. For muscle spasms   30 tablet   3   . FEXOFENADINE HCL 180 MG PO TABS   Oral   Take 1 tablet (180 mg total) by mouth daily.   30 tablet   6   . OMEGA-3 FATTY ACIDS 1000 MG PO CAPS   Oral   Take 1 g by mouth daily.          Marland Kitchen GEMFIBROZIL 600 MG PO TABS   Oral   Take 600 mg by mouth 2 (two) times daily before a meal.         . IBUPROFEN 200 MG PO TABS   Oral   Take 600 mg by mouth every 6 (six) hours as needed. Pain         .  MELOXICAM 7.5 MG PO TABS   Oral   Take 7.5 mg by mouth 2 (two) times daily.          . ADULT MULTIVITAMIN W/MINERALS CH   Oral   Take 1 tablet by mouth daily.         Marland Kitchen ONDANSETRON HCL 4 MG PO TABS   Oral   Take 1 tablet (4 mg total) by mouth every 6 (six) hours as needed for nausea.   20 tablet   0   . PANTOPRAZOLE SODIUM 40 MG PO TBEC   Oral   Take 1 tablet (40 mg total) by mouth 2 (two) times daily.   60 tablet   3   . PROMETHAZINE HCL 12.5 MG PO TABS   Oral   Take 1 tablet (12.5 mg total) by mouth every 6 (six) hours as needed for nausea.   15 tablet   0   . TAMSULOSIN HCL 0.4 MG PO CAPS   Oral   Take 1 capsule (0.4 mg total) by mouth daily.   30 capsule   6   . TRAZODONE HCL 100 MG PO TABS   Oral   Take 100 mg by mouth at bedtime as needed. Sleep         . PEG 3350-KCL-NA BICARB-NACL 420 G PO SOLR   Oral   Take 4,000 mLs by mouth as directed.   4000 mL   0   . TADALAFIL 5 MG PO TABS   Oral   Take 1 tablet (5 mg total) by mouth daily as needed for erectile dysfunction. And BPH   30 tablet   0     Free coupon     BP 134/94  Pulse 80  Temp 97.4 F (36.3 C) (Oral)  Resp 18  Ht 5' 10.5" (1.791 m)  Wt 180 lb (81.647 kg)  BMI 25.46 kg/m2  SpO2 100%  Physical Exam  Nursing note and vitals reviewed. Constitutional: He is oriented to person, place, and time. He appears well-developed and well-nourished.  Non-toxic appearance.  HENT:  Head: Normocephalic.  Right Ear: Tympanic membrane and external ear normal.  Left Ear: Tympanic membrane and external ear normal.  Eyes: EOM and lids are normal. Pupils are equal, round, and reactive to light.  Neck: Normal range of motion. Neck supple. Carotid bruit is not present.  Cardiovascular: Normal rate, regular rhythm, normal heart sounds, intact  distal pulses and normal pulses.   Pulmonary/Chest: Breath sounds normal. No respiratory distress.  Abdominal: Soft. Bowel sounds are normal. There is no  tenderness. There is no guarding.  Musculoskeletal: Normal range of motion.  Lymphadenopathy:       Head (right side): No submandibular adenopathy present.       Head (left side): No submandibular adenopathy present.    He has no cervical adenopathy.  Neurological: He is alert and oriented to person, place, and time. He has normal strength. No cranial nerve deficit or sensory deficit.  Skin: Skin is warm and dry.  Psychiatric: He has a normal mood and affect. His speech is normal.    ED Course  Procedures (including critical care time)  Labs Reviewed - No data to display No results found.   No diagnosis found.    MDM  I have reviewed nursing notes, vital signs, and all appropriate lab and imaging results for this patient. Vital signs wnl. No acute exam changes noted on todays exam.  Pt has had problem with low back pain and headache in the past, this pain is similar. Pt treated with antivert and percocet. He is to see his primary MD next week.       Kathie Dike, [place] 12/15/12 [phone]

## 2012-12-15 NOTE — ED Provider Notes (Signed)
Medical screening examination/treatment/procedure(s) were performed by non-physician practitioner and as supervising physician I was immediately available for consultation/collaboration.    Meridian Scherger R Riki Gehring, MD 12/15/12 2239 

## 2012-12-18 ENCOUNTER — Encounter (HOSPITAL_COMMUNITY)
Admission: RE | Admit: 2012-12-18 | Discharge: 2012-12-18 | Disposition: A | Discharge status: Home / Self Care | Payer: Medicare Other | Source: Ambulatory Visit | Attending: Internal Medicine | Admitting: Internal Medicine

## 2012-12-18 ENCOUNTER — Encounter (HOSPITAL_COMMUNITY): Payer: Self-pay

## 2012-12-18 HISTORY — DX: Essential (primary) hypertension: I10

## 2012-12-18 LAB — HEMOGLOBIN AND HEMATOCRIT, BLOOD
HCT: 43.5 % (ref 39.0–52.0)
Hemoglobin: 14.5 g/dL (ref 13.0–17.0)

## 2012-12-18 LAB — BASIC METABOLIC PANEL
BUN: 14 mg/dL (ref 6–23)
CO2: 31 mEq/L (ref 19–32)
Calcium: 10 mg/dL (ref 8.4–10.5)
Chloride: 100 mEq/L (ref 96–112)
Creatinine, Ser: 1.1 mg/dL (ref 0.50–1.35)
GFR calc Af Amer: 89 mL/min — ABNORMAL LOW (ref >90)
GFR calc non Af Amer: 77 mL/min — ABNORMAL LOW (ref >90)
Glucose, Bld: 51 mg/dL — ABNORMAL LOW (ref 70–99)
Potassium: 4.4 mEq/L (ref 3.5–5.1)
Sodium: 140 mEq/L (ref 135–145)

## 2012-12-19 ENCOUNTER — Encounter (HOSPITAL_COMMUNITY): Payer: Self-pay | Admitting: *Deleted

## 2012-12-19 ENCOUNTER — Emergency Department (HOSPITAL_COMMUNITY): Payer: Medicare Other

## 2012-12-19 ENCOUNTER — Emergency Department (HOSPITAL_COMMUNITY)
Admission: EM | Admit: 2012-12-19 | Discharge: 2012-12-20 | Disposition: A | Discharge status: Home / Self Care | Payer: Medicare Other | Attending: Emergency Medicine | Admitting: Emergency Medicine

## 2012-12-19 DIAGNOSIS — Y929 Unspecified place or not applicable: Secondary | ICD-10-CM | POA: Insufficient documentation

## 2012-12-19 DIAGNOSIS — M129 Arthropathy, unspecified: Secondary | ICD-10-CM | POA: Insufficient documentation

## 2012-12-19 DIAGNOSIS — S60219A Contusion of unspecified wrist, initial encounter: Secondary | ICD-10-CM | POA: Insufficient documentation

## 2012-12-19 DIAGNOSIS — J40 Bronchitis, not specified as acute or chronic: Secondary | ICD-10-CM | POA: Insufficient documentation

## 2012-12-19 DIAGNOSIS — E785 Hyperlipidemia, unspecified: Secondary | ICD-10-CM | POA: Insufficient documentation

## 2012-12-19 DIAGNOSIS — F1010 Alcohol abuse, uncomplicated: Secondary | ICD-10-CM | POA: Insufficient documentation

## 2012-12-19 DIAGNOSIS — F319 Bipolar disorder, unspecified: Secondary | ICD-10-CM | POA: Insufficient documentation

## 2012-12-19 DIAGNOSIS — Z79899 Other long term (current) drug therapy: Secondary | ICD-10-CM | POA: Insufficient documentation

## 2012-12-19 DIAGNOSIS — I10 Essential (primary) hypertension: Secondary | ICD-10-CM | POA: Insufficient documentation

## 2012-12-19 DIAGNOSIS — Z8781 Personal history of (healed) traumatic fracture: Secondary | ICD-10-CM | POA: Insufficient documentation

## 2012-12-19 DIAGNOSIS — S60211A Contusion of right wrist, initial encounter: Secondary | ICD-10-CM

## 2012-12-19 DIAGNOSIS — S59909A Unspecified injury of unspecified elbow, initial encounter: Secondary | ICD-10-CM | POA: Insufficient documentation

## 2012-12-19 DIAGNOSIS — Y9389 Activity, other specified: Secondary | ICD-10-CM | POA: Insufficient documentation

## 2012-12-19 DIAGNOSIS — M109 Gout, unspecified: Secondary | ICD-10-CM | POA: Insufficient documentation

## 2012-12-19 DIAGNOSIS — K219 Gastro-esophageal reflux disease without esophagitis: Secondary | ICD-10-CM | POA: Insufficient documentation

## 2012-12-19 DIAGNOSIS — F17200 Nicotine dependence, unspecified, uncomplicated: Secondary | ICD-10-CM | POA: Insufficient documentation

## 2012-12-19 DIAGNOSIS — Z8719 Personal history of other diseases of the digestive system: Secondary | ICD-10-CM | POA: Insufficient documentation

## 2012-12-19 DIAGNOSIS — S6990XA Unspecified injury of unspecified wrist, hand and finger(s), initial encounter: Secondary | ICD-10-CM | POA: Insufficient documentation

## 2012-12-19 DIAGNOSIS — X500XXA Overexertion from strenuous movement or load, initial encounter: Secondary | ICD-10-CM | POA: Insufficient documentation

## 2012-12-19 DIAGNOSIS — M79632 Pain in left forearm: Secondary | ICD-10-CM
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
## 2012-12-19 NOTE — ED Notes (Signed)
Pt c/o left elbow pain/arm pain and right hand pain. No known injury

## 2012-12-20 MED ORDER — NAPROXEN 500 MG PO TABS: 500.0000 mg | ORAL_TABLET | Freq: Two times a day (BID) | ORAL | Status: DC

## 2012-12-20 NOTE — ED Provider Notes (Signed)
Medical screening examination/treatment/procedure(s) were performed by non-physician practitioner and as supervising physician I was immediately available for consultation/collaboration.  Kioni Stahl, MD 12/20/12 1515 
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
## 2012-12-21 NOTE — Progress Notes (Signed)
UR Chart Review Completed  

## 2012-12-22 ENCOUNTER — Other Ambulatory Visit: Payer: Self-pay | Admitting: *Deleted

## 2012-12-22 ENCOUNTER — Ambulatory Visit (INDEPENDENT_AMBULATORY_CARE_PROVIDER_SITE_OTHER): Payer: Medicare Other | Admitting: Orthopedic Surgery

## 2012-12-22 ENCOUNTER — Encounter: Payer: Self-pay | Admitting: Orthopedic Surgery

## 2012-12-22 VITALS — BP 120/90 | Ht 69.5 in | Wt 183.2 lb

## 2012-12-22 DIAGNOSIS — M7710 Lateral epicondylitis, unspecified elbow: Secondary | ICD-10-CM

## 2012-12-22 MED ORDER — NAPROXEN 500 MG PO TABS: 500.0000 mg | ORAL_TABLET | Freq: Two times a day (BID) | ORAL | Status: DC

## 2012-12-22 NOTE — Progress Notes (Signed)
Patient ID: [name] [name], male   DOB: Oct 04, 1961, 51 y.o.   MRN: [id number] Chief Complaint  Patient presents with  . Elbow Pain    left elbow pain and right hand pain d/t injury 12/15/12      This patient presents with a one-week history of 8/10 sharp constant lateral elbow pain radiating into his left wrist associated with power grip maneuvers and some mild tingling  He has a history of chronic back pain for which she takes Percocet on occasion. He has a history of unexpected weight loss heartburn and nausea itching and dizziness otherwise his review of systems is negative  His medical history reviewed and recorded  Physical exam BP 120/90  Ht 5' 9.5" (1.765 m)  Wt 183 lb 3.2 oz (83.099 kg)  BMI 26.67 kg/m2 Gen. exam he is well-developed and well-nourished grooming and hygiene are normal he is a muscular filled. He is awake alert and oriented x3 his mood and affect are normal. His gait and station are normal. The left elbow has tenderness over the lateral epicondyle and upper forearm. He has lost 20 of elbow extension is painful range of motion throughout. His elbow is stable strength is normal skin is intact he has good pulses no lymphadenopathy in the epitrochlear region normal sensation throughout the left upper extremity no pathologic reflexes and overall balance is normal   an x-ray at the hospital it was negative  Diagnosis tennis elbow  Plan injection brace Naprosyn for 4 more weeks  Followup for repeat exam  Patient counseled to stop taking Percocet for back pain and it further treatment if needed he said he's had therapy he scheduled for pain management which is definitely in order. He was given some more Naprosyn to take

## 2012-12-22 NOTE — Patient Instructions (Addendum)
Tennis elbow:  You have received a steroid shot. 15% of patients experience increased pain at the injection site with in the next 24 hours. This is best treated with ice and tylenol extra strength 2 tabs every 8 hours. If you are still having pain please call the office.   Wear brace x 6 weeks

## 2012-12-24 ENCOUNTER — Ambulatory Visit (HOSPITAL_COMMUNITY): Payer: Medicare Other | Admitting: Anesthesiology

## 2012-12-24 ENCOUNTER — Encounter (HOSPITAL_COMMUNITY)
Admission: RE | Disposition: A | Discharge status: Home / Self Care | Payer: Self-pay | Source: Ambulatory Visit | Attending: Internal Medicine

## 2012-12-24 ENCOUNTER — Encounter (HOSPITAL_COMMUNITY): Payer: Self-pay | Admitting: *Deleted

## 2012-12-24 ENCOUNTER — Encounter (HOSPITAL_COMMUNITY): Payer: Self-pay | Admitting: Anesthesiology

## 2012-12-24 ENCOUNTER — Ambulatory Visit (HOSPITAL_COMMUNITY)
Admission: RE | Admit: 2012-12-24 | Discharge: 2012-12-24 | Disposition: A | Discharge status: Home / Self Care | Payer: Medicare Other | Source: Ambulatory Visit | Attending: Internal Medicine | Admitting: Internal Medicine

## 2012-12-24 DIAGNOSIS — K3189 Other diseases of stomach and duodenum: Secondary | ICD-10-CM | POA: Insufficient documentation

## 2012-12-24 DIAGNOSIS — K2960 Other gastritis without bleeding: Secondary | ICD-10-CM

## 2012-12-24 DIAGNOSIS — Z1211 Encounter for screening for malignant neoplasm of colon: Secondary | ICD-10-CM | POA: Insufficient documentation

## 2012-12-24 DIAGNOSIS — Z79899 Other long term (current) drug therapy: Secondary | ICD-10-CM | POA: Insufficient documentation

## 2012-12-24 DIAGNOSIS — K5730 Diverticulosis of large intestine without perforation or abscess without bleeding: Secondary | ICD-10-CM | POA: Insufficient documentation

## 2012-12-24 DIAGNOSIS — D126 Benign neoplasm of colon, unspecified: Secondary | ICD-10-CM

## 2012-12-24 DIAGNOSIS — Z01812 Encounter for preprocedural laboratory examination: Secondary | ICD-10-CM | POA: Insufficient documentation

## 2012-12-24 DIAGNOSIS — Z01810 Encounter for preprocedural cardiovascular examination: Secondary | ICD-10-CM | POA: Insufficient documentation

## 2012-12-24 DIAGNOSIS — K319 Disease of stomach and duodenum, unspecified: Secondary | ICD-10-CM | POA: Insufficient documentation

## 2012-12-24 DIAGNOSIS — K6289 Other specified diseases of anus and rectum: Secondary | ICD-10-CM

## 2012-12-24 HISTORY — PX: ESOPHAGOGASTRODUODENOSCOPY (EGD) WITH PROPOFOL: SHX5813

## 2012-12-24 HISTORY — PX: COLONOSCOPY WITH PROPOFOL: SHX5780

## 2012-12-24 LAB — GLUCOSE, CAPILLARY: Glucose-Capillary: 75 mg/dL (ref 70–99)

## 2012-12-24 LAB — RAPID URINE DRUG SCREEN, HOSP PERFORMED
Amphetamines: NOT DETECTED
Barbiturates: NOT DETECTED
Benzodiazepines: NOT DETECTED
Cocaine: NOT DETECTED
Opiates: NOT DETECTED
Tetrahydrocannabinol: NOT DETECTED

## 2012-12-24 SURGERY — COLONOSCOPY WITH PROPOFOL
Anesthesia: Monitor Anesthesia Care | Site: Rectum

## 2012-12-24 MED ORDER — MIDAZOLAM HCL 2 MG/2ML IJ SOLN
INTRAMUSCULAR | Status: AC
  Filled 2012-12-24: qty 4

## 2012-12-24 MED ORDER — PROPOFOL 10 MG/ML IV EMUL
INTRAVENOUS | Status: AC
  Filled 2012-12-24: qty 40

## 2012-12-24 MED ORDER — ONDANSETRON HCL 4 MG/2ML IJ SOLN
INTRAMUSCULAR | Status: AC
  Filled 2012-12-24: qty 2

## 2012-12-24 MED ORDER — MIDAZOLAM HCL 5 MG/5ML IJ SOLN
INTRAMUSCULAR | Status: DC | PRN
  Administered 2012-12-24 (×2): 1 mg via INTRAVENOUS

## 2012-12-24 MED ORDER — WATER FOR IRRIGATION, STERILE IR SOLN
Status: DC | PRN
  Administered 2012-12-24: 1000 mL

## 2012-12-24 MED ORDER — BUTAMBEN-TETRACAINE-BENZOCAINE 2-2-14 % EX AERO
1.0000 | INHALATION_SPRAY | Freq: Once | CUTANEOUS | Status: AC
  Administered 2012-12-24: 1 via TOPICAL
  Filled 2012-12-24: qty 56

## 2012-12-24 MED ORDER — PROPOFOL INFUSION 10 MG/ML OPTIME
INTRAVENOUS | Status: DC | PRN
  Administered 2012-12-24 (×2): 85 ug/kg/min via INTRAVENOUS

## 2012-12-24 MED ORDER — LACTATED RINGERS IV SOLN
INTRAVENOUS | Status: DC
  Administered 2012-12-24: 1000 mL via INTRAVENOUS

## 2012-12-24 MED ORDER — MIDAZOLAM HCL 2 MG/2ML IJ SOLN
INTRAMUSCULAR | Status: AC
  Filled 2012-12-24: qty 2

## 2012-12-24 MED ORDER — ONDANSETRON HCL 4 MG/2ML IJ SOLN
4.0000 mg | Freq: Once | INTRAMUSCULAR | Status: AC
  Administered 2012-12-24: 4 mg via INTRAVENOUS

## 2012-12-24 MED ORDER — GLYCOPYRROLATE 0.2 MG/ML IJ SOLN
0.2000 mg | Freq: Once | INTRAMUSCULAR | Status: AC
  Administered 2012-12-24: 0.2 mg via INTRAVENOUS

## 2012-12-24 MED ORDER — STERILE WATER FOR IRRIGATION IR SOLN
Status: DC | PRN
  Administered 2012-12-24: 08:00:00

## 2012-12-24 MED ORDER — GLYCOPYRROLATE 0.2 MG/ML IJ SOLN
INTRAMUSCULAR | Status: AC
  Filled 2012-12-24: qty 1

## 2012-12-24 MED ORDER — ONDANSETRON HCL 4 MG/2ML IJ SOLN: 4.0000 mg | Freq: Once | INTRAMUSCULAR | Status: DC | PRN

## 2012-12-24 MED ORDER — MIDAZOLAM HCL 2 MG/2ML IJ SOLN
1.0000 mg | INTRAMUSCULAR | Status: DC | PRN
  Administered 2012-12-24: 2 mg via INTRAVENOUS

## 2012-12-24 MED ORDER — FENTANYL CITRATE 0.05 MG/ML IJ SOLN: 25.0000 ug | INTRAMUSCULAR | Status: DC | PRN

## 2012-12-24 SURGICAL SUPPLY — 24 items
BLOCK BITE 60FR ADLT L/F BLUE (MISCELLANEOUS) ×1 IMPLANT
DEVICE CLIP HEMOSTAT 235CM (CLIP) IMPLANT
ELECT REM PT RETURN 9FT ADLT (ELECTROSURGICAL) ×3
ELECTRODE REM PT RTRN 9FT ADLT (ELECTROSURGICAL) IMPLANT
FCP BXJMBJMB 240X2.8X (CUTTING FORCEPS)
FLOOR PAD 36X40 (MISCELLANEOUS) ×3
FORCEPS BIOP RAD 4 LRG CAP 4 (CUTTING FORCEPS) ×1 IMPLANT
FORCEPS BIOP RJ4 240 W/NDL (CUTTING FORCEPS)
FORCEPS BXJMBJMB 240X2.8X (CUTTING FORCEPS) IMPLANT
INJECTOR/SNARE I SNARE (MISCELLANEOUS) IMPLANT
LUBRICANT JELLY 4.5OZ STERILE (MISCELLANEOUS) ×1 IMPLANT
MANIFOLD NEPTUNE II (INSTRUMENTS) ×1 IMPLANT
NDL SCLEROTHERAPY 25GX240 (NEEDLE) IMPLANT
NEEDLE SCLEROTHERAPY 25GX240 (NEEDLE) IMPLANT
PAD FLOOR 36X40 (MISCELLANEOUS) IMPLANT
PROBE APC STR FIRE (PROBE) IMPLANT
PROBE INJECTION GOLD (MISCELLANEOUS)
PROBE INJECTION GOLD 7FR (MISCELLANEOUS) IMPLANT
SNARE ROTATE MED OVAL 20MM (MISCELLANEOUS) ×1 IMPLANT
SYR 50ML LL SCALE MARK (SYRINGE) ×1 IMPLANT
TRAP SPECIMEN MUCOUS 40CC (MISCELLANEOUS) ×2 IMPLANT
TUBING ENDO SMARTCAP PENTAX (MISCELLANEOUS) ×1 IMPLANT
TUBING IRRIGATION ENDOGATOR (MISCELLANEOUS) ×1 IMPLANT
WATER STERILE IRR 1000ML POUR (IV SOLUTION) ×1 IMPLANT

## 2012-12-24 NOTE — Anesthesia Preprocedure Evaluation (Addendum)
Anesthesia Evaluation  Patient identified by MRN, date of birth, ID band Patient awake    Reviewed: Allergy & Precautions, H&P , NPO status , Patient's Chart, lab work & pertinent test results  Airway Mallampati: II  Neck ROM: Full    Dental  (+) Poor Dentition, Missing, Loose, Chipped and Dental Advisory Given   Pulmonary COPDCurrent Smoker,    + decreased breath sounds      Cardiovascular hypertension, Rhythm:Regular Rate:Normal     Neuro/Psych PSYCHIATRIC DISORDERS Bipolar Disorder    GI/Hepatic GERD-  Medicated and Controlled,(+)     substance abuse (PANCREATITIS)  alcohol use,   Endo/Other    Renal/GU      Musculoskeletal   Abdominal   Peds  Hematology   Anesthesia Other Findings   Reproductive/Obstetrics                           Anesthesia Physical Anesthesia Plan  ASA: III  Anesthesia Plan: MAC   Post-op Pain Management:    Induction: Intravenous  Airway Management Planned: Simple Face Mask  Additional Equipment:   Intra-op Plan:   Post-operative Plan:   Informed Consent: I have reviewed the patients History and Physical, chart, labs and discussed the procedure including the risks, benefits and alternatives for the proposed anesthesia with the patient or authorized representative who has indicated his/her understanding and acceptance.     Plan Discussed with:   Anesthesia Plan Comments:         Anesthesia Quick Evaluation

## 2012-12-24 NOTE — Op Note (Signed)
NAMEAYOUB, AREY                 ACCOUNT NO.:  [id number]  MEDICAL RECORD NO.:  [id number]  LOCATION:  [facility]                          FACILITY:  [facility]  PHYSICIAN:  R. Roetta Sessions, MD FACP FACGDATE OF BIRTH:  January 17, 1961  DATE OF PROCEDURE:  12/24/2012 DATE OF DISCHARGE:  12/24/2012                              OPERATIVE REPORT   PROCEDURE:  EGD with gastric biopsy followed by colonoscopy and snare polypectomy and biopsy.  INDICATIONS FOR PROCEDURE:  A 52 year old gentleman with symptoms consistent with dyspepsia in the setting of polysubstance abuse.  He comes for EGD for further evaluation.  Also, he is due for first ever screening colonoscopy.  Risks, benefits, limitations, alternatives, imponderables have been discussed with the patient.  His questions have been answered.  He is agreeable.  Because of his polypharmacy and psychiatric issues, he is being done under deep sedation with the help of Dr. Marcos Eke and associates.  PROCEDURE NOTE:  O2 saturation, blood pressure, pulse, respirations were monitored throughout the entire procedure.  ANESTHESIA:  Deep sedation with propofol per Dr. Marcos Eke.  INSTRUMENTATION:  Pentax video chip system.  Cetacaine spray for topical pharyngeal anesthesia.  FINDINGS:  EGD:  Examination of tubular esophagus revealed normal esophageal mucosa.  There are no esophageal varices.  EG junction easily traversed.  Stomach:  Gastric cavity was emptied, insufflated well with air. Thorough examination of the gastric mucosa including retroflexion of proximal stomach, esophagogastric junction demonstrated a small hiatal hernia, patchy erythema, and a  couple of tiny gastric erosions.  There was no ulcer infiltrating process.  Pylorus was patent.  Examination of the bulb and the  second portion revealed no abnormalities.  Therapeutic/diagnostic Maneuvers Performed:  Biopsies of the gastric body and antrum were taken to assess for H. pylori.  The  patient tolerated this procedure well, was prepared for colonoscopy.  Digital rectal exam revealed no abnormalities.  Endoscopic Findings:  Prep was adequate.  Rectum:  Examination of rectal mucosa including retroflexed view of the anal verge demonstrated a 7-8 mm pedunculated lesion ball valving from the anal canal, looked to be more of a degenerating anal papilla than anything else, did not appear to be neoplasm, more clearly typical hemorrhoid.  We see multiple photographs; otherwise, the remainder of the rectal mucosa appeared normal.  Colon:  Colonic mucosa was surveyed from the rectosigmoid junction through the left transverse right colon to the appendiceal orifice, ileocecal valve/cecum.  These structures were seen and photographed. The rectum was vegetable material in the base of the cecum, which was moved out of the way by changing the patient's position with the help of gravity to see the cecum adequately.  This was done.  From this level, scope was slowly and cautiously withdrawn.  All previously discussed surfaces were again seen.  The patient had a few scattered narrow mouth sigmoid diverticula.  The patient also had a 5 mm and a second 9 mm pedunculated polyp in the mid sigmoid.  The remainder of colonic mucosa appeared normal.  Therapeutic/diagnostic maneuvers performed:  The two polyps at the sigmoid were hot snared, recovered by the pathologist.  The anorectal lesion was biopsied for the pathologist.  It was firm, somewhat hard. The patient did have some discomfort with this maneuver.  Overall, the patient tolerated the procedure well, was taken to PACU in stable condition.  Cecal withdrawal time 18 minutes.  IMPRESSION:  Esophagogastroduodenoscopy, gastric erythema and erosions of uncertain significance status post gastric biopsy.  COLONOSCOPY FINDINGS: 1. Anal lesion as described above status post biopsy; otherwise,     normal rectum. 2. Sigmoid  diverticulosis. 3. Sigmoid polyps -- resected as described above.  RECOMMENDATIONS: 1. Follow up on pathology. 2. Continue Protonix 40 mg orally twice daily. 3. The patient has been advised to refrain from using illicit drugs     and alcohol.     Jonathon Bellows, MD Caleen Essex     RMR/MEDQ  D:  12/24/2012  T:  12/24/2012  Job:  [id number]  cc:   Milinda Antis, MD

## 2012-12-24 NOTE — H&P (View-Only) (Signed)
Primary Care Physician:  Corona, KAWANTA, MD Primary Gastroenterologist:  Dr. Rourk   Chief Complaint  Patient presents with  . Abdominal Pain    HPI:   51-year-old male who presents today as an initial consult secondary to dyspepsia. He has a hx notable for pancreatitis, likely secondary to ETOH abuse and hypertriglyceridemia. Prior hospitalization earlier this year. He continues to drink about 6-8 beers per day. Last used marijuana and cocaine about a week ago. Presented to the ED last Tuesday after vomiting, profuse sweating, abdominal pain.   Mid abdomen "soreness" when throwing up. Notes nausea for last 2 weeks but getting better. Hx of chronic nausea first thing in morning. Worsened over past few weeks. Sometimes can't eat breakfast first thing because feels like will throw up. Lost a few lbs over the past few weeks. Usually stays around 183-185. Decreased appetite for the past few weeks. +reflux, just switched to Protonix from Prilosec, as felt like Prilosec wasn't working. Had increased Prilosec to BID and Tums. Protonix works well. No melena. No brbpr.   Meloxicam BID since last year. +BC powders intermittently for headaches but states rare. +constipation, new onset in past few weeks. Usually goes about 1-2 times per day, states "constipated". Further investigation reveals he has to strain, which makes him feel constipated.    No prior upper or lower GI evaluation.   CT in ED without acute finding, fatty liver, chronic splenic vein thrombosis.  Lipase normal, AST elevated 66, otherwise LFTs normal. Chronically elevated AST.   Lab Results  Component Value Date   ALT 20 11/17/2012   AST 66* 11/17/2012   ALKPHOS 60 11/17/2012   BILITOT 0.7 11/17/2012      Past Medical History  Diagnosis Date  . Bronchitis   . Acid reflux   . Pancreatitis     March 2013  . Gout   . Bipolar disorder   . Hyperlipidemia   . Alcohol abuse     6-8 cans of beer daily  . Pseudocyst of pancreas  02/28/2012  . Arthritis   . Fracture of lower leg     Past Surgical History  Procedure Date  . Nose surgery     broken nose  . Circumcision 03/17/2012    Procedure: CIRCUMCISION ADULT;  Surgeon: Mohammad I Javaid, MD;  Location: [facility];  Service: Urology;  Laterality: N/A;    Current Outpatient Prescriptions  Medication Sig Dispense Refill  . albuterol (PROVENTIL HFA;VENTOLIN HFA) 108 (90 BASE) MCG/ACT inhaler Inhale 2 puffs into the lungs every 4 (four) hours as needed for wheezing.  1 Inhaler  1  . amLODipine (NORVASC) 5 MG tablet Take 1 tablet (5 mg total) by mouth daily.  30 tablet  4  . citalopram (CELEXA) 20 MG tablet Take 1 tablet (20 mg total) by mouth daily.  30 tablet  2  . cyclobenzaprine (FLEXERIL) 10 MG tablet Take 1 tablet (10 mg total) by mouth 3 (three) times daily as needed. For muscle spasms  30 tablet  3  . fexofenadine (ALLEGRA) 180 MG tablet Take 1 tablet (180 mg total) by mouth daily.  30 tablet  6  . fish oil-omega-3 fatty acids 1000 MG capsule Take 1 g by mouth daily.       . gemfibrozil (LOPID) 600 MG tablet Take 1 tablet (600 mg total) by mouth 2 (two) times daily before a meal. FOR TREATMENT OF YOUR HIGH TRIGLYCERIDE LEVEL.  60 tablet  3  . ibuprofen (ADVIL,MOTRIN) 600 MG tablet Take   1 tablet (600 mg total) by mouth every 6 (six) hours as needed for pain.  30 tablet  0  . meloxicam (MOBIC) 7.5 MG tablet Take 7.5 mg by mouth 2 (two) times daily. 1 po bid with food      . ondansetron (ZOFRAN) 4 MG tablet Take 1 tablet (4 mg total) by mouth every 6 (six) hours as needed for nausea.  20 tablet  0  . oxyCODONE-acetaminophen (PERCOCET) 7.5-325 MG per tablet Take 1 tablet by mouth every 6 (six) hours as needed for pain.  60 tablet  0  . oxyCODONE-acetaminophen (PERCOCET/ROXICET) 5-325 MG per tablet Take 1 tablet by mouth every 4 (four) hours as needed for pain.  20 tablet  0  . pantoprazole (PROTONIX) 40 MG tablet Take 1 tablet (40 mg total) by mouth 2 (two) times daily.   60 tablet  3  . promethazine (PHENERGAN) 12.5 MG tablet Take 1 tablet (12.5 mg total) by mouth every 6 (six) hours as needed for nausea.  15 tablet  0  . tadalafil (CIALIS) 5 MG tablet Take 1 tablet (5 mg total) by mouth daily as needed for erectile dysfunction. And BPH  30 tablet  0  . Tamsulosin HCl (FLOMAX) 0.4 MG CAPS Take 1 capsule (0.4 mg total) by mouth daily.  30 capsule  6  . traZODone (DESYREL) 100 MG tablet Take 100 mg by mouth at bedtime as needed. Sleep      . [DISCONTINUED] pantoprazole (PROTONIX) 40 MG tablet Take 1 tablet (40 mg total) by mouth daily.  30 tablet  3  . polyethylene glycol-electrolytes (TRILYTE) 420 G solution Take 4,000 mLs by mouth as directed.  4000 mL  0    Allergies as of 11/26/2012 - Review Complete 11/26/2012  Allergen Reaction Noted  . Penicillins Itching 08/05/2011    Family History  Problem Relation Age of Onset  . Diabetes Father   . Anesthesia problems Neg Hx   . Hypotension Neg Hx   . Malignant hyperthermia Neg Hx   . Pseudochol deficiency Neg Hx   . Colon cancer Neg Hx     History   Social History  . Marital Status: Single    Spouse Name: N/A    Number of Children: N/A  . Years of Education: N/A   Occupational History  . unemployed    Social History Main Topics  . Smoking status: Current Every Day Smoker -- 0.5 packs/day for 25 years    Types: Cigarettes  . Smokeless tobacco: Not on file  . Alcohol Use: 0.0 oz/week     Comment: 6-8 beers a day  . Drug Use: No     Comment: hx of marijuana and cocaine use, last Friday  . Sexually Active: Not on file   Other Topics Concern  . Not on file   Social History Narrative  . No narrative on file    Review of Systems: Gen: Denies any fever, chills, fatigue, weight loss, lack of appetite.  CV: Denies chest pain, heart palpitations, peripheral edema, syncope.  Resp: Denies shortness of breath at rest or with exertion. Denies wheezing or cough.  GI: Denies dysphagia or odynophagia.  Denies jaundice, hematemesis, fecal incontinence. GU : Denies urinary burning, urinary frequency, urinary hesitancy MS: Denies joint pain, muscle weakness, cramps, or limitation of movement.  Derm: Denies rash, itching, dry skin Psych: Denies depression, anxiety, memory loss, and confusion Heme: Denies bruising, bleeding, and enlarged lymph nodes.  Physical Exam: BP 142/86  Pulse 96    Temp 98.4 F (36.9 C) (Oral)  Ht 5' 10.5" (1.791 m)  Wt 179 lb 6.4 oz (81.375 kg)  BMI 25.38 kg/m2 General:   Alert and oriented. Pleasant and cooperative. Well-nourished and well-developed.  Head:  Normocephalic and atraumatic. Eyes:  Without icterus, sclera clear and conjunctiva pink.  Ears:  Normal auditory acuity. Nose:  No deformity, discharge,  or lesions. Mouth:  No deformity or lesions, oral mucosa pink.  Neck:  Supple, without mass or thyromegaly. Lungs:  Clear to auscultation bilaterally. No wheezes, rales, or rhonchi. No distress.  Heart:  S1, S2 present without murmurs appreciated.  Abdomen:  +BS, soft, non-tender and non-distended. No HSM noted. No guarding or rebound. No masses appreciated.  Rectal:  Deferred  Msk:  Symmetrical without gross deformities. Normal posture. Extremities:  Without clubbing or edema. Neurologic:  Alert and  oriented x4;  grossly normal neurologically. Skin:  Intact without significant lesions or rashes. Cervical Nodes:  No significant cervical adenopathy. Psych:  Alert and cooperative. Normal mood and affect.    

## 2012-12-24 NOTE — Interval H&P Note (Signed)
History and Physical Interval Note:  12/24/2012 7:35 AM  Franne Grip  has presented today for surgery, with the diagnosis of Screening, Elevated LFTs , dysphagia, GERD  The various methods of treatment have been discussed with the patient and family. After consideration of risks, benefits and other options for treatment, the patient has consented to  Procedure(s) (LRB) with comments: COLONOSCOPY WITH PROPOFOL (N/A) - 7:30 ESOPHAGOGASTRODUODENOSCOPY (EGD) WITH PROPOFOL (N/A) as a surgical intervention .  The patient's history has been reviewed, patient examined, no change in status, stable for surgery.  I have reviewed the patient's chart and labs.  Questions were answered to the patient's satisfaction.     Tarez Aijalon Demuro  Dyspepsia and screening colonoscopy. EGD and colonoscopy per plan. Drug screen negative. Review plan with patient and family.The risks, benefits, limitations, imponderables and alternatives regarding both EGD and colonoscopy have been reviewed with the patient. Questions have been answered. All parties agreeable.

## 2012-12-24 NOTE — Anesthesia Postprocedure Evaluation (Signed)
  Anesthesia Post-op Note  Patient: [name] [name]  Procedure(s) Performed: Procedure(s) (LRB) with comments: COLONOSCOPY WITH PROPOFOL (N/A) - started at 0803, in cecum at 0812, withdrawel time Biopsy of anal lesion ESOPHAGOGASTRODUODENOSCOPY (EGD) WITH PROPOFOL (N/A) - done at 0800  Patient Location: PACU  Anesthesia Type:MAC  Level of Consciousness: awake, alert  and patient cooperative  Airway and Oxygen Therapy: Patient Spontanous Breathing  Post-op Pain: none  Post-op Assessment: Post-op Vital signs reviewed, Patient's Cardiovascular Status Stable, Respiratory Function Stable, Patent Airway, No signs of Nausea or vomiting and Pain level controlled  Post-op Vital Signs: Reviewed and stable  Complications: No apparent anesthesia complications

## 2012-12-24 NOTE — Transfer of Care (Signed)
Immediate Anesthesia Transfer of Care Note  Patient: [name] [name]  Procedure(s) Performed: Procedure(s) (LRB) with comments: COLONOSCOPY WITH PROPOFOL (N/A) - started at 0803, in cecum at 0812, withdrawel time Biopsy of anal lesion ESOPHAGOGASTRODUODENOSCOPY (EGD) WITH PROPOFOL (N/A) - done at 0800  Patient Location: PACU  Anesthesia Type:MAC  Level of Consciousness: awake and patient cooperative  Airway & Oxygen Therapy: Patient Spontanous Breathing and Patient connected to face mask oxygen  Post-op Assessment: Report given to PACU RN, Post -op Vital signs reviewed and stable and Patient moving all extremities  Post vital signs: Reviewed and stable  Complications: No apparent anesthesia complications

## 2012-12-25 ENCOUNTER — Encounter (HOSPITAL_COMMUNITY): Payer: Self-pay | Admitting: Internal Medicine

## 2012-12-25 ENCOUNTER — Telehealth: Payer: Self-pay | Admitting: Family Medicine

## 2012-12-25 MED ORDER — TIZANIDINE HCL 4 MG PO CAPS: 4.0000 mg | ORAL_CAPSULE | Freq: Two times a day (BID) | ORAL | Status: DC | PRN

## 2012-12-25 NOTE — Telephone Encounter (Signed)
A new muscle relaxant has been sent in He received 2 prescriptions from the ED that he filled on 12/23 for 20 tabs of percocet and 12/27 for 20 tablets, he can not get any pain medication until his appt on the 16th.

## 2012-12-25 NOTE — Telephone Encounter (Signed)
Called and left message for patient to return call.  

## 2012-12-27 ENCOUNTER — Encounter: Payer: Self-pay | Admitting: Internal Medicine

## 2012-12-28 ENCOUNTER — Encounter: Payer: Self-pay | Admitting: *Deleted

## 2012-12-29 NOTE — Addendum Note (Signed)
Addendum  created 12/29/12 1323 by Despina Hidden, CRNA   Modules edited:Anesthesia Medication Administration

## 2012-12-30 ENCOUNTER — Telehealth: Payer: Self-pay | Admitting: *Deleted

## 2012-12-30 NOTE — Telephone Encounter (Signed)
Patient aware.

## 2012-12-30 NOTE — Telephone Encounter (Signed)
[name] [name] came by the office today. He is still complaining of his stomach being upset and having the sweats. He would like someone to look at  His medications and see if there is anything we can do for him.  Thanks.

## 2012-12-30 NOTE — Telephone Encounter (Signed)
Tried to call pt- NA 

## 2012-12-31 NOTE — Telephone Encounter (Signed)
Tried to call pt- NA, got recording that stated "wireless customer is not available, try calling again later"

## 2013-01-01 ENCOUNTER — Encounter (HOSPITAL_COMMUNITY): Payer: Self-pay | Admitting: *Deleted

## 2013-01-01 ENCOUNTER — Emergency Department (HOSPITAL_COMMUNITY)
Admission: EM | Admit: 2013-01-01 | Discharge: 2013-01-01 | Discharge status: Against Medical Advice | Payer: Medicare Other | Attending: Emergency Medicine | Admitting: Emergency Medicine

## 2013-01-01 ENCOUNTER — Encounter (HOSPITAL_COMMUNITY): Payer: Self-pay | Admitting: Emergency Medicine

## 2013-01-01 ENCOUNTER — Emergency Department (HOSPITAL_COMMUNITY)
Admission: EM | Admit: 2013-01-01 | Discharge: 2013-01-01 | Disposition: A | Discharge status: Home / Self Care | Payer: Medicare Other | Attending: Emergency Medicine | Admitting: Emergency Medicine

## 2013-01-01 DIAGNOSIS — R05 Cough: Secondary | ICD-10-CM | POA: Insufficient documentation

## 2013-01-01 DIAGNOSIS — M542 Cervicalgia: Secondary | ICD-10-CM | POA: Insufficient documentation

## 2013-01-01 DIAGNOSIS — M549 Dorsalgia, unspecified: Secondary | ICD-10-CM | POA: Insufficient documentation

## 2013-01-01 DIAGNOSIS — R059 Cough, unspecified: Secondary | ICD-10-CM | POA: Insufficient documentation

## 2013-01-01 DIAGNOSIS — M109 Gout, unspecified: Secondary | ICD-10-CM | POA: Insufficient documentation

## 2013-01-01 DIAGNOSIS — I10 Essential (primary) hypertension: Secondary | ICD-10-CM | POA: Insufficient documentation

## 2013-01-01 DIAGNOSIS — Z8739 Personal history of other diseases of the musculoskeletal system and connective tissue: Secondary | ICD-10-CM | POA: Insufficient documentation

## 2013-01-01 DIAGNOSIS — K219 Gastro-esophageal reflux disease without esophagitis: Secondary | ICD-10-CM | POA: Insufficient documentation

## 2013-01-01 DIAGNOSIS — F1010 Alcohol abuse, uncomplicated: Secondary | ICD-10-CM | POA: Insufficient documentation

## 2013-01-01 DIAGNOSIS — F17200 Nicotine dependence, unspecified, uncomplicated: Secondary | ICD-10-CM | POA: Insufficient documentation

## 2013-01-01 DIAGNOSIS — J069 Acute upper respiratory infection, unspecified: Secondary | ICD-10-CM

## 2013-01-01 DIAGNOSIS — E785 Hyperlipidemia, unspecified: Secondary | ICD-10-CM | POA: Insufficient documentation

## 2013-01-01 DIAGNOSIS — G8929 Other chronic pain: Secondary | ICD-10-CM | POA: Insufficient documentation

## 2013-01-01 DIAGNOSIS — J029 Acute pharyngitis, unspecified: Secondary | ICD-10-CM | POA: Insufficient documentation

## 2013-01-01 DIAGNOSIS — J3489 Other specified disorders of nose and nasal sinuses: Secondary | ICD-10-CM | POA: Insufficient documentation

## 2013-01-01 DIAGNOSIS — Z8709 Personal history of other diseases of the respiratory system: Secondary | ICD-10-CM | POA: Insufficient documentation

## 2013-01-01 DIAGNOSIS — Z8781 Personal history of (healed) traumatic fracture: Secondary | ICD-10-CM | POA: Insufficient documentation

## 2013-01-01 DIAGNOSIS — Z8719 Personal history of other diseases of the digestive system: Secondary | ICD-10-CM | POA: Insufficient documentation

## 2013-01-01 DIAGNOSIS — Z79899 Other long term (current) drug therapy: Secondary | ICD-10-CM | POA: Insufficient documentation

## 2013-01-01 DIAGNOSIS — F319 Bipolar disorder, unspecified: Secondary | ICD-10-CM | POA: Insufficient documentation

## 2013-01-01 DIAGNOSIS — M79609 Pain in unspecified limb: Secondary | ICD-10-CM | POA: Insufficient documentation

## 2013-01-01 HISTORY — DX: Cervicalgia: M54.2

## 2013-01-01 HISTORY — DX: Dorsalgia, unspecified: M54.9

## 2013-01-01 HISTORY — DX: Other chronic pain: G89.29

## 2013-01-01 HISTORY — DX: Pain in left arm: M79.602

## 2013-01-01 NOTE — ED Notes (Signed)
Pt agitated, went into pt room that he came with & decided to leave without being seen. Pt informed that he could return to the ER & be seen at any time.

## 2013-01-01 NOTE — ED Notes (Signed)
Patient with no complaints at this time. Respirations even and unlabored. Skin warm/dry. Discharge instructions reviewed with patient at this time. Patient given opportunity to voice concerns/ask questions. Patient discharged at this time and left Emergency Department with steady gait.   

## 2013-01-01 NOTE — ED Notes (Signed)
Flu like symptoms since Sunday

## 2013-01-01 NOTE — ED Notes (Addendum)
Pt reporting productive cough, sore throat, and runny nose since Sunday.  Also reporting pain in left arm, back and right leg "for awhile".  Pt reports he saw Dr. Romeo Apple for his arm, back, and leg pain.  Was prescribed naproxen, pt reports he gets no relief from script.

## 2013-01-01 NOTE — ED Provider Notes (Signed)
History     CSN: [id number]  Arrival date & time 01/01/13  4098   First MD Initiated Contact with Patient 01/01/13 [phone]      Chief Complaint  Patient presents with  . Influenza     HPI Pt was seen at 0755.   Per pt, c/o gradual onset and persistence of constant sore throat, runny/stuffy nose, cough, ears and sinus congestion for the past 5 days.  Others in household with same symptoms. Denies fevers, no rash, no CP/SOB, no N/V/D, no abd pain.  Also c/o gradual onset and persistence of constant acute flair of his chronic low back and left forearm "pain" for the past several weeks.  Began after he ran out of his narcotic pain meds.  Denies any change in his usual chronic pain pattern.  Pain worsens with palpation of the area and body position changes. Denies incont/retention of bowel or bladder, no saddle anesthesia, no focal motor weakness, no tingling/numbness in extremities, no fevers, no injury, no abd pain. The patient has a significant history of similar symptoms previously, recently being evaluated for this complaint and multiple prior evals for same.      Past Medical History  Diagnosis Date  . Bronchitis   . Acid reflux   . Pancreatitis     March 2013  . Gout   . Bipolar disorder   . Hyperlipidemia   . Alcohol abuse     6-8 cans of beer daily  . Pseudocyst of pancreas 02/28/2012  . Arthritis   . Fracture of lower leg   . Hypertension   . Chronic pain   . Chronic neck pain   . Chronic back pain   . Left arm pain     chronic    Past Surgical History  Procedure Date  . Nose surgery     broken nose  . Circumcision 03/17/2012    Procedure: CIRCUMCISION ADULT;  Surgeon: Ky Barban, MD;  Location: [facility];  Service: Urology;  Laterality: N/A;  . Colonoscopy with propofol 12/24/2012    Procedure: COLONOSCOPY WITH PROPOFOL;  Surgeon: Corbin Ade, MD;  Location: [facility];  Service: Endoscopy;  Laterality: N/A;  started at 0803, in cecum at 0812, withdrawel  time Biopsy of anal lesion  . Esophagogastroduodenoscopy (egd) with propofol 12/24/2012    Procedure: ESOPHAGOGASTRODUODENOSCOPY (EGD) WITH PROPOFOL;  Surgeon: Corbin Ade, MD;  Location: [facility];  Service: Endoscopy;  Laterality: N/A;  done at 0800    Family History  Problem Relation Age of Onset  . Diabetes Father   . Anesthesia problems Neg Hx   . Hypotension Neg Hx   . Malignant hyperthermia Neg Hx   . Pseudochol deficiency Neg Hx   . Colon cancer Neg Hx     History  Substance Use Topics  . Smoking status: Current Every Day Smoker -- 0.5 packs/day for 25 years    Types: Cigarettes  . Smokeless tobacco: Not on file  . Alcohol Use: 0.0 oz/week     Comment: 6-8 beers a day    Review of Systems ROS: Statement: All systems negative except as marked or noted in the HPI; Constitutional: Negative for fever and chills. ; ; Eyes: Negative for eye pain, redness and discharge. ; ; ENMT: Negative for ear pain, hoarseness, +nasal congestion, sinus pressure and sore throat. ; ; Cardiovascular: Negative for chest pain, palpitations, diaphoresis, dyspnea and peripheral edema. ; ; Respiratory: +cough. Negative for wheezing and stridor. ; ; Gastrointestinal: Negative for  nausea, vomiting, diarrhea, abdominal pain, blood in stool, hematemesis, jaundice and rectal bleeding. . ; ; Genitourinary: Negative for dysuria, flank pain and hematuria. ; ; Musculoskeletal: +LBP, left forearm pain. Negative for neck pain. Negative for swelling and trauma.; ; Skin: Negative for pruritus, rash, abrasions, blisters, bruising and skin lesion.; ; Neuro: Negative for headache, lightheadedness and neck stiffness. Negative for weakness, altered level of consciousness , altered mental status, extremity weakness, paresthesias, involuntary movement, seizure and syncope.       Allergies  Penicillins  Home Medications   Current Outpatient Rx  Name  Route  Sig  Dispense  Refill  . ALBUTEROL SULFATE HFA 108 (90 BASE)  MCG/ACT IN AERS   Inhalation   Inhale 2 puffs into the lungs every 4 (four) hours as needed for wheezing.   1 Inhaler   1   . AMLODIPINE BESYLATE 5 MG PO TABS   Oral   Take 1 tablet (5 mg total) by mouth daily.   30 tablet   4   . CITALOPRAM HYDROBROMIDE 20 MG PO TABS   Oral   Take 1 tablet (20 mg total) by mouth daily.   30 tablet   2   . CYCLOBENZAPRINE HCL 10 MG PO TABS   Oral   Take 1 tablet (10 mg total) by mouth 3 (three) times daily as needed. For muscle spasms   30 tablet   3   . FEXOFENADINE HCL 180 MG PO TABS   Oral   Take 1 tablet (180 mg total) by mouth daily.   30 tablet   6   . OMEGA-3 FATTY ACIDS 1000 MG PO CAPS   Oral   Take 1 g by mouth daily.          Marland Kitchen GEMFIBROZIL 600 MG PO TABS   Oral   Take 600 mg by mouth 2 (two) times daily before a meal.         . IBUPROFEN 200 MG PO TABS   Oral   Take 600 mg by mouth every 6 (six) hours as needed. Pain         . MECLIZINE HCL 25 MG PO TABS   Oral   Take 1 tablet (25 mg total) by mouth 3 (three) times daily as needed for dizziness.   21 tablet   0   . MELOXICAM 7.5 MG PO TABS   Oral   Take 7.5 mg by mouth 2 (two) times daily.          . ADULT MULTIVITAMIN W/MINERALS CH   Oral   Take 1 tablet by mouth daily.         Marland Kitchen NAPROXEN 500 MG PO TABS   Oral   Take 1 tablet (500 mg total) by mouth 2 (two) times daily with a meal.   56 tablet   0   . ONDANSETRON HCL 4 MG PO TABS   Oral   Take 1 tablet (4 mg total) by mouth every 6 (six) hours as needed for nausea.   20 tablet   0   . PANTOPRAZOLE SODIUM 40 MG PO TBEC   Oral   Take 1 tablet (40 mg total) by mouth 2 (two) times daily.   60 tablet   3   . TAMSULOSIN HCL 0.4 MG PO CAPS   Oral   Take 1 capsule (0.4 mg total) by mouth daily.   30 capsule   6   . TIZANIDINE HCL 4 MG PO CAPS   Oral  Take 1 capsule (4 mg total) by mouth 2 (two) times daily as needed for muscle spasms.   60 capsule   2   . TRAZODONE HCL 100 MG PO  TABS   Oral   Take 100 mg by mouth at bedtime as needed. Sleep           BP 156/92  Pulse 43  Temp 98.7 F (37.1 C) (Oral)  Resp 20  Ht 5' 9.5" (1.765 m)  Wt 184 lb (83.462 kg)  BMI 26.78 kg/m2  SpO2 99%  Physical Exam 0800: Physical examination:  Nursing notes reviewed; Vital signs and O2 SAT reviewed;  Constitutional: Well developed, Well nourished, Well hydrated, In no acute distress; Head:  Normocephalic, atraumatic; Eyes: EOMI, PERRL, No scleral icterus; ENMT: TM's clear bilat. +edemetous nasal turbinates bilat with clear rhinorrhea.  Mouth and pharynx without lesions. No tonsillar exudates. No intra-oral edema. No hoarse voice, no drooling, no stridor. No pain with manipulation of larynx. Mouth and pharynx normal, Mucous membranes moist; Neck: Supple, Full range of motion, No lymphadenopathy; Cardiovascular: Regular rate and rhythm, No murmur, rub, or gallop; Respiratory: Breath sounds clear & equal bilaterally, No rales, rhonchi, wheezes.  Speaking full sentences with ease, Normal respiratory effort/excursion; Chest: Nontender, Movement normal; Abdomen: Soft, Nontender, Nondistended, Normal bowel sounds;; Spine:  No midline CS, TS, LS tenderness.;; Extremities: Pulses normal, No tenderness, No deformity. No edema, No calf edema or asymmetry.; Neuro: AA&Ox3, Major CN grossly intact.  Speech clear. Gait steady. Climbs on and off stretcher easily by himself. No gross focal motor or sensory deficits in extremities.; Skin: Color normal, Warm, Dry.   ED Course  Procedures    MDM  MDM Reviewed: previous chart, nursing note and vitals     0805:  Appears to be URI at this time, will tx symptomatically. Long hx of chronic pain with multiple ED visits for same.  Pt endorses acute flair of his usual long standing chronic left arm and back pain today, no change from his usual chronic pain pattern.  EPIC chart reviewed:  Pt called Dr. Deirdre Peer of[facility] on 12/25/12 requesting pain meds and was told  no narcotic rx until he is seen in her ofc the 16th.  This relayed to pt.  Pt encouraged to f/u with his PMD and Pain Management doctor for good continuity of care and control of his chronic pain.  Verb understanding.         Laray Anger, DO 01/01/13 Margretta Ditty

## 2013-01-01 NOTE — ED Notes (Signed)
Vomiting in AM since Sunday.  Last time vomited was yesterday.  Body aches, runny nose.  Does not know how high temp got, but stated "I started sweating."

## 2013-01-02 ENCOUNTER — Encounter (HOSPITAL_COMMUNITY): Payer: Self-pay | Admitting: *Deleted

## 2013-01-02 ENCOUNTER — Emergency Department (HOSPITAL_COMMUNITY)
Admission: EM | Admit: 2013-01-02 | Discharge: 2013-01-02 | Disposition: A | Discharge status: Home / Self Care | Payer: Medicare Other | Attending: Emergency Medicine | Admitting: Emergency Medicine

## 2013-01-02 DIAGNOSIS — K219 Gastro-esophageal reflux disease without esophagitis: Secondary | ICD-10-CM | POA: Insufficient documentation

## 2013-01-02 DIAGNOSIS — F319 Bipolar disorder, unspecified: Secondary | ICD-10-CM | POA: Insufficient documentation

## 2013-01-02 DIAGNOSIS — F17200 Nicotine dependence, unspecified, uncomplicated: Secondary | ICD-10-CM | POA: Insufficient documentation

## 2013-01-02 DIAGNOSIS — G43909 Migraine, unspecified, not intractable, without status migrainosus: Secondary | ICD-10-CM | POA: Insufficient documentation

## 2013-01-02 DIAGNOSIS — M159 Polyosteoarthritis, unspecified: Secondary | ICD-10-CM | POA: Insufficient documentation

## 2013-01-02 DIAGNOSIS — Z8709 Personal history of other diseases of the respiratory system: Secondary | ICD-10-CM | POA: Insufficient documentation

## 2013-01-02 DIAGNOSIS — I10 Essential (primary) hypertension: Secondary | ICD-10-CM | POA: Insufficient documentation

## 2013-01-02 DIAGNOSIS — Z79899 Other long term (current) drug therapy: Secondary | ICD-10-CM | POA: Insufficient documentation

## 2013-01-02 DIAGNOSIS — R51 Headache: Secondary | ICD-10-CM

## 2013-01-02 DIAGNOSIS — Z8719 Personal history of other diseases of the digestive system: Secondary | ICD-10-CM | POA: Insufficient documentation

## 2013-01-02 DIAGNOSIS — G8929 Other chronic pain: Secondary | ICD-10-CM | POA: Insufficient documentation

## 2013-01-02 DIAGNOSIS — E785 Hyperlipidemia, unspecified: Secondary | ICD-10-CM | POA: Insufficient documentation

## 2013-01-02 MED ORDER — KETOROLAC TROMETHAMINE 30 MG/ML IJ SOLN
30.0000 mg | Freq: Once | INTRAMUSCULAR | Status: AC
  Administered 2013-01-02: 30 mg via INTRAVENOUS
  Filled 2013-01-02: qty 1

## 2013-01-02 MED ORDER — OXYCODONE-ACETAMINOPHEN 5-325 MG PO TABS
2.0000 | ORAL_TABLET | Freq: Once | ORAL | Status: AC
  Administered 2013-01-02: 2 via ORAL
  Filled 2013-01-02: qty 2

## 2013-01-02 MED ORDER — METOCLOPRAMIDE HCL 5 MG/ML IJ SOLN
10.0000 mg | Freq: Once | INTRAMUSCULAR | Status: AC
  Administered 2013-01-02: 10 mg via INTRAVENOUS
  Filled 2013-01-02: qty 2

## 2013-01-02 MED ORDER — SODIUM CHLORIDE 0.9 % IV BOLUS (SEPSIS)
1000.0000 mL | Freq: Once | INTRAVENOUS | Status: AC
  Administered 2013-01-02: 1000 mL via INTRAVENOUS

## 2013-01-02 MED ORDER — MECLIZINE HCL 12.5 MG PO TABS
25.0000 mg | ORAL_TABLET | Freq: Once | ORAL | Status: AC
  Administered 2013-01-02: 25 mg via ORAL
  Filled 2013-01-02: qty 2

## 2013-01-02 MED ORDER — ONDANSETRON 8 MG PO TBDP
8.0000 mg | ORAL_TABLET | Freq: Once | ORAL | Status: AC
  Administered 2013-01-02: 8 mg via ORAL
  Filled 2013-01-02: qty 1

## 2013-01-02 MED ORDER — HYDROMORPHONE HCL PF 1 MG/ML IJ SOLN
0.5000 mg | Freq: Once | INTRAMUSCULAR | Status: AC
  Administered 2013-01-02: 0.5 mg via INTRAVENOUS
  Filled 2013-01-02: qty 1

## 2013-01-02 MED ORDER — DIPHENHYDRAMINE HCL 50 MG/ML IJ SOLN
12.5000 mg | Freq: Once | INTRAMUSCULAR | Status: AC
  Administered 2013-01-02: 12.5 mg via INTRAVENOUS
  Filled 2013-01-02: qty 1

## 2013-01-02 NOTE — ED Notes (Signed)
Patient given a Ginger Ale per RN.

## 2013-01-02 NOTE — ED Notes (Signed)
Patient states that he is still feeling really light headed and dizzy. Patient would like to speak with the MD and RN. RN made aware.

## 2013-01-02 NOTE — ED Notes (Signed)
Pt returns to ED secondary to "severe headache and feeling like body is tingling.

## 2013-01-02 NOTE — ED Provider Notes (Signed)
History    Scribed for Donnetta Hutching, MD, the patient was seen in room [facility]. This chart was scribed by Katha Cabal.   CSN: [id number]  Arrival date & time 01/02/13  1107   First MD Initiated Contact with Patient 01/02/13 1134      Chief Complaint  Patient presents with  . Migraine    (Consider location/radiation/quality/duration/timing/severity/associated sxs/prior treatment) HPI Donnetta Hutching, MD entered patient's room at 11:51 AM   [name] [name] is a 52 y.o. male who presents to the Emergency Department complaining of severe bifrontal headache since this morning.  Pain is constant and persists in ED.  Reports relief in previous episodes with Dilaudid. Patient states that he is stressed.       PCP Milinda Antis, MD     Past Medical History  Diagnosis Date  . Bronchitis   . Acid reflux   . Pancreatitis     March 2013  . Gout   . Bipolar disorder   . Hyperlipidemia   . Alcohol abuse     6-8 cans of beer daily  . Pseudocyst of pancreas 02/28/2012  . Arthritis   . Fracture of lower leg   . Hypertension   . Chronic pain   . Chronic neck pain   . Chronic back pain   . Left arm pain     chronic    Past Surgical History  Procedure Date  . Nose surgery     broken nose  . Circumcision 03/17/2012    Procedure: CIRCUMCISION ADULT;  Surgeon: Ky Barban, MD;  Location: [facility];  Service: Urology;  Laterality: N/A;  . Colonoscopy with propofol 12/24/2012    Procedure: COLONOSCOPY WITH PROPOFOL;  Surgeon: Corbin Ade, MD;  Location: [facility];  Service: Endoscopy;  Laterality: N/A;  started at 0803, in cecum at 0812, withdrawel time Biopsy of anal lesion  . Esophagogastroduodenoscopy (egd) with propofol 12/24/2012    Procedure: ESOPHAGOGASTRODUODENOSCOPY (EGD) WITH PROPOFOL;  Surgeon: Corbin Ade, MD;  Location: [facility];  Service: Endoscopy;  Laterality: N/A;  done at 0800    Family History  Problem Relation Age of Onset  . Diabetes Father   .  Anesthesia problems Neg Hx   . Hypotension Neg Hx   . Malignant hyperthermia Neg Hx   . Pseudochol deficiency Neg Hx   . Colon cancer Neg Hx     History  Substance Use Topics  . Smoking status: Current Every Day Smoker -- 0.5 packs/day for 25 years    Types: Cigarettes  . Smokeless tobacco: Not on file  . Alcohol Use: 0.0 oz/week     Comment: 6-8 beers a day      Review of Systems  All other systems reviewed and are negative.     Allergies  Penicillins  Home Medications   Current Outpatient Rx  Name  Route  Sig  Dispense  Refill  . ALBUTEROL SULFATE HFA 108 (90 BASE) MCG/ACT IN AERS   Inhalation   Inhale 2 puffs into the lungs every 4 (four) hours as needed for wheezing.   1 Inhaler   1   . AMLODIPINE BESYLATE 5 MG PO TABS   Oral   Take 1 tablet (5 mg total) by mouth daily.   30 tablet   4   . CITALOPRAM HYDROBROMIDE 20 MG PO TABS   Oral   Take 1 tablet (20 mg total) by mouth daily.   30 tablet   2   . CYCLOBENZAPRINE HCL  10 MG PO TABS   Oral   Take 1 tablet (10 mg total) by mouth 3 (three) times daily as needed. For muscle spasms   30 tablet   3   . FEXOFENADINE HCL 180 MG PO TABS   Oral   Take 1 tablet (180 mg total) by mouth daily.   30 tablet   6   . OMEGA-3 FATTY ACIDS 1000 MG PO CAPS   Oral   Take 1 g by mouth daily.          Marland Kitchen GEMFIBROZIL 600 MG PO TABS   Oral   Take 600 mg by mouth 2 (two) times daily before a meal.         . IBUPROFEN 200 MG PO TABS   Oral   Take 600 mg by mouth every 6 (six) hours as needed. Pain         . MECLIZINE HCL 25 MG PO TABS   Oral   Take 1 tablet (25 mg total) by mouth 3 (three) times daily as needed for dizziness.   21 tablet   0   . MELOXICAM 7.5 MG PO TABS   Oral   Take 7.5 mg by mouth 2 (two) times daily.          . ADULT MULTIVITAMIN W/MINERALS CH   Oral   Take 1 tablet by mouth daily.         Marland Kitchen NAPROXEN 500 MG PO TABS   Oral   Take 1 tablet (500 mg total) by mouth 2 (two)  times daily with a meal.   56 tablet   0   . ONDANSETRON HCL 4 MG PO TABS   Oral   Take 1 tablet (4 mg total) by mouth every 6 (six) hours as needed for nausea.   20 tablet   0   . PANTOPRAZOLE SODIUM 40 MG PO TBEC   Oral   Take 1 tablet (40 mg total) by mouth 2 (two) times daily.   60 tablet   3   . TAMSULOSIN HCL 0.4 MG PO CAPS   Oral   Take 1 capsule (0.4 mg total) by mouth daily.   30 capsule   6   . TIZANIDINE HCL 4 MG PO CAPS   Oral   Take 1 capsule (4 mg total) by mouth 2 (two) times daily as needed for muscle spasms.   60 capsule   2   . TRAZODONE HCL 100 MG PO TABS   Oral   Take 100 mg by mouth at bedtime as needed. Sleep           BP 144/100  Pulse 78  Temp 98 F (36.7 C) (Oral)  Resp 22  Ht 5\' 9"  (1.753 m)  Wt 183 lb (83.008 kg)  BMI 27.02 kg/m2  SpO2 100%  Physical Exam  Nursing note and vitals reviewed. Constitutional: He is oriented to person, place, and time. He appears well-developed and well-nourished.  HENT:  Head: Normocephalic and atraumatic.  Eyes: Conjunctivae normal and EOM are normal. Pupils are equal, round, and reactive to light.  Neck: Normal range of motion. Neck supple.  Cardiovascular: Normal rate, regular rhythm and normal heart sounds.   Pulmonary/Chest: Effort normal and breath sounds normal.  Abdominal: Soft. Bowel sounds are normal.  Musculoskeletal: Normal range of motion.  Neurological: He is alert and oriented to person, place, and time.  Skin: Skin is warm and dry.  Psychiatric: He has a normal mood and affect.  ED Course  Procedures (including critical care time)    DIAGNOSTIC STUDIES: Oxygen Saturation is 100% on room air normal by my interpretation.     COORDINATION OF CARE: 11:55 AM  Physical exam complete. Discussed pain management contract with Dr. Jeanice Lim with patient and sister.      LABS / RADIOLOGY:   Labs Reviewed - No data to display No results found.      IMPRESSION: No diagnosis  found.   NEW MEDICATIONS: New Prescriptions   No medications on file           MDM  No neuro deficits.  Feeling better p ivf's, meds       I personally performed the services described in this documentation, which was scribed in my presence. The recorded information has been reviewed and is accurate.    Donnetta Hutching, MD 01/02/13 [phone]

## 2013-01-06 NOTE — Telephone Encounter (Signed)
Tried to call pt- NA 

## 2013-01-07 ENCOUNTER — Encounter: Payer: Self-pay | Admitting: Family Medicine

## 2013-01-07 ENCOUNTER — Ambulatory Visit (INDEPENDENT_AMBULATORY_CARE_PROVIDER_SITE_OTHER): Payer: Medicare Other | Admitting: Family Medicine

## 2013-01-07 VITALS — BP 130/76 | HR 88 | Resp 18 | Ht 70.0 in | Wt 178.0 lb

## 2013-01-07 DIAGNOSIS — G8929 Other chronic pain: Secondary | ICD-10-CM

## 2013-01-07 DIAGNOSIS — J40 Bronchitis, not specified as acute or chronic: Secondary | ICD-10-CM

## 2013-01-07 DIAGNOSIS — M549 Dorsalgia, unspecified: Secondary | ICD-10-CM

## 2013-01-07 DIAGNOSIS — M7710 Lateral epicondylitis, unspecified elbow: Secondary | ICD-10-CM

## 2013-01-07 DIAGNOSIS — F1010 Alcohol abuse, uncomplicated: Secondary | ICD-10-CM

## 2013-01-07 DIAGNOSIS — I10 Essential (primary) hypertension: Secondary | ICD-10-CM

## 2013-01-07 MED ORDER — OXYCODONE-ACETAMINOPHEN 7.5-325 MG PO TABS: 1.0000 | ORAL_TABLET | Freq: Four times a day (QID) | ORAL | Status: AC | PRN

## 2013-01-07 MED ORDER — TIZANIDINE HCL 4 MG PO CAPS: 4.0000 mg | ORAL_CAPSULE | Freq: Two times a day (BID) | ORAL | Status: DC | PRN

## 2013-01-07 MED ORDER — GEMFIBROZIL 600 MG PO TABS: 600.0000 mg | ORAL_TABLET | Freq: Two times a day (BID) | ORAL | Status: DC

## 2013-01-07 NOTE — Patient Instructions (Addendum)
Continue your current medication Only take the robitussin Call for the detox programs Call the hotline if you have any problems before you go to ER  F/U 3 months

## 2013-01-07 NOTE — Progress Notes (Signed)
  Subjective:    Patient ID: [name] [name], male    DOB: Jul 15, 1961, 52 y.o.   MRN: [id number]  HPI   Patient here to followup ER visit he he was seen on January 11 with upper respiratory infection he continues to have mild cough. He states that he is ready to enter detox and plans to actually go to the meds should call Monday to be incarcerated for 24 hours and then go to detox program. He he's been drinking more than usual and is also been drinking moonshine on top of his typical 40 ounces of beer. denies any illicit drug use the  We had a  discussion about his multiple ER visits for headache back pain which are all chronic he was seen for tennis elbow and was followed up by orthopedics who suggested a brace and and anti-inflammatories which he states he's not been doing.   Review of Systems  GEN- denies fatigue, fever, weight loss,weakness, recent illness HEENT- denies eye drainage, change in vision, nasal discharge, CVS- denies chest pain, palpitations RESP- denies SOB,+ cough, wheeze ABD- denies N/V, change in stools, abd pain GU- denies dysuria, hematuria, dribbling, incontinence MSK-+ joint pain, muscle aches, injury Neuro- denies headache, dizziness, syncope, seizure activity      Objective:   Physical Exam  GEN- NAD, alert and oriented x3,smells of alcohol HEENT- PERRL, EOMI, non injected sclera, pink conjunctiva, MMM, oropharynx clear Neck- Supple, shotty LAD CVS- RRR, no murmur RESP-CTAB ABD-NABS,soft,NT,ND EXT- No edema Pulses- Radial, DP- 2+       Assessment & Plan:

## 2013-01-07 NOTE — Telephone Encounter (Signed)
Have been unable to reach pt. Mailed letter.

## 2013-01-10 ENCOUNTER — Encounter: Payer: Self-pay | Admitting: Family Medicine

## 2013-01-10 NOTE — Assessment & Plan Note (Signed)
Discussed misuse of ER, he is on pain contract and understands ER will not give him narcotic medications

## 2013-01-10 NOTE — Assessment & Plan Note (Signed)
Viral illness, reassuring exam, robitussin for cough

## 2013-01-10 NOTE — Assessment & Plan Note (Signed)
Reviewed ortho note, NSAIDS given, normal course of healing expected

## 2013-01-10 NOTE — Assessment & Plan Note (Signed)
BP looks okay today, no change to meds 

## 2013-01-10 NOTE — Assessment & Plan Note (Signed)
He is ready to go to [facility], given numbers again to help check himself in, I am weary that he will not follow through, he has an interesting back up plan regarding going to the magistrate to be detained for 24 hours

## 2013-01-11 ENCOUNTER — Encounter (HOSPITAL_COMMUNITY): Payer: Self-pay | Admitting: *Deleted

## 2013-01-11 ENCOUNTER — Emergency Department (HOSPITAL_COMMUNITY)
Admission: EM | Admit: 2013-01-11 | Discharge: 2013-01-12 | Disposition: A | Discharge status: Other Acute Inpatient Hospital | Payer: Medicare Other | Attending: Emergency Medicine | Admitting: Emergency Medicine

## 2013-01-11 DIAGNOSIS — F1910 Other psychoactive substance abuse, uncomplicated: Secondary | ICD-10-CM | POA: Insufficient documentation

## 2013-01-11 DIAGNOSIS — Z8719 Personal history of other diseases of the digestive system: Secondary | ICD-10-CM | POA: Insufficient documentation

## 2013-01-11 DIAGNOSIS — Z8639 Personal history of other endocrine, nutritional and metabolic disease: Secondary | ICD-10-CM | POA: Insufficient documentation

## 2013-01-11 DIAGNOSIS — K219 Gastro-esophageal reflux disease without esophagitis: Secondary | ICD-10-CM | POA: Insufficient documentation

## 2013-01-11 DIAGNOSIS — F17200 Nicotine dependence, unspecified, uncomplicated: Secondary | ICD-10-CM | POA: Insufficient documentation

## 2013-01-11 DIAGNOSIS — Z8709 Personal history of other diseases of the respiratory system: Secondary | ICD-10-CM | POA: Insufficient documentation

## 2013-01-11 DIAGNOSIS — F1010 Alcohol abuse, uncomplicated: Secondary | ICD-10-CM | POA: Insufficient documentation

## 2013-01-11 DIAGNOSIS — Z79899 Other long term (current) drug therapy: Secondary | ICD-10-CM | POA: Insufficient documentation

## 2013-01-11 DIAGNOSIS — G8929 Other chronic pain: Secondary | ICD-10-CM | POA: Insufficient documentation

## 2013-01-11 DIAGNOSIS — Z8739 Personal history of other diseases of the musculoskeletal system and connective tissue: Secondary | ICD-10-CM | POA: Insufficient documentation

## 2013-01-11 DIAGNOSIS — F319 Bipolar disorder, unspecified: Secondary | ICD-10-CM | POA: Insufficient documentation

## 2013-01-11 DIAGNOSIS — M7710 Lateral epicondylitis, unspecified elbow: Secondary | ICD-10-CM | POA: Insufficient documentation

## 2013-01-11 DIAGNOSIS — E785 Hyperlipidemia, unspecified: Secondary | ICD-10-CM | POA: Insufficient documentation

## 2013-01-11 DIAGNOSIS — Z862 Personal history of diseases of the blood and blood-forming organs and certain disorders involving the immune mechanism: Secondary | ICD-10-CM | POA: Insufficient documentation

## 2013-01-11 LAB — COMPREHENSIVE METABOLIC PANEL
ALT: 25 U/L (ref 0–53)
AST: 65 U/L — ABNORMAL HIGH (ref 0–37)
Albumin: 3.9 g/dL (ref 3.5–5.2)
Alkaline Phosphatase: 55 U/L (ref 39–117)
BUN: 17 mg/dL (ref 6–23)
CO2: 29 mEq/L (ref 19–32)
Calcium: 9.3 mg/dL (ref 8.4–10.5)
Chloride: 98 mEq/L (ref 96–112)
Creatinine, Ser: 0.84 mg/dL (ref 0.50–1.35)
GFR calc Af Amer: >90 mL/min (ref >90)
GFR calc non Af Amer: >90 mL/min (ref >90)
Glucose, Bld: 89 mg/dL (ref 70–99)
Potassium: 3.9 mEq/L (ref 3.5–5.1)
Sodium: 138 mEq/L (ref 135–145)
Total Bilirubin: 0.6 mg/dL (ref 0.3–1.2)
Total Protein: 7.5 g/dL (ref 6.0–8.3)

## 2013-01-11 LAB — CBC
HCT: 44.1 % (ref 39.0–52.0)
Hemoglobin: 15.2 g/dL (ref 13.0–17.0)
MCH: 33.6 pg (ref 26.0–34.0)
MCHC: 34.5 g/dL (ref 30.0–36.0)
MCV: 97.4 fL (ref 78.0–100.0)
Platelets: 332 10*3/uL (ref 150–400)
RBC: 4.53 MIL/uL (ref 4.22–5.81)
RDW: 14.5 % (ref 11.5–15.5)
WBC: 5.1 10*3/uL (ref 4.0–10.5)

## 2013-01-11 LAB — RAPID URINE DRUG SCREEN, HOSP PERFORMED
Amphetamines: NOT DETECTED
Barbiturates: NOT DETECTED
Benzodiazepines: NOT DETECTED
Cocaine: POSITIVE — AB
Opiates: NOT DETECTED
Tetrahydrocannabinol: POSITIVE — AB

## 2013-01-11 LAB — ACETAMINOPHEN LEVEL: Acetaminophen (Tylenol), Serum: <15 ug/mL (ref 10–30)

## 2013-01-11 LAB — URINALYSIS, ROUTINE W REFLEX MICROSCOPIC
Bilirubin Urine: NEGATIVE
Glucose, UA: NEGATIVE mg/dL
Hgb urine dipstick: NEGATIVE
Leukocytes, UA: NEGATIVE
Nitrite: NEGATIVE
Protein, ur: NEGATIVE mg/dL
Specific Gravity, Urine: 1.02 (ref 1.005–1.030)
Urobilinogen, UA: 0.2 mg/dL (ref 0.0–1.0)
pH: 6.5 (ref 5.0–8.0)

## 2013-01-11 LAB — ETHANOL: Alcohol, Ethyl (B): 203 mg/dL — ABNORMAL HIGH (ref 0–11)

## 2013-01-11 LAB — SALICYLATE LEVEL: Salicylate Lvl: <2 mg/dL — ABNORMAL LOW (ref 2.8–20.0)

## 2013-01-11 MED ORDER — TRAZODONE HCL 50 MG PO TABS
100.0000 mg | ORAL_TABLET | Freq: Every evening | ORAL | Status: DC | PRN
  Filled 2013-01-11 (×2): qty 2

## 2013-01-11 MED ORDER — CITALOPRAM HYDROBROMIDE 20 MG PO TABS
20.0000 mg | ORAL_TABLET | Freq: Every day | ORAL | Status: DC
  Administered 2013-01-11: 20 mg via ORAL
  Filled 2013-01-11 (×4): qty 1

## 2013-01-11 MED ORDER — LORATADINE 10 MG PO TABS
ORAL_TABLET | ORAL | Status: AC
  Filled 2013-01-11: qty 1

## 2013-01-11 MED ORDER — CITALOPRAM HYDROBROMIDE 20 MG PO TABS
ORAL_TABLET | ORAL | Status: AC
  Filled 2013-01-11: qty 1

## 2013-01-11 MED ORDER — PANTOPRAZOLE SODIUM 40 MG PO TBEC
40.0000 mg | DELAYED_RELEASE_TABLET | Freq: Two times a day (BID) | ORAL | Status: DC
  Administered 2013-01-11: 40 mg via ORAL
  Filled 2013-01-11: qty 1

## 2013-01-11 MED ORDER — TAMSULOSIN HCL 0.4 MG PO CAPS
ORAL_CAPSULE | ORAL | Status: AC
  Filled 2013-01-11: qty 1

## 2013-01-11 MED ORDER — GEMFIBROZIL 600 MG PO TABS
600.0000 mg | ORAL_TABLET | Freq: Two times a day (BID) | ORAL | Status: DC
  Filled 2013-01-11 (×5): qty 1

## 2013-01-11 MED ORDER — NAPROXEN 250 MG PO TABS
500.0000 mg | ORAL_TABLET | Freq: Two times a day (BID) | ORAL | Status: DC
  Administered 2013-01-12: 500 mg via ORAL
  Filled 2013-01-11: qty 2

## 2013-01-11 MED ORDER — AMLODIPINE BESYLATE 5 MG PO TABS
ORAL_TABLET | ORAL | Status: AC
  Filled 2013-01-11: qty 1

## 2013-01-11 MED ORDER — ONDANSETRON HCL 4 MG PO TABS: 4.0000 mg | ORAL_TABLET | Freq: Three times a day (TID) | ORAL | Status: DC | PRN

## 2013-01-11 MED ORDER — IBUPROFEN 400 MG PO TABS
600.0000 mg | ORAL_TABLET | Freq: Three times a day (TID) | ORAL | Status: DC | PRN
  Administered 2013-01-11 (×2): 600 mg via ORAL
  Filled 2013-01-11 (×2): qty 2

## 2013-01-11 MED ORDER — MELOXICAM 7.5 MG PO TABS
7.5000 mg | ORAL_TABLET | Freq: Every day | ORAL | Status: DC
  Filled 2013-01-11 (×3): qty 1

## 2013-01-11 MED ORDER — NICOTINE 21 MG/24HR TD PT24: 21.0000 mg | MEDICATED_PATCH | Freq: Every day | TRANSDERMAL | Status: DC

## 2013-01-11 MED ORDER — ONDANSETRON HCL 4 MG PO TABS: 4.0000 mg | ORAL_TABLET | Freq: Four times a day (QID) | ORAL | Status: DC | PRN

## 2013-01-11 MED ORDER — LORATADINE 10 MG PO TABS
10.0000 mg | ORAL_TABLET | Freq: Every day | ORAL | Status: DC
  Administered 2013-01-11: 10 mg via ORAL

## 2013-01-11 MED ORDER — TIZANIDINE HCL 4 MG PO CAPS
4.0000 mg | ORAL_CAPSULE | Freq: Two times a day (BID) | ORAL | Status: DC | PRN
  Filled 2013-01-11: qty 1

## 2013-01-11 MED ORDER — ALUM & MAG HYDROXIDE-SIMETH 200-200-20 MG/5ML PO SUSP: 30.0000 mL | ORAL | Status: DC | PRN

## 2013-01-11 MED ORDER — AMLODIPINE BESYLATE 5 MG PO TABS
5.0000 mg | ORAL_TABLET | Freq: Every day | ORAL | Status: DC
  Administered 2013-01-11: 5 mg via ORAL

## 2013-01-11 MED ORDER — ADULT MULTIVITAMIN W/MINERALS CH
ORAL_TABLET | ORAL | Status: AC
  Filled 2013-01-11: qty 1

## 2013-01-11 MED ORDER — TAMSULOSIN HCL 0.4 MG PO CAPS
0.4000 mg | ORAL_CAPSULE | Freq: Every day | ORAL | Status: DC
  Administered 2013-01-11: 0.4 mg via ORAL
  Filled 2013-01-11 (×4): qty 1

## 2013-01-11 MED ORDER — NICOTINE 21 MG/24HR TD PT24
MEDICATED_PATCH | TRANSDERMAL | Status: AC
  Administered 2013-01-11: 21 mg
  Filled 2013-01-11: qty 1

## 2013-01-11 MED ORDER — TRAZODONE HCL 50 MG PO TABS
ORAL_TABLET | ORAL | Status: AC
  Filled 2013-01-11: qty 2

## 2013-01-11 MED ORDER — LORAZEPAM 1 MG PO TABS
1.0000 mg | ORAL_TABLET | Freq: Three times a day (TID) | ORAL | Status: DC | PRN
  Administered 2013-01-11: 1 mg via ORAL
  Filled 2013-01-11: qty 1

## 2013-01-11 MED ORDER — ALBUTEROL SULFATE HFA 108 (90 BASE) MCG/ACT IN AERS: 2.0000 | INHALATION_SPRAY | RESPIRATORY_TRACT | Status: DC | PRN

## 2013-01-11 MED ORDER — MELOXICAM 7.5 MG PO TABS
ORAL_TABLET | ORAL | Status: AC
  Filled 2013-01-11: qty 1

## 2013-01-11 MED ORDER — ADULT MULTIVITAMIN W/MINERALS CH
1.0000 | ORAL_TABLET | Freq: Every day | ORAL | Status: DC
  Administered 2013-01-11: 1 via ORAL
  Filled 2013-01-11: qty 1

## 2013-01-11 NOTE — ED Notes (Signed)
Pt assisted to bathroom

## 2013-01-11 NOTE — ED Notes (Signed)
Patient states that he has a problem with drugs and alcohol and wants detox.  Consumes approx. 1 and 1/2 pints of liquor and 10-12 beers daily.  States he is using crack-cocaine once a week and using marijuana occasionally.  States that he is ready to totally stop using these drugs.

## 2013-01-11 NOTE — ED Notes (Signed)
Patient insists on going out of the department to go to his car, or vending machine, or to get his medications.  The patient was advised that he may not leave the emergency room department at this time.  Patient requests items every few minutes.

## 2013-01-11 NOTE — ED Notes (Addendum)
Pt resting comfortbaly, calm cooperative, denies HI and SI. States he just wants detox from drugs and alcohol. Sitter at bedside

## 2013-01-11 NOTE — ED Notes (Signed)
Pt states that when he is drinking alcohol, he feels like hurting other people, but not all the time.

## 2013-01-11 NOTE — ED Notes (Addendum)
Pt says he "needs detox", for alcohol and drugs.  Says he has been drinking alcohol and using cocaine today.  Alert, cooperative at triage.  Security called for wanding.

## 2013-01-11 NOTE — ED Provider Notes (Addendum)
History    This chart was scribed for Raeford Razor, MD, MD by Smitty Pluck, ED Scribe. The patient was seen in room APA16A/APA16A and the patient's care was started at 1:44 PM.   CSN: [id number]  Arrival date & time 01/11/13  1247      Chief Complaint  Patient presents with  . V70.1    The history is provided by the patient. No language interpreter was used.   [name] [name] is a 52 y.o. male who presents to the Emergency Department wanting drug detox. Pt reports that he last used cocaine today. He reports drinking alcohol today. He states that he has been in rehab in [place] a couple of years ago but he started using drugs after leaving. Pt states that he wants to stop using drugs because his drug addiction has caused issues in his relationship with his girlfriend. He mentions having mild back pain. He denies SI, HI, fever, chills, nausea, vomiting, headache, rash and any other pain.   Past Medical History  Diagnosis Date  . Bronchitis   . Acid reflux   . Pancreatitis     March 2013  . Gout   . Bipolar disorder   . Hyperlipidemia   . Alcohol abuse     6-8 cans of beer daily  . Pseudocyst of pancreas 02/28/2012  . Arthritis   . Fracture of lower leg   . Hypertension   . Chronic pain   . Chronic neck pain   . Chronic back pain   . Left arm pain     chronic    Past Surgical History  Procedure Date  . Nose surgery     broken nose  . Circumcision 03/17/2012    Procedure: CIRCUMCISION ADULT;  Surgeon: Ky Barban, MD;  Location: [facility];  Service: Urology;  Laterality: N/A;  . Colonoscopy with propofol 12/24/2012    Procedure: COLONOSCOPY WITH PROPOFOL;  Surgeon: Corbin Ade, MD;  Location: [facility];  Service: Endoscopy;  Laterality: N/A;  started at 0803, in cecum at 0812, withdrawel time Biopsy of anal lesion  . Esophagogastroduodenoscopy (egd) with propofol 12/24/2012    Procedure: ESOPHAGOGASTRODUODENOSCOPY (EGD) WITH PROPOFOL;  Surgeon: Corbin Ade, MD;   Location: [facility];  Service: Endoscopy;  Laterality: N/A;  done at 0800    Family History  Problem Relation Age of Onset  . Diabetes Father   . Anesthesia problems Neg Hx   . Hypotension Neg Hx   . Malignant hyperthermia Neg Hx   . Pseudochol deficiency Neg Hx   . Colon cancer Neg Hx     History  Substance Use Topics  . Smoking status: Current Every Day Smoker -- 0.5 packs/day for 25 years    Types: Cigarettes  . Smokeless tobacco: Not on file  . Alcohol Use: 0.0 oz/week     Comment: 6-8 beers a day      Review of Systems  All other systems reviewed and are negative.    Allergies  Penicillins  Home Medications   Current Outpatient Rx  Name  Route  Sig  Dispense  Refill  . ALBUTEROL SULFATE HFA 108 (90 BASE) MCG/ACT IN AERS   Inhalation   Inhale 2 puffs into the lungs every 4 (four) hours as needed for wheezing.   1 Inhaler   1   . AMLODIPINE BESYLATE 5 MG PO TABS   Oral   Take 1 tablet (5 mg total) by mouth daily.   30 tablet  4   . CITALOPRAM HYDROBROMIDE 20 MG PO TABS   Oral   Take 1 tablet (20 mg total) by mouth daily.   30 tablet   2   . FEXOFENADINE HCL 180 MG PO TABS   Oral   Take 1 tablet (180 mg total) by mouth daily.   30 tablet   6   . OMEGA-3 FATTY ACIDS 1000 MG PO CAPS   Oral   Take 1 g by mouth daily.          Marland Kitchen GEMFIBROZIL 600 MG PO TABS   Oral   Take 1 tablet (600 mg total) by mouth 2 (two) times daily before a meal.   60 tablet   3   . MELOXICAM 7.5 MG PO TABS   Oral   Take 7.5 mg by mouth 2 (two) times daily as needed.          . ADULT MULTIVITAMIN W/MINERALS CH   Oral   Take 1 tablet by mouth daily.         Marland Kitchen NAPROXEN 500 MG PO TABS   Oral   Take 1 tablet (500 mg total) by mouth 2 (two) times daily with a meal.   56 tablet   0   . ONDANSETRON HCL 4 MG PO TABS   Oral   Take 1 tablet (4 mg total) by mouth every 6 (six) hours as needed for nausea.   20 tablet   0   . OXYCODONE-ACETAMINOPHEN 7.5-325 MG PO  TABS   Oral   Take 1 tablet by mouth every 6 (six) hours as needed for pain.   60 tablet   0   . PANTOPRAZOLE SODIUM 40 MG PO TBEC   Oral   Take 1 tablet (40 mg total) by mouth 2 (two) times daily.   60 tablet   3   . TAMSULOSIN HCL 0.4 MG PO CAPS   Oral   Take 1 capsule (0.4 mg total) by mouth daily.   30 capsule   6   . TIZANIDINE HCL 4 MG PO CAPS   Oral   Take 1 capsule (4 mg total) by mouth 2 (two) times daily as needed for muscle spasms.   60 capsule   2   . TRAZODONE HCL 100 MG PO TABS   Oral   Take 100 mg by mouth at bedtime as needed. Sleep           BP 141/100  Pulse 93  Temp 97.9 F (36.6 C) (Oral)  Resp 20  Ht 5' 9.5" (1.765 m)  Wt 176 lb (79.833 kg)  BMI 25.62 kg/m2  SpO2 97%  Physical Exam  Nursing note and vitals reviewed. Constitutional: He appears well-developed and well-nourished. No distress.  HENT:  Head: Normocephalic and atraumatic.  Eyes: Conjunctivae normal are normal. Pupils are equal, round, and reactive to light. Right eye exhibits no discharge. Left eye exhibits no discharge.  Neck: Neck supple.  Cardiovascular: Normal rate, regular rhythm and normal heart sounds.  Exam reveals no gallop and no friction rub.   No murmur heard. Pulmonary/Chest: Effort normal and breath sounds normal. No respiratory distress.  Abdominal: Soft. He exhibits no distension. There is no tenderness.  Musculoskeletal: He exhibits no edema and no tenderness.  Neurological: He is alert. No cranial nerve deficit. He exhibits normal muscle tone.       Speech clear and content appropriate. Does not appear to be responding to internal stimuli.   Skin: Skin is warm  and dry.  Psychiatric: He has a normal mood and affect. His behavior is normal. Thought content normal.    ED Course  Procedures (including critical care time) DIAGNOSTIC STUDIES: Oxygen Saturation is 97% on room air, normal by my interpretation.    COORDINATION OF CARE: 1:49 PM Discussed ED  treatment with pt  2:45 PM Ordered:  Medications  guaiFENesin (MUCINEX) 600 MG 12 hr tablet (not administered)  alum & mag hydroxide-simeth (MAALOX/MYLANTA) 200-200-20 MG/5ML suspension 30 mL (not administered)  ondansetron (ZOFRAN) tablet 4 mg (not administered)  nicotine (NICODERM CQ - dosed in mg/24 hours) patch 21 mg (not administered)  ibuprofen (ADVIL,MOTRIN) tablet 600 mg (not administered)  LORazepam (ATIVAN) tablet 1 mg (not administered)  nicotine (NICODERM CQ - dosed in mg/24 hours) 21 mg/24hr patch (21 mg  Patch Applied 01/11/13 1326)       Labs Reviewed  COMPREHENSIVE METABOLIC PANEL - Abnormal; Notable for the following:    AST 65 (*)     All other components within normal limits  ETHANOL - Abnormal; Notable for the following:    Alcohol, Ethyl (B) 203 (*)     All other components within normal limits  SALICYLATE LEVEL - Abnormal; Notable for the following:    Salicylate Lvl <2.0 (*)     All other components within normal limits  URINE RAPID DRUG SCREEN (HOSP PERFORMED) - Abnormal; Notable for the following:    Cocaine POSITIVE (*)     Tetrahydrocannabinol POSITIVE (*)     All other components within normal limits  URINALYSIS, ROUTINE W REFLEX MICROSCOPIC - Abnormal; Notable for the following:    Ketones, ur TRACE (*)     All other components within normal limits  ACETAMINOPHEN LEVEL  CBC   No results found.   1. Alcohol abuse   2. Drug abuse       MDM  51yM with polysubstance abuse. Requesting detox. Ethanol level over 200. No SI/HI or evidence of psychosis. Will discuss with [facility] for possible placement. If cannot, then DC with outpt resources.      I personally preformed the services scribed in my presence. The recorded information has been reviewed is accurate. Raeford Razor, MD.     Raeford Razor, MD 01/11/13 2156

## 2013-01-11 NOTE — ED Notes (Signed)
flomax was brought down by [facility] and misplaced, AC contacted to bring me another. Will give as soon as available

## 2013-01-11 NOTE — BH Assessment (Addendum)
Assessment Note   [name] [name] is an 52 y.o. male. The patient comes to the ED accompanied by his significant other. He is requesting detox. He is drinking daily, using cocaine at least weekly and smokes pot occasionally. He was last in treatment about 2 years ago at [facility]. He reports relapse shortly after discharge. His longest period of sobriety is 6 months.  He is neither suicidal nor homicidal. He has no history of attempts. He has no history of violence. He reports no withdrawal at this time. His blood alcohol is 203 and he is positive for cocaine and marijuana. He is not psychotic. He is not being seen on an outpatient bases. He is positive for change and has a supportive significant other. Calls placed to [facility]-no male beds:call to [facility]-no beds:call to [facility] male beds. Will call [facility] as soon as assessment is complete. Discussed progress with Dr.  Marylen Ponto. Referrals completed for [facility] and [facility] for consideration and to be placed on wait list.  Call to [facility], no male beds, asked to call in the am.  Axis I:  Polysubstance Abuse Dependence Axis II: Deferred Axis III:  Past Medical History  Diagnosis Date  . Bronchitis   . Acid reflux   . Pancreatitis     March 2013  . Gout   . Bipolar disorder   . Hyperlipidemia   . Alcohol abuse     6-8 cans of beer daily  . Pseudocyst of pancreas 02/28/2012  . Arthritis   . Fracture of lower leg   . Hypertension   . Chronic pain   . Chronic neck pain   . Chronic back pain   . Left arm pain     chronic   Axis IV: economic problems, problems related to legal system/crime and problems with primary support group Axis V: 31-40 impairment in reality testing  Past Medical History:  Past Medical History  Diagnosis Date  . Bronchitis   . Acid reflux   . Pancreatitis     March 2013  . Gout   . Bipolar disorder   . Hyperlipidemia   . Alcohol abuse     6-8 cans of beer daily  . Pseudocyst of pancreas 02/28/2012  . Arthritis   . Fracture  of lower leg   . Hypertension   . Chronic pain   . Chronic neck pain   . Chronic back pain   . Left arm pain     chronic    Past Surgical History  Procedure Date  . Nose surgery     broken nose  . Circumcision 03/17/2012    Procedure: CIRCUMCISION ADULT;  Surgeon: Ky Barban, MD;  Location: [facility];  Service: Urology;  Laterality: N/A;  . Colonoscopy with propofol 12/24/2012    Procedure: COLONOSCOPY WITH PROPOFOL;  Surgeon: Corbin Ade, MD;  Location: [facility];  Service: Endoscopy;  Laterality: N/A;  started at 0803, in cecum at 0812, withdrawel time Biopsy of anal lesion  . Esophagogastroduodenoscopy (egd) with propofol 12/24/2012    Procedure: ESOPHAGOGASTRODUODENOSCOPY (EGD) WITH PROPOFOL;  Surgeon: Corbin Ade, MD;  Location: [facility];  Service: Endoscopy;  Laterality: N/A;  done at 0800    Family History:  Family History  Problem Relation Age of Onset  . Diabetes Father   . Anesthesia problems Neg Hx   . Hypotension Neg Hx   . Malignant hyperthermia Neg Hx   . Pseudochol deficiency Neg Hx   . Colon cancer Neg  Hx     Social History:  reports that he has been smoking Cigarettes.  He has a 12.5 pack-year smoking history. He does not have any smokeless tobacco history on file. He reports that he drinks alcohol. He reports that he uses illicit drugs (Cocaine).  Additional Social History:     CIWA: CIWA-Ar BP: 141/100 mmHg Pulse Rate: 93  Nausea and Vomiting: no nausea and no vomiting Tactile Disturbances: none Tremor: no tremor Auditory Disturbances: not present Paroxysmal Sweats: no sweat visible Visual Disturbances: not present Anxiety: mildly anxious Headache, Fullness in Head: none present Agitation: normal activity Orientation and Clouding of Sensorium: oriented and can do serial additions CIWA-Ar Total: 1  COWS:    Allergies:  Allergies  Allergen Reactions  . Penicillins Itching    Home Medications:  (Not in a hospital admission)  OB/GYN  Status:  No LMP for male patient.  General Assessment Data Location of Assessment: [facility] Assessment: Yes Living Arrangements: Spouse/significant other (girlfriend) Can pt return to current living arrangement?: Yes Admission Status: Voluntary Is patient capable of signing voluntary admission?: Yes Transfer from: Acute Hospital Referral Source: MD  Education Status Is patient currently in school?: No  Risk to self Suicidal Ideation: No Suicidal Intent: No Is patient at risk for suicide?: No Suicidal Plan?: No Access to Means: No What has been your use of drugs/alcohol within the last 12 months?: daily etoh, occass pot and cocaine Previous Attempts/Gestures: No How many times?: 0  Other Self Harm Risks: continues to drink and drug Triggers for Past Attempts: None known Intentional Self Injurious Behavior: None Family Suicide History: No Recent stressful life event(s): Legal Issues (DWI) Persecutory voices/beliefs?: No Depression: No Substance abuse history and/or treatment for substance abuse?: Yes Suicide prevention information given to non-admitted patients: Not applicable  Risk to Others Homicidal Ideation: No Thoughts of Harm to Others: No Current Homicidal Intent: No Current Homicidal Plan: No Access to Homicidal Means: No History of harm to others?: No Assessment of Violence: None Noted Does patient have access to weapons?: No Criminal Charges Pending?: No Does patient have a court date: Yes Court Date: 01/28/13 (DWI)  Psychosis Hallucinations: None noted Delusions: None noted  Mental Status Report Appear/Hygiene: Disheveled Eye Contact: Poor Motor Activity: Freedom of movement Speech: Logical/coherent;Slurred (monotone) Level of Consciousness: Alert Mood: Labile Affect: Blunted Anxiety Level: None Thought Processes: Coherent;Relevant Judgement: Unimpaired Orientation: Person;Place;Time Obsessive Compulsive Thoughts/Behaviors: None  Cognitive  Functioning Concentration: Decreased Memory: Recent Intact;Remote Intact IQ: Average Insight: Poor Impulse Control: Poor Appetite: Good Weight Loss: 0  Weight Gain: 0  Sleep: No Change Total Hours of Sleep: 7  Vegetative Symptoms: Decreased grooming  ADLScreening [facility] Assessment Services) Patient's cognitive ability adequate to safely complete daily activities?: Yes Patient able to express need for assistance with ADLs?: Yes Independently performs ADLs?: Yes (appropriate for developmental age)  Abuse/Neglect [facility]) Physical Abuse: Denies Verbal Abuse: Denies Sexual Abuse: Denies  Prior Inpatient Therapy Prior Inpatient Therapy: Yes Prior Therapy Dates: 2012 Prior Therapy Facilty/Provider(s): [facility] Reason for Treatment: detox  Prior Outpatient Therapy Prior Outpatient Therapy: No  ADL Screening (condition at time of admission) Patient's cognitive ability adequate to safely complete daily activities?: Yes Patient able to express need for assistance with ADLs?: Yes Independently performs ADLs?: Yes (appropriate for developmental age)       Abuse/Neglect Assessment (Assessment to be complete while patient is alone) Physical Abuse: Denies Verbal Abuse: Denies Sexual Abuse: Denies Values / Beliefs Cultural Requests During Hospitalization: None Spiritual Requests During  Hospitalization: None        Additional Information 1:1 In Past 12 Months?: No CIRT Risk: No Elopement Risk: No Does patient have medical clearance?: Yes     Disposition:  Disposition Disposition of Patient: Inpatient treatment program;Referred to Patient referred to: [facility];Other (Comment) ([facility], [facility])  On Site Evaluation by:   Reviewed with Physician:     Jearld Pies 01/11/2013 4:46 PM

## 2013-01-11 NOTE — Progress Notes (Signed)
PT ACCEPTED AT [facility] BY SPENCER NP, PENDING A BED,  ALSO RECEIVED CALL FROM MICHELLE AT [facility] PT ACCEPTED TO WAIT LIST PENDING A BED IN THE MORNING.  INFORMED ERIC, THE AC  AT [facility] AND DR Freida Busman.

## 2013-01-12 ENCOUNTER — Encounter (HOSPITAL_COMMUNITY): Payer: Self-pay | Admitting: Emergency Medicine

## 2013-01-12 MED ORDER — TIZANIDINE HCL 4 MG PO TABS
4.0000 mg | ORAL_TABLET | Freq: Two times a day (BID) | ORAL | Status: DC | PRN
  Filled 2013-01-12: qty 1

## 2013-01-12 NOTE — ED Notes (Signed)
RN attempted to call report x 2 to [name] [name]. Allowed phone to ring for 5+ minutes each time to unit with no answer. Will attempt again.

## 2013-01-12 NOTE — ED Notes (Signed)
Report given to Denny Peon, RN with [facility].

## 2013-01-12 NOTE — ED Provider Notes (Signed)
Patient accepted at [facility].  Patient resting comfortably with no new problems or complaints overnight.  Appears stable for transport.  Geoffery Lyons, MD 01/12/13 [phone]

## 2013-01-12 NOTE — ED Notes (Addendum)
Report given to [facility]. ETA 20 minutes. ?

## 2013-01-12 NOTE — ED Notes (Addendum)
Still attempting to call report to [facility]. Have attempted multiple times. Finally able to reach Jeannett Senior on main number and he contacted floor.

## 2013-01-13 ENCOUNTER — Telehealth: Payer: Self-pay | Admitting: Family Medicine

## 2013-01-13 NOTE — Telephone Encounter (Signed)
meds should be supplied there

## 2013-01-27 ENCOUNTER — Encounter (HOSPITAL_COMMUNITY): Payer: Self-pay | Admitting: *Deleted

## 2013-01-27 ENCOUNTER — Emergency Department (HOSPITAL_COMMUNITY)
Admission: EM | Admit: 2013-01-27 | Discharge: 2013-01-27 | Disposition: A | Discharge status: Rehabilitation Facility | Payer: Medicare Other | Attending: Emergency Medicine | Admitting: Emergency Medicine

## 2013-01-27 DIAGNOSIS — Z79899 Other long term (current) drug therapy: Secondary | ICD-10-CM | POA: Insufficient documentation

## 2013-01-27 DIAGNOSIS — G8929 Other chronic pain: Secondary | ICD-10-CM

## 2013-01-27 DIAGNOSIS — F1010 Alcohol abuse, uncomplicated: Secondary | ICD-10-CM | POA: Insufficient documentation

## 2013-01-27 DIAGNOSIS — M549 Dorsalgia, unspecified: Secondary | ICD-10-CM

## 2013-01-27 DIAGNOSIS — M545 Low back pain: Secondary | ICD-10-CM

## 2013-01-27 DIAGNOSIS — Z8719 Personal history of other diseases of the digestive system: Secondary | ICD-10-CM | POA: Insufficient documentation

## 2013-01-27 DIAGNOSIS — F319 Bipolar disorder, unspecified: Secondary | ICD-10-CM | POA: Insufficient documentation

## 2013-01-27 DIAGNOSIS — Z8781 Personal history of (healed) traumatic fracture: Secondary | ICD-10-CM | POA: Insufficient documentation

## 2013-01-27 DIAGNOSIS — L0231 Cutaneous abscess of buttock: Secondary | ICD-10-CM | POA: Insufficient documentation

## 2013-01-27 DIAGNOSIS — Z8739 Personal history of other diseases of the musculoskeletal system and connective tissue: Secondary | ICD-10-CM | POA: Insufficient documentation

## 2013-01-27 DIAGNOSIS — Z8709 Personal history of other diseases of the respiratory system: Secondary | ICD-10-CM | POA: Insufficient documentation

## 2013-01-27 DIAGNOSIS — Z862 Personal history of diseases of the blood and blood-forming organs and certain disorders involving the immune mechanism: Secondary | ICD-10-CM | POA: Insufficient documentation

## 2013-01-27 DIAGNOSIS — E785 Hyperlipidemia, unspecified: Secondary | ICD-10-CM | POA: Insufficient documentation

## 2013-01-27 DIAGNOSIS — I10 Essential (primary) hypertension: Secondary | ICD-10-CM | POA: Insufficient documentation

## 2013-01-27 DIAGNOSIS — F17200 Nicotine dependence, unspecified, uncomplicated: Secondary | ICD-10-CM | POA: Insufficient documentation

## 2013-01-27 DIAGNOSIS — Z8639 Personal history of other endocrine, nutritional and metabolic disease: Secondary | ICD-10-CM | POA: Insufficient documentation

## 2013-01-27 DIAGNOSIS — L0291 Cutaneous abscess, unspecified: Secondary | ICD-10-CM

## 2013-01-27 MED ORDER — METHOCARBAMOL 750 MG PO TABS: 750.0000 mg | ORAL_TABLET | Freq: Four times a day (QID) | ORAL | Status: DC | PRN

## 2013-01-27 MED ORDER — CYCLOBENZAPRINE HCL 10 MG PO TABS
10.0000 mg | ORAL_TABLET | Freq: Once | ORAL | Status: AC
  Administered 2013-01-27: 10 mg via ORAL
  Filled 2013-01-27: qty 1

## 2013-01-27 MED ORDER — MELOXICAM 15 MG PO TABS: 15.0000 mg | ORAL_TABLET | Freq: Every day | ORAL | Status: DC

## 2013-01-27 NOTE — ED Notes (Signed)
Pt is post detox etoh and is in a program and per paperwork is not to have narcotics, benzos, or ultram to remain in the program.  Pt is here with chronic lower back pain that radiates down left leg.  Abscess between butt cheeks

## 2013-01-27 NOTE — ED Provider Notes (Signed)
Medical screening examination/treatment/procedure(s) were performed by non-physician practitioner and as supervising physician I was immediately available for consultation/collaboration.  Gilda Crease, MD 01/27/13 1700
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
## 2013-01-28 ENCOUNTER — Telehealth: Payer: Self-pay | Admitting: Family Medicine

## 2013-01-28 ENCOUNTER — Encounter (HOSPITAL_COMMUNITY): Payer: Self-pay

## 2013-01-28 ENCOUNTER — Ambulatory Visit: Payer: Medicare Other | Admitting: Orthopedic Surgery

## 2013-01-28 ENCOUNTER — Emergency Department (HOSPITAL_COMMUNITY)
Admission: EM | Admit: 2013-01-28 | Discharge: 2013-01-28 | Disposition: A | Discharge status: Home / Self Care | Payer: Medicare Other | Attending: Emergency Medicine | Admitting: Emergency Medicine

## 2013-01-28 ENCOUNTER — Ambulatory Visit (INDEPENDENT_AMBULATORY_CARE_PROVIDER_SITE_OTHER): Payer: Medicare Other

## 2013-01-28 VITALS — BP 140/90 | Wt 188.0 lb

## 2013-01-28 DIAGNOSIS — F319 Bipolar disorder, unspecified: Secondary | ICD-10-CM | POA: Insufficient documentation

## 2013-01-28 DIAGNOSIS — M545 Low back pain, unspecified: Secondary | ICD-10-CM | POA: Insufficient documentation

## 2013-01-28 DIAGNOSIS — G8929 Other chronic pain: Secondary | ICD-10-CM

## 2013-01-28 DIAGNOSIS — Z8709 Personal history of other diseases of the respiratory system: Secondary | ICD-10-CM | POA: Insufficient documentation

## 2013-01-28 DIAGNOSIS — E785 Hyperlipidemia, unspecified: Secondary | ICD-10-CM | POA: Insufficient documentation

## 2013-01-28 DIAGNOSIS — Z8781 Personal history of (healed) traumatic fracture: Secondary | ICD-10-CM | POA: Insufficient documentation

## 2013-01-28 DIAGNOSIS — Z8719 Personal history of other diseases of the digestive system: Secondary | ICD-10-CM | POA: Insufficient documentation

## 2013-01-28 DIAGNOSIS — Z79899 Other long term (current) drug therapy: Secondary | ICD-10-CM | POA: Insufficient documentation

## 2013-01-28 DIAGNOSIS — Z8739 Personal history of other diseases of the musculoskeletal system and connective tissue: Secondary | ICD-10-CM | POA: Insufficient documentation

## 2013-01-28 DIAGNOSIS — M549 Dorsalgia, unspecified: Secondary | ICD-10-CM

## 2013-01-28 DIAGNOSIS — I10 Essential (primary) hypertension: Secondary | ICD-10-CM | POA: Insufficient documentation

## 2013-01-28 DIAGNOSIS — F17200 Nicotine dependence, unspecified, uncomplicated: Secondary | ICD-10-CM | POA: Insufficient documentation

## 2013-01-28 DIAGNOSIS — K219 Gastro-esophageal reflux disease without esophagitis: Secondary | ICD-10-CM | POA: Insufficient documentation

## 2013-01-28 DIAGNOSIS — M109 Gout, unspecified: Secondary | ICD-10-CM | POA: Insufficient documentation

## 2013-01-28 MED ORDER — KETOROLAC TROMETHAMINE 30 MG/ML IJ SOLN
30.0000 mg | Freq: Once | INTRAMUSCULAR | Status: AC
  Administered 2013-01-28: 30 mg via INTRAMUSCULAR

## 2013-01-28 MED ORDER — DEXAMETHASONE SODIUM PHOSPHATE 4 MG/ML IJ SOLN
10.0000 mg | Freq: Once | INTRAMUSCULAR | Status: AC
  Administered 2013-01-28: 10 mg via INTRAVENOUS
  Filled 2013-01-28: qty 3

## 2013-01-28 NOTE — ED Provider Notes (Signed)
I personally performed the services described in this documentation, which was scribed in my presence. The recorded information has been reviewed and is accurate.  Nicoletta Dress. Colon Branch, MD 01/28/13 [phone]

## 2013-01-28 NOTE — Telephone Encounter (Signed)
meloxicam too expensive. Wants steroid

## 2013-01-28 NOTE — Telephone Encounter (Signed)
He can take the naprosyn, he was given 2 shots, no steroids needed  Please tell him to schedule an appointment for his back pain

## 2013-01-28 NOTE — ED Notes (Signed)
Pt states he has chronic back problems and has been in rehab for drugs an alcohol until he signed out today to come here for treatment for his back.  Pt states he has only been receiving ibuprofen and naproxyn for pain and it has not helped.
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
## 2013-01-29 NOTE — Telephone Encounter (Signed)
Pt aware.

## 2013-02-01 ENCOUNTER — Ambulatory Visit (INDEPENDENT_AMBULATORY_CARE_PROVIDER_SITE_OTHER): Payer: Medicare Other | Admitting: Family Medicine

## 2013-02-01 ENCOUNTER — Encounter: Payer: Self-pay | Admitting: Family Medicine

## 2013-02-01 VITALS — BP 168/80 | HR 94 | Resp 18 | Ht 70.0 in | Wt 183.0 lb

## 2013-02-01 DIAGNOSIS — M5136 Other intervertebral disc degeneration, lumbar region: Secondary | ICD-10-CM

## 2013-02-01 DIAGNOSIS — J069 Acute upper respiratory infection, unspecified: Secondary | ICD-10-CM | POA: Insufficient documentation

## 2013-02-01 DIAGNOSIS — G8929 Other chronic pain: Secondary | ICD-10-CM

## 2013-02-01 DIAGNOSIS — F1910 Other psychoactive substance abuse, uncomplicated: Secondary | ICD-10-CM

## 2013-02-01 DIAGNOSIS — F1010 Alcohol abuse, uncomplicated: Secondary | ICD-10-CM

## 2013-02-01 DIAGNOSIS — I10 Essential (primary) hypertension: Secondary | ICD-10-CM

## 2013-02-01 DIAGNOSIS — M549 Dorsalgia, unspecified: Secondary | ICD-10-CM

## 2013-02-01 DIAGNOSIS — M5137 Other intervertebral disc degeneration, lumbosacral region: Secondary | ICD-10-CM

## 2013-02-01 MED ORDER — AZITHROMYCIN 250 MG PO TABS: ORAL_TABLET | ORAL | Status: AC

## 2013-02-01 MED ORDER — CITALOPRAM HYDROBROMIDE 20 MG PO TABS: 10.0000 mg | ORAL_TABLET | Freq: Every day | ORAL | Status: DC

## 2013-02-01 MED ORDER — METHYLPREDNISOLONE 4 MG PO KIT: PACK | ORAL | Status: DC

## 2013-02-01 NOTE — Patient Instructions (Addendum)
Take the steroid dosepak Take the antibiotics  Use the naprosyn twice a day Do not go to the ER for back pain You need to stop drinking! No pain medications Referral to Dr. Eduard Clos for your back  F/U in April

## 2013-02-01 NOTE — Progress Notes (Signed)
  Subjective:    Patient ID: [name] [name], male    DOB: 03-07-1961, 52 y.o.   MRN: [id number]  HPI  Patient here to followup multiple ER visits. He's been to the ER twice since I seen him last in January secondary to back pain. [facility] 21 for 10 days and then he was sent to [facility] however he left this program because he states he cannot sit in the chair stooped because of his back pain. Of note his urine drug screen was positive for both marijuana and cocaine use and trying to get pain medicines from the emergency room which they have not been giving. He states that his back pain continues to go down his legs he has been evaluated by neurosurgery did not think that there was a surgical knee. He was given a shot of Toradol in my office last week and he was given a shot of Depo-Medrol last week in the emergency room. He has Naprosyn as well muscle relaxant but states these do not help.  Regarding history of screen he states that he took the marijuana cocaine so he get into detox. Since he Rehab he's been drinking significantly  + cough and sinus drainage for the past week, using mucinex, continues to smoke   Review of Systems  GEN- denies fatigue, fever, weight loss,weakness, recent illness HEENT- denies eye drainage, change in vision,+ nasal discharge, CVS- denies chest pain, palpitations RESP- denies SOB, +cough, wheeze ABD- denies N/V, change in stools, abd pain GU- denies dysuria, hematuria, dribbling, incontinence MSK- + joint pain, muscle aches, injury Neuro- denies headache, dizziness, syncope, seizure activity      Objective:   Physical Exam GEN- NAD, alert and oriented x3 HEENT- PERRL, EOMI, non injected sclera, pink conjunctiva, MMM, oropharynx mild injection, TM clear bilat no effusion, mild wax bilat + maxillary sinus tenderness, nares clear rhinorrhea Neck- Supple, shotty LAD CVS- RRR, no murmur RESP-CTAB EXT- No edema Pulses-  Radial 2+         Assessment & Plan:

## 2013-02-01 NOTE — Assessment & Plan Note (Signed)
URI symptoms however he has been a high-risk environment with his detox and hospitalization therefore we'll put him on antibiotics Medrol Dosepak for the sinus congestion and his back pain

## 2013-02-01 NOTE — Assessment & Plan Note (Signed)
He has failed his urine drug screen contract this will be discontinued no more narcotics from my office he will be continued on Robaxin as well as Naprosyn

## 2013-02-01 NOTE — Assessment & Plan Note (Signed)
Ongoing alcohol abuse he states he plans to go back into detox after his back pain improves

## 2013-02-01 NOTE — Assessment & Plan Note (Signed)
He denies any recent use of cocaine, states he will enter detox again

## 2013-02-01 NOTE — Assessment & Plan Note (Signed)
Blood pressure very elevated today he is not taking this medication and he has been drinking discussed how he is killing himself with his alcohol abuse and drug abuse and not following through with his medications.

## 2013-02-01 NOTE — Assessment & Plan Note (Signed)
He does have degenerative disc disease as well as bulging disc MRI was done in August of 2013 I will send him for epidural injections No narcotic medications I discussed with him he's not to go to the emergency room for his chronic back pain no one will give him narcotic medication

## 2013-02-06 ENCOUNTER — Emergency Department (HOSPITAL_COMMUNITY)
Admission: EM | Admit: 2013-02-06 | Discharge: 2013-02-06 | Discharge status: Against Medical Advice | Payer: Medicare Other | Attending: Emergency Medicine | Admitting: Emergency Medicine

## 2013-02-06 ENCOUNTER — Encounter (HOSPITAL_COMMUNITY): Payer: Self-pay | Admitting: Emergency Medicine

## 2013-02-06 DIAGNOSIS — F319 Bipolar disorder, unspecified: Secondary | ICD-10-CM | POA: Insufficient documentation

## 2013-02-06 DIAGNOSIS — Z79899 Other long term (current) drug therapy: Secondary | ICD-10-CM | POA: Insufficient documentation

## 2013-02-06 DIAGNOSIS — M549 Dorsalgia, unspecified: Secondary | ICD-10-CM | POA: Insufficient documentation

## 2013-02-06 DIAGNOSIS — Z008 Encounter for other general examination: Secondary | ICD-10-CM | POA: Insufficient documentation

## 2013-02-06 DIAGNOSIS — M129 Arthropathy, unspecified: Secondary | ICD-10-CM | POA: Insufficient documentation

## 2013-02-06 DIAGNOSIS — Z8739 Personal history of other diseases of the musculoskeletal system and connective tissue: Secondary | ICD-10-CM | POA: Insufficient documentation

## 2013-02-06 DIAGNOSIS — Z8781 Personal history of (healed) traumatic fracture: Secondary | ICD-10-CM | POA: Insufficient documentation

## 2013-02-06 DIAGNOSIS — Z8709 Personal history of other diseases of the respiratory system: Secondary | ICD-10-CM | POA: Insufficient documentation

## 2013-02-06 DIAGNOSIS — K219 Gastro-esophageal reflux disease without esophagitis: Secondary | ICD-10-CM | POA: Insufficient documentation

## 2013-02-06 DIAGNOSIS — F17200 Nicotine dependence, unspecified, uncomplicated: Secondary | ICD-10-CM | POA: Insufficient documentation

## 2013-02-06 DIAGNOSIS — I10 Essential (primary) hypertension: Secondary | ICD-10-CM | POA: Insufficient documentation

## 2013-02-06 DIAGNOSIS — G8929 Other chronic pain: Secondary | ICD-10-CM | POA: Insufficient documentation

## 2013-02-06 DIAGNOSIS — F1410 Cocaine abuse, uncomplicated: Secondary | ICD-10-CM | POA: Insufficient documentation

## 2013-02-06 DIAGNOSIS — F1210 Cannabis abuse, uncomplicated: Secondary | ICD-10-CM | POA: Insufficient documentation

## 2013-02-06 DIAGNOSIS — Z8719 Personal history of other diseases of the digestive system: Secondary | ICD-10-CM | POA: Insufficient documentation

## 2013-02-06 DIAGNOSIS — M542 Cervicalgia: Secondary | ICD-10-CM | POA: Insufficient documentation

## 2013-02-06 DIAGNOSIS — E785 Hyperlipidemia, unspecified: Secondary | ICD-10-CM | POA: Insufficient documentation

## 2013-02-06 LAB — CBC
HCT: 40.8 % (ref 39.0–52.0)
Hemoglobin: 14.2 g/dL (ref 13.0–17.0)
MCH: 33.6 pg (ref 26.0–34.0)
MCHC: 34.8 g/dL (ref 30.0–36.0)
MCV: 96.7 fL (ref 78.0–100.0)
Platelets: 438 10*3/uL — ABNORMAL HIGH (ref 150–400)
RBC: 4.22 MIL/uL (ref 4.22–5.81)
RDW: 13.9 % (ref 11.5–15.5)
WBC: 5.2 10*3/uL (ref 4.0–10.5)

## 2013-02-06 LAB — COMPREHENSIVE METABOLIC PANEL
ALT: 12 U/L (ref 0–53)
AST: 30 U/L (ref 0–37)
Albumin: 4.1 g/dL (ref 3.5–5.2)
Alkaline Phosphatase: 55 U/L (ref 39–117)
BUN: 15 mg/dL (ref 6–23)
CO2: 25 mEq/L (ref 19–32)
Calcium: 9.4 mg/dL (ref 8.4–10.5)
Chloride: 97 mEq/L (ref 96–112)
Creatinine, Ser: 0.91 mg/dL (ref 0.50–1.35)
GFR calc Af Amer: >90 mL/min (ref >90)
GFR calc non Af Amer: >90 mL/min (ref >90)
Glucose, Bld: 120 mg/dL — ABNORMAL HIGH (ref 70–99)
Potassium: 3.8 mEq/L (ref 3.5–5.1)
Sodium: 135 mEq/L (ref 135–145)
Total Bilirubin: 0.2 mg/dL — ABNORMAL LOW (ref 0.3–1.2)
Total Protein: 7.4 g/dL (ref 6.0–8.3)

## 2013-02-06 LAB — RAPID URINE DRUG SCREEN, HOSP PERFORMED
Amphetamines: POSITIVE — AB
Barbiturates: NOT DETECTED
Benzodiazepines: POSITIVE — AB
Cocaine: POSITIVE — AB
Opiates: NOT DETECTED
Tetrahydrocannabinol: NOT DETECTED

## 2013-02-06 LAB — ACETAMINOPHEN LEVEL: Acetaminophen (Tylenol), Serum: <15 ug/mL (ref 10–30)

## 2013-02-06 LAB — ETHANOL: Alcohol, Ethyl (B): 95 mg/dL — ABNORMAL HIGH (ref 0–11)

## 2013-02-06 LAB — SALICYLATE LEVEL: Salicylate Lvl: <2 mg/dL — ABNORMAL LOW (ref 2.8–20.0)

## 2013-02-06 NOTE — ED Notes (Signed)
Pt told advocate that he was going home. We could call him with his results. Left ambulatory with another pt. EDP aware

## 2013-02-06 NOTE — ED Notes (Signed)
Patient states he has chronic back pain and wants help to detox from drug and ETOH.

## 2013-02-13 ENCOUNTER — Emergency Department (HOSPITAL_COMMUNITY)
Admission: EM | Admit: 2013-02-13 | Discharge: 2013-02-13 | Disposition: A | Discharge status: Home / Self Care | Payer: Medicare Other | Source: Home / Self Care | Attending: Emergency Medicine | Admitting: Emergency Medicine

## 2013-02-13 ENCOUNTER — Emergency Department (HOSPITAL_COMMUNITY): Payer: Medicare Other

## 2013-02-13 ENCOUNTER — Encounter (HOSPITAL_COMMUNITY): Payer: Self-pay | Admitting: Emergency Medicine

## 2013-02-13 DIAGNOSIS — Z87828 Personal history of other (healed) physical injury and trauma: Secondary | ICD-10-CM | POA: Insufficient documentation

## 2013-02-13 DIAGNOSIS — Z79899 Other long term (current) drug therapy: Secondary | ICD-10-CM | POA: Insufficient documentation

## 2013-02-13 DIAGNOSIS — Z862 Personal history of diseases of the blood and blood-forming organs and certain disorders involving the immune mechanism: Secondary | ICD-10-CM | POA: Insufficient documentation

## 2013-02-13 DIAGNOSIS — I10 Essential (primary) hypertension: Secondary | ICD-10-CM | POA: Insufficient documentation

## 2013-02-13 DIAGNOSIS — S93409A Sprain of unspecified ligament of unspecified ankle, initial encounter: Secondary | ICD-10-CM | POA: Insufficient documentation

## 2013-02-13 DIAGNOSIS — Y92410 Unspecified street and highway as the place of occurrence of the external cause: Secondary | ICD-10-CM | POA: Insufficient documentation

## 2013-02-13 DIAGNOSIS — IMO0002 Reserved for concepts with insufficient information to code with codable children: Secondary | ICD-10-CM | POA: Insufficient documentation

## 2013-02-13 DIAGNOSIS — Z8709 Personal history of other diseases of the respiratory system: Secondary | ICD-10-CM | POA: Insufficient documentation

## 2013-02-13 DIAGNOSIS — E785 Hyperlipidemia, unspecified: Secondary | ICD-10-CM | POA: Insufficient documentation

## 2013-02-13 DIAGNOSIS — S63601A Unspecified sprain of right thumb, initial encounter: Secondary | ICD-10-CM

## 2013-02-13 DIAGNOSIS — Z8739 Personal history of other diseases of the musculoskeletal system and connective tissue: Secondary | ICD-10-CM | POA: Insufficient documentation

## 2013-02-13 DIAGNOSIS — Z8719 Personal history of other diseases of the digestive system: Secondary | ICD-10-CM | POA: Insufficient documentation

## 2013-02-13 DIAGNOSIS — Z8659 Personal history of other mental and behavioral disorders: Secondary | ICD-10-CM | POA: Insufficient documentation

## 2013-02-13 DIAGNOSIS — S6390XA Sprain of unspecified part of unspecified wrist and hand, initial encounter: Secondary | ICD-10-CM | POA: Insufficient documentation

## 2013-02-13 DIAGNOSIS — Z8639 Personal history of other endocrine, nutritional and metabolic disease: Secondary | ICD-10-CM | POA: Insufficient documentation

## 2013-02-13 DIAGNOSIS — K219 Gastro-esophageal reflux disease without esophagitis: Secondary | ICD-10-CM | POA: Insufficient documentation

## 2013-02-13 DIAGNOSIS — F17200 Nicotine dependence, unspecified, uncomplicated: Secondary | ICD-10-CM | POA: Insufficient documentation

## 2013-02-13 DIAGNOSIS — S93402A Sprain of unspecified ligament of left ankle, initial encounter: Secondary | ICD-10-CM

## 2013-02-13 DIAGNOSIS — Y9389 Activity, other specified: Secondary | ICD-10-CM | POA: Insufficient documentation

## 2013-02-13 MED ORDER — HYDROCODONE-ACETAMINOPHEN 5-325 MG PO TABS
2.0000 | ORAL_TABLET | Freq: Once | ORAL | Status: AC
  Administered 2013-02-13: 2 via ORAL
  Filled 2013-02-13: qty 2

## 2013-02-13 MED ORDER — HYDROCODONE-ACETAMINOPHEN 5-325 MG PO TABS: 1.0000 | ORAL_TABLET | Freq: Four times a day (QID) | ORAL | Status: DC | PRN
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
## 2013-02-14 ENCOUNTER — Inpatient Hospital Stay (HOSPITAL_COMMUNITY)
Admission: EM | Admit: 2013-02-14 | Discharge: 2013-02-15 | DRG: 641 | Disposition: A | Discharge status: Home / Self Care | Payer: Medicare Other | Attending: Internal Medicine | Admitting: Internal Medicine

## 2013-02-14 ENCOUNTER — Emergency Department (HOSPITAL_COMMUNITY): Payer: Medicare Other

## 2013-02-14 ENCOUNTER — Encounter (HOSPITAL_COMMUNITY): Payer: Self-pay | Admitting: *Deleted

## 2013-02-14 DIAGNOSIS — S61219D Laceration without foreign body of unspecified finger without damage to nail, subsequent encounter: Secondary | ICD-10-CM

## 2013-02-14 DIAGNOSIS — S6980XA Other specified injuries of unspecified wrist, hand and finger(s), initial encounter: Secondary | ICD-10-CM | POA: Diagnosis present

## 2013-02-14 DIAGNOSIS — J069 Acute upper respiratory infection, unspecified: Secondary | ICD-10-CM

## 2013-02-14 DIAGNOSIS — M5136 Other intervertebral disc degeneration, lumbar region: Secondary | ICD-10-CM

## 2013-02-14 DIAGNOSIS — S6990XA Unspecified injury of unspecified wrist, hand and finger(s), initial encounter: Secondary | ICD-10-CM | POA: Diagnosis present

## 2013-02-14 DIAGNOSIS — S6991XA Unspecified injury of right wrist, hand and finger(s), initial encounter: Secondary | ICD-10-CM

## 2013-02-14 DIAGNOSIS — M549 Dorsalgia, unspecified: Secondary | ICD-10-CM

## 2013-02-14 DIAGNOSIS — M51369 Other intervertebral disc degeneration, lumbar region without mention of lumbar back pain or lower extremity pain: Secondary | ICD-10-CM

## 2013-02-14 DIAGNOSIS — N400 Enlarged prostate without lower urinary tract symptoms: Secondary | ICD-10-CM

## 2013-02-14 DIAGNOSIS — F10929 Alcohol use, unspecified with intoxication, unspecified: Secondary | ICD-10-CM | POA: Diagnosis present

## 2013-02-14 DIAGNOSIS — H5789 Other specified disorders of eye and adnexa: Secondary | ICD-10-CM

## 2013-02-14 DIAGNOSIS — F319 Bipolar disorder, unspecified: Secondary | ICD-10-CM | POA: Diagnosis present

## 2013-02-14 DIAGNOSIS — N529 Male erectile dysfunction, unspecified: Secondary | ICD-10-CM

## 2013-02-14 DIAGNOSIS — M129 Arthropathy, unspecified: Secondary | ICD-10-CM | POA: Diagnosis present

## 2013-02-14 DIAGNOSIS — F17200 Nicotine dependence, unspecified, uncomplicated: Secondary | ICD-10-CM | POA: Diagnosis present

## 2013-02-14 DIAGNOSIS — E669 Obesity, unspecified: Secondary | ICD-10-CM | POA: Diagnosis present

## 2013-02-14 DIAGNOSIS — G894 Chronic pain syndrome: Secondary | ICD-10-CM | POA: Diagnosis present

## 2013-02-14 DIAGNOSIS — R7401 Elevation of levels of liver transaminase levels: Secondary | ICD-10-CM

## 2013-02-14 DIAGNOSIS — S93409A Sprain of unspecified ligament of unspecified ankle, initial encounter: Secondary | ICD-10-CM | POA: Diagnosis present

## 2013-02-14 DIAGNOSIS — E871 Hypo-osmolality and hyponatremia: Principal | ICD-10-CM | POA: Diagnosis present

## 2013-02-14 DIAGNOSIS — M25531 Pain in right wrist: Secondary | ICD-10-CM

## 2013-02-14 DIAGNOSIS — F1010 Alcohol abuse, uncomplicated: Secondary | ICD-10-CM

## 2013-02-14 DIAGNOSIS — F1910 Other psychoactive substance abuse, uncomplicated: Secondary | ICD-10-CM

## 2013-02-14 DIAGNOSIS — Z9189 Other specified personal risk factors, not elsewhere classified: Secondary | ICD-10-CM | POA: Diagnosis present

## 2013-02-14 DIAGNOSIS — J40 Bronchitis, not specified as acute or chronic: Secondary | ICD-10-CM

## 2013-02-14 DIAGNOSIS — G8929 Other chronic pain: Secondary | ICD-10-CM

## 2013-02-14 DIAGNOSIS — S6991XS Unspecified injury of right wrist, hand and finger(s), sequela: Secondary | ICD-10-CM

## 2013-02-14 DIAGNOSIS — R1013 Epigastric pain: Secondary | ICD-10-CM

## 2013-02-14 DIAGNOSIS — E785 Hyperlipidemia, unspecified: Secondary | ICD-10-CM | POA: Diagnosis present

## 2013-02-14 DIAGNOSIS — W010XXA Fall on same level from slipping, tripping and stumbling without subsequent striking against object, initial encounter: Secondary | ICD-10-CM | POA: Diagnosis present

## 2013-02-14 DIAGNOSIS — Z720 Tobacco use: Secondary | ICD-10-CM

## 2013-02-14 DIAGNOSIS — K863 Pseudocyst of pancreas: Secondary | ICD-10-CM

## 2013-02-14 DIAGNOSIS — K219 Gastro-esophageal reflux disease without esophagitis: Secondary | ICD-10-CM

## 2013-02-14 DIAGNOSIS — R112 Nausea with vomiting, unspecified: Secondary | ICD-10-CM | POA: Diagnosis present

## 2013-02-14 DIAGNOSIS — M542 Cervicalgia: Secondary | ICD-10-CM

## 2013-02-14 DIAGNOSIS — J309 Allergic rhinitis, unspecified: Secondary | ICD-10-CM

## 2013-02-14 DIAGNOSIS — I10 Essential (primary) hypertension: Secondary | ICD-10-CM | POA: Diagnosis present

## 2013-02-14 DIAGNOSIS — Z79899 Other long term (current) drug therapy: Secondary | ICD-10-CM

## 2013-02-14 DIAGNOSIS — T391X5A Adverse effect of 4-Aminophenol derivatives, initial encounter: Secondary | ICD-10-CM | POA: Diagnosis present

## 2013-02-14 DIAGNOSIS — E781 Pure hyperglyceridemia: Secondary | ICD-10-CM

## 2013-02-14 DIAGNOSIS — Z6825 Body mass index (BMI) 25.0-25.9, adult: Secondary | ICD-10-CM

## 2013-02-14 DIAGNOSIS — IMO0002 Reserved for concepts with insufficient information to code with codable children: Secondary | ICD-10-CM

## 2013-02-14 DIAGNOSIS — K760 Fatty (change of) liver, not elsewhere classified: Secondary | ICD-10-CM

## 2013-02-14 LAB — CBC WITH DIFFERENTIAL/PLATELET
Basophils Absolute: 0.1 K/uL (ref 0.0–0.1)
Basophils Relative: 1 % (ref 0–1)
Eosinophils Absolute: 0.1 K/uL (ref 0.0–0.7)
Eosinophils Relative: 2 % (ref 0–5)
HCT: 40.4 % (ref 39.0–52.0)
Hemoglobin: 13.8 g/dL (ref 13.0–17.0)
Lymphocytes Relative: 54 % — ABNORMAL HIGH (ref 12–46)
Lymphs Abs: 2.7 K/uL (ref 0.7–4.0)
MCH: 32.9 pg (ref 26.0–34.0)
MCHC: 34.2 g/dL (ref 30.0–36.0)
MCV: 96.2 fL (ref 78.0–100.0)
Monocytes Absolute: 0.5 K/uL (ref 0.1–1.0)
Monocytes Relative: 10 % (ref 3–12)
Neutro Abs: 1.7 K/uL (ref 1.7–7.7)
Neutrophils Relative %: 33 % — ABNORMAL LOW (ref 43–77)
Platelets: 352 K/uL (ref 150–400)
RBC: 4.2 MIL/uL — ABNORMAL LOW (ref 4.22–5.81)
RDW: 14.1 % (ref 11.5–15.5)
WBC: 5 K/uL (ref 4.0–10.5)

## 2013-02-14 LAB — BASIC METABOLIC PANEL WITH GFR
BUN: 20 mg/dL (ref 6–23)
CO2: 21 meq/L (ref 19–32)
Calcium: 8.7 mg/dL (ref 8.4–10.5)
Chloride: 108 meq/L (ref 96–112)
Creatinine, Ser: 0.8 mg/dL (ref 0.50–1.35)
GFR calc Af Amer: >90 mL/min
GFR calc non Af Amer: >90 mL/min
Glucose, Bld: 87 mg/dL (ref 70–99)
Potassium: 4 meq/L (ref 3.5–5.1)
Sodium: 124 meq/L — ABNORMAL LOW (ref 135–145)

## 2013-02-14 LAB — ETHANOL: Alcohol, Ethyl (B): 407 mg/dL (ref 0–11)

## 2013-02-14 MED ORDER — SODIUM CHLORIDE 0.9 % IV SOLN
1000.0000 mL | Freq: Once | INTRAVENOUS | Status: AC
  Administered 2013-02-14: 1000 mL via INTRAVENOUS
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
## 2013-02-14 NOTE — ED Notes (Signed)
Pt continues to sleep, slight snooring noted, NAD, Vitals wdl

## 2013-02-14 NOTE — ED Notes (Signed)
Dr Orvan Falconer still in room, will call report when he is out

## 2013-02-14 NOTE — ED Notes (Addendum)
Critical Alchohol of 407 called by LAB, Dr Estell Harpin notified in person

## 2013-02-14 NOTE — ED Notes (Signed)
Lab contacted, [employer] states they need an ethanol drawn.
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
## 2013-02-15 ENCOUNTER — Encounter (HOSPITAL_COMMUNITY): Payer: Self-pay | Admitting: *Deleted

## 2013-02-15 ENCOUNTER — Inpatient Hospital Stay (HOSPITAL_COMMUNITY): Payer: Medicare Other

## 2013-02-15 DIAGNOSIS — M549 Dorsalgia, unspecified: Secondary | ICD-10-CM

## 2013-02-15 DIAGNOSIS — G8929 Other chronic pain: Secondary | ICD-10-CM

## 2013-02-15 DIAGNOSIS — H5789 Other specified disorders of eye and adnexa: Secondary | ICD-10-CM

## 2013-02-15 DIAGNOSIS — S6991XA Unspecified injury of right wrist, hand and finger(s), initial encounter: Secondary | ICD-10-CM

## 2013-02-15 DIAGNOSIS — S93409A Sprain of unspecified ligament of unspecified ankle, initial encounter: Secondary | ICD-10-CM | POA: Diagnosis present

## 2013-02-15 LAB — COMPREHENSIVE METABOLIC PANEL
ALT: 15 U/L (ref 0–53)
AST: 39 U/L — ABNORMAL HIGH (ref 0–37)
Albumin: 3.5 g/dL (ref 3.5–5.2)
Alkaline Phosphatase: 43 U/L (ref 39–117)
BUN: 18 mg/dL (ref 6–23)
CO2: 27 mEq/L (ref 19–32)
Calcium: 8.3 mg/dL — ABNORMAL LOW (ref 8.4–10.5)
Chloride: 106 mEq/L (ref 96–112)
Creatinine, Ser: 0.84 mg/dL (ref 0.50–1.35)
GFR calc Af Amer: >90 mL/min (ref >90)
GFR calc non Af Amer: >90 mL/min (ref >90)
Glucose, Bld: 75 mg/dL (ref 70–99)
Potassium: 3.9 mEq/L (ref 3.5–5.1)
Sodium: 142 mEq/L (ref 135–145)
Total Bilirubin: 0.4 mg/dL (ref 0.3–1.2)
Total Protein: 6.7 g/dL (ref 6.0–8.3)

## 2013-02-15 LAB — PROTIME-INR
INR: 0.97 (ref 0.00–1.49)
Prothrombin Time: 12.8 seconds (ref 11.6–15.2)

## 2013-02-15 LAB — URINALYSIS, ROUTINE W REFLEX MICROSCOPIC
Bilirubin Urine: NEGATIVE
Glucose, UA: NEGATIVE mg/dL
Hgb urine dipstick: NEGATIVE
Ketones, ur: NEGATIVE mg/dL
Leukocytes, UA: NEGATIVE
Nitrite: NEGATIVE
Protein, ur: NEGATIVE mg/dL
Specific Gravity, Urine: 1.01 (ref 1.005–1.030)
Urobilinogen, UA: 0.2 mg/dL (ref 0.0–1.0)
pH: 6 (ref 5.0–8.0)

## 2013-02-15 LAB — CBC
HCT: 39.4 % (ref 39.0–52.0)
Hemoglobin: 13.6 g/dL (ref 13.0–17.0)
MCH: 33.2 pg (ref 26.0–34.0)
MCHC: 34.5 g/dL (ref 30.0–36.0)
MCV: 96.1 fL (ref 78.0–100.0)
Platelets: 343 10*3/uL (ref 150–400)
RBC: 4.1 MIL/uL — ABNORMAL LOW (ref 4.22–5.81)
RDW: 14.3 % (ref 11.5–15.5)
WBC: 4.8 10*3/uL (ref 4.0–10.5)

## 2013-02-15 LAB — RAPID URINE DRUG SCREEN, HOSP PERFORMED
Amphetamines: NOT DETECTED
Barbiturates: NOT DETECTED
Benzodiazepines: NOT DETECTED
Cocaine: POSITIVE — AB
Opiates: NOT DETECTED
Tetrahydrocannabinol: NOT DETECTED

## 2013-02-15 LAB — MAGNESIUM: Magnesium: 2.1 mg/dL (ref 1.5–2.5)

## 2013-02-15 LAB — APTT: aPTT: 31 seconds (ref 24–37)

## 2013-02-15 LAB — HEMOGLOBIN A1C
Hgb A1c MFr Bld: 5.6 % (ref <5.7)
Mean Plasma Glucose: 114 mg/dL (ref <117)

## 2013-02-15 LAB — LIPASE, BLOOD: Lipase: 19 U/L (ref 11–59)

## 2013-02-15 MED ORDER — ALBUTEROL SULFATE HFA 108 (90 BASE) MCG/ACT IN AERS: 2.0000 | INHALATION_SPRAY | RESPIRATORY_TRACT | Status: DC | PRN

## 2013-02-15 MED ORDER — ONDANSETRON 4 MG PO TBDP
4.0000 mg | ORAL_TABLET | Freq: Once | ORAL | Status: AC
  Administered 2013-02-15: 4 mg via ORAL
  Filled 2013-02-15: qty 1

## 2013-02-15 MED ORDER — POTASSIUM CHLORIDE IN NACL 20-0.9 MEQ/L-% IV SOLN: INTRAVENOUS | Status: DC

## 2013-02-15 MED ORDER — THIAMINE HCL 100 MG/ML IJ SOLN
INTRAMUSCULAR | Status: AC
  Filled 2013-02-15: qty 2

## 2013-02-15 MED ORDER — SODIUM CHLORIDE 0.9 % IJ SOLN
3.0000 mL | Freq: Two times a day (BID) | INTRAMUSCULAR | Status: DC
  Administered 2013-02-15: 3 mL via INTRAVENOUS
  Filled 2013-02-15: qty 3

## 2013-02-15 MED ORDER — HYDROCODONE-ACETAMINOPHEN 5-325 MG PO TABS: 1.0000 | ORAL_TABLET | Freq: Four times a day (QID) | ORAL | Status: DC | PRN

## 2013-02-15 MED ORDER — FOLIC ACID 5 MG/ML IJ SOLN
INTRAMUSCULAR | Status: AC
  Filled 2013-02-15: qty 0.2

## 2013-02-15 MED ORDER — ALBUTEROL SULFATE HFA 108 (90 BASE) MCG/ACT IN AERS: 2.0000 | INHALATION_SPRAY | Freq: Four times a day (QID) | RESPIRATORY_TRACT | Status: DC | PRN

## 2013-02-15 MED ORDER — M.V.I. ADULT IV INJ
INJECTION | INTRAVENOUS | Status: AC
  Filled 2013-02-15: qty 10

## 2013-02-15 MED ORDER — OXYCODONE-ACETAMINOPHEN 5-325 MG PO TABS: 1.0000 | ORAL_TABLET | Freq: Four times a day (QID) | ORAL | Status: DC | PRN

## 2013-02-15 MED ORDER — HYDROCODONE-ACETAMINOPHEN 5-325 MG PO TABS
1.0000 | ORAL_TABLET | ORAL | Status: DC | PRN
  Administered 2013-02-15: 1 via ORAL
  Filled 2013-02-15: qty 1

## 2013-02-15 MED ORDER — OXYCODONE-ACETAMINOPHEN 5-325 MG PO TABS
1.0000 | ORAL_TABLET | Freq: Four times a day (QID) | ORAL | Status: DC | PRN
  Administered 2013-02-15: 2 via ORAL
  Filled 2013-02-15: qty 2

## 2013-02-15 MED ORDER — THIAMINE HCL 100 MG/ML IJ SOLN
Freq: Once | INTRAVENOUS | Status: AC
  Administered 2013-02-15: 03:00:00 via INTRAVENOUS
  Filled 2013-02-15: qty 1000

## 2013-02-15 MED ORDER — PROMETHAZINE HCL 12.5 MG PO TABS: 12.5000 mg | ORAL_TABLET | Freq: Four times a day (QID) | ORAL | Status: DC | PRN

## 2013-02-15 NOTE — Progress Notes (Signed)
We have received a request for a PT consult to determine if the pt needs crutches due to an ankle injury.  Pt has been observed by me walking independently in the hallway with no instability.  I do not see a need for crutches.  No eval will be done.
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
## 2013-02-15 NOTE — Progress Notes (Signed)
Pt complains of perspiration and vomiting. Dr. Sherrie Mustache made aware. New one time order of ODT Zofran and Abdominal X-ray ordered as well as a Lipase level. Will continue to monitor.

## 2013-02-15 NOTE — Progress Notes (Signed)
Pt discharged home today per Dr. Sherrie Mustache. Pt's IV site D/C'd and WNL. Pt's VS stable at this time. Pt provided with home medication list, discharge instructions and prescriptions. Verbalized understanding. Pt made aware of the importance of keeping follow up appointments already in place and calling [facility] after discharge. Verbalized understanding. Pt verbalized understanding. Pt left floor via WC in stable condition accompanied by RN.
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
## 2013-02-15 NOTE — Progress Notes (Signed)
UR Chart Review Completed  

## 2013-02-15 NOTE — Progress Notes (Signed)
Patient asked for cell phone.  Was not on patient's belongings when he arrived to floor. Patient only had clothing and shoes. Emergency room said phone was not in their area.

## 2013-02-15 NOTE — Progress Notes (Signed)
Patient arrived to floor with swollen right eye, and laceration to left upper arm.  Splint to right arm and ace wrap to left leg.  Patient has abrasions to face.

## 2013-02-17 ENCOUNTER — Other Ambulatory Visit (HOSPITAL_COMMUNITY): Payer: Self-pay | Admitting: Family Medicine

## 2013-02-17 DIAGNOSIS — M79641 Pain in right hand: Secondary | ICD-10-CM

## 2013-02-22 ENCOUNTER — Ambulatory Visit (HOSPITAL_COMMUNITY)
Admission: RE | Admit: 2013-02-22 | Discharge: 2013-02-22 | Disposition: A | Discharge status: Home / Self Care | Payer: Medicare Other | Source: Ambulatory Visit | Attending: Family Medicine | Admitting: Family Medicine

## 2013-02-22 DIAGNOSIS — M79641 Pain in right hand: Secondary | ICD-10-CM

## 2013-02-23 ENCOUNTER — Encounter (HOSPITAL_COMMUNITY): Payer: Self-pay

## 2013-02-23 ENCOUNTER — Ambulatory Visit (HOSPITAL_COMMUNITY)
Admission: RE | Admit: 2013-02-23 | Discharge: 2013-02-23 | Disposition: A | Discharge status: Home / Self Care | Payer: Medicare Other | Source: Ambulatory Visit | Attending: Family Medicine | Admitting: Family Medicine

## 2013-02-23 DIAGNOSIS — R937 Abnormal findings on diagnostic imaging of other parts of musculoskeletal system: Secondary | ICD-10-CM | POA: Insufficient documentation

## 2013-02-23 DIAGNOSIS — M25549 Pain in joints of unspecified hand: Secondary | ICD-10-CM | POA: Insufficient documentation

## 2013-02-23 DIAGNOSIS — S6990XA Unspecified injury of unspecified wrist, hand and finger(s), initial encounter: Secondary | ICD-10-CM | POA: Insufficient documentation

## 2013-02-23 DIAGNOSIS — M6740 Ganglion, unspecified site: Secondary | ICD-10-CM | POA: Insufficient documentation

## 2013-02-23 DIAGNOSIS — M19049 Primary osteoarthritis, unspecified hand: Secondary | ICD-10-CM | POA: Insufficient documentation

## 2013-02-24 ENCOUNTER — Emergency Department (HOSPITAL_COMMUNITY)
Admission: EM | Admit: 2013-02-24 | Discharge: 2013-02-24 | Disposition: A | Discharge status: Home / Self Care | Payer: Medicare Other | Attending: Emergency Medicine | Admitting: Emergency Medicine

## 2013-02-24 ENCOUNTER — Encounter (HOSPITAL_COMMUNITY): Payer: Self-pay | Admitting: Emergency Medicine

## 2013-02-24 DIAGNOSIS — G8929 Other chronic pain: Secondary | ICD-10-CM | POA: Insufficient documentation

## 2013-02-24 DIAGNOSIS — I10 Essential (primary) hypertension: Secondary | ICD-10-CM | POA: Insufficient documentation

## 2013-02-24 DIAGNOSIS — Z79899 Other long term (current) drug therapy: Secondary | ICD-10-CM | POA: Insufficient documentation

## 2013-02-24 DIAGNOSIS — Z8639 Personal history of other endocrine, nutritional and metabolic disease: Secondary | ICD-10-CM | POA: Insufficient documentation

## 2013-02-24 DIAGNOSIS — Z862 Personal history of diseases of the blood and blood-forming organs and certain disorders involving the immune mechanism: Secondary | ICD-10-CM | POA: Insufficient documentation

## 2013-02-24 DIAGNOSIS — F1010 Alcohol abuse, uncomplicated: Secondary | ICD-10-CM | POA: Insufficient documentation

## 2013-02-24 DIAGNOSIS — M5430 Sciatica, unspecified side: Secondary | ICD-10-CM | POA: Insufficient documentation

## 2013-02-24 DIAGNOSIS — M2550 Pain in unspecified joint: Secondary | ICD-10-CM | POA: Insufficient documentation

## 2013-02-24 DIAGNOSIS — K219 Gastro-esophageal reflux disease without esophagitis: Secondary | ICD-10-CM | POA: Insufficient documentation

## 2013-02-24 DIAGNOSIS — Y929 Unspecified place or not applicable: Secondary | ICD-10-CM | POA: Insufficient documentation

## 2013-02-24 DIAGNOSIS — S99929A Unspecified injury of unspecified foot, initial encounter: Secondary | ICD-10-CM | POA: Insufficient documentation

## 2013-02-24 DIAGNOSIS — S8990XA Unspecified injury of unspecified lower leg, initial encounter: Secondary | ICD-10-CM | POA: Insufficient documentation

## 2013-02-24 DIAGNOSIS — M79672 Pain in left foot: Secondary | ICD-10-CM

## 2013-02-24 DIAGNOSIS — F319 Bipolar disorder, unspecified: Secondary | ICD-10-CM | POA: Insufficient documentation

## 2013-02-24 DIAGNOSIS — W010XXA Fall on same level from slipping, tripping and stumbling without subsequent striking against object, initial encounter: Secondary | ICD-10-CM | POA: Insufficient documentation

## 2013-02-24 DIAGNOSIS — Z8709 Personal history of other diseases of the respiratory system: Secondary | ICD-10-CM | POA: Insufficient documentation

## 2013-02-24 DIAGNOSIS — S0993XA Unspecified injury of face, initial encounter: Secondary | ICD-10-CM | POA: Insufficient documentation

## 2013-02-24 DIAGNOSIS — Z8781 Personal history of (healed) traumatic fracture: Secondary | ICD-10-CM | POA: Insufficient documentation

## 2013-02-24 DIAGNOSIS — E785 Hyperlipidemia, unspecified: Secondary | ICD-10-CM | POA: Insufficient documentation

## 2013-02-24 DIAGNOSIS — IMO0002 Reserved for concepts with insufficient information to code with codable children: Secondary | ICD-10-CM | POA: Insufficient documentation

## 2013-02-24 DIAGNOSIS — M5442 Lumbago with sciatica, left side: Secondary | ICD-10-CM

## 2013-02-24 DIAGNOSIS — Z8719 Personal history of other diseases of the digestive system: Secondary | ICD-10-CM | POA: Insufficient documentation

## 2013-02-24 DIAGNOSIS — Y939 Activity, unspecified: Secondary | ICD-10-CM | POA: Insufficient documentation

## 2013-02-24 MED ORDER — DEXAMETHASONE SODIUM PHOSPHATE 4 MG/ML IJ SOLN
8.0000 mg | Freq: Once | INTRAMUSCULAR | Status: AC
  Administered 2013-02-24: 8 mg via INTRAMUSCULAR
  Filled 2013-02-24: qty 2

## 2013-02-24 MED ORDER — KETOROLAC TROMETHAMINE 30 MG/ML IJ SOLN
30.0000 mg | Freq: Once | INTRAMUSCULAR | Status: AC
  Administered 2013-02-24: 30 mg via INTRAMUSCULAR
  Filled 2013-02-24: qty 1

## 2013-02-24 NOTE — ED Notes (Signed)
Patient on phone calling his brother at present

## 2013-02-24 NOTE — ED Notes (Signed)
Patient up to bedside to void. Patient able to stand on own and void independently. Gave patient the telephone to call his brother. No other needs voiced at this time.
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
## 2013-02-24 NOTE — ED Notes (Signed)
Patient states he fell yesterday, slipped on ice, injured his back.  Pain on left lower back and into left leg

## 2013-03-03 ENCOUNTER — Encounter (HOSPITAL_COMMUNITY): Payer: Self-pay

## 2013-03-03 ENCOUNTER — Emergency Department (HOSPITAL_COMMUNITY)
Admission: EM | Admit: 2013-03-03 | Discharge: 2013-03-03 | Disposition: A | Discharge status: Home / Self Care | Payer: Medicare Other | Attending: Emergency Medicine | Admitting: Emergency Medicine

## 2013-03-03 DIAGNOSIS — Z862 Personal history of diseases of the blood and blood-forming organs and certain disorders involving the immune mechanism: Secondary | ICD-10-CM | POA: Insufficient documentation

## 2013-03-03 DIAGNOSIS — M25559 Pain in unspecified hip: Secondary | ICD-10-CM | POA: Insufficient documentation

## 2013-03-03 DIAGNOSIS — I10 Essential (primary) hypertension: Secondary | ICD-10-CM | POA: Insufficient documentation

## 2013-03-03 DIAGNOSIS — Z8719 Personal history of other diseases of the digestive system: Secondary | ICD-10-CM | POA: Insufficient documentation

## 2013-03-03 DIAGNOSIS — K219 Gastro-esophageal reflux disease without esophagitis: Secondary | ICD-10-CM | POA: Insufficient documentation

## 2013-03-03 DIAGNOSIS — Z8639 Personal history of other endocrine, nutritional and metabolic disease: Secondary | ICD-10-CM | POA: Insufficient documentation

## 2013-03-03 DIAGNOSIS — G8929 Other chronic pain: Secondary | ICD-10-CM | POA: Insufficient documentation

## 2013-03-03 DIAGNOSIS — Z8739 Personal history of other diseases of the musculoskeletal system and connective tissue: Secondary | ICD-10-CM | POA: Insufficient documentation

## 2013-03-03 DIAGNOSIS — F319 Bipolar disorder, unspecified: Secondary | ICD-10-CM | POA: Insufficient documentation

## 2013-03-03 DIAGNOSIS — M25552 Pain in left hip: Secondary | ICD-10-CM

## 2013-03-03 DIAGNOSIS — F17200 Nicotine dependence, unspecified, uncomplicated: Secondary | ICD-10-CM | POA: Insufficient documentation

## 2013-03-03 DIAGNOSIS — M545 Low back pain, unspecified: Secondary | ICD-10-CM | POA: Insufficient documentation

## 2013-03-03 DIAGNOSIS — Z79899 Other long term (current) drug therapy: Secondary | ICD-10-CM | POA: Insufficient documentation

## 2013-03-03 DIAGNOSIS — E785 Hyperlipidemia, unspecified: Secondary | ICD-10-CM | POA: Insufficient documentation

## 2013-03-03 DIAGNOSIS — Z8781 Personal history of (healed) traumatic fracture: Secondary | ICD-10-CM | POA: Insufficient documentation

## 2013-03-03 MED ORDER — KETOROLAC TROMETHAMINE 60 MG/2ML IM SOLN
60.0000 mg | Freq: Once | INTRAMUSCULAR | Status: AC
  Administered 2013-03-03: 60 mg via INTRAMUSCULAR
  Filled 2013-03-03: qty 2

## 2013-03-03 NOTE — ED Provider Notes (Signed)
Medical screening examination/treatment/procedure(s) were performed by non-physician practitioner and as supervising physician I was immediately available for consultation/collaboration.   Joya Gaskins, MD 03/03/13 [phone]

## 2013-03-03 NOTE — ED Notes (Signed)
Pa bryant to see pt.

## 2013-03-03 NOTE — ED Provider Notes (Signed)
History     CSN: [id number]  Arrival date & time 03/03/13  1345   First MD Initiated Contact with Patient 03/03/13 1429      Chief Complaint  Patient presents with  . Back Pain    (Consider location/radiation/quality/duration/timing/severity/associated sxs/prior treatment) Patient is a 52 y.o. male presenting with back pain. The history is provided by the patient.  Back Pain Location:  Lumbar spine Quality:  Aching Radiates to: left hip. Pain severity:  Moderate Pain is:  Same all the time Onset quality:  Gradual Timing:  Intermittent Progression:  Worsening Chronicity:  Chronic Context comment:  Old back injury Relieved by:  Nothing Worsened by:  Bending (certain movement.) Ineffective treatments:  None tried Associated symptoms: no abdominal pain, no bladder incontinence, no bowel incontinence, no chest pain, no dysuria and no numbness     Past Medical History  Diagnosis Date  . Bronchitis   . Acid reflux   . Pancreatitis     March 2013  . Gout   . Bipolar disorder   . Hyperlipidemia   . Alcohol abuse     6-8 cans of beer daily  . Pseudocyst of pancreas 02/28/2012  . Arthritis   . Fracture of lower leg   . Hypertension   . Chronic pain   . Chronic neck pain   . Chronic back pain   . Left arm pain     chronic    Past Surgical History  Procedure Laterality Date  . Nose surgery      broken nose  . Circumcision  03/17/2012    Procedure: CIRCUMCISION ADULT;  Surgeon: Ky Barban, MD;  Location: [facility];  Service: Urology;  Laterality: N/A;  . Colonoscopy with propofol  12/24/2012    Procedure: COLONOSCOPY WITH PROPOFOL;  Surgeon: Corbin Ade, MD;  Location: [facility];  Service: Endoscopy;  Laterality: N/A;  started at 0803, in cecum at 0812, withdrawel time Biopsy of anal lesion  . Esophagogastroduodenoscopy (egd) with propofol  12/24/2012    Procedure: ESOPHAGOGASTRODUODENOSCOPY (EGD) WITH PROPOFOL;  Surgeon: Corbin Ade, MD;  Location: [facility];   Service: Endoscopy;  Laterality: N/A;  done at 0800    Family History  Problem Relation Age of Onset  . Diabetes Father   . Anesthesia problems Neg Hx   . Hypotension Neg Hx   . Malignant hyperthermia Neg Hx   . Pseudochol deficiency Neg Hx   . Colon cancer Neg Hx     History  Substance Use Topics  . Smoking status: Current Every Day Smoker -- 0.50 packs/day for 25 years    Types: Cigarettes  . Smokeless tobacco: Not on file  . Alcohol Use: 0.0 oz/week     Comment: 6-8 beers a day      Review of Systems  Constitutional: Negative for activity change.       All ROS Neg except as noted in HPI  HENT: Negative for nosebleeds and neck pain.   Eyes: Negative for photophobia and discharge.  Respiratory: Positive for cough. Negative for shortness of breath and wheezing.   Cardiovascular: Negative for chest pain and palpitations.  Gastrointestinal: Negative for abdominal pain, blood in stool and bowel incontinence.  Genitourinary: Negative for bladder incontinence, dysuria, frequency and hematuria.  Musculoskeletal: Positive for back pain and arthralgias.  Skin: Negative.   Neurological: Negative for dizziness, seizures, speech difficulty and numbness.  Psychiatric/Behavioral: Negative for hallucinations and confusion.    Allergies  Penicillins  Home Medications  Current Outpatient Rx  Name  Route  Sig  Dispense  Refill  . albuterol (PROVENTIL HFA;VENTOLIN HFA) 108 (90 BASE) MCG/ACT inhaler   Inhalation   Inhale 2 puffs into the lungs every 4 (four) hours as needed for wheezing.   1 Inhaler   2   . citalopram (CELEXA) 10 MG tablet   Oral   Take 10 mg by mouth daily.         . fish oil-omega-3 fatty acids 1000 MG capsule   Oral   Take 1 g by mouth daily.          Marland Kitchen gemfibrozil (LOPID) 600 MG tablet   Oral   Take 1 tablet (600 mg total) by mouth 2 (two) times daily before a meal.   60 tablet   3   . guaiFENesin (MUCINEX) 600 MG 12 hr tablet   Oral   Take  600 mg by mouth 2 (two) times daily.         Marland Kitchen HYDROcodone-acetaminophen (NORCO/VICODIN) 5-325 MG per tablet   Oral   Take 1-2 tablets by mouth every 6 (six) hours as needed for pain.   15 tablet   0   . Multiple Vitamin (MULTIVITAMIN WITH MINERALS) TABS   Oral   Take 1 tablet by mouth daily.         Marland Kitchen omeprazole (PRILOSEC) 20 MG capsule   Oral   Take 20 mg by mouth daily.         . promethazine (PHENERGAN) 12.5 MG tablet   Oral   Take 1 tablet (12.5 mg total) by mouth every 6 (six) hours as needed for nausea.   20 tablet   0   . Tamsulosin HCl (FLOMAX) 0.4 MG CAPS   Oral   Take 1 capsule (0.4 mg total) by mouth daily.   30 capsule   6   . traZODone (DESYREL) 100 MG tablet   Oral   Take 100 mg by mouth at bedtime as needed. Sleep           BP 133/87  Pulse 84  Temp(Src) 97.6 F (36.4 C) (Oral)  Resp 18  Ht 5\' 9"  (1.753 m)  Wt 187 lb (84.823 kg)  BMI 27.6 kg/m2  SpO2 98%  Physical Exam  Nursing note and vitals reviewed. Constitutional: He is oriented to person, place, and time. He appears well-developed and well-nourished.  Non-toxic appearance.  HENT:  Head: Normocephalic.  Right Ear: Tympanic membrane and external ear normal.  Left Ear: Tympanic membrane and external ear normal.  Eyes: EOM and lids are normal. Pupils are equal, round, and reactive to light.  Neck: Normal range of motion. Neck supple. Carotid bruit is not present.  Soreness with range of motion. No palpable step off.  Cardiovascular: Normal rate, regular rhythm, normal heart sounds, intact distal pulses and normal pulses.   Pulmonary/Chest: Breath sounds normal. No respiratory distress.  Abdominal: Soft. Bowel sounds are normal. There is no tenderness. There is no guarding.  Musculoskeletal: Normal range of motion.  Paraspinal tenderness of the lumbar area. Left greater than right. No hot areas appreciated. No palpable step off appreciated.  Lymphadenopathy:       Head (right side):  No submandibular adenopathy present.       Head (left side): No submandibular adenopathy present.    He has no cervical adenopathy.  Neurological: He is alert and oriented to person, place, and time. He has normal strength. No cranial nerve deficit or sensory deficit.  Skin: Skin is warm and dry.  Psychiatric: He has a normal mood and affect. His speech is normal.    ED Course  Procedures (including critical care time)  Labs Reviewed - No data to display No results found.   1. Back pain, chronic   2. Hip pain, left       MDM  I have reviewed nursing notes, vital signs, and all appropriate lab and imaging results for this patient. This is one of several visits for chronic back pain for this patient. The patient has history of chronic pain multiple sites. He also has gout in degenerative joint disease.  No gross neurologic deficits appreciated.  Patient is treated with intramuscular Decadron and Toradol. Patient is to follow with his primary physician for additional management of this problem.       Kathie Dike, PA-C 03/03/13 [phone]

## 2013-03-03 NOTE — ED Notes (Signed)
Pt reports back pain for years, denies any new or recent injury, also having left hip pain which is chronic

## 2013-03-03 NOTE — ED Notes (Signed)
PT reports has chronic lower back and left hip pain.

## 2013-03-06 ENCOUNTER — Other Ambulatory Visit: Payer: Self-pay | Admitting: Family Medicine

## 2013-03-08 ENCOUNTER — Telehealth: Payer: Self-pay | Admitting: Family Medicine

## 2013-03-08 MED ORDER — OMEPRAZOLE 20 MG PO CPDR: 20.0000 mg | DELAYED_RELEASE_CAPSULE | Freq: Every day | ORAL | Status: DC

## 2013-03-08 NOTE — Telephone Encounter (Signed)
Sent in

## 2013-03-12 ENCOUNTER — Encounter (HOSPITAL_COMMUNITY): Payer: Self-pay | Admitting: Emergency Medicine

## 2013-03-12 ENCOUNTER — Emergency Department (HOSPITAL_COMMUNITY)
Admission: EM | Admit: 2013-03-12 | Discharge: 2013-03-12 | Disposition: A | Discharge status: Other Acute Inpatient Hospital | Payer: Medicare Other | Attending: Emergency Medicine | Admitting: Emergency Medicine

## 2013-03-12 ENCOUNTER — Encounter (HOSPITAL_COMMUNITY): Payer: Self-pay | Admitting: Behavioral Health

## 2013-03-12 ENCOUNTER — Inpatient Hospital Stay (HOSPITAL_COMMUNITY)
Admission: AD | Admit: 2013-03-12 | Discharge: 2013-03-19 | DRG: 897 | Disposition: A | Discharge status: Home / Self Care | Payer: Medicare Other | Attending: Psychiatry | Admitting: Psychiatry

## 2013-03-12 DIAGNOSIS — M549 Dorsalgia, unspecified: Secondary | ICD-10-CM | POA: Insufficient documentation

## 2013-03-12 DIAGNOSIS — I10 Essential (primary) hypertension: Secondary | ICD-10-CM | POA: Diagnosis present

## 2013-03-12 DIAGNOSIS — F1020 Alcohol dependence, uncomplicated: Secondary | ICD-10-CM | POA: Diagnosis present

## 2013-03-12 DIAGNOSIS — Z79899 Other long term (current) drug therapy: Secondary | ICD-10-CM | POA: Insufficient documentation

## 2013-03-12 DIAGNOSIS — Z8709 Personal history of other diseases of the respiratory system: Secondary | ICD-10-CM | POA: Insufficient documentation

## 2013-03-12 DIAGNOSIS — Z8739 Personal history of other diseases of the musculoskeletal system and connective tissue: Secondary | ICD-10-CM | POA: Insufficient documentation

## 2013-03-12 DIAGNOSIS — F10239 Alcohol dependence with withdrawal, unspecified: Principal | ICD-10-CM | POA: Diagnosis present

## 2013-03-12 DIAGNOSIS — Z8719 Personal history of other diseases of the digestive system: Secondary | ICD-10-CM | POA: Insufficient documentation

## 2013-03-12 DIAGNOSIS — G8929 Other chronic pain: Secondary | ICD-10-CM | POA: Insufficient documentation

## 2013-03-12 DIAGNOSIS — F17200 Nicotine dependence, unspecified, uncomplicated: Secondary | ICD-10-CM | POA: Insufficient documentation

## 2013-03-12 DIAGNOSIS — F319 Bipolar disorder, unspecified: Secondary | ICD-10-CM | POA: Insufficient documentation

## 2013-03-12 DIAGNOSIS — F1910 Other psychoactive substance abuse, uncomplicated: Secondary | ICD-10-CM

## 2013-03-12 DIAGNOSIS — Z8781 Personal history of (healed) traumatic fracture: Secondary | ICD-10-CM | POA: Insufficient documentation

## 2013-03-12 DIAGNOSIS — F10929 Alcohol use, unspecified with intoxication, unspecified: Secondary | ICD-10-CM | POA: Diagnosis present

## 2013-03-12 DIAGNOSIS — M542 Cervicalgia: Secondary | ICD-10-CM | POA: Insufficient documentation

## 2013-03-12 DIAGNOSIS — F1410 Cocaine abuse, uncomplicated: Secondary | ICD-10-CM | POA: Diagnosis present

## 2013-03-12 DIAGNOSIS — F1010 Alcohol abuse, uncomplicated: Secondary | ICD-10-CM | POA: Diagnosis present

## 2013-03-12 DIAGNOSIS — F10939 Alcohol use, unspecified with withdrawal, unspecified: Principal | ICD-10-CM | POA: Diagnosis present

## 2013-03-12 DIAGNOSIS — F1590 Other stimulant use, unspecified, uncomplicated: Secondary | ICD-10-CM | POA: Diagnosis present

## 2013-03-12 DIAGNOSIS — Z9189 Other specified personal risk factors, not elsewhere classified: Secondary | ICD-10-CM | POA: Diagnosis present

## 2013-03-12 DIAGNOSIS — K219 Gastro-esophageal reflux disease without esophagitis: Secondary | ICD-10-CM | POA: Insufficient documentation

## 2013-03-12 LAB — URINE MICROSCOPIC-ADD ON

## 2013-03-12 LAB — CBC WITH DIFFERENTIAL/PLATELET
Basophils Absolute: 0.1 10*3/uL (ref 0.0–0.1)
Basophils Relative: 2 % — ABNORMAL HIGH (ref 0–1)
Eosinophils Absolute: 0.1 K/uL (ref 0.0–0.7)
Eosinophils Relative: 2 % (ref 0–5)
HCT: 39.5 % (ref 39.0–52.0)
Hemoglobin: 13.6 g/dL (ref 13.0–17.0)
Lymphocytes Relative: 51 % — ABNORMAL HIGH (ref 12–46)
Lymphs Abs: 1.8 10*3/uL (ref 0.7–4.0)
MCH: 33.2 pg (ref 26.0–34.0)
MCHC: 34.4 g/dL (ref 30.0–36.0)
MCV: 96.3 fL (ref 78.0–100.0)
Monocytes Absolute: 0.2 10*3/uL (ref 0.1–1.0)
Monocytes Relative: 4 % (ref 3–12)
Neutro Abs: 1.5 10*3/uL — ABNORMAL LOW (ref 1.7–7.7)
Neutrophils Relative %: 41 % — ABNORMAL LOW (ref 43–77)
Platelets: 256 10*3/uL (ref 150–400)
RBC: 4.1 MIL/uL — ABNORMAL LOW (ref 4.22–5.81)
RDW: 14.8 % (ref 11.5–15.5)
WBC: 3.5 10*3/uL — ABNORMAL LOW (ref 4.0–10.5)

## 2013-03-12 LAB — COMPREHENSIVE METABOLIC PANEL
ALT: 16 U/L (ref 0–53)
AST: 71 U/L — ABNORMAL HIGH (ref 0–37)
Albumin: 3.3 g/dL — ABNORMAL LOW (ref 3.5–5.2)
Alkaline Phosphatase: 57 U/L (ref 39–117)
BUN: 12 mg/dL (ref 6–23)
CO2: 23 mEq/L (ref 19–32)
Calcium: 8.7 mg/dL (ref 8.4–10.5)
Chloride: 102 mEq/L (ref 96–112)
Creatinine, Ser: 0.93 mg/dL (ref 0.50–1.35)
GFR calc Af Amer: >90 mL/min (ref >90)
GFR calc non Af Amer: >90 mL/min (ref >90)
Glucose, Bld: 104 mg/dL — ABNORMAL HIGH (ref 70–99)
Potassium: 3.4 mEq/L — ABNORMAL LOW (ref 3.5–5.1)
Sodium: 140 mEq/L (ref 135–145)
Total Bilirubin: 0.6 mg/dL (ref 0.3–1.2)
Total Protein: 7 g/dL (ref 6.0–8.3)

## 2013-03-12 LAB — RAPID URINE DRUG SCREEN, HOSP PERFORMED
Amphetamines: NOT DETECTED
Barbiturates: NOT DETECTED
Benzodiazepines: NOT DETECTED
Cocaine: POSITIVE — AB
Opiates: POSITIVE — AB
Tetrahydrocannabinol: NOT DETECTED

## 2013-03-12 LAB — URINALYSIS, ROUTINE W REFLEX MICROSCOPIC
Bilirubin Urine: NEGATIVE
Glucose, UA: NEGATIVE mg/dL
Ketones, ur: NEGATIVE mg/dL
Leukocytes, UA: NEGATIVE
Nitrite: NEGATIVE
Protein, ur: NEGATIVE mg/dL
Specific Gravity, Urine: 1.031 — ABNORMAL HIGH (ref 1.005–1.030)
Urobilinogen, UA: 1 mg/dL (ref 0.0–1.0)
pH: 6 (ref 5.0–8.0)

## 2013-03-12 LAB — ETHANOL: Alcohol, Ethyl (B): 224 mg/dL — ABNORMAL HIGH (ref 0–11)

## 2013-03-12 MED ORDER — ALBUTEROL SULFATE HFA 108 (90 BASE) MCG/ACT IN AERS
2.0000 | INHALATION_SPRAY | RESPIRATORY_TRACT | Status: DC | PRN
  Administered 2013-03-17 – 2013-03-19 (×3): 2 via RESPIRATORY_TRACT
  Filled 2013-03-12 (×2): qty 6.7

## 2013-03-12 MED ORDER — ALUM & MAG HYDROXIDE-SIMETH 200-200-20 MG/5ML PO SUSP
30.0000 mL | ORAL | Status: DC | PRN
  Administered 2013-03-12 – 2013-03-16 (×3): 30 mL via ORAL

## 2013-03-12 MED ORDER — ADULT MULTIVITAMIN W/MINERALS CH
1.0000 | ORAL_TABLET | Freq: Every day | ORAL | Status: DC
  Administered 2013-03-12 – 2013-03-19 (×8): 1 via ORAL
  Filled 2013-03-12 (×10): qty 1

## 2013-03-12 MED ORDER — ONDANSETRON 4 MG PO TBDP: 4.0000 mg | ORAL_TABLET | Freq: Four times a day (QID) | ORAL | Status: AC | PRN

## 2013-03-12 MED ORDER — IBUPROFEN 800 MG PO TABS
800.0000 mg | ORAL_TABLET | Freq: Once | ORAL | Status: AC
  Administered 2013-03-12: 800 mg via ORAL
  Filled 2013-03-12: qty 1

## 2013-03-12 MED ORDER — OMEGA-3-ACID ETHYL ESTERS 1 G PO CAPS
1.0000 g | ORAL_CAPSULE | Freq: Every day | ORAL | Status: DC
  Administered 2013-03-12 – 2013-03-19 (×8): 1 g via ORAL
  Filled 2013-03-12 (×8): qty 1
  Filled 2013-03-12: qty 4
  Filled 2013-03-12 (×3): qty 1

## 2013-03-12 MED ORDER — CHLORDIAZEPOXIDE HCL 25 MG PO CAPS
25.0000 mg | ORAL_CAPSULE | Freq: Four times a day (QID) | ORAL | Status: AC | PRN
  Administered 2013-03-12 – 2013-03-14 (×2): 25 mg via ORAL
  Filled 2013-03-12 (×2): qty 1

## 2013-03-12 MED ORDER — GEMFIBROZIL 600 MG PO TABS
600.0000 mg | ORAL_TABLET | Freq: Two times a day (BID) | ORAL | Status: DC
  Administered 2013-03-13 – 2013-03-19 (×13): 600 mg via ORAL
  Filled 2013-03-12 (×11): qty 1
  Filled 2013-03-12: qty 8
  Filled 2013-03-12: qty 1
  Filled 2013-03-12: qty 8
  Filled 2013-03-12 (×4): qty 1

## 2013-03-12 MED ORDER — CITALOPRAM HYDROBROMIDE 10 MG PO TABS
10.0000 mg | ORAL_TABLET | Freq: Every day | ORAL | Status: DC
  Administered 2013-03-12 – 2013-03-19 (×8): 10 mg via ORAL
  Filled 2013-03-12 (×4): qty 1
  Filled 2013-03-12: qty 4
  Filled 2013-03-12 (×7): qty 1

## 2013-03-12 MED ORDER — VITAMIN B-1 100 MG PO TABS
100.0000 mg | ORAL_TABLET | Freq: Every day | ORAL | Status: DC
  Administered 2013-03-13 – 2013-03-19 (×7): 100 mg via ORAL
  Filled 2013-03-12 (×9): qty 1

## 2013-03-12 MED ORDER — CHLORDIAZEPOXIDE HCL 25 MG PO CAPS
25.0000 mg | ORAL_CAPSULE | Freq: Every day | ORAL | Status: AC
  Administered 2013-03-16: 25 mg via ORAL
  Filled 2013-03-12: qty 1

## 2013-03-12 MED ORDER — THIAMINE HCL 100 MG/ML IJ SOLN: 100.0000 mg | Freq: Once | INTRAMUSCULAR | Status: DC

## 2013-03-12 MED ORDER — TRAZODONE HCL 100 MG PO TABS
100.0000 mg | ORAL_TABLET | Freq: Every evening | ORAL | Status: DC | PRN
  Administered 2013-03-12 – 2013-03-18 (×6): 100 mg via ORAL
  Filled 2013-03-12 (×2): qty 1
  Filled 2013-03-12: qty 4
  Filled 2013-03-12 (×4): qty 1

## 2013-03-12 MED ORDER — CHLORDIAZEPOXIDE HCL 25 MG PO CAPS
25.0000 mg | ORAL_CAPSULE | Freq: Three times a day (TID) | ORAL | Status: AC
  Administered 2013-03-14 (×3): 25 mg via ORAL
  Filled 2013-03-12 (×3): qty 1

## 2013-03-12 MED ORDER — ADULT MULTIVITAMIN W/MINERALS CH: 1.0000 | ORAL_TABLET | Freq: Every day | ORAL | Status: DC

## 2013-03-12 MED ORDER — ACETAMINOPHEN 325 MG PO TABS
650.0000 mg | ORAL_TABLET | Freq: Four times a day (QID) | ORAL | Status: DC | PRN
  Administered 2013-03-13 – 2013-03-19 (×6): 650 mg via ORAL

## 2013-03-12 MED ORDER — CHLORDIAZEPOXIDE HCL 25 MG PO CAPS
25.0000 mg | ORAL_CAPSULE | Freq: Once | ORAL | Status: AC
  Administered 2013-03-12: 25 mg via ORAL
  Filled 2013-03-12: qty 1

## 2013-03-12 MED ORDER — HYDROXYZINE HCL 25 MG PO TABS: 25.0000 mg | ORAL_TABLET | Freq: Four times a day (QID) | ORAL | Status: AC | PRN

## 2013-03-12 MED ORDER — PANTOPRAZOLE SODIUM 40 MG PO TBEC
40.0000 mg | DELAYED_RELEASE_TABLET | Freq: Every day | ORAL | Status: DC
  Administered 2013-03-12 – 2013-03-19 (×8): 40 mg via ORAL
  Filled 2013-03-12 (×11): qty 1

## 2013-03-12 MED ORDER — MAGNESIUM HYDROXIDE 400 MG/5ML PO SUSP: 30.0000 mL | Freq: Every day | ORAL | Status: DC | PRN

## 2013-03-12 MED ORDER — IBUPROFEN 200 MG PO TABS
400.0000 mg | ORAL_TABLET | Freq: Four times a day (QID) | ORAL | Status: DC | PRN
  Administered 2013-03-12: 400 mg via ORAL
  Filled 2013-03-12: qty 2

## 2013-03-12 MED ORDER — CHLORDIAZEPOXIDE HCL 25 MG PO CAPS
25.0000 mg | ORAL_CAPSULE | Freq: Four times a day (QID) | ORAL | Status: AC
  Administered 2013-03-13 (×4): 25 mg via ORAL
  Filled 2013-03-12 (×4): qty 1

## 2013-03-12 MED ORDER — TAMSULOSIN HCL 0.4 MG PO CAPS
0.4000 mg | ORAL_CAPSULE | Freq: Every day | ORAL | Status: DC
  Administered 2013-03-12 – 2013-03-19 (×8): 0.4 mg via ORAL
  Filled 2013-03-12 (×6): qty 1
  Filled 2013-03-12: qty 4
  Filled 2013-03-12 (×4): qty 1

## 2013-03-12 MED ORDER — LOPERAMIDE HCL 2 MG PO CAPS
2.0000 mg | ORAL_CAPSULE | ORAL | Status: AC | PRN
  Administered 2013-03-13 – 2013-03-14 (×2): 4 mg via ORAL
  Administered 2013-03-15: 2 mg via ORAL

## 2013-03-12 MED ORDER — CHLORDIAZEPOXIDE HCL 25 MG PO CAPS
25.0000 mg | ORAL_CAPSULE | ORAL | Status: AC
  Administered 2013-03-15 (×2): 25 mg via ORAL
  Filled 2013-03-12 (×2): qty 1

## 2013-03-12 NOTE — ED Notes (Addendum)
Pt asking to speak to charge RN. This RN in to speak with pt. Pt reports staff has been disrespectful and unprofessional. Sts staff kicked his guests out because of threats he said, that they didn't say anything. Then pt denies saying any threats. Explained to pt that guests are a privilege and if staff feel pt would be receive better care without guests present, the staff reserves this right. Pt then proceeded to change subject and talk about how he didn't bring insurance that would pay "your salary and their salary, and theirs." Pt informed that in the emergency room, pts are treated with or without insurance, and the only thing that determines who is treated first is the urgency of need for medical care. Pt speaking of how he has been here for 5 hours. Informed pt of actual times in dept and apologized multiple times for wait times. Pt appears more calm after RN spoke with him. Asking for something to drink. Will provide then transfer to [facility].

## 2013-03-12 NOTE — ED Provider Notes (Signed)
History    This chart was scribed for non-physician practitioner working with Gavin Pound. Oletta Lamas, MD by Leone Payor, ED Scribe. This patient was seen in room [facility] and the patient's care was started at 1425.   CSN: [id number]  Arrival date & time 03/12/13  1425   First MD Initiated Contact with Patient 03/12/13 1558      Chief Complaint  Patient presents with  . Medical Clearance     The history is provided by the patient. No language interpreter was used.    [name] [name] is a 52 y.o. male who presents to the Emergency Department requesting medical clearance today. Pt is here from behavioral health, he has a bed ready and he needs to be screened and to have blood work drawn. Requesting help with ETOH and cocaine detox. Pt states he was in rehab in the past. Pt admits to drinking a 6 pack of beer and half pint of liquor a day. Does cocaine, last used yesterday. He denies CP, SOB, nausea, vomiting, diarrhea. Denies any withdrawal symptoms.  Requests medication for his chronic back pain, which is unchanged.  Pt takes percocet x3 a day, prescribed by Dr. Jeanice Lim.     Pt has h/o pancreatitis (March 2013).  Past Medical History  Diagnosis Date  . Bronchitis   . Acid reflux   . Pancreatitis     March 2013  . Gout   . Bipolar disorder   . Hyperlipidemia   . Alcohol abuse     6-8 cans of beer daily  . Pseudocyst of pancreas 02/28/2012  . Arthritis   . Fracture of lower leg   . Hypertension   . Chronic pain   . Chronic neck pain   . Chronic back pain   . Left arm pain     chronic    Past Surgical History  Procedure Laterality Date  . Nose surgery      broken nose  . Circumcision  03/17/2012    Procedure: CIRCUMCISION ADULT;  Surgeon: Ky Barban, MD;  Location: [facility];  Service: Urology;  Laterality: N/A;  . Colonoscopy with propofol  12/24/2012    Procedure: COLONOSCOPY WITH PROPOFOL;  Surgeon: Corbin Ade, MD;  Location: [facility];  Service: Endoscopy;  Laterality: N/A;   started at 0803, in cecum at 0812, withdrawel time Biopsy of anal lesion  . Esophagogastroduodenoscopy (egd) with propofol  12/24/2012    Procedure: ESOPHAGOGASTRODUODENOSCOPY (EGD) WITH PROPOFOL;  Surgeon: Corbin Ade, MD;  Location: [facility];  Service: Endoscopy;  Laterality: N/A;  done at 0800    Family History  Problem Relation Age of Onset  . Diabetes Father   . Anesthesia problems Neg Hx   . Hypotension Neg Hx   . Malignant hyperthermia Neg Hx   . Pseudochol deficiency Neg Hx   . Colon cancer Neg Hx     History  Substance Use Topics  . Smoking status: Current Every Day Smoker -- 0.50 packs/day for 25 years    Types: Cigarettes  . Smokeless tobacco: Not on file  . Alcohol Use: 0.0 oz/week     Comment: 6-8 beers a day      Review of Systems  Constitutional: Negative for fever and chills.  Respiratory: Negative for cough and shortness of breath.   Cardiovascular: Negative for chest pain.  Gastrointestinal: Negative for nausea, vomiting and abdominal pain.     Allergies  Penicillins  Home Medications   Current Outpatient Rx  Name  Route  Sig  Dispense  Refill  . albuterol (PROVENTIL HFA;VENTOLIN HFA) 108 (90 BASE) MCG/ACT inhaler   Inhalation   Inhale 2 puffs into the lungs every 4 (four) hours as needed for wheezing.   1 Inhaler   2   . citalopram (CELEXA) 10 MG tablet   Oral   Take 10 mg by mouth daily.         . fish oil-omega-3 fatty acids 1000 MG capsule   Oral   Take 1 g by mouth daily.          Marland Kitchen gemfibrozil (LOPID) 600 MG tablet   Oral   Take 1 tablet (600 mg total) by mouth 2 (two) times daily before a meal.   60 tablet   3   . guaiFENesin (MUCINEX) 600 MG 12 hr tablet   Oral   Take 600 mg by mouth 2 (two) times daily.         Marland Kitchen HYDROcodone-acetaminophen (NORCO/VICODIN) 5-325 MG per tablet   Oral   Take 1-2 tablets by mouth every 6 (six) hours as needed for pain.   15 tablet   0   . Multiple Vitamin (MULTIVITAMIN WITH  MINERALS) TABS   Oral   Take 1 tablet by mouth daily.         Marland Kitchen omeprazole (PRILOSEC) 20 MG capsule   Oral   Take 1 capsule (20 mg total) by mouth daily.   30 capsule   3   . promethazine (PHENERGAN) 12.5 MG tablet   Oral   Take 1 tablet (12.5 mg total) by mouth every 6 (six) hours as needed for nausea.   20 tablet   0   . Tamsulosin HCl (FLOMAX) 0.4 MG CAPS   Oral   Take 1 capsule (0.4 mg total) by mouth daily.   30 capsule   6   . traZODone (DESYREL) 100 MG tablet   Oral   Take 100 mg by mouth at bedtime as needed. Sleep           BP 121/70  Pulse 84  Temp(Src) 97.8 F (36.6 C) (Oral)  SpO2 97%  Physical Exam  Nursing note and vitals reviewed. Constitutional: He appears well-developed and well-nourished. No distress.  HENT:  Head: Normocephalic and atraumatic.  Neck: Neck supple.  Cardiovascular: Normal rate and regular rhythm.   Pulmonary/Chest: Effort normal and breath sounds normal.  Abdominal: Soft. There is generalized tenderness. There is no rebound and no guarding.  Neurological: He is alert.  Skin: He is not diaphoretic.    ED Course  Procedures (including critical care time)  DIAGNOSTIC STUDIES: Oxygen Saturation is 97% on room air, adequate by my interpretation.    COORDINATION OF CARE: 4:04 PM Discussed treatment plan with pt at bedside and pt agreed to plan.    Labs Reviewed  CBC WITH DIFFERENTIAL - Abnormal; Notable for the following:    WBC 3.5 (*)    RBC 4.10 (*)    Neutrophils Relative 41 (*)    Neutro Abs 1.5 (*)    Lymphocytes Relative 51 (*)    Basophils Relative 2 (*)    All other components within normal limits  URINALYSIS, ROUTINE W REFLEX MICROSCOPIC - Abnormal; Notable for the following:    Specific Gravity, Urine 1.031 (*)    Hgb urine dipstick LARGE (*)    All other components within normal limits  COMPREHENSIVE METABOLIC PANEL - Abnormal; Notable for the following:    Potassium 3.4 (*)    Glucose, Bld  104 (*)     Albumin 3.3 (*)    AST 71 (*)    All other components within normal limits  URINE RAPID DRUG SCREEN (HOSP PERFORMED) - Abnormal; Notable for the following:    Opiates POSITIVE (*)    Cocaine POSITIVE (*)    All other components within normal limits  ETHANOL - Abnormal; Notable for the following:    Alcohol, Ethyl (B) 224 (*)    All other components within normal limits  URINE MICROSCOPIC-ADD ON   No results found.   1. Polysubstance abuse     MDM  Pt sent from behavioral health for medical clearance.  Cocaine and ETOH abuse.  Pt also with chronic pain, requesting pain medication.  However, ETOH level is high at this time, will give ibuprofen only.  No SI, HI.  Pt cleared for behavioral health.  To be transferred to behavioral health.    I personally performed the services described in this documentation, which was scribed in my presence. The recorded information has been reviewed and is accurate.   Trixie Dredge, PA-C 03/12/13 1825

## 2013-03-12 NOTE — ED Notes (Signed)
Pt was sent over from [facility] and has a bed ready and pt just needs to be screened and to have blood work drawn. Pt is voluntary and is not SI, states that he does do crack cocaine and drinks alcohol. Calm and alert.

## 2013-03-12 NOTE — ED Notes (Signed)
Pt is very argumentative with staff when asking to give urine and to stay in the room. Pt wants to continue to leave the dept. Staff gave pt sandwich and drink. Family with pt. Asked pt again for urine and he said that he will give it this time and he has been here long enough. Explained that we will just need this test and we will be able to send him across the street hopefully with this resulted.

## 2013-03-12 NOTE — ED Provider Notes (Signed)
Medical screening examination/treatment/procedure(s) were performed by non-physician practitioner and as supervising physician I was immediately available for consultation/collaboration.  Barbara Keng Y. Ziare Orrick, MD 03/12/13 2339 
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
## 2013-03-12 NOTE — Progress Notes (Signed)
Admission Note  D: Patient appropriate and cooperative with staff. Patient's affect/mood is flat and depressed. Patient verbalized that he is here for cocaine and alcohol detox. He stated, "I drink a 6 pack and a half of pint liquor everyday". Patient verbalized that his longest period of sobriety was 15 months.  A: Support and encouragement provided to patient. Oriented patient to the unit and informed of the unit rules/policies. Initiated Q15 minute checks for safety.  R: Patient receptive. Denies SI/HI/AVH. Patient remains safe.

## 2013-03-12 NOTE — Tx Team (Signed)
Initial Interdisciplinary Treatment Plan  PATIENT STRENGTHS: (choose at least two) Ability for insight Communication skills General fund of knowledge Motivation for treatment/growth  PATIENT STRESSORS: Financial difficulties Medication change or noncompliance Substance abuse   PROBLEM LIST: Problem List/Patient Goals Date to be addressed Date deferred Reason deferred Estimated date of resolution  Substance abuse      Depression                                                 DISCHARGE CRITERIA:  Ability to meet basic life and health needs Adequate post-discharge living arrangements Improved stabilization in mood, thinking, and/or behavior Motivation to continue treatment in a less acute level of care  PRELIMINARY DISCHARGE PLAN: Attend aftercare/continuing care group Attend 12-step recovery group Outpatient therapy Return to previous living arrangement  PATIENT/FAMIILY INVOLVEMENT: This treatment plan has been presented to and reviewed with the patient, [name] [name].  The patient and family have been given the opportunity to ask questions and make suggestions.  Harold Barban E 03/12/2013, 7:05 PM

## 2013-03-12 NOTE — ED Notes (Signed)
Pt pulling code button yelling that he wants to leave "ASAP" he is stating that he is not being seen due to he is "Black" staff explained to pt that we are waiting on the act team to finish all the paper work for him to be sent over to [facility] and that it is just a process and she is in the process of doing this. Pt states that he wants his prozac medication and that he does not want these meds that we are ordering him. Pt pacing the floors not wanting to stay in his room. Security at bedside.

## 2013-03-13 DIAGNOSIS — F10239 Alcohol dependence with withdrawal, unspecified: Principal | ICD-10-CM

## 2013-03-13 DIAGNOSIS — F1020 Alcohol dependence, uncomplicated: Secondary | ICD-10-CM

## 2013-03-13 DIAGNOSIS — F1590 Other stimulant use, unspecified, uncomplicated: Secondary | ICD-10-CM | POA: Diagnosis present

## 2013-03-13 DIAGNOSIS — F1410 Cocaine abuse, uncomplicated: Secondary | ICD-10-CM

## 2013-03-13 MED ORDER — AMLODIPINE BESYLATE 5 MG PO TABS
5.0000 mg | ORAL_TABLET | Freq: Every day | ORAL | Status: DC
  Administered 2013-03-13 – 2013-03-19 (×7): 5 mg via ORAL
  Filled 2013-03-13 (×6): qty 1
  Filled 2013-03-13: qty 4
  Filled 2013-03-13 (×3): qty 1

## 2013-03-13 MED ORDER — IBUPROFEN 800 MG PO TABS
800.0000 mg | ORAL_TABLET | Freq: Four times a day (QID) | ORAL | Status: DC | PRN
  Administered 2013-03-13 (×2): 800 mg via ORAL
  Filled 2013-03-13 (×2): qty 1

## 2013-03-13 MED ORDER — QUETIAPINE FUMARATE 200 MG PO TABS
200.0000 mg | ORAL_TABLET | Freq: Every day | ORAL | Status: DC
  Administered 2013-03-13 – 2013-03-18 (×6): 200 mg via ORAL
  Filled 2013-03-13: qty 1
  Filled 2013-03-13: qty 4
  Filled 2013-03-13 (×6): qty 1

## 2013-03-13 MED ORDER — GUAIFENESIN ER 600 MG PO TB12
600.0000 mg | ORAL_TABLET | Freq: Two times a day (BID) | ORAL | Status: DC
  Administered 2013-03-13 – 2013-03-19 (×12): 600 mg via ORAL
  Filled 2013-03-13 (×16): qty 1

## 2013-03-13 NOTE — Progress Notes (Signed)
D.  Pt pleasant on approach, denies complaints at this time.  Positive for evening AA group.  Interacting appropriately within milieu.  Denies SI/HI/hallucinations at this time.  Continuing to report left leg/hip pain.  A.  Medication given as ordered for pain.  Support and encouragement offered  R.  Pt remains safe on unit, will continue to monitor.

## 2013-03-13 NOTE — H&P (Signed)
Psychiatric Admission Assessment Adult  Patient Identification:  [name] [name] Date of Evaluation:  03/13/2013  Chief Complaint:  ETOH DEPENDENCE cocaine abuse  History of Present Illness: This is an admission assessment for this 52 year old African-American male. Admitted to [facility] as a walk-in, medically cleared at the [facility] ED. His complaints included increased alcohol/cocaine use/abuse. Patient reports, "I came in to this hospital yesterday because I need some help to stop alcohol and cocaine abuse. I drink beer, liquor and use cocaine on a daily basis. I started using cocaine at age of 78 and alcohol when I was 17. I drink 6 packs daily and use cocaine twice daily. I have been sober from alcohol and drug x 15 months. At the time, I was incarcerated for selling drugs. I recently was charged with DUI and I have a pending court date on 03/15/13. I also was charged with DUI in 2012 and 2013 respectively. I have got to stop this shit or I'm my girl-friend is going to leave me. I'm not depressed, I really do not have any suicidal thoughts. But I hurt all the time to my left leg and back. I take narcotic for it. I need something for my pain. Right now, I'm having a little shakes, cold/hot sweats".   Elements:  Location:  [facility] adult unit. Quality:  "I crave alhol and cocaine all the time". Severity:  "I drink beer/liquor all day long, Cocaine 2 times a week". Timing:  "I have been drinking and drugging heavily since the beginning of this year". Duration:  "I have been using since the age of 21". Context:  "I have been incercerated for selling drugs, DUIs 2012, 2013, 2014. pending charges and court date 03/15/13".  Associated Signs/Synptoms:  Depression Symptoms:  insomnia, psychomotor agitation, feelings of worthlessness/guilt, hopelessness,  (Hypo) Manic Symptoms:  Impulsivity,  Anxiety Symptoms:  Excessive Worry,  Psychotic Symptoms:  Hallucinations: Denies  PTSD Symptoms: Had  a traumatic exposure:  None reported  Psychiatric Specialty Exam: Physical Exam  Constitutional: He is oriented to person, place, and time. He appears well-nourished.  HENT:  Head: Normocephalic.  Eyes: Pupils are equal, round, and reactive to light.  Neck: Normal range of motion.  Cardiovascular: Normal rate.   Respiratory: Effort normal.  GI: Soft.  Musculoskeletal: He exhibits tenderness.  Complained of pain to left foot and back  Neurological: He is alert and oriented to person, place, and time.  Skin: Skin is warm and dry.  Psychiatric: His speech is normal and behavior is normal. Thought content normal. His mood appears anxious. Cognition and memory are normal. He expresses impulsivity. He does not exhibit a depressed mood.    Review of Systems  Constitutional: Negative.   HENT: Negative.   Eyes: Negative.   Respiratory: Positive for cough.        And congestion  Cardiovascular: Negative.   Gastrointestinal: Negative.   Genitourinary: Negative.   Musculoskeletal: Positive for myalgias, back pain and joint pain.  Skin: Negative.   Neurological: Negative.   Endo/Heme/Allergies: Negative.   Psychiatric/Behavioral: Positive for substance abuse (Cocaine and ETOH). Negative for depression, suicidal ideas, hallucinations and memory loss. The patient has insomnia. The patient is not nervous/anxious.     Blood pressure 137/93, pulse 103, temperature 97.7 F (36.5 C), temperature source Oral, resp. rate 20, height 5' 8.11" (1.73 m), weight 81.647 kg (180 lb), SpO2 98.00%.Body mass index is 27.28 kg/(m^2).  General Appearance: Disheveled and Poor grooming  Eye Contact::  Fair  Speech:  Clear and Coherent  Volume:  Normal  Mood:  Hopeless and Worthless  Affect:  Flat  Thought Process:  Coherent  Orientation:  Full (Time, Place, and Person)  Thought Content:  Rumination and Denies hallucinations  Suicidal Thoughts:  No  Homicidal Thoughts:  No  Memory:  Immediate;    Good Recent;   Good Remote;   Good  Judgement:  Impaired  Insight:  Shallow  Psychomotor Activity:  Tremor and cold/hot sweats  Concentration:  Fair  Recall:  Good  Akathisia:  No  Handed:  Right  AIMS (if indicated):     Assets:  Desire for Improvement  Sleep:  Number of Hours: 6.75    Past Psychiatric History: Diagnosis: Alcohol dependence, Alcohol withdrawal, cocaine abuse  Hospitalizations: [facility]  Outpatient Care: [facility]  Substance Abuse Care: [facility] ,2014  Self-Mutilation: Denies  Suicidal Attempts: Denies attempts and or thoughts  Violent Behaviors: None reported   Past Medical History:   Past Medical History  Diagnosis Date  . Bronchitis   . Acid reflux   . Pancreatitis     March 2013  . Gout   . Bipolar disorder   . Hyperlipidemia   . Alcohol abuse     6-8 cans of beer daily  . Pseudocyst of pancreas 02/28/2012  . Arthritis   . Fracture of lower leg   . Hypertension   . Chronic pain   . Chronic neck pain   . Chronic back pain   . Left arm pain     chronic   Cardiac History:  HTN  Allergies:   Allergies  Allergen Reactions  . Penicillins Itching   PTA Medications: Prescriptions prior to admission  Medication Sig Dispense Refill  . albuterol (PROVENTIL HFA;VENTOLIN HFA) 108 (90 BASE) MCG/ACT inhaler Inhale 2 puffs into the lungs every 4 (four) hours as needed for wheezing.  1 Inhaler  2  . citalopram (CELEXA) 10 MG tablet Take 10 mg by mouth daily.      . fish oil-omega-3 fatty acids 1000 MG capsule Take 1 g by mouth daily.       Marland Kitchen gemfibrozil (LOPID) 600 MG tablet Take 1 tablet (600 mg total) by mouth 2 (two) times daily before a meal.  60 tablet  3  . guaiFENesin (MUCINEX) 600 MG 12 hr tablet Take 600 mg by mouth 2 (two) times daily.      Marland Kitchen HYDROcodone-acetaminophen (NORCO/VICODIN) 5-325 MG per tablet Take 1-2 tablets by mouth every 6 (six) hours as needed for pain.  15 tablet  0  . Multiple Vitamin (MULTIVITAMIN WITH MINERALS) TABS Take 1 tablet by  mouth daily.      Marland Kitchen omeprazole (PRILOSEC) 20 MG capsule Take 1 capsule (20 mg total) by mouth daily.  30 capsule  3  . promethazine (PHENERGAN) 12.5 MG tablet Take 1 tablet (12.5 mg total) by mouth every 6 (six) hours as needed for nausea.  20 tablet  0  . Tamsulosin HCl (FLOMAX) 0.4 MG CAPS Take 1 capsule (0.4 mg total) by mouth daily.  30 capsule  6  . traZODone (DESYREL) 100 MG tablet Take 100 mg by mouth at bedtime as needed. Sleep        Previous Psychotropic Medications:  Medication/Dose  See medication lists above               Substance Abuse History in the last 12 months:  yes  Consequences of Substance Abuse: Medical Consequences:  Liver damage, Possible death by overdose  Legal Consequences:  Arrests, jail time, Loss of driving privilege. Family Consequences:  Family discord, divorce and or separation.  Social History:  reports that he has been smoking Cigarettes.  He has a 12.5 pack-year smoking history. He does not have any smokeless tobacco history on file. He reports that  drinks alcohol. He reports that he uses illicit drugs (Cocaine and Marijuana). Additional Social History: Pain Medications: Pt reports he takes his prescription medications as prescribed Prescriptions: Pt reports he takes his prescription medications as prescribed Over the Counter: Denies History of alcohol / drug use?: Yes Longest period of sobriety (when/how long): 15 month in 2010-2011 Negative Consequences of Use: Financial;Legal;Personal relationships;Work / Programmer, multimedia Withdrawal Symptoms: Agitation (Denies current withdrawal sxs) Name of Substance 1: Cocaine 1 - Age of First Use: 18 1 - Amount (size/oz): 6-24 cans of beer plus 1/2 pint liquor 1 - Frequency: Daily 1 - Duration: 2 months this episode, has drank heavily for years 1 - Last Use / Amount: 03/12/2013, 1/4 pint liquor Name of Substance 2: Cocaine (crack) 2 - Age of First Use: 30 2 - Amount (size/oz): $20 worth 2 - Frequency:  Approximately 2x per week 2 - Duration: Several years 2 - Last Use / Amount: 03/11/2013, $10 worth  Current Place of Residence: [place], [place]  Place of Birth: [place], [place]    Family Members: "My girl-friend"  Marital Status:  Single  Children: 1  Sons: 1  Daughters: 0  Relationships: "I'm single, but with a girl-friend"  Education:  No high school diploma  Educational Problems/Performance: Did not complete high school  Religious Beliefs/Practices: None reported  History of Abuse (Emotional/Phsycial/Sexual): None reported  Occupational Experiences: Unemployed  Military History:  None.  Legal History: Recent DUI, up-coming court date 03/15/13  Hobbies/Interests: None reported  Family History:   Family History  Problem Relation Age of Onset  . Diabetes Father   . Anesthesia problems Neg Hx   . Hypotension Neg Hx   . Malignant hyperthermia Neg Hx   . Pseudochol deficiency Neg Hx   . Colon cancer Neg Hx     Results for orders placed during the hospital encounter of 03/12/13 (from the past 72 hour(s))  CBC WITH DIFFERENTIAL     Status: Abnormal   Collection Time    03/12/13  3:02 PM      Result Value Range   WBC 3.5 (*) 4.0 - 10.5 K/uL   RBC 4.10 (*) 4.22 - 5.81 MIL/uL   Hemoglobin 13.6  13.0 - 17.0 g/dL   HCT 14.7  82.9 - 56.2 %   MCV 96.3  78.0 - 100.0 fL   MCH 33.2  26.0 - 34.0 pg   MCHC 34.4  30.0 - 36.0 g/dL   RDW 13.0  86.5 - 78.4 %   Platelets 256  150 - 400 K/uL   Neutrophils Relative 41 (*) 43 - 77 %   Neutro Abs 1.5 (*) 1.7 - 7.7 K/uL   Lymphocytes Relative 51 (*) 12 - 46 %   Lymphs Abs 1.8  0.7 - 4.0 K/uL   Monocytes Relative 4  3 - 12 %   Monocytes Absolute 0.2  0.1 - 1.0 K/uL   Eosinophils Relative 2  0 - 5 %   Eosinophils Absolute 0.1  0.0 - 0.7 K/uL   Basophils Relative 2 (*) 0 - 1 %   Basophils Absolute 0.1  0.0 - 0.1 K/uL  COMPREHENSIVE METABOLIC PANEL     Status: Abnormal   Collection Time  03/12/13  3:02 PM      Result  Value Range   Sodium 140  135 - 145 mEq/L   Potassium 3.4 (*) 3.5 - 5.1 mEq/L   Chloride 102  96 - 112 mEq/L   CO2 23  19 - 32 mEq/L   Glucose, Bld 104 (*) 70 - 99 mg/dL   BUN 12  6 - 23 mg/dL   Creatinine, Ser 4.09  0.50 - 1.35 mg/dL   Calcium 8.7  8.4 - 81.1 mg/dL   Total Protein 7.0  6.0 - 8.3 g/dL   Albumin 3.3 (*) 3.5 - 5.2 g/dL   AST 71 (*) 0 - 37 U/L   ALT 16  0 - 53 U/L   Alkaline Phosphatase 57  39 - 117 U/L   Total Bilirubin 0.6  0.3 - 1.2 mg/dL   GFR calc non Af Amer >90  >90 mL/min   GFR calc Af Amer >90  >90 mL/min   Comment:            The eGFR has been calculated     using the CKD EPI equation.     This calculation has not been     validated in all clinical     situations.     eGFR's persistently     <90 mL/min signify     possible Chronic Kidney Disease.  ETHANOL     Status: Abnormal   Collection Time    03/12/13  3:02 PM      Result Value Range   Alcohol, Ethyl (B) 224 (*) 0 - 11 mg/dL   Comment:            LOWEST DETECTABLE LIMIT FOR     SERUM ALCOHOL IS 11 mg/dL     FOR MEDICAL PURPOSES ONLY  URINALYSIS, ROUTINE W REFLEX MICROSCOPIC     Status: Abnormal   Collection Time    03/12/13  3:51 PM      Result Value Range   Color, Urine YELLOW  YELLOW   APPearance CLEAR  CLEAR   Specific Gravity, Urine 1.031 (*) 1.005 - 1.030   pH 6.0  5.0 - 8.0   Glucose, UA NEGATIVE  NEGATIVE mg/dL   Hgb urine dipstick LARGE (*) NEGATIVE   Bilirubin Urine NEGATIVE  NEGATIVE   Ketones, ur NEGATIVE  NEGATIVE mg/dL   Protein, ur NEGATIVE  NEGATIVE mg/dL   Urobilinogen, UA 1.0  0.0 - 1.0 mg/dL   Nitrite NEGATIVE  NEGATIVE   Leukocytes, UA NEGATIVE  NEGATIVE  URINE RAPID DRUG SCREEN (HOSP PERFORMED)     Status: Abnormal   Collection Time    03/12/13  3:51 PM      Result Value Range   Opiates POSITIVE (*) NONE DETECTED   Cocaine POSITIVE (*) NONE DETECTED   Benzodiazepines NONE DETECTED  NONE DETECTED   Amphetamines NONE DETECTED  NONE DETECTED   Tetrahydrocannabinol  NONE DETECTED  NONE DETECTED   Barbiturates NONE DETECTED  NONE DETECTED   Comment:            DRUG SCREEN FOR MEDICAL PURPOSES     ONLY.  IF CONFIRMATION IS NEEDED     FOR ANY PURPOSE, NOTIFY LAB     WITHIN 5 DAYS.                LOWEST DETECTABLE LIMITS     FOR URINE DRUG SCREEN     Drug Class       Cutoff (ng/mL)  Amphetamine      1000     Barbiturate      200     Benzodiazepine   200     Tricyclics       300     Opiates          300     Cocaine          300     THC              50  URINE MICROSCOPIC-ADD ON     Status: None   Collection Time    03/12/13  3:51 PM      Result Value Range   RBC / HPF 0-2  <3 RBC/hpf   Urine-Other MUCOUS PRESENT     Psychological Evaluations:  Assessment:   AXIS I:  Alcohol dependence, Alcohol withdrawal, Cocaine abuse AXIS II:  Deferred AXIS III:   Past Medical History  Diagnosis Date  . Bronchitis   . Acid reflux   . Pancreatitis     March 2013  . Gout   . Bipolar disorder   . Hyperlipidemia   . Alcohol abuse     6-8 cans of beer daily  . Pseudocyst of pancreas 02/28/2012  . Arthritis   . Fracture of lower leg   . Hypertension   . Chronic pain   . Chronic neck pain   . Chronic back pain   . Left arm pain     chronic   AXIS IV:  economic problems, educational problems, occupational problems, other psychosocial or environmental problems and Substance absue AXIS V:  21-30 behavior considerably influenced by delusions or hallucinations OR serious impairment in judgment, communication OR inability to function in almost all areas  Treatment Plan/Recommendations: 1. Admit for crisis management and stabilization, estimated length of stay 3-5 days.  2. Medication management to reduce current symptoms to base line and improve the patient's overall level of functioning  3. Treat health problems as indicated: (a). Norvasc 5 mg daily for HTN                                                               (b). Mucinex 600 mg bid for  congestion.                                                               (c). Ibuprofen 800 mg Q 6 hours PRN for pain.                                                               (b). Seroquel 200 mg Q bedtime for mood control  4. Develop treatment plan to decrease risk of relapse upon discharge and the need for readmission.  5. Psycho-social education regarding relapse prevention and self care.  6. Health care follow up as needed for medical problems.  7. Review, reconcile, and reinstate any pertinent home medications for other health issues where appropriate. 8. Call for consults with hospitalist for any additional specialty patient care services as needed.  Treatment Plan Summary: Daily contact with patient to assess and evaluate symptoms and progress in treatment Medication management  Current Medications:  Current Facility-Administered Medications  Medication Dose Route Frequency Provider Last Rate Last Dose  . acetaminophen (TYLENOL) tablet 650 mg  650 mg Oral Q6H PRN Shuvon Rankin, NP   650 mg at 03/13/13 0810  . albuterol (PROVENTIL HFA;VENTOLIN HFA) 108 (90 BASE) MCG/ACT inhaler 2 puff  2 puff Inhalation Q4H PRN Shuvon Rankin, NP      . alum & mag hydroxide-simeth (MAALOX/MYLANTA) 200-200-20 MG/5ML suspension 30 mL  30 mL Oral Q4H PRN Shuvon Rankin, NP   30 mL at 03/12/13 1959  . chlordiazePOXIDE (LIBRIUM) capsule 25 mg  25 mg Oral Q6H PRN Shuvon Rankin, NP   25 mg at 03/12/13 2157  . chlordiazePOXIDE (LIBRIUM) capsule 25 mg  25 mg Oral QID Shuvon Rankin, NP   25 mg at 03/13/13 4540   Followed by  . [START ON 03/14/2013] chlordiazePOXIDE (LIBRIUM) capsule 25 mg  25 mg Oral TID Shuvon Rankin, NP       Followed by  . [START ON 03/15/2013] chlordiazePOXIDE (LIBRIUM) capsule 25 mg  25 mg Oral BH-qamhs Shuvon Rankin, NP        Followed by  . [START ON 03/16/2013] chlordiazePOXIDE (LIBRIUM) capsule 25 mg  25 mg Oral Daily Shuvon Rankin, NP      . citalopram (CELEXA) tablet 10 mg  10 mg Oral Daily Shuvon Rankin, NP   10 mg at 03/13/13 9811  . gemfibrozil (LOPID) tablet 600 mg  600 mg Oral BID AC Shuvon Rankin, NP   600 mg at 03/13/13 0605  . hydrOXYzine (ATARAX/VISTARIL) tablet 25 mg  25 mg Oral Q6H PRN Shuvon Rankin, NP      . ibuprofen (ADVIL,MOTRIN) tablet 400 mg  400 mg Oral Q6H PRN Verne Spurr, PA-C   400 mg at 03/12/13 2158  . loperamide (IMODIUM) capsule 2-4 mg  2-4 mg Oral PRN Shuvon Rankin, NP      . magnesium hydroxide (MILK OF MAGNESIA) suspension 30 mL  30 mL Oral Daily PRN Shuvon Rankin, NP      . multivitamin with minerals tablet 1 tablet  1 tablet Oral Daily Shuvon Rankin, NP   1 tablet at 03/13/13 0808  . omega-3 acid ethyl esters (LOVAZA) capsule 1 g  1 g Oral Daily Shuvon Rankin, NP   1 g at 03/13/13 0808  . ondansetron (ZOFRAN-ODT) disintegrating tablet 4 mg  4 mg Oral Q6H PRN Shuvon Rankin, NP      . pantoprazole (PROTONIX) EC tablet 40 mg  40 mg Oral Daily Shuvon Rankin, NP   40 mg at 03/13/13 0808  . tamsulosin (FLOMAX) capsule 0.4 mg  0.4 mg Oral Daily Shuvon Rankin, NP   0.4 mg at 03/12/13 2158  . thiamine (B-1) injection 100 mg  100 mg Intramuscular Once Shuvon Rankin, NP      . thiamine (VITAMIN B-1) tablet 100 mg  100 mg Oral Daily Shuvon Rankin, NP   100 mg at 03/13/13 0808  . traZODone (DESYREL) tablet 100 mg  100 mg Oral QHS PRN Shuvon Rankin, NP   100 mg at 03/12/13 2157  Observation Level/Precautions:  15 minute checks  Laboratory:  Reviewed ED lab findings on file  Psychotherapy: Group sessions/AANA meetings    Medications: See medication lists   Consultations: As needed   Discharge Concerns: Maintaining sobriety   Estimated LOS: 5-7 days   Other:     I certify that inpatient services furnished can reasonably be expected to improve the patient's condition.   Armandina Stammer  I 3/22/20149:03 AM

## 2013-03-13 NOTE — Progress Notes (Signed)
Adult Psychoeducational Group Note  Date:  03/13/2013 Time:  0900  Group Topic/Focus:  Patient self inventory group  Participation Level:  Active  Participation Quality:  Appropriate, Attentive, Sharing and Supportive  Affect:  Appropriate and Depressed  Cognitive:  Alert and Appropriate  Insight: Appropriate, Good and Improving  Engagement in Group:  Engaged and Improving  Modes of Intervention:  Support  Additional Comments:  Patient shared information and supported other patients in group.   Patient stated it is difficult for him to discuss his life experiences.    Quintella Reichert [facility] 03/13/2013, 10:42 AM

## 2013-03-13 NOTE — Progress Notes (Signed)
Patient ID: [name] [name], male   DOB: 1960-12-24, 52 y.o.   MRN: [id number] D: Pt is awake and active on the unit this AM. Pt denies SI/HI and A/V hallucinations. Pt did not rate  their depression or hopelessness. Pt's most recent CIWA score was 3. Pt mood is depressed and his affect is flat. Pt writes that he is dealing with pain in his leg and back, and that he plans to "stop drinking and stop using." Pt requested medications adjustments which Clinical research associate addressed with the NP. He also writes that he wishes to speak with his MD. Ranae Plumber informed pt that he would likely see the MD on Monday at the latest.   A: Encouraged pt to discuss feelings with staff and administered medication per MD orders. Writer also encouraged pt to attend groups.  R: Pt is attending groups and tolerating medications well. Writer will continue to monitor. 15 minute checks are ongoing for safety.

## 2013-03-13 NOTE — Clinical Social Work Note (Signed)
[facility] Group Notes:  (Clinical Social Work)  03/13/2013     10-11AM  Summary of Progress/Problems:   The main focus of today's process group was for the patient to identify ways in which they have in the past sabotaged their own recovery. Motivational Interviewing was utilized to ask the group members what they get out of their substance use, and what they want to change.  The Stages of Change were explained, and members identified where they currently are with regard to stages of change.  The patient expressed that he is in the preparation stage of change.  He was drowsy throughout group.  His motivation is 8 out of 10, and to move that higher, his leg will have to heal, he said.  Type of Therapy:  Group Therapy - Process   Participation Level:  Minimal  Participation Quality:  Drowsy  Affect:  Blunted  Cognitive:  Appropriate and Oriented  Insight:  Engaged  Engagement in Therapy:  Engaged  Modes of Intervention:  Education, Teacher, English as a foreign language, Exploration, Discussion, Motivational Interviewing   Ambrose Mantle, LCSW 03/13/2013, 11:16 AM

## 2013-03-13 NOTE — Progress Notes (Signed)
Patient did attend the evening speaker AA meeting.  

## 2013-03-13 NOTE — Progress Notes (Signed)
Pt attended AA group; was attentive and engaged.
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
## 2013-03-14 MED ORDER — NICOTINE 21 MG/24HR TD PT24
21.0000 mg | MEDICATED_PATCH | Freq: Every day | TRANSDERMAL | Status: DC
  Administered 2013-03-14 – 2013-03-19 (×5): 21 mg via TRANSDERMAL
  Filled 2013-03-14 (×10): qty 1

## 2013-03-14 MED ORDER — LORATADINE 10 MG PO TABS
10.0000 mg | ORAL_TABLET | Freq: Every day | ORAL | Status: DC
  Administered 2013-03-14 – 2013-03-19 (×6): 10 mg via ORAL
  Filled 2013-03-14 (×10): qty 1

## 2013-03-14 MED ORDER — METHOCARBAMOL 500 MG PO TABS
500.0000 mg | ORAL_TABLET | Freq: Three times a day (TID) | ORAL | Status: DC
  Administered 2013-03-14 – 2013-03-17 (×8): 500 mg via ORAL
  Filled 2013-03-14 (×16): qty 1

## 2013-03-14 MED ORDER — CALCIUM CARBONATE ANTACID 500 MG PO CHEW
1.0000 | CHEWABLE_TABLET | Freq: Three times a day (TID) | ORAL | Status: DC
  Administered 2013-03-14 – 2013-03-19 (×13): 200 mg via ORAL
  Filled 2013-03-14 (×25): qty 1

## 2013-03-14 MED ORDER — NABUMETONE 750 MG PO TABS
750.0000 mg | ORAL_TABLET | Freq: Two times a day (BID) | ORAL | Status: DC
  Administered 2013-03-14 – 2013-03-19 (×10): 750 mg via ORAL
  Filled 2013-03-14 (×15): qty 1

## 2013-03-14 NOTE — Progress Notes (Signed)
Patient did attend the evening speaker AA meeting.  

## 2013-03-14 NOTE — Progress Notes (Signed)
D slept better last nite asking for meds, appetite is improving, energy level is low and ability to pay attention is improving, states depressed ok, and hopeless ok, WD s/s include diarrhea and cravings, denies Si or HI today, taking meds as ordered by MD, going to Dr for meals and attending groups. A q71min safety checks continue and support offered patient, encouraged to continue attending group R patient remains safe on the unit

## 2013-03-14 NOTE — Progress Notes (Signed)
[facility] MD Progress Note  03/14/2013 1:34 PM [name] [name]  MRN:  [id number] Subjective:  Denies depression, craving cigarettes and alcohol.  He complained of back pain--Robaxin and Relafen ordered for this issue.  Denies suicidal/homicidal ideations and auditory/visual hallucinations.  He wants Claritin for his allergies--ordered.  Then patient wanted TUMs for his indigestion--ordered.  Next, he wanted a lidoderm patch but was asked to first try the Robaxin and Relafen for pain.    Diagnosis:   Axis I: Substance Abuse and Substance Induced Mood Disorder Axis II: Deferred Axis III:  Past Medical History  Diagnosis Date  . Bronchitis   . Acid reflux   . Pancreatitis     March 2013  . Gout   . Bipolar disorder   . Hyperlipidemia   . Alcohol abuse     6-8 cans of beer daily  . Pseudocyst of pancreas 02/28/2012  . Arthritis   . Fracture of lower leg   . Hypertension   . Chronic pain   . Chronic neck pain   . Chronic back pain   . Left arm pain     chronic   Axis IV: economic problems, other psychosocial or environmental problems, problems related to social environment and problems with primary support group Axis V: 41-50 serious symptoms  ADL's:  Intact  Sleep: Fair  Appetite:  Fair  Suicidal Ideation:  Denies Homicidal Ideation:  Denies  Psychiatric Specialty Exam: Review of Systems  Constitutional: Negative.   HENT: Negative.   Eyes: Negative.   Respiratory: Negative.   Cardiovascular: Negative.   Gastrointestinal: Negative.   Genitourinary: Negative.   Musculoskeletal: Positive for back pain.  Skin: Negative.   Neurological: Negative.   Endo/Heme/Allergies: Negative.   Psychiatric/Behavioral: Positive for depression and substance abuse. The patient is nervous/anxious.     Blood pressure 134/88, pulse 86, temperature 98.5 F (36.9 C), temperature source Oral, resp. rate 16, height 5' 8.11" (1.73 m), weight 81.647 kg (180 lb), SpO2 98.00%.Body mass index is 27.28  kg/(m^2).  General Appearance: Casual  Eye Contact::  Fair  Speech:  Normal Rate  Volume:  Normal  Mood:  Anxious  Affect:  Congruent  Thought Process:  Coherent  Orientation:  Full (Time, Place, and Person)  Thought Content:  WDL  Suicidal Thoughts:  No  Homicidal Thoughts:  No  Memory:  Immediate;   Fair Recent;   Fair Remote;   Fair  Judgement:  Fair  Insight:  Fair  Psychomotor Activity:  Normal  Concentration:  Fair  Recall:  Fair  Akathisia:  No  Handed:  Right  AIMS (if indicated):     Assets:  Desire for Improvement Resilience Social Support  Sleep:  Number of Hours: 6.25   Current Medications: Current Facility-Administered Medications  Medication Dose Route Frequency Provider Last Rate Last Dose  . acetaminophen (TYLENOL) tablet 650 mg  650 mg Oral Q6H PRN Shuvon Rankin, NP   650 mg at 03/14/13 1159  . albuterol (PROVENTIL HFA;VENTOLIN HFA) 108 (90 BASE) MCG/ACT inhaler 2 puff  2 puff Inhalation Q4H PRN Shuvon Rankin, NP      . alum & mag hydroxide-simeth (MAALOX/MYLANTA) 200-200-20 MG/5ML suspension 30 mL  30 mL Oral Q4H PRN Shuvon Rankin, NP   30 mL at 03/14/13 0946  . amLODipine (NORVASC) tablet 5 mg  5 mg Oral Daily Sanjuana Kava, NP   5 mg at 03/14/13 0752  . chlordiazePOXIDE (LIBRIUM) capsule 25 mg  25 mg Oral Q6H PRN Assunta Found, NP  25 mg at 03/12/13 2157  . chlordiazePOXIDE (LIBRIUM) capsule 25 mg  25 mg Oral TID Shuvon Rankin, NP   25 mg at 03/14/13 1158   Followed by  . [START ON 03/15/2013] chlordiazePOXIDE (LIBRIUM) capsule 25 mg  25 mg Oral BH-qamhs Shuvon Rankin, NP       Followed by  . [START ON 03/16/2013] chlordiazePOXIDE (LIBRIUM) capsule 25 mg  25 mg Oral Daily Shuvon Rankin, NP      . citalopram (CELEXA) tablet 10 mg  10 mg Oral Daily Shuvon Rankin, NP   10 mg at 03/14/13 0752  . gemfibrozil (LOPID) tablet 600 mg  600 mg Oral BID AC Shuvon Rankin, NP   600 mg at 03/14/13 0554  . guaiFENesin (MUCINEX) 12 hr tablet 600 mg  600 mg Oral BID Sanjuana Kava, NP   600 mg at 03/14/13 4540  . hydrOXYzine (ATARAX/VISTARIL) tablet 25 mg  25 mg Oral Q6H PRN Shuvon Rankin, NP      . loperamide (IMODIUM) capsule 2-4 mg  2-4 mg Oral PRN Shuvon Rankin, NP   4 mg at 03/13/13 1155  . magnesium hydroxide (MILK OF MAGNESIA) suspension 30 mL  30 mL Oral Daily PRN Shuvon Rankin, NP      . methocarbamol (ROBAXIN) tablet 500 mg  500 mg Oral TID Nanine Means, NP      . multivitamin with minerals tablet 1 tablet  1 tablet Oral Daily Shuvon Rankin, NP   1 tablet at 03/14/13 9811  . nabumetone (RELAFEN) tablet 750 mg  750 mg Oral BID Nanine Means, NP      . omega-3 acid ethyl esters (LOVAZA) capsule 1 g  1 g Oral Daily Shuvon Rankin, NP   1 g at 03/14/13 0752  . ondansetron (ZOFRAN-ODT) disintegrating tablet 4 mg  4 mg Oral Q6H PRN Shuvon Rankin, NP      . pantoprazole (PROTONIX) EC tablet 40 mg  40 mg Oral Daily Shuvon Rankin, NP   40 mg at 03/14/13 0752  . QUEtiapine (SEROQUEL) tablet 200 mg  200 mg Oral QHS Sanjuana Kava, NP   200 mg at 03/13/13 2211  . tamsulosin (FLOMAX) capsule 0.4 mg  0.4 mg Oral Daily Shuvon Rankin, NP   0.4 mg at 03/14/13 0752  . thiamine (B-1) injection 100 mg  100 mg Intramuscular Once Shuvon Rankin, NP      . thiamine (VITAMIN B-1) tablet 100 mg  100 mg Oral Daily Shuvon Rankin, NP   100 mg at 03/14/13 0752  . traZODone (DESYREL) tablet 100 mg  100 mg Oral QHS PRN Shuvon Rankin, NP   100 mg at 03/13/13 2211    Lab Results:  Results for orders placed during the hospital encounter of 03/12/13 (from the past 48 hour(s))  CBC WITH DIFFERENTIAL     Status: Abnormal   Collection Time    03/12/13  3:02 PM      Result Value Range   WBC 3.5 (*) 4.0 - 10.5 K/uL   RBC 4.10 (*) 4.22 - 5.81 MIL/uL   Hemoglobin 13.6  13.0 - 17.0 g/dL   HCT 91.4  78.2 - 95.6 %   MCV 96.3  78.0 - 100.0 fL   MCH 33.2  26.0 - 34.0 pg   MCHC 34.4  30.0 - 36.0 g/dL   RDW 21.3  08.6 - 57.8 %   Platelets 256  150 - 400 K/uL   Neutrophils Relative 41 (*) 43 - 77  %   Neutro  Abs 1.5 (*) 1.7 - 7.7 K/uL   Lymphocytes Relative 51 (*) 12 - 46 %   Lymphs Abs 1.8  0.7 - 4.0 K/uL   Monocytes Relative 4  3 - 12 %   Monocytes Absolute 0.2  0.1 - 1.0 K/uL   Eosinophils Relative 2  0 - 5 %   Eosinophils Absolute 0.1  0.0 - 0.7 K/uL   Basophils Relative 2 (*) 0 - 1 %   Basophils Absolute 0.1  0.0 - 0.1 K/uL  COMPREHENSIVE METABOLIC PANEL     Status: Abnormal   Collection Time    03/12/13  3:02 PM      Result Value Range   Sodium 140  135 - 145 mEq/L   Potassium 3.4 (*) 3.5 - 5.1 mEq/L   Chloride 102  96 - 112 mEq/L   CO2 23  19 - 32 mEq/L   Glucose, Bld 104 (*) 70 - 99 mg/dL   BUN 12  6 - 23 mg/dL   Creatinine, Ser 1.61  0.50 - 1.35 mg/dL   Calcium 8.7  8.4 - 09.6 mg/dL   Total Protein 7.0  6.0 - 8.3 g/dL   Albumin 3.3 (*) 3.5 - 5.2 g/dL   AST 71 (*) 0 - 37 U/L   ALT 16  0 - 53 U/L   Alkaline Phosphatase 57  39 - 117 U/L   Total Bilirubin 0.6  0.3 - 1.2 mg/dL   GFR calc non Af Amer >90  >90 mL/min   GFR calc Af Amer >90  >90 mL/min   Comment:            The eGFR has been calculated     using the CKD EPI equation.     This calculation has not been     validated in all clinical     situations.     eGFR's persistently     <90 mL/min signify     possible Chronic Kidney Disease.  ETHANOL     Status: Abnormal   Collection Time    03/12/13  3:02 PM      Result Value Range   Alcohol, Ethyl (B) 224 (*) 0 - 11 mg/dL   Comment:            LOWEST DETECTABLE LIMIT FOR     SERUM ALCOHOL IS 11 mg/dL     FOR MEDICAL PURPOSES ONLY  URINALYSIS, ROUTINE W REFLEX MICROSCOPIC     Status: Abnormal   Collection Time    03/12/13  3:51 PM      Result Value Range   Color, Urine YELLOW  YELLOW   APPearance CLEAR  CLEAR   Specific Gravity, Urine 1.031 (*) 1.005 - 1.030   pH 6.0  5.0 - 8.0   Glucose, UA NEGATIVE  NEGATIVE mg/dL   Hgb urine dipstick LARGE (*) NEGATIVE   Bilirubin Urine NEGATIVE  NEGATIVE   Ketones, ur NEGATIVE  NEGATIVE mg/dL   Protein, ur  NEGATIVE  NEGATIVE mg/dL   Urobilinogen, UA 1.0  0.0 - 1.0 mg/dL   Nitrite NEGATIVE  NEGATIVE   Leukocytes, UA NEGATIVE  NEGATIVE  URINE RAPID DRUG SCREEN (HOSP PERFORMED)     Status: Abnormal   Collection Time    03/12/13  3:51 PM      Result Value Range   Opiates POSITIVE (*) NONE DETECTED   Cocaine POSITIVE (*) NONE DETECTED   Benzodiazepines NONE DETECTED  NONE DETECTED   Amphetamines NONE DETECTED  NONE DETECTED  Tetrahydrocannabinol NONE DETECTED  NONE DETECTED   Barbiturates NONE DETECTED  NONE DETECTED   Comment:            DRUG SCREEN FOR MEDICAL PURPOSES     ONLY.  IF CONFIRMATION IS NEEDED     FOR ANY PURPOSE, NOTIFY LAB     WITHIN 5 DAYS.                LOWEST DETECTABLE LIMITS     FOR URINE DRUG SCREEN     Drug Class       Cutoff (ng/mL)     Amphetamine      1000     Barbiturate      200     Benzodiazepine   200     Tricyclics       300     Opiates          300     Cocaine          300     THC              50  URINE MICROSCOPIC-ADD ON     Status: None   Collection Time    03/12/13  3:51 PM      Result Value Range   RBC / HPF 0-2  <3 RBC/hpf   Urine-Other MUCOUS PRESENT      Physical Findings: AIMS:  , ,  ,  ,    CIWA:  CIWA-Ar Total: 0 COWS:     Treatment Plan Summary: Daily contact with patient to assess and evaluate symptoms and progress in treatment Medication management  Plan:  Review of chart, vital signs, medications, and notes. 1-Individual and group therapy encouraged and attended 2-Medication management for substance abuse and pain:  Medications reviewed with the patient and he requested something for his back pain--Relafen and Robaxin ordered 3-Coping skills for substance abuse and pain 4-Continue crisis stabilization and management 5-Address health issues--monitoring blood pressures, trending down--monitoring continues 6-Treatment plan in progress to prevent relapse of substance abuse  Medical Decision Making Problem Points:   Established problem, stable/improving (1) and Review of psycho-social stressors (1) Data Points:  Review of medication regiment & side effects (2)  I certify that inpatient services furnished can reasonably be expected to improve the patient's condition.   Nanine Means, PMH-NP 03/14/2013, 1:34 PM

## 2013-03-14 NOTE — Progress Notes (Signed)
Date: 03/14/2013  Time: 15:15pm  Group Topic/Focus:  Making Healthy Choices: The focus of this group is to help patients identify negative/unhealthy choices they were using prior to admission and identify positive/healthier coping strategies to replace them upon discharge.  Participation Level: Active  Participation Quality: Appropriate, Sharing and Supportive  Affect: Appropriate  Cognitive: Appropriate  Insight: Appropriate  Engagement in Group: Engaged and Supportive  Modes of Intervention: Discussion, Education, Problem-solving and Support  Additional Comments: pt fell asleep during the end of the group. [name] [name] M  03/14/2013, 4:07 PM

## 2013-03-14 NOTE — Progress Notes (Signed)
D.  Pt pleasant and bright on approach, has had complaint of diarrhea today as well as some acid reflux.  Interacting appropriately within milieu.  Positive for evening AA group.  Denies SI/HI/hallucinations at this time.  A.  Support and encouragement offered, medication given as ordered for diarrhea  R.  Pt remains safe on unit, will continue to monitor.

## 2013-03-14 NOTE — Progress Notes (Signed)
Adult Psychoeducational Group Note  Date:  03/14/2013 Time:  1315  Group Topic/Focus:  Making Healthy Choices:   The focus of this group is to help patients identify negative/unhealthy choices they were using prior to admission and identify positive/healthier coping strategies to replace them upon discharge.  Participation Level:  Minimal  Participation Quality:  Attentive  Affect:  Appropriate and Flat  Cognitive:  Alert and Oriented  Insight: Improving  Engagement in Group:  Improving and Limited  Modes of Intervention:  Discussion  Additional Comments:  Did better in this group, more participation and listening.  Roselee Culver 03/14/2013, 5:10 PM

## 2013-03-14 NOTE — Progress Notes (Signed)
Adult Psychoeducational Group Note  Date:  03/14/2013 Time:  0930  Group Topic/Focus:  Self inventory sheets.  Participation Level:  Minimal  Participation Quality:  Appropriate  Affect:  Appropriate  Cognitive:  Appropriate  Insight: Appropriate  Engagement in Group:  Improving  Modes of Intervention:  Discussion  Additional Comments:  Did sit and lsiten in group but really did not participate in group. Tried to get into a discussion but not able to take it very far.  Roselee Culver 03/14/2013, 5:06 PM

## 2013-03-14 NOTE — Progress Notes (Signed)
Adult Psychoeducational Group Note  Date: 03/13/2013  Time: 11:00AM  Group Topic/Focus:  Healthy Communication: The focus of this group is to discuss communication, barriers to communication, as well as healthy ways to communicate with others.  Participation Level: Active  Participation Quality: Appropriate, Sharing and Supportive  Affect: Appropriate  Cognitive: Appropriate  Insight: Appropriate  Engagement in Group: Engaged and Supportive  Modes of Intervention: Education, Problem-solving and Support  Additional Comments: NONE  Sharee Sturdy M  03/14/2013, 1:05 PM    

## 2013-03-14 NOTE — Clinical Social Work Note (Signed)
[facility] Group Notes:  (Clinical Social Work)  03/14/2013  10:00-11:00AM  Summary of Progress/Problems:   The main focus of today's process group was to   identify the patient's current support system and decide on other supports that can be put in place to prevent future hospitalizations.  A handout was used to explain the 4 definitions/levels of support and to think about what support patient has given and received from others.  An emphasis was placed on using counselor, doctor, therapy groups, 12-step groups, and problem-specific support groups to expand supports. The patient expressed understanding of topic.  He acknowledged it is much easier to give help than to ask for it.  Type of Therapy:  Process Group with Motivational Interviewing  Participation Level:  Active  Participation Quality:  Appropriate, Attentive and Sharing  Affect:  Blunted  Cognitive:  Oriented  Insight:  Developing/Improving  Engagement in Therapy:  Developing/Improving  Modes of Intervention:  Clarification, Education, Limit-setting, Problem-solving, Socialization, Support and Processing, Exploration, Discussion, Role-Play   Ambrose Mantle, LCSW 03/14/2013, 10:48 AM

## 2013-03-15 DIAGNOSIS — F1994 Other psychoactive substance use, unspecified with psychoactive substance-induced mood disorder: Secondary | ICD-10-CM

## 2013-03-15 DIAGNOSIS — F1910 Other psychoactive substance abuse, uncomplicated: Secondary | ICD-10-CM

## 2013-03-15 NOTE — Progress Notes (Signed)
Provided brief support during recreation time.    Introduced spiritual care as resource.    Will continue to follow and assess for support. 

## 2013-03-15 NOTE — Progress Notes (Signed)
Patient ID: [name] [name], male   DOB: 1961-11-09, 52 y.o.   MRN: [id number] Patient requested CSW contact his worker at [employer], message left, Attorney Cephus Shelling and [address] Attorney's office. Spoke to [employer] and no answer at [employer] office.  Clide Dales 03/15/2013

## 2013-03-15 NOTE — Progress Notes (Signed)
Patient ID: [name] [name], male   DOB: 10-May-1961, 52 y.o.   MRN: [id number] [facility] MD Progress Note  03/15/2013 3:26 PM [name] [name]  MRN:  [id number]  Subjective: "My mood is so-so. I still got the shakes. Still having loose stools. I don't think that I'm ready to discharged yet. My mood is all right".     Diagnosis:   Axis I: Substance Abuse and Substance Induced Mood Disorder Axis II: Deferred Axis III:  Past Medical History  Diagnosis Date  . Bronchitis   . Acid reflux   . Pancreatitis     March 2013  . Gout   . Bipolar disorder   . Hyperlipidemia   . Alcohol abuse     6-8 cans of beer daily  . Pseudocyst of pancreas 02/28/2012  . Arthritis   . Fracture of lower leg   . Hypertension   . Chronic pain   . Chronic neck pain   . Chronic back pain   . Left arm pain     chronic   Axis IV: economic problems, other psychosocial or environmental problems, problems related to social environment and problems with primary support group Axis V: 41-50 serious symptoms  ADL's:  Fair  Sleep: Good  Appetite:  Good  Suicidal Ideation:  Denies Homicidal Ideation:  Denies  Psychiatric Specialty Exam: Review of Systems  Constitutional: Negative.   HENT: Negative.   Eyes: Negative.   Respiratory: Negative.   Cardiovascular: Negative.   Gastrointestinal: Negative.   Genitourinary: Negative.   Musculoskeletal: Positive for back pain.  Skin: Negative.   Neurological: Negative.   Endo/Heme/Allergies: Negative.   Psychiatric/Behavioral: Positive for depression and substance abuse. The patient is nervous/anxious.     Blood pressure 123/86, pulse 98, temperature 97.5 F (36.4 C), temperature source Oral, resp. rate 20, height 5' 8.11" (1.73 m), weight 81.647 kg (180 lb), SpO2 98.00%.Body mass index is 27.28 kg/(m^2).  General Appearance: Disheveled  Eye Solicitor::  Fair  Speech:  Normal Rate  Volume:  Normal  Mood:  So-so  Affect:  Apprpriate  Thought Process:  Coherent   Orientation:  Full (Time, Place, and Person)  Thought Content:  WDL, denies hallucinations  Suicidal Thoughts:  No  Homicidal Thoughts:  No  Memory:  Immediate;   Fair Recent;   Fair Remote;   Fair  Judgement:  Fair  Insight:  Fair  Psychomotor Activity:  Normal  Concentration:  Fair  Recall:  Fair  Akathisia:  No  Handed:  Right  AIMS (if indicated):     Assets:  Desire for Improvement Resilience Social Support  Sleep:  Number of Hours: 6.75   Current Medications: Current Facility-Administered Medications  Medication Dose Route Frequency Provider Last Rate Last Dose  . acetaminophen (TYLENOL) tablet 650 mg  650 mg Oral Q6H PRN Shuvon Rankin, NP   650 mg at 03/14/13 1159  . albuterol (PROVENTIL HFA;VENTOLIN HFA) 108 (90 BASE) MCG/ACT inhaler 2 puff  2 puff Inhalation Q4H PRN Shuvon Rankin, NP      . alum & mag hydroxide-simeth (MAALOX/MYLANTA) 200-200-20 MG/5ML suspension 30 mL  30 mL Oral Q4H PRN Shuvon Rankin, NP   30 mL at 03/14/13 0946  . amLODipine (NORVASC) tablet 5 mg  5 mg Oral Daily Sanjuana Kava, NP   5 mg at 03/15/13 0813  . calcium carbonate (TUMS - dosed in mg elemental calcium) chewable tablet 200 mg of elemental calcium  1 tablet Oral TID Nanine Means, NP   200 mg of elemental  calcium at 03/15/13 1203  . chlordiazePOXIDE (LIBRIUM) capsule 25 mg  25 mg Oral Q6H PRN Shuvon Rankin, NP   25 mg at 03/14/13 2204  . chlordiazePOXIDE (LIBRIUM) capsule 25 mg  25 mg Oral BH-qamhs Shuvon Rankin, NP   25 mg at 03/15/13 1610   Followed by  . [START ON 03/16/2013] chlordiazePOXIDE (LIBRIUM) capsule 25 mg  25 mg Oral Daily Shuvon Rankin, NP      . citalopram (CELEXA) tablet 10 mg  10 mg Oral Daily Shuvon Rankin, NP   10 mg at 03/15/13 0813  . gemfibrozil (LOPID) tablet 600 mg  600 mg Oral BID AC Shuvon Rankin, NP   600 mg at 03/15/13 0605  . guaiFENesin (MUCINEX) 12 hr tablet 600 mg  600 mg Oral BID Sanjuana Kava, NP   600 mg at 03/15/13 9604  . hydrOXYzine (ATARAX/VISTARIL)  tablet 25 mg  25 mg Oral Q6H PRN Shuvon Rankin, NP      . loperamide (IMODIUM) capsule 2-4 mg  2-4 mg Oral PRN Shuvon Rankin, NP   2 mg at 03/15/13 5409  . loratadine (CLARITIN) tablet 10 mg  10 mg Oral Daily Nanine Means, NP   10 mg at 03/15/13 8119  . magnesium hydroxide (MILK OF MAGNESIA) suspension 30 mL  30 mL Oral Daily PRN Shuvon Rankin, NP      . methocarbamol (ROBAXIN) tablet 500 mg  500 mg Oral TID Nanine Means, NP   500 mg at 03/15/13 1203  . multivitamin with minerals tablet 1 tablet  1 tablet Oral Daily Shuvon Rankin, NP   1 tablet at 03/15/13 0814  . nabumetone (RELAFEN) tablet 750 mg  750 mg Oral BID Nanine Means, NP   750 mg at 03/15/13 0814  . nicotine (NICODERM CQ - dosed in mg/24 hours) patch 21 mg  21 mg Transdermal Daily Nanine Means, NP   21 mg at 03/14/13 1454  . omega-3 acid ethyl esters (LOVAZA) capsule 1 g  1 g Oral Daily Shuvon Rankin, NP   1 g at 03/15/13 1478  . ondansetron (ZOFRAN-ODT) disintegrating tablet 4 mg  4 mg Oral Q6H PRN Shuvon Rankin, NP      . pantoprazole (PROTONIX) EC tablet 40 mg  40 mg Oral Daily Shuvon Rankin, NP   40 mg at 03/15/13 0812  . QUEtiapine (SEROQUEL) tablet 200 mg  200 mg Oral QHS Sanjuana Kava, NP   200 mg at 03/14/13 2203  . tamsulosin (FLOMAX) capsule 0.4 mg  0.4 mg Oral Daily Shuvon Rankin, NP   0.4 mg at 03/15/13 0813  . thiamine (B-1) injection 100 mg  100 mg Intramuscular Once Shuvon Rankin, NP      . thiamine (VITAMIN B-1) tablet 100 mg  100 mg Oral Daily Shuvon Rankin, NP   100 mg at 03/15/13 0812  . traZODone (DESYREL) tablet 100 mg  100 mg Oral QHS PRN Shuvon Rankin, NP   100 mg at 03/14/13 2203    Lab Results:  No results found for this or any previous visit (from the past 48 hour(s)).  Physical Findings: AIMS:  , ,  ,  ,    CIWA:  CIWA-Ar Total: 3 COWS:     Treatment Plan Summary: Daily contact with patient to assess and evaluate symptoms and progress in treatment Medication management  Supportive approach/coping  skills/relapse prevention. Encouraged out of room, participation in group sessions and application of coping skills when distressed. Will continue to monitor response to/adverse effects of medications in  use to assure effectiveness. Continue to monitor mood, behavior and interaction with staff and other patients. Continue current plan of care.  Medical Decision Making Problem Points:  Established problem, stable/improving (1) and Review of psycho-social stressors (1) Data Points:  Review of medication regiment & side effects (2)  I certify that inpatient services furnished can reasonably be expected to improve the patient's condition.   Armandina Stammer I, PMH-NP 03/15/2013, 3:26 PM

## 2013-03-15 NOTE — Tx Team (Signed)
Interdisciplinary Treatment Plan Update (Adult)  Date: 03/15/2013  Time Reviewed:10:10 AM   Progress in Treatment: Attending groups: Yes Participating in groups: Yes Taking medication as prescribed:  Yes Tolerating medication:  Yes Family/Significant othe contact made: Not as yet Patient understands diagnosis: Yes Discussing patient identified problems/goals with staff: Yes Medical problems stabilized or resolved: Yes Denies suicidal/homicidal ideation: Yes Patient has not harmed self or Others: Yes  New problem(s) identified: None Identified  Discharge Plan or Barriers:  CSW is assessing for appropriate referrals.   Additional comments: N/A  Reason for Continuation of Hospitalization: Depression Withdrawal symptoms   Estimated length of stay: 2-3 days  For review of initial/current patient goals, please see plan of care.  Attendees: Patient:     Family:     Physician:  NP Serena Colonel 03/15/2013 10:10AM   Nursing:   Roswell Miners, RN 03/15/2013 10:10 AM   Clinical Social Worker Ronda Fairly 03/15/2013 10:10 AM   Other:  Stephan Minister Liason 03/15/2013 10:10 AM   Other:  Robbie Louis, RN 03/15/2013 10:10 AM   Other:   03/15/2013   Other:   03/15/2013     Scribe for Treatment Team:   Carney Bern, LCSWA  03/15/2013 10:10 AM

## 2013-03-15 NOTE — Progress Notes (Signed)
[facility] LCSW Group Therapy  03/15/2013  1:15 PM  Type of Therapy:  Group Therapy 1:15 to 2:30 PM  Participation Level:  Minimal  Participation Quality:  Sharing yet later went to sleep  Affect:  Anxious, Appropriate, Depressed, Flat, Irritable and Tearful  Cognitive:  Alert, Appropriate, Oriented and Lacking  Insight: Limited  Engagement in Therapy:   Limited   Modes of Intervention:  Clarification, Discussion, Exploration, Problem-solving, Socialization and Support  Summary of Progress/Problems:  Group discussion focused on what patient's see as their own obstacles to recovery.  Patient shares belief that waiting for admit to treatment will be difficult to deal with due to past experience.  Patient was at [facility] and made it about a week into program yet had to leave due to medical problem and promptly relapsed. Patient became drowsy and was asleep the last part of group.  [name] [name]

## 2013-03-15 NOTE — Progress Notes (Signed)
Pt attended AA group, however, appeared to be sleeping.

## 2013-03-15 NOTE — Progress Notes (Signed)
[facility] LCSW Aftercare Discharge Planning Group Note  03/15/2013 8:45 AM  Participation Quality:  Did Not Attend.    Clide Dales

## 2013-03-15 NOTE — Progress Notes (Signed)
Patient ID: [name] [name], male   DOB: Jan 30, 1961, 52 y.o.   MRN: [id number] Faxed referral to [facility] for consideration for admit.  Patient reports he was there in early February 2014 and had to leave midway through treatment due to medical issue and since relapsed.  Hopes to return.  Carney Bern, LCSWA  03/15/2013
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
## 2013-03-16 NOTE — Progress Notes (Signed)
Patient ID: [name] [name], male   DOB: 1961-04-11, 52 y.o.   MRN: [id number] Patient ID: [name] [name], male   DOB: [birth year], 52 y.o.   MRN: [id number] [facility] MD Progress Note  03/16/2013 4:04 PM [name] [name]  MRN:  [id number]  Subjective: "My mood is improving some, still feeling the shakes some. The social worker told me that she is looking at sending me to [facility]. But, I need help getting to my orthopedic appointment in am 03/17/13 at 10:30 am". I'm hoping that I will get to [facility] as indicated.  Diagnosis:   Axis I: Substance Abuse and Substance Induced Mood Disorder Axis II: Deferred Axis III:  Past Medical History  Diagnosis Date  . Bronchitis   . Acid reflux   . Pancreatitis     March 2013  . Gout   . Bipolar disorder   . Hyperlipidemia   . Alcohol abuse     6-8 cans of beer daily  . Pseudocyst of pancreas 02/28/2012  . Arthritis   . Fracture of lower leg   . Hypertension   . Chronic pain   . Chronic neck pain   . Chronic back pain   . Left arm pain     chronic   Axis IV: economic problems, other psychosocial or environmental problems, problems related to social environment and problems with primary support group Axis V: 41-50 serious symptoms  ADL's:  Fair  Sleep: Good  Appetite:  Good  Suicidal Ideation:  Denies Homicidal Ideation:  Denies  Psychiatric Specialty Exam: Review of Systems  Constitutional: Negative.   HENT: Negative.   Eyes: Negative.   Respiratory: Negative.   Cardiovascular: Negative.   Gastrointestinal: Negative.   Genitourinary: Negative.   Musculoskeletal: Positive for back pain.  Skin: Negative.   Neurological: Negative.   Endo/Heme/Allergies: Negative.   Psychiatric/Behavioral: Positive for depression and substance abuse. The patient is nervous/anxious.     Blood pressure 114/79, pulse 97, temperature 98.2 F (36.8 C), temperature source Oral, resp. rate 20, height 5' 8.11" (1.73 m), weight 81.647 kg (180 lb), SpO2 98.00%.Body mass index  is 27.28 kg/(m^2).  General Appearance: Disheveled  Eye Solicitor::  Fair  Speech:  Normal Rate  Volume:  Normal  Mood:  So-so  Affect:  Apprpriate  Thought Process:  Coherent  Orientation:  Full (Time, Place, and Person)  Thought Content:  WDL, denies hallucinations  Suicidal Thoughts:  No  Homicidal Thoughts:  No  Memory:  Immediate;   Fair Recent;   Fair Remote;   Fair  Judgement:  Fair  Insight:  Fair  Psychomotor Activity:  Normal  Concentration:  Fair  Recall:  Fair  Akathisia:  No  Handed:  Right  AIMS (if indicated):     Assets:  Desire for Improvement Resilience Social Support  Sleep:  Number of Hours: 4.75   Current Medications: Current Facility-Administered Medications  Medication Dose Route Frequency Provider Last Rate Last Dose  . acetaminophen (TYLENOL) tablet 650 mg  650 mg Oral Q6H PRN Shuvon Rankin, NP   650 mg at 03/15/13 2208  . albuterol (PROVENTIL HFA;VENTOLIN HFA) 108 (90 BASE) MCG/ACT inhaler 2 puff  2 puff Inhalation Q4H PRN Shuvon Rankin, NP      . alum & mag hydroxide-simeth (MAALOX/MYLANTA) 200-200-20 MG/5ML suspension 30 mL  30 mL Oral Q4H PRN Shuvon Rankin, NP   30 mL at 03/16/13 0656  . amLODipine (NORVASC) tablet 5 mg  5 mg Oral Daily Sanjuana Kava, NP   5 mg at  03/16/13 0818  . calcium carbonate (TUMS - dosed in mg elemental calcium) chewable tablet 200 mg of elemental calcium  1 tablet Oral TID Nanine Means, NP   200 mg of elemental calcium at 03/16/13 1152  . citalopram (CELEXA) tablet 10 mg  10 mg Oral Daily Shuvon Rankin, NP   10 mg at 03/16/13 0818  . gemfibrozil (LOPID) tablet 600 mg  600 mg Oral BID AC Shuvon Rankin, NP   600 mg at 03/16/13 0700  . guaiFENesin (MUCINEX) 12 hr tablet 600 mg  600 mg Oral BID Sanjuana Kava, NP   600 mg at 03/16/13 0818  . loratadine (CLARITIN) tablet 10 mg  10 mg Oral Daily Nanine Means, NP   10 mg at 03/16/13 0817  . magnesium hydroxide (MILK OF MAGNESIA) suspension 30 mL  30 mL Oral Daily PRN Shuvon Rankin,  NP      . methocarbamol (ROBAXIN) tablet 500 mg  500 mg Oral TID Nanine Means, NP   500 mg at 03/16/13 1152  . multivitamin with minerals tablet 1 tablet  1 tablet Oral Daily Shuvon Rankin, NP   1 tablet at 03/16/13 0818  . nabumetone (RELAFEN) tablet 750 mg  750 mg Oral BID Nanine Means, NP   750 mg at 03/16/13 0817  . nicotine (NICODERM CQ - dosed in mg/24 hours) patch 21 mg  21 mg Transdermal Daily Nanine Means, NP   21 mg at 03/16/13 0646  . omega-3 acid ethyl esters (LOVAZA) capsule 1 g  1 g Oral Daily Shuvon Rankin, NP   1 g at 03/16/13 0817  . pantoprazole (PROTONIX) EC tablet 40 mg  40 mg Oral Daily Shuvon Rankin, NP   40 mg at 03/16/13 0818  . QUEtiapine (SEROQUEL) tablet 200 mg  200 mg Oral QHS Sanjuana Kava, NP   200 mg at 03/15/13 2207  . tamsulosin (FLOMAX) capsule 0.4 mg  0.4 mg Oral Daily Shuvon Rankin, NP   0.4 mg at 03/16/13 0818  . thiamine (B-1) injection 100 mg  100 mg Intramuscular Once Shuvon Rankin, NP      . thiamine (VITAMIN B-1) tablet 100 mg  100 mg Oral Daily Shuvon Rankin, NP   100 mg at 03/16/13 0818  . traZODone (DESYREL) tablet 100 mg  100 mg Oral QHS PRN Shuvon Rankin, NP   100 mg at 03/15/13 2207    Lab Results:  No results found for this or any previous visit (from the past 48 hour(s)).  Physical Findings: AIMS: Facial and Oral Movements Muscles of Facial Expression: None, normal Lips and Perioral Area: None, normal Jaw: None, normal Tongue: None, normal,Extremity Movements Upper (arms, wrists, hands, fingers): None, normal Lower (legs, knees, ankles, toes): None, normal, Trunk Movements Neck, shoulders, hips: None, normal, Overall Severity Severity of abnormal movements (highest score from questions above): None, normal Incapacitation due to abnormal movements: None, normal Patient's awareness of abnormal movements (rate only patient's report): No Awareness,    CIWA:  CIWA-Ar Total: 3 COWS:     Treatment Plan Summary: Daily contact with patient to  assess and evaluate symptoms and progress in treatment Medication management  Plan: Supportive approach/coping skills/relapse prevention. Encouraged out of room, participation in group sessions and application of coping skills when distressed. Will continue to monitor response to/adverse effects of medications in use to assure effectiveness. Continue to monitor mood, behavior and interaction with staff and other patients. Continue current plan of care.  Medical Decision Making Problem Points:  Established problem, stable/improving (  1) and Review of psycho-social stressors (1) Data Points:  Review of medication regiment & side effects (2)  I certify that inpatient services furnished can reasonably be expected to improve the patient's condition.   Armandina Stammer I, PMH-NP 03/16/2013, 4:04 PM

## 2013-03-16 NOTE — Progress Notes (Signed)
[facility] LCSW Aftercare Discharge Planning Group Note  03/16/2013 8:45 AM  Participation Quality:  Appropriate  Affect:  Depressed  Cognitive:  Alert and Oriented  Insight:  Limited  Engagement in Group:  Intrusive at times  Modes of Intervention:  Clarification, Limit-setting, Rapport Building and Support  Summary of Progress/Problems:  Pt denies both suicidal and homicidal ideation.  On a scale of 1 to 10 with ten being the most ever experienced, the patient rates depression at a 3 and anxiety at a 3. CSW reported that referral to [facility] and requested letters to both attorney and [employer] had all been faxed and call was made to [facility] of Court per patient's request.  Patient requested copies of all. (CSW later provided and informed patient that [employer] would meet to make decision on patient possibly returning.)    [name] [name] 03/16/2013, 6:45 PM

## 2013-03-16 NOTE — Progress Notes (Signed)
Recreation Therapy Notes  Date: 03.25.2014  Time: 3:00pm  Location: [address] Day Room   Group Topic/Focus: Goal Setting   Participation Level:  Active   Participation Quality:  Appropriate   Affect:  Euthymic   Cognitive:  Appropriate   Additional Comments: Patient with peer played "Joined at the Hip." Patient and peer set goal of walking to 2nd line on 300 hallway floor. Patient and peer reached this goal. Patient and peer set second goal of walking to the tree on the wall. Patient with peer reached this goal. Patient was asked to leave session by PA. Patient did not return to session.    Marykay Lex Senovia Gauer, LRT/CTRS   Jearl Klinefelter 03/16/2013 5:17 PM

## 2013-03-16 NOTE — Progress Notes (Signed)
[facility] LCSW Group Therapy  03/16/2013 1:15 PM  Type of Therapy:  Group Therapy 1:15 to 2:30   Participation Level: None; patient slept during group to point of snoring.  Clide Dales 03/16/2013,4:01 PM

## 2013-03-16 NOTE — Progress Notes (Signed)
Pt attended AA group but appeared to be sleeping.

## 2013-03-16 NOTE — Progress Notes (Signed)
Patient ID: [name] [name], male   DOB: [birth year], 52 y.o.   MRN: [id number]   D: When asked about concerns or questions. Pt stated "as long as I get my pain med tonight." Stated he's set discharge on the 27th to [facility].  Pt states he attends groups but he tends to fall asleep while he's in there.  Pt also spoke of a brace that his fiance/wife brought in for him to wear on his left foot. Informed pt the writer would look into orders.  A:  Support and encouragement was offered. 15 min checks continued for safety.  R: Pt remains safe.

## 2013-03-17 MED ORDER — METHOCARBAMOL 500 MG PO TABS
500.0000 mg | ORAL_TABLET | ORAL | Status: DC
  Administered 2013-03-17 – 2013-03-19 (×6): 500 mg via ORAL
  Filled 2013-03-17: qty 12
  Filled 2013-03-17 (×2): qty 1
  Filled 2013-03-17: qty 12
  Filled 2013-03-17 (×4): qty 1
  Filled 2013-03-17: qty 12
  Filled 2013-03-17 (×3): qty 1

## 2013-03-17 NOTE — Progress Notes (Signed)
[facility] LCSW Aftercare Discharge Planning Group Note   03/17/2013 8:45 AM  Participation Quality: Minimal  Affect: Drowzy  Cognitive: Oriented  Insight: Limited  Engagement in Group:  Minimal  Modes of Intervention:  Exploration, limit setting, clarification, and discussion of options  Summary of Progress/Problems:  Pt denies both suicidal and homicidal ideation.  On a scale of 1 to 10 with ten being the most ever experienced, the patient rates depression at a 3 and anxiety at a 3. Patient continues to report interest in [facility] as we await decision by administrative board. Back up plan is [facility]. Patient reports he will be going off premises for orthopeadic appointment this am. Reports frustration as CSW did not contact pain clinic for him to reschedule appointment. CSW explained again that we recommend alternative pain therapies verses pain medication.   Clide Dales

## 2013-03-17 NOTE — Progress Notes (Signed)
Pt has not been up for groups today.  He did go out for his appointment with [facility] via security this morning.  He is focused on going to [facility] from here.  He wanted to speak with some one in administration so E. Kaplan RN [facility] did speak with pt and his concerns of going to [facility] from here in a sheriff's car "in shackles" Pt was told if he could find his own way down there then he wouldn't have to go via sheriff.  Pt doesn't even have a bed anywhere yet.  He has been focused on his pain medication and wanting to have 4 times a day.  NP allowed pt to have his rescheduled to 0800 [id number] 2200 which pt was in agreement.  He denies any S/H ideation or A/V hallucinations.

## 2013-03-17 NOTE — Progress Notes (Signed)
Patient ID: [name] [name], male   DOB: [birth year], 52 y.o.   MRN: [id number] Patient ID: [name] [name], male   DOB: 10-09-1961, 52 y.o.   MRN: [id number] Patient ID: [name] [name], male   DOB: 1961/02/25, 52 y.o.   MRN: [id number] [facility] MD Progress Note  03/17/2013 4:47 PM Kendle Erker  MRN:  [id number]  Subjective: "Got bad news today. The SW told me that I will not be going to [facility]. I feel  Disappointed. I need to know my plan B. I need the help. I ain't too happy today. The Orthopedic told me that my leg is not healing well. He wants me to wear the brace. I got the brace with me here. I got the ace bandage on my legs today".  Diagnosis:   Axis I: Substance Abuse and Substance Induced Mood Disorder Axis II: Deferred Axis III:  Past Medical History  Diagnosis Date  . Bronchitis   . Acid reflux   . Pancreatitis     March 2013  . Gout   . Bipolar disorder   . Hyperlipidemia   . Alcohol abuse     6-8 cans of beer daily  . Pseudocyst of pancreas 02/28/2012  . Arthritis   . Fracture of lower leg   . Hypertension   . Chronic pain   . Chronic neck pain   . Chronic back pain   . Left arm pain     chronic   Axis IV: economic problems, other psychosocial or environmental problems, problems related to social environment and problems with primary support group Axis V: 41-50 serious symptoms  ADL's:  Fair  Sleep: Good  Appetite:  Good  Suicidal Ideation:  Denies Homicidal Ideation:  Denies  Psychiatric Specialty Exam: Review of Systems  Constitutional: Negative.   HENT: Negative.   Eyes: Negative.   Respiratory: Negative.   Cardiovascular: Negative.   Gastrointestinal: Negative.   Genitourinary: Negative.   Musculoskeletal: Positive for back pain.  Skin: Negative.   Neurological: Negative.   Endo/Heme/Allergies: Negative.   Psychiatric/Behavioral: Positive for depression and substance abuse. The patient is nervous/anxious.     Blood pressure 105/72, pulse 91, temperature 98.7 F  (37.1 C), temperature source Oral, resp. rate 16, height 5' 8.11" (1.73 m), weight 81.647 kg (180 lb), SpO2 98.00%.Body mass index is 27.28 kg/(m^2).  General Appearance: Disheveled  Eye Solicitor::  Fair  Speech:  Normal Rate  Volume:  Normal  Mood:  So-so  Affect:  Apprpriate  Thought Process:  Coherent  Orientation:  Full (Time, Place, and Person)  Thought Content:  WDL, denies hallucinations  Suicidal Thoughts:  No  Homicidal Thoughts:  No  Memory:  Immediate;   Fair Recent;   Fair Remote;   Fair  Judgement:  Fair  Insight:  Fair  Psychomotor Activity:  Normal  Concentration:  Fair  Recall:  Fair  Akathisia:  No  Handed:  Right  AIMS (if indicated):     Assets:  Desire for Improvement Resilience Social Support  Sleep:  Number of Hours: 6   Current Medications: Current Facility-Administered Medications  Medication Dose Route Frequency Provider Last Rate Last Dose  . acetaminophen (TYLENOL) tablet 650 mg  650 mg Oral Q6H PRN Shuvon Rankin, NP   650 mg at 03/15/13 2208  . albuterol (PROVENTIL HFA;VENTOLIN HFA) 108 (90 BASE) MCG/ACT inhaler 2 puff  2 puff Inhalation Q4H PRN Shuvon Rankin, NP      . alum & mag hydroxide-simeth (MAALOX/MYLANTA) 200-200-20 MG/5ML suspension 30 mL  30 mL Oral Q4H PRN Shuvon Rankin, NP   30 mL at 03/16/13 0656  . amLODipine (NORVASC) tablet 5 mg  5 mg Oral Daily Sanjuana Kava, NP   5 mg at 03/17/13 0825  . calcium carbonate (TUMS - dosed in mg elemental calcium) chewable tablet 200 mg of elemental calcium  1 tablet Oral TID Nanine Means, NP   200 mg of elemental calcium at 03/17/13 0654  . citalopram (CELEXA) tablet 10 mg  10 mg Oral Daily Shuvon Rankin, NP   10 mg at 03/17/13 0825  . gemfibrozil (LOPID) tablet 600 mg  600 mg Oral BID AC Shuvon Rankin, NP   600 mg at 03/17/13 0533  . guaiFENesin (MUCINEX) 12 hr tablet 600 mg  600 mg Oral BID Sanjuana Kava, NP   600 mg at 03/17/13 0824  . loratadine (CLARITIN) tablet 10 mg  10 mg Oral Daily Nanine Means, NP   10 mg at 03/17/13 0825  . magnesium hydroxide (MILK OF MAGNESIA) suspension 30 mL  30 mL Oral Daily PRN Shuvon Rankin, NP      . methocarbamol (ROBAXIN) tablet 500 mg  500 mg Oral [id number] Rachael Fee, MD   500 mg at 03/17/13 1504  . multivitamin with minerals tablet 1 tablet  1 tablet Oral Daily Shuvon Rankin, NP   1 tablet at 03/17/13 0824  . nabumetone (RELAFEN) tablet 750 mg  750 mg Oral BID Nanine Means, NP   750 mg at 03/17/13 0825  . nicotine (NICODERM CQ - dosed in mg/24 hours) patch 21 mg  21 mg Transdermal Daily Nanine Means, NP   21 mg at 03/17/13 0534  . omega-3 acid ethyl esters (LOVAZA) capsule 1 g  1 g Oral Daily Shuvon Rankin, NP   1 g at 03/17/13 0824  . pantoprazole (PROTONIX) EC tablet 40 mg  40 mg Oral Daily Shuvon Rankin, NP   40 mg at 03/17/13 0824  . QUEtiapine (SEROQUEL) tablet 200 mg  200 mg Oral QHS Sanjuana Kava, NP   200 mg at 03/16/13 2220  . tamsulosin (FLOMAX) capsule 0.4 mg  0.4 mg Oral Daily Shuvon Rankin, NP   0.4 mg at 03/17/13 0824  . thiamine (B-1) injection 100 mg  100 mg Intramuscular Once Shuvon Rankin, NP      . thiamine (VITAMIN B-1) tablet 100 mg  100 mg Oral Daily Shuvon Rankin, NP   100 mg at 03/17/13 0824  . traZODone (DESYREL) tablet 100 mg  100 mg Oral QHS PRN Shuvon Rankin, NP   100 mg at 03/15/13 2207    Lab Results:  No results found for this or any previous visit (from the past 48 hour(s)).  Physical Findings: AIMS: Facial and Oral Movements Muscles of Facial Expression: None, normal Lips and Perioral Area: None, normal Jaw: None, normal Tongue: None, normal,Extremity Movements Upper (arms, wrists, hands, fingers): None, normal Lower (legs, knees, ankles, toes): None, normal, Trunk Movements Neck, shoulders, hips: None, normal, Overall Severity Severity of abnormal movements (highest score from questions above): None, normal Incapacitation due to abnormal movements: None, normal Patient's awareness of abnormal movements  (rate only patient's report): No Awareness,    CIWA:  CIWA-Ar Total: 3 COWS:     Treatment Plan Summary: Daily contact with patient to assess and evaluate symptoms and progress in treatment Medication management  Plan: Supportive approach/coping skills/relapse prevention. Encouraged out of room, participation in group sessions and application of coping skills when distressed. Will continue to monitor  response to/adverse effects of medications in use to assure effectiveness. Continue to monitor mood, behavior and interaction with staff and other patients. Had an orthopedic appointment today. Allow patient to wear his leg brace for leg support. Continue current plan of care.  Medical Decision Making Problem Points:  Established problem, stable/improving (1) and Review of psycho-social stressors (1) Data Points:  Review of medication regiment & side effects (2)  I certify that inpatient services furnished can reasonably be expected to improve the patient's condition.   Armandina Stammer I, PMH-NP 03/17/2013, 4:47 PM

## 2013-03-17 NOTE — Progress Notes (Deleted)
Adult Psychoeducational Group Note  Date:  03/17/2013 Time:  11:45 AM  Group Topic/Focus:  Coping With Mental Health Crisis:   The purpose of this group is to help patients identify strategies for coping with mental health crisis.  Group discusses possible causes of crisis and ways to manage them effectively.  Participation Level:  Active  Participation Quality:  Appropriate, Attentive and Sharing  Affect:  Appropriate  Cognitive:  Alert and Appropriate  Insight: Appropriate  Engagement in Group:  Engaged  Modes of Intervention:  Discussion  Additional Comments:  Pt was appropriate and attentive while attending group. Pt stated that crisis means that their is a problem and he needs help. Pt also stated that he feel that if he stop being selfish and focus on his daughter the chances of a crisis will decrease.   Sharyn Lull 03/17/2013, 11:45 AM

## 2013-03-17 NOTE — Progress Notes (Signed)
Psychoeducational Group Note  Date:  03/17/2013 Time:  1100  Group Topic/Focus:  Coping With Mental Health Crisis:   The purpose of this group is to help patients identify strategies for coping with mental health crisis.  Group discusses possible causes of crisis and ways to manage them effectively.  Participation Level: Did Not Attend  Participation Quality:  Not Applicable  Affect:  Not Applicable  Cognitive:  Not Applicable  Insight:  Not Applicable  Engagement in Group: Not Applicable  Additional Comments:  Pt did not attend group.   Sharyn Lull 03/17/2013, 12:08 PM

## 2013-03-18 MED ORDER — MENTHOL 3 MG MT LOZG
1.0000 | LOZENGE | OROMUCOSAL | Status: DC | PRN
  Administered 2013-03-18: 3 mg via ORAL

## 2013-03-18 NOTE — Progress Notes (Signed)
[facility] LCSW Group Therapy - Late Entry  03/17/2013 1:15 PM  Type of Therapy: Group Therapy 1:15 to 2:30 PM  Participation Level: None  Participation Quality: Distracting as he was asleep and snoring  Affect: Irritable  Cognitive: Oriented  Insight:None shared  Engagement in Therapy:  None  Modes of Intervention: Limit setting  Summary of Progress/Problems: The focus of this group session was to process how we deal with difficult emotions and share with others the patterns that play out when we are reacting to the emotion verses the situation.  As patient was asleep and snoring, facilitator asked him to go to his room.   [name] [name]

## 2013-03-18 NOTE — Progress Notes (Signed)
Recreation Therapy Notes  Date: 03.27.2014 Time: 3:00pm Location: [address] Day Room      Group Topic/Focus: Leisure Education  Participation Level: Active  Participation Quality: Appropriate  Affect: Euthymic  Cognitive: Oriented   Additional Comments: Patients were asked to create a 20 item leisure and recreation "Bucket List." Patients were asked to read list aloud to peers. For each activity patient had in common with peers she was asked to take a poker chip. Patient was able to identify 1 and recreation activity for his "Bucket List." Patient actively participated in group activity. Patient stepped out of session at approximately 3:25pm. Patient returned as group wrap up discussion was ending.   Marykay Lex Lynnex Fulp, LRT/CTRS   Jearl Klinefelter 03/18/2013 4:32 PM

## 2013-03-18 NOTE — Progress Notes (Signed)
[facility] LCSW Aftercare Discharge Planning Group Note  03/18/2013 8:45 AM  Participation Quality:  Appropriate and Sharing  Affect:  Appropriate  Cognitive:  Alert and Oriented  Insight:  Developing/Improving  Engagement in Group:  Developing  Modes of Intervention:  Clarification, Discussion, Exploration, Socialization and Support  Summary of Progress/Problems:  Pt denies both suicidal and homicidal ideation.  On a scale of 1 to 10 with ten being the most ever experienced, the patient rates depression at a 4 and anxiety at a 4. Patient appreciative of referral to [facility] and reports sister can take him on weekends.  Patient understands that he is likely to transfer on a weekday and is agreeable to sheriff transportation plan.    [name] [name]

## 2013-03-18 NOTE — Progress Notes (Signed)
D:  Patient's self inventory sheet, patient needs sleep medication, has improving appetite, low energy level, improving attention span.  Rated depression, hopelessness, anxiety #4.  Has experienced diarrhea and cravings in past 24 hours.  Denied SI.  Has had physical problems in past 124 hours with hands, left leg, right foot, back, hip.  Worst pain #8.5.  After discharge, plans to not drink/get high, will go to meetings, meet new people.  Wears right wrist brace, left leg boot.  Denied wrist pain.  Denied HI.  Saw spots yesterday but not today.  Denied pain.  Wants increase in his medications.  Does have discharge plans.  No problems taking meds after discharge. A:  Scheduled medications administered per MD orders.  Staff will continue to monitor patient for safety every 15 minutes.  Emotional support and encouragement given to patient. R:  Denied SI and HI.   Denied A/V hallucinations this morning.  Patient remains safe on unit. Patient continues to wear right wrist brace, can move fingers.  Wears left leg boot, ambulates without difficulty.

## 2013-03-18 NOTE — Progress Notes (Signed)
Patient ID: [name] [name], male   DOB: [birth year], 52 y.o.   MRN: [id number] D: Pt. Visible on the unit in dayroom and on phone. Pt. Has brace on right hand, but able to move fingers. Pt. Also says he has a brace that goes on his left leg but didn't have all the pieces to it. Pt. 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 Pt. Says he doesn't want to go home from here but to a long term facility. "want to go to [facility] but they won't take me" A: Writer introduced self to client and discussed goals and discharge.  Staff will monitor q44min for safety. Pt. Encouraged to attend group.

## 2013-03-18 NOTE — Progress Notes (Signed)
[facility] LCSW Group Therapy  03/18/2013 1:15 PM  Type of Therapy:  Group Therapy 1:15 top 2:30 PM  Participation Level:  Active  Participation Quality:  Appropriate  Affect:  Appropriate  Cognitive:  Alert, Appropriate and Oriented  Insight:   Engaged   Engagement in Therapy:   Engaged  Modes of Intervention: Discussion, Exploration, Rapport Building, Socialization and Support  Summary of Progress/Problems: Focus of group processing discussion was on balance in life; the components in life which have a negative influence on balance and the components which make for a more balanced life.  Patient shared that he has had too much negativity and not enough of things he enjoyed.  Patient spoke fondly of how he enjoys being outside and fishing especially.  Patient described well what being in the moment was as opposed to rumination or projecting, thus was great example for group.  Kavaughn seemed pleased with his participation today.   Clide Dales

## 2013-03-18 NOTE — Progress Notes (Signed)
Patient ID: [name] [name], male   DOB: 08/16/1961, 52 y.o.   MRN: [id number] D: pt. Reports depression at "4" of 10. Pt. Visible on the unit interacting in day room and on the phone. Pt. Has boot on left leg and brace on right arm, moving independently.  A: Pt. Will be monitored q36min for safety. Pt. Encouraged to attend karaoke. R: Pt. Is safe on the unit. Pt. Attends karaoke and interacts.

## 2013-03-18 NOTE — Care Management Utilization Note (Signed)
   Per State Regulation 482.30  This chart was reviewed for necessity with respect to the patient's Admission/ Duration of stay.  Next review date: 03/22/13  Nicolasa Ducking RN, BSN

## 2013-03-18 NOTE — Progress Notes (Signed)
Patient ID: [name] [name], male   DOB: Sep 05, 1961, 52 y.o.   MRN: [id number] [facility] MD Progress Note  03/18/2013 6:13 PM [name] [name]  MRN:  [id number]  Subjective: "Pretty good today." "i'm upset about having to go in handcuffs and shackled to this rehab place." Objective: Patient is more concerned about appearances and riding in a police car than his sobriety. Diagnosis:   Axis I: Substance Abuse and Substance Induced Mood Disorder Axis II: Deferred Axis III:  Past Medical History  Diagnosis Date  . Bronchitis   . Acid reflux   . Pancreatitis     March 2013  . Gout   . Bipolar disorder   . Hyperlipidemia   . Alcohol abuse     6-8 cans of beer daily  . Pseudocyst of pancreas 02/28/2012  . Arthritis   . Fracture of lower leg   . Hypertension   . Chronic pain   . Chronic neck pain   . Chronic back pain   . Left arm pain     chronic   Axis IV: economic problems, other psychosocial or environmental problems, problems related to social environment and problems with primary support group Axis V: 41-50 serious symptoms  ADL's:  Fair  Sleep: Good  Appetite:  Good  Suicidal Ideation:  Denies Homicidal Ideation:  Denies  Psychiatric Specialty Exam: Review of Systems  Constitutional: Negative.   HENT: Negative.   Eyes: Negative.   Respiratory: Negative.   Cardiovascular: Negative.   Gastrointestinal: Negative.   Genitourinary: Negative.   Musculoskeletal: Positive for back pain.  Skin: Negative.   Neurological: Negative.   Endo/Heme/Allergies: Negative.   Psychiatric/Behavioral: Positive for depression and substance abuse. The patient is nervous/anxious.     Blood pressure 122/80, pulse 80, temperature 98.1 F (36.7 C), temperature source Oral, resp. rate 20, height 5' 8.11" (1.73 m), weight 81.647 kg (180 lb), SpO2 98.00%.Body mass index is 27.28 kg/(m^2).  General Appearance: Disheveled  Eye Solicitor::  Fair  Speech:  Normal Rate  Volume:  Normal  Mood:  So-so   Affect:  Apprpriate  Thought Process:  Coherent  Orientation:  Full (Time, Place, and Person)  Thought Content:  WDL, denies hallucinations  Suicidal Thoughts:  No  Homicidal Thoughts:  No  Memory:  Immediate;   Fair Recent;   Fair Remote;   Fair  Judgement:  Fair  Insight:  Fair  Psychomotor Activity:  Normal  Concentration:  Fair  Recall:  Fair  Akathisia:  No  Handed:  Right  AIMS (if indicated):     Assets:  Desire for Improvement Resilience Social Support  Sleep:  Number of Hours: 6.5   Current Medications: Current Facility-Administered Medications  Medication Dose Route Frequency Provider Last Rate Last Dose  . acetaminophen (TYLENOL) tablet 650 mg  650 mg Oral Q6H PRN Shuvon Rankin, NP   650 mg at 03/15/13 2208  . albuterol (PROVENTIL HFA;VENTOLIN HFA) 108 (90 BASE) MCG/ACT inhaler 2 puff  2 puff Inhalation Q4H PRN Shuvon Rankin, NP   2 puff at 03/18/13 0904  . alum & mag hydroxide-simeth (MAALOX/MYLANTA) 200-200-20 MG/5ML suspension 30 mL  30 mL Oral Q4H PRN Shuvon Rankin, NP   30 mL at 03/16/13 0656  . amLODipine (NORVASC) tablet 5 mg  5 mg Oral Daily Sanjuana Kava, NP   5 mg at 03/18/13 0817  . calcium carbonate (TUMS - dosed in mg elemental calcium) chewable tablet 200 mg of elemental calcium  1 tablet Oral TID Nanine Means, NP  200 mg of elemental calcium at 03/18/13 1723  . citalopram (CELEXA) tablet 10 mg  10 mg Oral Daily Shuvon Rankin, NP   10 mg at 03/18/13 0819  . gemfibrozil (LOPID) tablet 600 mg  600 mg Oral BID AC Shuvon Rankin, NP   600 mg at 03/18/13 1723  . guaiFENesin (MUCINEX) 12 hr tablet 600 mg  600 mg Oral BID Sanjuana Kava, NP   600 mg at 03/18/13 1723  . loratadine (CLARITIN) tablet 10 mg  10 mg Oral Daily Nanine Means, NP   10 mg at 03/18/13 0820  . magnesium hydroxide (MILK OF MAGNESIA) suspension 30 mL  30 mL Oral Daily PRN Shuvon Rankin, NP      . menthol-cetylpyridinium (CEPACOL) lozenge 3 mg  1 lozenge Oral PRN Sanjuana Kava, NP   3 mg at  03/18/13 0949  . methocarbamol (ROBAXIN) tablet 500 mg  500 mg Oral [id number] Rachael Fee, MD   500 mg at 03/18/13 1438  . multivitamin with minerals tablet 1 tablet  1 tablet Oral Daily Shuvon Rankin, NP   1 tablet at 03/18/13 7829  . nabumetone (RELAFEN) tablet 750 mg  750 mg Oral BID Nanine Means, NP   750 mg at 03/18/13 1724  . nicotine (NICODERM CQ - dosed in mg/24 hours) patch 21 mg  21 mg Transdermal Daily Nanine Means, NP   21 mg at 03/18/13 5621  . omega-3 acid ethyl esters (LOVAZA) capsule 1 g  1 g Oral Daily Shuvon Rankin, NP   1 g at 03/18/13 0821  . pantoprazole (PROTONIX) EC tablet 40 mg  40 mg Oral Daily Shuvon Rankin, NP   40 mg at 03/18/13 3086  . QUEtiapine (SEROQUEL) tablet 200 mg  200 mg Oral QHS Sanjuana Kava, NP   200 mg at 03/17/13 2217  . tamsulosin (FLOMAX) capsule 0.4 mg  0.4 mg Oral Daily Shuvon Rankin, NP   0.4 mg at 03/18/13 5784  . thiamine (B-1) injection 100 mg  100 mg Intramuscular Once Shuvon Rankin, NP      . thiamine (VITAMIN B-1) tablet 100 mg  100 mg Oral Daily Shuvon Rankin, NP   100 mg at 03/18/13 6962  . traZODone (DESYREL) tablet 100 mg  100 mg Oral QHS PRN Shuvon Rankin, NP   100 mg at 03/17/13 2218    Lab Results:  No results found for this or any previous visit (from the past 48 hour(s)).  Physical Findings: AIMS: Facial and Oral Movements Muscles of Facial Expression: None, normal Lips and Perioral Area: None, normal Jaw: None, normal Tongue: None, normal,Extremity Movements Upper (arms, wrists, hands, fingers): None, normal Lower (legs, knees, ankles, toes): None, normal, Trunk Movements Neck, shoulders, hips: None, normal, Overall Severity Severity of abnormal movements (highest score from questions above): None, normal Incapacitation due to abnormal movements: None, normal Patient's awareness of abnormal movements (rate only patient's report): No Awareness, Dental Status Current problems with teeth and/or dentures?: No Does patient  usually wear dentures?: No  CIWA:  CIWA-Ar Total: 1 COWS:  COWS Total Score: 1  Treatment Plan Summary: Daily contact with patient to assess and evaluate symptoms and progress in treatment Medication management  Plan:  Supportive approach/coping skills/relapse prevention. Encouraged out of room, participation in group sessions and application of coping skills when distressed. Will continue to monitor response to/adverse effects of medications in use to assure effectiveness. Continue to monitor mood, behavior and interaction with staff and other patients. Had an orthopedic appointment today.  Allow patient to wear his leg brace for leg support. Continue current plan of care. Patient is encouraged to make his sobriety a priority. Medical Decision Making Problem Points:  Established problem, stable/improving (1) and Review of psycho-social stressors (1) Data Points:  Review of medication regiment & side effects (2)  I certify that inpatient services furnished can reasonably be expected to improve the patient's condition.  Rona Ravens. Tranise Forrest RPAC 6:17 PM 03/18/2013

## 2013-03-18 NOTE — Progress Notes (Signed)
Patient ID: [name] [name], male   DOB: 1961-04-29, 52 y.o.   MRN: [id number] CSW received call from [facility] stating they have bed for patient to come on 3/28 between 10 AM and 12 PM.  [facility] paperwork signed by [facility] physician and faxed to magistrate; findings and custody received. Sgt Paschal was contacted and CSW is to call back in the morning to confirm pickup and transport to [facility].  Patient informed he may be served papers tonight thus he is expecting them.  Patient wishes to followup at [facility] after treatment program at [facility].  Carney Bern, LCSWA

## 2013-03-19 DIAGNOSIS — F1010 Alcohol abuse, uncomplicated: Secondary | ICD-10-CM

## 2013-03-19 MED ORDER — QUETIAPINE FUMARATE 200 MG PO TABS: 200.0000 mg | ORAL_TABLET | Freq: Every day | ORAL | Status: DC

## 2013-03-19 MED ORDER — OMEPRAZOLE 20 MG PO CPDR: 20.0000 mg | DELAYED_RELEASE_CAPSULE | Freq: Every day | ORAL | Status: DC

## 2013-03-19 MED ORDER — TRAZODONE HCL 100 MG PO TABS
100.0000 mg | ORAL_TABLET | Freq: Every evening | ORAL | Status: DC | PRN
Start: 2013-03-19 — End: 2013-04-04

## 2013-03-19 MED ORDER — TAMSULOSIN HCL 0.4 MG PO CAPS: 0.4000 mg | ORAL_CAPSULE | Freq: Every day | ORAL | Status: DC

## 2013-03-19 MED ORDER — CITALOPRAM HYDROBROMIDE 10 MG PO TABS: 10.0000 mg | ORAL_TABLET | Freq: Every day | ORAL | Status: DC

## 2013-03-19 MED ORDER — OMEGA-3 FATTY ACIDS 1000 MG PO CAPS: 1.0000 g | ORAL_CAPSULE | Freq: Every day | ORAL | Status: DC

## 2013-03-19 MED ORDER — ALBUTEROL SULFATE HFA 108 (90 BASE) MCG/ACT IN AERS: 2.0000 | INHALATION_SPRAY | RESPIRATORY_TRACT | Status: DC | PRN

## 2013-03-19 MED ORDER — AMLODIPINE BESYLATE 5 MG PO TABS: 5.0000 mg | ORAL_TABLET | Freq: Every day | ORAL | Status: DC

## 2013-03-19 MED ORDER — TRAZODONE HCL 100 MG PO TABS: 100.0000 mg | ORAL_TABLET | Freq: Every evening | ORAL | Status: DC | PRN

## 2013-03-19 MED ORDER — METHOCARBAMOL 500 MG PO TABS: 500.0000 mg | ORAL_TABLET | ORAL | Status: DC

## 2013-03-19 MED ORDER — GEMFIBROZIL 600 MG PO TABS: 600.0000 mg | ORAL_TABLET | Freq: Two times a day (BID) | ORAL | Status: DC

## 2013-03-19 NOTE — Progress Notes (Signed)
Adult Psychoeducational Group Note  Date:  03/19/2013 Time:  11:52 AM  Group Topic/Focus:  Relapse Prevention Planning:   The focus of this group is to define relapse and discuss the need for planning to combat relapse.  Participation Level:  Did Not Attend   Additional Comments:  Pt. Didn't attend group.   Bing Plume D 03/19/2013, 11:52 AM

## 2013-03-19 NOTE — [facility] Suicide Risk Assessment (Signed)
Suicide Risk Assessment  Discharge Assessment     Demographic Factors:  Male, Low socioeconomic status and Unemployed  Mental Status Per Nursing Assessment::   On Admission:  NA  Current Mental Status by Physician: patient denies suicide ideation, intent or plan  Loss Factors: Decrease in vocational status and Financial problems/change in socioeconomic status  Historical Factors: Impulsivity  Risk Reduction Factors:   Religious beliefs about death, Positive social support and Positive therapeutic relationship  Continued Clinical Symptoms:  Alcohol/Substance Abuse/Dependencies  Cognitive Features That Contribute To Risk:  Closed-mindedness    Suicide Risk:  Minimal: No identifiable suicidal ideation.  Patients presenting with no risk factors but with morbid ruminations; may be classified as minimal risk based on the severity of the depressive symptoms  Discharge Diagnoses:   AXIS I:  Alcohol dependence. Cocaine abuse  AXIS II:  Deferred AXIS III:   Past Medical History  Diagnosis Date  . Bronchitis   . Acid reflux   . Pancreatitis     March 2013  . Gout   . Bipolar disorder   . Hyperlipidemia   . Alcohol abuse     6-8 cans of beer daily  . Pseudocyst of pancreas 02/28/2012  . Arthritis   . Fracture of lower leg   . Hypertension   . Chronic pain   . Chronic neck pain   . Chronic back pain   . Left arm pain     chronic   AXIS IV:  economic problems, other psychosocial or environmental problems and problems related to social environment AXIS V:  61-70 mild symptoms  Plan Of Care/Follow-up recommendations:  Activity:  as tolerated Diet:  healthy Tests:  routine blood test Other:  patient to keep his after care appointment  Is patient on multiple antipsychotic therapies at discharge:  No   Has Patient had three or more failed trials of antipsychotic monotherapy by history:  No  Recommended Plan for Multiple Antipsychotic Therapies: N/A  Tanielle Emigh,  Urias Sheek,MD 03/19/2013, 10:34 AM

## 2013-03-19 NOTE — Tx Team (Signed)
Interdisciplinary Treatment Plan Update (Adult)  Date: 03/19/2013  Time Reviewed: 9:58 AM   Progress in Treatment: Attending groups: Yes Participating in groups: Yes Taking medication as prescribed:  Yes Tolerating medication:  Yes Family/Significant othe contact made: No Patient understands diagnosis: Yes Discussing patient identified problems/goals with staff: Yes Medical problems stabilized or resolved:  Yes Denies suicidal/homicidal ideation: Yes Patient has not harmed self or Others: Yes  New problem(s) identified: None Identified  Discharge Plan or Barriers:  Patient discharging to [facility]   Additional comments: N/A  Reason for Continuation of Hospitalization: NA   Estimated length of stay: Discharge today  For review of initial/current patient goals, please see plan of care.  Attendees: Patient:     Family:     NP: Serena Colonel, NP 03/19/2013 9:58 AM   Nursing:   Carney Living, RN 03/19/2013 9:58 AM   Clinical Social Worker Ronda Fairly 03/19/2013 9:58 AM   Other:  Liliane Bade, Community Care Coordinator 03/19/2013 9:58 AM   Other:   03/19/2013 9:58 AM   Other:   03/19/2013 9:58 AM   Other:   03/19/2013 9:58 AM    Scribe for Treatment Team:   Carney Bern, LCSWA  03/19/2013 9:58 AM

## 2013-03-19 NOTE — Progress Notes (Signed)
[facility] Adult Case Management Discharge Plan :  Will you be returning to the same living situation after discharge: No. Pt discharging to [facility] At discharge, do you have transportation home?:Yes,  Sheriff Do you have the ability to pay for your medications:Yes,  with Medicaid  Release of information consent forms completed and in the chart;  Patient's signature needed at discharge.  Patient to Follow up at: Follow-up Information   Follow up with [facility] On 03/19/2013. [facility] will transport you to [facility])    Contact information:   [address] [place], [place] [address]      Patient denies SI/HI:   Yes,  denies both    Safety Planning and Suicide Prevention discussed:  No.  Clide Dales 03/19/2013, 11:57 AM

## 2013-03-19 NOTE — Discharge Summary (Signed)
Physician Discharge Summary Note  Patient:  [name] [name] is an 52 y.o., male MRN:  [id number] DOB:  03-05-1961 Patient phone:  [phone] (home)  Patient address:   [address] [place] [address],   Date of Admission:  03/12/2013  Date of Discharge: 03/19/13  Reason for Admission:  Alcohol intoxication  Discharge Diagnoses: Principal Problem:   Alcohol abuse Active Problems:   Cocaine abuse   Alcohol intoxication   Polysubstance abuse  Review of Systems  Constitutional: Negative.   HENT: Negative.   Eyes: Negative.   Respiratory: Negative.   Cardiovascular: Negative.   Gastrointestinal: Negative.   Genitourinary: Negative.   Musculoskeletal: Positive for myalgias, back pain and joint pain.       Patient had orthopedic appointment with the [facility] on 03/17/13 for his leg pain. Wear lt leg brace.  Skin: Negative.   Neurological: Negative.   Endo/Heme/Allergies: Negative.   Psychiatric/Behavioral: Positive for depression (Stabilied with medication prior to discharge) and substance abuse (Hx alcoholism and cocaine, THC abuse). Negative for suicidal ideas, hallucinations and memory loss. The patient has insomnia (Stabilized with medication prior to discharge). The patient is not nervous/anxious.    Axis Diagnosis:   AXIS I:  Alcohol Abuse and Cocaine abuse AXIS II:  Deferred AXIS III:   Past Medical History  Diagnosis Date  . Bronchitis   . Acid reflux   . Pancreatitis     March 2013  . Gout   . Bipolar disorder   . Hyperlipidemia   . Alcohol abuse     6-8 cans of beer daily  . Pseudocyst of pancreas 02/28/2012  . Arthritis   . Fracture of lower leg   . Hypertension   . Chronic pain   . Chronic neck pain   . Chronic back pain   . Left arm pain     chronic   AXIS IV:  economic problems, occupational problems, other psychosocial or environmental problems and Substance abuse issues AXIS V:  60  Level of Care:  [facility]  Hospital Course:  This  is an admission assessment for this 52 year old African-American male. Admitted to [facility] as a walk-in, medically cleared at the [facility] ED. His complaints included increased alcohol/cocaine use/abuse. Patient reports, "I came in to this hospital yesterday because I need some help to stop alcohol and cocaine abuse. I drink beer, liquor and use cocaine on a daily basis. I started using cocaine at age of 52 and alcohol when I was 52 I drink 6 packs daily and use cocaine twice daily. I have been sober from alcohol and drug x 15 months. At the time, I was incarcerated for selling drugs. I recently was charged with DUI and I have a pending court date on 03/15/13. I also was charged with DUI in 2012 and 2013 respectively.   After admission assessment/evaluation, it was determined that Mr. Datta was intoxicated and will need detoxification treatment protocol to stabilize his systems of alcohol intoxications and to combat the withdrawal symptoms of alcohol. Then he was started on librium treatment protocol. He was also enrolled in group counseling sessions and activities to learn coping skills that should help him after discharge to cope better and manage his substance abuse issues for a much longer sobriety.  Besides detoxification treatment protocol, Mr. Hoagland also has some previous medical issues and or concerns that required medication management and or monitoring. He received medication management for all thoseissues as well. He was assisted by [facility] staff  to get to his orthopedic appointment yesterday for his leg pains. He tolerated his treatment protocol without any significant adverse effects and or reactions presented.  And because patient also presented symptoms of depression, mood instability and insomnia, he was prescribed and received Citalopram 10 mg daily, Seroquel 200 mg Q bedtime for mood control and Trazodone  100 mg for sleep. He attended treatment team meeting this and met with treatment team  members, his reason for admission, present symptoms, response to treatment and discharge plans discussed. Patient endorsed that he is doing well and ready for discharge to face the next phase of his substance substance abuse treatment and recovery. It was then decided that he will continue treatment at the [facility] residential treatment in [place].    Upon discharge, patient adamantly denies suicidal, homicidal ideations, auditory, visual hallucinations, delusional, paranoia and or withdrawal symptoms. He was provided with 4 days worth supply samples of his discharge medications. He left [facility] with all personal belongings in no apparent distress. Transportation per [employer].  Consults:  None  Significant Diagnostic Studies:  labs: CBC with diff, CMP, UDS, Toxioclogy tests  Discharge Vitals:   Blood pressure 141/92, pulse 93, temperature 97.1 F (36.2 C), temperature source Oral, resp. rate 20, height 5' 8.11" (1.73 m), weight 81.647 kg (180 lb), SpO2 98.00%. Body mass index is 27.28 kg/(m^2). Lab Results:   No results found for this or any previous visit (from the past 72 hour(s)).  Physical Findings: AIMS: Facial and Oral Movements Muscles of Facial Expression: None, normal Lips and Perioral Area: None, normal Jaw: None, normal Tongue: None, normal,Extremity Movements Upper (arms, wrists, hands, fingers): None, normal Lower (legs, knees, ankles, toes): None, normal, Trunk Movements Neck, shoulders, hips: None, normal, Overall Severity Severity of abnormal movements (highest score from questions above): None, normal Incapacitation due to abnormal movements: None, normal Patient's awareness of abnormal movements (rate only patient's report): No Awareness, Dental Status Current problems with teeth and/or dentures?: No Does patient usually wear dentures?: No  CIWA:  CIWA-Ar Total: 0 COWS:  COWS Total Score: 1  Psychiatric Specialty Exam: See Psychiatric Specialty Exam and Suicide Risk  Assessment completed by Attending Physician prior to discharge.  Discharge destination:  [facility]  Is patient on multiple antipsychotic therapies at discharge:  No   Has Patient had three or more failed trials of antipsychotic monotherapy by history:  No  Recommended Plan for Multiple Antipsychotic Therapies: NA       Future Appointments Provider Department Dept Phone   04/08/2013 8:00 AM Salley Scarlet, MD [facility] [phone]       Medication List    STOP taking these medications       guaiFENesin 600 MG 12 hr tablet  Commonly known as:  MUCINEX     HYDROcodone-acetaminophen 5-325 MG per tablet  Commonly known as:  NORCO/VICODIN     multivitamin with minerals Tabs     promethazine 12.5 MG tablet  Commonly known as:  PHENERGAN      TAKE these medications     Indication   albuterol 108 (90 BASE) MCG/ACT inhaler  Commonly known as:  PROVENTIL HFA;VENTOLIN HFA  Inhale 2 puffs into the lungs every 4 (four) hours as needed for wheezing. For asthma/wheezing   Indication:  Asthma     amLODipine 5 MG tablet  Commonly known as:  NORVASC  Take 1 tablet (5 mg total) by mouth daily. For high blood pressure control   Indication:  High Blood Pressure  citalopram 10 MG tablet  Commonly known as:  CELEXA  Take 1 tablet (10 mg total) by mouth daily. For depression   Indication:  Depression     fish oil-omega-3 fatty acids 1000 MG capsule  Take 1 capsule (1 g total) by mouth daily. For cholesterol control   Indication:  High Amount of Cholesterol in the Blood     gemfibrozil 600 MG tablet  Commonly known as:  LOPID  Take 1 tablet (600 mg total) by mouth 2 (two) times daily before a meal. Cholesterol control   Indication:  Type IV Hyperlipidemia, Excess of Lipids in the Blood     methocarbamol 500 MG tablet  Commonly known as:  ROBAXIN  Take 1 tablet (500 mg total) by mouth 3 (three) times daily at 8am, 2pm and bedtime. For muscle spasms   Indication:   Musculoskeletal Pain     omeprazole 20 MG capsule  Commonly known as:  PRILOSEC  Take 1 capsule (20 mg total) by mouth daily. For acid reflux   Indication:  Gastroesophageal Reflux Disease with Current Symptoms     QUEtiapine 200 MG tablet  Commonly known as:  SEROQUEL  Take 1 tablet (200 mg total) by mouth at bedtime. For mood control   Indication:  Depressive Phase of Manic-Depression, Trouble Sleeping, Manic Phase of Manic-Depression     tamsulosin 0.4 MG Caps  Commonly known as:  FLOMAX  Take 1 capsule (0.4 mg total) by mouth daily. For enlarged prostate   Indication:  Enlarged Prostate with Urination Problems     traZODone 100 MG tablet  Commonly known as:  DESYREL  Take 1 tablet (100 mg total) by mouth at bedtime as needed for sleep. For depression/sleep   Indication:  Trouble Sleeping, Major Depressive Disorder       Follow-up Information   Follow up with [facility] On 03/19/2013. [facility] will transport you to [facility])    Contact information:   [address] [place], [place] [address]      Follow-up recommendations:  Activity:  As tolerated Diet: As recommended by your primary care doctor. Keep all scheduled follow-up appointments as recommended.   Comments: Take all your medications as prescribed by your mental healthcare provider. Report any adverse effects and or reactions from your medicines to your outpatient provider promptly. Patient is instructed and cautioned to not engage in alcohol and or illegal drug use while on prescription medicines. In the event of worsening symptoms, patient is instructed to call the crisis hotline, 911 and or go to the nearest ED for appropriate evaluation and treatment of symptoms. Follow-up with your primary care provider for your other medical issues, concerns and or health care needs.    Total Discharge Time:  Greater than 30 minutes.  Signed: Armandina Stammer I 03/19/2013, 1:25 PM

## 2013-03-19 NOTE — Progress Notes (Signed)
Patient did attend the evening karaoke group. Pt was attentive and supportive.   

## 2013-03-19 NOTE — Progress Notes (Signed)
[facility] INPATIENT:  Family/Significant Other Suicide Prevention Education  Suicide Prevention Education:  Patient Discharged to Other Healthcare Facility:  Suicide Prevention Education Not Provided: {PT. DISCHARGED TO OTHER HEALTHCARE FACILITY:SUICIDE PREVENTION EDUCATION NOT PROVIDED (CHL):  The patient is discharging to another healthcare facility for continuation of treatment.  The patient's medical information, including suicide ideations and risk factors, are a part of the medical information shared with the receiving healthcare facility.  [name] [name] 03/19/2013, 11:59 AM

## 2013-03-19 NOTE — Progress Notes (Signed)
[facility] LCSW Aftercare Discharge Planning Group Note  03/19/2013 8:45  Participation Quality:  Appropriate, Attentive and Sharing  Affect:  Appropriate  Cognitive:  Alert, Appropriate and Oriented  Insight:  Engaged and Improving  Engagement in Group:  Engaged  Modes of Intervention:  Clarification, Discussion, Exploration, Problem-solving, Socialization and Support  Summary of Progress/Problems:  Pt denies both suicidal and homicidal ideation.  On a scale of 1 to 10 with ten being the most ever experienced, the patient rates depression at a 4 and anxiety at a 4. Patient reports he feels good about discharge to [facility] yet was awake for several hours contemplating anxiety should he have to go to [facility] in handcuffs or shackles due to Involuntary status. Patient shared that his sister and fiancee had found multiple 1/2 pints of alcohol and beers hidden around house.  Patient appreciated the fact they were cleaning things out.  He continues to report interest in [employer] following [facility] discharge.    [name] [name]

## 2013-03-19 NOTE — Progress Notes (Signed)
Patient ID: [name] [name], male   DOB: 1961/07/31, 52 y.o.   MRN: [id number] Patient discharged per physician order; patient denies SI/HI and A/V hallucinations; patient received envelope, copy of AVS, samples, and prescriptions after it was reviewed with him; patient had no other concerns at this time; patient left ambulatory and verbalized and signed that he received all his belongings; patient left with the sheriff

## 2013-03-22 NOTE — Discharge Summary (Signed)
Seen and agreed. Mionna Advincula, MD 

## 2013-03-24 NOTE — Progress Notes (Signed)
Patient Discharge Instructions:  After Visit Summary (AVS):   Faxed to:  03/24/13 Discharge Summary Note:   Faxed to:  03/24/13 Psychiatric Admission Assessment Note:   Faxed to:  03/24/13 Suicide Risk Assessment - Discharge Assessment:   Faxed to:  03/24/13 Faxed/Sent to the Next Level Care provider:  03/24/13 Faxed to [facility] @ [phone]  Jerelene Redden, 03/24/2013, 3:51 PM

## 2013-04-01 ENCOUNTER — Encounter (HOSPITAL_COMMUNITY): Payer: Self-pay

## 2013-04-01 ENCOUNTER — Emergency Department (HOSPITAL_COMMUNITY)
Admission: EM | Admit: 2013-04-01 | Discharge: 2013-04-01 | Disposition: A | Discharge status: Home / Self Care | Payer: Medicare Other | Attending: Emergency Medicine | Admitting: Emergency Medicine

## 2013-04-01 DIAGNOSIS — H53149 Visual discomfort, unspecified: Secondary | ICD-10-CM | POA: Insufficient documentation

## 2013-04-01 DIAGNOSIS — R110 Nausea: Secondary | ICD-10-CM | POA: Insufficient documentation

## 2013-04-01 DIAGNOSIS — F1010 Alcohol abuse, uncomplicated: Secondary | ICD-10-CM | POA: Insufficient documentation

## 2013-04-01 DIAGNOSIS — R51 Headache: Secondary | ICD-10-CM | POA: Insufficient documentation

## 2013-04-01 DIAGNOSIS — M549 Dorsalgia, unspecified: Secondary | ICD-10-CM | POA: Insufficient documentation

## 2013-04-01 DIAGNOSIS — Z862 Personal history of diseases of the blood and blood-forming organs and certain disorders involving the immune mechanism: Secondary | ICD-10-CM | POA: Insufficient documentation

## 2013-04-01 DIAGNOSIS — M79609 Pain in unspecified limb: Secondary | ICD-10-CM | POA: Insufficient documentation

## 2013-04-01 DIAGNOSIS — F17200 Nicotine dependence, unspecified, uncomplicated: Secondary | ICD-10-CM | POA: Insufficient documentation

## 2013-04-01 DIAGNOSIS — M542 Cervicalgia: Secondary | ICD-10-CM | POA: Insufficient documentation

## 2013-04-01 DIAGNOSIS — K219 Gastro-esophageal reflux disease without esophagitis: Secondary | ICD-10-CM | POA: Insufficient documentation

## 2013-04-01 DIAGNOSIS — Z79899 Other long term (current) drug therapy: Secondary | ICD-10-CM | POA: Insufficient documentation

## 2013-04-01 DIAGNOSIS — I10 Essential (primary) hypertension: Secondary | ICD-10-CM | POA: Insufficient documentation

## 2013-04-01 DIAGNOSIS — Z8639 Personal history of other endocrine, nutritional and metabolic disease: Secondary | ICD-10-CM | POA: Insufficient documentation

## 2013-04-01 DIAGNOSIS — F319 Bipolar disorder, unspecified: Secondary | ICD-10-CM | POA: Insufficient documentation

## 2013-04-01 DIAGNOSIS — Z8719 Personal history of other diseases of the digestive system: Secondary | ICD-10-CM | POA: Insufficient documentation

## 2013-04-01 DIAGNOSIS — G8929 Other chronic pain: Secondary | ICD-10-CM | POA: Insufficient documentation

## 2013-04-01 DIAGNOSIS — Z8781 Personal history of (healed) traumatic fracture: Secondary | ICD-10-CM | POA: Insufficient documentation

## 2013-04-01 MED ORDER — METOCLOPRAMIDE HCL 5 MG/ML IJ SOLN
10.0000 mg | Freq: Once | INTRAMUSCULAR | Status: AC
  Administered 2013-04-01: 10 mg via INTRAVENOUS
  Filled 2013-04-01: qty 2

## 2013-04-01 MED ORDER — PROCHLORPERAZINE MALEATE 10 MG PO TABS: 10.0000 mg | ORAL_TABLET | Freq: Four times a day (QID) | ORAL | Status: DC | PRN

## 2013-04-01 MED ORDER — SODIUM CHLORIDE 0.9 % IV SOLN
1000.0000 mL | Freq: Once | INTRAVENOUS | Status: AC
  Administered 2013-04-01: 1000 mL via INTRAVENOUS

## 2013-04-01 MED ORDER — SODIUM CHLORIDE 0.9 % IV SOLN
1000.0000 mL | INTRAVENOUS | Status: DC
  Administered 2013-04-01: 1000 mL via INTRAVENOUS

## 2013-04-01 MED ORDER — DIPHENHYDRAMINE HCL 50 MG/ML IJ SOLN
25.0000 mg | Freq: Once | INTRAMUSCULAR | Status: AC
  Administered 2013-04-01: 25 mg via INTRAVENOUS
  Filled 2013-04-01: qty 1

## 2013-04-01 NOTE — ED Provider Notes (Signed)
History    This chart was scribed for Dione Booze, MD by Sofie Rower, ED Scribe. The patient was seen in room APA11/APA11 and the patient's care was started at 1:50PM    CSN: [id number]  Arrival date & time 04/01/13  1233   First MD Initiated Contact with Patient 04/01/13 1350      Chief Complaint  Patient presents with  . Headache    (Consider location/radiation/quality/duration/timing/severity/associated sxs/prior treatment) The history is provided by the patient. No language interpreter was used.    [name] [name] is a 52 y.o. male , with a hx of hypertension, bipolar disorder, arthritis, and chronic pain, who presents to the Emergency Department complaining of sudden, progressively worsening, non radiating, headache located at the right frontal region, onset today (04/01/13).  Associated symptoms include nausea and photophobia. The pt reports he is experiencing a right frontal headache, characterized as an aching sensation, intensified by bright light. Furthermore, the pt rates his headache at an 8/10 at present time. The pt has taken [facility] powder which he informs, does not provide relief of the headache.  The pt denies experiencing any blurred vision.    The pt is a current everyday smoker (0.5 packs/day), in addition to drinking alcohol.   PCP is Dr. Jeanice Lim.    Past Medical History  Diagnosis Date  . Bronchitis   . Acid reflux   . Pancreatitis     March 2013  . Gout   . Bipolar disorder   . Hyperlipidemia   . Alcohol abuse     6-8 cans of beer daily  . Pseudocyst of pancreas 02/28/2012  . Arthritis   . Fracture of lower leg   . Hypertension   . Chronic pain   . Chronic neck pain   . Chronic back pain   . Left arm pain     chronic    Past Surgical History  Procedure Laterality Date  . Nose surgery      broken nose  . Circumcision  03/17/2012    Procedure: CIRCUMCISION ADULT;  Surgeon: Ky Barban, MD;  Location: [facility];  Service: Urology;  Laterality: N/A;  .  Colonoscopy with propofol  12/24/2012    Procedure: COLONOSCOPY WITH PROPOFOL;  Surgeon: Corbin Ade, MD;  Location: [facility];  Service: Endoscopy;  Laterality: N/A;  started at 0803, in cecum at 0812, withdrawel time Biopsy of anal lesion  . Esophagogastroduodenoscopy (egd) with propofol  12/24/2012    Procedure: ESOPHAGOGASTRODUODENOSCOPY (EGD) WITH PROPOFOL;  Surgeon: Corbin Ade, MD;  Location: [facility];  Service: Endoscopy;  Laterality: N/A;  done at 0800    Family History  Problem Relation Age of Onset  . Diabetes Father   . Anesthesia problems Neg Hx   . Hypotension Neg Hx   . Malignant hyperthermia Neg Hx   . Pseudochol deficiency Neg Hx   . Colon cancer Neg Hx     History  Substance Use Topics  . Smoking status: Current Every Day Smoker -- 0.50 packs/day for 25 years    Types: Cigarettes  . Smokeless tobacco: Not on file  . Alcohol Use: 0.0 oz/week     Comment: 6-8 beers a day      Review of Systems  Eyes: Positive for photophobia. Negative for visual disturbance.  Gastrointestinal: Positive for nausea.  Neurological: Positive for headaches.  All other systems reviewed and are negative.    Allergies  Penicillins  Home Medications   Current Outpatient Rx  Name  Route  Sig  Dispense  Refill  . albuterol (PROVENTIL HFA;VENTOLIN HFA) 108 (90 BASE) MCG/ACT inhaler   Inhalation   Inhale 2 puffs into the lungs every 4 (four) hours as needed for wheezing. For asthma/wheezing   1 Inhaler   2   . amLODipine (NORVASC) 5 MG tablet   Oral   Take 1 tablet (5 mg total) by mouth daily. For high blood pressure control   30 tablet   0   . citalopram (CELEXA) 10 MG tablet   Oral   Take 1 tablet (10 mg total) by mouth daily. For depression   30 tablet   0   . fish oil-omega-3 fatty acids 1000 MG capsule   Oral   Take 1 capsule (1 g total) by mouth daily. For cholesterol control         . gemfibrozil (LOPID) 600 MG tablet   Oral   Take 1 tablet (600 mg  total) by mouth 2 (two) times daily before a meal. Cholesterol control   60 tablet   3   . guaiFENesin (MUCINEX) 600 MG 12 hr tablet   Oral   Take 1,200 mg by mouth 2 (two) times daily.         Marland Kitchen ibuprofen (ADVIL,MOTRIN) 200 MG tablet   Oral   Take 600 mg by mouth every 6 (six) hours as needed for pain.         . methocarbamol (ROBAXIN) 500 MG tablet   Oral   Take 1 tablet (500 mg total) by mouth 3 (three) times daily at 8am, 2pm and bedtime. For muscle spasms   90 tablet   0   . omeprazole (PRILOSEC) 20 MG capsule   Oral   Take 1 capsule (20 mg total) by mouth daily. For acid reflux   30 capsule   3   . QUEtiapine (SEROQUEL) 200 MG tablet   Oral   Take 1 tablet (200 mg total) by mouth at bedtime. For mood control   30 tablet   0   . tamsulosin (FLOMAX) 0.4 MG CAPS   Oral   Take 1 capsule (0.4 mg total) by mouth daily. For enlarged prostate   30 capsule   6   . traZODone (DESYREL) 100 MG tablet   Oral   Take 1 tablet (100 mg total) by mouth at bedtime as needed for sleep. For depression/sleep   30 tablet   0     BP 147/104  Pulse 71  Temp(Src) 98 F (36.7 C) (Oral)  Resp 17  Ht 5' 9.5" (1.765 m)  Wt 180 lb (81.647 kg)  BMI 26.21 kg/m2  SpO2 100%  Physical Exam  Nursing note and vitals reviewed. Constitutional: He is oriented to person, place, and time. He appears well-developed and well-nourished. No distress.  HENT:  Head: Normocephalic and atraumatic.  Eyes: Conjunctivae and EOM are normal. Pupils are equal, round, and reactive to light.  Fundi normal.   Neck: Neck supple. No tracheal deviation present.  Cardiovascular: Normal rate, regular rhythm and normal heart sounds.   No murmur heard. Pulmonary/Chest: Effort normal. No respiratory distress.  Abdominal: Soft. He exhibits no distension.  Musculoskeletal: Normal range of motion. He exhibits no edema.  Neurological: He is alert and oriented to person, place, and time. No sensory deficit.   Skin: Skin is warm and dry.  Psychiatric: He has a normal mood and affect. His behavior is normal.    ED Course  Procedures (including critical care time)  DIAGNOSTIC STUDIES: Oxygen Saturation is 100% on room air, normal by my interpretation.    COORDINATION OF CARE:  1:56 PM- Treatment plan concerning nausea and pain management discussed with patient. Pt agrees with treatment.  3:23 PM- Recheck. Pt feeling better at this time. Treatment plan discussed with patient. Pt agrees with treatment.         1. Headache   2. Hypertension       MDM  Headache which seems to be a migraine variant. He'll be given a migraine cocktail and reassessed.  He feels much better after above noted treatment. He is sent home with prescription for prochlorperazine to use as needed should headache recur.  I personally performed the services described in this documentation, which was scribed in my presence. The recorded information has been reviewed and is accurate.      Dione Booze, MD 04/01/13 1538

## 2013-04-01 NOTE — ED Notes (Signed)
1.Pt reports headache that started last night, +nausea, has taken sinus meds with no relief 2. Also having low back pain and left hip pain which is chronic, has taken no meds for his pain. 3. Ab pain since yesterday, pt reports "loss of appetite", no diarrhea, reports normal bm, no fever. Has not taken any meds for this pain.

## 2013-04-04 ENCOUNTER — Encounter (HOSPITAL_COMMUNITY): Payer: Self-pay

## 2013-04-04 ENCOUNTER — Emergency Department (HOSPITAL_COMMUNITY)
Admission: EM | Admit: 2013-04-04 | Discharge: 2013-04-04 | Disposition: A | Discharge status: Home / Self Care | Payer: Medicare Other | Attending: Emergency Medicine | Admitting: Emergency Medicine

## 2013-04-04 ENCOUNTER — Emergency Department (HOSPITAL_COMMUNITY): Payer: Medicare Other

## 2013-04-04 DIAGNOSIS — I10 Essential (primary) hypertension: Secondary | ICD-10-CM | POA: Insufficient documentation

## 2013-04-04 DIAGNOSIS — Z8781 Personal history of (healed) traumatic fracture: Secondary | ICD-10-CM | POA: Insufficient documentation

## 2013-04-04 DIAGNOSIS — R51 Headache: Secondary | ICD-10-CM | POA: Insufficient documentation

## 2013-04-04 DIAGNOSIS — Z8739 Personal history of other diseases of the musculoskeletal system and connective tissue: Secondary | ICD-10-CM | POA: Insufficient documentation

## 2013-04-04 DIAGNOSIS — D72819 Decreased white blood cell count, unspecified: Secondary | ICD-10-CM | POA: Insufficient documentation

## 2013-04-04 DIAGNOSIS — F1410 Cocaine abuse, uncomplicated: Secondary | ICD-10-CM | POA: Insufficient documentation

## 2013-04-04 DIAGNOSIS — F319 Bipolar disorder, unspecified: Secondary | ICD-10-CM | POA: Insufficient documentation

## 2013-04-04 DIAGNOSIS — F1010 Alcohol abuse, uncomplicated: Secondary | ICD-10-CM | POA: Insufficient documentation

## 2013-04-04 DIAGNOSIS — R55 Syncope and collapse: Secondary | ICD-10-CM

## 2013-04-04 DIAGNOSIS — K219 Gastro-esophageal reflux disease without esophagitis: Secondary | ICD-10-CM | POA: Insufficient documentation

## 2013-04-04 DIAGNOSIS — G8929 Other chronic pain: Secondary | ICD-10-CM | POA: Insufficient documentation

## 2013-04-04 DIAGNOSIS — Z79899 Other long term (current) drug therapy: Secondary | ICD-10-CM | POA: Insufficient documentation

## 2013-04-04 DIAGNOSIS — M109 Gout, unspecified: Secondary | ICD-10-CM | POA: Insufficient documentation

## 2013-04-04 DIAGNOSIS — E785 Hyperlipidemia, unspecified: Secondary | ICD-10-CM | POA: Insufficient documentation

## 2013-04-04 DIAGNOSIS — Z8709 Personal history of other diseases of the respiratory system: Secondary | ICD-10-CM | POA: Insufficient documentation

## 2013-04-04 DIAGNOSIS — Z8719 Personal history of other diseases of the digestive system: Secondary | ICD-10-CM | POA: Insufficient documentation

## 2013-04-04 LAB — BASIC METABOLIC PANEL
BUN: 23 mg/dL (ref 6–23)
CO2: 21 mEq/L (ref 19–32)
Calcium: 8.2 mg/dL — ABNORMAL LOW (ref 8.4–10.5)
Chloride: 100 mEq/L (ref 96–112)
Creatinine, Ser: 1.08 mg/dL (ref 0.50–1.35)
GFR calc Af Amer: >90 mL/min (ref >90)
GFR calc non Af Amer: 79 mL/min — ABNORMAL LOW (ref >90)
Glucose, Bld: 95 mg/dL (ref 70–99)
Potassium: 3.3 mEq/L — ABNORMAL LOW (ref 3.5–5.1)
Sodium: 136 mEq/L (ref 135–145)

## 2013-04-04 LAB — CBC WITH DIFFERENTIAL/PLATELET
Basophils Absolute: 0.1 10*3/uL (ref 0.0–0.1)
Basophils Relative: 4 % — ABNORMAL HIGH (ref 0–1)
Eosinophils Absolute: 0.1 10*3/uL (ref 0.0–0.7)
Eosinophils Relative: 3 % (ref 0–5)
HCT: 38.9 % — ABNORMAL LOW (ref 39.0–52.0)
Hemoglobin: 13.5 g/dL (ref 13.0–17.0)
Lymphocytes Relative: 54 % — ABNORMAL HIGH (ref 12–46)
Lymphs Abs: 1 10*3/uL (ref 0.7–4.0)
MCH: 32.7 pg (ref 26.0–34.0)
MCHC: 34.7 g/dL (ref 30.0–36.0)
MCV: 94.2 fL (ref 78.0–100.0)
Monocytes Absolute: 0.2 10*3/uL (ref 0.1–1.0)
Monocytes Relative: 8 % (ref 3–12)
Neutro Abs: 0.6 10*3/uL — ABNORMAL LOW (ref 1.7–7.7)
Neutrophils Relative %: 31 % — ABNORMAL LOW (ref 43–77)
Platelets: 315 10*3/uL (ref 150–400)
RBC: 4.13 MIL/uL — ABNORMAL LOW (ref 4.22–5.81)
RDW: 14.3 % (ref 11.5–15.5)
WBC: 2 10*3/uL — ABNORMAL LOW (ref 4.0–10.5)

## 2013-04-04 LAB — GLUCOSE, CAPILLARY: Glucose-Capillary: 103 mg/dL — ABNORMAL HIGH (ref 70–99)

## 2013-04-04 LAB — RAPID URINE DRUG SCREEN, HOSP PERFORMED
Amphetamines: NOT DETECTED
Barbiturates: NOT DETECTED
Benzodiazepines: NOT DETECTED
Cocaine: POSITIVE — AB
Opiates: NOT DETECTED
Tetrahydrocannabinol: NOT DETECTED

## 2013-04-04 LAB — TROPONIN I
Troponin I: <0.3 ng/mL (ref <0.30)
Troponin I: <0.3 ng/mL (ref <0.30)

## 2013-04-04 LAB — ETHANOL: Alcohol, Ethyl (B): 71 mg/dL — ABNORMAL HIGH (ref 0–11)

## 2013-04-04 MED ORDER — IBUPROFEN 800 MG PO TABS
800.0000 mg | ORAL_TABLET | Freq: Once | ORAL | Status: AC
  Administered 2013-04-04: 800 mg via ORAL
  Filled 2013-04-04: qty 1

## 2013-04-04 MED ORDER — SODIUM CHLORIDE 0.9 % IV BOLUS (SEPSIS)
1000.0000 mL | Freq: Once | INTRAVENOUS | Status: AC
  Administered 2013-04-04: 1000 mL via INTRAVENOUS

## 2013-04-04 MED ORDER — SODIUM CHLORIDE 0.9 % IV BOLUS (SEPSIS): 1000.0000 mL | Freq: Once | INTRAVENOUS | Status: DC

## 2013-04-04 MED ORDER — SODIUM CHLORIDE 0.9 % IV SOLN
Freq: Once | INTRAVENOUS | Status: AC
  Administered 2013-04-04: 13:00:00 via INTRAVENOUS

## 2013-04-04 NOTE — ED Notes (Signed)
Pt attempted to void x2 with urinal. Pt too lethargic to obtain sample.EDP notified and given order to obtain urine via in and out cath. Pt tolerated well and woke up after urine sample obtained but is still lethargic. EDP aware no new orders given.

## 2013-04-04 NOTE — ED Notes (Signed)
Per EMS, pt hypotension. Pt denies any other symptoms

## 2013-04-04 NOTE — ED Notes (Signed)
Pt ambulated in room, pt states he still feels dizzy, pt states he has a headache. RN is aware

## 2013-04-04 NOTE — ED Provider Notes (Signed)
History  This chart was scribed for Joya Gaskins, MD, by Candelaria Stagers, ED Scribe. This patient was seen in room [facility] and the patient's care was started at 12:09 PM   CSN: [id number]  Arrival date & time 04/04/13  1206   First MD Initiated Contact with Patient 04/04/13 1207      Chief Complaint  Patient presents with  . Hypotension     Patient is a 52 y.o. male presenting with syncope. The history is provided by the EMS personnel. The history is limited by the condition of the patient. No language interpreter was used.  Loss of Consciousness  This is a new problem. The current episode started less than 1 hour ago. The problem has been gradually improving. He lost consciousness for a period of less than one minute. The problem is associated with standing up. Associated symptoms include dizziness. Treatments tried: rest. The treatment provided mild relief.   [name] [name] is a 52 y.o. male who presents to the Emergency Department BIB EMS from church after becoming dizzy and experiencing syncope while at church.  EMS reports BP of 72/40 and 90/68 after giving fluids en route.  Denies head trauma.  Pt's fiance reports he was in the ED for hypertension and denies any changes to medications.  She reports he took his medication this morning, that he has not been sick, and that he has been drinking normally.  CBG per EMS was 100.  Pt is responsive, but keeps eyes closed.     Past Medical History  Diagnosis Date  . Bronchitis   . Acid reflux   . Pancreatitis     March 2013  . Gout   . Bipolar disorder   . Hyperlipidemia   . Alcohol abuse     6-8 cans of beer daily  . Pseudocyst of pancreas 02/28/2012  . Arthritis   . Fracture of lower leg   . Hypertension   . Chronic pain   . Chronic neck pain   . Chronic back pain   . Left arm pain     chronic    Past Surgical History  Procedure Laterality Date  . Nose surgery      broken nose  . Circumcision  03/17/2012     Procedure: CIRCUMCISION ADULT;  Surgeon: Ky Barban, MD;  Location: [facility];  Service: Urology;  Laterality: N/A;  . Colonoscopy with propofol  12/24/2012    Procedure: COLONOSCOPY WITH PROPOFOL;  Surgeon: Corbin Ade, MD;  Location: [facility];  Service: Endoscopy;  Laterality: N/A;  started at 0803, in cecum at 0812, withdrawel time Biopsy of anal lesion  . Esophagogastroduodenoscopy (egd) with propofol  12/24/2012    Procedure: ESOPHAGOGASTRODUODENOSCOPY (EGD) WITH PROPOFOL;  Surgeon: Corbin Ade, MD;  Location: [facility];  Service: Endoscopy;  Laterality: N/A;  done at 0800    Family History  Problem Relation Age of Onset  . Diabetes Father   . Anesthesia problems Neg Hx   . Hypotension Neg Hx   . Malignant hyperthermia Neg Hx   . Pseudochol deficiency Neg Hx   . Colon cancer Neg Hx     History  Substance Use Topics  . Smoking status: Current Every Day Smoker -- 0.50 packs/day for 25 years    Types: Cigarettes  . Smokeless tobacco: Not on file  . Alcohol Use: 0.0 oz/week     Comment: 6-8 beers a day      Review of Systems  Unable to perform  ROS: Mental status change  Cardiovascular: Positive for syncope.       Hypotensive  Neurological: Positive for dizziness and syncope.    Allergies  Penicillins  Home Medications   Current Outpatient Rx  Name  Route  Sig  Dispense  Refill  . albuterol (PROVENTIL HFA;VENTOLIN HFA) 108 (90 BASE) MCG/ACT inhaler   Inhalation   Inhale 2 puffs into the lungs every 4 (four) hours as needed for wheezing. For asthma/wheezing   1 Inhaler   2   . amLODipine (NORVASC) 5 MG tablet   Oral   Take 1 tablet (5 mg total) by mouth daily. For high blood pressure control   30 tablet   0   . citalopram (CELEXA) 10 MG tablet   Oral   Take 1 tablet (10 mg total) by mouth daily. For depression   30 tablet   0   . fish oil-omega-3 fatty acids 1000 MG capsule   Oral   Take 1 capsule (1 g total) by mouth daily. For cholesterol  control         . gemfibrozil (LOPID) 600 MG tablet   Oral   Take 1 tablet (600 mg total) by mouth 2 (two) times daily before a meal. Cholesterol control   60 tablet   3   . guaiFENesin (MUCINEX) 600 MG 12 hr tablet   Oral   Take 1,200 mg by mouth 2 (two) times daily.         Marland Kitchen ibuprofen (ADVIL,MOTRIN) 200 MG tablet   Oral   Take 600 mg by mouth every 6 (six) hours as needed for pain.         . methocarbamol (ROBAXIN) 500 MG tablet   Oral   Take 1 tablet (500 mg total) by mouth 3 (three) times daily at 8am, 2pm and bedtime. For muscle spasms   90 tablet   0   . omeprazole (PRILOSEC) 20 MG capsule   Oral   Take 1 capsule (20 mg total) by mouth daily. For acid reflux   30 capsule   3   . prochlorperazine (COMPAZINE) 10 MG tablet   Oral   Take 1 tablet (10 mg total) by mouth every 6 (six) hours as needed (nausea or headache).   20 tablet   0   . QUEtiapine (SEROQUEL) 200 MG tablet   Oral   Take 1 tablet (200 mg total) by mouth at bedtime. For mood control   30 tablet   0   . tamsulosin (FLOMAX) 0.4 MG CAPS   Oral   Take 1 capsule (0.4 mg total) by mouth daily. For enlarged prostate   30 capsule   6   . traZODone (DESYREL) 100 MG tablet   Oral   Take 1 tablet (100 mg total) by mouth at bedtime as needed for sleep. For depression/sleep   30 tablet   0     BP 100/57  Temp(Src) 97.8 F (36.6 C)  Resp 20  Ht 5\' 9"  (1.753 m)  Wt 185 lb (83.915 kg)  BMI 27.31 kg/m2  SpO2 94%  Physical Exam CONSTITUTIONAL: Well developed/well nourished, somnolent HEAD: Normocephalic/atraumatic, no signs of trauma EYES: PERRL ENMT: Mucous membranes moist, poor dentition, no signs of trauma NECK: supple no meningeal signs SPINE:entire spine nontender CV: S1/S2 noted, no murmurs/rubs/gallops noted LUNGS: Lungs are clear to auscultation bilaterally, no apparent distress ABDOMEN: soft, nontender, no rebound or guarding GU:no cva tenderness NEURO: Pt is somnolent but  arousable, moves all extremitiesx4 EXTREMITIES: pulses  normal, full ROM, no signs of trauma SKIN: warm, color normal PSYCH: no abnormalities of mood noted  ED Course  Procedures   DIAGNOSTIC STUDIES: Oxygen Saturation is 94% on room air, adequate by my interpretation.    COORDINATION OF CARE:  12:12 PM  Blood work order, will give fluids.   Pt was at church and supposedly had syncopal episode but no head injury He is somnolent but easily arousable Initial EKG reveals prolonged qt compared to prior (but less than 500) Will advise to d/c seroquel/celexa/trazodone and needs outpatient recheck and potential change in these meds 1:52 PM Family at bedside They report that pt was at church standing up while singing and he became diaphoretic and he sat down with family.  No seizure reported.  No head injury.   He still is somnolent He does report a HA but this occurred after syncopal event He was seen recently for HA Will obtain CT imaging to ensure no signs of head injury Doubt SAH 3:32 PM Pt awake/alert He has no focal motor deficits He has no other pain complaints except for mild HA He is requesting something to eat Repeat troponin negative and CT head negative I doubt cardiac dysrhythmia as cause but he should hold his psychotropic meds due to mildly prolonged QT He also has some leukopenia by labs that have been seen previously.  Will refer to PCP this week for close followup   MDM  Nursing notes including past medical history and social history reviewed and considered in documentation Labs/vital reviewed and considered      Date: 04/04/2013  Rate: 65  Rhythm: normal sinus rhythm  QRS Axis: normal  Intervals: qtc mildly prolonged, 484 today, previously 435  ST/T Wave abnormalities: nonspecific ST changes  Conduction Disutrbances:none  Narrative Interpretation:   Old EKG Reviewed: changes noted    Date: 04/04/2013 1425  Rate: 71  Rhythm: normal sinus rhythm  QRS  Axis: normal  Intervals: qt at 489 on this EKG  ST/T Wave abnormalities: nonspecific ST changes  Conduction Disutrbances:none  Narrative Interpretation:   Old EKG Reviewed: unchanged from earlier in shift    I personally performed the services described in this documentation, which was scribed in my presence. The recorded information has been reviewed and is accurate.           Joya Gaskins, MD 04/04/13 1538

## 2013-04-08 ENCOUNTER — Ambulatory Visit (INDEPENDENT_AMBULATORY_CARE_PROVIDER_SITE_OTHER): Payer: Medicare Other | Admitting: Family Medicine

## 2013-04-08 ENCOUNTER — Encounter: Payer: Self-pay | Admitting: Family Medicine

## 2013-04-08 ENCOUNTER — Other Ambulatory Visit: Payer: Self-pay | Admitting: Family Medicine

## 2013-04-08 VITALS — BP 130/88 | HR 84 | Resp 16 | Wt 184.0 lb

## 2013-04-08 DIAGNOSIS — I10 Essential (primary) hypertension: Secondary | ICD-10-CM

## 2013-04-08 DIAGNOSIS — F319 Bipolar disorder, unspecified: Secondary | ICD-10-CM

## 2013-04-08 DIAGNOSIS — R55 Syncope and collapse: Secondary | ICD-10-CM

## 2013-04-08 DIAGNOSIS — D72819 Decreased white blood cell count, unspecified: Secondary | ICD-10-CM

## 2013-04-08 DIAGNOSIS — R51 Headache: Secondary | ICD-10-CM

## 2013-04-08 DIAGNOSIS — R9431 Abnormal electrocardiogram [ECG] [EKG]: Secondary | ICD-10-CM

## 2013-04-08 DIAGNOSIS — E876 Hypokalemia: Secondary | ICD-10-CM

## 2013-04-08 DIAGNOSIS — K861 Other chronic pancreatitis: Secondary | ICD-10-CM

## 2013-04-08 DIAGNOSIS — F1910 Other psychoactive substance abuse, uncomplicated: Secondary | ICD-10-CM

## 2013-04-08 DIAGNOSIS — F1010 Alcohol abuse, uncomplicated: Secondary | ICD-10-CM

## 2013-04-08 LAB — CBC WITH DIFFERENTIAL/PLATELET
Basophils Absolute: 0 10*3/uL (ref 0.0–0.1)
Basophils Relative: 0 % (ref 0–1)
Eosinophils Absolute: 0.1 10*3/uL (ref 0.0–0.7)
Eosinophils Relative: 4 % (ref 0–5)
HCT: 39.5 % (ref 39.0–52.0)
Hemoglobin: 13.6 g/dL (ref 13.0–17.0)
Lymphocytes Relative: 55 % — ABNORMAL HIGH (ref 12–46)
Lymphs Abs: 1.8 10*3/uL (ref 0.7–4.0)
MCH: 32.3 pg (ref 26.0–34.0)
MCHC: 34.4 g/dL (ref 30.0–36.0)
MCV: 93.8 fL (ref 78.0–100.0)
Monocytes Absolute: 0.2 10*3/uL (ref 0.1–1.0)
Monocytes Relative: 6 % (ref 3–12)
Neutro Abs: 1.1 10*3/uL — ABNORMAL LOW (ref 1.7–7.7)
Neutrophils Relative %: 32 % — ABNORMAL LOW (ref 43–77)
Platelets: 301 10*3/uL (ref 150–400)
RBC: 4.21 MIL/uL — ABNORMAL LOW (ref 4.22–5.81)
RDW: 14.9 % (ref 11.5–15.5)
WBC: 3.3 10*3/uL — ABNORMAL LOW (ref 4.0–10.5)

## 2013-04-08 LAB — BASIC METABOLIC PANEL
BUN: 16 mg/dL (ref 6–23)
CO2: 26 mEq/L (ref 19–32)
Calcium: 9.1 mg/dL (ref 8.4–10.5)
Chloride: 104 mEq/L (ref 96–112)
Creat: 0.88 mg/dL (ref 0.50–1.35)
Glucose, Bld: 88 mg/dL (ref 70–99)
Potassium: 3.9 mEq/L (ref 3.5–5.3)
Sodium: 142 mEq/L (ref 135–145)

## 2013-04-08 LAB — HEPATIC FUNCTION PANEL
ALT: 20 U/L (ref 0–53)
AST: 41 U/L — ABNORMAL HIGH (ref 0–37)
Albumin: 3.9 g/dL (ref 3.5–5.2)
Alkaline Phosphatase: 48 U/L (ref 39–117)
Bilirubin, Direct: 0.1 mg/dL (ref 0.0–0.3)
Indirect Bilirubin: 0.7 mg/dL (ref 0.0–0.9)
Total Bilirubin: 0.8 mg/dL (ref 0.3–1.2)
Total Protein: 7.4 g/dL (ref 6.0–8.3)

## 2013-04-08 LAB — MANUAL DIFFERENTIAL
Atypical Lymphocytes Manual: 14 % — ABNORMAL HIGH
Bands Manual: 0 % (ref 0–5)
Basophils Manual: 4 % — ABNORMAL HIGH (ref 0–1)
Blasts Manual: 0 %
Eosinophils Manual: 1 % (ref 0–5)
Lymphocytes Manual: 48 % — ABNORMAL HIGH (ref 12–46)
Metamyelocytes Manual: 0 %
Monocytes Manual: 7 % (ref 3–12)
Myelocytes Manual: 0 %
Neutrophils Manual: 26 % — ABNORMAL LOW (ref 43–77)

## 2013-04-08 LAB — LIPASE: Lipase: 28 U/L (ref 11–59)

## 2013-04-08 LAB — PATHOLOGIST SMEAR REVIEW

## 2013-04-08 LAB — HIV ANTIBODY (ROUTINE TESTING W REFLEX): HIV: NONREACTIVE

## 2013-04-08 LAB — TSH: TSH: 0.548 u[IU]/mL (ref 0.350–4.500)

## 2013-04-08 MED ORDER — KETOROLAC TROMETHAMINE 60 MG/2ML IM SOLN
60.0000 mg | Freq: Once | INTRAMUSCULAR | Status: AC
  Administered 2013-04-08: 60 mg via INTRAMUSCULAR

## 2013-04-08 NOTE — Progress Notes (Signed)
  Subjective:    Patient ID: [name] [name], male    DOB: December 09, 1961, 52 y.o.   MRN: [id number]  HPI Pt here to f/u emergency room visit. He was seen after a syncopal event at church. He was found to be hypotensive he was also found positive for cocaine and alcohol. His blood work showed an absolute neutropenia. His EKG showed is mildly prolonged QT so she was told to stop Celexa trazodone and Seroquel other he was not taking Seroquel before this. He stopped the Celexa and has not been taking trazodone on a regular basis anyway. However he restarted the Celexa 3 days ago. He also complains of continued headache and his chronic pains   Review of Systems  GEN- denies fatigue, fever, weight loss,weakness, recent illness HEENT- denies eye drainage, change in vision, nasal discharge, CVS- denies chest pain, palpitations RESP- denies SOB, cough, wheeze ABD- denies N/V, change in stools, abd pain GU- denies dysuria, hematuria, dribbling, incontinence MSK- denies joint pain, muscle aches, injury Neuro- denies headache, dizziness, syncope, seizure activity      Objective:   Physical Exam  GEN- NAD, alert and oriented x3, intoxicated HEENT- PERRL, EOMI, non injected sclera, pink conjunctiva, MMM, oropharynx clear Neck- Supple,  CVS- RRR, no murmur RESP-CTAB ABD-NABS,soft,Mild TTP epigastric region, NO rebound, no guarding EXT- No edema Pulses- Radial, DP- 2+ EKG- NSR, no ST changes, QTC 430 , compared to 4/13      Assessment & Plan:

## 2013-04-08 NOTE — Patient Instructions (Signed)
Continue the celexa Decrease robaxin to twice a day at most for back pain You need to stop using drugs and drinking alcohol Get the labs done today  F/U 1 month

## 2013-04-11 DIAGNOSIS — D72819 Decreased white blood cell count, unspecified: Secondary | ICD-10-CM | POA: Insufficient documentation

## 2013-04-11 DIAGNOSIS — R9431 Abnormal electrocardiogram [ECG] [EKG]: Secondary | ICD-10-CM | POA: Insufficient documentation

## 2013-04-11 DIAGNOSIS — R55 Syncope and collapse: Secondary | ICD-10-CM | POA: Insufficient documentation

## 2013-04-11 NOTE — Assessment & Plan Note (Signed)
I think this is multifactorial with ETOH and substance abuse, likley poor intake, ? related to the QT that was mildly prolonged on EKG, now resolved, He may need neuro work up Reviewed labs,

## 2013-04-11 NOTE — Assessment & Plan Note (Signed)
Unchanged, states he is going to rehab again, polysubtance abuse associated, he has been given resources multiple times

## 2013-04-11 NOTE — Assessment & Plan Note (Signed)
Being followed by [facility], he is not on a mood stablizer, he is not taking seroquel or trazodone, will continue celexa

## 2013-04-11 NOTE — Assessment & Plan Note (Addendum)
No recent infection, no suppressing medications, repeat shows absolute neutropenia , HIV negative Will send to hematology for review ? Related to ETOH use or substance abuse,

## 2013-04-11 NOTE — Assessment & Plan Note (Signed)
Unchanged, using cocaine on regular basis based on UDS

## 2013-04-11 NOTE — Assessment & Plan Note (Signed)
Repeat EKG normal QTC, he has been back on celexa will continue for now

## 2013-04-12 ENCOUNTER — Telehealth: Payer: Self-pay | Admitting: Family Medicine

## 2013-04-12 NOTE — Telephone Encounter (Signed)
Patient is aware 

## 2013-04-13 ENCOUNTER — Telehealth: Payer: Self-pay | Admitting: Family Medicine

## 2013-04-13 NOTE — Telephone Encounter (Signed)
Patient is aware 

## 2013-04-24 ENCOUNTER — Inpatient Hospital Stay (HOSPITAL_COMMUNITY)
Admission: EM | Admit: 2013-04-24 | Discharge: 2013-05-01 | DRG: 439 | Disposition: A | Discharge status: Home / Self Care | Payer: Medicare Other | Attending: Internal Medicine | Admitting: Internal Medicine

## 2013-04-24 ENCOUNTER — Encounter (HOSPITAL_COMMUNITY): Payer: Self-pay | Admitting: *Deleted

## 2013-04-24 ENCOUNTER — Emergency Department (HOSPITAL_COMMUNITY)
Admission: EM | Admit: 2013-04-24 | Discharge: 2013-04-24 | Discharge status: Against Medical Advice | Payer: Medicare Other | Attending: Emergency Medicine | Admitting: Emergency Medicine

## 2013-04-24 ENCOUNTER — Emergency Department (HOSPITAL_COMMUNITY): Payer: Medicare Other

## 2013-04-24 DIAGNOSIS — K560 Paralytic ileus: Secondary | ICD-10-CM | POA: Diagnosis not present

## 2013-04-24 DIAGNOSIS — I10 Essential (primary) hypertension: Secondary | ICD-10-CM

## 2013-04-24 DIAGNOSIS — F1410 Cocaine abuse, uncomplicated: Secondary | ICD-10-CM

## 2013-04-24 DIAGNOSIS — N529 Male erectile dysfunction, unspecified: Secondary | ICD-10-CM

## 2013-04-24 DIAGNOSIS — F1010 Alcohol abuse, uncomplicated: Secondary | ICD-10-CM

## 2013-04-24 DIAGNOSIS — R109 Unspecified abdominal pain: Secondary | ICD-10-CM

## 2013-04-24 DIAGNOSIS — G8929 Other chronic pain: Secondary | ICD-10-CM

## 2013-04-24 DIAGNOSIS — J309 Allergic rhinitis, unspecified: Secondary | ICD-10-CM

## 2013-04-24 DIAGNOSIS — E876 Hypokalemia: Secondary | ICD-10-CM

## 2013-04-24 DIAGNOSIS — K8020 Calculus of gallbladder without cholecystitis without obstruction: Secondary | ICD-10-CM | POA: Diagnosis present

## 2013-04-24 DIAGNOSIS — E785 Hyperlipidemia, unspecified: Secondary | ICD-10-CM | POA: Diagnosis present

## 2013-04-24 DIAGNOSIS — R0600 Dyspnea, unspecified: Secondary | ICD-10-CM

## 2013-04-24 DIAGNOSIS — E871 Hypo-osmolality and hyponatremia: Secondary | ICD-10-CM

## 2013-04-24 DIAGNOSIS — F17200 Nicotine dependence, unspecified, uncomplicated: Secondary | ICD-10-CM | POA: Insufficient documentation

## 2013-04-24 DIAGNOSIS — R55 Syncope and collapse: Secondary | ICD-10-CM

## 2013-04-24 DIAGNOSIS — M5430 Sciatica, unspecified side: Secondary | ICD-10-CM

## 2013-04-24 DIAGNOSIS — M129 Arthropathy, unspecified: Secondary | ICD-10-CM | POA: Diagnosis present

## 2013-04-24 DIAGNOSIS — Z9189 Other specified personal risk factors, not elsewhere classified: Secondary | ICD-10-CM | POA: Diagnosis present

## 2013-04-24 DIAGNOSIS — J069 Acute upper respiratory infection, unspecified: Secondary | ICD-10-CM

## 2013-04-24 DIAGNOSIS — K5900 Constipation, unspecified: Secondary | ICD-10-CM | POA: Diagnosis present

## 2013-04-24 DIAGNOSIS — J40 Bronchitis, not specified as acute or chronic: Secondary | ICD-10-CM

## 2013-04-24 DIAGNOSIS — K863 Pseudocyst of pancreas: Secondary | ICD-10-CM

## 2013-04-24 DIAGNOSIS — K852 Alcohol induced acute pancreatitis without necrosis or infection: Secondary | ICD-10-CM

## 2013-04-24 DIAGNOSIS — R0609 Other forms of dyspnea: Secondary | ICD-10-CM

## 2013-04-24 DIAGNOSIS — Z79899 Other long term (current) drug therapy: Secondary | ICD-10-CM

## 2013-04-24 DIAGNOSIS — IMO0002 Reserved for concepts with insufficient information to code with codable children: Secondary | ICD-10-CM

## 2013-04-24 DIAGNOSIS — M549 Dorsalgia, unspecified: Secondary | ICD-10-CM

## 2013-04-24 DIAGNOSIS — R110 Nausea: Secondary | ICD-10-CM | POA: Insufficient documentation

## 2013-04-24 DIAGNOSIS — N400 Enlarged prostate without lower urinary tract symptoms: Secondary | ICD-10-CM

## 2013-04-24 DIAGNOSIS — F1020 Alcohol dependence, uncomplicated: Secondary | ICD-10-CM | POA: Diagnosis present

## 2013-04-24 DIAGNOSIS — M51369 Other intervertebral disc degeneration, lumbar region without mention of lumbar back pain or lower extremity pain: Secondary | ICD-10-CM

## 2013-04-24 DIAGNOSIS — R0989 Other specified symptoms and signs involving the circulatory and respiratory systems: Secondary | ICD-10-CM

## 2013-04-24 DIAGNOSIS — F10929 Alcohol use, unspecified with intoxication, unspecified: Secondary | ICD-10-CM

## 2013-04-24 DIAGNOSIS — M5136 Other intervertebral disc degeneration, lumbar region: Secondary | ICD-10-CM

## 2013-04-24 DIAGNOSIS — S93409A Sprain of unspecified ligament of unspecified ankle, initial encounter: Secondary | ICD-10-CM

## 2013-04-24 DIAGNOSIS — R1013 Epigastric pain: Secondary | ICD-10-CM

## 2013-04-24 DIAGNOSIS — H5789 Other specified disorders of eye and adnexa: Secondary | ICD-10-CM

## 2013-04-24 DIAGNOSIS — R7401 Elevation of levels of liver transaminase levels: Secondary | ICD-10-CM

## 2013-04-24 DIAGNOSIS — R9431 Abnormal electrocardiogram [ECG] [EKG]: Secondary | ICD-10-CM

## 2013-04-24 DIAGNOSIS — K760 Fatty (change of) liver, not elsewhere classified: Secondary | ICD-10-CM

## 2013-04-24 DIAGNOSIS — E781 Pure hyperglyceridemia: Secondary | ICD-10-CM

## 2013-04-24 DIAGNOSIS — K859 Acute pancreatitis without necrosis or infection, unspecified: Principal | ICD-10-CM

## 2013-04-24 DIAGNOSIS — F319 Bipolar disorder, unspecified: Secondary | ICD-10-CM

## 2013-04-24 DIAGNOSIS — M25569 Pain in unspecified knee: Secondary | ICD-10-CM

## 2013-04-24 DIAGNOSIS — K219 Gastro-esophageal reflux disease without esophagitis: Secondary | ICD-10-CM

## 2013-04-24 DIAGNOSIS — D72819 Decreased white blood cell count, unspecified: Secondary | ICD-10-CM

## 2013-04-24 DIAGNOSIS — Z720 Tobacco use: Secondary | ICD-10-CM

## 2013-04-24 DIAGNOSIS — F1910 Other psychoactive substance abuse, uncomplicated: Secondary | ICD-10-CM

## 2013-04-24 DIAGNOSIS — M542 Cervicalgia: Secondary | ICD-10-CM

## 2013-04-24 LAB — COMPREHENSIVE METABOLIC PANEL
ALT: 59 U/L — ABNORMAL HIGH (ref 0–53)
AST: 151 U/L — ABNORMAL HIGH (ref 0–37)
Albumin: 3.2 g/dL — ABNORMAL LOW (ref 3.5–5.2)
Alkaline Phosphatase: 102 U/L (ref 39–117)
BUN: 13 mg/dL (ref 6–23)
CO2: 23 mEq/L (ref 19–32)
Calcium: 8 mg/dL — ABNORMAL LOW (ref 8.4–10.5)
Chloride: 89 mEq/L — ABNORMAL LOW (ref 96–112)
Creatinine, Ser: 0.89 mg/dL (ref 0.50–1.35)
GFR calc Af Amer: >90 mL/min (ref >90)
GFR calc non Af Amer: >90 mL/min (ref >90)
Glucose, Bld: 126 mg/dL — ABNORMAL HIGH (ref 70–99)
Potassium: 3.6 mEq/L (ref 3.5–5.1)
Sodium: 125 mEq/L — ABNORMAL LOW (ref 135–145)
Total Bilirubin: 0.6 mg/dL (ref 0.3–1.2)
Total Protein: 6.9 g/dL (ref 6.0–8.3)

## 2013-04-24 LAB — CBC WITH DIFFERENTIAL/PLATELET
Basophils Absolute: 0 10*3/uL (ref 0.0–0.1)
Basophils Relative: 0 % (ref 0–1)
Eosinophils Absolute: 0 10*3/uL (ref 0.0–0.7)
Eosinophils Relative: 0 % (ref 0–5)
HCT: 38.3 % — ABNORMAL LOW (ref 39.0–52.0)
Hemoglobin: 13.8 g/dL (ref 13.0–17.0)
Lymphocytes Relative: 27 % (ref 12–46)
Lymphs Abs: 1.5 10*3/uL (ref 0.7–4.0)
MCH: 33.1 pg (ref 26.0–34.0)
MCHC: 36 g/dL (ref 30.0–36.0)
MCV: 91.8 fL (ref 78.0–100.0)
Monocytes Absolute: 0.5 10*3/uL (ref 0.1–1.0)
Monocytes Relative: 9 % (ref 3–12)
Neutro Abs: 3.7 10*3/uL (ref 1.7–7.7)
Neutrophils Relative %: 64 % (ref 43–77)
Platelets: 237 10*3/uL (ref 150–400)
RBC: 4.61 MIL/uL (ref 4.22–5.81)
RDW: 14.8 % (ref 11.5–15.5)
WBC: 6.7 10*3/uL (ref 4.0–10.5)

## 2013-04-24 LAB — URINALYSIS, ROUTINE W REFLEX MICROSCOPIC
Bilirubin Urine: NEGATIVE
Glucose, UA: NEGATIVE mg/dL
Hgb urine dipstick: NEGATIVE
Ketones, ur: NEGATIVE mg/dL
Leukocytes, UA: NEGATIVE
Nitrite: NEGATIVE
Protein, ur: NEGATIVE mg/dL
Specific Gravity, Urine: >1.03 — ABNORMAL HIGH (ref 1.005–1.030)
Urobilinogen, UA: 0.2 mg/dL (ref 0.0–1.0)
pH: 5.5 (ref 5.0–8.0)

## 2013-04-24 LAB — LIPASE, BLOOD: Lipase: 247 U/L — ABNORMAL HIGH (ref 11–59)

## 2013-04-24 MED ORDER — SODIUM CHLORIDE 0.9 % IV SOLN
Freq: Once | INTRAVENOUS | Status: AC
  Administered 2013-04-24: 08:00:00 via INTRAVENOUS

## 2013-04-24 MED ORDER — ACETAMINOPHEN 650 MG RE SUPP: 650.0000 mg | Freq: Four times a day (QID) | RECTAL | Status: DC | PRN

## 2013-04-24 MED ORDER — SODIUM CHLORIDE 0.9 % IV BOLUS (SEPSIS)
1000.0000 mL | Freq: Once | INTRAVENOUS | Status: AC
  Administered 2013-04-24: 1000 mL via INTRAVENOUS

## 2013-04-24 MED ORDER — ONDANSETRON HCL 4 MG/2ML IJ SOLN
INTRAMUSCULAR | Status: AC
  Administered 2013-04-24: 4 mg via INTRAVENOUS
  Filled 2013-04-24: qty 2

## 2013-04-24 MED ORDER — PROMETHAZINE HCL 25 MG/ML IJ SOLN
12.5000 mg | INTRAMUSCULAR | Status: DC | PRN
  Administered 2013-04-28: 12.5 mg via INTRAVENOUS
  Filled 2013-04-24: qty 1

## 2013-04-24 MED ORDER — CITALOPRAM HYDROBROMIDE 20 MG PO TABS
10.0000 mg | ORAL_TABLET | Freq: Every day | ORAL | Status: DC
  Administered 2013-04-24 – 2013-05-01 (×8): 10 mg via ORAL
  Filled 2013-04-24 (×6): qty 1
  Filled 2013-04-24: qty 2
  Filled 2013-04-24: qty 1

## 2013-04-24 MED ORDER — NICOTINE 14 MG/24HR TD PT24
14.0000 mg | MEDICATED_PATCH | Freq: Every day | TRANSDERMAL | Status: DC
  Administered 2013-04-24 – 2013-05-01 (×8): 14 mg via TRANSDERMAL
  Filled 2013-04-24 (×9): qty 1

## 2013-04-24 MED ORDER — THIAMINE HCL 100 MG/ML IJ SOLN
100.0000 mg | Freq: Every day | INTRAMUSCULAR | Status: DC
  Administered 2013-04-24 – 2013-04-27 (×4): 100 mg via INTRAVENOUS
  Filled 2013-04-24 (×4): qty 2

## 2013-04-24 MED ORDER — ONDANSETRON HCL 4 MG/2ML IJ SOLN: 4.0000 mg | Freq: Once | INTRAMUSCULAR | Status: AC

## 2013-04-24 MED ORDER — TRAZODONE HCL 50 MG PO TABS
50.0000 mg | ORAL_TABLET | Freq: Every evening | ORAL | Status: DC | PRN
  Administered 2013-04-25 – 2013-04-29 (×3): 50 mg via ORAL
  Filled 2013-04-24 (×3): qty 1

## 2013-04-24 MED ORDER — SODIUM CHLORIDE 0.9 % IV SOLN
INTRAVENOUS | Status: DC
  Administered 2013-04-24: 09:00:00 via INTRAVENOUS

## 2013-04-24 MED ORDER — ALUM & MAG HYDROXIDE-SIMETH 200-200-20 MG/5ML PO SUSP
30.0000 mL | Freq: Four times a day (QID) | ORAL | Status: DC | PRN
  Administered 2013-04-28: 30 mL via ORAL
  Filled 2013-04-24: qty 30

## 2013-04-24 MED ORDER — FENTANYL CITRATE 0.05 MG/ML IJ SOLN
100.0000 ug | INTRAMUSCULAR | Status: DC | PRN
  Administered 2013-04-24 (×3): 100 ug via INTRAVENOUS
  Filled 2013-04-24 (×3): qty 2

## 2013-04-24 MED ORDER — SODIUM CHLORIDE 0.9 % IV SOLN: INTRAVENOUS | Status: DC

## 2013-04-24 MED ORDER — ALBUTEROL SULFATE HFA 108 (90 BASE) MCG/ACT IN AERS: 2.0000 | INHALATION_SPRAY | RESPIRATORY_TRACT | Status: DC | PRN

## 2013-04-24 MED ORDER — ACETAMINOPHEN 325 MG PO TABS
650.0000 mg | ORAL_TABLET | Freq: Four times a day (QID) | ORAL | Status: DC | PRN
  Administered 2013-04-25 – 2013-05-01 (×7): 650 mg via ORAL
  Filled 2013-04-24 (×7): qty 2

## 2013-04-24 MED ORDER — POTASSIUM CHLORIDE IN NACL 20-0.9 MEQ/L-% IV SOLN
INTRAVENOUS | Status: DC
  Administered 2013-04-24 – 2013-04-25 (×2): via INTRAVENOUS
  Administered 2013-04-25: 1000 mL via INTRAVENOUS
  Administered 2013-04-26 – 2013-04-27 (×6): via INTRAVENOUS
  Administered 2013-04-28: 1000 mL via INTRAVENOUS
  Administered 2013-04-28 – 2013-04-29 (×2): via INTRAVENOUS

## 2013-04-24 MED ORDER — PANTOPRAZOLE SODIUM 40 MG IV SOLR
40.0000 mg | INTRAVENOUS | Status: DC
  Administered 2013-04-24 – 2013-04-26 (×3): 40 mg via INTRAVENOUS
  Filled 2013-04-24 (×3): qty 40

## 2013-04-24 MED ORDER — TAMSULOSIN HCL 0.4 MG PO CAPS
0.4000 mg | ORAL_CAPSULE | Freq: Every day | ORAL | Status: DC
  Administered 2013-04-24 – 2013-05-01 (×8): 0.4 mg via ORAL
  Filled 2013-04-24 (×8): qty 1

## 2013-04-24 MED ORDER — SENNOSIDES-DOCUSATE SODIUM 8.6-50 MG PO TABS: 1.0000 | ORAL_TABLET | Freq: Every evening | ORAL | Status: DC | PRN

## 2013-04-24 MED ORDER — ENOXAPARIN SODIUM 40 MG/0.4ML ~~LOC~~ SOLN
40.0000 mg | SUBCUTANEOUS | Status: DC
  Administered 2013-04-24 – 2013-04-29 (×6): 40 mg via SUBCUTANEOUS
  Filled 2013-04-24 (×7): qty 0.4

## 2013-04-24 MED ORDER — HYDROMORPHONE HCL PF 1 MG/ML IJ SOLN
1.0000 mg | INTRAMUSCULAR | Status: DC | PRN
Start: 2013-04-24 — End: 2013-04-26
  Administered 2013-04-24 – 2013-04-26 (×15): 1 mg via INTRAVENOUS
  Filled 2013-04-24 (×15): qty 1

## 2013-04-24 NOTE — ED Provider Notes (Signed)
History     CSN: [id number]  Arrival date & time 04/24/13  7829   First MD Initiated Contact with Patient 04/24/13 [phone]      Chief Complaint  Patient presents with  . Abdominal Pain  . Emesis    (Consider location/radiation/quality/duration/timing/severity/associated sxs/prior treatment) HPI  Past Medical History  Diagnosis Date  . Bronchitis   . Acid reflux   . Pancreatitis     March 2013  . Gout   . Bipolar disorder   . Hyperlipidemia   . Alcohol abuse     6-8 cans of beer daily  . Pseudocyst of pancreas 02/28/2012  . Arthritis   . Fracture of lower leg   . Hypertension   . Chronic pain   . Chronic neck pain   . Chronic back pain   . Left arm pain     chronic    Past Surgical History  Procedure Laterality Date  . Nose surgery      broken nose  . Circumcision  03/17/2012    Procedure: CIRCUMCISION ADULT;  Surgeon: Ky Barban, MD;  Location: [facility];  Service: Urology;  Laterality: N/A;  . Colonoscopy with propofol  12/24/2012    Procedure: COLONOSCOPY WITH PROPOFOL;  Surgeon: Corbin Ade, MD;  Location: [facility];  Service: Endoscopy;  Laterality: N/A;  started at 0803, in cecum at 0812, withdrawel time Biopsy of anal lesion  . Esophagogastroduodenoscopy (egd) with propofol  12/24/2012    Procedure: ESOPHAGOGASTRODUODENOSCOPY (EGD) WITH PROPOFOL;  Surgeon: Corbin Ade, MD;  Location: [facility];  Service: Endoscopy;  Laterality: N/A;  done at 0800    Family History  Problem Relation Age of Onset  . Diabetes Father   . Anesthesia problems Neg Hx   . Hypotension Neg Hx   . Malignant hyperthermia Neg Hx   . Pseudochol deficiency Neg Hx   . Colon cancer Neg Hx     History  Substance Use Topics  . Smoking status: Current Every Day Smoker -- 0.50 packs/day for 25 years    Types: Cigarettes  . Smokeless tobacco: Not on file  . Alcohol Use: 0.0 oz/week     Comment: 6-8 beers a day      Review of Systems  Allergies  Penicillins  Home  Medications   Current Outpatient Rx  Name  Route  Sig  Dispense  Refill  . albuterol (PROVENTIL HFA;VENTOLIN HFA) 108 (90 BASE) MCG/ACT inhaler   Inhalation   Inhale 2 puffs into the lungs every 4 (four) hours as needed for wheezing. For asthma/wheezing   1 Inhaler   2   . amLODipine (NORVASC) 5 MG tablet   Oral   Take 1 tablet (5 mg total) by mouth daily. For high blood pressure control   30 tablet   0   . citalopram (CELEXA) 10 MG tablet   Oral   Take 10 mg by mouth daily.         . fish oil-omega-3 fatty acids 1000 MG capsule   Oral   Take 1 capsule (1 g total) by mouth daily. For cholesterol control         . gemfibrozil (LOPID) 600 MG tablet   Oral   Take 1 tablet (600 mg total) by mouth 2 (two) times daily before a meal. Cholesterol control   60 tablet   3   . ibuprofen (ADVIL,MOTRIN) 200 MG tablet   Oral   Take 600 mg by mouth every 6 (six) hours as  needed for pain.         . methocarbamol (ROBAXIN) 500 MG tablet   Oral   Take 1 tablet (500 mg total) by mouth 3 (three) times daily at 8am, 2pm and bedtime. For muscle spasms   90 tablet   0   . omeprazole (PRILOSEC) 20 MG capsule   Oral   Take 1 capsule (20 mg total) by mouth daily. For acid reflux   30 capsule   3   . prochlorperazine (COMPAZINE) 10 MG tablet   Oral   Take 1 tablet by mouth daily as needed (nausea).          . tamsulosin (FLOMAX) 0.4 MG CAPS   Oral   Take 1 capsule (0.4 mg total) by mouth daily. For enlarged prostate   30 capsule   6   . traZODone (DESYREL) 100 MG tablet   Oral   Take 1 tablet by mouth at bedtime as needed for sleep.            BP 118/87  Pulse 88  Temp(Src) 98.2 F (36.8 C) (Oral)  Resp 12  SpO2 95%  Physical Exam  ED Course  Procedures (including critical care time)  Medications  0.9 %  sodium chloride infusion ( Intravenous New Bag/Given 04/24/13 0846)  fentaNYL (SUBLIMAZE) injection 100 mcg (100 mcg Intravenous Given 04/24/13 1244)   ondansetron (ZOFRAN) injection 4 mg (4 mg Intravenous Given 04/24/13 0757)  0.9 %  sodium chloride infusion ( Intravenous Stopped 04/24/13 0815)  sodium chloride 0.9 % bolus 1,000 mL (0 mLs Intravenous Stopped 04/24/13 0845)   Patient Vitals for the past 24 hrs:  BP Temp Temp src Pulse Resp SpO2  04/24/13 1319 118/87 mmHg - - 88 12 95 %  04/24/13 1100 127/88 mmHg - - 89 - 98 %  04/24/13 1000 119/83 mmHg - - 93 - 95 %  04/24/13 0900 123/92 mmHg - - 90 - 94 %  04/24/13 0847 129/87 mmHg - - 90 16 95 %  04/24/13 0746 141/97 mmHg 98.2 F (36.8 C) Oral 100 20 98 %    1:56 PM Reevaluation with update and discussion. After initial assessment and treatment, an updated evaluation reveals he continues to have pain despite multiple treatments with fentanyl. He has been admitted previously with the same problem.Luisfelipe Engel[address] L   Labs Reviewed  CBC WITH DIFFERENTIAL - Abnormal; Notable for the following:    HCT 38.3 (*)    All other components within normal limits  LIPASE, BLOOD - Abnormal; Notable for the following:    Lipase 247 (*)    All other components within normal limits  COMPREHENSIVE METABOLIC PANEL - Abnormal; Notable for the following:    Sodium 125 (*)    Chloride 89 (*)    Glucose, Bld 126 (*)    Calcium 8.0 (*)    Albumin 3.2 (*)    AST 151 (*)    ALT 59 (*)    All other components within normal limits  URINALYSIS, ROUTINE W REFLEX MICROSCOPIC - Abnormal; Notable for the following:    Specific Gravity, Urine >1.030 (*)    All other components within normal limits   Dg Abd Acute W/chest  04/24/2013  *RADIOLOGY REPORT*  Clinical Data: severe abdominal pain  ACUTE ABDOMEN SERIES (ABDOMEN 2 VIEW & CHEST 1 VIEW)  Comparison: 02/15/2013, 03/14/2011  Findings: Normal heart size and vascularity.  Lungs remain clear. Negative for edema, collapse, consolidation, pneumonia, effusion or pneumothorax.  Trachea is midline.  No  free air.  Scattered air and stool throughout the bowel.  No  significant dilatation or obstruction pattern.  IMPRESSION: No acute finding by plain radiography   Original Report Authenticated By: Judie Petit. Shick, M.D.      1. Abdominal pain   2. Cocaine abuse       MDM  Nonspecific abdominal pain with elevated lipase. Differential diagnosis includes pancreatitis, nonspecific intestinal cramping, and bowel ischemia secondary to cocaine abuse. Colitis, appendicitis, intestinal infarction. Patient will need to be admitted for stabilization.  Nursing Notes Reviewed/ Care Coordinated, and agree without changes. Applicable Imaging Reviewed.  Interpretation of Laboratory Data incorporated into ED treatment   Plan: Admit to the hospitalist service.        Flint Melter, MD 04/24/13 [phone]

## 2013-04-24 NOTE — ED Notes (Addendum)
Pt presents to er with c/o lower abd pain that started yesterday, associated with n/v, states that he did drink a pint of alcohol and used $20.00 worth of cocaine yesterday. Has hx of pancreatitis in the past. States that the pain is similar Dr. Effie Shy at bedside, pt also was seen in er during the night and left ama states due to "personal reasons".

## 2013-04-24 NOTE — ED Notes (Addendum)
Pt resting with eyes closed when I entered the room will arouse with verbal stimuli, states that his pain is not any better at present and that he needs to stay in the hospital, he does not feel that he is able to go home, Dr. Effie Shy notified,

## 2013-04-24 NOTE — H&P (Signed)
Hospital Admission Note Date: 04/24/2013  Patient name: [name] [name] Medical record number: [id number] Date of birth: December 14, 1961 Age: 52 y.o. Gender: male PCP: Milinda Antis, MD  Attending physician: Flint Melter, MD  Chief Complaint: Abdominal pain and vomiting  History of Present Illness:  [name] [name] is an 52 y.o. male with a history of recurrent admissions for alcoholic pancreatitis. He has been drinking again and using cocaine. He comes in with epigastric pain and vomiting. Symptoms began last night. He's been drinking about a pint of liquor a day. Smokes cocaine and cigarettes. He's had no hematemesis. No fevers or chills. He has had multiple social work interventions and has been to behavioral health for drug treatment earlier this year. In the emergency room, he was found to have an elevated lipase. His pain is uncontrolled after several doses of fentanyl.  Past Medical History  Diagnosis Date  . Bronchitis   . Acid reflux   . Pancreatitis     March 2013  . Gout   . Bipolar disorder   . Hyperlipidemia   . Alcohol abuse     6-8 cans of beer daily  . Pseudocyst of pancreas 02/28/2012  . Arthritis   . Fracture of lower leg   . Hypertension   . Chronic pain   . Chronic neck pain   . Chronic back pain   . Left arm pain     chronic    Meds: See home medication list.  Allergies: Penicillins History   Social History  . Marital Status: Single    Spouse Name: N/A    Number of Children: N/A  . Years of Education: N/A   Occupational History  . unemployed    Social History Main Topics  . Smoking status: Current Every Day Smoker -- 0.50 packs/day for 25 years    Types: Cigarettes  . Smokeless tobacco: Not on file  . Alcohol Use: 0.0 oz/week     Comment: 6-8 beers a day  . Drug Use: Yes    Special: Cocaine, Marijuana     Comment: hx of marijuana and cocaine use last used cocaine 2 weeks ago  . Sexually Active: Not Currently   Other Topics Concern  . Not on file    Social History Narrative  . No narrative on file   Family History  Problem Relation Age of Onset  . Diabetes Father   . Anesthesia problems Neg Hx   . Hypotension Neg Hx   . Malignant hyperthermia Neg Hx   . Pseudochol deficiency Neg Hx   . Colon cancer Neg Hx    Past Surgical History  Procedure Laterality Date  . Nose surgery      broken nose  . Circumcision  03/17/2012    Procedure: CIRCUMCISION ADULT;  Surgeon: Ky Barban, MD;  Location: [facility];  Service: Urology;  Laterality: N/A;  . Colonoscopy with propofol  12/24/2012    Procedure: COLONOSCOPY WITH PROPOFOL;  Surgeon: Corbin Ade, MD;  Location: [facility];  Service: Endoscopy;  Laterality: N/A;  started at 0803, in cecum at 0812, withdrawel time Biopsy of anal lesion  . Esophagogastroduodenoscopy (egd) with propofol  12/24/2012    Procedure: ESOPHAGOGASTRODUODENOSCOPY (EGD) WITH PROPOFOL;  Surgeon: Corbin Ade, MD;  Location: [facility];  Service: Endoscopy;  Laterality: N/A;  done at 0800    Review of Systems: Systems reviewed and as per HPI, otherwise negative.  Physical Exam: Blood pressure 118/87, pulse 88, temperature 98.2 F (36.8 C), temperature  source Oral, resp. rate 12, SpO2 95.00%. BP 118/87  Pulse 88  Temp(Src) 98.2 F (36.8 C) (Oral)  Resp 12  SpO2 95%  General Appearance:    Alert, cooperative. uncomfortable, occasionally wincing in pain.   Head:    Normocephalic, without obvious abnormality, atraumatic  Eyes:    PERRL, conjunctiva/corneas clear, EOM's intact, fundi    benign, both eyes       Ears:    Normal TM's and external ear canals, both ears  Nose:   Nares normal, septum midline, mucosa normal, no drainage    or sinus tenderness  Throat:   Lips, mucosa, and tongue normal; teeth and gums normal  Neck:   Supple, symmetrical, trachea midline, no adenopathy;       thyroid:  No enlargement/tenderness/nodules; no carotid   bruit or JVD  Back:     Symmetric, no curvature, ROM normal, no  CVA tenderness  Lungs:     Clear to auscultation bilaterally, respirations unlabored  Chest wall:    No tenderness or deformity  Heart:    Regular rate and rhythm, S1 and S2 normal, no murmur, rub   or gallop  Abdomen:     Soft, tender in the epigastrium. Nondistended. Normal bowel sounds.   Genitalia:   deferred   Rectal:   deferred   Extremities:   Extremities normal, atraumatic, no cyanosis or edema  Pulses:   2+ and symmetric all extremities  Skin:   Skin color, texture, turgor normal, no rashes or lesions  Lymph nodes:   Cervical, supraclavicular, and axillary nodes normal  Neurologic:   CNII-XII intact. Normal strength, sensation and reflexes      throughout    Psychiatric: Calm and cooperative.  Lab results: Basic Metabolic Panel:  Recent Labs  16/10/96 0805  NA 125*  K 3.6  CL 89*  CO2 23  GLUCOSE 126*  BUN 13  CREATININE 0.89  CALCIUM 8.0*   Liver Function Tests:  Recent Labs  04/24/13 0805  AST 151*  ALT 59*  ALKPHOS 102  BILITOT PENDING  PROT 6.9  ALBUMIN 3.2*    Recent Labs  04/24/13 0805  LIPASE 247*   No results found for this basename: AMMONIA,  in the last 72 hours CBC:  Recent Labs  04/24/13 0805  WBC 6.7  NEUTROABS 3.7  HGB 13.8  HCT 38.3*  MCV 91.8  PLT 237   Urinalysis:  Recent Labs  04/24/13 1013  COLORURINE YELLOW  LABSPEC >1.030*  PHURINE 5.5  GLUCOSEU NEGATIVE  HGBUR NEGATIVE  BILIRUBINUR NEGATIVE  KETONESUR NEGATIVE  PROTEINUR NEGATIVE  UROBILINOGEN 0.2  NITRITE NEGATIVE  LEUKOCYTESUR NEGATIVE   Imaging results:  Dg Abd Acute W/chest  04/24/2013  *RADIOLOGY REPORT*  Clinical Data: severe abdominal pain  ACUTE ABDOMEN SERIES (ABDOMEN 2 VIEW & CHEST 1 VIEW)  Comparison: 02/15/2013, 03/14/2011  Findings: Normal heart size and vascularity.  Lungs remain clear. Negative for edema, collapse, consolidation, pneumonia, effusion or pneumothorax.  Trachea is midline.  No free air.  Scattered air and stool throughout the  bowel.  No significant dilatation or obstruction pattern.  IMPRESSION: No acute finding by plain radiography   Original Report Authenticated By: Judie Petit. Miles Costain, M.D.     Assessment & Plan: Principal Problem:   Acute alcoholic pancreatitis Active Problems:   Hypertriglyceridemia   Essential hypertension, benign   Hyponatremia   GERD (gastroesophageal reflux disease)   BPH (benign prostatic hyperplasia)   Polysubstance abuse  Again, patient has been counseled to quit  his polysubstance use. He will be admitted for IV hydration, pain control, anti-emetics, bowel rest. Hold most of his oral medications while n.p.o. Will resume proton X. Give thiamine. No evidence of withdrawal currently. Monitor sodium. This should improve with hydration. Hold antihypertensives for now.  Apolo Cutshaw L 04/24/2013, 2:34 PM

## 2013-04-24 NOTE — ED Notes (Addendum)
Pt standing up to use urinal, tolerated well, asking tech for additional pain medication, went into room, pt had changed back into his clothes, explained to pt that he would need to be placed back into a hospital gown for admission, pt states that he will change when he gets upstairs, pt requesting pain medication, explained to pt that he had just been given pain medication, pt states that he does not remember. Comfort measure provided to pt,

## 2013-04-24 NOTE — ED Notes (Signed)
Patient states that he does not want to see DR. Colon Branch

## 2013-04-24 NOTE — ED Notes (Signed)
Pt given gingerale,

## 2013-04-24 NOTE — ED Notes (Signed)
Pt tolerating po fluids,  

## 2013-04-24 NOTE — ED Notes (Signed)
Pt resting with eyes closed, will arouse when spoken to 

## 2013-04-24 NOTE — ED Notes (Signed)
Pt c/o lower abdominal pain and nausea since last night.

## 2013-04-24 NOTE — ED Notes (Signed)
Pt standing up in room, yelling in pain, pain medication given per prn order. Pt and family updated on plan of care and delay,

## 2013-04-24 NOTE — ED Notes (Signed)
Patient states that he started having lower abdominal pain this morning with nausea and vomiting.  States that he has had this problem in the past and it was related to "cholesterol."

## 2013-04-24 NOTE — ED Provider Notes (Signed)
History  This chart was scribed for [name] Melter, MD by Bennett Scrape, ED Scribe. This patient was seen in room [facility] and the patient's care was started at 7:49 AM.  CSN: [id number]  Arrival date & time 04/24/13  0736   First MD Initiated Contact with Patient 04/24/13 [phone]      Chief Complaint  Patient presents with  . Abdominal Pain  . Emesis     The history is provided by the patient. No language interpreter was used.    HPI Comments: [name] [name] is a 52 y.o. male with a h/o pancreatitis who presents to the Emergency Department complaining of gradual onset, gradually worsening, constant diffuse abdominal pain with associated nausea and 7 to 8 episodes of yellow emesis that started yesterday. He rates his pain as severe currently. He has a h/o cocaine and alcohol abuse and admits to drinking EtOH and using cocaine yesterday. Last episode of pancreatitis was in January 2014. He denies constipation, diarrhea and urinary symptoms as associated symptoms. He has a h/o GERD, HLD and chronic pain. Pt is a current everyday smoker.   PCP is Dr. Jeanice Lim  Per nursing note, pt checked in at 3:40 AM this morning for the same but left at 4:07 AM, because he did not want to see Dr. Colon Branch.  Past Medical History  Diagnosis Date  . Bronchitis   . Acid reflux   . Pancreatitis     March 2013  . Gout   . Bipolar disorder   . Hyperlipidemia   . Alcohol abuse     6-8 cans of beer daily  . Pseudocyst of pancreas 02/28/2012  . Arthritis   . Fracture of lower leg   . Hypertension   . Chronic pain   . Chronic neck pain   . Chronic back pain   . Left arm pain     chronic    Past Surgical History  Procedure Laterality Date  . Nose surgery      broken nose  . Circumcision  03/17/2012    Procedure: CIRCUMCISION ADULT;  Surgeon: Ky Barban, MD;  Location: [facility];  Service: Urology;  Laterality: N/A;  . Colonoscopy with propofol  12/24/2012    Procedure: COLONOSCOPY WITH PROPOFOL;   Surgeon: Corbin Ade, MD;  Location: [facility];  Service: Endoscopy;  Laterality: N/A;  started at 0803, in cecum at 0812, withdrawel time Biopsy of anal lesion  . Esophagogastroduodenoscopy (egd) with propofol  12/24/2012    Procedure: ESOPHAGOGASTRODUODENOSCOPY (EGD) WITH PROPOFOL;  Surgeon: Corbin Ade, MD;  Location: [facility];  Service: Endoscopy;  Laterality: N/A;  done at 0800    Family History  Problem Relation Age of Onset  . Diabetes Father   . Anesthesia problems Neg Hx   . Hypotension Neg Hx   . Malignant hyperthermia Neg Hx   . Pseudochol deficiency Neg Hx   . Colon cancer Neg Hx     History  Substance Use Topics  . Smoking status: Current Every Day Smoker -- 0.50 packs/day for 25 years    Types: Cigarettes  . Smokeless tobacco: Not on file  . Alcohol Use: 0.0 oz/week     Comment: 6-8 beers a day      Review of Systems  Constitutional: Negative for fever and chills.  Gastrointestinal: Positive for nausea, vomiting and abdominal pain. Negative for diarrhea and constipation.  All other systems reviewed and are negative.    Allergies  Penicillins  Home Medications  Current Outpatient Rx  Name  Route  Sig  Dispense  Refill  . albuterol (PROVENTIL HFA;VENTOLIN HFA) 108 (90 BASE) MCG/ACT inhaler   Inhalation   Inhale 2 puffs into the lungs every 4 (four) hours as needed for wheezing. For asthma/wheezing   1 Inhaler   2   . amLODipine (NORVASC) 5 MG tablet   Oral   Take 1 tablet (5 mg total) by mouth daily. For high blood pressure control   30 tablet   0   . citalopram (CELEXA) 10 MG tablet   Oral   Take 10 mg by mouth daily.         . fish oil-omega-3 fatty acids 1000 MG capsule   Oral   Take 1 capsule (1 g total) by mouth daily. For cholesterol control         . gemfibrozil (LOPID) 600 MG tablet   Oral   Take 1 tablet (600 mg total) by mouth 2 (two) times daily before a meal. Cholesterol control   60 tablet   3   . omeprazole  (PRILOSEC) 20 MG capsule   Oral   Take 1 capsule (20 mg total) by mouth daily. For acid reflux   30 capsule   3   . prochlorperazine (COMPAZINE) 10 MG tablet   Oral   Take 1 tablet by mouth daily as needed (nausea).          . tamsulosin (FLOMAX) 0.4 MG CAPS   Oral   Take 1 capsule (0.4 mg total) by mouth daily. For enlarged prostate   30 capsule   6   . traZODone (DESYREL) 100 MG tablet   Oral   Take 1 tablet by mouth at bedtime as needed for sleep.          Marland Kitchen lipase/protease/amylase (CREON-12/PANCREASE) 12000 UNITS CPEP   Oral   Take 4 capsules by mouth 3 (three) times daily before meals.   270 capsule   0   . senna-docusate (SENOKOT-S) 8.6-50 MG per tablet   Oral   Take 2 tablets by mouth daily.   30 tablet   0   . thiamine 100 MG tablet   Oral   Take 1 tablet (100 mg total) by mouth daily.   30 tablet   0     Triage Vitals: BP 141/97  Pulse 100  Temp(Src) 98.2 F (36.8 C) (Oral)  Resp 20  SpO2 98%  Physical Exam  Nursing note and vitals reviewed. Constitutional: He is oriented to person, place, and time. He appears well-developed and well-nourished.  Pt is moaning and writhing on the bed in pain  HENT:  Head: Normocephalic and atraumatic.  Right Ear: External ear normal.  Left Ear: External ear normal.  Eyes: Conjunctivae and EOM are normal. Pupils are equal, round, and reactive to light.  Neck: Normal range of motion and phonation normal. Neck supple.  Cardiovascular: Normal rate, regular rhythm, normal heart sounds and intact distal pulses.   Pulmonary/Chest: Effort normal and breath sounds normal. He exhibits no bony tenderness.  Abdominal: Soft. Normal appearance. Bowel sounds are decreased. There is tenderness (diffuse tenderness of the abdomen to palpation ). There is no rebound and no guarding.  Musculoskeletal: Normal range of motion.  Neurological: He is alert and oriented to person, place, and time. He has normal strength. No cranial  nerve deficit or sensory deficit. He exhibits normal muscle tone. Coordination normal.  Skin: Skin is warm, dry and intact.  Psychiatric: He has a normal  mood and affect. His behavior is normal. Judgment and thought content normal.    ED Course  Procedures (including critical care time)  Medications  bisacodyl (DULCOLAX) suppository 10 mg (10 mg Rectal Not Given 04/27/13 1027)  iohexol (OMNIPAQUE) 300 MG/ML solution 50 mL (50 mLs Oral Contrast Given 04/29/13 0901)  ondansetron (ZOFRAN) injection 4 mg (4 mg Intravenous Given 04/24/13 0757)  0.9 %  sodium chloride infusion ( Intravenous Stopped 04/24/13 0815)  sodium chloride 0.9 % bolus 1,000 mL (0 mLs Intravenous Stopped 04/24/13 0845)  potassium chloride 10 mEq in 100 mL IVPB (10 mEq Intravenous Given 04/26/13 1004)  potassium chloride 10 mEq in 100 mL IVPB (10 mEq Intravenous Given 04/26/13 1200)  iohexol (OMNIPAQUE) 300 MG/ML solution 50 mL (50 mLs Oral Contrast Given 04/26/13 1955)  iohexol (OMNIPAQUE) 300 MG/ML solution 100 mL (100 mLs Intravenous Contrast Given 04/26/13 1958)  potassium chloride 10 mEq in 100 mL IVPB (10 mEq Intravenous Given 04/27/13 1115)  iohexol (OMNIPAQUE) 300 MG/ML solution 100 mL (100 mLs Intravenous Contrast Given 04/29/13 1237)    DIAGNOSTIC STUDIES: Oxygen Saturation is 98% on room air, normal by my interpretation.    COORDINATION OF CARE: 7:52 AM-Advised pt that cocaine use can cause abdominal pain. Discussed treatment plan which includes Zofran, IV fluids, fentanyl, abdomen xray, CBC, CMP and UA with pt at bedside and pt agreed to plan.   13:50-Consult complete with Dr. Lendell Caprice. Patient case explained and discussed. She agrees to admit patient for further evaluation and treatment. Call ended at 13:54    Labs Reviewed  CBC WITH DIFFERENTIAL - Abnormal; Notable for the following:    HCT 38.3 (*)    All other components within normal limits  LIPASE, BLOOD - Abnormal; Notable for the following:    Lipase 247 (*)    All  other components within normal limits  COMPREHENSIVE METABOLIC PANEL - Abnormal; Notable for the following:    Sodium 125 (*)    Chloride 89 (*)    Glucose, Bld 126 (*)    Calcium 8.0 (*)    Albumin 3.2 (*)    AST 151 (*)    ALT 59 (*)    All other components within normal limits  URINALYSIS, ROUTINE W REFLEX MICROSCOPIC - Abnormal; Notable for the following:    Specific Gravity, Urine >1.030 (*)    All other components within normal limits  COMPREHENSIVE METABOLIC PANEL - Abnormal; Notable for the following:    Sodium 127 (*)    Chloride 93 (*)    Calcium 7.0 (*)    Albumin 2.6 (*)    AST 108 (*)    All other components within normal limits  LIPASE, BLOOD - Abnormal; Notable for the following:    Lipase 80 (*)    All other components within normal limits  COMPREHENSIVE METABOLIC PANEL - Abnormal; Notable for the following:    Sodium 130 (*)    Potassium 3.4 (*)    Glucose, Bld 107 (*)    BUN 4 (*)    Calcium 7.5 (*)    Albumin 2.7 (*)    AST 92 (*)    All other components within normal limits  LIPASE, BLOOD - Abnormal; Notable for the following:    Lipase 70 (*)    All other components within normal limits  BASIC METABOLIC PANEL - Abnormal; Notable for the following:    Sodium 132 (*)    Potassium 3.3 (*)    Glucose, Bld 101 (*)  BUN <3 (*)    Calcium 7.4 (*)    All other components within normal limits  CBC WITH DIFFERENTIAL - Abnormal; Notable for the following:    WBC 13.3 (*)    RBC 3.72 (*)    Hemoglobin 12.0 (*)    HCT 35.0 (*)    RDW 15.6 (*)    Neutrophils Relative % 84 (*)    Neutro Abs 11.2 (*)    Lymphocytes Relative 9 (*)    All other components within normal limits  COMPREHENSIVE METABOLIC PANEL - Abnormal; Notable for the following:    BUN <3 (*)    Calcium 7.8 (*)    Albumin 2.6 (*)    AST 40 (*)    All other components within normal limits  CBC WITH DIFFERENTIAL - Abnormal; Notable for the following:    RBC 3.52 (*)    Hemoglobin 11.4 (*)     HCT 33.5 (*)    RDW 16.2 (*)    Neutrophils Relative % 79 (*)    Lymphocytes Relative 11 (*)    Neutro Abs 8.0 (*)    All other components within normal limits  COMPREHENSIVE METABOLIC PANEL - Abnormal; Notable for the following:    Potassium 3.2 (*)    BUN <3 (*)    Albumin 2.6 (*)    All other components within normal limits  CBC - Abnormal; Notable for the following:    RBC 3.73 (*)    Hemoglobin 12.1 (*)    HCT 34.9 (*)    RDW 16.0 (*)    All other components within normal limits  CBC - Abnormal; Notable for the following:    RBC 3.65 (*)    Hemoglobin 11.8 (*)    HCT 33.9 (*)    RDW 16.2 (*)    All other components within normal limits  COMPREHENSIVE METABOLIC PANEL - Abnormal; Notable for the following:    Sodium 133 (*)    Potassium 3.4 (*)    BUN 3 (*)    Albumin 2.5 (*)    All other components within normal limits  CBC - Abnormal; Notable for the following:    RBC 3.52 (*)    Hemoglobin 11.6 (*)    HCT 32.6 (*)    RDW 15.9 (*)    Platelets 469 (*)    All other components within normal limits  COMPREHENSIVE METABOLIC PANEL - Abnormal; Notable for the following:    Sodium 133 (*)    Albumin 2.5 (*)    All other components within normal limits  LIPASE, BLOOD  LIPASE, BLOOD   Dg Abd Acute W/chest  04/24/2013  *RADIOLOGY REPORT*  Clinical Data: severe abdominal pain  ACUTE ABDOMEN SERIES (ABDOMEN 2 VIEW & CHEST 1 VIEW)  Comparison: 02/15/2013, 03/14/2011  Findings: Normal heart size and vascularity.  Lungs remain clear. Negative for edema, collapse, consolidation, pneumonia, effusion or pneumothorax.  Trachea is midline.  No free air.  Scattered air and stool throughout the bowel.  No significant dilatation or obstruction pattern.  IMPRESSION: No acute finding by plain radiography   Original Report Authenticated By: Judie Petit. Shick, M.D.    Nursing Notes Reviewed/ Care Coordinated Applicable Imaging Reviewed Interpretation of Laboratory Data incorporated into ED  treatment Radiologic imaging report reviewed and images by radiology- viewed, by me.  DX: 1. abdominal pain 2. Cocaine Abuse    MDM  Abdominal pain, with cocaine abuse; symptoms are recurrent. Patient is not improved ED treatment and required admission for further treatment,  Plan: Admit to hospitalist    I personally performed the services described in this documentation, which was scribed in my presence. The recorded information has been reviewed and is accurate.         [name] Melter, MD 05/05/13 [phone]

## 2013-04-24 NOTE — ED Notes (Signed)
The patient also states he has a history of pancreatitis and has been drinking recently.

## 2013-04-25 LAB — COMPREHENSIVE METABOLIC PANEL
ALT: 45 U/L (ref 0–53)
AST: 108 U/L — ABNORMAL HIGH (ref 0–37)
Albumin: 2.6 g/dL — ABNORMAL LOW (ref 3.5–5.2)
Alkaline Phosphatase: 89 U/L (ref 39–117)
BUN: 9 mg/dL (ref 6–23)
CO2: 25 mEq/L (ref 19–32)
Calcium: 7 mg/dL — ABNORMAL LOW (ref 8.4–10.5)
Chloride: 93 mEq/L — ABNORMAL LOW (ref 96–112)
Creatinine, Ser: 0.85 mg/dL (ref 0.50–1.35)
GFR calc Af Amer: >90 mL/min (ref >90)
GFR calc non Af Amer: >90 mL/min (ref >90)
Glucose, Bld: 93 mg/dL (ref 70–99)
Potassium: 3.6 mEq/L (ref 3.5–5.1)
Sodium: 127 mEq/L — ABNORMAL LOW (ref 135–145)
Total Bilirubin: 1 mg/dL (ref 0.3–1.2)
Total Protein: 6.2 g/dL (ref 6.0–8.3)

## 2013-04-25 LAB — LIPASE, BLOOD: Lipase: 80 U/L — ABNORMAL HIGH (ref 11–59)

## 2013-04-25 MED ORDER — LORAZEPAM 2 MG/ML IJ SOLN
1.0000 mg | INTRAMUSCULAR | Status: DC | PRN
Start: 2013-04-25 — End: 2013-05-01

## 2013-04-25 MED ORDER — SENNOSIDES-DOCUSATE SODIUM 8.6-50 MG PO TABS
2.0000 | ORAL_TABLET | Freq: Every day | ORAL | Status: DC
  Administered 2013-04-25 – 2013-05-01 (×7): 2 via ORAL
  Filled 2013-04-25 (×7): qty 2

## 2013-04-25 MED ORDER — GEMFIBROZIL 600 MG PO TABS
600.0000 mg | ORAL_TABLET | Freq: Two times a day (BID) | ORAL | Status: DC
  Administered 2013-04-25: 600 mg via ORAL
  Filled 2013-04-25: qty 1

## 2013-04-25 NOTE — Progress Notes (Signed)
Pt ambulated 300 ft. Tolerated well. Will continue to monitor. [place], Arva Chafe

## 2013-04-25 NOTE — Progress Notes (Signed)
Subjective: Still with pain.  Thirsty.  Would Like to try a clear liquid tray. No withdrawal symptoms.  Objective: Vital signs in last 24 hours: Filed Vitals:   04/24/13 1545 04/24/13 2101 04/24/13 2105 04/25/13 0458  BP: 149/98 121/86  112/77  Pulse: 91 98 97 97  Temp: 97.7 F (36.5 C) 97.8 F (36.6 C)  98 F (36.7 C)  TempSrc: Oral Oral  Oral  Resp: 16 18 20 20   Height: 5\' 10"  (1.778 m)     Weight: 77.9 kg (171 lb 11.8 oz)     SpO2: 96% 95% 97% 93%   Weight change:   Intake/Output Summary (Last 24 hours) at 04/25/13 1016 Last data filed at 04/24/13 1530  Gross per 24 hour  Intake    700 ml  Output      0 ml  Net    700 ml   General: Calm, cooperative. Talking on the telephone. Lungs clear to auscultation bilaterally without wheeze rhonchi or rales Cardiovascular regular rate rhythm without murmurs gallops rubs Abdomen soft. Nondistended. Epigastrium is tender. Extremities no clubbing cyanosis or edema Psychiatric normal affect.  Lab Results: Basic Metabolic Panel:  Recent Labs Lab 04/24/13 0805 04/25/13 0614  NA 125* 127*  K 3.6 3.6  CL 89* 93*  CO2 23 25  GLUCOSE 126* 93  BUN 13 9  CREATININE 0.89 0.85  CALCIUM 8.0* 7.0*   Liver Function Tests:  Recent Labs Lab 04/24/13 0805 04/25/13 0614  AST 151* 108*  ALT 59* PENDING  ALKPHOS 102 89  BILITOT 0.6 1.0  PROT 6.9 6.2  ALBUMIN 3.2* 2.6*    Recent Labs Lab 04/24/13 0805 04/25/13 0614  LIPASE 247* 80*   No results found for this basename: AMMONIA,  in the last 168 hours CBC:  Recent Labs Lab 04/24/13 0805  WBC 6.7  NEUTROABS 3.7  HGB 13.8  HCT 38.3*  MCV 91.8  PLT 237   Cardiac Enzymes: No results found for this basename: CKTOTAL, CKMB, CKMBINDEX, TROPONINI,  in the last 168 hours BNP: No results found for this basename: PROBNP,  in the last 168 hours D-Dimer: No results found for this basename: DDIMER,  in the last 168 hours CBG: No results found for this basename: GLUCAP,  in  the last 168 hours Hemoglobin A1C: No results found for this basename: HGBA1C,  in the last 168 hours Fasting Lipid Panel: No results found for this basename: CHOL, HDL, LDLCALC, TRIG, CHOLHDL, LDLDIRECT,  in the last 168 hours Thyroid Function Tests: No results found for this basename: TSH, T4TOTAL, FREET4, T3FREE, THYROIDAB,  in the last 168 hours Coagulation: No results found for this basename: LABPROT, INR,  in the last 168 hours Anemia Panel: No results found for this basename: VITAMINB12, FOLATE, FERRITIN, TIBC, IRON, RETICCTPCT,  in the last 168 hours Urine Drug Screen: Drugs of Abuse     Component Value Date/Time   LABOPIA NONE DETECTED 04/04/2013 1250   COCAINSCRNUR POSITIVE* 04/04/2013 1250   COCAINSCRNUR NEG 08/06/2012 1531   LABBENZ NONE DETECTED 04/04/2013 1250   LABBENZ NEG 08/06/2012 1531   AMPHETMU NONE DETECTED 04/04/2013 1250   AMPHETMU NEG 08/06/2012 1531   THCU NONE DETECTED 04/04/2013 1250   LABBARB NONE DETECTED 04/04/2013 1250    Alcohol Level: No results found for this basename: ETH,  in the last 168 hours Urinalysis:  Recent Labs Lab 04/24/13 1013  COLORURINE YELLOW  LABSPEC >1.030*  PHURINE 5.5  GLUCOSEU NEGATIVE  HGBUR NEGATIVE  BILIRUBINUR NEGATIVE  KETONESUR NEGATIVE  PROTEINUR NEGATIVE  UROBILINOGEN 0.2  NITRITE NEGATIVE  LEUKOCYTESUR NEGATIVE   Micro Results: No results found for this or any previous visit (from the past 240 hour(s)). Studies/Results: Dg Abd Acute W/chest  04/24/2013  *RADIOLOGY REPORT*  Clinical Data: severe abdominal pain  ACUTE ABDOMEN SERIES (ABDOMEN 2 VIEW & CHEST 1 VIEW)  Comparison: 02/15/2013, 03/14/2011  Findings: Normal heart size and vascularity.  Lungs remain clear. Negative for edema, collapse, consolidation, pneumonia, effusion or pneumothorax.  Trachea is midline.  No free air.  Scattered air and stool throughout the bowel.  No significant dilatation or obstruction pattern.  IMPRESSION: No acute finding by plain  radiography   Original Report Authenticated By: Judie Petit. Shick, M.D.    Scheduled Meds: . citalopram  10 mg Oral Daily  . enoxaparin (LOVENOX) injection  40 mg Subcutaneous Q24H  . nicotine  14 mg Transdermal Daily  . pantoprazole (PROTONIX) IV  40 mg Intravenous Q24H  . tamsulosin  0.4 mg Oral Daily  . thiamine  100 mg Intravenous Daily   Continuous Infusions: . 0.9 % NaCl with KCl 20 mEq / L 125 mL/hr at 04/25/13 0025   PRN Meds:.acetaminophen, acetaminophen, albuterol, alum & mag hydroxide-simeth, HYDROmorphone (DILAUDID) injection, promethazine, senna-docusate, traZODone Assessment/Plan: Principal Problem:   Acute alcoholic pancreatitis Active Problems:   Hypertriglyceridemia   Essential hypertension, benign   Hyponatremia   GERD (gastroesophageal reflux disease)   BPH (benign prostatic hyperplasia)   Polysubstance abuse  Start clears. No evidence of withdrawal. Continue IV fluid. Social work consult.   LOS: 1 day   Kahiau Schewe L 04/25/2013, 10:16 AM

## 2013-04-26 ENCOUNTER — Ambulatory Visit (HOSPITAL_COMMUNITY): Payer: Self-pay | Admitting: Oncology

## 2013-04-26 ENCOUNTER — Inpatient Hospital Stay (HOSPITAL_COMMUNITY): Payer: Medicare Other

## 2013-04-26 DIAGNOSIS — R0600 Dyspnea, unspecified: Secondary | ICD-10-CM | POA: Diagnosis not present

## 2013-04-26 DIAGNOSIS — E876 Hypokalemia: Secondary | ICD-10-CM | POA: Diagnosis not present

## 2013-04-26 LAB — COMPREHENSIVE METABOLIC PANEL
ALT: 35 U/L (ref 0–53)
AST: 92 U/L — ABNORMAL HIGH (ref 0–37)
Albumin: 2.7 g/dL — ABNORMAL LOW (ref 3.5–5.2)
Alkaline Phosphatase: 117 U/L (ref 39–117)
BUN: 4 mg/dL — ABNORMAL LOW (ref 6–23)
CO2: 26 mEq/L (ref 19–32)
Calcium: 7.5 mg/dL — ABNORMAL LOW (ref 8.4–10.5)
Chloride: 96 mEq/L (ref 96–112)
Creatinine, Ser: 0.71 mg/dL (ref 0.50–1.35)
GFR calc Af Amer: >90 mL/min (ref >90)
GFR calc non Af Amer: >90 mL/min (ref >90)
Glucose, Bld: 107 mg/dL — ABNORMAL HIGH (ref 70–99)
Potassium: 3.4 mEq/L — ABNORMAL LOW (ref 3.5–5.1)
Sodium: 130 mEq/L — ABNORMAL LOW (ref 135–145)
Total Bilirubin: 1.2 mg/dL (ref 0.3–1.2)
Total Protein: 6.7 g/dL (ref 6.0–8.3)

## 2013-04-26 LAB — LIPASE, BLOOD: Lipase: 70 U/L — ABNORMAL HIGH (ref 11–59)

## 2013-04-26 MED ORDER — BISACODYL 10 MG RE SUPP: 10.0000 mg | Freq: Every day | RECTAL | Status: DC | PRN

## 2013-04-26 MED ORDER — BISACODYL 10 MG RE SUPP
10.0000 mg | Freq: Every day | RECTAL | Status: AC
  Administered 2013-04-26: 10 mg via RECTAL
  Filled 2013-04-26 (×2): qty 1

## 2013-04-26 MED ORDER — IOHEXOL 300 MG/ML  SOLN
100.0000 mL | Freq: Once | INTRAMUSCULAR | Status: AC | PRN
  Administered 2013-04-26: 100 mL via INTRAVENOUS

## 2013-04-26 MED ORDER — HYDROMORPHONE HCL PF 1 MG/ML IJ SOLN
1.0000 mg | INTRAMUSCULAR | Status: DC | PRN
  Administered 2013-04-26 – 2013-04-28 (×12): 2 mg via INTRAVENOUS
  Administered 2013-04-28 (×2): 1 mg via INTRAVENOUS
  Filled 2013-04-26 (×6): qty 2
  Filled 2013-04-26: qty 1
  Filled 2013-04-26: qty 2
  Filled 2013-04-26: qty 1
  Filled 2013-04-26 (×5): qty 2

## 2013-04-26 MED ORDER — DIPHENHYDRAMINE HCL 50 MG/ML IJ SOLN
25.0000 mg | Freq: Four times a day (QID) | INTRAMUSCULAR | Status: DC | PRN
  Administered 2013-04-26 – 2013-04-27 (×4): 25 mg via INTRAVENOUS
  Filled 2013-04-26 (×5): qty 1

## 2013-04-26 MED ORDER — IOHEXOL 300 MG/ML  SOLN
50.0000 mL | Freq: Once | INTRAMUSCULAR | Status: AC | PRN
  Administered 2013-04-26: 50 mL via ORAL

## 2013-04-26 MED ORDER — POTASSIUM CHLORIDE 10 MEQ/100ML IV SOLN
10.0000 meq | Freq: Once | INTRAVENOUS | Status: AC
  Administered 2013-04-26: 10 meq via INTRAVENOUS
  Filled 2013-04-26: qty 100

## 2013-04-26 NOTE — Progress Notes (Signed)
Subjective: Feels worse today. Abdomen more distended. Feels bloated, constipated. Also feel short of breath. No vomiting. No bowel movement. Passing Flatus  Objective: Vital signs in last 24 hours: Filed Vitals:   04/25/13 1535 04/25/13 2100 04/26/13 0500 04/26/13 0700  BP: 107/63 128/81 125/87   Pulse: 95 106 108   Temp: 98.2 F (36.8 C) 98.3 F (36.8 C) 97.9 F (36.6 C)   TempSrc: Oral Oral Oral   Resp: 20 18 20    Height:      Weight:      SpO2: 95% 98% 92% 93%   Weight change:   Intake/Output Summary (Last 24 hours) at 04/26/13 1116 Last data filed at 04/26/13 0900  Gross per 24 hour  Intake   5580 ml  Output      0 ml  Net   5580 ml   General: Uncomfortable appearing. Clutching his epigastric area Lungs clear to auscultation bilaterally without wheeze rhonchi. Rales present at the bases. Cardiovascular regular rate rhythm without murmurs gallops rubs Abdomen decreased bowel sounds. More distended today. Guarding present. Epigastrium tender. Extremities no clubbing cyanosis or edema  Lab Results: Basic Metabolic Panel:  Recent Labs Lab 04/25/13 0614 04/26/13 0530  NA 127* 130*  K 3.6 3.4*  CL 93* 96  CO2 25 26  GLUCOSE 93 107*  BUN 9 4*  CREATININE 0.85 0.71  CALCIUM 7.0* 7.5*   Liver Function Tests:  Recent Labs Lab 04/25/13 0614 04/26/13 0530  AST 108* 92*  ALT 45 35  ALKPHOS 89 117  BILITOT 1.0 1.2  PROT 6.2 6.7  ALBUMIN 2.6* 2.7*    Recent Labs Lab 04/25/13 0614 04/26/13 0530  LIPASE 80* 70*   No results found for this basename: AMMONIA,  in the last 168 hours CBC:  Recent Labs Lab 04/24/13 0805  WBC 6.7  NEUTROABS 3.7  HGB 13.8  HCT 38.3*  MCV 91.8  PLT 237   Cardiac Enzymes: No results found for this basename: CKTOTAL, CKMB, CKMBINDEX, TROPONINI,  in the last 168 hours BNP: No results found for this basename: PROBNP,  in the last 168 hours D-Dimer: No results found for this basename: DDIMER,  in the last 168  hours CBG: No results found for this basename: GLUCAP,  in the last 168 hours Hemoglobin A1C: No results found for this basename: HGBA1C,  in the last 168 hours Fasting Lipid Panel: No results found for this basename: CHOL, HDL, LDLCALC, TRIG, CHOLHDL, LDLDIRECT,  in the last 168 hours Thyroid Function Tests: No results found for this basename: TSH, T4TOTAL, FREET4, T3FREE, THYROIDAB,  in the last 168 hours Coagulation: No results found for this basename: LABPROT, INR,  in the last 168 hours Anemia Panel: No results found for this basename: VITAMINB12, FOLATE, FERRITIN, TIBC, IRON, RETICCTPCT,  in the last 168 hours Urine Drug Screen: Drugs of Abuse    Alcohol Level: No results found for this basename: ETH,  in the last 168 hours Urinalysis:  Recent Labs Lab 04/24/13 1013  COLORURINE YELLOW  LABSPEC >1.030*  PHURINE 5.5  GLUCOSEU NEGATIVE  HGBUR NEGATIVE  BILIRUBINUR NEGATIVE  KETONESUR NEGATIVE  PROTEINUR NEGATIVE  UROBILINOGEN 0.2  NITRITE NEGATIVE  LEUKOCYTESUR NEGATIVE   Micro Results:   Scheduled Meds: . bisacodyl  10 mg Rectal Daily  . citalopram  10 mg Oral Daily  . enoxaparin (LOVENOX) injection  40 mg Subcutaneous Q24H  . nicotine  14 mg Transdermal Daily  . pantoprazole (PROTONIX) IV  40 mg Intravenous Q24H  .  senna-docusate  2 tablet Oral Daily  . tamsulosin  0.4 mg Oral Daily  . thiamine  100 mg Intravenous Daily   Continuous Infusions: . 0.9 % NaCl with KCl 20 mEq / L 125 mL/hr at 04/26/13 0630   PRN Meds:.acetaminophen, acetaminophen, albuterol, alum & mag hydroxide-simeth, HYDROmorphone (DILAUDID) injection, LORazepam, promethazine, traZODone Assessment/Plan: Principal Problem:   Acute alcoholic pancreatitis Active Problems:   Hypertriglyceridemia   Essential hypertension, benign   Hyponatremia   GERD (gastroesophageal reflux disease)   BPH (benign prostatic hyperplasia)   Polysubstance abuse   Hypokalemia   Dyspnea  Seems worse today.  May be developing ileus. Make him n.p.o. I also feel short of breath. Will get an acute abdominal series. Check oxygen saturations and give supplemental oxygen. Will try Dulcolax suppository. Replete potassium. May require CT of the pancreas as well.   LOS: 2 days   [name] [name] L 04/26/2013, 11:16 AM

## 2013-04-26 NOTE — Clinical Social Work Psychosocial (Signed)
Clinical Social Work Department BRIEF PSYCHOSOCIAL ASSESSMENT 04/26/2013  Patient:  [name] [name],[name] [name]     Account Number:  [id number]     Admit date:  04/24/2013  Clinical Social Worker:  Nancie Neas  Date/Time:  04/26/2013 09:30 AM  Referred by:  Physician  Date Referred:  04/26/2013 Referred for  Substance Abuse   Other Referral:   Interview type:  Patient Other interview type:    PSYCHOSOCIAL DATA Living Status:  [place] Admitted from facility:   Level of care:   Primary support name:  Myrene Buddy Primary support relationship to patient:  SIBLING Degree of support available:   supportive per pt    CURRENT CONCERNS Current Concerns  Substance Abuse   Other Concerns:    SOCIAL WORK ASSESSMENT / PLAN CSW met with pt at bedside. Pt alert and oriented, but would only talk with eyes closed. He reports he was admitted due to alcoholic pancreatitis. Pt lives with his sister, Myrene Buddy who he describes as his best support. Pt has at least 7 year history of heavy drinking. He reports he increased his consumption in 05-15-2006 after a friend died and it has since become a habit. Pt denies any depression. He reports he is currently drinking about 1 pint of liquor daily. He admits to cocaine use on weekends. Pt is currently interested in stopping. He was recently at [facility] for rehab about 1 month ago and stayed for 12 days. Pt did not like it there, but would like to go somewhere else for rehab. He has also been to outpatient treatment which he did not find helpful. CSW discussed with MD who will order ACT team consult when pt is medically stable.   Assessment/plan status:  Referral to [facility] Other assessment/ plan:   Information/referral to community resources:   ACT team for inpatient treatment when medically stable    PATIENT'S/FAMILY'S RESPONSE TO PLAN OF CARE: Pt would like to go to inpatient treatment when ready to leave hospital. ACT team to be consulted. CSW signing off but can be  reconsulted if needed.       Derenda Fennel, [place] [phone]

## 2013-04-27 DIAGNOSIS — K859 Acute pancreatitis without necrosis or infection, unspecified: Secondary | ICD-10-CM

## 2013-04-27 LAB — CBC WITH DIFFERENTIAL/PLATELET
Basophils Absolute: 0 10*3/uL (ref 0.0–0.1)
Basophils Relative: 0 % (ref 0–1)
Eosinophils Absolute: 0.1 10*3/uL (ref 0.0–0.7)
Eosinophils Relative: 1 % (ref 0–5)
HCT: 35 % — ABNORMAL LOW (ref 39.0–52.0)
Hemoglobin: 12 g/dL — ABNORMAL LOW (ref 13.0–17.0)
Lymphocytes Relative: 9 % — ABNORMAL LOW (ref 12–46)
Lymphs Abs: 1.2 10*3/uL (ref 0.7–4.0)
MCH: 32.3 pg (ref 26.0–34.0)
MCHC: 34.3 g/dL (ref 30.0–36.0)
MCV: 94.1 fL (ref 78.0–100.0)
Monocytes Absolute: 0.8 10*3/uL (ref 0.1–1.0)
Monocytes Relative: 6 % (ref 3–12)
Neutro Abs: 11.2 10*3/uL — ABNORMAL HIGH (ref 1.7–7.7)
Neutrophils Relative %: 84 % — ABNORMAL HIGH (ref 43–77)
Platelets: 199 10*3/uL (ref 150–400)
RBC: 3.72 MIL/uL — ABNORMAL LOW (ref 4.22–5.81)
RDW: 15.6 % — ABNORMAL HIGH (ref 11.5–15.5)
WBC: 13.3 10*3/uL — ABNORMAL HIGH (ref 4.0–10.5)

## 2013-04-27 LAB — BASIC METABOLIC PANEL
BUN: <3 mg/dL — ABNORMAL LOW (ref 6–23)
CO2: 30 mEq/L (ref 19–32)
Calcium: 7.4 mg/dL — ABNORMAL LOW (ref 8.4–10.5)
Chloride: 96 mEq/L (ref 96–112)
Creatinine, Ser: 0.69 mg/dL (ref 0.50–1.35)
GFR calc Af Amer: >90 mL/min (ref >90)
GFR calc non Af Amer: >90 mL/min (ref >90)
Glucose, Bld: 101 mg/dL — ABNORMAL HIGH (ref 70–99)
Potassium: 3.3 mEq/L — ABNORMAL LOW (ref 3.5–5.1)
Sodium: 132 mEq/L — ABNORMAL LOW (ref 135–145)

## 2013-04-27 MED ORDER — GEMFIBROZIL 600 MG PO TABS
600.0000 mg | ORAL_TABLET | Freq: Two times a day (BID) | ORAL | Status: DC
  Administered 2013-04-27 – 2013-05-01 (×6): 600 mg via ORAL
  Filled 2013-04-27 (×8): qty 1

## 2013-04-27 MED ORDER — DIPHENHYDRAMINE HCL 50 MG/ML IJ SOLN
INTRAMUSCULAR | Status: AC
  Administered 2013-04-27: 25 mg via INTRAVENOUS
  Filled 2013-04-27: qty 1

## 2013-04-27 MED ORDER — POTASSIUM CHLORIDE 10 MEQ/100ML IV SOLN
10.0000 meq | INTRAVENOUS | Status: AC
  Administered 2013-04-27 (×2): 10 meq via INTRAVENOUS
  Filled 2013-04-27 (×2): qty 100

## 2013-04-27 MED ORDER — DIPHENHYDRAMINE HCL 50 MG/ML IJ SOLN
25.0000 mg | INTRAMUSCULAR | Status: DC | PRN
  Administered 2013-04-27 – 2013-05-01 (×18): 25 mg via INTRAVENOUS
  Filled 2013-04-27 (×18): qty 1

## 2013-04-27 MED ORDER — PANTOPRAZOLE SODIUM 40 MG PO TBEC
40.0000 mg | DELAYED_RELEASE_TABLET | Freq: Every day | ORAL | Status: DC
  Administered 2013-04-27 – 2013-05-01 (×5): 40 mg via ORAL
  Filled 2013-04-27 (×5): qty 1

## 2013-04-27 NOTE — Progress Notes (Signed)
Subjective: Had several stools.  Has been ambulating.  No vomiting. Hungry.  Still with a lot of pain. Interested in alcohol rehab.  No further dyspnea.  Objective: Vital signs in last 24 hours: Filed Vitals:   04/26/13 1527 04/26/13 2141 04/27/13 0210 04/27/13 0453  BP: 125/85 137/88 130/88 117/71  Pulse: 90 104 91 100  Temp: 98 F (36.7 C) 97.6 F (36.4 C) 97.5 F (36.4 C) 98.1 F (36.7 C)  TempSrc:  Oral Oral Oral  Resp: 18 20 20 20   Height:      Weight:      SpO2: 92% 91% 93% 92%   Weight change:   Intake/Output Summary (Last 24 hours) at 04/27/13 0927 Last data filed at 04/27/13 6213  Gross per 24 hour  Intake 1383.33 ml  Output      0 ml  Net 1383.33 ml   General: appears more comfortable. Walking around in room. Talkative. Lungs clear to auscultation bilaterally without wheeze rhonchi.  Cardiovascular regular rate rhythm without murmurs gallops rubs Abdomen decreased bowel sounds. Less distended.  Epigastrium tender. Extremities no clubbing cyanosis or edema  Lab Results: Basic Metabolic Panel:  Recent Labs Lab 04/26/13 0530 04/27/13 0600  NA 130* 132*  K 3.4* 3.3*  CL 96 96  CO2 26 30  GLUCOSE 107* 101*  BUN 4* <3*  CREATININE 0.71 0.69  CALCIUM 7.5* 7.4*   Liver Function Tests:  Recent Labs Lab 04/25/13 0614 04/26/13 0530  AST 108* 92*  ALT 45 35  ALKPHOS 89 117  BILITOT 1.0 1.2  PROT 6.2 6.7  ALBUMIN 2.6* 2.7*    Recent Labs Lab 04/25/13 0614 04/26/13 0530  LIPASE 80* 70*   No results found for this basename: AMMONIA,  in the last 168 hours CBC:  Recent Labs Lab 04/24/13 0805 04/27/13 0600  WBC 6.7 13.3*  NEUTROABS 3.7 11.2*  HGB 13.8 12.0*  HCT 38.3* 35.0*  MCV 91.8 94.1  PLT 237 199     Recent Labs Lab 04/24/13 1013  COLORURINE YELLOW  LABSPEC >1.030*  PHURINE 5.5  GLUCOSEU NEGATIVE  HGBUR NEGATIVE  BILIRUBINUR NEGATIVE  KETONESUR NEGATIVE  PROTEINUR NEGATIVE  UROBILINOGEN 0.2  NITRITE NEGATIVE   LEUKOCYTESUR NEGATIVE   Scheduled Meds: . bisacodyl  10 mg Rectal Daily  . citalopram  10 mg Oral Daily  . enoxaparin (LOVENOX) injection  40 mg Subcutaneous Q24H  . nicotine  14 mg Transdermal Daily  . pantoprazole (PROTONIX) IV  40 mg Intravenous Q24H  . senna-docusate  2 tablet Oral Daily  . tamsulosin  0.4 mg Oral Daily  . thiamine  100 mg Intravenous Daily   Continuous Infusions: . 0.9 % NaCl with KCl 20 mEq / L 125 mL/hr at 04/27/13 0909   PRN Meds:.acetaminophen, acetaminophen, albuterol, alum & mag hydroxide-simeth, diphenhydrAMINE, HYDROmorphone (DILAUDID) injection, LORazepam, promethazine, traZODone Assessment/Plan: Principal Problem:   Acute alcoholic pancreatitis Active Problems:   Hypertriglyceridemia   Essential hypertension, benign   Hyponatremia   GERD (gastroesophageal reflux disease)   BPH (benign prostatic hyperplasia)   Polysubstance abuse   Hypokalemia   Dyspnea  AAS yesterday showed ileus.  CT abd pelvis showed pancreatitis, poor definition of pancreatic body and tail which could be necrosis. Gallstones, cannot r/o gallstone pancreatits, but no definate obstruction/ductal dilitation.  Having bowel movements and feels hungry.  Clinically improved from yesterday.  Suspect etiology, as multiple times in the past, is alcohol related pancreatitis, but will consult GI to comment on whether further w/u is necessary for  gallstone pancreatitis.  Transaminitis without elevated alk phos could be related to EtOH.  Also has h/o hypertriglyceridemia (on Lopid).  Referral to surgery as outpatient for cholecystectomy.  Replet potassium.   Refer to alcohol treatment when medically appropriate.  Pt request.  Has been in treatment previously   LOS: 3 days   Chauntay Paszkiewicz L 04/27/2013, 9:27 AM

## 2013-04-27 NOTE — Consult Note (Addendum)
Referring Provider: No ref. provider found Primary Care Physician:  Milinda Antis, MD Primary Gastroenterologist:  DR. Jena Gauss  Reason for Consultation:  PANCREATITIS  HPI:  PT HAS BEEN ADMITTED 3 TIMES FOR PANCREATITIS. PMHX: ETOH ABUSE. ADMITTED TO REHAB BUT DID NOT COMPLETE RX DUE TO ABSCESS ON HIS BUTTOCKS. ONLY HS TROUBLE WITH PANCREATITIS WHEN HE DRINKS HEAVILY. HE DRINKS DURING SPORTING EVENTS. HE WAS WATCHING THE RACE MOST RECENTLY AND PRESENTED TO ED AFTER SUDDEN ONSET OF ABD PAIN, NAUSEA, VOMITING ON SAT. SINCE BEING ADMITTED HE CONTINUES WITH ABDOMINAL PAIN, BUT IT IS IMPROVED. HE HURTS MORE ON THE RIGHT THAN THE LEFT. Marland Kitchen HIS VOMITING IS RESOLVED AND HIS NAUSEA IS OFF AND ON. HIS ABD FEELS TIGHT. LAST BM x2 TODAY.   Past Medical History  Diagnosis Date  . Bronchitis   . Acid reflux   . Pancreatitis     March 2013  . Gout   . Bipolar disorder   . Hyperlipidemia   . Alcohol abuse     6-8 cans of beer daily  . Pseudocyst of pancreas 02/28/2012  . Arthritis   . Fracture of lower leg   . Hypertension   . Chronic pain   . Chronic neck pain   . Chronic back pain   . Left arm pain     chronic   Past Surgical History  Procedure Laterality Date  . Nose surgery      broken nose  . Circumcision  03/17/2012    Procedure: CIRCUMCISION ADULT;  Surgeon: Ky Barban, MD;  Location: [facility];  Service: Urology;  Laterality: N/A;  . Colonoscopy with propofol  12/24/2012    Procedure: COLONOSCOPY WITH PROPOFOL;  Surgeon: Corbin Ade, MD;  Location: [facility];  Service: Endoscopy;  Laterality: N/A;  started at 0803, in cecum at 0812, withdrawel time Biopsy of anal lesion  . Esophagogastroduodenoscopy (egd) with propofol  12/24/2012    Procedure: ESOPHAGOGASTRODUODENOSCOPY (EGD) WITH PROPOFOL;  Surgeon: Corbin Ade, MD;  Location: [facility];  Service: Endoscopy;  Laterality: N/A;  done at 0800    Prior to Admission medications   Medication Sig Start Date End Date Taking?  Authorizing Provider  albuterol (PROVENTIL HFA;VENTOLIN HFA) 108 (90 BASE) MCG/ACT inhaler Inhale 2 puffs into the lungs every 4 (four) hours as needed for wheezing. For asthma/wheezing 03/19/13  Yes Sanjuana Kava, NP  amLODipine (NORVASC) 5 MG tablet Take 1 tablet (5 mg total) by mouth daily. For high blood pressure control 03/19/13  Yes Sanjuana Kava, NP  citalopram (CELEXA) 10 MG tablet Take 10 mg by mouth daily.   Yes Historical Provider, MD  fish oil-omega-3 fatty acids 1000 MG capsule Take 1 capsule (1 g total) by mouth daily. For cholesterol control 03/19/13  Yes Sanjuana Kava, NP  gemfibrozil (LOPID) 600 MG tablet Take 1 tablet (600 mg total) by mouth 2 (two) times daily before a meal. Cholesterol control 03/19/13  Yes Sanjuana Kava, NP  ibuprofen (ADVIL,MOTRIN) 200 MG tablet Take 600 mg by mouth every 6 (six) hours as needed for pain.   Yes Historical Provider, MD  methocarbamol (ROBAXIN) 500 MG tablet Take 1 tablet (500 mg total) by mouth 3 (three) times daily at 8am, 2pm and bedtime. For muscle spasms 03/19/13  Yes Sanjuana Kava, NP  omeprazole (PRILOSEC) 20 MG capsule Take 1 capsule (20 mg total) by mouth daily. For acid reflux 03/19/13  Yes Sanjuana Kava, NP  prochlorperazine (COMPAZINE) 10 MG tablet Take  1 tablet by mouth daily as needed (nausea).  04/02/13  Yes Historical Provider, MD  tamsulosin (FLOMAX) 0.4 MG CAPS Take 1 capsule (0.4 mg total) by mouth daily. For enlarged prostate 03/19/13  Yes Sanjuana Kava, NP  traZODone (DESYREL) 100 MG tablet Take 1 tablet by mouth at bedtime as needed for sleep.  01/21/13  Yes Historical Provider, MD    Current Facility-Administered Medications  Medication Dose Route Frequency Provider Last Rate Last Dose  . 0.9 % NaCl with KCl 20 mEq/ L  infusion   Intravenous Continuous Christiane Ha, MD 125 mL/hr at 04/27/13 0909    . acetaminophen (TYLENOL) tablet 650 mg  650 mg Oral Q6H PRN Christiane Ha, MD   650 mg at 04/25/13 1632   Or  .  acetaminophen (TYLENOL) suppository 650 mg  650 mg Rectal Q6H PRN Christiane Ha, MD      . albuterol (PROVENTIL HFA;VENTOLIN HFA) 108 (90 BASE) MCG/ACT inhaler 2 puff  2 puff Inhalation Q4H PRN Christiane Ha, MD      . alum & mag hydroxide-simeth (MAALOX/MYLANTA) 200-200-20 MG/5ML suspension 30 mL  30 mL Oral Q6H PRN Christiane Ha, MD      . bisacodyl (DULCOLAX) suppository 10 mg  10 mg Rectal Daily Christiane Ha, MD   10 mg at 04/26/13 1013  . citalopram (CELEXA) tablet 10 mg  10 mg Oral Daily Christiane Ha, MD   10 mg at 04/27/13 0907  . diphenhydrAMINE (BENADRYL) injection 25 mg  25 mg Intravenous Q6H PRN Christiane Ha, MD   25 mg at 04/27/13 0601  . enoxaparin (LOVENOX) injection 40 mg  40 mg Subcutaneous Q24H Christiane Ha, MD   40 mg at 04/26/13 1826  . gemfibrozil (LOPID) tablet 600 mg  600 mg Oral BID AC Christiane Ha, MD      . HYDROmorphone (DILAUDID) injection 1-2 mg  1-2 mg Intravenous Q3H PRN Christiane Ha, MD   2 mg at 04/27/13 0907  . LORazepam (ATIVAN) injection 1 mg  1 mg Intravenous Q4H PRN Christiane Ha, MD      . nicotine (NICODERM CQ - dosed in mg/24 hours) patch 14 mg  14 mg Transdermal Daily Christiane Ha, MD   14 mg at 04/27/13 0907  . pantoprazole (PROTONIX) injection 40 mg  40 mg Intravenous Q24H Christiane Ha, MD   40 mg at 04/26/13 1634  . potassium chloride 10 mEq in 100 mL IVPB  10 mEq Intravenous Q1 Hr x 2 Christiane Ha, MD   10 mEq at 04/27/13 1027  . promethazine (PHENERGAN) injection 12.5 mg  12.5 mg Intravenous Q4H PRN Christiane Ha, MD      . senna-docusate (Senokot-S) tablet 2 tablet  2 tablet Oral Daily Christiane Ha, MD   2 tablet at 04/27/13 0906  . tamsulosin (FLOMAX) capsule 0.4 mg  0.4 mg Oral Daily Christiane Ha, MD   0.4 mg at 04/27/13 0906  . thiamine (B-1) injection 100 mg  100 mg Intravenous Daily Christiane Ha, MD   100 mg at 04/27/13 0907  . traZODone (DESYREL)  tablet 50 mg  50 mg Oral QHS PRN Christiane Ha, MD   50 mg at 04/26/13 2344    Allergies as of 04/24/2013 - Review Complete 04/24/2013  Allergen Reaction Noted  . Penicillins Itching 08/05/2011    Family History:  Colon Cancer  negative  Polyps  negative   History   Social History  . Marital Status: Single    Spouse Name: N/A    Number of Children: N/A  . Years of Education: N/A   Occupational History  . unemployed    Social History Main Topics  . Smoking status: Current Every Day Smoker -- 0.50 packs/day for 25 years    Types: Cigarettes  . Smokeless tobacco: Not on file  . Alcohol Use: 0.0 oz/week     Comment: 6-8 beers a day  . Drug Use: Yes    Special: Cocaine, Marijuana     Comment: hx of marijuana and cocaine use last used cocaine 2 weeks ago  . Sexually Active: Not Currently   Other Topics Concern  . Not on file   Social History Narrative  . No narrative on file    Review of Systems: PER HPI OTHERWISE ALL SYSTEMS ARE NEGATIVE. PT DENIES FEVER, CHILLS, BRBPR, melena, diarrhea, constipation, problems swallowing, Heartburn or indigestion.   Vitals: Blood pressure 117/71, pulse 100, temperature 98.1 F (36.7 C), temperature source Oral, resp. rate 20, height 5\' 10"  (1.778 m), weight 171 lb 11.8 oz (77.9 kg), SpO2 90.00%.  Physical Exam: General:   Alert,  Well-developed, well-nourished, pleasant and cooperative in MILD DISTRESS WITH CHANGING POSITIONS Head:  Normocephalic and atraumatic. Eyes:  Sclera clear, no icterus.   Conjunctiva pink. Mouth:  No deformity or lesions, dentition ABnormal. Neck:  Supple; no masses  Lungs:  Clear throughout to auscultation.   No wheezes. No acute distress. Heart:  Regular rate and rhythm; no murmurs. Abdomen:  Soft, distended. MODERATE TTP x4 WITH MILD GUARDING AND REBOUND, NL bowel sounds Msk:  Symmetrical without gross deformities. Normal posture. Extremities:  With TRACE edema  BIL. Neurologic:  Alert and  oriented x4;  grossly normal neurologically. Cervical Nodes:  No significant cervical adenopathy. Psych:  Alert and cooperative. Normal mood and affect.  Lab Results:  Recent Labs  04/27/13 0600  WBC 13.3*  HGB 12.0*  HCT 35.0*  PLT 199   BMET  Recent Labs  04/26/13 0530 04/27/13 0600  NA 130* 132*  K 3.4* 3.3*  CL 96 96  CO2 26 30  GLUCOSE 107* 101*  BUN 4* <3*  CREATININE 0.71 0.69  CALCIUM 7.5* 7.4*   LFT  Recent Labs  04/26/13 0530  PROT 6.7  ALBUMIN 2.7*  AST 92*  ALT 35  ALKPHOS 117  BILITOT 1.2     Studies/Results: CT ABD/PELVIS MAY 5 ACUTE PANCREATITIS/GALLSTONES  Impression: ACUTE INTERMITTENT PANCREATITIS MOST LIKELY DUE TO ETOH, LESS LIKELY GALLSTONES.  Plan: 1. EXPLAINED TO PT LONG TERM IMPLICATION FOR CONTINUED ETOH USE TO INCLUDE DIABETES AND CHRONIC PAIN/PNACREATITIS. 2. QD PPI 3. FULL LIQUIDS. AGGRESSIVE HYDRATION. 4. MONITOR BMs. MIRALAX PRN 5. NO INDICATION FOR CHOLECYSTECTOMY AT THIS TIME. IF OPT AVOID ETOH AND CONTINUES TO HAVE SX TYPICAL FOR BILIARY COLIC THEN WOULD CONSIDER SURGERY CONSULT. 6. AGREE WITH REFERRAL TO REHAB. PT PREFERS [facility]. DISCUSSED WITH CASE MANAGER.   LOS: 3 days   Kriste Broman  04/27/2013, 10:54 AM

## 2013-04-28 ENCOUNTER — Encounter: Payer: Self-pay | Admitting: Family Medicine

## 2013-04-28 DIAGNOSIS — F1010 Alcohol abuse, uncomplicated: Secondary | ICD-10-CM

## 2013-04-28 DIAGNOSIS — K859 Acute pancreatitis without necrosis or infection, unspecified: Secondary | ICD-10-CM

## 2013-04-28 LAB — CBC WITH DIFFERENTIAL/PLATELET
Basophils Absolute: 0 10*3/uL (ref 0.0–0.1)
Basophils Relative: 0 % (ref 0–1)
Eosinophils Absolute: 0 10*3/uL (ref 0.0–0.7)
Eosinophils Relative: 0 % (ref 0–5)
HCT: 33.5 % — ABNORMAL LOW (ref 39.0–52.0)
Hemoglobin: 11.4 g/dL — ABNORMAL LOW (ref 13.0–17.0)
Lymphocytes Relative: 11 % — ABNORMAL LOW (ref 12–46)
Lymphs Abs: 1.1 10*3/uL (ref 0.7–4.0)
MCH: 32.4 pg (ref 26.0–34.0)
MCHC: 34 g/dL (ref 30.0–36.0)
MCV: 95.2 fL (ref 78.0–100.0)
Monocytes Absolute: 1 10*3/uL (ref 0.1–1.0)
Monocytes Relative: 10 % (ref 3–12)
Neutro Abs: 8 10*3/uL — ABNORMAL HIGH (ref 1.7–7.7)
Neutrophils Relative %: 79 % — ABNORMAL HIGH (ref 43–77)
Platelets: 245 10*3/uL (ref 150–400)
RBC: 3.52 MIL/uL — ABNORMAL LOW (ref 4.22–5.81)
RDW: 16.2 % — ABNORMAL HIGH (ref 11.5–15.5)
WBC: 10.1 10*3/uL (ref 4.0–10.5)

## 2013-04-28 LAB — COMPREHENSIVE METABOLIC PANEL
ALT: 22 U/L (ref 0–53)
AST: 40 U/L — ABNORMAL HIGH (ref 0–37)
Albumin: 2.6 g/dL — ABNORMAL LOW (ref 3.5–5.2)
Alkaline Phosphatase: 98 U/L (ref 39–117)
BUN: <3 mg/dL — ABNORMAL LOW (ref 6–23)
CO2: 29 mEq/L (ref 19–32)
Calcium: 7.8 mg/dL — ABNORMAL LOW (ref 8.4–10.5)
Chloride: 101 mEq/L (ref 96–112)
Creatinine, Ser: 0.67 mg/dL (ref 0.50–1.35)
GFR calc Af Amer: >90 mL/min (ref >90)
GFR calc non Af Amer: >90 mL/min (ref >90)
Glucose, Bld: 91 mg/dL (ref 70–99)
Potassium: 3.6 mEq/L (ref 3.5–5.1)
Sodium: 137 mEq/L (ref 135–145)
Total Bilirubin: 0.8 mg/dL (ref 0.3–1.2)
Total Protein: 6.2 g/dL (ref 6.0–8.3)

## 2013-04-28 LAB — LIPASE, BLOOD: Lipase: 35 U/L (ref 11–59)

## 2013-04-28 MED ORDER — VITAMIN B-1 100 MG PO TABS
100.0000 mg | ORAL_TABLET | Freq: Every day | ORAL | Status: DC
  Administered 2013-04-28 – 2013-05-01 (×4): 100 mg via ORAL
  Filled 2013-04-28 (×4): qty 1

## 2013-04-28 MED ORDER — MORPHINE SULFATE 4 MG/ML IJ SOLN
4.0000 mg | INTRAMUSCULAR | Status: DC | PRN
  Administered 2013-04-28 – 2013-04-29 (×4): 4 mg via INTRAVENOUS
  Filled 2013-04-28 (×4): qty 1

## 2013-04-28 MED ORDER — OXYCODONE HCL 5 MG PO TABS
10.0000 mg | ORAL_TABLET | ORAL | Status: DC | PRN
  Administered 2013-04-28 – 2013-04-29 (×6): 10 mg via ORAL
  Filled 2013-04-28 (×6): qty 2

## 2013-04-28 NOTE — Progress Notes (Signed)
Subjective: Ate oatmeal for breakfast. Continued nausea, +epigastric pain. Lipase normalized today. Large loose stool overnight but no further diarrhea. Wants to quit drinking.   Objective: Vital signs in last 24 hours: Temp:  [97.3 F (36.3 C)-98 F (36.7 C)] 97.3 F (36.3 C) (05/07 0606) Pulse Rate:  [83-100] 83 (05/07 0606) Resp:  [20] 20 (05/07 0606) BP: (129-146)/(85-91) 146/85 mmHg (05/07 0606) SpO2:  [90 %-93 %] 93 % (05/07 0606) Last BM Date: 03/29/13 General:   Alert and oriented, pleasant Head:  Normocephalic and atraumatic. Eyes:  No icterus, sclera clear. Conjuctiva pink.  Heart:  S1, S2 present, no murmurs noted.  Lungs: Clear to auscultation bilaterally, without wheezing, rales, or rhonchi.  Abdomen:  Bowel sounds present, soft, TTP epigastric region, non-distended. No HSM or hernias noted. No rebound or guarding. No masses appreciated  Msk:  Symmetrical without gross deformities. Normal posture. Extremities:  Without clubbing or edema. Neurologic:  Alert and  oriented x4;  grossly normal neurologically. Skin:  Warm and dry, intact without significant lesions.  Psych:  Alert and cooperative. Normal mood and affect.  Intake/Output from previous day: 05/06 0701 - 05/07 0700 In: 3630 [P.O.:780; I.V.:2850] Out: -  Intake/Output this shift:    Lab Results:  Recent Labs  04/27/13 0600  WBC 13.3*  HGB 12.0*  HCT 35.0*  PLT 199   BMET  Recent Labs  04/26/13 0530 04/27/13 0600 04/28/13 0601  NA 130* 132* 137  K 3.4* 3.3* 3.6  CL 96 96 101  CO2 26 30 29   GLUCOSE 107* 101* 91  BUN 4* <3* <3*  CREATININE 0.71 0.69 0.67  CALCIUM 7.5* 7.4* 7.8*   LFT  Recent Labs  04/26/13 0530 04/28/13 0601  PROT 6.7 6.2  ALBUMIN 2.7* 2.6*  AST 92* 40*  ALT 35 22  ALKPHOS 117 98  BILITOT 1.2 0.8     Studies/Results: Ct Abdomen Pelvis W Contrast  04/26/2013  *RADIOLOGY REPORT*  Clinical Data: Recurrent pancreatitis  CT ABDOMEN AND PELVIS WITH CONTRAST   Technique:  Multidetector CT imaging of the abdomen and pelvis was performed following the standard protocol during bolus administration of intravenous contrast.  Contrast: 50mL OMNIPAQUE IOHEXOL 300 MG/ML  SOLN, OMNIPAQUE IOHEXOL 300 MG/ML  SOLN  Comparison: CT 11/17/2012  Findings: Mild bilateral effusions and mild basilar atelectasis. No pericardial fluid.  No focal hepatic lesion.  The pancreas head and proximal body are normal.  There is significant inflammation involving the pancreatic distal body and tail.  There are no organized fluid collections but there is significant the peripancreatic fat inflammation and fluid along the left anterior pararenal space.  There is poor definition of the pancreatic parenchyma in the distal body and tail which could indicate necrosis.  There are several small gallstones in the gallbladder.  The pancreatic duct and common bile duct are prominent but within normal limits.  There is no evidence of vascular complication.  Again no organized fluid collections.  The spleen is normal.  The splenic vein appears patent.  Portal veins are patent.  The adrenal glands are normal.  The kidneys are normal.  The stomach, duodenum, small bowel, and colon unremarkable.  Abdominal aorta normal caliber.  No retroperitoneal periportal lymphadenopathy.  No free fluid the pelvis.  Prostate gland bladder normal.  No pelvic lymphadenopathy.  Fat filled inguinal hernias are noted.  No aggressive osseous lesions.  IMPRESSION:  1.  Acute pancreatitis involving the pancreatic body and tail. There is poor definition of the pancreatic  parenchyma in this region which could indicate necrosis. 2.  No organized fluid collection.  Significant peripancreatic fat inflammation and fluid in the left upper quadrant. 3.  No evidence of vascular complication. 4.  Bilateral pleural effusions and mild basilar atelectasis.  5.  Several small gallstones within the gallbladder.  Cannot exclude  gallstone  pancreatitis.   Original Report Authenticated By: Genevive Bi, M.D.    Dg Abd Acute W/chest  04/26/2013  *RADIOLOGY REPORT*  Clinical Data: Worsening pancreatitis  and sob.  ACUTE ABDOMEN SERIES (ABDOMEN 2 VIEW & CHEST 1 VIEW)  Comparison: 04/24/2013  Findings: Stable band-like opacities noted at the lung bases, with low lung volumes.  No cardiomegaly observed.  Abnormal dilated loops of bowel noted with air-fluid levels in the mid abdomen.  Mild prominence of gas noted at the splenic flexure. Large central collection of gas in the abdomen seems to low in position for gastric gas on the upright view, and may represent a distended transverse colon or a markedly distended loop of small bowel.  IMPRESSION:  1.  Scattered abnormal air-fluid levels in mildly dilated small bowel, compatible with ileus. 2.  Abnormal gas filled structure in the central abdomen probably does not represent gas in the stomach, and accordingly probably represents a dilated segment of colon or small bowel.  CT scan of the abdomen with contrast should be considered for further characterization. 3.  Bibasilar atelectasis.   Original Report Authenticated By: Gaylyn Rong, M.D.    Lab Results  Component Value Date   LIPASE 35 04/28/2013     Assessment: 52 year old male with recurrent ETOH pancreatitis, noted to have gallstones on CT. Tolerated diet but continued nausea, epigastric pain. Lipase normalized. No need for further work-up of biliary component; however, it patient abstains from ETOH and has biliary symptoms as outpatient, consider surgery consult.   Plan: Continue PPI daily ETOH cessation Hopeful d/c in next 24+ hours if tolerating diet and good pain control Recheck CBC now  Nira Retort, ANP-BC [facility]     LOS: 4 days    04/28/2013, 8:21 AM

## 2013-04-28 NOTE — Progress Notes (Signed)
Subjective: This man was admitted with alcoholic pancreatitis. He says today that his abdomen is painful but he has been tolerating a diet without vomiting. He has had some issues with constipation. He remains on IV fluids.           Physical Exam: Blood pressure 146/85, pulse 83, temperature 97.3 F (36.3 C), temperature source Oral, resp. rate 20, height 5\' 10"  (1.778 m), weight 77.9 kg (171 lb 11.8 oz), SpO2 93.00%. He looks systemically well. Is not toxic or septic. Abdomen is somewhat distended and slightly tense. Bowel sounds are heard and he is not extremely tender. He is alert and orientated. Lung fields are clear.   Investigations:  No results found for this or any previous visit (from the past 240 hour(s)).   Basic Metabolic Panel:  Recent Labs  21/30/86 0600 04/28/13 0601  NA 132* 137  K 3.3* 3.6  CL 96 101  CO2 30 29  GLUCOSE 101* 91  BUN <3* <3*  CREATININE 0.69 0.67  CALCIUM 7.4* 7.8*   Liver Function Tests:  Recent Labs  04/26/13 0530 04/28/13 0601  AST 92* 40*  ALT 35 22  ALKPHOS 117 98  BILITOT 1.2 0.8  PROT 6.7 6.2  ALBUMIN 2.7* 2.6*     CBC:  Recent Labs  04/27/13 0600  WBC 13.3*  NEUTROABS 11.2*  HGB 12.0*  HCT 35.0*  MCV 94.1  PLT 199    Ct Abdomen Pelvis W Contrast  04/26/2013  *RADIOLOGY REPORT*  Clinical Data: Recurrent pancreatitis  CT ABDOMEN AND PELVIS WITH CONTRAST  Technique:  Multidetector CT imaging of the abdomen and pelvis was performed following the standard protocol during bolus administration of intravenous contrast.  Contrast: 50mL OMNIPAQUE IOHEXOL 300 MG/ML  SOLN, OMNIPAQUE IOHEXOL 300 MG/ML  SOLN  Comparison: CT 11/17/2012  Findings: Mild bilateral effusions and mild basilar atelectasis. No pericardial fluid.  No focal hepatic lesion.  The pancreas head and proximal body are normal.  There is significant inflammation involving the pancreatic distal body and tail.  There are no organized fluid  collections but there is significant the peripancreatic fat inflammation and fluid along the left anterior pararenal space.  There is poor definition of the pancreatic parenchyma in the distal body and tail which could indicate necrosis.  There are several small gallstones in the gallbladder.  The pancreatic duct and common bile duct are prominent but within normal limits.  There is no evidence of vascular complication.  Again no organized fluid collections.  The spleen is normal.  The splenic vein appears patent.  Portal veins are patent.  The adrenal glands are normal.  The kidneys are normal.  The stomach, duodenum, small bowel, and colon unremarkable.  Abdominal aorta normal caliber.  No retroperitoneal periportal lymphadenopathy.  No free fluid the pelvis.  Prostate gland bladder normal.  No pelvic lymphadenopathy.  Fat filled inguinal hernias are noted.  No aggressive osseous lesions.  IMPRESSION:  1.  Acute pancreatitis involving the pancreatic body and tail. There is poor definition of the pancreatic parenchyma in this region which could indicate necrosis. 2.  No organized fluid collection.  Significant peripancreatic fat inflammation and fluid in the left upper quadrant. 3.  No evidence of vascular complication. 4.  Bilateral pleural effusions and mild basilar atelectasis.  5.  Several small gallstones within the gallbladder.  Cannot exclude  gallstone pancreatitis.   Original Report Authenticated By: Genevive Bi, M.D.    Dg Abd Acute W/chest  04/26/2013  *  RADIOLOGY REPORT*  Clinical Data: Worsening pancreatitis  and sob.  ACUTE ABDOMEN SERIES (ABDOMEN 2 VIEW & CHEST 1 VIEW)  Comparison: 04/24/2013  Findings: Stable band-like opacities noted at the lung bases, with low lung volumes.  No cardiomegaly observed.  Abnormal dilated loops of bowel noted with air-fluid levels in the mid abdomen.  Mild prominence of gas noted at the splenic flexure. Large central collection of gas in the abdomen seems to low  in position for gastric gas on the upright view, and may represent a distended transverse colon or a markedly distended loop of small bowel.  IMPRESSION:  1.  Scattered abnormal air-fluid levels in mildly dilated small bowel, compatible with ileus. 2.  Abnormal gas filled structure in the central abdomen probably does not represent gas in the stomach, and accordingly probably represents a dilated segment of colon or small bowel.  CT scan of the abdomen with contrast should be considered for further characterization. 3.  Bibasilar atelectasis.   Original Report Authenticated By: Gaylyn Rong, M.D.       Medications: I have reviewed the patient's current medications.  Impression: 1. Acute alcoholic pancreatitis, slowly improving. 2. Polysubstance drug abuse, current urine drug screen was positive for cocaine. 3. Hypertension. 4. Alcohol abuse. 5. Presence of gallstones, doubt that this is playing a role at this point.     Plan: 1. Advance diet. 2. Mobilize. 3. Not quite ready for discharge at this point.     LOS: 4 days   Wilson Singer Pager [phone]  04/28/2013, 9:06 AM

## 2013-04-28 NOTE — Progress Notes (Signed)
Epigastric pain worsened this afternoon after eating a large lunch. I feel we need to back off on his diet to clear liquids for now and reassess tomorrow morning.

## 2013-04-28 NOTE — Clinical Social Work Note (Addendum)
Pt would like to pursue inpatient treatment at [facility]. CSW instructed by [facility] to call on day of d/c at 0915 to see if beds are available. Per MD, pt not stable for d/c today. CSW faxed letter written by MD to pt's attorney as pt has court date tomorrow.   Derenda Fennel, [place] [phone]

## 2013-04-28 NOTE — Progress Notes (Signed)
[facility] SURGICAL UNIT [id number] [place] [address] Phone: [phone] Fax: [phone]  Apr 28, 2013  Patient: [name] [name]  Date of Birth: [birth year]  Date of Visit: 04/24/2013    To Whom It May Concern:  Kydan Shanholtzer was seen and treated in our hospital on 04/24/2013 and still remains in the hospital today.Franne Grip  will be unable to attend court tomorrow 04/29/2013 due to illness.  Sincerely,

## 2013-04-28 NOTE — Progress Notes (Signed)
Utilization Review Complete  

## 2013-04-28 NOTE — Care Management Note (Signed)
    Page 1 of 1   04/30/2013     2:02:44 PM   CARE MANAGEMENT NOTE 04/30/2013  Patient:  [name] [name],[name] [name]   Account Number:  [id number]  Date Initiated:  04/28/2013  Documentation initiated by:  Rosemary Holms  Subjective/Objective Assessment:   Pt admitted from home where he reports to live with his sister. CSW following to place pt in a substance abuse treatment facility when medically stable.     Action/Plan:   Anticipated DC Date:  05/01/2013   Anticipated DC Plan:    In-house referral  Clinical Social Worker      DC Planning Services  CM consult      Choice offered to / List presented to:             Status of service:  Completed, signed off Medicare Important Message given?  YES (If response is "NO", the following Medicare IM given date fields will be blank) Date Medicare IM given:  04/30/2013 Date Additional Medicare IM given:    Discharge Disposition:    Per UR Regulation:    If discussed at Long Length of Stay Meetings, dates discussed:   04/29/2013    Comments:  04/30/13 Rosemary Holms RN BSN CM Pt wishes to DC to [facility] or [facility] for ETOH rehab  04/28/13 Rosemary Holms RN BNS [facility]

## 2013-04-29 ENCOUNTER — Inpatient Hospital Stay (HOSPITAL_COMMUNITY): Payer: Medicare Other

## 2013-04-29 DIAGNOSIS — K859 Acute pancreatitis without necrosis or infection, unspecified: Secondary | ICD-10-CM

## 2013-04-29 DIAGNOSIS — F1010 Alcohol abuse, uncomplicated: Secondary | ICD-10-CM

## 2013-04-29 LAB — CBC
HCT: 34.9 % — ABNORMAL LOW (ref 39.0–52.0)
Hemoglobin: 12.1 g/dL — ABNORMAL LOW (ref 13.0–17.0)
MCH: 32.4 pg (ref 26.0–34.0)
MCHC: 34.7 g/dL (ref 30.0–36.0)
MCV: 93.6 fL (ref 78.0–100.0)
Platelets: 299 10*3/uL (ref 150–400)
RBC: 3.73 MIL/uL — ABNORMAL LOW (ref 4.22–5.81)
RDW: 16 % — ABNORMAL HIGH (ref 11.5–15.5)
WBC: 9 10*3/uL (ref 4.0–10.5)

## 2013-04-29 LAB — COMPREHENSIVE METABOLIC PANEL
ALT: 17 U/L (ref 0–53)
AST: 25 U/L (ref 0–37)
Albumin: 2.6 g/dL — ABNORMAL LOW (ref 3.5–5.2)
Alkaline Phosphatase: 92 U/L (ref 39–117)
BUN: <3 mg/dL — ABNORMAL LOW (ref 6–23)
CO2: 23 mEq/L (ref 19–32)
Calcium: 8.4 mg/dL (ref 8.4–10.5)
Chloride: 100 mEq/L (ref 96–112)
Creatinine, Ser: 0.66 mg/dL (ref 0.50–1.35)
GFR calc Af Amer: >90 mL/min (ref >90)
GFR calc non Af Amer: >90 mL/min (ref >90)
Glucose, Bld: 99 mg/dL (ref 70–99)
Potassium: 3.2 mEq/L — ABNORMAL LOW (ref 3.5–5.1)
Sodium: 135 mEq/L (ref 135–145)
Total Bilirubin: 0.7 mg/dL (ref 0.3–1.2)
Total Protein: 6.4 g/dL (ref 6.0–8.3)

## 2013-04-29 MED ORDER — ALUM & MAG HYDROXIDE-SIMETH 200-200-20 MG/5ML PO SUSP
30.0000 mL | Freq: Four times a day (QID) | ORAL | Status: DC | PRN
  Administered 2013-04-29: 30 mL via ORAL
  Filled 2013-04-29: qty 30

## 2013-04-29 MED ORDER — IOHEXOL 300 MG/ML  SOLN
50.0000 mL | INTRAMUSCULAR | Status: AC
  Administered 2013-04-29: 50 mL via ORAL

## 2013-04-29 MED ORDER — POTASSIUM CHLORIDE IN NACL 40-0.9 MEQ/L-% IV SOLN
INTRAVENOUS | Status: DC
  Administered 2013-04-29 (×2): via INTRAVENOUS

## 2013-04-29 MED ORDER — IOHEXOL 300 MG/ML  SOLN
100.0000 mL | Freq: Once | INTRAMUSCULAR | Status: AC | PRN
  Administered 2013-04-29: 100 mL via INTRAVENOUS

## 2013-04-29 MED ORDER — HYDROMORPHONE HCL PF 1 MG/ML IJ SOLN
1.0000 mg | INTRAMUSCULAR | Status: DC | PRN
  Administered 2013-04-29 – 2013-04-30 (×8): 1 mg via INTRAVENOUS
  Filled 2013-04-29 (×8): qty 1

## 2013-04-29 MED ORDER — PANCRELIPASE (LIP-PROT-AMYL) 12000-38000 UNITS PO CPEP
4.0000 | ORAL_CAPSULE | Freq: Three times a day (TID) | ORAL | Status: DC
  Administered 2013-04-29 – 2013-04-30 (×3): 4 via ORAL
  Administered 2013-05-01: 1 via ORAL
  Filled 2013-04-29 (×5): qty 4

## 2013-04-29 NOTE — Progress Notes (Signed)
Subjective:  Patient sitting in chair eating soup and crackers. Bent over, rocking back and forth, moaning. C/o increased abdominal pain. States he plans on finishing his soup. No vomiting. +flatus. BM since drinking CT contrast.   Objective: Vital signs in last 24 hours: Temp:  [97.9 F (36.6 C)-98.5 F (36.9 C)] 98.1 F (36.7 C) (05/08 1416) Pulse Rate:  [88-99] 99 (05/08 1416) Resp:  [20] 20 (05/08 1416) BP: (128-149)/(85-92) 130/86 mmHg (05/08 1416) SpO2:  [91 %-96 %] 93 % (05/08 1416) Last BM Date: 04/28/13 General:   Alert,  Well-developed, well-nourished, pleasant and cooperative in NAD Head:  Normocephalic and atraumatic. Eyes:  Sclera clear, no icterus.   Abdomen:  Soft, nondistended. Moderate epigastric/right sided tenderness. Tympanic bowel sounds, without guarding, and without rebound.   Extremities:  Without clubbing, deformity or edema. Neurologic:  Alert and  oriented x4;  grossly normal neurologically. Skin:  Intact without significant lesions or rashes. Psych:  Alert and cooperative. Normal mood and affect.  Intake/Output from previous day: 05/07 0701 - 05/08 0700 In: 960 [P.O.:960] Out: -  Intake/Output this shift:    Lab Results: CBC  Recent Labs  04/27/13 0600 04/28/13 0601 04/29/13 0536  WBC 13.3* 10.1 9.0  HGB 12.0* 11.4* 12.1*  HCT 35.0* 33.5* 34.9*  MCV 94.1 95.2 93.6  PLT 199 245 299   BMET  Recent Labs  04/27/13 0600 04/28/13 0601 04/29/13 0536  NA 132* 137 135  K 3.3* 3.6 3.2*  CL 96 101 100  CO2 30 29 23   GLUCOSE 101* 91 99  BUN <3* <3* <3*  CREATININE 0.69 0.67 0.66  CALCIUM 7.4* 7.8* 8.4   LFTs  Recent Labs  04/28/13 0601 04/29/13 0536  BILITOT 0.8 0.7  ALKPHOS 98 92  AST 40* 25  ALT 22 17  PROT 6.2 6.4  ALBUMIN 2.6* 2.6*    Recent Labs  04/28/13 0601  LIPASE 35   PT/INR No results found for this basename: LABPROT, INR,  in the last 72 hours    Imaging Studies: Ct Head Wo Contrast  04/04/2013  *RADIOLOGY  REPORT*  Clinical Data: Headache.  Hypotension.  Acute mental status changes.  CT HEAD WITHOUT CONTRAST  Technique:  Contiguous axial images were obtained from the base of the skull through the vertex without contrast.  Comparison: Multiple prior CT head examinations dating back to 06/09/2009, most recently 02/14/2003.  Findings: Ventricular system normal in size appearance for age, unchanged.  Cavum septum pellucidum.  No mass lesion.  No midline shift.  No acute hemorrhage or hematoma.  No extra-axial fluid collections.  No evidence of acute infarction.  No significant interval change.  No skull fracture or other focal osseous abnormality involving the skull.  Old fracture involving the medial wall of the right orbit, unchanged. Visualized paranasal sinuses, bilateral mastoid air cells, and bilateral middle ear cavities well-aerated.  IMPRESSION:  1.  Normal and stable intracranially. 2.  Stable old medial right orbital wall fracture.   Original Report Authenticated By: Hulan Saas, M.D.    Ct Abdomen Pelvis W Contrast  04/26/2013  *RADIOLOGY REPORT*  Clinical Data: Recurrent pancreatitis  CT ABDOMEN AND PELVIS WITH CONTRAST  Technique:  Multidetector CT imaging of the abdomen and pelvis was performed following the standard protocol during bolus administration of intravenous contrast.  Contrast: 50mL OMNIPAQUE IOHEXOL 300 MG/ML  SOLN, OMNIPAQUE IOHEXOL 300 MG/ML  SOLN  Comparison: CT 11/17/2012  Findings: Mild bilateral effusions and mild basilar atelectasis. No pericardial fluid.  No focal hepatic lesion.  The pancreas head and proximal body are normal.  There is significant inflammation involving the pancreatic distal body and tail.  There are no organized fluid collections but there is significant the peripancreatic fat inflammation and fluid along the left anterior pararenal space.  There is poor definition of the pancreatic parenchyma in the distal body and tail which could indicate necrosis.   There are several small gallstones in the gallbladder.  The pancreatic duct and common bile duct are prominent but within normal limits.  There is no evidence of vascular complication.  Again no organized fluid collections.  The spleen is normal.  The splenic vein appears patent.  Portal veins are patent.  The adrenal glands are normal.  The kidneys are normal.  The stomach, duodenum, small bowel, and colon unremarkable.  Abdominal aorta normal caliber.  No retroperitoneal periportal lymphadenopathy.  No free fluid the pelvis.  Prostate gland bladder normal.  No pelvic lymphadenopathy.  Fat filled inguinal hernias are noted.  No aggressive osseous lesions.  IMPRESSION:  1.  Acute pancreatitis involving the pancreatic body and tail. There is poor definition of the pancreatic parenchyma in this region which could indicate necrosis. 2.  No organized fluid collection.  Significant peripancreatic fat inflammation and fluid in the left upper quadrant. 3.  No evidence of vascular complication. 4.  Bilateral pleural effusions and mild basilar atelectasis.  5.  Several small gallstones within the gallbladder.  Cannot exclude  gallstone pancreatitis.   Original Report Authenticated By: Genevive Bi, M.D.    Dg Abd Acute W/chest  04/26/2013  *RADIOLOGY REPORT*  Clinical Data: Worsening pancreatitis  and sob.  ACUTE ABDOMEN SERIES (ABDOMEN 2 VIEW & CHEST 1 VIEW)  Comparison: 04/24/2013  Findings: Stable band-like opacities noted at the lung bases, with low lung volumes.  No cardiomegaly observed.  Abnormal dilated loops of bowel noted with air-fluid levels in the mid abdomen.  Mild prominence of gas noted at the splenic flexure. Large central collection of gas in the abdomen seems to low in position for gastric gas on the upright view, and may represent a distended transverse colon or a markedly distended loop of small bowel.  IMPRESSION:  1.  Scattered abnormal air-fluid levels in mildly dilated small bowel, compatible  with ileus. 2.  Abnormal gas filled structure in the central abdomen probably does not represent gas in the stomach, and accordingly probably represents a dilated segment of colon or small bowel.  CT scan of the abdomen with contrast should be considered for further characterization. 3.  Bibasilar atelectasis.   Original Report Authenticated By: Gaylyn Rong, M.D.      Assessment:  52 year old male with recurrent alcoholic pancreatitis, noted to have gallstones on CT. Lipase normalized. Tolerated diet but continued nausea, epigastric pain especially after large meal yesterday. Backed down to clear liquids since then. Dilaudid 4mg , Morphine 12mg , oxydodone IR 40mg  yesterday. Today he has had Dilaudid 2mg , Morphine 4mg , oxycodone IR 20mg  so far. Tympanic bowel sounds and abdominal distention, ?ileus vs obstruction.   Plan: 1. Alcohol cessation. 2. Await today's CT findings.  3. Add pancreatic enzymes. Start 500mg /kg lipase units per meal.   LOS: 5 days   Tana Coast  04/29/2013, 2:37 PM  Patient seen and examined this afternoon.  Reviewed CT with Dr. Tyron Russell. Pancreatitis essentially unchanged. Proximal colon distention more likely a sympathetic ileus related to close approximation to the inflammatory process. Mild rectal wall thickening present on prior CT as well (colonoscopy January 2014).  Patient doesn't have any symptoms of proctitis at this time. Agree with pancreatic enzyme supplementation. We'll continue to follow.

## 2013-04-29 NOTE — Progress Notes (Signed)
     Subjective: This man was admitted with alcoholic pancreatitis. He says he still has ongoing pain and his diet was switched back to liquid diet by gastroenterology yesterday. He is still complaining of pain and wishes to have his previous pain medications back, I believe this was hydromorphone.           Physical Exam: Blood pressure 134/88, pulse 88, temperature 98.5 F (36.9 C), temperature source Oral, resp. rate 20, height 5\' 10"  (1.778 m), weight 77.9 kg (171 lb 11.8 oz), SpO2 93.00%. He looks systemically well. Is not toxic or septic, but has a resting tachycardia. Abdomen is somewhat distended and slightly tense. Bowel sounds are heard and he is superficially tender. He is alert and orientated. Lung fields are clear.   Investigations:     Basic Metabolic Panel:  Recent Labs  16/10/96 0601 04/29/13 0536  NA 137 135  K 3.6 3.2*  CL 101 100  CO2 29 23  GLUCOSE 91 99  BUN <3* <3*  CREATININE 0.67 0.66  CALCIUM 7.8* 8.4   Liver Function Tests:  Recent Labs  04/28/13 0601 04/29/13 0536  AST 40* 25  ALT 22 17  ALKPHOS 98 92  BILITOT 0.8 0.7  PROT 6.2 6.4  ALBUMIN 2.6* 2.6*     CBC:  Recent Labs  04/27/13 0600 04/28/13 0601 04/29/13 0536  WBC 13.3* 10.1 9.0  NEUTROABS 11.2* 8.0*  --   HGB 12.0* 11.4* 12.1*  HCT 35.0* 33.5* 34.9*  MCV 94.1 95.2 93.6  PLT 199 245 299    No results found.    Medications: I have reviewed the patient's current medications.  Impression: 1. Acute alcoholic pancreatitis. I am not sure if his acute pancreatitis is getting worse and possibly developing necrosis. 2. Polysubstance drug abuse, current urine drug screen was positive for cocaine. 3. Hypertension. 4. Alcohol abuse. 5. Presence of gallstones, doubt that this is playing a role at this point.     Plan: 1. Intravenous hydromorphone as before when necessary. 2. CT scan of the abdomen and again today to see if there is any complications of  pancreatitis. 3. Not ready for discharge.     LOS: 5 days   Wilson Singer Pager [phone]  04/29/2013, 8:49 AM

## 2013-04-29 NOTE — Clinical Social Work Note (Addendum)
CSW spoke with pt this morning. He is open to [facility] and [facility] at d/c for inpatient treatment. CSW spoke to [place] at [facility] and was told to call when pt is stable for d/c to see if bed is available. Pt must be stable for d/c for referral to [facility] as well. CSW asked pt what his plan was if inpatient treatment bed was not available at d/c. He said, "I don't know." When asked if he had a place to stay, pt responded, "As far as I know." CSW to continue to follow. Pt understands that if inpatient bed is not found at his requested facilities, he will be d/c with outpatient follow up.   Derenda Fennel, [place] [phone]

## 2013-04-30 ENCOUNTER — Inpatient Hospital Stay (HOSPITAL_COMMUNITY): Payer: Medicare Other

## 2013-04-30 LAB — CBC
HCT: 33.9 % — ABNORMAL LOW (ref 39.0–52.0)
Hemoglobin: 11.8 g/dL — ABNORMAL LOW (ref 13.0–17.0)
MCH: 32.3 pg (ref 26.0–34.0)
MCHC: 34.8 g/dL (ref 30.0–36.0)
MCV: 92.9 fL (ref 78.0–100.0)
Platelets: 365 10*3/uL (ref 150–400)
RBC: 3.65 MIL/uL — ABNORMAL LOW (ref 4.22–5.81)
RDW: 16.2 % — ABNORMAL HIGH (ref 11.5–15.5)
WBC: 8.1 10*3/uL (ref 4.0–10.5)

## 2013-04-30 LAB — LIPASE, BLOOD: Lipase: 36 U/L (ref 11–59)

## 2013-04-30 LAB — COMPREHENSIVE METABOLIC PANEL
ALT: 12 U/L (ref 0–53)
AST: 17 U/L (ref 0–37)
Albumin: 2.5 g/dL — ABNORMAL LOW (ref 3.5–5.2)
Alkaline Phosphatase: 79 U/L (ref 39–117)
BUN: 3 mg/dL — ABNORMAL LOW (ref 6–23)
CO2: 23 mEq/L (ref 19–32)
Calcium: 8.4 mg/dL (ref 8.4–10.5)
Chloride: 99 mEq/L (ref 96–112)
Creatinine, Ser: 0.73 mg/dL (ref 0.50–1.35)
GFR calc Af Amer: >90 mL/min (ref >90)
GFR calc non Af Amer: >90 mL/min (ref >90)
Glucose, Bld: 98 mg/dL (ref 70–99)
Potassium: 3.4 mEq/L — ABNORMAL LOW (ref 3.5–5.1)
Sodium: 133 mEq/L — ABNORMAL LOW (ref 135–145)
Total Bilirubin: 0.5 mg/dL (ref 0.3–1.2)
Total Protein: 6.2 g/dL (ref 6.0–8.3)

## 2013-04-30 MED ORDER — FLEET ENEMA 7-19 GM/118ML RE ENEM: 1.0000 | ENEMA | Freq: Every day | RECTAL | Status: DC | PRN

## 2013-04-30 MED ORDER — HEPARIN SODIUM (PORCINE) 5000 UNIT/ML IJ SOLN
5000.0000 [IU] | Freq: Three times a day (TID) | INTRAMUSCULAR | Status: DC
  Administered 2013-04-30 – 2013-05-01 (×3): 5000 [IU] via SUBCUTANEOUS
  Filled 2013-04-30 (×2): qty 1

## 2013-04-30 MED ORDER — HYDROMORPHONE HCL PF 1 MG/ML IJ SOLN
1.0000 mg | Freq: Four times a day (QID) | INTRAMUSCULAR | Status: DC | PRN
  Administered 2013-04-30 – 2013-05-01 (×3): 1 mg via INTRAVENOUS
  Filled 2013-04-30 (×3): qty 1

## 2013-04-30 NOTE — Progress Notes (Signed)
REVIEWED.  

## 2013-04-30 NOTE — Progress Notes (Signed)
Notified Dr. Nobie Putnam of pt abd xray results.  He modified pts orders. Will continue to monitor.

## 2013-04-30 NOTE — Clinical Social Work Note (Signed)
Patient wanted to speak w CSW re discharge plans and possibilities for admission for SA tx.  Per CSW [place], patient will need to be faxed out to SA tx facilities on day of discharge as they do not guarantee beds in advance.  Since patient will dc on weekend, this will need to be done by ACT team member on call.  Explained process to pt, he wants to be admitted to either [facility] or [facility].  Also asked about [facility].  Asked for list of SA tx facilities, CSW will provide if available.  Pt says he can go stay w friend for several days, then is willing to contact [facility] to explore SA tx options next week.  At discharge, hospital discharge appt can be arranged for patient.  Santa Genera, LCSW Clinical Social Worker [phone])

## 2013-04-30 NOTE — BH Assessment (Addendum)
Assessment Note   [name] [name] is an 52 y.o. male. Spoke with pt regarding rehab facilities.  Pt reports he did not want to go to a facility until Monday.  Called [facility] no male beds.  Pt reports he has been informed by [facility] tha he can not return there.  [facility] [address] [phone] and was informed they do have a male rehab bed but he can't be processed until Monday  Between 9-5. Pt reported the social worker had mentioned something to him about [facility] but was unable to find from [employer].  Pt reports if he is d/c tomorrow that he did not want to go to rehab until Monday.  Pt given list of numbers to contact facilities. He reports he has transportation to get to a acility and he has his medications to take with him which is required.  Pt reported he does not want to go to [facility] in [place].   Pt denies s/i, h/i, and is not psychotic.  Pt also reports he has two up coming court dates for DUI.  One is 5/13 and the other is 5/20.      Axis I: 303.90 Alcohol Dependency: 305.60 Cocaine Abuse Axis II: Deferred Axis III:  Past Medical History  Diagnosis Date  . Bronchitis   . Acid reflux   . Pancreatitis     March 2013  . Gout   . Bipolar disorder   . Hyperlipidemia   . Alcohol abuse     6-8 cans of beer daily  . Pseudocyst of pancreas 02/28/2012  . Arthritis   . Fracture of lower leg   . Hypertension   . Chronic pain   . Chronic neck pain   . Chronic back pain   . Left arm pain     chronic   Axis IV: economic problems, occupational problems, other psychosocial or environmental problems, problems related to legal system/crime and problems with access to health care services Axis V: 51-60 moderate symptoms  Past Medical History:  Past Medical History  Diagnosis Date  . Bronchitis   . Acid reflux   . Pancreatitis     March 2013  . Gout   . Bipolar disorder   . Hyperlipidemia   . Alcohol abuse     6-8 cans of beer daily  . Pseudocyst of pancreas 02/28/2012  .  Arthritis   . Fracture of lower leg   . Hypertension   . Chronic pain   . Chronic neck pain   . Chronic back pain   . Left arm pain     chronic    Past Surgical History  Procedure Laterality Date  . Nose surgery      broken nose  . Circumcision  03/17/2012    Procedure: CIRCUMCISION ADULT;  Surgeon: Ky Barban, MD;  Location: [facility];  Service: Urology;  Laterality: N/A;  . Colonoscopy with propofol  12/24/2012    Procedure: COLONOSCOPY WITH PROPOFOL;  Surgeon: Corbin Ade, MD;  Location: [facility];  Service: Endoscopy;  Laterality: N/A;  started at 0803, in cecum at 0812, withdrawel time Biopsy of anal lesion  . Esophagogastroduodenoscopy (egd) with propofol  12/24/2012    Procedure: ESOPHAGOGASTRODUODENOSCOPY (EGD) WITH PROPOFOL;  Surgeon: Corbin Ade, MD;  Location: [facility];  Service: Endoscopy;  Laterality: N/A;  done at 0800    Family History:  Family History  Problem Relation Age of Onset  . Diabetes Father   . Anesthesia  problems Neg Hx   . Hypotension Neg Hx   . Malignant hyperthermia Neg Hx   . Pseudochol deficiency Neg Hx   . Colon cancer Neg Hx     Social History:  reports that he has been smoking Cigarettes.  He has a 12.5 pack-year smoking history. He does not have any smokeless tobacco history on file. He reports that  drinks alcohol. He reports that he uses illicit drugs (Cocaine and Marijuana).  Additional Social History:  Alcohol / Drug Use Pain Medications: na Prescriptions: na Over the Counter: na History of alcohol / drug use?: Yes Substance #1 Name of Substance 1: alcohol 1 - Age of First Use: 17 1 - Amount (size/oz): 1 pt 1 - Frequency: daily 1 - Duration: years 1 - Last Use / Amount: 04/22/13-1 pt liquor Substance #2 Name of Substance 2: crack/cocaine 2 - Age of First Use: 30 2 - Amount (size/oz): $10 2 - Frequency: 2 x week 2 - Duration: years 2 - Last Use / Amount: 04/22/13  CIWA: CIWA-Ar BP: 146/94 mmHg Pulse Rate: 97 COWS:     Allergies:  Allergies  Allergen Reactions  . Penicillins Itching    Home Medications:  Medications Prior to Admission  Medication Sig Dispense Refill  . albuterol (PROVENTIL HFA;VENTOLIN HFA) 108 (90 BASE) MCG/ACT inhaler Inhale 2 puffs into the lungs every 4 (four) hours as needed for wheezing. For asthma/wheezing  1 Inhaler  2  . amLODipine (NORVASC) 5 MG tablet Take 1 tablet (5 mg total) by mouth daily. For high blood pressure control  30 tablet  0  . citalopram (CELEXA) 10 MG tablet Take 10 mg by mouth daily.      . fish oil-omega-3 fatty acids 1000 MG capsule Take 1 capsule (1 g total) by mouth daily. For cholesterol control      . gemfibrozil (LOPID) 600 MG tablet Take 1 tablet (600 mg total) by mouth 2 (two) times daily before a meal. Cholesterol control  60 tablet  3  . ibuprofen (ADVIL,MOTRIN) 200 MG tablet Take 600 mg by mouth every 6 (six) hours as needed for pain.      . methocarbamol (ROBAXIN) 500 MG tablet Take 1 tablet (500 mg total) by mouth 3 (three) times daily at 8am, 2pm and bedtime. For muscle spasms  90 tablet  0  . omeprazole (PRILOSEC) 20 MG capsule Take 1 capsule (20 mg total) by mouth daily. For acid reflux  30 capsule  3  . prochlorperazine (COMPAZINE) 10 MG tablet Take 1 tablet by mouth daily as needed (nausea).       . tamsulosin (FLOMAX) 0.4 MG CAPS Take 1 capsule (0.4 mg total) by mouth daily. For enlarged prostate  30 capsule  6  . traZODone (DESYREL) 100 MG tablet Take 1 tablet by mouth at bedtime as needed for sleep.         OB/GYN Status:  No LMP for male patient.  General Assessment Data Location of Assessment: [facility] ACT Assessment: Yes Living Arrangements: Other relatives (sister) Can pt return to current living arrangement?: Yes Admission Status: Voluntary Is patient capable of signing voluntary admission?: Yes Transfer from: Acute Hospital Referral Source: MD (dr Lendell Caprice  medical floor)     Risk to self Suicidal Ideation: No Suicidal  Intent: No Is patient at risk for suicide?: No Suicidal Plan?: No Access to Means: No What has been your use of drugs/alcohol within the last 12 months?: alcohol and crack/cocaine Previous Attempts/Gestures: No How many times?:  0 Other Self Harm Risks: denies Triggers for Past Attempts: None known Intentional Self Injurious Behavior: None Family Suicide History: No Recent stressful life event(s): Recent negative physical changes Persecutory voices/beliefs?: No Depression: Yes Depression Symptoms: Despondent Substance abuse history and/or treatment for substance abuse?: Yes Suicide prevention information given to non-admitted patients: Yes  Risk to Others Homicidal Ideation: No Thoughts of Harm to Others: No Current Homicidal Intent: No Current Homicidal Plan: No Access to Homicidal Means: No Identified Victim: na History of harm to others?: No Assessment of Violence: None Noted Violent Behavior Description: na Does patient have access to weapons?: No Criminal Charges Pending?: No Does patient have a court date: No  Psychosis Hallucinations: None noted Delusions: None noted  Mental Status Report Appear/Hygiene: Improved Eye Contact: Good Motor Activity: Freedom of movement Speech: Logical/coherent Level of Consciousness: Alert Mood: Depressed Affect: Depressed;Appropriate to circumstance Anxiety Level: Minimal Thought Processes: Coherent;Relevant Judgement: Impaired Orientation: Person;Place;Time;Situation Obsessive Compulsive Thoughts/Behaviors: None  Cognitive Functioning Concentration: Normal Memory: Recent Intact;Remote Intact IQ: Average Insight: Poor Impulse Control: Poor Appetite: Fair Sleep: No Change Total Hours of Sleep: 5 Vegetative Symptoms: None  ADLScreening [facility] Assessment Services) Patient's cognitive ability adequate to safely complete daily activities?: Yes Patient able to express need for assistance with ADLs?: Yes Independently  performs ADLs?: Yes (appropriate for developmental age)  Abuse/Neglect [facility]) Physical Abuse: Denies Verbal Abuse: Denies Sexual Abuse: Denies  Prior Inpatient Therapy Prior Inpatient Therapy: Yes Prior [facility] Dates: [id number]  [facility] 2014 [facility] Reason for Treatment: detox and rehab  Prior Outpatient Therapy Prior Outpatient Therapy: No Prior Therapy Dates: na Prior Therapy Facilty/Provider(s): na Reason for Treatment: na  ADL Screening (condition at time of admission) Patient's cognitive ability adequate to safely complete daily activities?: Yes Patient able to express need for assistance with ADLs?: Yes Independently performs ADLs?: Yes (appropriate for developmental age) Weakness of Legs: Both Weakness of Arms/Hands: None  Home Assistive Devices/Equipment Home Assistive Devices/Equipment: None  Therapy Consults (therapy consults require a physician order) PT Evaluation Needed: No OT Evalulation Needed: No SLP Evaluation Needed: No Abuse/Neglect Assessment (Assessment to be complete while patient is alone) Physical Abuse: Denies Verbal Abuse: Denies Sexual Abuse: Denies Exploitation of patient/patient's resources: Denies Self-Neglect: Denies Values / Beliefs Cultural Requests During Hospitalization: None Spiritual Requests During Hospitalization: None Consults Spiritual Care Consult Needed: No Social Work Consult Needed: No Merchant navy officer (For Healthcare) Advance Directive: Patient does not have advance directive;Patient would like information Pre-existing out of facility DNR order (yellow form or pink MOST form): No Nutrition Screen- [facility] Patient's home diet: Regular Have you recently lost weight without trying?: No Have you been eating poorly because of a decreased appetite?: No Malnutrition Screening Tool Score: 0  Additional Information 1:1 In Past 12 Months?: No CIRT Risk: No Elopement Risk:  No Does patient have medical clearance?: No     Disposition: Given Referrals Disposition Initial Assessment Completed for this Encounter: Yes Disposition of Patient: Referred to Patient referred to:  (given referrals)  On Site Evaluation by:   Reviewed with Physician:     Hattie Perch Winford 04/30/2013 8:33 PM

## 2013-04-30 NOTE — Progress Notes (Signed)
Fleets enema given as prescribed.  Asked pt to let me see the results of the enema, but he stated he had someone from the kitchen see the stool.  States he stomach still hurts.  Act team consult called by Ruthy Dick.

## 2013-04-30 NOTE — Progress Notes (Signed)
Subjective: This man was admitted with alcoholic pancreatitis. He says he is clearly improved and his pain is less. He is tolerated liquid diet. He is walking in the hallway.           Physical Exam: Blood pressure 136/90, pulse 87, temperature 97.9 F (36.6 C), temperature source Oral, resp. rate 16, height 5\' 10"  (1.778 m), weight 77.9 kg (171 lb 11.8 oz), SpO2 98.00%. He looks systemically well. Is not toxic or septic. Abdomen is somewhat distended and slightly tense. Bowel sounds are heard and he is superficially tender. He is alert and orientated. Lung fields are clear.   Investigations:     Basic Metabolic Panel:  Recent Labs  04/54/09 0536 04/30/13 0556  NA 135 133*  K 3.2* 3.4*  CL 100 99  CO2 23 23  GLUCOSE 99 98  BUN <3* 3*  CREATININE 0.66 0.73  CALCIUM 8.4 8.4   Liver Function Tests:  Recent Labs  04/29/13 0536 04/30/13 0556  AST 25 17  ALT 17 12  ALKPHOS 92 79  BILITOT 0.7 0.5  PROT 6.4 6.2  ALBUMIN 2.6* 2.5*     CBC:  Recent Labs  04/28/13 0601 04/29/13 0536 04/30/13 0556  WBC 10.1 9.0 8.1  NEUTROABS 8.0*  --   --   HGB 11.4* 12.1* 11.8*  HCT 33.5* 34.9* 33.9*  MCV 95.2 93.6 92.9  PLT 245 299 365    Ct Abdomen Pelvis W Contrast  04/29/2013  *RADIOLOGY REPORT*  Clinical Data: Worsening abdominal pain in a patient with current history of acute pancreatitis.  CT ABDOMEN AND PELVIS WITH CONTRAST  Technique:  Multidetector CT imaging of the abdomen and pelvis was performed following the standard protocol during bolus administration of intravenous contrast.  Contrast:  100 ml Omnipaque-300 IV.  Oral contrast also administered.  Comparison: CT abdomen and pelvis 04/26/2013, 11/17/2012.  Findings: Acute inflammatory changes surrounding the distal body and tail of the pancreas as noted previously, without evidence of pseudocyst formation.  Again, there is incomplete enhancement of the tail of the pancreas, though this distribution is  similar to the prior examination.  Splenic vein occluded, with numerous collateral veins in the left upper quadrant, unchanged.  The head, uncinate and proximal body of the pancreas are unremarkable.  Diffuse hepatic steatosis, less severe currently, without focal hepatic parenchymal abnormality.  Normal appearing spleen, adrenal glands, and kidneys.  2 very small calcified gallstones in the gallbladder without CT evidence of acute cholecystitis.  No biliary ductal dilation.  Calcified and noncalcified plaque involving the distal abdominal aorta without aneurysm.  Widely patent visceral arteries.  Normal-sized lymph nodes throughout the retroperitoneum, without evidence of significant lymphadenopathy.  Stomach decompressed.  Wall thickening involving the fundus and proximal body may be due to secondary inflammation from the adjacent pancreatitis.  Small bowel normal in appearance.  Marked gaseous distention of the cecum (which extends across the midline into the left upper abdomen) and gaseous distention of the ascending and transverse colon.  Remainder of the colon relatively decompressed.  Circumferential wall thickening involving the rectum, not present on the prior study.  Urinary bladder unremarkable.  Prostate gland seminal vesicles normal for age.  Phleboliths low in both sides of the pelvis.  Bone window images unremarkable apart from minimal degenerative changes involving the lumbar spine.  Small bilateral pleural effusions, right greater than left, and atelectasis in the lower lobes.  Patchy airspace opacities in the right middle lobe.  Heart upper normal in  size to perhaps slightly enlarged with left ventricular predominance.  IMPRESSION:  1.  Acute pancreatitis involving the distal body and tail of the pancreas.  Lack of enhancement of a segment of the pancreatic tail may indicate necrosis, though this appearance is unchanged since the examination 3 days ago.  No evidence of pseudocyst formation. 2.   Splenic vein occlusion as noted previously with collaterals in the perigastric region. 3.  Possible secondary inflammation of the gastric fundus and proximal body of the stomach in the vicinity of the acute pancreatitis. 4. Ileus involving the cecum, ascending colon, and transverse colon.  Query acute proctitis, as there is circumferential thickening of the rectum which was not visualized on the prior examination. 5.  Diffuse hepatic steatosis, less severe than on the prior examination. 6.  Cholelithiasis without CT evidence of acute cholecystitis. 7.  Bilateral pleural effusions, right greater than left, and associated atelectasis in the lower lobes.  Patchy atelectasis or pneumonia in the right middle lobe.   Original Report Authenticated By: Hulan Saas, M.D.       Medications: I have reviewed the patient's current medications.  Impression: 1. Acute alcoholic pancreatitis, improving. Repeat CT scan does not show any worsening. 2. Polysubstance drug abuse, current urine drug screen was positive for cocaine. 3. Hypertension. 4. Alcohol abuse. 5. Presence of gallstones, doubt that this is playing a role at this point.     Plan: 1. Discontinue IV fluids. 2. Advance diet. 3. He should be medically stable for discharge tomorrow.     LOS: 6 days   Wilson Singer Pager [phone]  04/30/2013, 11:11 AM

## 2013-04-30 NOTE — Plan of Care (Signed)
Problem: Phase III Progression Outcomes Goal: Tolerating diet Outcome: Completed/Met Date Met:  04/30/13 Will monitor him for lunch on a full liquid diet then progress him to regular food as orderd for progression of the diet.  He has had a full liquid diet for breakfast and has not complained of nausea, vomiting, or pain.

## 2013-04-30 NOTE — Progress Notes (Signed)
Subjective:  Looks better today. Eating full liquids and ambulating in hallway.   Objective: Vital signs in last 24 hours: Temp:  [97.9 F (36.6 C)-98.2 F (36.8 C)] 97.9 F (36.6 C) (05/09 0444) Pulse Rate:  [84-99] 87 (05/09 0444) Resp:  [16-20] 16 (05/09 0444) BP: (130-141)/(86-98) 136/90 mmHg (05/09 0444) SpO2:  [93 %-99 %] 98 % (05/09 0444) Last BM Date: 04/30/13 General:   Alert,  Well-developed, well-nourished, pleasant and cooperative in NAD Head:  Normocephalic and atraumatic. Eyes:  Sclera clear, no icterus.   Abdomen:  Soft, less tender and nondistended. No masses, hepatosplenomegaly or hernias noted. Normal bowel sounds, without guarding, and without rebound.   Extremities:  Without clubbing, deformity or edema. Neurologic:  Alert and  oriented x4;  grossly normal neurologically. Skin:  Intact without significant lesions or rashes. Psych:  Alert and cooperative. Normal mood and affect.  Intake/Output from previous day: 05/08 0701 - 05/09 0700 In: 680 [P.O.:680] Out: -  Intake/Output this shift: Total I/O In: 240 [P.O.:240] Out: -   Lab Results: CBC  Recent Labs  04/28/13 0601 04/29/13 0536 04/30/13 0556  WBC 10.1 9.0 8.1  HGB 11.4* 12.1* 11.8*  HCT 33.5* 34.9* 33.9*  MCV 95.2 93.6 92.9  PLT 245 299 365   BMET  Recent Labs  04/28/13 0601 04/29/13 0536 04/30/13 0556  NA 137 135 133*  K 3.6 3.2* 3.4*  CL 101 100 99  CO2 29 23 23   GLUCOSE 91 99 98  BUN <3* <3* 3*  CREATININE 0.67 0.66 0.73  CALCIUM 7.8* 8.4 8.4   LFTs  Recent Labs  04/28/13 0601 04/29/13 0536 04/30/13 0556  BILITOT 0.8 0.7 0.5  ALKPHOS 98 92 79  AST 40* 25 17  ALT 22 17 12   PROT 6.2 6.4 6.2  ALBUMIN 2.6* 2.6* 2.5*    Recent Labs  04/28/13 0601 04/30/13 0556  LIPASE 35 36   PT/INR No results found for this basename: LABPROT, INR,  in the last 72 hours    Imaging Studies:   Ct Abdomen Pelvis W Contrast  04/29/2013  *RADIOLOGY REPORT*  Clinical Data:  Worsening abdominal pain in a patient with current history of acute pancreatitis.  CT ABDOMEN AND PELVIS WITH CONTRAST  Technique:  Multidetector CT imaging of the abdomen and pelvis was performed following the standard protocol during bolus administration of intravenous contrast.  Contrast:  100 ml Omnipaque-300 IV.  Oral contrast also administered.  Comparison: CT abdomen and pelvis 04/26/2013, 11/17/2012.  Findings: Acute inflammatory changes surrounding the distal body and tail of the pancreas as noted previously, without evidence of pseudocyst formation.  Again, there is incomplete enhancement of the tail of the pancreas, though this distribution is similar to the prior examination.  Splenic vein occluded, with numerous collateral veins in the left upper quadrant, unchanged.  The head, uncinate and proximal body of the pancreas are unremarkable.  Diffuse hepatic steatosis, less severe currently, without focal hepatic parenchymal abnormality.  Normal appearing spleen, adrenal glands, and kidneys.  2 very small calcified gallstones in the gallbladder without CT evidence of acute cholecystitis.  No biliary ductal dilation.  Calcified and noncalcified plaque involving the distal abdominal aorta without aneurysm.  Widely patent visceral arteries.  Normal-sized lymph nodes throughout the retroperitoneum, without evidence of significant lymphadenopathy.  Stomach decompressed.  Wall thickening involving the fundus and proximal body may be due to secondary inflammation from the adjacent pancreatitis.  Small bowel normal in appearance.  Marked gaseous distention of the cecum (  which extends across the midline into the left upper abdomen) and gaseous distention of the ascending and transverse colon.  Remainder of the colon relatively decompressed.  Circumferential wall thickening involving the rectum, not present on the prior study.  Urinary bladder unremarkable.  Prostate gland seminal vesicles normal for age.   Phleboliths low in both sides of the pelvis.  Bone window images unremarkable apart from minimal degenerative changes involving the lumbar spine.  Small bilateral pleural effusions, right greater than left, and atelectasis in the lower lobes.  Patchy airspace opacities in the right middle lobe.  Heart upper normal in size to perhaps slightly enlarged with left ventricular predominance.  IMPRESSION:  1.  Acute pancreatitis involving the distal body and tail of the pancreas.  Lack of enhancement of a segment of the pancreatic tail may indicate necrosis, though this appearance is unchanged since the examination 3 days ago.  No evidence of pseudocyst formation. 2.  Splenic vein occlusion as noted previously with collaterals in the perigastric region. 3.  Possible secondary inflammation of the gastric fundus and proximal body of the stomach in the vicinity of the acute pancreatitis. 4. Ileus involving the cecum, ascending colon, and transverse colon.  Query acute proctitis, as there is circumferential thickening of the rectum which was not visualized on the prior examination. 5.  Diffuse hepatic steatosis, less severe than on the prior examination. 6.  Cholelithiasis without CT evidence of acute cholecystitis. 7.  Bilateral pleural effusions, right greater than left, and associated atelectasis in the lower lobes.  Patchy atelectasis or pneumonia in the right middle lobe.   Original Report Authenticated By: Hulan Saas, M.D.      Assessment: 52 year old male with recurrent alcoholic pancreatitis, noted to have gallstones on CT. Lipase normalized. Tolerating full liquid diet. Clinically improved. Several bowel movements in the last 24 hours.   Plan: 1. Avoid alcohol 2. Discharge on pancreatic enzymes. 500 mg per kilogram of lipase units per meal and half of that per snack.   LOS: 6 days   Tana Coast  04/30/2013, 12:41 PM

## 2013-04-30 NOTE — Progress Notes (Signed)
Progressing the patients diet up to a regular diet.  He stated that he tolerated his lunch and breakfast without difficulty.  He added chesse its crackers to his soup for lunch.  Some complaints of pain.

## 2013-04-30 NOTE — Progress Notes (Signed)
Notified Dr. Darrick Penna at approximately 1630 that the patient was c/o abdominal distension and discomfort.  I voiced to her that the patient had breakfast and lunch full liquid diet even adding crackers to his soup.  He had no complaints after lunch.  He is visibly uncomfortable.  Voiced to her that he ask if he could have an enema. I also recommend simethicone because he had previously in the day had to senna tablets.  Dr. Jettie Booze stated she voiced to him that he needed to ambulate and watch his meals he would need to stop eating when he is full.  She states she will place in orders.   1700  Notified Dr. Nobie Putnam of the situation above.  I also voiced to him that I had heard Verlon Au with GI services explain to him that he should limit his pain medication as much as possible because it slows down the GI tract. She said for him to use the pain med as needed, but to use it as less as possible.  He verbalized understanding, and teach back was voiced by him saying that Maisie Fus, [place] had told him the same thing.  He continues to call for the medication for pain Q2 hours.  Dr. Nobie Putnam verbalized understanding and placed in new orders.

## 2013-05-01 LAB — COMPREHENSIVE METABOLIC PANEL
ALT: 10 U/L (ref 0–53)
AST: 18 U/L (ref 0–37)
Albumin: 2.5 g/dL — ABNORMAL LOW (ref 3.5–5.2)
Alkaline Phosphatase: 76 U/L (ref 39–117)
BUN: 6 mg/dL (ref 6–23)
CO2: 25 mEq/L (ref 19–32)
Calcium: 8.6 mg/dL (ref 8.4–10.5)
Chloride: 98 mEq/L (ref 96–112)
Creatinine, Ser: 0.79 mg/dL (ref 0.50–1.35)
GFR calc Af Amer: >90 mL/min (ref >90)
GFR calc non Af Amer: >90 mL/min (ref >90)
Glucose, Bld: 88 mg/dL (ref 70–99)
Potassium: 3.5 mEq/L (ref 3.5–5.1)
Sodium: 133 mEq/L — ABNORMAL LOW (ref 135–145)
Total Bilirubin: 0.4 mg/dL (ref 0.3–1.2)
Total Protein: 6.3 g/dL (ref 6.0–8.3)

## 2013-05-01 LAB — CBC
HCT: 32.6 % — ABNORMAL LOW (ref 39.0–52.0)
Hemoglobin: 11.6 g/dL — ABNORMAL LOW (ref 13.0–17.0)
MCH: 33 pg (ref 26.0–34.0)
MCHC: 35.6 g/dL (ref 30.0–36.0)
MCV: 92.6 fL (ref 78.0–100.0)
Platelets: 469 10*3/uL — ABNORMAL HIGH (ref 150–400)
RBC: 3.52 MIL/uL — ABNORMAL LOW (ref 4.22–5.81)
RDW: 15.9 % — ABNORMAL HIGH (ref 11.5–15.5)
WBC: 7.6 10*3/uL (ref 4.0–10.5)

## 2013-05-01 MED ORDER — PANCRELIPASE (LIP-PROT-AMYL) 12000-38000 UNITS PO CPEP: 4.0000 | ORAL_CAPSULE | Freq: Three times a day (TID) | ORAL | Status: DC

## 2013-05-01 MED ORDER — SENNOSIDES-DOCUSATE SODIUM 8.6-50 MG PO TABS: 2.0000 | ORAL_TABLET | Freq: Every day | ORAL | Status: DC

## 2013-05-01 MED ORDER — THIAMINE HCL 100 MG PO TABS: 100.0000 mg | ORAL_TABLET | Freq: Every day | ORAL | Status: DC

## 2013-05-01 NOTE — Discharge Summary (Signed)
Physician Discharge Summary  [name] [name] ZOX:[id number] DOB: [birth year] DOA: 04/24/2013  PCP: Milinda Antis, MD  Admit date: 04/24/2013 Discharge date: 05/01/2013  Time spent: Greater than 30 minutes  Recommendations for Outpatient Follow-up:  1. Followup with primary care physician.  2. Followup for  rehabilitation for alcoholism.  Discharge Diagnoses:  1. Acute alcoholic pancreatitis. 2. Alcoholism. 3. Ileus related to pancreatitis, resolving. 4. Hypertension. 5. Polysubstance drug abuse.   Discharge Condition: Stable and improving.  Diet recommendation: Advance diet as tolerated.  Filed Weights   04/24/13 1545  Weight: 77.9 kg (171 lb 11.8 oz)    History of present illness:  This 52 year old man presented to the hospital with symptoms of abdominal pain and vomiting. Please see initial history as outlined below: [name] [name] is an 52 y.o. male with a history of recurrent admissions for alcoholic pancreatitis. He has been drinking again and using cocaine. He comes in with epigastric pain and vomiting. Symptoms began last night. He's been drinking about a pint of liquor a day. Smokes cocaine and cigarettes. He's had no hematemesis. No fevers or chills. He has had multiple social work interventions and has been to behavioral health for drug treatment earlier this year. In the emergency room, he was found to have an elevated lipase. His pain is uncontrolled after several doses of fentanyl.  Hospital Course:  The patient was admitted with quite severe pancreatitis and was started on intravenous fluids, n.p.o. and analgesia as required. He was slow to progress. In fact, repeat CT scan had to be done because his progression was not as expected and fortunately, the CT scan repeat did not show any progression. Abdominal x-ray showed the presence of ileus. His intravenous opioids was reduced and he was given enema. This improvement is significantly and he is now able to tolerate a diet without  vomiting. His abdominal pain and distention have also improved. He was seen, during hospitalization a gastroenterology, Dr. Kendell Bane, who has helped in management of his condition. He also wishes treatment for his alcoholism and he has been given information regarding this. He is now stable for discharge.  Procedures:  None.  Consultations:  Gastroenterology, Dr. Kendell Bane.  Discharge Exam: Filed Vitals:   04/30/13 1400 04/30/13 2059 05/01/13 0505 05/01/13 0812  BP: 146/94 138/94 133/90   Pulse: 97 84 87   Temp: 97.6 F (36.4 C) 98.3 F (36.8 C) 98.5 F (36.9 C)   TempSrc: Oral Oral Oral   Resp: 16 20 20    Height:      Weight:      SpO2: 95% 97% 99% 99%    General: He looks systemically well. Is not toxic or septic. Cardiovascular: Heart sounds are present and normal without murmurs. Respiratory: Lung fields are clear. Abdomen is soft and nontender. Bowel sounds are heard. He is alert and orientated.  Discharge Instructions  Discharge Orders   Future Appointments Provider Department Dept Phone   05/10/2013 9:30 AM Salley Scarlet, MD [facility] [phone]   Future Orders Complete By Expires     Diet - low sodium heart healthy  As directed     Increase activity slowly  As directed         Medication List    STOP taking these medications       ibuprofen 200 MG tablet  Commonly known as:  ADVIL,MOTRIN     methocarbamol 500 MG tablet  Commonly known as:  ROBAXIN      TAKE these medications  albuterol 108 (90 BASE) MCG/ACT inhaler  Commonly known as:  PROVENTIL HFA;VENTOLIN HFA  Inhale 2 puffs into the lungs every 4 (four) hours as needed for wheezing. For asthma/wheezing     amLODipine 5 MG tablet  Commonly known as:  NORVASC  Take 1 tablet (5 mg total) by mouth daily. For high blood pressure control     citalopram 10 MG tablet  Commonly known as:  CELEXA  Take 10 mg by mouth daily.     fish oil-omega-3 fatty acids 1000 MG capsule  Take 1  capsule (1 g total) by mouth daily. For cholesterol control     gemfibrozil 600 MG tablet  Commonly known as:  LOPID  Take 1 tablet (600 mg total) by mouth 2 (two) times daily before a meal. Cholesterol control     lipase/protease/amylase 65784 UNITS Cpep  Commonly known as:  CREON-12/PANCREASE  Take 4 capsules by mouth 3 (three) times daily before meals.     omeprazole 20 MG capsule  Commonly known as:  PRILOSEC  Take 1 capsule (20 mg total) by mouth daily. For acid reflux     prochlorperazine 10 MG tablet  Commonly known as:  COMPAZINE  Take 1 tablet by mouth daily as needed (nausea).     senna-docusate 8.6-50 MG per tablet  Commonly known as:  Senokot-S  Take 2 tablets by mouth daily.     tamsulosin 0.4 MG Caps  Commonly known as:  FLOMAX  Take 1 capsule (0.4 mg total) by mouth daily. For enlarged prostate     thiamine 100 MG tablet  Take 1 tablet (100 mg total) by mouth daily.     traZODone 100 MG tablet  Commonly known as:  DESYREL  Take 1 tablet by mouth at bedtime as needed for sleep.       Allergies  Allergen Reactions  . Penicillins Itching       Follow-up Information   Follow up with Randall An, MD. (lab work)    Contact information:   [address] [place] [address] [phone]        The results of significant diagnostics from this hospitalization (including imaging, microbiology, ancillary and laboratory) are listed below for reference.    Significant Diagnostic Studies: Dg Abd 1 View  04/30/2013  *RADIOLOGY REPORT*  Clinical Data: Bowel obstruction.  Pain and swelling in the abdomen.  ABDOMEN - 1 VIEW  Comparison: CT 04/29/2013.  Radiographs 04/26/2013.  Findings: There is no supine plain film evidence of free air in the abdomen on this radiograph although the supine radiographs are suboptimal evaluation.  There has been continued expansion of colon in the right lower quadrant, there is persistent gaseous dilation of the cecum, today  measuring 12.8 cm, previously 13.8 cm.  In the setting of acute pancreatitis, this is compatible with functional obstruction associated with the colonic ileus (the so-called colon cutoff sign).  And the gaseous distention of colon terminates in the descending colon.  There is little if any distal bowel gas.  IMPRESSION: Slightly decreased colonic gaseous distention compared to 04/26/2013.  In the setting of acute pancreatitis, findings are consistent with colon cutoff sign and ileus secondary to pancreatitis.   Original Report Authenticated By: Andreas Newport, M.D.    Ct Head Wo Contrast  04/04/2013  *RADIOLOGY REPORT*  Clinical Data: Headache.  Hypotension.  Acute mental status changes.  CT HEAD WITHOUT CONTRAST  Technique:  Contiguous axial images were obtained from the base of the skull through the  vertex without contrast.  Comparison: Multiple prior CT head examinations dating back to 06/09/2009, most recently 02/14/2003.  Findings: Ventricular system normal in size appearance for age, unchanged.  Cavum septum pellucidum.  No mass lesion.  No midline shift.  No acute hemorrhage or hematoma.  No extra-axial fluid collections.  No evidence of acute infarction.  No significant interval change.  No skull fracture or other focal osseous abnormality involving the skull.  Old fracture involving the medial wall of the right orbit, unchanged. Visualized paranasal sinuses, bilateral mastoid air cells, and bilateral middle ear cavities well-aerated.  IMPRESSION:  1.  Normal and stable intracranially. 2.  Stable old medial right orbital wall fracture.   Original Report Authenticated By: Hulan Saas, M.D.    Ct Abdomen Pelvis W Contrast  04/29/2013  *RADIOLOGY REPORT*  Clinical Data: Worsening abdominal pain in a patient with current history of acute pancreatitis.  CT ABDOMEN AND PELVIS WITH CONTRAST  Technique:  Multidetector CT imaging of the abdomen and pelvis was performed following the standard protocol during  bolus administration of intravenous contrast.  Contrast:  100 ml Omnipaque-300 IV.  Oral contrast also administered.  Comparison: CT abdomen and pelvis 04/26/2013, 11/17/2012.  Findings: Acute inflammatory changes surrounding the distal body and tail of the pancreas as noted previously, without evidence of pseudocyst formation.  Again, there is incomplete enhancement of the tail of the pancreas, though this distribution is similar to the prior examination.  Splenic vein occluded, with numerous collateral veins in the left upper quadrant, unchanged.  The head, uncinate and proximal body of the pancreas are unremarkable.  Diffuse hepatic steatosis, less severe currently, without focal hepatic parenchymal abnormality.  Normal appearing spleen, adrenal glands, and kidneys.  2 very small calcified gallstones in the gallbladder without CT evidence of acute cholecystitis.  No biliary ductal dilation.  Calcified and noncalcified plaque involving the distal abdominal aorta without aneurysm.  Widely patent visceral arteries.  Normal-sized lymph nodes throughout the retroperitoneum, without evidence of significant lymphadenopathy.  Stomach decompressed.  Wall thickening involving the fundus and proximal body may be due to secondary inflammation from the adjacent pancreatitis.  Small bowel normal in appearance.  Marked gaseous distention of the cecum (which extends across the midline into the left upper abdomen) and gaseous distention of the ascending and transverse colon.  Remainder of the colon relatively decompressed.  Circumferential wall thickening involving the rectum, not present on the prior study.  Urinary bladder unremarkable.  Prostate gland seminal vesicles normal for age.  Phleboliths low in both sides of the pelvis.  Bone window images unremarkable apart from minimal degenerative changes involving the lumbar spine.  Small bilateral pleural effusions, right greater than left, and atelectasis in the lower lobes.   Patchy airspace opacities in the right middle lobe.  Heart upper normal in size to perhaps slightly enlarged with left ventricular predominance.  IMPRESSION:  1.  Acute pancreatitis involving the distal body and tail of the pancreas.  Lack of enhancement of a segment of the pancreatic tail may indicate necrosis, though this appearance is unchanged since the examination 3 days ago.  No evidence of pseudocyst formation. 2.  Splenic vein occlusion as noted previously with collaterals in the perigastric region. 3.  Possible secondary inflammation of the gastric fundus and proximal body of the stomach in the vicinity of the acute pancreatitis. 4. Ileus involving the cecum, ascending colon, and transverse colon.  Query acute proctitis, as there is circumferential thickening of the rectum which was not visualized  on the prior examination. 5.  Diffuse hepatic steatosis, less severe than on the prior examination. 6.  Cholelithiasis without CT evidence of acute cholecystitis. 7.  Bilateral pleural effusions, right greater than left, and associated atelectasis in the lower lobes.  Patchy atelectasis or pneumonia in the right middle lobe.   Original Report Authenticated By: Hulan Saas, M.D.    Ct Abdomen Pelvis W Contrast  04/26/2013  *RADIOLOGY REPORT*  Clinical Data: Recurrent pancreatitis  CT ABDOMEN AND PELVIS WITH CONTRAST  Technique:  Multidetector CT imaging of the abdomen and pelvis was performed following the standard protocol during bolus administration of intravenous contrast.  Contrast: 50mL OMNIPAQUE IOHEXOL 300 MG/ML  SOLN, OMNIPAQUE IOHEXOL 300 MG/ML  SOLN  Comparison: CT 11/17/2012  Findings: Mild bilateral effusions and mild basilar atelectasis. No pericardial fluid.  No focal hepatic lesion.  The pancreas head and proximal body are normal.  There is significant inflammation involving the pancreatic distal body and tail.  There are no organized fluid collections but there is significant the  peripancreatic fat inflammation and fluid along the left anterior pararenal space.  There is poor definition of the pancreatic parenchyma in the distal body and tail which could indicate necrosis.  There are several small gallstones in the gallbladder.  The pancreatic duct and common bile duct are prominent but within normal limits.  There is no evidence of vascular complication.  Again no organized fluid collections.  The spleen is normal.  The splenic vein appears patent.  Portal veins are patent.  The adrenal glands are normal.  The kidneys are normal.  The stomach, duodenum, small bowel, and colon unremarkable.  Abdominal aorta normal caliber.  No retroperitoneal periportal lymphadenopathy.  No free fluid the pelvis.  Prostate gland bladder normal.  No pelvic lymphadenopathy.  Fat filled inguinal hernias are noted.  No aggressive osseous lesions.  IMPRESSION:  1.  Acute pancreatitis involving the pancreatic body and tail. There is poor definition of the pancreatic parenchyma in this region which could indicate necrosis. 2.  No organized fluid collection.  Significant peripancreatic fat inflammation and fluid in the left upper quadrant. 3.  No evidence of vascular complication. 4.  Bilateral pleural effusions and mild basilar atelectasis.  5.  Several small gallstones within the gallbladder.  Cannot exclude  gallstone pancreatitis.   Original Report Authenticated By: Genevive Bi, M.D.    Dg Abd Acute W/chest  04/26/2013  *RADIOLOGY REPORT*  Clinical Data: Worsening pancreatitis  and sob.  ACUTE ABDOMEN SERIES (ABDOMEN 2 VIEW & CHEST 1 VIEW)  Comparison: 04/24/2013  Findings: Stable band-like opacities noted at the lung bases, with low lung volumes.  No cardiomegaly observed.  Abnormal dilated loops of bowel noted with air-fluid levels in the mid abdomen.  Mild prominence of gas noted at the splenic flexure. Large central collection of gas in the abdomen seems to low in position for gastric gas on the  upright view, and may represent a distended transverse colon or a markedly distended loop of small bowel.  IMPRESSION:  1.  Scattered abnormal air-fluid levels in mildly dilated small bowel, compatible with ileus. 2.  Abnormal gas filled structure in the central abdomen probably does not represent gas in the stomach, and accordingly probably represents a dilated segment of colon or small bowel.  CT scan of the abdomen with contrast should be considered for further characterization. 3.  Bibasilar atelectasis.   Original Report Authenticated By: Gaylyn Rong, M.D.    Dg Abd Acute W/chest  04/24/2013  *  RADIOLOGY REPORT*  Clinical Data: severe abdominal pain  ACUTE ABDOMEN SERIES (ABDOMEN 2 VIEW & CHEST 1 VIEW)  Comparison: 02/15/2013, 03/14/2011  Findings: Normal heart size and vascularity.  Lungs remain clear. Negative for edema, collapse, consolidation, pneumonia, effusion or pneumothorax.  Trachea is midline.  No free air.  Scattered air and stool throughout the bowel.  No significant dilatation or obstruction pattern.  IMPRESSION: No acute finding by plain radiography   Original Report Authenticated By: Judie Petit. Shick, M.D.        Labs: Basic Metabolic Panel:  Recent Labs Lab 04/27/13 0600 04/28/13 0601 04/29/13 0536 04/30/13 0556 05/01/13 0805  NA 132* 137 135 133* 133*  K 3.3* 3.6 3.2* 3.4* 3.5  CL 96 101 100 99 98  CO2 30 29 23 23 25   GLUCOSE 101* 91 99 98 88  BUN <3* <3* <3* 3* 6  CREATININE 0.69 0.67 0.66 0.73 0.79  CALCIUM 7.4* 7.8* 8.4 8.4 8.6   Liver Function Tests:  Recent Labs Lab 04/26/13 0530 04/28/13 0601 04/29/13 0536 04/30/13 0556 05/01/13 0805  AST 92* 40* 25 17 18   ALT 35 22 17 12 10   ALKPHOS 117 98 92 79 76  BILITOT 1.2 0.8 0.7 0.5 0.4  PROT 6.7 6.2 6.4 6.2 6.3  ALBUMIN 2.7* 2.6* 2.6* 2.5* 2.5*    Recent Labs Lab 04/25/13 0614 04/26/13 0530 04/28/13 0601 04/30/13 0556  LIPASE 80* 70* 35 36    CBC:  Recent Labs Lab 04/27/13 0600 04/28/13 0601  04/29/13 0536 04/30/13 0556 05/01/13 0805  WBC 13.3* 10.1 9.0 8.1 7.6  NEUTROABS 11.2* 8.0*  --   --   --   HGB 12.0* 11.4* 12.1* 11.8* 11.6*  HCT 35.0* 33.5* 34.9* 33.9* 32.6*  MCV 94.1 95.2 93.6 92.9 92.6  PLT 199 245 299 365 469*         Signed:  Jamaree Hosier C  [facility] 05/01/2013, 12:02 PM

## 2013-05-01 NOTE — Progress Notes (Signed)
Pt discharged with instructions, prescriptions, and care notes.  He verbalizes understanding.  The patient left the floor via ambulating  with staff and family in stable condition.

## 2013-05-01 NOTE — Progress Notes (Signed)
Pt stated that he ate his breakfast  With minimal difficulty.  He stated that he took his time and walked when he began to feel bloated.  Will continue to monitor.

## 2013-05-06 ENCOUNTER — Telehealth (HOSPITAL_COMMUNITY): Payer: Self-pay | Admitting: Emergency Medicine

## 2013-05-10 ENCOUNTER — Ambulatory Visit (INDEPENDENT_AMBULATORY_CARE_PROVIDER_SITE_OTHER): Payer: Medicare Other | Admitting: Family Medicine

## 2013-05-10 ENCOUNTER — Encounter: Payer: Self-pay | Admitting: Family Medicine

## 2013-05-10 VITALS — BP 152/84 | HR 104 | Resp 20 | Ht 70.0 in | Wt 189.0 lb

## 2013-05-10 DIAGNOSIS — F1910 Other psychoactive substance abuse, uncomplicated: Secondary | ICD-10-CM

## 2013-05-10 DIAGNOSIS — M5136 Other intervertebral disc degeneration, lumbar region: Secondary | ICD-10-CM

## 2013-05-10 DIAGNOSIS — K852 Alcohol induced acute pancreatitis without necrosis or infection: Secondary | ICD-10-CM

## 2013-05-10 DIAGNOSIS — M51379 Other intervertebral disc degeneration, lumbosacral region without mention of lumbar back pain or lower extremity pain: Secondary | ICD-10-CM

## 2013-05-10 DIAGNOSIS — K859 Acute pancreatitis without necrosis or infection, unspecified: Secondary | ICD-10-CM

## 2013-05-10 DIAGNOSIS — M5137 Other intervertebral disc degeneration, lumbosacral region: Secondary | ICD-10-CM

## 2013-05-10 DIAGNOSIS — I10 Essential (primary) hypertension: Secondary | ICD-10-CM

## 2013-05-10 NOTE — Patient Instructions (Signed)
F/U 3 months [employer]

## 2013-05-11 NOTE — Assessment & Plan Note (Signed)
High risk for recurrence of symptoms

## 2013-05-11 NOTE — Progress Notes (Signed)
  Subjective:    Patient ID: [name] [name], male    DOB: 04/07/1961, 52 y.o.   MRN: [id number]  HPI  Pt here to f/u recent admission for pancreatitis due to alcohol, as well as Cocaine abuse. States he has to control his pain because I am not helping so he uses cocaine and alcohol. Reviewed his medications, has not seen his psychiatrist in some time now. States he hurts all over, in his back especially was working on some cars this past weekend and has been sore since. Wants pain meds  Last use of liquor last night  Review of Systems  GEN- denies fatigue, fever, weight loss,weakness, recent illness HEENT- denies eye drainage, change in vision, nasal discharge, CVS- denies chest pain, palpitations RESP- denies SOB, cough, wheeze ABD- denies N/V, change in stools,+ abd pain GU- denies dysuria, hematuria, dribbling, incontinence MSK- + joint pain, muscle aches, injury Neuro- denies headache, dizziness, syncope, seizure activity      Objective:   Physical Exam GEN- NAD, alert and oriented x3 HEENT- PERRL, EOMI, non injected sclera, pink conjunctiva, MMM, oropharynx clear CVS- tachycardia, no murmur RESP-CTAB ABD-NABS,soft,mild TTP epigastric region, no rebound, no guarding EXT- No edema Pulses- Radial 2+ Psych- not anxious or depressed appearing, appears sleepy       Assessment & Plan:

## 2013-05-11 NOTE — Assessment & Plan Note (Signed)
BP elevated, recent use of substances

## 2013-05-11 NOTE — Assessment & Plan Note (Signed)
Continues to use alcohol and cocaine, he became upset I would not give him narcotic medications Discussed he needs detox in an inpatient facility away from his friends whom he gets high with He left OV at end

## 2013-05-11 NOTE — Assessment & Plan Note (Signed)
He has DDD, but due to above I will not prescribe narcotic medications Pt left visit before it ended, said he will continue using drugs and I cant help him

## 2013-06-25 ENCOUNTER — Inpatient Hospital Stay (HOSPITAL_COMMUNITY)
Admission: EM | Admit: 2013-06-25 | Discharge: 2013-06-29 | DRG: 439 | Disposition: A | Discharge status: Other Acute Inpatient Hospital | Payer: Medicare Other | Attending: Internal Medicine | Admitting: Internal Medicine

## 2013-06-25 ENCOUNTER — Emergency Department (HOSPITAL_COMMUNITY): Payer: Medicare Other

## 2013-06-25 ENCOUNTER — Encounter (HOSPITAL_COMMUNITY): Payer: Self-pay

## 2013-06-25 DIAGNOSIS — F1010 Alcohol abuse, uncomplicated: Secondary | ICD-10-CM

## 2013-06-25 DIAGNOSIS — K5900 Constipation, unspecified: Secondary | ICD-10-CM

## 2013-06-25 DIAGNOSIS — I10 Essential (primary) hypertension: Secondary | ICD-10-CM | POA: Diagnosis present

## 2013-06-25 DIAGNOSIS — E785 Hyperlipidemia, unspecified: Secondary | ICD-10-CM | POA: Diagnosis present

## 2013-06-25 DIAGNOSIS — K863 Pseudocyst of pancreas: Secondary | ICD-10-CM | POA: Diagnosis present

## 2013-06-25 DIAGNOSIS — K862 Cyst of pancreas: Secondary | ICD-10-CM

## 2013-06-25 DIAGNOSIS — K861 Other chronic pancreatitis: Secondary | ICD-10-CM | POA: Diagnosis present

## 2013-06-25 DIAGNOSIS — F17200 Nicotine dependence, unspecified, uncomplicated: Secondary | ICD-10-CM | POA: Diagnosis present

## 2013-06-25 DIAGNOSIS — M545 Low back pain, unspecified: Secondary | ICD-10-CM | POA: Diagnosis present

## 2013-06-25 DIAGNOSIS — K7010 Alcoholic hepatitis without ascites: Secondary | ICD-10-CM | POA: Diagnosis present

## 2013-06-25 DIAGNOSIS — K859 Acute pancreatitis without necrosis or infection, unspecified: Secondary | ICD-10-CM

## 2013-06-25 DIAGNOSIS — F319 Bipolar disorder, unspecified: Secondary | ICD-10-CM | POA: Diagnosis present

## 2013-06-25 DIAGNOSIS — Z9189 Other specified personal risk factors, not elsewhere classified: Secondary | ICD-10-CM | POA: Diagnosis present

## 2013-06-25 DIAGNOSIS — F1910 Other psychoactive substance abuse, uncomplicated: Secondary | ICD-10-CM

## 2013-06-25 DIAGNOSIS — K852 Alcohol induced acute pancreatitis without necrosis or infection: Secondary | ICD-10-CM

## 2013-06-25 DIAGNOSIS — G8929 Other chronic pain: Secondary | ICD-10-CM | POA: Diagnosis present

## 2013-06-25 DIAGNOSIS — R7989 Other specified abnormal findings of blood chemistry: Secondary | ICD-10-CM | POA: Diagnosis present

## 2013-06-25 DIAGNOSIS — R7401 Elevation of levels of liver transaminase levels: Secondary | ICD-10-CM

## 2013-06-25 DIAGNOSIS — R7402 Elevation of levels of lactic acid dehydrogenase (LDH): Secondary | ICD-10-CM | POA: Diagnosis present

## 2013-06-25 DIAGNOSIS — M109 Gout, unspecified: Secondary | ICD-10-CM | POA: Diagnosis present

## 2013-06-25 DIAGNOSIS — E876 Hypokalemia: Secondary | ICD-10-CM | POA: Diagnosis present

## 2013-06-25 DIAGNOSIS — Z833 Family history of diabetes mellitus: Secondary | ICD-10-CM

## 2013-06-25 DIAGNOSIS — M129 Arthropathy, unspecified: Secondary | ICD-10-CM | POA: Diagnosis present

## 2013-06-25 DIAGNOSIS — M542 Cervicalgia: Secondary | ICD-10-CM | POA: Diagnosis present

## 2013-06-25 DIAGNOSIS — Z720 Tobacco use: Secondary | ICD-10-CM | POA: Diagnosis present

## 2013-06-25 DIAGNOSIS — K219 Gastro-esophageal reflux disease without esophagitis: Secondary | ICD-10-CM | POA: Diagnosis present

## 2013-06-25 LAB — URINALYSIS, ROUTINE W REFLEX MICROSCOPIC
Bilirubin Urine: NEGATIVE
Glucose, UA: NEGATIVE mg/dL
Hgb urine dipstick: NEGATIVE
Ketones, ur: NEGATIVE mg/dL
Leukocytes, UA: NEGATIVE
Nitrite: NEGATIVE
Protein, ur: NEGATIVE mg/dL
Specific Gravity, Urine: 1.02 (ref 1.005–1.030)
Urobilinogen, UA: 0.2 mg/dL (ref 0.0–1.0)
pH: 6 (ref 5.0–8.0)

## 2013-06-25 LAB — CBC WITH DIFFERENTIAL/PLATELET
Basophils Absolute: 0 10*3/uL (ref 0.0–0.1)
Basophils Relative: 1 % (ref 0–1)
Eosinophils Absolute: 0.1 10*3/uL (ref 0.0–0.7)
Eosinophils Relative: 2 % (ref 0–5)
HCT: 41.6 % (ref 39.0–52.0)
Hemoglobin: 14.2 g/dL (ref 13.0–17.0)
Lymphocytes Relative: 25 % (ref 12–46)
Lymphs Abs: 1.4 10*3/uL (ref 0.7–4.0)
MCH: 32.5 pg (ref 26.0–34.0)
MCHC: 34.1 g/dL (ref 30.0–36.0)
MCV: 95.2 fL (ref 78.0–100.0)
Monocytes Absolute: 0.5 10*3/uL (ref 0.1–1.0)
Monocytes Relative: 8 % (ref 3–12)
Neutro Abs: 3.6 10*3/uL (ref 1.7–7.7)
Neutrophils Relative %: 64 % (ref 43–77)
Platelets: 414 10*3/uL — ABNORMAL HIGH (ref 150–400)
RBC: 4.37 MIL/uL (ref 4.22–5.81)
RDW: 14.8 % (ref 11.5–15.5)
WBC: 5.6 10*3/uL (ref 4.0–10.5)

## 2013-06-25 LAB — COMPREHENSIVE METABOLIC PANEL
ALT: 13 U/L (ref 0–53)
AST: 70 U/L — ABNORMAL HIGH (ref 0–37)
Albumin: 3.3 g/dL — ABNORMAL LOW (ref 3.5–5.2)
Alkaline Phosphatase: 131 U/L — ABNORMAL HIGH (ref 39–117)
BUN: 3 mg/dL — ABNORMAL LOW (ref 6–23)
CO2: 26 mEq/L (ref 19–32)
Calcium: 9.1 mg/dL (ref 8.4–10.5)
Chloride: 97 mEq/L (ref 96–112)
Creatinine, Ser: 0.74 mg/dL (ref 0.50–1.35)
GFR calc Af Amer: >90 mL/min (ref >90)
GFR calc non Af Amer: >90 mL/min (ref >90)
Glucose, Bld: 184 mg/dL — ABNORMAL HIGH (ref 70–99)
Potassium: 3.4 mEq/L — ABNORMAL LOW (ref 3.5–5.1)
Sodium: 135 mEq/L (ref 135–145)
Total Bilirubin: 0.3 mg/dL (ref 0.3–1.2)
Total Protein: 7.7 g/dL (ref 6.0–8.3)

## 2013-06-25 LAB — LIPASE, BLOOD: Lipase: 41 U/L (ref 11–59)

## 2013-06-25 MED ORDER — THIAMINE HCL 100 MG/ML IJ SOLN: 100.0000 mg | Freq: Every day | INTRAMUSCULAR | Status: DC

## 2013-06-25 MED ORDER — ADULT MULTIVITAMIN W/MINERALS CH
1.0000 | ORAL_TABLET | Freq: Every day | ORAL | Status: DC
  Administered 2013-06-25 – 2013-06-28 (×4): 1 via ORAL
  Filled 2013-06-25 (×4): qty 1

## 2013-06-25 MED ORDER — HYDROMORPHONE HCL PF 1 MG/ML IJ SOLN
1.0000 mg | Freq: Once | INTRAMUSCULAR | Status: AC
  Administered 2013-06-25: 1 mg via INTRAVENOUS
  Filled 2013-06-25: qty 1

## 2013-06-25 MED ORDER — HEPARIN SODIUM (PORCINE) 5000 UNIT/ML IJ SOLN
5000.0000 [IU] | Freq: Three times a day (TID) | INTRAMUSCULAR | Status: DC
  Administered 2013-06-25 – 2013-06-29 (×13): 5000 [IU] via SUBCUTANEOUS
  Filled 2013-06-25 (×13): qty 1

## 2013-06-25 MED ORDER — ONDANSETRON 4 MG PO TBDP
ORAL_TABLET | ORAL | Status: AC
  Administered 2013-06-25: 4 mg via ORAL
  Filled 2013-06-25: qty 1

## 2013-06-25 MED ORDER — IOHEXOL 300 MG/ML  SOLN
50.0000 mL | Freq: Once | INTRAMUSCULAR | Status: AC | PRN
  Administered 2013-06-25: 50 mL via ORAL

## 2013-06-25 MED ORDER — ALBUTEROL SULFATE HFA 108 (90 BASE) MCG/ACT IN AERS: 2.0000 | INHALATION_SPRAY | RESPIRATORY_TRACT | Status: DC | PRN

## 2013-06-25 MED ORDER — PANTOPRAZOLE SODIUM 40 MG PO TBEC
40.0000 mg | DELAYED_RELEASE_TABLET | Freq: Every day | ORAL | Status: DC
  Administered 2013-06-25 – 2013-06-29 (×5): 40 mg via ORAL
  Filled 2013-06-25 (×5): qty 1

## 2013-06-25 MED ORDER — CITALOPRAM HYDROBROMIDE 20 MG PO TABS: 10.0000 mg | ORAL_TABLET | Freq: Every day | ORAL | Status: DC | PRN

## 2013-06-25 MED ORDER — ONDANSETRON HCL 4 MG/2ML IJ SOLN: 4.0000 mg | Freq: Three times a day (TID) | INTRAMUSCULAR | Status: DC | PRN

## 2013-06-25 MED ORDER — SODIUM CHLORIDE 0.9 % IV SOLN
INTRAVENOUS | Status: DC
  Administered 2013-06-25 – 2013-06-27 (×4): via INTRAVENOUS
  Administered 2013-06-28: 100 mL/h via INTRAVENOUS
  Administered 2013-06-28 – 2013-06-29 (×3): via INTRAVENOUS

## 2013-06-25 MED ORDER — LORAZEPAM 2 MG/ML IJ SOLN: 1.0000 mg | Freq: Four times a day (QID) | INTRAMUSCULAR | Status: AC | PRN

## 2013-06-25 MED ORDER — ONDANSETRON HCL 4 MG PO TABS: 4.0000 mg | ORAL_TABLET | Freq: Four times a day (QID) | ORAL | Status: DC | PRN

## 2013-06-25 MED ORDER — ONDANSETRON 4 MG PO TBDP: 4.0000 mg | ORAL_TABLET | Freq: Once | ORAL | Status: AC

## 2013-06-25 MED ORDER — VITAMIN B-1 100 MG PO TABS
100.0000 mg | ORAL_TABLET | Freq: Every day | ORAL | Status: DC
  Administered 2013-06-25 – 2013-06-29 (×5): 100 mg via ORAL
  Filled 2013-06-25 (×5): qty 1

## 2013-06-25 MED ORDER — HYDROMORPHONE HCL PF 1 MG/ML IJ SOLN
1.0000 mg | INTRAMUSCULAR | Status: DC | PRN
  Administered 2013-06-25: 1 mg via INTRAVENOUS
  Filled 2013-06-25: qty 1

## 2013-06-25 MED ORDER — GEMFIBROZIL 600 MG PO TABS
600.0000 mg | ORAL_TABLET | Freq: Two times a day (BID) | ORAL | Status: DC
  Administered 2013-06-25 – 2013-06-29 (×8): 600 mg via ORAL
  Filled 2013-06-25 (×8): qty 1

## 2013-06-25 MED ORDER — OXYCODONE-ACETAMINOPHEN 5-325 MG PO TABS
1.0000 | ORAL_TABLET | Freq: Four times a day (QID) | ORAL | Status: DC | PRN
  Administered 2013-06-25 – 2013-06-27 (×6): 2 via ORAL
  Filled 2013-06-25 (×6): qty 2

## 2013-06-25 MED ORDER — ONDANSETRON HCL 4 MG/2ML IJ SOLN
4.0000 mg | Freq: Once | INTRAMUSCULAR | Status: AC
  Administered 2013-06-25: 4 mg via INTRAVENOUS
  Filled 2013-06-25: qty 2

## 2013-06-25 MED ORDER — SODIUM CHLORIDE 0.9 % IV SOLN
1000.0000 mL | Freq: Once | INTRAVENOUS | Status: AC
  Administered 2013-06-25: 1000 mL via INTRAVENOUS

## 2013-06-25 MED ORDER — LORAZEPAM 1 MG PO TABS: 1.0000 mg | ORAL_TABLET | Freq: Four times a day (QID) | ORAL | Status: AC | PRN

## 2013-06-25 MED ORDER — MORPHINE SULFATE 2 MG/ML IJ SOLN
2.0000 mg | INTRAMUSCULAR | Status: DC | PRN
  Administered 2013-06-25 – 2013-06-26 (×7): 2 mg via INTRAVENOUS
  Filled 2013-06-25 (×7): qty 1

## 2013-06-25 MED ORDER — TRAZODONE HCL 50 MG PO TABS
100.0000 mg | ORAL_TABLET | Freq: Every evening | ORAL | Status: DC | PRN
  Administered 2013-06-25 – 2013-06-28 (×4): 100 mg via ORAL
  Filled 2013-06-25 (×4): qty 2

## 2013-06-25 MED ORDER — IOHEXOL 300 MG/ML  SOLN
100.0000 mL | Freq: Once | INTRAMUSCULAR | Status: AC | PRN
  Administered 2013-06-25: 100 mL via INTRAVENOUS

## 2013-06-25 MED ORDER — SODIUM CHLORIDE 0.9 % IV SOLN
1000.0000 mL | INTRAVENOUS | Status: DC
  Administered 2013-06-25: 1000 mL via INTRAVENOUS

## 2013-06-25 MED ORDER — ONDANSETRON HCL 4 MG/2ML IJ SOLN: 4.0000 mg | Freq: Four times a day (QID) | INTRAMUSCULAR | Status: DC | PRN

## 2013-06-25 NOTE — ED Notes (Signed)
Called report to [place], RN on unit 300.

## 2013-06-25 NOTE — Consult Note (Signed)
. Referring Provider: No ref. provider found Primary Care Physician:  Milinda Antis, MD Primary Gastroenterologist:  Dr. Darrick Penna.  Reason for Consultation:  Complicated pancreatitis.  HPI: Patient is 52 year old African male who was admitted to hospitalist service earlier today via emergency room for management of recurrent pancreatitis. Patient first experienced episode of alcoholic pancreatitis in March 2012 and he has had 2 episodes since then. He was admitted to this facility on 04/24/2013 and discharged on 05/01/2013. He states he did well for 2 weeks but then he started drinking beer and whiskey and is being relapse and has been getting worse. He also has developed nausea and unable to eat. He has lost 8-10 pounds in the last few weeks. He has experienced diaphoreses but denies fever or chills. Pain is located across upper abdomen more on the left side and radiates posteriorly. It is intense and constant and made worse when he coughs. He denies shortness of breath he also denies melena or rectal bleeding. He had normal bowel movement yesterday. He states he's been drinking beer and/or risky for several years. Last drink was yesterday. He smokes half to one pack of cigarettes per day. He is disabled on account of chronic low back pain. He did Holiday representative work for 22 years. Family history is negative for pancreatitis. Family history significant for diabetes and father and one sister. Mother is in good health. He has 2 brothers and 5 sisters all together. He has never married. He has one son age 59.   Past Medical History  Diagnosis Date  . Bronchitis   . Acid reflux   . Pancreatitis     March 2013  . Gout   . Bipolar disorder   . Hyperlipidemia   . Alcohol abuse     6-8 cans of beer daily  . Pseudocyst of pancreas 02/28/2012  . Arthritis   . Fracture of lower leg   . Hypertension   . Chronic pain   . Chronic neck pain   . Chronic back pain   . Left arm pain     chronic     Past Surgical History  Procedure Laterality Date  . Nose surgery      broken nose  . Circumcision  03/17/2012    Procedure: CIRCUMCISION ADULT;  Surgeon: Ky Barban, MD;  Location: [facility];  Service: Urology;  Laterality: N/A;  . Colonoscopy with propofol  12/24/2012    Procedure: COLONOSCOPY WITH PROPOFOL;  Surgeon: Corbin Ade, MD;  Location: [facility];  Service: Endoscopy;  Laterality: N/A;  started at 0803, in cecum at 0812, withdrawel time Biopsy of anal lesion  . Esophagogastroduodenoscopy (egd) with propofol  12/24/2012    Procedure: ESOPHAGOGASTRODUODENOSCOPY (EGD) WITH PROPOFOL;  Surgeon: Corbin Ade, MD;  Location: [facility];  Service: Endoscopy;  Laterality: N/A;  done at 0800    Prior to Admission medications   Medication Sig Start Date End Date Taking? Authorizing Provider  albuterol (PROVENTIL HFA;VENTOLIN HFA) 108 (90 BASE) MCG/ACT inhaler Inhale 2 puffs into the lungs every 4 (four) hours as needed for wheezing. For asthma/wheezing 03/19/13  Yes Sanjuana Kava, NP  amLODipine (NORVASC) 5 MG tablet Take 1 tablet (5 mg total) by mouth daily. For high blood pressure control 03/19/13  Yes Sanjuana Kava, NP  citalopram (CELEXA) 10 MG tablet Take 10 mg by mouth daily as needed (Anxiety).    Yes Historical Provider, MD  fish oil-omega-3 fatty acids 1000 MG capsule Take 1 capsule (1 g  total) by mouth daily. For cholesterol control 03/19/13  Yes Sanjuana Kava, NP  gemfibrozil (LOPID) 600 MG tablet Take 1 tablet (600 mg total) by mouth 2 (two) times daily before a meal. Cholesterol control 03/19/13  Yes Sanjuana Kava, NP  Multiple Vitamin (MULTIVITAMIN WITH MINERALS) TABS Take 1 tablet by mouth daily.   Yes Historical Provider, MD  omeprazole (PRILOSEC) 20 MG capsule Take 1 capsule (20 mg total) by mouth daily. For acid reflux 03/19/13  Yes Sanjuana Kava, NP  prochlorperazine (COMPAZINE) 10 MG tablet Take 1 tablet by mouth daily as needed (nausea).  04/02/13  Yes Historical  Provider, MD  tamsulosin (FLOMAX) 0.4 MG CAPS Take 1 capsule (0.4 mg total) by mouth daily. For enlarged prostate 03/19/13  Yes Sanjuana Kava, NP  thiamine 100 MG tablet Take 1 tablet (100 mg total) by mouth daily. 05/01/13  Yes Nimish Normajean Glasgow, MD  traZODone (DESYREL) 100 MG tablet Take 1 tablet by mouth at bedtime as needed for sleep.  01/21/13  Yes Historical Provider, MD    Current Facility-Administered Medications  Medication Dose Route Frequency Provider Last Rate Last Dose  . 0.9 %  sodium chloride infusion  1,000 mL Intravenous Continuous Lyanne Co, MD   1,000 mL at 06/25/13 [phone]  . 0.9 %  sodium chloride infusion   Intravenous Continuous Pamella Pert, MD 100 mL/hr at 06/25/13 1115    . albuterol (PROVENTIL HFA;VENTOLIN HFA) 108 (90 BASE) MCG/ACT inhaler 2 puff  2 puff Inhalation Q4H PRN Pamella Pert, MD      . citalopram (CELEXA) tablet 10 mg  10 mg Oral Daily PRN Pamella Pert, MD      . gemfibrozil (LOPID) tablet 600 mg  600 mg Oral BID AC Costin Gherghe, MD   600 mg at 06/25/13 1720  . heparin injection 5,000 Units  5,000 Units Subcutaneous Q8H Pamella Pert, MD   5,000 Units at 06/25/13 1549  . LORazepam (ATIVAN) tablet 1 mg  1 mg Oral Q6H PRN Pamella Pert, MD       Or  . LORazepam (ATIVAN) injection 1 mg  1 mg Intravenous Q6H PRN Pamella Pert, MD      . morphine 2 MG/ML injection 2 mg  2 mg Intravenous Q3H PRN Pamella Pert, MD   2 mg at 06/25/13 1631  . multivitamin with minerals tablet 1 tablet  1 tablet Oral Daily Pamella Pert, MD   1 tablet at 06/25/13 1345  . ondansetron (ZOFRAN) tablet 4 mg  4 mg Oral Q6H PRN Pamella Pert, MD       Or  . ondansetron (ZOFRAN) injection 4 mg  4 mg Intravenous Q6H PRN Pamella Pert, MD      . oxyCODONE-acetaminophen (PERCOCET/ROXICET) 5-325 MG per tablet 1-2 tablet  1-2 tablet Oral Q6H PRN Pamella Pert, MD   2 tablet at 06/25/13 1814  . pantoprazole (PROTONIX) EC tablet 40 mg  40 mg Oral Daily Pamella Pert, MD   40 mg at  06/25/13 1345  . thiamine (VITAMIN B-1) tablet 100 mg  100 mg Oral Daily Costin Gherghe, MD   100 mg at 06/25/13 1344   Or  . thiamine (B-1) injection 100 mg  100 mg Intravenous Daily Pamella Pert, MD      . traZODone (DESYREL) tablet 100 mg  100 mg Oral QHS PRN Pamella Pert, MD        Allergies as of 06/25/2013 - Review Complete 06/25/2013  Allergen Reaction Noted  . Penicillins Itching  08/05/2011    Family History  Problem Relation Age of Onset  . Diabetes Father   . Anesthesia problems Neg Hx   . Hypotension Neg Hx   . Malignant hyperthermia Neg Hx   . Pseudochol deficiency Neg Hx   . Colon cancer Neg Hx     History   Social History  . Marital Status: Single    Spouse Name: N/A    Number of Children: N/A  . Years of Education: N/A   Occupational History  . unemployed    Social History Main Topics  . Smoking status: Current Every Day Smoker -- 0.50 packs/day for 25 years    Types: Cigarettes  . Smokeless tobacco: Not on file  . Alcohol Use: 0.0 oz/week     Comment: 6-8 beers a day  . Drug Use: Yes    Special: Cocaine, Marijuana     Comment: hx of marijuana and cocaine use last used cocaine 1 week ago  . Sexually Active: Not Currently   Other Topics Concern  . Not on file   Social History Narrative  . No narrative on file    Review of Systems: See HPI, otherwise normal ROS  Physical Exam: Temp:  [98.8 F (37.1 C)] 98.8 F (37.1 C) (07/04 0734) Pulse Rate:  [90-108] 90 (07/04 1153) Resp:  [17-18] 18 (07/04 1153) BP: (115-127)/(80-85) 115/80 mmHg (07/04 1153) SpO2:  [95 %-100 %] 97 % (07/04 1153) Weight:  [185 lb (83.915 kg)] 185 lb (83.915 kg) (07/04 0734) Last BM Date: 06/24/13  Pleasant well-developed well-nourished African American male was in no acute distress. Conjunctiva is pink. Sclerae nonicteric. Oropharyngeal mucosa is normal. He has few teeth remaining and lower jaw. No neck masses or thyromegaly noted. Cardiac exam with a regular  rhythm normal S1 and S2. No murmur or gallop noted. Lungs clear to auscultation. Abdomen is full cross upper half. Bowel sounds are normal. Abdomen is soft with mild tenderness across lower half and moderate tenderness across upper half with guarding in epigastric region. Liver edge is indistinct. No masses noted. He also has tenderness at left renal angle. No peripheral edema or clubbing noted.    Lab Results:  Recent Labs  06/25/13 0738  WBC 5.6  HGB 14.2  HCT 41.6  PLT 414*   BMET  Recent Labs  06/25/13 0738  NA 135  K 3.4*  CL 97  CO2 26  GLUCOSE 184*  BUN 3*  CREATININE 0.74  CALCIUM 9.1   LFT  Recent Labs  06/25/13 0738  PROT 7.7  ALBUMIN 3.3*  AST 70*  ALT 13  ALKPHOS 131*  BILITOT 0.3   PT/INR No results found for this basename: LABPROT, INR,  in the last 72 hours Hepatitis Panel No results found for this basename: HEPBSAG, HCVAB, HEPAIGM, HEPBIGM,  in the last 72 hours  Studies/Results: Ct Abdomen Pelvis W Contrast  06/25/2013   *RADIOLOGY REPORT*  Clinical Data: Epigastric pain, history of pancreatitis  CT ABDOMEN AND PELVIS WITH CONTRAST  Technique:  Multidetector CT imaging of the abdomen and pelvis was performed following the standard protocol during bolus administration of intravenous contrast.  Contrast: 50mL OMNIPAQUE IOHEXOL 300 MG/ML  SOLN, OMNIPAQUE IOHEXOL 300 MG/ML  SOLN  Comparison: Most recent prior CT abdomen/pelvis 04/29/2013  Findings:  Lower Chest:  Mild dependent atelectasis in the bilateral lower lobes.  The lung bases are otherwise clear.  Visualized heart within normal limits for size.  No pericardial effusion. Unremarkable distal thoracic esophagus.  Abdomen: Interval development of several circumscribed fluid collections in the previously identified region of inflammation in the pancreatic tail.  One fluid collections just superior to the right kidney measures 3.6 x 2.1 x 1.9 cm in transverse dimension. A second fluid collection  in the lateral perinephric space measures 2.6 x 2.6 x 3.8 cm. No evidence of gas within the fluid collections.  The fluid collections are loculated with thin peripheral enhancement.  There are persistent changes of inflammatory stranding in the surrounding peripancreatic and perinephric fat.  Secondary thickening of the posterior gastric wall, left pararenal and lateral conal fascia and splenic flexure of the colon. Persistent occlusion of the splenic vein with multiple splenic collaterals.  The splenic artery is not well seen given the portal venous phase timing of the study.  No definite pseudoaneurysm or hematoma identified.  No significant inflammation of the pancreatic neck, head or uncinate process.  Diffusely low attenuation of the hepatic parenchyma consistent with hepatic steatosis.  Small gallstones layer within the gallbladder lumen.  The gallbladder is minimally distended there is no significant gallbladder wall thickening or pericholecystic fluid by CT scan.  No intra or extrahepatic biliary ductal dilatation.  The adrenal glands are within normal limits.  Symmetric parenchymal enhancement the bilateral kidneys.  No hydronephrosis or nephrolithiasis.  There is a small 1.7 x 0.9 mm developing subcapsular fluid collection along the lateral aspect of the left kidney in close approximation with the other perinephric fluid collections.  Normal-caliber large and small bowel.  No evidence of bowel obstruction.  As mentioned above, there is secondary reactive thickening of the colon in the region of the splenic flexure. Interval resolution of circumferential thickening of the distal rectum.  Decreased colonic ileus with improved dilatation of the cecum and ascending colon.  Pelvis: Small amount of free fluid in the rectovesicular recess which has decreased compared to prior.  The bladder appears unremarkable for the level of distension.  Unremarkable prostate gland and seminal vesicles.  Bones: No acute  fracture or aggressive appearing lytic or blastic osseous lesion.  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  Splenic artery not well seen.  No definite pseudoaneurysm.  IMPRESSION:  1.  Continued evolution of pancreatitis involving the pancreatic tail and distal body with interval development of loculated fluid collections within the pancreatic tail and left perinephric space. The fluid collections have thin peripheral enhancement but no internal gas.  Primary differential consideration is multi focal pancreatic pseudocyst.  The sterility these collections cannot be confirmed radiographically.  There is associated secondary inflammatory thickening the posterior gastric body, splenic flexure of the colon, anterior pararenal fascia and left lateral coronal fascia.  2.  Small developing subcapsular fluid collection along the lateral aspect of the left kidney likely reflecting a small satellite pancreatic pseudocyst.  Again, sterility of the fluid cannot be assessed radiographically.  3.  Stable appearance of chronic occlusion of the splenic vein with associated collateral formation.  4.  Cholelithiasis without cholecystitis by CT.  5.  Hepatic steatosis.  6. Compared to prior, interval resolution of pleural effusions, clearing of the lung bases, resolution of rectal wall thickening and cecal and ascending colonic ileus.  7.  Additional ancillary findings as above without significant interval change.   Original Report Authenticated By: Malachy Moan, M.D.   I've also reviewed CT from 04/26/2013 and 04/29/2013 and a CT from 03/14/2011.  Assessment; Patient has recurrent alcoholic pancreatitis complicated by at least 2 pseudocysts and he has extensive inflammatory  changes surrounding the left kidney. Patient does not appear to be toxic and I doubt that he has infected cyst. He also has cholelithiasis which would appear to be asymptomatic. Patient will need to be n.p.o. until  inflammatory changes have resolved. He could either be fed via nasojejunal feeding tube or TPN. He declines to have tube placed for feeding. Therefore will proceed with TPN. He also has mild alcoholic hepatitis.  Recommendations; Continue n.p.o. status except for by mouth medications. TPN will be started when PICC line placed which hopefully would be tomorrow. Will check hepatitis B surface antigen and HCV antibody. Will also check serum lipase in the a.m lab. Patient will be reevaluated in a.m. Patient informed that that he must quit drinking alcohol altogether otherwise he would have worsening sequelae of pancreatitis which could be catastrophic.   LOS: 0 days   Ervin Rothbauer U  06/25/2013, 6:50 PM

## 2013-06-25 NOTE — ED Notes (Signed)
Pt reports severe ab pain and nausea from his "pancreas", admits to drinking heavily everyday-6 pack to 1/2 a pint. Has not been to his pmd, is out of his meds for pain, normally takes oxycodone, but is out--"for a while"

## 2013-06-25 NOTE — H&P (Signed)
[facility] History and Physical  [name] [name] NWG:[id number] DOB: 04/26/1961 DOA: 06/25/2013  Referring physician: Dr. Patria Mane PCP: Milinda Antis, MD  Specialists: GI  Chief Complaint: abdominal pain  HPI: [name] [name] is a 52 y.o. male has a past medical history significant for polysubstance abuse, including alcohol, hypertension, recent history of pancreatitis for which he was hospitalized here at the beginning of May, presents to the emergency room with a chief complaint of severe epigastric abdominal pain radiating into his back and left flank. He tells me today that after his discharge, he continued to be drinking at home, and has his birthday was just 2 weeks ago he is being continued to have heavy drinking. He states that he is able to eat, with minimal nausea no vomiting. Last meal was last night. Last alcoholic drink was yesterday. He denies any frank fevers or chills, however felt "cold" yesterday. Denies any diarrhea or constipation. He has no chest pain or breathing difficulties. He denies any headaches, lightheadedness or dizziness. In the emergency room, blood work shows normal lipase, minimally elevated liver function tests in a pattern consistent with alcohol consumption. CT scan of the abdomen showed interval development of loculated fluid collections within the pancreatic tail and left perinephric space.  Review of Systems: As per history of present illness, otherwise negative  Past Medical History  Diagnosis Date  . Bronchitis   . Acid reflux   . Pancreatitis     March 2013  . Gout   . Bipolar disorder   . Hyperlipidemia   . Alcohol abuse     6-8 cans of beer daily  . Pseudocyst of pancreas 02/28/2012  . Arthritis   . Fracture of lower leg   . Hypertension   . Chronic pain   . Chronic neck pain   . Chronic back pain   . Left arm pain     chronic   Past Surgical History  Procedure Laterality Date  . Nose surgery      broken nose  . Circumcision  03/17/2012     Procedure: CIRCUMCISION ADULT;  Surgeon: Ky Barban, MD;  Location: [facility];  Service: Urology;  Laterality: N/A;  . Colonoscopy with propofol  12/24/2012    Procedure: COLONOSCOPY WITH PROPOFOL;  Surgeon: Corbin Ade, MD;  Location: [facility];  Service: Endoscopy;  Laterality: N/A;  started at 0803, in cecum at 0812, withdrawel time Biopsy of anal lesion  . Esophagogastroduodenoscopy (egd) with propofol  12/24/2012    Procedure: ESOPHAGOGASTRODUODENOSCOPY (EGD) WITH PROPOFOL;  Surgeon: Corbin Ade, MD;  Location: [facility];  Service: Endoscopy;  Laterality: N/A;  done at 0800   Social History:  reports that he has been smoking Cigarettes.  He has a 12.5 pack-year smoking history. He does not have any smokeless tobacco history on file. He reports that  drinks alcohol. He reports that he uses illicit drugs (Cocaine and Marijuana).  Allergies  Allergen Reactions  . Penicillins Itching    Family History  Problem Relation Age of Onset  . Diabetes Father   . Anesthesia problems Neg Hx   . Hypotension Neg Hx   . Malignant hyperthermia Neg Hx   . Pseudochol deficiency Neg Hx   . Colon cancer Neg Hx    Prior to Admission medications   Medication Sig Start Date End Date Taking? Authorizing Provider  albuterol (PROVENTIL HFA;VENTOLIN HFA) 108 (90 BASE) MCG/ACT inhaler Inhale 2 puffs into the lungs every 4 (four) hours as needed for wheezing.  For asthma/wheezing 03/19/13  Yes Sanjuana Kava, NP  amLODipine (NORVASC) 5 MG tablet Take 1 tablet (5 mg total) by mouth daily. For high blood pressure control 03/19/13  Yes Sanjuana Kava, NP  citalopram (CELEXA) 10 MG tablet Take 10 mg by mouth daily as needed (Anxiety).    Yes Historical Provider, MD  fish oil-omega-3 fatty acids 1000 MG capsule Take 1 capsule (1 g total) by mouth daily. For cholesterol control 03/19/13  Yes Sanjuana Kava, NP  gemfibrozil (LOPID) 600 MG tablet Take 1 tablet (600 mg total) by mouth 2 (two) times daily before a meal.  Cholesterol control 03/19/13  Yes Sanjuana Kava, NP  Multiple Vitamin (MULTIVITAMIN WITH MINERALS) TABS Take 1 tablet by mouth daily.   Yes Historical Provider, MD  omeprazole (PRILOSEC) 20 MG capsule Take 1 capsule (20 mg total) by mouth daily. For acid reflux 03/19/13  Yes Sanjuana Kava, NP  prochlorperazine (COMPAZINE) 10 MG tablet Take 1 tablet by mouth daily as needed (nausea).  04/02/13  Yes Historical Provider, MD  tamsulosin (FLOMAX) 0.4 MG CAPS Take 1 capsule (0.4 mg total) by mouth daily. For enlarged prostate 03/19/13  Yes Sanjuana Kava, NP  thiamine 100 MG tablet Take 1 tablet (100 mg total) by mouth daily. 05/01/13  Yes Nimish Normajean Glasgow, MD  traZODone (DESYREL) 100 MG tablet Take 1 tablet by mouth at bedtime as needed for sleep.  01/21/13  Yes Historical Provider, MD   Physical Exam: Filed Vitals:   06/25/13 0734  BP: 127/84  Pulse: 108  Temp: 98.8 F (37.1 C)  TempSrc: Oral  Resp: 17  Height: 5\' 9"  (1.753 m)  Weight: 83.915 kg (185 lb)  SpO2: 100%     General:  No apparent distress  Eyes: PERRL, EOMI, no scleral icterus  ENT: moist oropharynx  Neck: supple, no JVD  Cardiovascular: regular rate without MRG; 2+ peripheral pulses  Respiratory: CTA biL, good air movement without wheezing, rhonchi or crackled  Abdomen: soft, tender to palpation epigastric area, left quadrants, left flank tender to palpation  Skin: no rashes  Musculoskeletal: no peripheral edema  Psychiatric: normal mood and affect  Neurologic: CN 2-12 grossly intact, MS 5/5 in all 4  Labs on Admission:  Basic Metabolic Panel:  Recent Labs Lab 06/25/13 0738  NA 135  K 3.4*  CL 97  CO2 26  GLUCOSE 184*  BUN 3*  CREATININE 0.74  CALCIUM 9.1   Liver Function Tests:  Recent Labs Lab 06/25/13 0738  AST 70*  ALT 13  ALKPHOS 131*  BILITOT 0.3  PROT 7.7  ALBUMIN 3.3*    Recent Labs Lab 06/25/13 0738  LIPASE 41   CBC:  Recent Labs Lab 06/25/13 0738  WBC 5.6  NEUTROABS 3.6   HGB 14.2  HCT 41.6  MCV 95.2  PLT 414*   Radiological Exams on Admission: Ct Abdomen Pelvis W Contrast  06/25/2013   *RADIOLOGY REPORT*  Clinical Data: Epigastric pain, history of pancreatitis  CT ABDOMEN AND PELVIS WITH CONTRAST  Technique:  Multidetector CT imaging of the abdomen and pelvis was performed following the standard protocol during bolus administration of intravenous contrast.  Contrast: 50mL OMNIPAQUE IOHEXOL 300 MG/ML  SOLN, OMNIPAQUE IOHEXOL 300 MG/ML  SOLN  Comparison: Most recent prior CT abdomen/pelvis 04/29/2013  Findings:  Lower Chest:  Mild dependent atelectasis in the bilateral lower lobes.  The lung bases are otherwise clear.  Visualized heart within normal limits for size.  No pericardial effusion.  Unremarkable distal thoracic esophagus.  Abdomen: Interval development of several circumscribed fluid collections in the previously identified region of inflammation in the pancreatic tail.  One fluid collections just superior to the right kidney measures 3.6 x 2.1 x 1.9 cm in transverse dimension. A second fluid collection in the lateral perinephric space measures 2.6 x 2.6 x 3.8 cm. No evidence of gas within the fluid collections.  The fluid collections are loculated with thin peripheral enhancement.  There are persistent changes of inflammatory stranding in the surrounding peripancreatic and perinephric fat.  Secondary thickening of the posterior gastric wall, left pararenal and lateral conal fascia and splenic flexure of the colon. Persistent occlusion of the splenic vein with multiple splenic collaterals.  The splenic artery is not well seen given the portal venous phase timing of the study.  No definite pseudoaneurysm or hematoma identified.  No significant inflammation of the pancreatic neck, head or uncinate process.  Diffusely low attenuation of the hepatic parenchyma consistent with hepatic steatosis.  Small gallstones layer within the gallbladder lumen.  The gallbladder is  minimally distended there is no significant gallbladder wall thickening or pericholecystic fluid by CT scan.  No intra or extrahepatic biliary ductal dilatation.  The adrenal glands are within normal limits.  Symmetric parenchymal enhancement the bilateral kidneys.  No hydronephrosis or nephrolithiasis.  There is a small 1.7 x 0.9 mm developing subcapsular fluid collection along the lateral aspect of the left kidney in close approximation with the other perinephric fluid collections.  Normal-caliber large and small bowel.  No evidence of bowel obstruction.  As mentioned above, there is secondary reactive thickening of the colon in the region of the splenic flexure. Interval resolution of circumferential thickening of the distal rectum.  Decreased colonic ileus with improved dilatation of the cecum and ascending colon.  Pelvis: Small amount of free fluid in the rectovesicular recess which has decreased compared to prior.  The bladder appears unremarkable for the level of distension.  Unremarkable prostate gland and seminal vesicles.  Bones: No acute fracture or aggressive appearing lytic or blastic osseous lesion.  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  Splenic artery not well seen.  No definite pseudoaneurysm.  IMPRESSION:  1.  Continued evolution of pancreatitis involving the pancreatic tail and distal body with interval development of loculated fluid collections within the pancreatic tail and left perinephric space. The fluid collections have thin peripheral enhancement but no internal gas.  Primary differential consideration is multi focal pancreatic pseudocyst.  The sterility these collections cannot be confirmed radiographically.  There is associated secondary inflammatory thickening the posterior gastric body, splenic flexure of the colon, anterior pararenal fascia and left lateral coronal fascia.  2.  Small developing subcapsular fluid collection along the lateral aspect  of the left kidney likely reflecting a small satellite pancreatic pseudocyst.  Again, sterility of the fluid cannot be assessed radiographically.  3.  Stable appearance of chronic occlusion of the splenic vein with associated collateral formation.  4.  Cholelithiasis without cholecystitis by CT.  5.  Hepatic steatosis.  6. Compared to prior, interval resolution of pleural effusions, clearing of the lung bases, resolution of rectal wall thickening and cecal and ascending colonic ileus.  7.  Additional ancillary findings as above without significant interval change.   Original Report Authenticated By: Malachy Moan, M.D.    EKG: Independently reviewed.  Assessment/Plan Active Problems:   Alcohol abuse   Pseudocyst of pancreas   Tobacco user   Transaminitis  Polysubstance abuse   Acute alcoholic pancreatitis  Abdominal pain - due to new findings on the CT scan; acute on chronic pancreatitis complicated by new pseudocysts - N.p.o., IV fluids, pain control - We'll consult gastroenterology to help with further management - consider IR drainage but clinically the collections do not appear infectious as patient is without leukocytosis or febrile.  ETOH abuse - CIWA - wishes to go to rehab on discharge  Elevated LFTs - due to #2  DVT Prophylaxis - heparin s.q.  Code Status: presumed full  Family Communication: none  Disposition Plan: inpatient  Time spent: 62  Costin M. Elvera Lennox, MD [facility] Pager [phone]  If 7PM-7AM, please contact night-coverage [email or web address] Password [email or web address] 06/25/2013, 11:11 AM

## 2013-06-25 NOTE — ED Provider Notes (Signed)
History  This chart was scribed for Lyanne Co, MD by Bennett Scrape, ED Scribe. This patient was seen in room [facility] and the patient's care was started at 7:27 AM.  CSN: [id number]  Arrival date & time 06/25/13  0724   First MD Initiated Contact with Patient 06/25/13 [phone]     Chief Complaint  Patient presents with  . Pancreatitis    The history is provided by the patient. No language interpreter was used.    HPI Comments: [name] [name] is a 52 y.o. male who presents to the Emergency Department complaining of 2 days of diffuse abdominal pain rated as severe with associated nausea. He admits that the pain is similar to prior episodes attributed to pancreatitis. Wife states that the pt has tried Robaxin and percocet for his pain with mild improvement. Last admission for pancreatitis was in April 2014. He admits that he has been drinking EtOH heavily, last drink was yesterday. He states that he would like rehab but wants his pain to be treated first. He denies emesis, fevers, chills and diarrhea as associated symptoms.   Past Medical History  Diagnosis Date  . Bronchitis   . Acid reflux   . Pancreatitis     March 2013  . Gout   . Bipolar disorder   . Hyperlipidemia   . Alcohol abuse     6-8 cans of beer daily  . Pseudocyst of pancreas 02/28/2012  . Arthritis   . Fracture of lower leg   . Hypertension   . Chronic pain   . Chronic neck pain   . Chronic back pain   . Left arm pain     chronic   Past Surgical History  Procedure Laterality Date  . Nose surgery      broken nose  . Circumcision  03/17/2012    Procedure: CIRCUMCISION ADULT;  Surgeon: Ky Barban, MD;  Location: [facility];  Service: Urology;  Laterality: N/A;  . Colonoscopy with propofol  12/24/2012    Procedure: COLONOSCOPY WITH PROPOFOL;  Surgeon: Corbin Ade, MD;  Location: [facility];  Service: Endoscopy;  Laterality: N/A;  started at 0803, in cecum at 0812, withdrawel time Biopsy of anal lesion   . Esophagogastroduodenoscopy (egd) with propofol  12/24/2012    Procedure: ESOPHAGOGASTRODUODENOSCOPY (EGD) WITH PROPOFOL;  Surgeon: Corbin Ade, MD;  Location: [facility];  Service: Endoscopy;  Laterality: N/A;  done at 0800   Family History  Problem Relation Age of Onset  . Diabetes Father   . Anesthesia problems Neg Hx   . Hypotension Neg Hx   . Malignant hyperthermia Neg Hx   . Pseudochol deficiency Neg Hx   . Colon cancer Neg Hx    History  Substance Use Topics  . Smoking status: Current Every Day Smoker -- 0.50 packs/day for 25 years    Types: Cigarettes  . Smokeless tobacco: Not on file  . Alcohol Use: 0.0 oz/week     Comment: 6-8 beers a day    Review of Systems  A complete 10 system review of systems was obtained and all systems are negative except as noted in the HPI and PMH.   Allergies  Penicillins  Home Medications   Current Outpatient Rx  Name  Route  Sig  Dispense  Refill  . albuterol (PROVENTIL HFA;VENTOLIN HFA) 108 (90 BASE) MCG/ACT inhaler   Inhalation   Inhale 2 puffs into the lungs every 4 (four) hours as needed for wheezing. For asthma/wheezing  1 Inhaler   2   . amLODipine (NORVASC) 5 MG tablet   Oral   Take 1 tablet (5 mg total) by mouth daily. For high blood pressure control   30 tablet   0   . citalopram (CELEXA) 10 MG tablet   Oral   Take 10 mg by mouth daily.         . fish oil-omega-3 fatty acids 1000 MG capsule   Oral   Take 1 capsule (1 g total) by mouth daily. For cholesterol control         . gemfibrozil (LOPID) 600 MG tablet   Oral   Take 1 tablet (600 mg total) by mouth 2 (two) times daily before a meal. Cholesterol control   60 tablet   3   . lipase/protease/amylase (CREON-12/PANCREASE) 12000 UNITS CPEP   Oral   Take 4 capsules by mouth 3 (three) times daily before meals.   270 capsule   0   . omeprazole (PRILOSEC) 20 MG capsule   Oral   Take 1 capsule (20 mg total) by mouth daily. For acid reflux   30 capsule    3   . prochlorperazine (COMPAZINE) 10 MG tablet   Oral   Take 1 tablet by mouth daily as needed (nausea).          . senna-docusate (SENOKOT-S) 8.6-50 MG per tablet   Oral   Take 2 tablets by mouth daily.   30 tablet   0   . tamsulosin (FLOMAX) 0.4 MG CAPS   Oral   Take 1 capsule (0.4 mg total) by mouth daily. For enlarged prostate   30 capsule   6   . thiamine 100 MG tablet   Oral   Take 1 tablet (100 mg total) by mouth daily.   30 tablet   0   . traZODone (DESYREL) 100 MG tablet   Oral   Take 1 tablet by mouth at bedtime as needed for sleep.           Triage Vitals: BP 127/84  Pulse 108  Temp(Src) 98.8 F (37.1 C) (Oral)  Resp 17  Ht 5\' 9"  (1.753 m)  Wt 185 lb (83.915 kg)  BMI 27.31 kg/m2  SpO2 100%  Physical Exam  Nursing note and vitals reviewed. Constitutional: He is oriented to person, place, and time. He appears well-developed and well-nourished.  HENT:  Head: Normocephalic and atraumatic.  Eyes: EOM are normal.  Neck: Normal range of motion.  Cardiovascular: Normal rate, regular rhythm, normal heart sounds and intact distal pulses.   Pulmonary/Chest: Effort normal and breath sounds normal. No respiratory distress.  Abdominal: Soft. He exhibits no distension. There is tenderness.  Generalized abdominal tenderness, more localized to the epigastric region  Genitourinary: Rectum normal.  Musculoskeletal: Normal range of motion.  Neurological: He is alert and oriented to person, place, and time.  Skin: Skin is warm and dry.  Psychiatric: He has a normal mood and affect. Judgment normal.    ED Course  Procedures (including critical care time)  Medications  0.9 %  sodium chloride infusion (0 mLs Intravenous Stopped 06/25/13 0833)    Followed by  0.9 %  sodium chloride infusion (1,000 mLs Intravenous New Bag/Given 06/25/13 0833)  ondansetron (ZOFRAN-ODT) disintegrating tablet 4 mg (4 mg Oral Given 06/25/13 0739)  HYDROmorphone (DILAUDID) injection 1 mg  (1 mg Intravenous Given 06/25/13 0756)  ondansetron (ZOFRAN) injection 4 mg (4 mg Intravenous Given 06/25/13 0801)  HYDROmorphone (DILAUDID) injection 1 mg (1  mg Intravenous Given 06/25/13 0906)  iohexol (OMNIPAQUE) 300 MG/ML solution 50 mL (50 mLs Oral Contrast Given 06/25/13 0905)  iohexol (OMNIPAQUE) 300 MG/ML solution 100 mL (100 mLs Intravenous Contrast Given 06/25/13 0949)    DIAGNOSTIC STUDIES: Oxygen Saturation is 100% on room air, normal by my interpretation.    COORDINATION OF CARE: 7:45 AM-Discussed treatment plan which includes medications, CBC panel, CMP and lipase with pt at bedside and pt agreed to plan.   8:50 AM-Pt rechecked and still reports pain but states that nausea is improved. He points to his lower abdomen and states that the pain radiates around to his back currently. Pt denies dysuria and states that Flomax helps him have a solid steady stream. Discussed CT scan of abdomen with pt and pt agreed.  10:29 AM-Pt rechecked and feels improved with medications listed above. Informed pt of CT results showing pancreatitic pseudo cysts. Discussed admission for pain control with plans to discharge to rehab with pt and pt agreed.   Labs Reviewed  CBC WITH DIFFERENTIAL - Abnormal; Notable for the following:    Platelets 414 (*)    All other components within normal limits  COMPREHENSIVE METABOLIC PANEL - Abnormal; Notable for the following:    Potassium 3.4 (*)    Glucose, Bld 184 (*)    BUN 3 (*)    Albumin 3.3 (*)    AST 70 (*)    Alkaline Phosphatase 131 (*)    All other components within normal limits  LIPASE, BLOOD  URINALYSIS, ROUTINE W REFLEX MICROSCOPIC   Ct Abdomen Pelvis W Contrast  06/25/2013   *RADIOLOGY REPORT*  Clinical Data: Epigastric pain, history of pancreatitis  CT ABDOMEN AND PELVIS WITH CONTRAST  Technique:  Multidetector CT imaging of the abdomen and pelvis was performed following the standard protocol during bolus administration of intravenous contrast.   Contrast: 50mL OMNIPAQUE IOHEXOL 300 MG/ML  SOLN, OMNIPAQUE IOHEXOL 300 MG/ML  SOLN  Comparison: Most recent prior CT abdomen/pelvis 04/29/2013  Findings:  Lower Chest:  Mild dependent atelectasis in the bilateral lower lobes.  The lung bases are otherwise clear.  Visualized heart within normal limits for size.  No pericardial effusion. Unremarkable distal thoracic esophagus.  Abdomen: Interval development of several circumscribed fluid collections in the previously identified region of inflammation in the pancreatic tail.  One fluid collections just superior to the right kidney measures 3.6 x 2.1 x 1.9 cm in transverse dimension. A second fluid collection in the lateral perinephric space measures 2.6 x 2.6 x 3.8 cm. No evidence of gas within the fluid collections.  The fluid collections are loculated with thin peripheral enhancement.  There are persistent changes of inflammatory stranding in the surrounding peripancreatic and perinephric fat.  Secondary thickening of the posterior gastric wall, left pararenal and lateral conal fascia and splenic flexure of the colon. Persistent occlusion of the splenic vein with multiple splenic collaterals.  The splenic artery is not well seen given the portal venous phase timing of the study.  No definite pseudoaneurysm or hematoma identified.  No significant inflammation of the pancreatic neck, head or uncinate process.  Diffusely low attenuation of the hepatic parenchyma consistent with hepatic steatosis.  Small gallstones layer within the gallbladder lumen.  The gallbladder is minimally distended there is no significant gallbladder wall thickening or pericholecystic fluid by CT scan.  No intra or extrahepatic biliary ductal dilatation.  The adrenal glands are within normal limits.  Symmetric parenchymal enhancement the bilateral kidneys.  No hydronephrosis or nephrolithiasis.  There is a small 1.7 x 0.9 mm developing subcapsular fluid collection along the lateral aspect  of the left kidney in close approximation with the other perinephric fluid collections.  Normal-caliber large and small bowel.  No evidence of bowel obstruction.  As mentioned above, there is secondary reactive thickening of the colon in the region of the splenic flexure. Interval resolution of circumferential thickening of the distal rectum.  Decreased colonic ileus with improved dilatation of the cecum and ascending colon.  Pelvis: Small amount of free fluid in the rectovesicular recess which has decreased compared to prior.  The bladder appears unremarkable for the level of distension.  Unremarkable prostate gland and seminal vesicles.  Bones: No acute fracture or aggressive appearing lytic or blastic osseous lesion.  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  Splenic artery not well seen.  No definite pseudoaneurysm.  IMPRESSION:  1.  Continued evolution of pancreatitis involving the pancreatic tail and distal body with interval development of loculated fluid collections within the pancreatic tail and left perinephric space. The fluid collections have thin peripheral enhancement but no internal gas.  Primary differential consideration is multi focal pancreatic pseudocyst.  The sterility these collections cannot be confirmed radiographically.  There is associated secondary inflammatory thickening the posterior gastric body, splenic flexure of the colon, anterior pararenal fascia and left lateral coronal fascia.  2.  Small developing subcapsular fluid collection along the lateral aspect of the left kidney likely reflecting a small satellite pancreatic pseudocyst.  Again, sterility of the fluid cannot be assessed radiographically.  3.  Stable appearance of chronic occlusion of the splenic vein with associated collateral formation.  4.  Cholelithiasis without cholecystitis by CT.  5.  Hepatic steatosis.  6. Compared to prior, interval resolution of pleural effusions, clearing of  the lung bases, resolution of rectal wall thickening and cecal and ascending colonic ileus.  7.  Additional ancillary findings as above without significant interval change.   Original Report Authenticated By: Malachy Moan, M.D.   I personally reviewed the imaging tests through PACS system I reviewed available ER/hospitalization records through the EMR   1. Pancreatic pseudocyst/cyst     MDM  Pt may be developing pancreatic pseudocyst vs developing pancreatic abscess. Will admit for pain control and additional workup.     I personally performed the services described in this documentation, which was scribed in my presence. The recorded information has been reviewed and is accurate.      Lyanne Co, MD 06/25/13 [phone]

## 2013-06-26 LAB — CBC
HCT: 35.8 % — ABNORMAL LOW (ref 39.0–52.0)
Hemoglobin: 11.9 g/dL — ABNORMAL LOW (ref 13.0–17.0)
MCH: 32.1 pg (ref 26.0–34.0)
MCHC: 33.2 g/dL (ref 30.0–36.0)
MCV: 96.5 fL (ref 78.0–100.0)
Platelets: 403 10*3/uL — ABNORMAL HIGH (ref 150–400)
RBC: 3.71 MIL/uL — ABNORMAL LOW (ref 4.22–5.81)
RDW: 15 % (ref 11.5–15.5)
WBC: 6.7 10*3/uL (ref 4.0–10.5)

## 2013-06-26 LAB — COMPREHENSIVE METABOLIC PANEL
ALT: 13 U/L (ref 0–53)
AST: 37 U/L (ref 0–37)
Albumin: 2.6 g/dL — ABNORMAL LOW (ref 3.5–5.2)
Alkaline Phosphatase: 98 U/L (ref 39–117)
BUN: <3 mg/dL — ABNORMAL LOW (ref 6–23)
CO2: 27 mEq/L (ref 19–32)
Calcium: 8.2 mg/dL — ABNORMAL LOW (ref 8.4–10.5)
Chloride: 102 mEq/L (ref 96–112)
Creatinine, Ser: 0.7 mg/dL (ref 0.50–1.35)
GFR calc Af Amer: >90 mL/min (ref >90)
GFR calc non Af Amer: >90 mL/min (ref >90)
Glucose, Bld: 114 mg/dL — ABNORMAL HIGH (ref 70–99)
Potassium: 3 mEq/L — ABNORMAL LOW (ref 3.5–5.1)
Sodium: 137 mEq/L (ref 135–145)
Total Bilirubin: 0.3 mg/dL (ref 0.3–1.2)
Total Protein: 6.2 g/dL (ref 6.0–8.3)

## 2013-06-26 LAB — AMYLASE: Amylase: 49 U/L (ref 0–105)

## 2013-06-26 MED ORDER — MORPHINE SULFATE 4 MG/ML IJ SOLN
3.0000 mg | INTRAMUSCULAR | Status: DC | PRN
  Administered 2013-06-26 – 2013-06-27 (×7): 3 mg via INTRAVENOUS
  Filled 2013-06-26 (×7): qty 1

## 2013-06-26 MED ORDER — POTASSIUM CHLORIDE CRYS ER 20 MEQ PO TBCR
30.0000 meq | EXTENDED_RELEASE_TABLET | Freq: Two times a day (BID) | ORAL | Status: AC
  Administered 2013-06-26 (×2): 30 meq via ORAL
  Filled 2013-06-26 (×2): qty 1

## 2013-06-26 NOTE — Progress Notes (Signed)
[facility] PROGRESS NOTE  [name] [name] ZOX:[id number] DOB: [birth year] DOA: 06/25/2013 PCP: Milinda Antis, MD  HPI: [name] [name] is a 52 y.o. male has a past medical history significant for polysubstance abuse, including alcohol, hypertension, recent history of pancreatitis for which he was hospitalized here at the beginning of May, presents to the emergency room with a chief complaint of severe epigastric abdominal pain radiating into his back and left flank. CT scan of the abdomen showed interval development of loculated fluid collections within the pancreatic tail and left perinephric space.  Assessment/Plan: Abdominal pain - due to new findings on the CT scan; acute on chronic pancreatitis complicated by new pseudocysts;  - N.p.o., IV fluids, pain control  - We'll consult gastroenterology to help with further management; needs npo for prolonged time, few weeks, start TPN on Monday; PICC on Monday - consider IR drainage but clinically the collections do not appear infectious as patient is without leukocytosis or febrile.  ETOH abuse  - CIWA  - wishes to go to rehab on discharge  Elevated LFTs  - due to #2  DVT Prophylaxis  - heparin s.q.   Code Status: Full Family Communication: none  Disposition Plan: inpatient  Consultants:  Gastroenterology  Procedures:  none  Antibiotics:  Anti-infectives   None     Antibiotics Given (last 72 hours)   None     HPI/Subjective: - his pain is not well controlled with current dosing.   Objective: Filed Vitals:   06/25/13 2030 06/25/13 2053 06/26/13 0434 06/26/13 0828  BP: 128/85  115/75   Pulse: 90 96 88 90  Temp: 97.8 F (36.6 C)  97.6 F (36.4 C)   TempSrc: Oral  Oral   Resp: 20 18 20    Height:      Weight:      SpO2: 99% 96% 97% 90%    Intake/Output Summary (Last 24 hours) at 06/26/13 1103 Last data filed at 06/26/13 0443  Gross per 24 hour  Intake 2106.67 ml  Output    200 ml  Net 1906.67 ml   Filed  Weights   06/25/13 0734  Weight: 83.915 kg (185 lb)    Exam:   General:  NAD  Cardiovascular: regular rate and rhythm, without MRG  Respiratory: good air movement, clear to auscultation throughout, no wheezing, ronchi or rales  Abdomen: soft, not tender to palpation, positive bowel sounds  MSK: no peripheral edema  Neuro: CN 2-12 grossly intact, MS 5/5 in all 4  Data Reviewed: Basic Metabolic Panel:  Recent Labs Lab 06/25/13 0738 06/26/13 0605  NA 135 137  K 3.4* 3.0*  CL 97 102  CO2 26 27  GLUCOSE 184* 114*  BUN 3* <3*  CREATININE 0.74 0.70  CALCIUM 9.1 8.2*   Liver Function Tests:  Recent Labs Lab 06/25/13 0738 06/26/13 0605  AST 70* 37  ALT 13 13  ALKPHOS 131* 98  BILITOT 0.3 0.3  PROT 7.7 6.2  ALBUMIN 3.3* 2.6*    Recent Labs Lab 06/25/13 0738 06/26/13 0605  LIPASE 41  --   AMYLASE  --  49   CBC:  Recent Labs Lab 06/25/13 0738 06/26/13 0605  WBC 5.6 6.7  NEUTROABS 3.6  --   HGB 14.2 11.9*  HCT 41.6 35.8*  MCV 95.2 96.5  PLT 414* 403*    Studies: Ct Abdomen Pelvis W Contrast  06/25/2013   *RADIOLOGY REPORT*  Clinical Data: Epigastric pain, history of pancreatitis  CT ABDOMEN AND PELVIS WITH CONTRAST  Technique:  Multidetector CT imaging of the abdomen and pelvis was performed following the standard protocol during bolus administration of intravenous contrast.  Contrast: 50mL OMNIPAQUE IOHEXOL 300 MG/ML  SOLN, OMNIPAQUE IOHEXOL 300 MG/ML  SOLN  Comparison: Most recent prior CT abdomen/pelvis 04/29/2013  Findings:  Lower Chest:  Mild dependent atelectasis in the bilateral lower lobes.  The lung bases are otherwise clear.  Visualized heart within normal limits for size.  No pericardial effusion. Unremarkable distal thoracic esophagus.  Abdomen: Interval development of several circumscribed fluid collections in the previously identified region of inflammation in the pancreatic tail.  One fluid collections just superior to the right kidney  measures 3.6 x 2.1 x 1.9 cm in transverse dimension. A second fluid collection in the lateral perinephric space measures 2.6 x 2.6 x 3.8 cm. No evidence of gas within the fluid collections.  The fluid collections are loculated with thin peripheral enhancement.  There are persistent changes of inflammatory stranding in the surrounding peripancreatic and perinephric fat.  Secondary thickening of the posterior gastric wall, left pararenal and lateral conal fascia and splenic flexure of the colon. Persistent occlusion of the splenic vein with multiple splenic collaterals.  The splenic artery is not well seen given the portal venous phase timing of the study.  No definite pseudoaneurysm or hematoma identified.  No significant inflammation of the pancreatic neck, head or uncinate process.  Diffusely low attenuation of the hepatic parenchyma consistent with hepatic steatosis.  Small gallstones layer within the gallbladder lumen.  The gallbladder is minimally distended there is no significant gallbladder wall thickening or pericholecystic fluid by CT scan.  No intra or extrahepatic biliary ductal dilatation.  The adrenal glands are within normal limits.  Symmetric parenchymal enhancement the bilateral kidneys.  No hydronephrosis or nephrolithiasis.  There is a small 1.7 x 0.9 mm developing subcapsular fluid collection along the lateral aspect of the left kidney in close approximation with the other perinephric fluid collections.  Normal-caliber large and small bowel.  No evidence of bowel obstruction.  As mentioned above, there is secondary reactive thickening of the colon in the region of the splenic flexure. Interval resolution of circumferential thickening of the distal rectum.  Decreased colonic ileus with improved dilatation of the cecum and ascending colon.  Pelvis: Small amount of free fluid in the rectovesicular recess which has decreased compared to prior.  The bladder appears unremarkable for the level of  distension.  Unremarkable prostate gland and seminal vesicles.  Bones: No acute fracture or aggressive appearing lytic or blastic osseous lesion.  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  Splenic artery not well seen.  No definite pseudoaneurysm.  IMPRESSION:  1.  Continued evolution of pancreatitis involving the pancreatic tail and distal body with interval development of loculated fluid collections within the pancreatic tail and left perinephric space. The fluid collections have thin peripheral enhancement but no internal gas.  Primary differential consideration is multi focal pancreatic pseudocyst.  The sterility these collections cannot be confirmed radiographically.  There is associated secondary inflammatory thickening the posterior gastric body, splenic flexure of the colon, anterior pararenal fascia and left lateral coronal fascia.  2.  Small developing subcapsular fluid collection along the lateral aspect of the left kidney likely reflecting a small satellite pancreatic pseudocyst.  Again, sterility of the fluid cannot be assessed radiographically.  3.  Stable appearance of chronic occlusion of the splenic vein with associated collateral formation.  4.  Cholelithiasis without cholecystitis by CT.  5.  Hepatic steatosis.  6. Compared to prior, interval resolution of pleural effusions, clearing of the lung bases, resolution of rectal wall thickening and cecal and ascending colonic ileus.  7.  Additional ancillary findings as above without significant interval change.   Original Report Authenticated By: Malachy Moan, M.D.    Scheduled Meds: . gemfibrozil  600 mg Oral BID AC  . heparin  5,000 Units Subcutaneous Q8H  . multivitamin with minerals  1 tablet Oral Daily  . pantoprazole  40 mg Oral Daily  . potassium chloride  30 mEq Oral BID  . thiamine  100 mg Oral Daily   Or  . thiamine  100 mg Intravenous Daily   Continuous Infusions: . sodium chloride  Stopped (06/25/13 1129)  . sodium chloride 100 mL/hr at 06/25/13 2249    Active Problems:   Alcohol abuse   Pseudocyst of pancreas   Tobacco user   Transaminitis   Polysubstance abuse   Acute alcoholic pancreatitis   Time spent: 25  Pamella Pert, MD [facility] Pager [phone]. If 7 PM - 7 AM, please contact night-coverage at [email or web address], password [facility] 06/26/2013, 11:03 AM  LOS: 1 day

## 2013-06-26 NOTE — Progress Notes (Signed)
Subjective; Patient states he is hungry but he continues to complain of severe pain across his upper abdomen. He states pain is no worse than it was yesterday.  Objective; BP 115/75  Pulse 90  Temp(Src) 97.6 F (36.4 C) (Oral)  Resp 20  Ht 5\' 9"  (1.753 m)  Wt 185 lb (83.915 kg)  BMI 27.31 kg/m2  SpO2 90% Fullness persists and upper abdomen today he is moderate to severe tenderness with guarding. No masses noted.  Lab data; WBC 6.7, H&H 11.9 and 35.8 and platelet count 403K. Serum amylase 49. Serum sodium 137, potassium 3.0, right 102, CO2 27, glucose 114, BUN less than 3, creatinine 0.70, bilirubin oh 0.3, AP 98, AST 37, ALT 13, albumin 2.6 and calcium 8.2.  Assessment; Alcoholic pancreatitis complicated by multiple pseudocysts. Patient does not appear toxic or acutely ill. Patient has declined nasojejunal feeding. He will not be able to get PICC line until 06/28/2013 at which time he will be started on TPN. Transaminases are normal. Mild anemia secondary to acute illness. Hypokalemia being treated. Condition discussed with the patient's fiance  Ms. Johnella Moloney is in the room with him.  Recommendations; Continue n.p.o. status except for by mouth medications.  TPN will be started on 06/28/2013 when PICC line placed. I will see patient on 06/28/2013. Please page me at [phone] if you have any questions.

## 2013-06-27 LAB — BASIC METABOLIC PANEL
BUN: 3 mg/dL — ABNORMAL LOW (ref 6–23)
CO2: 29 mEq/L (ref 19–32)
Calcium: 8.6 mg/dL (ref 8.4–10.5)
Chloride: 102 mEq/L (ref 96–112)
Creatinine, Ser: 0.77 mg/dL (ref 0.50–1.35)
GFR calc Af Amer: >90 mL/min (ref >90)
GFR calc non Af Amer: >90 mL/min (ref >90)
Glucose, Bld: 92 mg/dL (ref 70–99)
Potassium: 3.2 mEq/L — ABNORMAL LOW (ref 3.5–5.1)
Sodium: 138 mEq/L (ref 135–145)

## 2013-06-27 LAB — HEPATITIS C ANTIBODY: HCV Ab: NEGATIVE

## 2013-06-27 LAB — HEPATITIS B SURFACE ANTIGEN: Hepatitis B Surface Ag: NEGATIVE

## 2013-06-27 MED ORDER — BISACODYL 10 MG RE SUPP: 10.0000 mg | Freq: Every day | RECTAL | Status: DC | PRN

## 2013-06-27 MED ORDER — POLYETHYLENE GLYCOL 3350 17 G PO PACK
17.0000 g | PACK | Freq: Two times a day (BID) | ORAL | Status: DC
  Administered 2013-06-27 – 2013-06-28 (×3): 17 g via ORAL
  Filled 2013-06-27 (×3): qty 1

## 2013-06-27 MED ORDER — POTASSIUM CHLORIDE CRYS ER 20 MEQ PO TBCR
40.0000 meq | EXTENDED_RELEASE_TABLET | Freq: Once | ORAL | Status: AC
  Administered 2013-06-27: 40 meq via ORAL
  Filled 2013-06-27: qty 2

## 2013-06-27 MED ORDER — MORPHINE SULFATE 4 MG/ML IJ SOLN
3.0000 mg | INTRAMUSCULAR | Status: DC | PRN
  Administered 2013-06-27 – 2013-06-29 (×24): 3 mg via INTRAVENOUS
  Filled 2013-06-27 (×24): qty 1

## 2013-06-27 NOTE — Progress Notes (Signed)
[facility] PROGRESS NOTE  [name] [name] XBJ:[id number] DOB: 07-12-1961 DOA: 06/25/2013 PCP: Milinda Antis, MD  HPI: [name] [name] is a 52 y.o. male has a past medical history significant for polysubstance abuse, including alcohol, hypertension, recent history of pancreatitis for which he was hospitalized here at the beginning of May, presents to the emergency room with a chief complaint of severe epigastric abdominal pain radiating into his back and left flank. CT scan of the abdomen showed interval development of loculated fluid collections within the pancreatic tail and left perinephric space.  Assessment/Plan: Abdominal pain - due to new findings on the CT scan; acute on chronic pancreatitis complicated by new pseudocysts; PICC Line and TPN tomorrow.  ETOH abuse - CIWA  - wishes to go to rehab on discharge, will get CM and SW involved tomorrow.  Elevated LFTs - due to #2  DVT Prophylaxis - heparin s.q.  Code Status: Full Family Communication: none  Disposition Plan: inpatient  Consultants:  Gastroenterology  Procedures:  none  Antibiotics:  Anti-infectives   None     Antibiotics Given (last 72 hours)   None     HPI/Subjective: - feels "OK", hungry and with "a sore abdomen"  Objective: Filed Vitals:   06/26/13 1458 06/26/13 2123 06/27/13 0514 06/27/13 0837  BP: 139/91 137/93 129/85   Pulse: 85 87 87 77  Temp: 98.7 F (37.1 C) 98.7 F (37.1 C) 98.2 F (36.8 C)   TempSrc: Oral Oral Oral   Resp: 20 20 20    Height:      Weight:      SpO2: 96% 97% 95% 92%    Intake/Output Summary (Last 24 hours) at 06/27/13 0838 Last data filed at 06/27/13 0514  Gross per 24 hour  Intake 3143.33 ml  Output      0 ml  Net 3143.33 ml   Filed Weights   06/25/13 0734  Weight: 83.915 kg (185 lb)    Exam:   General:  NAD  Cardiovascular: regular rate and rhythm, without MRG  Respiratory: good air movement, clear to auscultation throughout, no wheezing, ronchi or  rales  Abdomen: soft, tender to palpation, positive bowel sounds  MSK: no peripheral edema  Neuro: CN 2-12 grossly intact, MS 5/5 in all 4  Data Reviewed: Basic Metabolic Panel:  Recent Labs Lab 06/25/13 0738 06/26/13 0605 06/27/13 0620  NA 135 137 138  K 3.4* 3.0* 3.2*  CL 97 102 102  CO2 26 27 29   GLUCOSE 184* 114* 92  BUN 3* <3* 3*  CREATININE 0.74 0.70 0.77  CALCIUM 9.1 8.2* 8.6   Liver Function Tests:  Recent Labs Lab 06/25/13 0738 06/26/13 0605  AST 70* 37  ALT 13 13  ALKPHOS 131* 98  BILITOT 0.3 0.3  PROT 7.7 6.2  ALBUMIN 3.3* 2.6*    Recent Labs Lab 06/25/13 0738 06/26/13 0605  LIPASE 41  --   AMYLASE  --  49   CBC:  Recent Labs Lab 06/25/13 0738 06/26/13 0605  WBC 5.6 6.7  NEUTROABS 3.6  --   HGB 14.2 11.9*  HCT 41.6 35.8*  MCV 95.2 96.5  PLT 414* 403*    Studies: Ct Abdomen Pelvis W Contrast  06/25/2013   *RADIOLOGY REPORT*  Clinical Data: Epigastric pain, history of pancreatitis  CT ABDOMEN AND PELVIS WITH CONTRAST  Technique:  Multidetector CT imaging of the abdomen and pelvis was performed following the standard protocol during bolus administration of intravenous contrast.  Contrast: 50mL OMNIPAQUE IOHEXOL 300 MG/ML  SOLN,  OMNIPAQUE IOHEXOL 300 MG/ML  SOLN  Comparison: Most recent prior CT abdomen/pelvis 04/29/2013  Findings:  Lower Chest:  Mild dependent atelectasis in the bilateral lower lobes.  The lung bases are otherwise clear.  Visualized heart within normal limits for size.  No pericardial effusion. Unremarkable distal thoracic esophagus.  Abdomen: Interval development of several circumscribed fluid collections in the previously identified region of inflammation in the pancreatic tail.  One fluid collections just superior to the right kidney measures 3.6 x 2.1 x 1.9 cm in transverse dimension. A second fluid collection in the lateral perinephric space measures 2.6 x 2.6 x 3.8 cm. No evidence of gas within the fluid collections.   The fluid collections are loculated with thin peripheral enhancement.  There are persistent changes of inflammatory stranding in the surrounding peripancreatic and perinephric fat.  Secondary thickening of the posterior gastric wall, left pararenal and lateral conal fascia and splenic flexure of the colon. Persistent occlusion of the splenic vein with multiple splenic collaterals.  The splenic artery is not well seen given the portal venous phase timing of the study.  No definite pseudoaneurysm or hematoma identified.  No significant inflammation of the pancreatic neck, head or uncinate process.  Diffusely low attenuation of the hepatic parenchyma consistent with hepatic steatosis.  Small gallstones layer within the gallbladder lumen.  The gallbladder is minimally distended there is no significant gallbladder wall thickening or pericholecystic fluid by CT scan.  No intra or extrahepatic biliary ductal dilatation.  The adrenal glands are within normal limits.  Symmetric parenchymal enhancement the bilateral kidneys.  No hydronephrosis or nephrolithiasis.  There is a small 1.7 x 0.9 mm developing subcapsular fluid collection along the lateral aspect of the left kidney in close approximation with the other perinephric fluid collections.  Normal-caliber large and small bowel.  No evidence of bowel obstruction.  As mentioned above, there is secondary reactive thickening of the colon in the region of the splenic flexure. Interval resolution of circumferential thickening of the distal rectum.  Decreased colonic ileus with improved dilatation of the cecum and ascending colon.  Pelvis: Small amount of free fluid in the rectovesicular recess which has decreased compared to prior.  The bladder appears unremarkable for the level of distension.  Unremarkable prostate gland and seminal vesicles.  Bones: No acute fracture or aggressive appearing lytic or blastic osseous lesion.  Vascular: Chronic occlusion of the splenic vein with  collateral development.  Splenic artery not well seen.  No definite pseudoaneurysm.  IMPRESSION:  1.  Continued evolution of pancreatitis involving the pancreatic tail and distal body with interval development of loculated fluid collections within the pancreatic tail and left perinephric space. The fluid collections have thin peripheral enhancement but no internal gas.  Primary differential consideration is multi focal pancreatic pseudocyst.  The sterility these collections cannot be confirmed radiographically.  There is associated secondary inflammatory thickening the posterior gastric body, splenic flexure of the colon, anterior pararenal fascia and left lateral coronal fascia.  2.  Small developing subcapsular fluid collection along the lateral aspect of the left kidney likely reflecting a small satellite pancreatic pseudocyst.  Again, sterility of the fluid cannot be assessed radiographically.  3.  Stable appearance of chronic occlusion of the splenic vein with associated collateral formation.  4.  Cholelithiasis without cholecystitis by CT.  5.  Hepatic steatosis.  6. Compared to prior, interval resolution of pleural effusions, clearing of the lung bases, resolution of rectal wall thickening and cecal and ascending colonic ileus.  7.  Additional ancillary findings as above without significant interval change.   Original Report Authenticated By: Malachy Moan, M.D.    Scheduled Meds: . gemfibrozil  600 mg Oral BID AC  . heparin  5,000 Units Subcutaneous Q8H  . multivitamin with minerals  1 tablet Oral Daily  . pantoprazole  40 mg Oral Daily  . potassium chloride  40 mEq Oral Once  . thiamine  100 mg Oral Daily   Or  . thiamine  100 mg Intravenous Daily   Continuous Infusions: . sodium chloride Stopped (06/25/13 1129)  . sodium chloride 100 mL/hr at 06/27/13 1610    Active Problems:   Alcohol abuse   Pseudocyst of pancreas   Tobacco user   Transaminitis   Polysubstance abuse   Acute  alcoholic pancreatitis   Time spent: 25  Pamella Pert, MD [facility] Pager [phone]. If 7 PM - 7 AM, please contact night-coverage at [email or web address], password [facility] 06/27/2013, 8:38 AM  LOS: 2 days

## 2013-06-28 ENCOUNTER — Inpatient Hospital Stay (HOSPITAL_COMMUNITY): Payer: Medicare Other

## 2013-06-28 ENCOUNTER — Encounter (HOSPITAL_COMMUNITY): Payer: Self-pay

## 2013-06-28 DIAGNOSIS — K5900 Constipation, unspecified: Secondary | ICD-10-CM

## 2013-06-28 LAB — LIPID PANEL
Cholesterol: 206 mg/dL — ABNORMAL HIGH (ref 0–200)
HDL: 93 mg/dL (ref >39)
LDL Cholesterol: 90 mg/dL (ref 0–99)
Total CHOL/HDL Ratio: 2.2 RATIO
Triglycerides: 115 mg/dL (ref <150)
VLDL: 23 mg/dL (ref 0–40)

## 2013-06-28 LAB — BASIC METABOLIC PANEL
BUN: 3 mg/dL — ABNORMAL LOW (ref 6–23)
CO2: 26 mEq/L (ref 19–32)
Calcium: 9.2 mg/dL (ref 8.4–10.5)
Chloride: 100 mEq/L (ref 96–112)
Creatinine, Ser: 0.74 mg/dL (ref 0.50–1.35)
GFR calc Af Amer: >90 mL/min (ref >90)
GFR calc non Af Amer: >90 mL/min (ref >90)
Glucose, Bld: 92 mg/dL (ref 70–99)
Potassium: 3.8 mEq/L (ref 3.5–5.1)
Sodium: 136 mEq/L (ref 135–145)

## 2013-06-28 LAB — HEPATIC FUNCTION PANEL
ALT: 10 U/L (ref 0–53)
AST: 25 U/L (ref 0–37)
Albumin: 3 g/dL — ABNORMAL LOW (ref 3.5–5.2)
Alkaline Phosphatase: 114 U/L (ref 39–117)
Bilirubin, Direct: 0.1 mg/dL (ref 0.0–0.3)
Indirect Bilirubin: 0.2 mg/dL — ABNORMAL LOW (ref 0.3–0.9)
Total Bilirubin: 0.3 mg/dL (ref 0.3–1.2)
Total Protein: 7.5 g/dL (ref 6.0–8.3)

## 2013-06-28 LAB — CBC
HCT: 39.7 % (ref 39.0–52.0)
Hemoglobin: 13.2 g/dL (ref 13.0–17.0)
MCH: 31.8 pg (ref 26.0–34.0)
MCHC: 33.2 g/dL (ref 30.0–36.0)
MCV: 95.7 fL (ref 78.0–100.0)
Platelets: 443 10*3/uL — ABNORMAL HIGH (ref 150–400)
RBC: 4.15 MIL/uL — ABNORMAL LOW (ref 4.22–5.81)
RDW: 15 % (ref 11.5–15.5)
WBC: 6 10*3/uL (ref 4.0–10.5)

## 2013-06-28 LAB — GLUCOSE, CAPILLARY
Glucose-Capillary: 103 mg/dL — ABNORMAL HIGH (ref 70–99)
Glucose-Capillary: 52 mg/dL — ABNORMAL LOW (ref 70–99)
Glucose-Capillary: 67 mg/dL — ABNORMAL LOW (ref 70–99)
Glucose-Capillary: 84 mg/dL (ref 70–99)
Glucose-Capillary: 89 mg/dL (ref 70–99)

## 2013-06-28 LAB — MAGNESIUM: Magnesium: 2.1 mg/dL (ref 1.5–2.5)

## 2013-06-28 LAB — PHOSPHORUS: Phosphorus: 3.9 mg/dL (ref 2.3–4.6)

## 2013-06-28 MED ORDER — TRACE MINERALS CR-CU-F-FE-I-MN-MO-SE-ZN IV SOLN
INTRAVENOUS | Status: AC
  Administered 2013-06-28: 20:00:00 via INTRAVENOUS
  Filled 2013-06-28: qty 2000

## 2013-06-28 MED ORDER — POLYETHYLENE GLYCOL 3350 17 G PO PACK
17.0000 g | PACK | Freq: Every day | ORAL | Status: DC
  Administered 2013-06-29: 17 g via ORAL
  Filled 2013-06-28: qty 1

## 2013-06-28 MED ORDER — INSULIN ASPART 100 UNIT/ML ~~LOC~~ SOLN: 0.0000 [IU] | Freq: Four times a day (QID) | SUBCUTANEOUS | Status: DC

## 2013-06-28 MED ORDER — BISACODYL 5 MG PO TBEC
10.0000 mg | DELAYED_RELEASE_TABLET | Freq: Every day | ORAL | Status: DC
  Administered 2013-06-28 – 2013-06-29 (×2): 10 mg via ORAL
  Filled 2013-06-28: qty 2
  Filled 2013-06-28: qty 1
  Filled 2013-06-28: qty 2

## 2013-06-28 MED ORDER — SODIUM CHLORIDE 0.9 % IJ SOLN
10.0000 mL | Freq: Two times a day (BID) | INTRAMUSCULAR | Status: DC
  Administered 2013-06-28: 10 mL

## 2013-06-28 MED ORDER — DEXTROSE 50 % IV SOLN: 25.0000 mL | Freq: Once | INTRAVENOUS | Status: AC | PRN

## 2013-06-28 MED ORDER — DEXTROSE 50 % IV SOLN
INTRAVENOUS | Status: AC
  Administered 2013-06-28: 25 mL
  Filled 2013-06-28: qty 50

## 2013-06-28 MED ORDER — SODIUM CHLORIDE 0.9 % IJ SOLN: 10.0000 mL | INTRAMUSCULAR | Status: DC | PRN

## 2013-06-28 MED ORDER — FAT EMULSION 20 % IV EMUL
250.0000 mL | INTRAVENOUS | Status: AC
  Administered 2013-06-28: 250 mL via INTRAVENOUS
  Filled 2013-06-28: qty 250

## 2013-06-28 NOTE — Progress Notes (Signed)
PARENTERAL NUTRITION CONSULT NOTE - INITIAL  Pharmacy Consult for TPN Indication: Alcoholic pancreatitis   Allergies  Allergen Reactions  . Penicillins Itching    Patient Measurements: Height: 5\' 9"  (175.3 cm) Weight: 185 lb (83.915 kg) IBW/kg (Calculated) : 70.7 Adjusted Body Weight: 75  Vital Signs: Temp: 97.8 F (36.6 C) (07/07 2130) Temp src: Oral (07/07 0614) BP: 135/90 mmHg (07/07 0614) Pulse Rate: 87 (07/07 0614) Intake/Output from previous day: 07/06 0701 - 07/07 0700 In: 2746.7 [P.O.:360; I.V.:2386.7] Out: -  Intake/Output from this shift:    Labs:  Recent Labs  06/26/13 0605 06/28/13 0611  WBC 6.7 6.0  HGB 11.9* 13.2  HCT 35.8* 39.7  PLT 403* 443*     Recent Labs  06/26/13 0605 06/27/13 0620 06/28/13 0611 06/28/13 0841  NA 137 138 136  --   K 3.0* 3.2* 3.8  --   CL 102 102 100  --   CO2 27 29 26   --   GLUCOSE 114* 92 92  --   BUN <3* 3* 3*  --   CREATININE 0.70 0.77 0.74  --   CALCIUM 8.2* 8.6 9.2  --   MG  --   --  2.1  --   PHOS  --   --  3.9  --   PROT 6.2  --  7.5  --   ALBUMIN 2.6*  --  3.0*  --   AST 37  --  25  --   ALT 13  --  10  --   ALKPHOS 98  --  114  --   BILITOT 0.3  --  0.3  --   BILIDIR  --   --  0.1  --   IBILI  --   --  0.2*  --   TRIG  --   --   --  115  CHOLHDL  --   --   --  2.2  CHOL  --   --   --  206*   Estimated Creatinine Clearance: 110.5 ml/min (by C-G formula based on Cr of 0.74).   No results found for this basename: GLUCAP,  in the last 72 hours  Medical History: Past Medical History  Diagnosis Date  . Bronchitis   . Acid reflux   . Pancreatitis     March 2013  . Gout   . Bipolar disorder   . Hyperlipidemia   . Alcohol abuse     6-8 cans of beer daily  . Pseudocyst of pancreas 02/28/2012  . Arthritis   . Fracture of lower leg   . Hypertension   . Chronic pain   . Chronic neck pain   . Chronic back pain   . Left arm pain     chronic    Medications:  Scheduled:  . gemfibrozil  600  mg Oral BID AC  . heparin  5,000 Units Subcutaneous Q8H  . multivitamin with minerals  1 tablet Oral Daily  . pantoprazole  40 mg Oral Daily  . polyethylene glycol  17 g Oral BID  . thiamine  100 mg Oral Daily   Or  . thiamine  100 mg Intravenous Daily    Insulin Requirements in the past 24 hours:  N/A  Current Nutrition:  NPO  Assessment: Patient with acute on chronic pancreatitis complicated by new pseudocysts currently NPO to be started on TPN w/ reduced lipids per MD request due to history elevated triglycerides.  Patient with a history of alcohol dependence.  Receiving thiamine and protonix via PO route.  Nutritional Goals:  Approximately 2250 kCal and 112.5 grams of protein per day  Plan:  Clinimix E 5/15 start at 40 ml/hr (titrate to goal rate approximately 50ml/hr). Lipids every MWF per MD request to reduce lipid exposure. Standard TPN labs. SSI sensitive, begin the evening and check CBG every 6 hours to start.  Lamonte Richer R 06/28/2013,10:42 AM

## 2013-06-28 NOTE — Progress Notes (Signed)
Pt became agitated stating that he was going to find him something to eat.  I told him I would start his TPN/Lipids. Prior to starting the infusion i checked his CBG which was 52.  I gave him per hypoglycemic protocol 25 mg of D50.  I wated 15 mins and rechecked the CBG and it was 67. Another 25mg  of D50 was given and the CBG was rechecked. It was 89.  Pt appears to be less anxious and states he feels some better.  i paged Dr. Sharl Ma with the situation and discussed the situation with Rexford Maus, Rn.  I left her information for the MD to contact.

## 2013-06-28 NOTE — Progress Notes (Addendum)
Patient ID: [name] [name], male   DOB: 10-28-1961, 52 y.o.   MRN: [id number] Sitting at side of bed. States he is hungry. Understands why he is NPO. He continues to have pain left upper quadrant and epigastric region. There has been no nausea or vomiting. States on discharge he is planning to go to rehab for etoh abuse. He is receiving pain medication IV every 2 hrs.(Morphine). Dilaudid made him itch.  He tells me he usually drinks a pint of liquor a day and a 6 pack of beer. Only drank 2 beers and 2 drinks on Thursday because he did not feel good. Hx of elevated triglycerides. Tawana Scale Vitals:   06/27/13 8295 06/27/13 1500 06/27/13 2242 06/28/13 0614  BP:  140/99 128/95 135/90  Pulse: 77 83 79 87  Temp:  98 F (36.7 C) 97.7 F (36.5 C) 97.8 F (36.6 C)  TempSrc:  Oral Oral Oral  Resp:  20 20 20   Height:      Weight:      SpO2: 92% 98% 97% 97%   Alert and oriented. Skin dry. Heart rate regular. Abdomen is slightly tense. BS are positive. Tenderness left upper abdomen and rt mid abdomen.  No edema to lower extremities.  Hepatic Function Panel     Component Value Date/Time   PROT 7.5 06/28/2013 0611   ALBUMIN 3.0* 06/28/2013 0611   AST 25 06/28/2013 0611   ALT 10 06/28/2013 0611   ALKPHOS 114 06/28/2013 0611   BILITOT 0.3 06/28/2013 0611   BILIDIR 0.1 06/28/2013 0611   IBILI 0.2* 06/28/2013 0611    Amylase 2 dayys ago 49.  Assessment/Plan. Alcoholic pancreatitis. He continues to have pain in his upper abdomen.  He will remain NPO. Arrangements for PICC Line for TPN. Patient advised that he needs to stop drinking. He is willing to go to rehab.  Lipid panel.   GI attending note; Patient has PICC line in place. Pharmacy has been consulted to assist with TPN. Patient had triglyceride level of greater than 1700 in March 2012 as discovered by Ms. TerriSetzer, NP. Repeat level is pending. Once again need for patient to be n.p.o. Explained to him.

## 2013-06-28 NOTE — Plan of Care (Signed)
Problem: Phase III Progression Outcomes Goal: Pain controlled on oral analgesia Outcome: Not Progressing Pt continues to need IV pain medication in order to control his pain.

## 2013-06-28 NOTE — Progress Notes (Signed)
[facility] PROGRESS NOTE  Sherron Mummert NGE:[id number] DOB: 1961/09/18 DOA: 06/25/2013 PCP: Milinda Antis, MD  HPI: [name] [name] is a 52 y.o. male has a past medical history significant for polysubstance abuse, including alcohol, hypertension, recent history of pancreatitis for which he was hospitalized here at the beginning of May, presents to the emergency room with a chief complaint of severe epigastric abdominal pain radiating into his back and left flank. CT scan of the abdomen showed interval development of loculated fluid collections within the pancreatic tail and left perinephric space.  Assessment/Plan:  Abdominal pain - due to new findings on the CT scan; acute on chronic pancreatitis complicated by new pseudocysts; PICC Line placed today - start TPN ETOH abuse - CIWA, not triggering. Wishes to go to rehab on discharge, will get CM and SW involved tomorrow.  Elevated LFTs - improving DVT Prophylaxis - heparin s.q.  Code Status: Full Family Communication: none  Disposition Plan: inpatient, appreciate SW and CM with placement.   Consultants:  Gastroenterology  Procedures:  none  Antibiotics:  Anti-infectives   None     Antibiotics Given (last 72 hours)   None     HPI/Subjective: - feels "OK", hungry and with "a sore abdomen"  Objective: Filed Vitals:   06/27/13 0837 06/27/13 1500 06/27/13 2242 06/28/13 0614  BP:  140/99 128/95 135/90  Pulse: 77 83 79 87  Temp:  98 F (36.7 C) 97.7 F (36.5 C) 97.8 F (36.6 C)  TempSrc:  Oral Oral Oral  Resp:  20 20 20   Height:      Weight:      SpO2: 92% 98% 97% 97%    Intake/Output Summary (Last 24 hours) at 06/28/13 1319 Last data filed at 06/28/13 4010  Gross per 24 hour  Intake 2746.67 ml  Output      0 ml  Net 2746.67 ml   Filed Weights   06/25/13 0734  Weight: 83.915 kg (185 lb)    Exam:   General:  NAD  Cardiovascular: regular rate and rhythm, without MRG  Respiratory: good air movement,  clear to auscultation throughout, no wheezing, ronchi or rales  Abdomen: soft, tender to palpation, positive bowel sounds  MSK: no peripheral edema  Neuro: CN 2-12 grossly intact, MS 5/5 in all 4  Data Reviewed: Basic Metabolic Panel:  Recent Labs Lab 06/25/13 0738 06/26/13 0605 06/27/13 0620 06/28/13 0611  NA 135 137 138 136  K 3.4* 3.0* 3.2* 3.8  CL 97 102 102 100  CO2 26 27 29 26   GLUCOSE 184* 114* 92 92  BUN 3* <3* 3* 3*  CREATININE 0.74 0.70 0.77 0.74  CALCIUM 9.1 8.2* 8.6 9.2  MG  --   --   --  2.1  PHOS  --   --   --  3.9   Liver Function Tests:  Recent Labs Lab 06/25/13 0738 06/26/13 0605 06/28/13 0611  AST 70* 37 25  ALT 13 13 10   ALKPHOS 131* 98 114  BILITOT 0.3 0.3 0.3  PROT 7.7 6.2 7.5  ALBUMIN 3.3* 2.6* 3.0*    Recent Labs Lab 06/25/13 0738 06/26/13 0605  LIPASE 41  --   AMYLASE  --  49   CBC:  Recent Labs Lab 06/25/13 0738 06/26/13 0605 06/28/13 0611  WBC 5.6 6.7 6.0  NEUTROABS 3.6  --   --   HGB 14.2 11.9* 13.2  HCT 41.6 35.8* 39.7  MCV 95.2 96.5 95.7  PLT 414* 403* 443*   Studies: Dg  Chest Port 1 View  06/28/2013   *RADIOLOGY REPORT*  Clinical Data: PICC line placement  PORTABLE CHEST - 1 VIEW  Comparison: 04/26/2013  Findings: Right arm PICC has its tip at the SVC/RA junction. Atelectasis is again seen in the lower lobes.  There is a small pleural effusion on the right.  IMPRESSION: Right arm PICC tip at the SVC/RA junction.   Original Report Authenticated By: Paulina Fusi, M.D.   Scheduled Meds: . gemfibrozil  600 mg Oral BID AC  . heparin  5,000 Units Subcutaneous Q8H  . insulin aspart  0-9 Units Subcutaneous Q6H  . pantoprazole  40 mg Oral Daily  . polyethylene glycol  17 g Oral BID  . sodium chloride  10-40 mL Intracatheter Q12H  . thiamine  100 mg Oral Daily   Or  . thiamine  100 mg Intravenous Daily   Continuous Infusions: . sodium chloride 100 mL/hr at 06/28/13 0134  . Marland KitchenTPN (CLINIMIX-E) Adult     And  . fat  emulsion     Active Problems:   Alcohol abuse   Pseudocyst of pancreas   Tobacco user   Transaminitis   Polysubstance abuse   Acute alcoholic pancreatitis  Time spent: 25  Pamella Pert, MD [facility] Pager [phone]. If 7 PM - 7 AM, please contact night-coverage at [email or web address], password [facility] 06/28/2013, 1:19 PM  LOS: 3 days

## 2013-06-29 ENCOUNTER — Inpatient Hospital Stay
Admission: AD | Admit: 2013-06-29 | Discharge: 2013-07-01 | Discharge status: Against Medical Advice | Payer: Self-pay | Source: Ambulatory Visit | Attending: Internal Medicine | Admitting: Internal Medicine

## 2013-06-29 DIAGNOSIS — F17200 Nicotine dependence, unspecified, uncomplicated: Secondary | ICD-10-CM

## 2013-06-29 LAB — DIFFERENTIAL
Basophils Absolute: 0 10*3/uL (ref 0.0–0.1)
Basophils Relative: 1 % (ref 0–1)
Eosinophils Absolute: 0.1 10*3/uL (ref 0.0–0.7)
Eosinophils Relative: 2 % (ref 0–5)
Lymphocytes Relative: 30 % (ref 12–46)
Lymphs Abs: 1.4 10*3/uL (ref 0.7–4.0)
Monocytes Absolute: 0.6 10*3/uL (ref 0.1–1.0)
Monocytes Relative: 12 % (ref 3–12)
Neutro Abs: 2.7 10*3/uL (ref 1.7–7.7)
Neutrophils Relative %: 56 % (ref 43–77)

## 2013-06-29 LAB — CBC
HCT: 36.2 % — ABNORMAL LOW (ref 39.0–52.0)
Hemoglobin: 12.3 g/dL — ABNORMAL LOW (ref 13.0–17.0)
MCH: 32.3 pg (ref 26.0–34.0)
MCHC: 34 g/dL (ref 30.0–36.0)
MCV: 95 fL (ref 78.0–100.0)
Platelets: 463 10*3/uL — ABNORMAL HIGH (ref 150–400)
RBC: 3.81 MIL/uL — ABNORMAL LOW (ref 4.22–5.81)
RDW: 14.8 % (ref 11.5–15.5)
WBC: 4.9 10*3/uL (ref 4.0–10.5)

## 2013-06-29 LAB — COMPREHENSIVE METABOLIC PANEL
ALT: 8 U/L (ref 0–53)
AST: 29 U/L (ref 0–37)
Albumin: 2.7 g/dL — ABNORMAL LOW (ref 3.5–5.2)
Alkaline Phosphatase: 98 U/L (ref 39–117)
BUN: 4 mg/dL — ABNORMAL LOW (ref 6–23)
CO2: 27 mEq/L (ref 19–32)
Calcium: 8.8 mg/dL (ref 8.4–10.5)
Chloride: 102 mEq/L (ref 96–112)
Creatinine, Ser: 0.82 mg/dL (ref 0.50–1.35)
GFR calc Af Amer: >90 mL/min (ref >90)
GFR calc non Af Amer: >90 mL/min (ref >90)
Glucose, Bld: 105 mg/dL — ABNORMAL HIGH (ref 70–99)
Potassium: 3.5 mEq/L (ref 3.5–5.1)
Sodium: 136 mEq/L (ref 135–145)
Total Bilirubin: 0.2 mg/dL — ABNORMAL LOW (ref 0.3–1.2)
Total Protein: 6.5 g/dL (ref 6.0–8.3)

## 2013-06-29 LAB — PHOSPHORUS: Phosphorus: 4.2 mg/dL (ref 2.3–4.6)

## 2013-06-29 LAB — GLUCOSE, CAPILLARY
Glucose-Capillary: 111 mg/dL — ABNORMAL HIGH (ref 70–99)
Glucose-Capillary: 97 mg/dL (ref 70–99)
Glucose-Capillary: 93 mg/dL (ref 70–99)

## 2013-06-29 LAB — TRIGLYCERIDES: Triglycerides: 182 mg/dL — ABNORMAL HIGH (ref <150)

## 2013-06-29 LAB — PREALBUMIN: Prealbumin: 14.8 mg/dL — ABNORMAL LOW (ref 17.0–34.0)

## 2013-06-29 LAB — MAGNESIUM: Magnesium: 2.4 mg/dL (ref 1.5–2.5)

## 2013-06-29 MED ORDER — MORPHINE SULFATE 4 MG/ML IJ SOLN: 3.0000 mg | INTRAMUSCULAR | Status: DC | PRN

## 2013-06-29 MED ORDER — POLYETHYLENE GLYCOL 3350 17 G PO PACK: 17.0000 g | PACK | Freq: Every day | ORAL | Status: DC

## 2013-06-29 NOTE — Progress Notes (Addendum)
Patient ID: [name] [name], male   DOB: [birth year], 52 y.o.   MRN: [id number] States he is hungry. BS dropped to 60 yesterday. Up to 89 after receiving sugar water. Says he feels better, however he is still receiving MS q 2 hrs.  Continues to have abdominal pain. Rates pain 6-6 1/2 just before pain medications. TPN infusing via PICC line rt arm.  Triglycerides 182. O.  Filed Vitals:   06/28/13 1300 06/28/13 1520 06/28/13 2105 06/29/13 0407  BP: 131/101  133/103 127/93  Pulse: 76  83 83  Temp: 97.7 F (36.5 C)  98 F (36.7 C) 97.9 F (36.6 C)  TempSrc: Oral  Oral Oral  Resp: 20  20 20   Height:      Weight:      SpO2: 98% 97% 96% 98%   CMP     Component Value Date/Time   NA 136 06/29/2013 0602   K 3.5 06/29/2013 0602   CL 102 06/29/2013 0602   CO2 27 06/29/2013 0602   GLUCOSE 105* 06/29/2013 0602   BUN 4* 06/29/2013 0602   CREATININE 0.82 06/29/2013 0602   CREATININE 0.88 04/08/2013 0843   CALCIUM 8.8 06/29/2013 0602   PROT 6.5 06/29/2013 0602   ALBUMIN 2.7* 06/29/2013 0602   AST 29 06/29/2013 0602   ALT 8 06/29/2013 0602   ALKPHOS 98 06/29/2013 0602   BILITOT 0.2* 06/29/2013 0602   GFRNONAA >90 06/29/2013 0602   GFRAA >90 06/29/2013 0602     Lungs are clear. HR is regular. Abdomen is soft. BS are positive. Tenderness left upper quadrant and rt mid abdomen. Assessment/Plan: Acute pancreatitis. Patient to remain NPO. Still receiving IV pain meds q 2 hrs. Stressed importance of remaining NPO.    GI attending note; [facility] He is tolerating TPN. Patient will need to be n.p.o. and should undergo repeat CT in 2 weeks. Feel free to page me at [phone] if you have any questions

## 2013-06-29 NOTE — Care Management Note (Signed)
    Page 1 of 1   06/29/2013     12:04:51 PM   CARE MANAGEMENT NOTE 06/29/2013  Patient:  [name] [name]   Account Number:  [id number]  Date Initiated:  06/29/2013  Documentation initiated by:  Rosemary Holms  Subjective/Objective Assessment:   Pt from home. Admitted with Pancreatitis requiring TPN and PICC line. Pt also needs some assistance with rehab from ETOH.     Action/Plan:   Spoke to pt regarding LTAC and he selected [facility]. Referral made for appropiateness.   Anticipated DC Date:  06/29/2013   Anticipated DC Plan:  LONG TERM ACUTE CARE (LTAC)      DC Planning Services  CM consult      Choice offered to / List presented to:             Status of service:  Completed, signed off Medicare Important Message given?   (If response is "NO", the following Medicare IM given date fields will be blank) Date Medicare IM given:   Date Additional Medicare IM given:    Discharge Disposition:  LONG TERM ACUTE CARE (LTAC)  Per UR Regulation:    If discussed at Long Length of Stay Meetings, dates discussed:    Comments:  06/29/13  11:30 Tricha Ruggirello Leanord Hawking RN BSN CM 12:00 Pt cleared by Hospitalist and GI to transfer to [facility]. Erika with [facility] transferring today by [facility]. RN Misty Stanley notified

## 2013-06-29 NOTE — Discharge Summary (Addendum)
Physician Discharge Summary  [name] [name] ZOX:[id number] DOB: June 15, 1961 DOA: 06/25/2013  PCP: Milinda Antis, MD  Admit date: 06/25/2013 Discharge date: 06/29/2013  Time spent: 35 minutes  Recommendations for Outpatient Follow-up:  1. Please follow up with GI in 2-3 weeks after CT scan  Recommendations - Please repeat a CT of the abdomen in 2 weeks to monitor pancreatic pseudocysts - Please continue TPN per pharmacy.  Discharge Diagnoses:  Active Problems:   Alcohol abuse   Pseudocyst of pancreas   Tobacco user   Transaminitis   Polysubstance abuse   Acute alcoholic pancreatitis  Discharge Condition: Stable  Diet recommendation: N.p.o. except ice chips, medications.   Filed Weights   06/25/13 0734  Weight: 83.915 kg (185 lb)   History of present illness:  [name] [name] is a 52 y.o. male has a past medical history significant for polysubstance abuse, including alcohol, hypertension, recent history of pancreatitis for which he was hospitalized here at the beginning of May, presents to the emergency room with a chief complaint of severe epigastric abdominal pain radiating into his back and left flank. He tells me today that after his discharge, he continued to be drinking at home, and has his birthday was just 2 weeks ago he is being continued to have heavy drinking. He states that he is able to eat, with minimal nausea no vomiting. Last meal was last night. Last alcoholic drink was yesterday. He denies any frank fevers or chills, however felt "cold" yesterday. Denies any diarrhea or constipation. He has no chest pain or breathing difficulties. He denies any headaches, lightheadedness or dizziness. In the emergency room, blood work shows normal lipase, minimally elevated liver function tests in a pattern consistent with alcohol consumption. CT scan of the abdomen showed interval development of loculated fluid collections within the pancreatic tail and left perinephric space.  Hospital  Course:  Abdominal pain - due to new findings on the CT scan; acute on chronic pancreatitis complicated by new pseudocysts; do not appear infected as patient is afebrile, not tachycardic and without leukocytosis. Patient's needs pancreatic rest for the next few weeks and GI followup. Meanwhile will use TPN as patient refuses [place] tube at this point. PICC line placed on 7/7 and started TPN. Continues to have abdominal pain requiring iv medication, but overall improving.  ETOH abuse - sober now, not triggering CIWA. Elevated LFTs - improving   Procedures:  none   Consultations:  Gastroenterology  Discharge Exam: Filed Vitals:   06/28/13 1300 06/28/13 1520 06/28/13 2105 06/29/13 0407  BP: 131/101  133/103 127/93  Pulse: 76  83 83  Temp: 97.7 F (36.5 C)  98 F (36.7 C) 97.9 F (36.6 C)  TempSrc: Oral  Oral Oral  Resp: 20  20 20   Height:      Weight:      SpO2: 98% 97% 96% 98%   General: No acute distress Cardiovascular: Regular rate and rhythm Respiratory: Clear to auscultation bilaterally  Discharge Instructions     Medication List         albuterol 108 (90 BASE) MCG/ACT inhaler  Commonly known as:  PROVENTIL HFA;VENTOLIN HFA  Inhale 2 puffs into the lungs every 4 (four) hours as needed for wheezing. For asthma/wheezing     amLODipine 5 MG tablet  Commonly known as:  NORVASC  Take 1 tablet (5 mg total) by mouth daily. For high blood pressure control     citalopram 10 MG tablet  Commonly known as:  CELEXA  Take 10  mg by mouth daily as needed (Anxiety).     fish oil-omega-3 fatty acids 1000 MG capsule  Take 1 capsule (1 g total) by mouth daily. For cholesterol control     gemfibrozil 600 MG tablet  Commonly known as:  LOPID  Take 1 tablet (600 mg total) by mouth 2 (two) times daily before a meal. Cholesterol control     morphine 4 MG/ML injection  Inject 0.75 mLs (3 mg total) into the vein every 2 (two) hours as needed for severe pain.     multivitamin with  minerals Tabs  Take 1 tablet by mouth daily.     omeprazole 20 MG capsule  Commonly known as:  PRILOSEC  Take 1 capsule (20 mg total) by mouth daily. For acid reflux     polyethylene glycol packet  Commonly known as:  MIRALAX / GLYCOLAX  Take 17 g by mouth daily.     prochlorperazine 10 MG tablet  Commonly known as:  COMPAZINE  Take 1 tablet by mouth daily as needed (nausea).     tamsulosin 0.4 MG Caps  Commonly known as:  FLOMAX  Take 1 capsule (0.4 mg total) by mouth daily. For enlarged prostate     thiamine 100 MG tablet  Take 1 tablet (100 mg total) by mouth daily.     traZODone 100 MG tablet  Commonly known as:  DESYREL  Take 1 tablet by mouth at bedtime as needed for sleep.      TPN per pharmacy   The results of significant diagnostics from this hospitalization (including imaging, microbiology, ancillary and laboratory) are listed below for reference.    Significant Diagnostic Studies: Ct Abdomen Pelvis W Contrast  06/25/2013   *RADIOLOGY REPORT*  Clinical Data: Epigastric pain, history of pancreatitis  CT ABDOMEN AND PELVIS WITH CONTRAST  Technique:  Multidetector CT imaging of the abdomen and pelvis was performed following the standard protocol during bolus administration of intravenous contrast.  Contrast: 50mL OMNIPAQUE IOHEXOL 300 MG/ML  SOLN, OMNIPAQUE IOHEXOL 300 MG/ML  SOLN  Comparison: Most recent prior CT abdomen/pelvis 04/29/2013  Findings:  Lower Chest:  Mild dependent atelectasis in the bilateral lower lobes.  The lung bases are otherwise clear.  Visualized heart within normal limits for size.  No pericardial effusion. Unremarkable distal thoracic esophagus.  Abdomen: Interval development of several circumscribed fluid collections in the previously identified region of inflammation in the pancreatic tail.  One fluid collections just superior to the right kidney measures 3.6 x 2.1 x 1.9 cm in transverse dimension. A second fluid collection in the lateral  perinephric space measures 2.6 x 2.6 x 3.8 cm. No evidence of gas within the fluid collections.  The fluid collections are loculated with thin peripheral enhancement.  There are persistent changes of inflammatory stranding in the surrounding peripancreatic and perinephric fat.  Secondary thickening of the posterior gastric wall, left pararenal and lateral conal fascia and splenic flexure of the colon. Persistent occlusion of the splenic vein with multiple splenic collaterals.  The splenic artery is not well seen given the portal venous phase timing of the study.  No definite pseudoaneurysm or hematoma identified.  No significant inflammation of the pancreatic neck, head or uncinate process.  Diffusely low attenuation of the hepatic parenchyma consistent with hepatic steatosis.  Small gallstones layer within the gallbladder lumen.  The gallbladder is minimally distended there is no significant gallbladder wall thickening or pericholecystic fluid by CT scan.  No intra or extrahepatic biliary ductal dilatation.  The  adrenal glands are within normal limits.  Symmetric parenchymal enhancement the bilateral kidneys.  No hydronephrosis or nephrolithiasis.  There is a small 1.7 x 0.9 mm developing subcapsular fluid collection along the lateral aspect of the left kidney in close approximation with the other perinephric fluid collections.  Normal-caliber large and small bowel.  No evidence of bowel obstruction.  As mentioned above, there is secondary reactive thickening of the colon in the region of the splenic flexure. Interval resolution of circumferential thickening of the distal rectum.  Decreased colonic ileus with improved dilatation of the cecum and ascending colon.  Pelvis: Small amount of free fluid in the rectovesicular recess which has decreased compared to prior.  The bladder appears unremarkable for the level of distension.  Unremarkable prostate gland and seminal vesicles.  Bones: No acute fracture or aggressive  appearing lytic or blastic osseous lesion.  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  Splenic artery not well seen.  No definite pseudoaneurysm.  IMPRESSION:  1.  Continued evolution of pancreatitis involving the pancreatic tail and distal body with interval development of loculated fluid collections within the pancreatic tail and left perinephric space. The fluid collections have thin peripheral enhancement but no internal gas.  Primary differential consideration is multi focal pancreatic pseudocyst.  The sterility these collections cannot be confirmed radiographically.  There is associated secondary inflammatory thickening the posterior gastric body, splenic flexure of the colon, anterior pararenal fascia and left lateral coronal fascia.  2.  Small developing subcapsular fluid collection along the lateral aspect of the left kidney likely reflecting a small satellite pancreatic pseudocyst.  Again, sterility of the fluid cannot be assessed radiographically.  3.  Stable appearance of chronic occlusion of the splenic vein with associated collateral formation.  4.  Cholelithiasis without cholecystitis by CT.  5.  Hepatic steatosis.  6. Compared to prior, interval resolution of pleural effusions, clearing of the lung bases, resolution of rectal wall thickening and cecal and ascending colonic ileus.  7.  Additional ancillary findings as above without significant interval change.   Original Report Authenticated By: Malachy Moan, M.D.   Dg Chest Port 1 View  06/28/2013   *RADIOLOGY REPORT*  Clinical Data: PICC line placement  PORTABLE CHEST - 1 VIEW  Comparison: 04/26/2013  Findings: Right arm PICC has its tip at the SVC/RA junction. Atelectasis is again seen in the lower lobes.  There is a small pleural effusion on the right.  IMPRESSION: Right arm PICC tip at the SVC/RA junction.   Original Report Authenticated By: Paulina Fusi, M.D.   Labs: Basic Metabolic Panel:  Recent  Labs Lab 06/25/13 1914 06/26/13 7829 06/27/13 5621 06/28/13 0611 06/29/13 0602  NA 135 137 138 136 136  K 3.4* 3.0* 3.2* 3.8 3.5  CL 97 102 102 100 102  CO2 26 27 29 26 27   GLUCOSE 184* 114* 92 92 105*  BUN 3* <3* 3* 3* 4*  CREATININE 0.74 0.70 0.77 0.74 0.82  CALCIUM 9.1 8.2* 8.6 9.2 8.8  MG  --   --   --  2.1 2.4  PHOS  --   --   --  3.9 4.2   Liver Function Tests:  Recent Labs Lab 06/25/13 0738 06/26/13 0605 06/28/13 0611 06/29/13 0602  AST 70* 37 25 29  ALT 13 13 10 8   ALKPHOS 131* 98 114 98  BILITOT 0.3 0.3 0.3 0.2*  PROT 7.7 6.2 7.5 6.5  ALBUMIN 3.3* 2.6* 3.0* 2.7*    Recent  Labs Lab 06/25/13 0738 06/26/13 0605  LIPASE 41  --   AMYLASE  --  49   CBC:  Recent Labs Lab 06/25/13 0738 06/26/13 0605 06/28/13 0611 06/29/13 0602  WBC 5.6 6.7 6.0 4.9  NEUTROABS 3.6  --   --  2.7  HGB 14.2 11.9* 13.2 12.3*  HCT 41.6 35.8* 39.7 36.2*  MCV 95.2 96.5 95.7 95.0  PLT 414* 403* 443* 463*   CBG:  Recent Labs Lab 06/28/13 2027 06/28/13 2125 06/28/13 2353 06/29/13 0618 06/29/13 1128  GLUCAP 89 84 103* 111* 97   Signed:  Michelyn Scullin  [facility] 06/29/2013, 12:04 PM

## 2013-06-29 NOTE — Progress Notes (Signed)
[facility] PROGRESS NOTE  [name] [name] ZOX:[id number] DOB: [birth year] DOA: 06/25/2013 PCP: Milinda Antis, MD  Narrative: [name] [name] is a 52 y.o. male has a past medical history significant for polysubstance abuse, including alcohol, hypertension, recent history of pancreatitis for which he was hospitalized here at the beginning of May, presents to the emergency room with a chief complaint of severe epigastric abdominal pain radiating into his back and left flank. CT scan of the abdomen showed interval development of loculated fluid collections within the pancreatic tail and left perinephric space.  Assessment/Plan:  Abdominal pain - due to new findings on the CT scan; acute on chronic pancreatitis complicated by new pseudocysts; do not appear infected as patient is afebrile, not tachycardic and without leukocytosis. GI following along and will use TPN as patient refuses [place] tube for few weeks. PICC line placed on 7/7 and started TPN. Continues to have abdominal pain requiring iv medication, but overall improving. Placement pending.  ETOH abuse - CIWA, not triggering. Elevated LFTs - improving DVT Prophylaxis - heparin s.q.  Code Status: Full Family Communication: none  Disposition Plan: inpatient, appreciate SW and CM with placement.   Consultants:  Gastroenterology  Procedures:  none  Antibiotics:  Anti-infectives   None     Antibiotics Given (last 72 hours)   None     HPI/Subjective: - still has abdominal "soreness". Not happy about NPO status but understands. OK with ice chips and occasional popsicle.   Objective: Filed Vitals:   06/28/13 1300 06/28/13 1520 06/28/13 2105 06/29/13 0407  BP: 131/101  133/103 127/93  Pulse: 76  83 83  Temp: 97.7 F (36.5 C)  98 F (36.7 C) 97.9 F (36.6 C)  TempSrc: Oral  Oral Oral  Resp: 20  20 20   Height:      Weight:      SpO2: 98% 97% 96% 98%    Intake/Output Summary (Last 24 hours) at 06/29/13 0950 Last data filed  at 06/29/13 0900  Gross per 24 hour  Intake 3204.33 ml  Output      0 ml  Net 3204.33 ml   Filed Weights   06/25/13 0734  Weight: 83.915 kg (185 lb)    Exam:   General:  NAD  Cardiovascular: regular rate and rhythm, without MRG  Respiratory: good air movement, clear to auscultation throughout, no wheezing, ronchi or rales  Abdomen: soft, tender to palpation, positive bowel sounds  MSK: no peripheral edema  Neuro: CN 2-12 grossly intact, MS 5/5 in all 4  Data Reviewed: Basic Metabolic Panel:  Recent Labs Lab 06/25/13 0738 06/26/13 0605 06/27/13 0620 06/28/13 0611 06/29/13 0602  NA 135 137 138 136 136  K 3.4* 3.0* 3.2* 3.8 3.5  CL 97 102 102 100 102  CO2 26 27 29 26 27   GLUCOSE 184* 114* 92 92 105*  BUN 3* <3* 3* 3* 4*  CREATININE 0.74 0.70 0.77 0.74 0.82  CALCIUM 9.1 8.2* 8.6 9.2 8.8  MG  --   --   --  2.1 2.4  PHOS  --   --   --  3.9 4.2   Liver Function Tests:  Recent Labs Lab 06/25/13 0738 06/26/13 0605 06/28/13 0611 06/29/13 0602  AST 70* 37 25 29  ALT 13 13 10 8   ALKPHOS 131* 98 114 98  BILITOT 0.3 0.3 0.3 0.2*  PROT 7.7 6.2 7.5 6.5  ALBUMIN 3.3* 2.6* 3.0* 2.7*    Recent Labs Lab 06/25/13 0738 06/26/13 0605  LIPASE 41  --  AMYLASE  --  49   CBC:  Recent Labs Lab 06/25/13 0738 06/26/13 0605 06/28/13 0611 06/29/13 0602  WBC 5.6 6.7 6.0 4.9  NEUTROABS 3.6  --   --  2.7  HGB 14.2 11.9* 13.2 12.3*  HCT 41.6 35.8* 39.7 36.2*  MCV 95.2 96.5 95.7 95.0  PLT 414* 403* 443* 463*   Studies: Dg Chest Port 1 View  06/28/2013   *RADIOLOGY REPORT*  Clinical Data: PICC line placement  PORTABLE CHEST - 1 VIEW  Comparison: 04/26/2013  Findings: Right arm PICC has its tip at the SVC/RA junction. Atelectasis is again seen in the lower lobes.  There is a small pleural effusion on the right.  IMPRESSION: Right arm PICC tip at the SVC/RA junction.   Original Report Authenticated By: Paulina Fusi, M.D.   Scheduled Meds: . bisacodyl  10 mg Oral Daily   . gemfibrozil  600 mg Oral BID AC  . heparin  5,000 Units Subcutaneous Q8H  . insulin aspart  0-9 Units Subcutaneous Q6H  . pantoprazole  40 mg Oral Daily  . polyethylene glycol  17 g Oral Daily  . sodium chloride  10-40 mL Intracatheter Q12H  . thiamine  100 mg Oral Daily   Or  . thiamine  100 mg Intravenous Daily   Continuous Infusions: . sodium chloride 100 mL/hr at 06/29/13 0217  . Marland KitchenTPN (CLINIMIX-E) Adult 40 mL/hr at 06/28/13 1936   And  . fat emulsion 250 mL (06/28/13 1800)   Active Problems:   Alcohol abuse   Pseudocyst of pancreas   Tobacco user   Transaminitis   Polysubstance abuse   Acute alcoholic pancreatitis  Time spent: 25  Pamella Pert, MD [facility] Pager [phone]. If 7 PM - 7 AM, please contact night-coverage at [email or web address], password [facility] 06/29/2013, 9:50 AM  LOS: 4 days

## 2013-06-30 ENCOUNTER — Other Ambulatory Visit (HOSPITAL_COMMUNITY): Payer: Self-pay

## 2013-06-30 LAB — URINALYSIS, ROUTINE W REFLEX MICROSCOPIC
Bilirubin Urine: NEGATIVE
Glucose, UA: NEGATIVE mg/dL
Hgb urine dipstick: NEGATIVE
Ketones, ur: NEGATIVE mg/dL
Leukocytes, UA: NEGATIVE
Nitrite: NEGATIVE
Protein, ur: NEGATIVE mg/dL
Specific Gravity, Urine: 1.005 (ref 1.005–1.030)
Urobilinogen, UA: 0.2 mg/dL (ref 0.0–1.0)
pH: 7 (ref 5.0–8.0)

## 2013-06-30 LAB — HEMOGLOBIN A1C
Hgb A1c MFr Bld: 5.6 % (ref <5.7)
Mean Plasma Glucose: 114 mg/dL (ref <117)

## 2013-06-30 LAB — CBC WITH DIFFERENTIAL/PLATELET
Basophils Absolute: 0 10*3/uL (ref 0.0–0.1)
Basophils Relative: 1 % (ref 0–1)
Eosinophils Absolute: 0.1 10*3/uL (ref 0.0–0.7)
Eosinophils Relative: 2 % (ref 0–5)
HCT: 34.6 % — ABNORMAL LOW (ref 39.0–52.0)
Hemoglobin: 11.5 g/dL — ABNORMAL LOW (ref 13.0–17.0)
Lymphocytes Relative: 33 % (ref 12–46)
Lymphs Abs: 1.7 10*3/uL (ref 0.7–4.0)
MCH: 31 pg (ref 26.0–34.0)
MCHC: 33.2 g/dL (ref 30.0–36.0)
MCV: 93.3 fL (ref 78.0–100.0)
Monocytes Absolute: 0.7 10*3/uL (ref 0.1–1.0)
Monocytes Relative: 14 % — ABNORMAL HIGH (ref 3–12)
Neutro Abs: 2.6 10*3/uL (ref 1.7–7.7)
Neutrophils Relative %: 50 % (ref 43–77)
Platelets: 443 10*3/uL — ABNORMAL HIGH (ref 150–400)
RBC: 3.71 MIL/uL — ABNORMAL LOW (ref 4.22–5.81)
RDW: 14.6 % (ref 11.5–15.5)
WBC: 5.1 10*3/uL (ref 4.0–10.5)

## 2013-06-30 LAB — TRIGLYCERIDES: Triglycerides: 266 mg/dL — ABNORMAL HIGH (ref <150)

## 2013-06-30 LAB — COMPREHENSIVE METABOLIC PANEL
ALT: 9 U/L (ref 0–53)
AST: 22 U/L (ref 0–37)
Albumin: 2.6 g/dL — ABNORMAL LOW (ref 3.5–5.2)
Alkaline Phosphatase: 89 U/L (ref 39–117)
BUN: 4 mg/dL — ABNORMAL LOW (ref 6–23)
CO2: 25 mEq/L (ref 19–32)
Calcium: 8.9 mg/dL (ref 8.4–10.5)
Chloride: 105 mEq/L (ref 96–112)
Creatinine, Ser: 0.84 mg/dL (ref 0.50–1.35)
GFR calc Af Amer: >90 mL/min (ref >90)
GFR calc non Af Amer: >90 mL/min (ref >90)
Glucose, Bld: 110 mg/dL — ABNORMAL HIGH (ref 70–99)
Potassium: 3.6 mEq/L (ref 3.5–5.1)
Sodium: 138 mEq/L (ref 135–145)
Total Bilirubin: 0.2 mg/dL — ABNORMAL LOW (ref 0.3–1.2)
Total Protein: 6.5 g/dL (ref 6.0–8.3)

## 2013-06-30 LAB — T3, FREE: T3, Free: 2.5 pg/mL (ref 2.3–4.2)

## 2013-06-30 LAB — PROCALCITONIN: Procalcitonin: <0.1 ng/mL

## 2013-06-30 LAB — T4, FREE: Free T4: 0.93 ng/dL (ref 0.80–1.80)

## 2013-06-30 LAB — AMYLASE: Amylase: 49 U/L (ref 0–105)

## 2013-06-30 LAB — PHOSPHORUS: Phosphorus: 4.3 mg/dL (ref 2.3–4.6)

## 2013-06-30 LAB — MAGNESIUM: Magnesium: 2.4 mg/dL (ref 1.5–2.5)

## 2013-06-30 LAB — LIPASE, BLOOD: Lipase: 31 U/L (ref 11–59)

## 2013-06-30 LAB — PREALBUMIN: Prealbumin: 15.3 mg/dL — ABNORMAL LOW (ref 17.0–34.0)

## 2013-06-30 LAB — TSH: TSH: 5.142 u[IU]/mL — ABNORMAL HIGH (ref 0.350–4.500)

## 2013-07-01 ENCOUNTER — Encounter (HOSPITAL_COMMUNITY): Payer: Self-pay | Admitting: Cardiology

## 2013-07-01 ENCOUNTER — Emergency Department (HOSPITAL_COMMUNITY)
Admission: EM | Admit: 2013-07-01 | Discharge: 2013-07-01 | Disposition: A | Discharge status: Home / Self Care | Payer: Medicare Other | Attending: Emergency Medicine | Admitting: Emergency Medicine

## 2013-07-01 DIAGNOSIS — K219 Gastro-esophageal reflux disease without esophagitis: Secondary | ICD-10-CM | POA: Insufficient documentation

## 2013-07-01 DIAGNOSIS — G8929 Other chronic pain: Secondary | ICD-10-CM | POA: Insufficient documentation

## 2013-07-01 DIAGNOSIS — Z862 Personal history of diseases of the blood and blood-forming organs and certain disorders involving the immune mechanism: Secondary | ICD-10-CM | POA: Insufficient documentation

## 2013-07-01 DIAGNOSIS — R109 Unspecified abdominal pain: Secondary | ICD-10-CM

## 2013-07-01 DIAGNOSIS — F1010 Alcohol abuse, uncomplicated: Secondary | ICD-10-CM | POA: Insufficient documentation

## 2013-07-01 DIAGNOSIS — I10 Essential (primary) hypertension: Secondary | ICD-10-CM | POA: Insufficient documentation

## 2013-07-01 DIAGNOSIS — Z8709 Personal history of other diseases of the respiratory system: Secondary | ICD-10-CM | POA: Insufficient documentation

## 2013-07-01 DIAGNOSIS — Z8781 Personal history of (healed) traumatic fracture: Secondary | ICD-10-CM | POA: Insufficient documentation

## 2013-07-01 DIAGNOSIS — Z8639 Personal history of other endocrine, nutritional and metabolic disease: Secondary | ICD-10-CM | POA: Insufficient documentation

## 2013-07-01 DIAGNOSIS — Z8719 Personal history of other diseases of the digestive system: Secondary | ICD-10-CM | POA: Insufficient documentation

## 2013-07-01 DIAGNOSIS — Z79899 Other long term (current) drug therapy: Secondary | ICD-10-CM | POA: Insufficient documentation

## 2013-07-01 DIAGNOSIS — Z8739 Personal history of other diseases of the musculoskeletal system and connective tissue: Secondary | ICD-10-CM | POA: Insufficient documentation

## 2013-07-01 DIAGNOSIS — F17200 Nicotine dependence, unspecified, uncomplicated: Secondary | ICD-10-CM | POA: Insufficient documentation

## 2013-07-01 DIAGNOSIS — Y838 Other surgical procedures as the cause of abnormal reaction of the patient, or of later complication, without mention of misadventure at the time of the procedure: Secondary | ICD-10-CM | POA: Insufficient documentation

## 2013-07-01 DIAGNOSIS — E785 Hyperlipidemia, unspecified: Secondary | ICD-10-CM | POA: Insufficient documentation

## 2013-07-01 LAB — BASIC METABOLIC PANEL
BUN: 4 mg/dL — ABNORMAL LOW (ref 6–23)
CO2: 23 mEq/L (ref 19–32)
Calcium: 9.4 mg/dL (ref 8.4–10.5)
Chloride: 102 mEq/L (ref 96–112)
Creatinine, Ser: 0.85 mg/dL (ref 0.50–1.35)
GFR calc Af Amer: >90 mL/min (ref >90)
GFR calc non Af Amer: >90 mL/min (ref >90)
Glucose, Bld: 109 mg/dL — ABNORMAL HIGH (ref 70–99)
Potassium: 3.5 mEq/L (ref 3.5–5.1)
Sodium: 139 mEq/L (ref 135–145)

## 2013-07-01 LAB — CBC
HCT: 36.3 % — ABNORMAL LOW (ref 39.0–52.0)
Hemoglobin: 11.9 g/dL — ABNORMAL LOW (ref 13.0–17.0)
MCH: 31.2 pg (ref 26.0–34.0)
MCHC: 32.8 g/dL (ref 30.0–36.0)
MCV: 95.3 fL (ref 78.0–100.0)
Platelets: 493 10*3/uL — ABNORMAL HIGH (ref 150–400)
RBC: 3.81 MIL/uL — ABNORMAL LOW (ref 4.22–5.81)
RDW: 15.2 % (ref 11.5–15.5)
WBC: 5 10*3/uL (ref 4.0–10.5)

## 2013-07-01 LAB — URINE CULTURE
Colony Count: NO GROWTH
Culture: NO GROWTH

## 2013-07-01 LAB — LIPASE, BLOOD: Lipase: 30 U/L (ref 11–59)

## 2013-07-01 LAB — PREALBUMIN: Prealbumin: 17.5 mg/dL — ABNORMAL LOW (ref 17.0–34.0)

## 2013-07-01 MED ORDER — OXYCODONE-ACETAMINOPHEN 5-325 MG PO TABS
2.0000 | ORAL_TABLET | Freq: Once | ORAL | Status: AC
  Administered 2013-07-01: 2 via ORAL
  Filled 2013-07-01: qty 2

## 2013-07-01 NOTE — ED Notes (Addendum)
PER MIKE WITH [facility]. PT FAMILY CAN TRANSPORT HIM TO [facility]

## 2013-07-01 NOTE — ED Provider Notes (Signed)
History    CSN: [id number] Arrival date & time 07/01/13  0941  First MD Initiated Contact with Patient 07/01/13 1040     Chief Complaint  Patient presents with  . Vascular Access Problem   (Consider location/radiation/quality/duration/timing/severity/associated sxs/prior Treatment) HPI Comments: Pt w/ chronic ETOH pancreatitis was in LTAC at [place] today - became upset b/c not receiving pain meds as scheduled and requested transfer to [facility]. Staff pulled out PICC line for d/c. Pt now in ED requesting PICC line placement prior to going to [facility]. Admits to mild epigastric soreness. Denies n/v/d/c or fever.  Patient is a 52 y.o. male presenting with general illness. The history is provided by the patient. No language interpreter was used.  Illness Location:  GI Quality:  Abd pain Severity:  Moderate Onset quality:  Gradual Timing:  Constant Progression:  Improving Chronicity:  Recurrent Associated symptoms: abdominal pain   Associated symptoms: no chest pain, no congestion, no cough, no diarrhea, no fever, no headaches, no nausea, no rash, no shortness of breath, no sore throat and no vomiting    Past Medical History  Diagnosis Date  . Bronchitis   . Acid reflux   . Pancreatitis     March 2013  . Gout   . Bipolar disorder   . Hyperlipidemia   . Alcohol abuse     6-8 cans of beer daily  . Pseudocyst of pancreas 02/28/2012  . Arthritis   . Fracture of lower leg   . Hypertension   . Chronic pain   . Chronic neck pain   . Chronic back pain   . Left arm pain     chronic   Past Surgical History  Procedure Laterality Date  . Nose surgery      broken nose  . Circumcision  03/17/2012    Procedure: CIRCUMCISION ADULT;  Surgeon: Ky Barban, MD;  Location: [facility];  Service: Urology;  Laterality: N/A;  . Colonoscopy with propofol  12/24/2012    Procedure: COLONOSCOPY WITH PROPOFOL;  Surgeon: Corbin Ade, MD;  Location: [facility];  Service: Endoscopy;   Laterality: N/A;  started at 0803, in cecum at 0812, withdrawel time Biopsy of anal lesion  . Esophagogastroduodenoscopy (egd) with propofol  12/24/2012    Procedure: ESOPHAGOGASTRODUODENOSCOPY (EGD) WITH PROPOFOL;  Surgeon: Corbin Ade, MD;  Location: [facility];  Service: Endoscopy;  Laterality: N/A;  done at 0800   Family History  Problem Relation Age of Onset  . Diabetes Father   . Anesthesia problems Neg Hx   . Hypotension Neg Hx   . Malignant hyperthermia Neg Hx   . Pseudochol deficiency Neg Hx   . Colon cancer Neg Hx    History  Substance Use Topics  . Smoking status: Current Every Day Smoker -- 0.50 packs/day for 25 years    Types: Cigarettes  . Smokeless tobacco: Not on file  . Alcohol Use: 0.0 oz/week     Comment: 6-8 beers a day    Review of Systems  Constitutional: Negative for fever and chills.  HENT: Negative for congestion and sore throat.   Respiratory: Negative for cough and shortness of breath.   Cardiovascular: Negative for chest pain and leg swelling.  Gastrointestinal: Positive for abdominal pain. Negative for nausea, vomiting, diarrhea and constipation.  Genitourinary: Negative for dysuria and frequency.  Skin: Negative for color change and rash.  Neurological: Negative for dizziness and headaches.  Psychiatric/Behavioral: Negative for confusion and agitation.  All other systems  reviewed and are negative.    Allergies  Penicillins  Home Medications   Current Outpatient Rx  Name  Route  Sig  Dispense  Refill  . albuterol (PROVENTIL HFA;VENTOLIN HFA) 108 (90 BASE) MCG/ACT inhaler   Inhalation   Inhale 2 puffs into the lungs every 4 (four) hours as needed for wheezing. For asthma/wheezing   1 Inhaler   2   . amLODipine (NORVASC) 5 MG tablet   Oral   Take 1 tablet (5 mg total) by mouth daily. For high blood pressure control   30 tablet   0   . citalopram (CELEXA) 10 MG tablet   Oral   Take 10 mg by mouth daily as needed (Anxiety).           . fish oil-omega-3 fatty acids 1000 MG capsule   Oral   Take 1 capsule (1 g total) by mouth daily. For cholesterol control         . gemfibrozil (LOPID) 600 MG tablet   Oral   Take 1 tablet (600 mg total) by mouth 2 (two) times daily before a meal. Cholesterol control   60 tablet   3   . morphine 4 MG/ML injection   Intravenous   Inject 0.75 mLs (3 mg total) into the vein every 2 (two) hours as needed for severe pain.   1 mL   0   . Multiple Vitamin (MULTIVITAMIN WITH MINERALS) TABS   Oral   Take 1 tablet by mouth daily.         Marland Kitchen omeprazole (PRILOSEC) 20 MG capsule   Oral   Take 1 capsule (20 mg total) by mouth daily. For acid reflux   30 capsule   3   . polyethylene glycol (MIRALAX / GLYCOLAX) packet   Oral   Take 17 g by mouth daily.   14 each   0   . prochlorperazine (COMPAZINE) 10 MG tablet   Oral   Take 1 tablet by mouth daily as needed (nausea).          . tamsulosin (FLOMAX) 0.4 MG CAPS   Oral   Take 1 capsule (0.4 mg total) by mouth daily. For enlarged prostate   30 capsule   6   . thiamine 100 MG tablet   Oral   Take 1 tablet (100 mg total) by mouth daily.   30 tablet   0   . traZODone (DESYREL) 100 MG tablet   Oral   Take 1 tablet by mouth at bedtime as needed for sleep.           BP 134/92  Pulse 77  Temp(Src) 97.8 F (36.6 C) (Oral)  Resp 16  SpO2 100% Physical Exam  Constitutional: He is oriented to person, place, and time. He appears well-developed and well-nourished. No distress.  HENT:  Head: Normocephalic and atraumatic.  Eyes: EOM are normal. Pupils are equal, round, and reactive to light.  Neck: Normal range of motion. Neck supple.  Cardiovascular: Normal rate and regular rhythm.   Pulmonary/Chest: Effort normal and breath sounds normal. No respiratory distress.  Abdominal: Soft. He exhibits no distension. There is no hepatosplenomegaly. There is tenderness in the epigastric area and periumbilical area. There is no  rigidity, no rebound, no guarding, no CVA tenderness, no tenderness at McBurney's point and negative Murphy's sign.    Musculoskeletal: Normal range of motion. He exhibits no edema.  Neurological: He is alert and oriented to person, place, and time.  Skin: Skin is  warm and dry.  Psychiatric: He has a normal mood and affect. His behavior is normal.    ED Course  Procedures (including critical care time) Labs Reviewed  LIPASE, BLOOD   Results for orders placed during the hospital encounter of 07/01/13  LIPASE, BLOOD      Result Value Range   Lipase 30  11 - 59 U/L    Dg Chest Port 1 View  06/30/2013   *RADIOLOGY REPORT*  Clinical Data: Pancreatitis.  Left abdomen pain.  PORTABLE CHEST - 1 VIEW  Comparison: 06/28/2013.  Findings: PICC line tip remains at the cavoatrial junction.  Low lung volumes with bibasilar subsegmental atelectasis and small effusions persist.  This accentuates the heart size which is otherwise normal.  IMPRESSION: Stable exam.   Original Report Authenticated By: Davonna Belling, M.D.   Dg Abd Portable 1v  06/30/2013   *RADIOLOGY REPORT*  Clinical Data: Pancreatitis.  Abdominal pain  PORTABLE ABDOMEN - 1 VIEW  Comparison: CT 06/25/2013.  KUB 04/30/2013  Findings: Oral contrast in the rectum and colon from recent CT. Negative for bowel obstruction.  There is gas in large and small bowel consistent with mild ileus.  No abnormal soft tissue calcifications.  IMPRESSION: Mild ileus.   Original Report Authenticated By: Janeece Riggers, M.D.   No diagnosis found.  MDM  Exam as above, abd soft and benign. Will repeat lipase and d/w case mgr for further guidance in placement.  Course: lipase normal, vitals stable, case manager able to obtain acceptance at [facility] w/ Dr Nelson Chimes accepting. Pt discharged by POV to go to [facility] and they will place PICC line there. D/c in good and stable condition.   1. Abdominal  pain, other specified site      Audelia Hives, MD 07/01/13 1554

## 2013-07-01 NOTE — ED Provider Notes (Signed)
I saw and evaluated the patient, reviewed the resident's note and I agree with the findings and plan. If applicable, I agree with the resident's interpretation of the EKG.  If applicable, I was present for critical portions of any procedures performed.  Recent admit for pancreatitis and pseudocysts, left LTAC today, wants PICC replaced for TPN.  States pain is controlled and no vomiting. Mild epigastric tenderness. No fever, leukocytosis, tachycardia.  Glynn Octave, MD 07/01/13 [phone]

## 2013-07-01 NOTE — ED Notes (Signed)
Pt reports that he was on the 5th floor and giving TPN through a PICC line. Reports that he told them he wanted to be discharged and have no further treatment so his PICC was pulled and he left to come down here. Reports that he now wants the PICC line back in and to go to [facility] for treatment. States he was admitted here for pancreatitis. No distress noted at triage.

## 2013-07-02 ENCOUNTER — Telehealth: Payer: Self-pay | Admitting: Gastroenterology

## 2013-07-02 NOTE — Progress Notes (Signed)
   CARE MANAGEMENT ED NOTE 07/02/2013  Patient:  Liddicoat,Khoury   Account Number:  [id number]  Date Initiated:  07/01/2013  Documentation initiated by:  Fransico Michael  Subjective/Objective Assessment:   c/o vascular access problems     Subjective/Objective Assessment Detail:     Action/Plan:   disposition to ltac   Action/Plan Detail:   Anticipated DC Date:  07/01/2013     Status Recommendation to Physician:   Result of Recommendation:      DC Planning Services  CM consult   PAC Choice  LONG TERM ACUTE CARE   Choice offered to / List presented to:            Status of service:  Completed, signed off  ED Comments:   ED Comments Detail:  07/01/13-1432-J.Keamber Macfadden,RN,BSN [phone]     Kathlene November with [facility] in to see [facility]. Confirms that [facility] will accept patient. [facility] to notifiy RN at [phone] when bed assignment made. Dr. Gary Fleet notified by [facility]. No further CM needs noted. Patient to be discharged this afternoon.  07/01/13-1206-J.Saranda Legrande,RN,BSN [phone]      Demographics sheet and H&P faxed to [facility] at [phone] per request of Kathlene November, liasion.  07/01/13-1153-J.Kendle Erker,RN,BSN [phone]      Received referral from Dr. Gary Fleet, EDP regarding need for LTAC placement. Reports that " patient was discharged to [facility] on 7/8 and got mad today because he was not receiving his pain medications on time. Patient voiced desire to go to [facility] and so staff d/c'd his PICC line and patient checked himself out AMA. Now he is here and requesting atht we reinsert his PICC line and get him admitted to [facility]. Kathlene November with [facility] notified of potential referral.

## 2013-07-02 NOTE — Telephone Encounter (Signed)
Needs OV in 4-6 weeks, E30 f/u pancreatitis.

## 2013-07-06 NOTE — Telephone Encounter (Signed)
Pt is aware of OV on 8/13 at 2 with [facility] and appt card was mailed

## 2013-07-21 ENCOUNTER — Encounter: Payer: Self-pay | Admitting: Family Medicine

## 2013-07-21 ENCOUNTER — Ambulatory Visit (INDEPENDENT_AMBULATORY_CARE_PROVIDER_SITE_OTHER): Payer: Medicare Other | Admitting: Family Medicine

## 2013-07-21 VITALS — BP 110/70 | HR 78 | Temp 98.5°F | Resp 18 | Wt 180.0 lb

## 2013-07-21 DIAGNOSIS — I10 Essential (primary) hypertension: Secondary | ICD-10-CM

## 2013-07-21 DIAGNOSIS — R229 Localized swelling, mass and lump, unspecified: Secondary | ICD-10-CM

## 2013-07-21 DIAGNOSIS — E781 Pure hyperglyceridemia: Secondary | ICD-10-CM

## 2013-07-21 DIAGNOSIS — K852 Alcohol induced acute pancreatitis without necrosis or infection: Secondary | ICD-10-CM

## 2013-07-21 DIAGNOSIS — K859 Acute pancreatitis without necrosis or infection, unspecified: Secondary | ICD-10-CM

## 2013-07-21 DIAGNOSIS — F1010 Alcohol abuse, uncomplicated: Secondary | ICD-10-CM

## 2013-07-21 MED ORDER — GEMFIBROZIL 600 MG PO TABS: 600.0000 mg | ORAL_TABLET | Freq: Two times a day (BID) | ORAL | Status: DC

## 2013-07-21 MED ORDER — PROCHLORPERAZINE MALEATE 10 MG PO TABS: 10.0000 mg | ORAL_TABLET | Freq: Every day | ORAL | Status: DC | PRN

## 2013-07-21 MED ORDER — AMLODIPINE BESYLATE 5 MG PO TABS: 5.0000 mg | ORAL_TABLET | Freq: Every day | ORAL | Status: DC

## 2013-07-21 MED ORDER — TAMSULOSIN HCL 0.4 MG PO CAPS: 0.4000 mg | ORAL_CAPSULE | Freq: Every day | ORAL | Status: DC

## 2013-07-21 MED ORDER — CITALOPRAM HYDROBROMIDE 10 MG PO TABS: 10.0000 mg | ORAL_TABLET | Freq: Every day | ORAL | Status: DC | PRN

## 2013-07-21 MED ORDER — OMEPRAZOLE 20 MG PO CPDR: 20.0000 mg | DELAYED_RELEASE_CAPSULE | Freq: Every day | ORAL | Status: DC

## 2013-07-21 MED ORDER — TRAZODONE HCL 150 MG PO TABS: 150.0000 mg | ORAL_TABLET | Freq: Every day | ORAL | Status: DC

## 2013-07-21 NOTE — Assessment & Plan Note (Signed)
Continue norvasc, meds refilled

## 2013-07-21 NOTE — Assessment & Plan Note (Signed)
Unchanged, continues to drink despite recurrent pancreatitis

## 2013-07-21 NOTE — Assessment & Plan Note (Signed)
Restart lopid, obtain d/c summary and labs from [facility]

## 2013-07-21 NOTE — Assessment & Plan Note (Signed)
Small pea size nodules differentials cyst or fatty lipoma, will monitor for now

## 2013-07-21 NOTE — Patient Instructions (Signed)
You can use vaseline or oilive oil on the outside of ear Medications refilled We will let you know if referral to surgeon  August 13th 2pm - Dr. Karilyn Cota  STOP DRINKING! Call your psychiatrist for appt F/U 3 months

## 2013-07-21 NOTE — Progress Notes (Signed)
  Subjective:    Patient ID: [name] [name], male    DOB: 07-Dec-1961, 52 y.o.   MRN: [id number]  HPI  Pt here to f/u recent hospital admission for pancreatitis, he is asking for referral to have gallstones taken out. He was admitted for recurrent pancreatitis due to ETOH use. He was sent to [facility] then [facility] for 2 weeks for continued care and TPN. He was discharged home on Friday. He has had ETOH since discharge, but denies illicit drug use. No current abdominal pain, but feels 2 small knots under his skin.   Medications reviewed, trazodone was increased to 150mg  at bedtime. He is taking all over meds as previous. Note given morphine per report at discharge he has been taking, but did not bring this bottle with him today.  Review of Systems  GEN- denies fatigue, fever, weight loss,weakness, recent illness HEENT- denies eye drainage, change in vision, nasal discharge, CVS- denies chest pain, palpitations RESP- denies SOB, cough, wheeze ABD- denies N/V, change in stools, abd pain GU- denies dysuria, hematuria, dribbling, incontinence MSK- + joint pain, muscle aches, injury Neuro- denies headache, dizziness, syncope, seizure activity      Objective:   Physical Exam GEN- NAD, alert and oriented x3 HEENT- PERRL, EOMI, non injected sclera, pink conjunctiva, MMM, oropharynx clear CVS- RRR, no murmur RESP-CTAB ABD-NABS,soft,NT,ND, no HSM EXT- No edema Pulses- Radial 2+ Psych- not anxious or depressed appearing, not intoxicated  Skin- small subcutaenous pea size nodules felt on left side beneath ribs and RLQ      Assessment & Plan:

## 2013-07-21 NOTE — Assessment & Plan Note (Signed)
Now resolved, doubt gallstones playing a role, I have reached out to his gastroenterologist, to see if referral is needed to general surgery

## 2013-07-27 ENCOUNTER — Telehealth: Payer: Self-pay | Admitting: Family Medicine

## 2013-07-27 NOTE — Telephone Encounter (Signed)
Please let [name] [name] know he needs to f/u with Dr. Jena Gauss on 8/13 for recheck before decicsion is made regarding gallbladder removal. If needed they will set up HIs appt is 8/13 at 2pm

## 2013-07-29 NOTE — Telephone Encounter (Signed)
Kim called 07/27/13 with no answer, today called no answer

## 2013-07-30 NOTE — Telephone Encounter (Signed)
Pt is aware of message

## 2013-08-03 ENCOUNTER — Encounter: Payer: Self-pay | Admitting: Internal Medicine

## 2013-08-03 ENCOUNTER — Emergency Department (HOSPITAL_COMMUNITY)
Admission: EM | Admit: 2013-08-03 | Discharge: 2013-08-03 | Disposition: A | Discharge status: Home / Self Care | Payer: Medicare Other | Attending: Emergency Medicine | Admitting: Emergency Medicine

## 2013-08-03 DIAGNOSIS — F17200 Nicotine dependence, unspecified, uncomplicated: Secondary | ICD-10-CM | POA: Insufficient documentation

## 2013-08-03 DIAGNOSIS — K219 Gastro-esophageal reflux disease without esophagitis: Secondary | ICD-10-CM | POA: Insufficient documentation

## 2013-08-03 DIAGNOSIS — Z862 Personal history of diseases of the blood and blood-forming organs and certain disorders involving the immune mechanism: Secondary | ICD-10-CM | POA: Insufficient documentation

## 2013-08-03 DIAGNOSIS — Z8781 Personal history of (healed) traumatic fracture: Secondary | ICD-10-CM | POA: Insufficient documentation

## 2013-08-03 DIAGNOSIS — I10 Essential (primary) hypertension: Secondary | ICD-10-CM | POA: Insufficient documentation

## 2013-08-03 DIAGNOSIS — K859 Acute pancreatitis without necrosis or infection, unspecified: Secondary | ICD-10-CM

## 2013-08-03 DIAGNOSIS — Z8709 Personal history of other diseases of the respiratory system: Secondary | ICD-10-CM | POA: Insufficient documentation

## 2013-08-03 DIAGNOSIS — G8929 Other chronic pain: Secondary | ICD-10-CM | POA: Insufficient documentation

## 2013-08-03 DIAGNOSIS — R197 Diarrhea, unspecified: Secondary | ICD-10-CM | POA: Insufficient documentation

## 2013-08-03 DIAGNOSIS — K852 Alcohol induced acute pancreatitis without necrosis or infection: Secondary | ICD-10-CM

## 2013-08-03 DIAGNOSIS — Z8719 Personal history of other diseases of the digestive system: Secondary | ICD-10-CM | POA: Insufficient documentation

## 2013-08-03 DIAGNOSIS — Z880 Allergy status to penicillin: Secondary | ICD-10-CM | POA: Insufficient documentation

## 2013-08-03 DIAGNOSIS — Z8739 Personal history of other diseases of the musculoskeletal system and connective tissue: Secondary | ICD-10-CM | POA: Insufficient documentation

## 2013-08-03 DIAGNOSIS — R112 Nausea with vomiting, unspecified: Secondary | ICD-10-CM | POA: Insufficient documentation

## 2013-08-03 DIAGNOSIS — Z8639 Personal history of other endocrine, nutritional and metabolic disease: Secondary | ICD-10-CM | POA: Insufficient documentation

## 2013-08-03 DIAGNOSIS — Z79899 Other long term (current) drug therapy: Secondary | ICD-10-CM | POA: Insufficient documentation

## 2013-08-03 DIAGNOSIS — F1021 Alcohol dependence, in remission: Secondary | ICD-10-CM | POA: Insufficient documentation

## 2013-08-03 DIAGNOSIS — E785 Hyperlipidemia, unspecified: Secondary | ICD-10-CM | POA: Insufficient documentation

## 2013-08-03 DIAGNOSIS — Z8659 Personal history of other mental and behavioral disorders: Secondary | ICD-10-CM | POA: Insufficient documentation

## 2013-08-03 LAB — CBC WITH DIFFERENTIAL/PLATELET
Basophils Absolute: 0 10*3/uL (ref 0.0–0.1)
Basophils Relative: 0 % (ref 0–1)
Eosinophils Absolute: 0.1 10*3/uL (ref 0.0–0.7)
Eosinophils Relative: 2 % (ref 0–5)
HCT: 39.5 % (ref 39.0–52.0)
Hemoglobin: 13.4 g/dL (ref 13.0–17.0)
Lymphocytes Relative: 20 % (ref 12–46)
Lymphs Abs: 1.4 10*3/uL (ref 0.7–4.0)
MCH: 31.1 pg (ref 26.0–34.0)
MCHC: 33.9 g/dL (ref 30.0–36.0)
MCV: 91.6 fL (ref 78.0–100.0)
Monocytes Absolute: 0.4 10*3/uL (ref 0.1–1.0)
Monocytes Relative: 6 % (ref 3–12)
Neutro Abs: 5 10*3/uL (ref 1.7–7.7)
Neutrophils Relative %: 72 % (ref 43–77)
Platelets: 510 10*3/uL — ABNORMAL HIGH (ref 150–400)
RBC: 4.31 MIL/uL (ref 4.22–5.81)
RDW: 14.1 % (ref 11.5–15.5)
WBC: 6.9 10*3/uL (ref 4.0–10.5)

## 2013-08-03 LAB — COMPREHENSIVE METABOLIC PANEL
ALT: <5 U/L (ref 0–53)
AST: 12 U/L (ref 0–37)
Albumin: 3.2 g/dL — ABNORMAL LOW (ref 3.5–5.2)
Alkaline Phosphatase: 65 U/L (ref 39–117)
BUN: 7 mg/dL (ref 6–23)
CO2: 27 mEq/L (ref 19–32)
Calcium: 9.5 mg/dL (ref 8.4–10.5)
Chloride: 100 mEq/L (ref 96–112)
Creatinine, Ser: 0.77 mg/dL (ref 0.50–1.35)
GFR calc Af Amer: >90 mL/min (ref >90)
GFR calc non Af Amer: >90 mL/min (ref >90)
Glucose, Bld: 123 mg/dL — ABNORMAL HIGH (ref 70–99)
Potassium: 3.3 mEq/L — ABNORMAL LOW (ref 3.5–5.1)
Sodium: 137 mEq/L (ref 135–145)
Total Bilirubin: 0.2 mg/dL — ABNORMAL LOW (ref 0.3–1.2)
Total Protein: 7.8 g/dL (ref 6.0–8.3)

## 2013-08-03 LAB — LIPASE, BLOOD: Lipase: 211 U/L — ABNORMAL HIGH (ref 11–59)

## 2013-08-03 MED ORDER — HYDROMORPHONE HCL PF 1 MG/ML IJ SOLN
0.5000 mg | Freq: Once | INTRAMUSCULAR | Status: AC
  Administered 2013-08-03: 0.5 mg via INTRAVENOUS
  Filled 2013-08-03: qty 1

## 2013-08-03 MED ORDER — ONDANSETRON HCL 4 MG/2ML IJ SOLN
4.0000 mg | Freq: Once | INTRAMUSCULAR | Status: AC
  Administered 2013-08-03: 4 mg via INTRAVENOUS
  Filled 2013-08-03: qty 2

## 2013-08-03 MED ORDER — PROCHLORPERAZINE MALEATE 10 MG PO TABS: 10.0000 mg | ORAL_TABLET | Freq: Every day | ORAL | Status: DC | PRN

## 2013-08-03 MED ORDER — HYDROMORPHONE HCL PF 1 MG/ML IJ SOLN
INTRAMUSCULAR | Status: AC
  Administered 2013-08-03: 1 mg via INTRAVENOUS
  Filled 2013-08-03: qty 1

## 2013-08-03 MED ORDER — HYDROMORPHONE HCL PF 1 MG/ML IJ SOLN: 1.0000 mg | Freq: Once | INTRAMUSCULAR | Status: AC

## 2013-08-03 MED ORDER — SODIUM CHLORIDE 0.9 % IV BOLUS (SEPSIS)
1000.0000 mL | Freq: Once | INTRAVENOUS | Status: AC
  Administered 2013-08-03: 1000 mL via INTRAVENOUS

## 2013-08-03 MED ORDER — OXYCODONE HCL 5 MG PO TABS: 5.0000 mg | ORAL_TABLET | ORAL | Status: DC | PRN

## 2013-08-03 NOTE — ED Notes (Signed)
Patient with no complaints at this time. Respirations even and unlabored. Skin warm/dry. Discharge instructions reviewed with patient at this time. Patient given opportunity to voice concerns/ask questions. IV removed per policy and band-aid applied to site. Patient discharged at this time and left Emergency Department with steady gait.  

## 2013-08-03 NOTE — ED Notes (Signed)
Abdominal pain beginning Sunday.  Had some diarrhea Sat. Night, all day Sunday and 2 x Monday.  Nauseated but vomited only 1 x Sunday AM.  Last normal BM was yesterday AM.  No blood noted in stool or urine.  No dysuria.

## 2013-08-03 NOTE — ED Notes (Signed)
Went in to check on pain level, patient eating hamburger brought in by significant other.

## 2013-08-03 NOTE — ED Provider Notes (Signed)
CSN: [id number]     Arrival date & time 08/03/13  1038 History  This chart was scribed for Donnetta Hutching, MD, by Yevette Edwards, ED Scribe. This patient was seen in room [facility] and the patient's care was started at 12:04 PM.   First MD Initiated Contact with Patient 08/03/13 1121     Chief Complaint  Patient presents with  . Abdominal Pain   (Consider location/radiation/quality/duration/timing/severity/associated sxs/prior Treatment) The history is provided by the patient. No language interpreter was used.   HPI Comments: Prithvi Kooi is a 52 y.o. male, with a h/o pancreatitis, who presents to the Emergency Department complaining of lower quadrant abdominal pain which began two days ago. He experienced diarrhea as well as nausea and one episode of emesis which occurred two days ago. He has been eating "some" and urinating normally. His last normal BM was yesterday morning. The pt states that his current symptoms are similar to prior episodes of pancreatitis. His last admission for similar symptoms was approximately one month ago. He denies experiencing any hematuria, hematochezia, or dysuria. The pt has a h/o HTN and chronic pain. The pt is a smoker, and he has a h/o alcohol abuse.   Dr. Jeanice Lim is the pt's PCP.   Past Medical History  Diagnosis Date  . Bronchitis   . Acid reflux   . Pancreatitis     March 2013  . Gout   . Bipolar disorder   . Hyperlipidemia   . Alcohol abuse     6-8 cans of beer daily  . Pseudocyst of pancreas 02/28/2012  . Arthritis   . Fracture of lower leg   . Hypertension   . Chronic pain   . Chronic neck pain   . Chronic back pain   . Left arm pain     chronic   Past Surgical History  Procedure Laterality Date  . Nose surgery      broken nose  . Circumcision  03/17/2012    Procedure: CIRCUMCISION ADULT;  Surgeon: Ky Barban, MD;  Location: [facility];  Service: Urology;  Laterality: N/A;  . Colonoscopy with propofol  12/24/2012    NWG:NFAO lesion as  described above status post biopsy; otherwise normal rectum/Sigmoid diverticulosis/Sigmoid polyps -- resected as described above  . Esophagogastroduodenoscopy (egd) with propofol  12/24/2012    ZHY:QMVHQIO erythema and erosions of uncertain significance status post gastric biopsy   Family History  Problem Relation Age of Onset  . Diabetes Father   . Anesthesia problems Neg Hx   . Hypotension Neg Hx   . Malignant hyperthermia Neg Hx   . Pseudochol deficiency Neg Hx   . Colon cancer Neg Hx    History  Substance Use Topics  . Smoking status: Current Every Day Smoker -- 0.50 packs/day for 25 years    Types: Cigarettes  . Smokeless tobacco: Not on file  . Alcohol Use: 0.0 oz/week     Comment: 6-8 beers a day    Review of Systems  Gastrointestinal: Positive for nausea, vomiting, abdominal pain and diarrhea.  Genitourinary: Negative for dysuria and hematuria.  All other systems reviewed and are negative.    Allergies  Penicillins  Home Medications   Current Outpatient Rx  Name  Route  Sig  Dispense  Refill  . albuterol (PROVENTIL HFA;VENTOLIN HFA) 108 (90 BASE) MCG/ACT inhaler   Inhalation   Inhale 2 puffs into the lungs every 4 (four) hours as needed for wheezing. For asthma/wheezing   1 Inhaler  2   . amLODipine (NORVASC) 5 MG tablet   Oral   Take 1 tablet (5 mg total) by mouth daily. For high blood pressure control   30 tablet   3   . citalopram (CELEXA) 10 MG tablet   Oral   Take 1 tablet (10 mg total) by mouth daily as needed (Anxiety).   30 tablet   3   . fish oil-omega-3 fatty acids 1000 MG capsule   Oral   Take 1 capsule (1 g total) by mouth daily. For cholesterol control         . gemfibrozil (LOPID) 600 MG tablet   Oral   Take 1 tablet (600 mg total) by mouth 2 (two) times daily before a meal. Cholesterol control   60 tablet   3   . Multiple Vitamin (MULTIVITAMIN WITH MINERALS) TABS   Oral   Take 1 tablet by mouth daily.         Marland Kitchen omeprazole  (PRILOSEC) 20 MG capsule   Oral   Take 1 capsule (20 mg total) by mouth daily. For acid reflux   30 capsule   3   . polyethylene glycol (MIRALAX / GLYCOLAX) packet   Oral   Take 17 g by mouth daily.   14 each   0   . prochlorperazine (COMPAZINE) 10 MG tablet   Oral   Take 1 tablet (10 mg total) by mouth daily as needed (nausea).   30 tablet   1   . tamsulosin (FLOMAX) 0.4 MG CAPS   Oral   Take 1 capsule (0.4 mg total) by mouth daily. For enlarged prostate   30 capsule   6   . thiamine 100 MG tablet   Oral   Take 1 tablet (100 mg total) by mouth daily.   30 tablet   0   . traZODone (DESYREL) 150 MG tablet   Oral   Take 150 mg by mouth at bedtime as needed for sleep.          Triage Vitals: BP 119/95  Pulse 109  Temp(Src) 99.3 F (37.4 C) (Oral)  Resp 18  SpO2 97%  Physical Exam  Nursing note and vitals reviewed. Constitutional: He is oriented to person, place, and time. He appears well-developed and well-nourished.  HENT:  Head: Normocephalic and atraumatic.  Eyes: Conjunctivae and EOM are normal. Pupils are equal, round, and reactive to light.  Neck: Normal range of motion. Neck supple.  Cardiovascular: Normal rate, regular rhythm and normal heart sounds.   Pulmonary/Chest: Effort normal and breath sounds normal.  Abdominal: Soft. Bowel sounds are normal. There is tenderness.  Minimally tender in lower abdomen.   Musculoskeletal: Normal range of motion.  Neurological: He is alert and oriented to person, place, and time.  Skin: Skin is warm and dry.  Psychiatric: He has a normal mood and affect.    ED Course   DIAGNOSTIC STUDIES:  Oxygen Saturation is 97% on room air, normal by my interpretation.    COORDINATION OF CARE:  12:05 PM- Discussed treatment plan with patient which includes lab work, and the patient agreed to the plan.   Procedures (including critical care time)  Labs Reviewed  CBC WITH DIFFERENTIAL - Abnormal; Notable for the  following:    Platelets 510 (*)    All other components within normal limits  COMPREHENSIVE METABOLIC PANEL  LIPASE, BLOOD   No results found. No diagnosis found.  MDM  Patient has long-standing history of pancreatitis exacerbated by alcohol.  Today he presents with abdominal pain associated with pancreatitis.   IV fluids, IV pain meds. Admit to general medicine  I personally performed the services described in this documentation, which was scribed in my presence. The recorded information has been reviewed and is accurate.    Donnetta Hutching, MD 08/03/13 [phone]

## 2013-08-03 NOTE — Consult Note (Signed)
[name] [name] Medical Consultation  [name] [name] ZOX:[id number] DOB: 1961-09-10 DOA: 08/03/2013 PCP: Milinda Antis, MD   Requesting physician: Dr. Adriana Simas, ER. Date of consultation: 08/03/2013. Reason for consultation: Pancreatitis  Impression/Recommendations    1. Acute alcoholic pancreatitis, clinically resolving. The patient had one episode of vomiting 2 days ago and has had no further vomiting. He has been able to eat food in the last couple of days. His pain is not severe. He has no fever. His white blood cell count is not elevated. His liver enzymes are not elevated. His calcium is normal. He is not clinically or biochemically dehydrated. He is stable for discharge, I will give him oral analgesia and antiemetics as required. I've instructed him to drink plenty of water and eat a bland diet and advance diet as tolerated. If he should start to vomit, he needs to return back to the emergency room. 2. Alcoholism. I have counseled him once again about his alcoholism and the harm he is doing to his own body.   Chief Complaint: Abdominal pain.  HPI:  This 52 year old man, who is well-known to this hospital and who has a history of recurrent acute alcoholic pancreatitis, presents once again with 2 day history of abdominal pain and one episode of vomiting 2 days ago. He is been eating regular food since this time. He has not had a fever. The day prior to the episode of abdominal pain starting he drank liquor. He will not specify the quantity. When he presented to the emergency room, his lipase was elevated. Therefore we were asked to see this patient.  Review of Systems:  Apart from history of present illness, other systems negative.  Past Medical History  Diagnosis Date  . Bronchitis   . Acid reflux   . Pancreatitis     March 2013  . Gout   . Bipolar disorder   . Hyperlipidemia   . Alcohol abuse     6-8 cans of beer daily  . Pseudocyst of pancreas 02/28/2012  . Arthritis   .  Fracture of lower leg   . Hypertension   . Chronic pain   . Chronic neck pain   . Chronic back pain   . Left arm pain     chronic   Past Surgical History  Procedure Laterality Date  . Nose surgery      broken nose  . Circumcision  03/17/2012    Procedure: CIRCUMCISION ADULT;  Surgeon: Ky Barban, MD;  Location: [facility];  Service: Urology;  Laterality: N/A;  . Colonoscopy with propofol  12/24/2012    WJX:BJYN lesion as described above status post biopsy; otherwise normal rectum/Sigmoid diverticulosis/Sigmoid polyps -- resected as described above  . Esophagogastroduodenoscopy (egd) with propofol  12/24/2012    WGN:FAOZHYQ erythema and erosions of uncertain significance status post gastric biopsy   Social History:  reports that he has been smoking Cigarettes.  He has a 12.5 pack-year smoking history. He does not have any smokeless tobacco history on file. He reports that  drinks alcohol. He reports that he uses illicit drugs (Cocaine and Marijuana).  Allergies  Allergen Reactions  . Penicillins Itching   Family History  Problem Relation Age of Onset  . Diabetes Father   . Anesthesia problems Neg Hx   . Hypotension Neg Hx   . Malignant hyperthermia Neg Hx   . Pseudochol deficiency Neg Hx   . Colon cancer Neg Hx     Prior to Admission medications   Medication Sig Start  Date End Date Taking? Authorizing Provider  albuterol (PROVENTIL HFA;VENTOLIN HFA) 108 (90 BASE) MCG/ACT inhaler Inhale 2 puffs into the lungs every 4 (four) hours as needed for wheezing. For asthma/wheezing 03/19/13  Yes Sanjuana Kava, NP  amLODipine (NORVASC) 5 MG tablet Take 1 tablet (5 mg total) by mouth daily. For high blood pressure control 07/21/13  Yes Salley Scarlet, MD  citalopram (CELEXA) 10 MG tablet Take 1 tablet (10 mg total) by mouth daily as needed (Anxiety). 07/21/13  Yes Salley Scarlet, MD  fish oil-omega-3 fatty acids 1000 MG capsule Take 1 capsule (1 g total) by mouth daily. For cholesterol  control 03/19/13  Yes Sanjuana Kava, NP  gemfibrozil (LOPID) 600 MG tablet Take 1 tablet (600 mg total) by mouth 2 (two) times daily before a meal. Cholesterol control 07/21/13  Yes Salley Scarlet, MD  Multiple Vitamin (MULTIVITAMIN WITH MINERALS) TABS Take 1 tablet by mouth daily.   Yes Historical Provider, MD  omeprazole (PRILOSEC) 20 MG capsule Take 1 capsule (20 mg total) by mouth daily. For acid reflux 07/21/13  Yes Salley Scarlet, MD  polyethylene glycol [facility] / GLYCOLAX) packet Take 17 g by mouth daily. 06/29/13  Yes Pamella Pert, MD  tamsulosin (FLOMAX) 0.4 MG CAPS Take 1 capsule (0.4 mg total) by mouth daily. For enlarged prostate 07/21/13  Yes Salley Scarlet, MD  thiamine 100 MG tablet Take 1 tablet (100 mg total) by mouth daily. 05/01/13  Yes Donnamaria Shands Normajean Glasgow, MD  traZODone (DESYREL) 150 MG tablet Take 150 mg by mouth at bedtime as needed for sleep. 07/21/13  Yes Salley Scarlet, MD  oxyCODONE (ROXICODONE) 5 MG immediate release tablet Take 1 tablet (5 mg total) by mouth every 4 (four) hours as needed for pain. 08/03/13   Avri Paiva Normajean Glasgow, MD  prochlorperazine (COMPAZINE) 10 MG tablet Take 1 tablet (10 mg total) by mouth daily as needed (nausea). 08/03/13   Wilson Singer, MD   Physical Exam: Blood pressure 119/95, pulse 109, temperature 99.3 F (37.4 C), temperature source Oral, resp. rate 18, SpO2 97.00%. Filed Vitals:   08/03/13 1050  BP: 119/95  Pulse: 109  Temp: 99.3 F (37.4 C)  Resp: 18     General:  He looks systemically well. Is not toxic or septic.  Eyes: No pallor. No jaundice.  ENT: No abnormalities.  Neck: No lymphadenopathy.  Cardiovascular: Heart sounds are present without murmurs or added sounds.  Respiratory: Lung fields are clear.  Abdomen: Soft mildly tender in the generalized fashion. Bowel sounds are heard and are normal. There are no masses felt.  Skin: No rash.  Musculoskeletal: No acute joint abnormalities.  Psychiatric: Appropriate  affect.  Neurologic: Alert and orientated without any focal neurological signs.  Labs on Admission:  Basic Metabolic Panel:  Recent Labs Lab 08/03/13 1217  NA 137  K 3.3*  CL 100  CO2 27  GLUCOSE 123*  BUN 7  CREATININE 0.77  CALCIUM 9.5   Liver Function Tests:  Recent Labs Lab 08/03/13 1217  AST 12  ALT <5  ALKPHOS 65  BILITOT 0.2*  PROT 7.8  ALBUMIN 3.2*    Recent Labs Lab 08/03/13 1217  LIPASE 211*    CBC:  Recent Labs Lab 08/03/13 1217  WBC 6.9  NEUTROABS 5.0  HGB 13.4  HCT 39.5  MCV 91.6  PLT 510*   \   Radiological Exams on Admission:     Time spent: 45 minutes.  Nylan Nakatani C [name]  [name] Pager [phone]  If 7PM-7AM, please contact night-coverage [email or web address] Password [facility] 08/03/2013, 3:41 PM

## 2013-08-04 ENCOUNTER — Ambulatory Visit (INDEPENDENT_AMBULATORY_CARE_PROVIDER_SITE_OTHER): Payer: Medicare Other | Admitting: Gastroenterology

## 2013-08-04 ENCOUNTER — Encounter: Payer: Self-pay | Admitting: Gastroenterology

## 2013-08-04 VITALS — BP 113/72 | HR 90 | Temp 98.4°F | Ht 69.5 in | Wt 177.8 lb

## 2013-08-04 DIAGNOSIS — K8020 Calculus of gallbladder without cholecystitis without obstruction: Secondary | ICD-10-CM

## 2013-08-04 DIAGNOSIS — F1910 Other psychoactive substance abuse, uncomplicated: Secondary | ICD-10-CM

## 2013-08-04 DIAGNOSIS — K863 Pseudocyst of pancreas: Secondary | ICD-10-CM

## 2013-08-04 DIAGNOSIS — K852 Alcohol induced acute pancreatitis without necrosis or infection: Secondary | ICD-10-CM

## 2013-08-04 DIAGNOSIS — K859 Acute pancreatitis without necrosis or infection, unspecified: Secondary | ICD-10-CM

## 2013-08-04 DIAGNOSIS — E781 Pure hyperglyceridemia: Secondary | ICD-10-CM

## 2013-08-04 DIAGNOSIS — K862 Cyst of pancreas: Secondary | ICD-10-CM

## 2013-08-04 DIAGNOSIS — F1010 Alcohol abuse, uncomplicated: Secondary | ICD-10-CM

## 2013-08-04 MED ORDER — PANCRELIPASE (LIP-PROT-AMYL) 36000-114000 UNITS PO CPEP: 72000.0000 [IU] | ORAL_CAPSULE | Freq: Three times a day (TID) | ORAL | Status: DC

## 2013-08-04 NOTE — Patient Instructions (Signed)
1. Ct of your abdomen to follow up on your pancreas. 2. We will discuss referral to surgeon regarding gallstones after your CT. I do not feel strongly that your gallstones are causing a problem based on previous labs. You need to stop drinking alcohol as it is causing damage to your pancreas. 3. Start Creon, take two with meals and one with snacks. Samples and prescription sent to your pharmacy.

## 2013-08-04 NOTE — Progress Notes (Signed)
Primary Care Physician: Milinda Antis, MD  Primary Gastroenterologist:  Roetta Sessions, MD   Chief Complaint  Patient presents with  . Follow-up    HPI: [name] [name] is a 52 y.o. male here for followup of recent hospitalization for recurrent pancreatitis. He has been hospitalized about 4 times now. Last time was in July. He was seen by Dr. Karilyn Cota who is covering for our service at that time. CT of the abdomen pelvis with contrast on 06/25/2013 showed continued evolution of pancreatitis involving the pancreatic tail and distal body with interval development of loculated fluid collections within the pancreatic tail and left. Nephric space. No internal gas seen. Multifocal pancreatic pseudocyst felt to be likely. There is associated secondary inflammatory thickening of the posterior gastric body, splenic flexure the colon, anterior pararenal fascia left lateral coronal fascia. Small developing subscapular fluid collection along the lateral aspect of left kidney noted as well. These are new findings compared to May 2014. [facility] stated [facility] for about 2 weeks following this hospitalization to receive TPN. He was discharged on 07/17/2013. He states the doctor there recommended he have his gallbladder removed.   Patient went to the emergency department yesterday for recurrent epigastric/left upper quadrant pain. States he done well up until yesterday. Although he admits that he takes pain medication daily because if he doesn't he has abdominal pain. He was on morphine until he ran out last Friday. Sunday single episode of nonbloody emesis. No melena, brbpr. No diarrhea. Oxycodone given yesterday. He is taken 3 already today. Tolerated soup this morning. Last etoh use on Saturday. Drank six pack of beer. Drinking on weekends. Also had couple of shots. No illicit drug use.   States he did not get his medication filled that was supposed to help him stop drinking.  Current Outpatient Prescriptions   Medication Sig Dispense Refill  . albuterol (PROVENTIL HFA;VENTOLIN HFA) 108 (90 BASE) MCG/ACT inhaler Inhale 2 puffs into the lungs every 4 (four) hours as needed for wheezing. For asthma/wheezing  1 Inhaler  2  . amLODipine (NORVASC) 5 MG tablet Take 1 tablet (5 mg total) by mouth daily. For high blood pressure control  30 tablet  3  . citalopram (CELEXA) 10 MG tablet Take 1 tablet (10 mg total) by mouth daily as needed (Anxiety).  30 tablet  3  . fish oil-omega-3 fatty acids 1000 MG capsule Take 1 capsule (1 g total) by mouth daily. For cholesterol control      . gemfibrozil (LOPID) 600 MG tablet Take 1 tablet (600 mg total) by mouth 2 (two) times daily before a meal. Cholesterol control  60 tablet  3  . Multiple Vitamin (MULTIVITAMIN WITH MINERALS) TABS Take 1 tablet by mouth daily.      Marland Kitchen omeprazole (PRILOSEC) 20 MG capsule Take 1 capsule (20 mg total) by mouth daily. For acid reflux  30 capsule  3  . oxyCODONE (ROXICODONE) 5 MG immediate release tablet Take 1 tablet (5 mg total) by mouth every 4 (four) hours as needed for pain.  30 tablet  0  .       . prochlorperazine (COMPAZINE) 10 MG tablet Take 1 tablet (10 mg total) by mouth daily as needed (nausea).  30 tablet  0  . tamsulosin (FLOMAX) 0.4 MG CAPS Take 1 capsule (0.4 mg total) by mouth daily. For enlarged prostate  30 capsule  6  . thiamine 100 MG tablet Take 1 tablet (100 mg total) by mouth daily.  30 tablet  0  .  traZODone (DESYREL) 150 MG tablet Take 150 mg by mouth at bedtime as needed for sleep.       No current facility-administered medications for this visit.    Allergies as of 08/04/2013 - Review Complete 08/04/2013  Allergen Reaction Noted  . Penicillins Itching 08/05/2011   Past Medical History  Diagnosis Date  . Bronchitis   . Acid reflux   . Pancreatitis     March 2013  . Gout   . Bipolar disorder   . Hyperlipidemia   . Alcohol abuse     6-8 cans of beer daily  . Pseudocyst of pancreas 02/28/2012  . Arthritis    . Fracture of lower leg   . Hypertension   . Chronic pain   . Chronic neck pain   . Chronic back pain   . Left arm pain     chronic   Past Surgical History  Procedure Laterality Date  . Nose surgery      broken nose  . Circumcision  03/17/2012    Procedure: CIRCUMCISION ADULT;  Surgeon: Ky Barban, MD;  Location: [facility];  Service: Urology;  Laterality: N/A;  . Colonoscopy with propofol  12/24/2012    NWG:NFAO lesion as described above status post biopsy; otherwise normal rectum/Sigmoid diverticulosis/Sigmoid polyps -- resected as described above. anal lesion, benign. colon polyp, hyperplastic  . Esophagogastroduodenoscopy (egd) with propofol  12/24/2012    ZHY:QMVHQIO erythema and erosions of uncertain significance status post gastric biopsy (reactive gastropathy NO h.pylori)    ROS:  General: Negative for anorexia,fever, chills, fatigue, weakness. Weight is down about 10 pounds this year. ENT: Negative for hoarseness, difficulty swallowing , nasal congestion. CV: Negative for chest pain, angina, palpitations, dyspnea on exertion, peripheral edema.  Respiratory: Negative for dyspnea at rest, dyspnea on exertion, cough, sputum, wheezing.  GI: See history of present illness. GU:  Negative for dysuria, hematuria, urinary incontinence, urinary frequency, nocturnal urination.  Endo: Weight is down about 10 pounds over this year.    Physical Examination:   BP 113/72  Pulse 90  Temp(Src) 98.4 F (36.9 C) (Oral)  Ht 5' 9.5" (1.765 m)  Wt 177 lb 12.8 oz (80.65 kg)  BMI 25.89 kg/m2  General: Well-nourished, well-developed in no acute distress. Accompanied by significant other. Eyes: No icterus. Mouth: Oropharyngeal mucosa moist and pink , no lesions erythema or exudate. Lungs: Clear to auscultation bilaterally.  Heart: Regular rate and rhythm, no murmurs rubs or gallops.  Abdomen: Bowel sounds are normal, minimal left upper quadrant tenderness, nondistended, no  hepatosplenomegaly or masses, no abdominal bruits or hernia , no rebound or guarding.   Extremities: No lower extremity edema. No clubbing or deformities. Neuro: Alert and oriented x 4   Skin: Warm and dry, no jaundice.   Psych: Alert and cooperative, normal mood and affect.  Labs:  Lab Results  Component Value Date   CREATININE 0.77 08/03/2013   BUN 7 08/03/2013   NA 137 08/03/2013   K 3.3* 08/03/2013   CL 100 08/03/2013   CO2 27 08/03/2013   Lab Results  Component Value Date   ALT <5 08/03/2013   AST 12 08/03/2013   ALKPHOS 65 08/03/2013   BILITOT 0.2* 08/03/2013   Lab Results  Component Value Date   LIPASE 211* 08/03/2013    Imaging Studies: No results found.

## 2013-08-04 NOTE — Progress Notes (Signed)
CC'd to PCP 

## 2013-08-04 NOTE — Assessment & Plan Note (Signed)
52 year old gentleman with recurrent pancreatitis requiring hospitalization about 3 or 4 times since 2012. Last hospitalization was in July, extensive requiring TPN and long-term stay. Patient reports doing reasonably well since discharge on July 26 although he reports daily morphine use until he ran out last Friday. Last alcohol consumption over the weekend. Developed recurrent vomiting and abdominal pain and presented to the emergency room yesterday. Lipase over 200. LFTs were normal. History of polysubstance abuse including cocaine and marijuana but he denies at this time. Patient given fluids and oxycodone, advised clear liquid diet at home.  Patient had new pseudocyst formations (2) and extensive inflammation around the left kidney on his last CT in the beginning of July. It was recommended that he have a followup CT and therefore we will schedule this today. Working diagnosis for etiology of pancreatitis has been primarily alcohol consumption. It is noted that patient has had hypertriglyceridemia back in 2012 with a value greater than 1500 although more recent checks have been 600 or less. He also has a history of cholelithiasis but felt to be fairly asymptomatic. There has never been any suggestion of biliary pancreatitis based on LFT abnormality. Patient is interested in seeking surgical opinion. Long discussion with him and his fiance today, cannot rule out some symptoms related to cholelithiasis that he needs to be aware that cholecystectomy will not solve all of his pancreatic problems.  Briefly discussed narcotic use today. Patient will not be provided with chronic narcotic use from our office. If the need arises for chronic pain management, he should seek care from his PCP or we can make referral to pain management clinic.  We will start Creon 72,000 units with meals and 36,000 units with snacks. Samples and prescription provided.  1. Recommend complete alcohol cessation. Recommend no illicit  drug use. 2. CT abdomen and pelvis with contrast to follow up on pancreatic pseudocyst. 3. After CT, consider surgical opinion regarding cholelithiasis. Patient request Dr. Lovell Sheehan or Dr. Leticia Penna. 4. Continue treatment for hypertriglyceridemia. 5. Given recent flare, clear liquid diet for the next one to 2 days and advance as tolerated.

## 2013-08-06 ENCOUNTER — Ambulatory Visit (HOSPITAL_COMMUNITY)
Admission: RE | Admit: 2013-08-06 | Discharge: 2013-08-06 | Disposition: A | Discharge status: Home / Self Care | Payer: Medicare Other | Source: Ambulatory Visit | Attending: Gastroenterology | Admitting: Gastroenterology

## 2013-08-06 DIAGNOSIS — F1010 Alcohol abuse, uncomplicated: Secondary | ICD-10-CM

## 2013-08-06 DIAGNOSIS — F1910 Other psychoactive substance abuse, uncomplicated: Secondary | ICD-10-CM

## 2013-08-06 DIAGNOSIS — K863 Pseudocyst of pancreas: Secondary | ICD-10-CM

## 2013-08-06 DIAGNOSIS — R109 Unspecified abdominal pain: Secondary | ICD-10-CM | POA: Insufficient documentation

## 2013-08-06 DIAGNOSIS — K859 Acute pancreatitis without necrosis or infection, unspecified: Secondary | ICD-10-CM | POA: Insufficient documentation

## 2013-08-06 DIAGNOSIS — E781 Pure hyperglyceridemia: Secondary | ICD-10-CM

## 2013-08-06 DIAGNOSIS — K852 Alcohol induced acute pancreatitis without necrosis or infection: Secondary | ICD-10-CM

## 2013-08-06 DIAGNOSIS — K862 Cyst of pancreas: Secondary | ICD-10-CM | POA: Insufficient documentation

## 2013-08-06 DIAGNOSIS — K8020 Calculus of gallbladder without cholecystitis without obstruction: Secondary | ICD-10-CM

## 2013-08-06 MED ORDER — IOHEXOL 300 MG/ML  SOLN
100.0000 mL | Freq: Once | INTRAMUSCULAR | Status: AC | PRN
  Administered 2013-08-06: 100 mL via INTRAVENOUS

## 2013-08-07 ENCOUNTER — Encounter (HOSPITAL_COMMUNITY): Payer: Self-pay | Admitting: *Deleted

## 2013-08-07 ENCOUNTER — Emergency Department (HOSPITAL_COMMUNITY)
Admission: EM | Admit: 2013-08-07 | Discharge: 2013-08-08 | Disposition: A | Discharge status: Home / Self Care | Payer: Medicare Other | Attending: Emergency Medicine | Admitting: Emergency Medicine

## 2013-08-07 DIAGNOSIS — Z8719 Personal history of other diseases of the digestive system: Secondary | ICD-10-CM | POA: Insufficient documentation

## 2013-08-07 DIAGNOSIS — Z8639 Personal history of other endocrine, nutritional and metabolic disease: Secondary | ICD-10-CM | POA: Insufficient documentation

## 2013-08-07 DIAGNOSIS — Z862 Personal history of diseases of the blood and blood-forming organs and certain disorders involving the immune mechanism: Secondary | ICD-10-CM | POA: Insufficient documentation

## 2013-08-07 DIAGNOSIS — Z791 Long term (current) use of non-steroidal anti-inflammatories (NSAID): Secondary | ICD-10-CM | POA: Insufficient documentation

## 2013-08-07 DIAGNOSIS — F319 Bipolar disorder, unspecified: Secondary | ICD-10-CM | POA: Insufficient documentation

## 2013-08-07 DIAGNOSIS — Z880 Allergy status to penicillin: Secondary | ICD-10-CM | POA: Insufficient documentation

## 2013-08-07 DIAGNOSIS — G8929 Other chronic pain: Secondary | ICD-10-CM

## 2013-08-07 DIAGNOSIS — I10 Essential (primary) hypertension: Secondary | ICD-10-CM | POA: Insufficient documentation

## 2013-08-07 DIAGNOSIS — K219 Gastro-esophageal reflux disease without esophagitis: Secondary | ICD-10-CM | POA: Insufficient documentation

## 2013-08-07 DIAGNOSIS — M549 Dorsalgia, unspecified: Secondary | ICD-10-CM | POA: Insufficient documentation

## 2013-08-07 DIAGNOSIS — M129 Arthropathy, unspecified: Secondary | ICD-10-CM | POA: Insufficient documentation

## 2013-08-07 DIAGNOSIS — F17200 Nicotine dependence, unspecified, uncomplicated: Secondary | ICD-10-CM | POA: Insufficient documentation

## 2013-08-07 DIAGNOSIS — Z8781 Personal history of (healed) traumatic fracture: Secondary | ICD-10-CM | POA: Insufficient documentation

## 2013-08-07 DIAGNOSIS — Z79899 Other long term (current) drug therapy: Secondary | ICD-10-CM | POA: Insufficient documentation

## 2013-08-07 DIAGNOSIS — E785 Hyperlipidemia, unspecified: Secondary | ICD-10-CM | POA: Insufficient documentation

## 2013-08-07 DIAGNOSIS — Z8709 Personal history of other diseases of the respiratory system: Secondary | ICD-10-CM | POA: Insufficient documentation

## 2013-08-07 MED ORDER — MELOXICAM 7.5 MG PO TABS: 7.5000 mg | ORAL_TABLET | Freq: Every day | ORAL | Status: DC

## 2013-08-07 MED ORDER — KETOROLAC TROMETHAMINE 60 MG/2ML IM SOLN
60.0000 mg | Freq: Once | INTRAMUSCULAR | Status: AC
  Administered 2013-08-07: 60 mg via INTRAMUSCULAR
  Filled 2013-08-07: qty 2

## 2013-08-07 MED ORDER — CYCLOBENZAPRINE HCL 5 MG PO TABS: 5.0000 mg | ORAL_TABLET | Freq: Three times a day (TID) | ORAL | Status: DC | PRN

## 2013-08-07 MED ORDER — CYCLOBENZAPRINE HCL 10 MG PO TABS
10.0000 mg | ORAL_TABLET | Freq: Once | ORAL | Status: AC
  Administered 2013-08-07: 10 mg via ORAL
  Filled 2013-08-07: qty 1

## 2013-08-07 NOTE — ED Notes (Signed)
Per pt, has had "lower back pain that goes up to left side of rib cage" x2 days. Pt states that he also had one episode of sharp, shooting pain down left leg this morning. Pt denies event related to pain, but "happened all the sudden." Pt denies numbness, tingling, or urinary/bowel symptoms.

## 2013-08-07 NOTE — ED Notes (Signed)
Pt c/o lower back pain, no known injuries and no urinary symptoms.

## 2013-08-07 NOTE — ED Provider Notes (Signed)
CSN: [id number]     Arrival date & time 08/07/13  2204 History     First MD Initiated Contact with Patient 08/07/13 2307     Chief Complaint  Patient presents with  . Back Pain   (Consider location/radiation/quality/duration/timing/severity/associated sxs/prior Treatment) HPI Comments: [name] [name] is a 52 y.o. Male with a history significant for chronic pancreatitis, polysubstance abuse including etoh abuse presenting  with acute on chronic middle left back pain which has which has been present for the past 2 days.  His pain is constant and he has muscle spasm which has been intermittent. He borrowed a friends "muscle relaxer" pill (does not know the name) which gave no improvement.   Patient denies any new injury specifically.  There is no radiation of pain.  There has been no weakness or numbness in the lower extremities and no urinary or bowel retention or incontinence.  Additionally he denies abdominal pain, nausea, vomiting and abdominal distention.  He was seen here 3 days ago for his chronic abdominal pain associated with pancreatitis which he states is better now.    Patient does not have a history of cancer or IVDU.      The history is provided by the patient.    Past Medical History  Diagnosis Date  . Bronchitis   . Acid reflux   . Pancreatitis     March 2013  . Gout   . Bipolar disorder   . Hyperlipidemia   . Alcohol abuse     6-8 cans of beer daily  . Pseudocyst of pancreas 02/28/2012  . Arthritis   . Fracture of lower leg   . Hypertension   . Chronic pain   . Chronic neck pain   . Chronic back pain   . Left arm pain     chronic   Past Surgical History  Procedure Laterality Date  . Nose surgery      broken nose  . Circumcision  03/17/2012    Procedure: CIRCUMCISION ADULT;  Surgeon: Ky Barban, MD;  Location: [facility];  Service: Urology;  Laterality: N/A;  . Colonoscopy with propofol  12/24/2012    WUJ:WJXB lesion as described above status post biopsy;  otherwise normal rectum/Sigmoid diverticulosis/Sigmoid polyps -- resected as described above. anal lesion, benign. colon polyp, hyperplastic  . Esophagogastroduodenoscopy (egd) with propofol  12/24/2012    JYN:WGNFAOZ erythema and erosions of uncertain significance status post gastric biopsy (reactive gastropathy NO h.pylori)   Family History  Problem Relation Age of Onset  . Diabetes Father   . Anesthesia problems Neg Hx   . Hypotension Neg Hx   . Malignant hyperthermia Neg Hx   . Pseudochol deficiency Neg Hx   . Colon cancer Neg Hx    History  Substance Use Topics  . Smoking status: Current Every Day Smoker -- 0.50 packs/day for 25 years    Types: Cigarettes  . Smokeless tobacco: Not on file  . Alcohol Use: 0.0 oz/week     Comment: 6-8 beers a day    Review of Systems  Constitutional: Negative for fever.  Respiratory: Negative for shortness of breath.   Cardiovascular: Negative for chest pain and leg swelling.  Gastrointestinal: Negative for abdominal pain, constipation and abdominal distention.  Genitourinary: Negative for dysuria, urgency, frequency, flank pain and difficulty urinating.  Musculoskeletal: Positive for back pain. Negative for joint swelling and gait problem.  Skin: Negative for rash.  Neurological: Negative for weakness and numbness.    Allergies  Penicillins  Home  Medications   Current Outpatient Rx  Name  Route  Sig  Dispense  Refill  . amLODipine (NORVASC) 5 MG tablet   Oral   Take 1 tablet (5 mg total) by mouth daily. For high blood pressure control   30 tablet   3   . citalopram (CELEXA) 10 MG tablet   Oral   Take 1 tablet (10 mg total) by mouth daily as needed (Anxiety).   30 tablet   3   . fish oil-omega-3 fatty acids 1000 MG capsule   Oral   Take 1 capsule (1 g total) by mouth daily. For cholesterol control         . gemfibrozil (LOPID) 600 MG tablet   Oral   Take 1 tablet (600 mg total) by mouth 2 (two) times daily before a meal.  Cholesterol control   60 tablet   3   . Multiple Vitamin (MULTIVITAMIN WITH MINERALS) TABS   Oral   Take 1 tablet by mouth daily.         Marland Kitchen omeprazole (PRILOSEC) 20 MG capsule   Oral   Take 1 capsule (20 mg total) by mouth daily. For acid reflux   30 capsule   3   . oxyCODONE (ROXICODONE) 5 MG immediate release tablet   Oral   Take 1 tablet (5 mg total) by mouth every 4 (four) hours as needed for pain.   30 tablet   0   . Pancrelipase, Lip-Prot-Amyl, (CREON) 36000 UNITS CPEP   Oral   Take 72,000 Units by mouth 3 (three) times daily with meals. Take 2 capsules with meals and one with snacks.   240 capsule   3   . prochlorperazine (COMPAZINE) 10 MG tablet   Oral   Take 1 tablet (10 mg total) by mouth daily as needed (nausea).   30 tablet   0   . tamsulosin (FLOMAX) 0.4 MG CAPS   Oral   Take 1 capsule (0.4 mg total) by mouth daily. For enlarged prostate   30 capsule   6   . thiamine 100 MG tablet   Oral   Take 1 tablet (100 mg total) by mouth daily.   30 tablet   0   . traZODone (DESYREL) 150 MG tablet   Oral   Take 150 mg by mouth at bedtime as needed for sleep.         Marland Kitchen albuterol (PROVENTIL HFA;VENTOLIN HFA) 108 (90 BASE) MCG/ACT inhaler   Inhalation   Inhale 2 puffs into the lungs every 4 (four) hours as needed for wheezing. For asthma/wheezing   1 Inhaler   2   . cyclobenzaprine (FLEXERIL) 5 MG tablet   Oral   Take 1 tablet (5 mg total) by mouth 3 (three) times daily as needed for muscle spasms.   15 tablet   0   . meloxicam (MOBIC) 7.5 MG tablet   Oral   Take 1 tablet (7.5 mg total) by mouth daily.   15 tablet   0    BP 126/84  Pulse 87  Temp(Src) 97.8 F (36.6 C) (Oral)  Resp 18  Ht 5\' 9"  (1.753 m)  Wt 178 lb (80.74 kg)  BMI 26.27 kg/m2  SpO2 98% Physical Exam  Nursing note and vitals reviewed. Constitutional: He appears well-developed and well-nourished.  HENT:  Head: Normocephalic.  Eyes: Conjunctivae are normal.  Neck:  Normal range of motion. Neck supple.  Cardiovascular: Normal rate and intact distal pulses.   Pedal pulses normal.  Pulmonary/Chest: Effort normal.  Abdominal: Soft. Bowel sounds are normal. He exhibits no distension and no mass.  Musculoskeletal: Normal range of motion. He exhibits no edema.       Lumbar back: He exhibits tenderness. He exhibits no swelling, no edema and no spasm.  TTP along left lateral mid back musculature,  Palpable muscle spasm appreciated.  Neurological: He is alert. He has normal strength. He displays no atrophy and no tremor. No sensory deficit. Gait normal.  Reflex Scores:      Patellar reflexes are 2+ on the right side and 2+ on the left side.      Achilles reflexes are 2+ on the right side and 2+ on the left side. No strength deficit noted in hip and knee flexor and extensor muscle groups.  Ankle flexion and extension intact.  Skin: Skin is warm and dry.  Psychiatric: He has a normal mood and affect.    ED Course   Procedures (including critical care time)  Labs Reviewed - No data to display  1. Chronic mid back pain     MDM  Acute on chronic back pain - he was prescribed flexeril, mobic.  He has oxycodone from his visit here 3 days ago.  Advised heat tx,  F/u with pcp if not improving this week.  Burgess Amor, PA-C 08/08/13 0000

## 2013-08-08 MED ORDER — OXYCODONE-ACETAMINOPHEN 5-325 MG PO TABS
1.0000 | ORAL_TABLET | Freq: Once | ORAL | Status: AC
  Administered 2013-08-08: 1 via ORAL
  Filled 2013-08-08: qty 1

## 2013-08-08 NOTE — ED Provider Notes (Signed)
Medical screening examination/treatment/procedure(s) were performed by non-physician practitioner and as supervising physician I was immediately available for consultation/collaboration.    Vida Roller, MD 08/08/13 [phone]

## 2013-08-09 ENCOUNTER — Emergency Department (HOSPITAL_COMMUNITY): Payer: Medicare Other

## 2013-08-09 ENCOUNTER — Encounter (HOSPITAL_COMMUNITY): Payer: Self-pay | Admitting: Emergency Medicine

## 2013-08-09 ENCOUNTER — Emergency Department (HOSPITAL_COMMUNITY)
Admission: EM | Admit: 2013-08-09 | Discharge: 2013-08-09 | Disposition: A | Discharge status: Home / Self Care | Payer: Medicare Other | Attending: Emergency Medicine | Admitting: Emergency Medicine

## 2013-08-09 DIAGNOSIS — Z880 Allergy status to penicillin: Secondary | ICD-10-CM | POA: Insufficient documentation

## 2013-08-09 DIAGNOSIS — Z8659 Personal history of other mental and behavioral disorders: Secondary | ICD-10-CM | POA: Insufficient documentation

## 2013-08-09 DIAGNOSIS — F17200 Nicotine dependence, unspecified, uncomplicated: Secondary | ICD-10-CM | POA: Insufficient documentation

## 2013-08-09 DIAGNOSIS — G8929 Other chronic pain: Secondary | ICD-10-CM | POA: Insufficient documentation

## 2013-08-09 DIAGNOSIS — Z8781 Personal history of (healed) traumatic fracture: Secondary | ICD-10-CM | POA: Insufficient documentation

## 2013-08-09 DIAGNOSIS — I10 Essential (primary) hypertension: Secondary | ICD-10-CM | POA: Insufficient documentation

## 2013-08-09 DIAGNOSIS — Z79899 Other long term (current) drug therapy: Secondary | ICD-10-CM | POA: Insufficient documentation

## 2013-08-09 DIAGNOSIS — Z8709 Personal history of other diseases of the respiratory system: Secondary | ICD-10-CM | POA: Insufficient documentation

## 2013-08-09 DIAGNOSIS — Z8739 Personal history of other diseases of the musculoskeletal system and connective tissue: Secondary | ICD-10-CM | POA: Insufficient documentation

## 2013-08-09 DIAGNOSIS — Z8719 Personal history of other diseases of the digestive system: Secondary | ICD-10-CM | POA: Insufficient documentation

## 2013-08-09 DIAGNOSIS — Z8639 Personal history of other endocrine, nutritional and metabolic disease: Secondary | ICD-10-CM | POA: Insufficient documentation

## 2013-08-09 DIAGNOSIS — R109 Unspecified abdominal pain: Secondary | ICD-10-CM | POA: Insufficient documentation

## 2013-08-09 DIAGNOSIS — Z791 Long term (current) use of non-steroidal anti-inflammatories (NSAID): Secondary | ICD-10-CM | POA: Insufficient documentation

## 2013-08-09 DIAGNOSIS — Z862 Personal history of diseases of the blood and blood-forming organs and certain disorders involving the immune mechanism: Secondary | ICD-10-CM | POA: Insufficient documentation

## 2013-08-09 DIAGNOSIS — K219 Gastro-esophageal reflux disease without esophagitis: Secondary | ICD-10-CM | POA: Insufficient documentation

## 2013-08-09 LAB — COMPREHENSIVE METABOLIC PANEL
ALT: 14 U/L (ref 0–53)
AST: 30 U/L (ref 0–37)
Albumin: 3.1 g/dL — ABNORMAL LOW (ref 3.5–5.2)
Alkaline Phosphatase: 62 U/L (ref 39–117)
BUN: 12 mg/dL (ref 6–23)
CO2: 24 mEq/L (ref 19–32)
Calcium: 8.8 mg/dL (ref 8.4–10.5)
Chloride: 97 mEq/L (ref 96–112)
Creatinine, Ser: 0.75 mg/dL (ref 0.50–1.35)
GFR calc Af Amer: >90 mL/min (ref >90)
GFR calc non Af Amer: >90 mL/min (ref >90)
Glucose, Bld: 108 mg/dL — ABNORMAL HIGH (ref 70–99)
Potassium: 3.1 mEq/L — ABNORMAL LOW (ref 3.5–5.1)
Sodium: 132 mEq/L — ABNORMAL LOW (ref 135–145)
Total Bilirubin: 0.1 mg/dL — ABNORMAL LOW (ref 0.3–1.2)
Total Protein: 6.7 g/dL (ref 6.0–8.3)

## 2013-08-09 LAB — CBC WITH DIFFERENTIAL/PLATELET
Basophils Absolute: 0.1 10*3/uL (ref 0.0–0.1)
Basophils Relative: 1 % (ref 0–1)
Eosinophils Absolute: 0.2 10*3/uL (ref 0.0–0.7)
Eosinophils Relative: 2 % (ref 0–5)
HCT: 36.5 % — ABNORMAL LOW (ref 39.0–52.0)
Hemoglobin: 12 g/dL — ABNORMAL LOW (ref 13.0–17.0)
Lymphocytes Relative: 15 % (ref 12–46)
Lymphs Abs: 1.3 10*3/uL (ref 0.7–4.0)
MCH: 30.2 pg (ref 26.0–34.0)
MCHC: 32.9 g/dL (ref 30.0–36.0)
MCV: 91.9 fL (ref 78.0–100.0)
Monocytes Absolute: 0.5 10*3/uL (ref 0.1–1.0)
Monocytes Relative: 6 % (ref 3–12)
Neutro Abs: 6.2 10*3/uL (ref 1.7–7.7)
Neutrophils Relative %: 76 % (ref 43–77)
Platelets: 527 10*3/uL — ABNORMAL HIGH (ref 150–400)
RBC: 3.97 MIL/uL — ABNORMAL LOW (ref 4.22–5.81)
RDW: 14.2 % (ref 11.5–15.5)
WBC: 8.2 10*3/uL (ref 4.0–10.5)

## 2013-08-09 LAB — LIPASE, BLOOD: Lipase: 47 U/L (ref 11–59)

## 2013-08-09 MED ORDER — OXYCODONE-ACETAMINOPHEN 5-325 MG PO TABS: 2.0000 | ORAL_TABLET | ORAL | Status: DC | PRN

## 2013-08-09 MED ORDER — MORPHINE SULFATE 4 MG/ML IJ SOLN
4.0000 mg | Freq: Once | INTRAMUSCULAR | Status: AC
  Administered 2013-08-09: 4 mg via INTRAVENOUS
  Filled 2013-08-09: qty 1

## 2013-08-09 MED ORDER — OXYCODONE-ACETAMINOPHEN 5-325 MG PO TABS
2.0000 | ORAL_TABLET | Freq: Once | ORAL | Status: AC
  Administered 2013-08-09: 2 via ORAL
  Filled 2013-08-09: qty 2

## 2013-08-09 MED ORDER — SODIUM CHLORIDE 0.9 % IV BOLUS (SEPSIS)
1000.0000 mL | Freq: Once | INTRAVENOUS | Status: AC
  Administered 2013-08-09: 1000 mL via INTRAVENOUS

## 2013-08-09 NOTE — ED Notes (Signed)
Patient lying quietly in bed until asked how his pain is and then he begins to moan and writhe c/o continued pain.

## 2013-08-09 NOTE — ED Notes (Signed)
Patient c/o continued abdominal pain.  Dr. Hyacinth Meeker aware and will re-evaluate patient.

## 2013-08-09 NOTE — ED Provider Notes (Signed)
CSN: [id number]     Arrival date & time 08/09/13  0119 History     First MD Initiated Contact with Patient 08/09/13 0127     Chief Complaint  Patient presents with  . Abdominal Pain   (Consider location/radiation/quality/duration/timing/severity/associated sxs/prior Treatment) HPI Comments: The pt is a 52 y/o male with hx of ETOH abuse and pancreatitis who presents with c/o abd pain which came on several hours ago - is severe, peristent and coming in waves.  He has no n/v with this and when i ask history - he answers with one word answer "pancreas".  According to the fiance who accompanies him, he does not drink around her but admits to it.  According to the MR, the pt has had recent w/u in ED and inpatient with following tests:  08/04/13 - had CT abd showing increased size of pseudocysts and inflammatory changes of the stomach and colon.    He had been seen by GI this week and evaluation prompted possible surgical consultation though it seems fairly clear that his pancreatitis is likely from his ETOH consumption +/- his hypertriglyceridemia.  He has had ongoing alochol problems and refuses to stop drinking.  He did have a prolonged stay in the hospital in last 2 months requiring TPN at one point.  Patient is a 52 y.o. male presenting with abdominal pain. The history is provided by the patient, a relative and medical records.  Abdominal Pain   Past Medical History  Diagnosis Date  . Bronchitis   . Acid reflux   . Pancreatitis     March 2013  . Gout   . Bipolar disorder   . Hyperlipidemia   . Alcohol abuse     6-8 cans of beer daily  . Pseudocyst of pancreas 02/28/2012  . Arthritis   . Fracture of lower leg   . Hypertension   . Chronic pain   . Chronic neck pain   . Chronic back pain   . Left arm pain     chronic   Past Surgical History  Procedure Laterality Date  . Nose surgery      broken nose  . Circumcision  03/17/2012    Procedure: CIRCUMCISION ADULT;  Surgeon: Ky Barban, MD;  Location: [facility];  Service: Urology;  Laterality: N/A;  . Colonoscopy with propofol  12/24/2012    JJO:ACZY lesion as described above status post biopsy; otherwise normal rectum/Sigmoid diverticulosis/Sigmoid polyps -- resected as described above. anal lesion, benign. colon polyp, hyperplastic  . Esophagogastroduodenoscopy (egd) with propofol  12/24/2012    SAY:TKZSWFU erythema and erosions of uncertain significance status post gastric biopsy (reactive gastropathy NO h.pylori)   Family History  Problem Relation Age of Onset  . Diabetes Father   . Anesthesia problems Neg Hx   . Hypotension Neg Hx   . Malignant hyperthermia Neg Hx   . Pseudochol deficiency Neg Hx   . Colon cancer Neg Hx    History  Substance Use Topics  . Smoking status: Current Every Day Smoker -- 0.50 packs/day for 25 years    Types: Cigarettes  . Smokeless tobacco: Not on file  . Alcohol Use: 0.0 oz/week     Comment: 6-8 beers a day    Review of Systems  Gastrointestinal: Positive for abdominal pain.  All other systems reviewed and are negative.    Allergies  Penicillins  Home Medications   Current Outpatient Rx  Name  Route  Sig  Dispense  Refill  . albuterol (  PROVENTIL HFA;VENTOLIN HFA) 108 (90 BASE) MCG/ACT inhaler   Inhalation   Inhale 2 puffs into the lungs every 4 (four) hours as needed for wheezing. For asthma/wheezing   1 Inhaler   2   . amLODipine (NORVASC) 5 MG tablet   Oral   Take 1 tablet (5 mg total) by mouth daily. For high blood pressure control   30 tablet   3   . citalopram (CELEXA) 10 MG tablet   Oral   Take 1 tablet (10 mg total) by mouth daily as needed (Anxiety).   30 tablet   3   . cyclobenzaprine (FLEXERIL) 5 MG tablet   Oral   Take 1 tablet (5 mg total) by mouth 3 (three) times daily as needed for muscle spasms.   15 tablet   0   . fish oil-omega-3 fatty acids 1000 MG capsule   Oral   Take 1 capsule (1 g total) by mouth daily. For cholesterol control          . gemfibrozil (LOPID) 600 MG tablet   Oral   Take 1 tablet (600 mg total) by mouth 2 (two) times daily before a meal. Cholesterol control   60 tablet   3   . meloxicam (MOBIC) 7.5 MG tablet   Oral   Take 1 tablet (7.5 mg total) by mouth daily.   15 tablet   0   . Multiple Vitamin (MULTIVITAMIN WITH MINERALS) TABS   Oral   Take 1 tablet by mouth daily.         Marland Kitchen omeprazole (PRILOSEC) 20 MG capsule   Oral   Take 1 capsule (20 mg total) by mouth daily. For acid reflux   30 capsule   3   . oxyCODONE (ROXICODONE) 5 MG immediate release tablet   Oral   Take 1 tablet (5 mg total) by mouth every 4 (four) hours as needed for pain.   30 tablet   0   . Pancrelipase, Lip-Prot-Amyl, (CREON) 36000 UNITS CPEP   Oral   Take 72,000 Units by mouth 3 (three) times daily with meals. Take 2 capsules with meals and one with snacks.   240 capsule   3   . prochlorperazine (COMPAZINE) 10 MG tablet   Oral   Take 1 tablet (10 mg total) by mouth daily as needed (nausea).   30 tablet   0   . tamsulosin (FLOMAX) 0.4 MG CAPS   Oral   Take 1 capsule (0.4 mg total) by mouth daily. For enlarged prostate   30 capsule   6   . thiamine 100 MG tablet   Oral   Take 1 tablet (100 mg total) by mouth daily.   30 tablet   0   . traZODone (DESYREL) 150 MG tablet   Oral   Take 150 mg by mouth at bedtime as needed for sleep.         Marland Kitchen oxyCODONE-acetaminophen (PERCOCET/ROXICET) 5-325 MG per tablet   Oral   Take 2 tablets by mouth every 4 (four) hours as needed for pain.   10 tablet   0    BP 130/81  Pulse 80  Temp(Src) 98.2 F (36.8 C) (Oral)  Resp 20  Wt 178 lb (80.74 kg)  BMI 26.27 kg/m2  SpO2 95% Physical Exam  Nursing note and vitals reviewed. Constitutional: He appears well-developed and well-nourished.  Uncomfortable appearing  HENT:  Head: Normocephalic and atraumatic.  Mouth/Throat: Oropharynx is clear and moist. No oropharyngeal exudate.  Eyes: Conjunctivae  and  EOM are normal. Pupils are equal, round, and reactive to light. Right eye exhibits no discharge. Left eye exhibits no discharge. No scleral icterus.  Neck: Normal range of motion. Neck supple. No JVD present. No thyromegaly present.  Cardiovascular: Normal rate, regular rhythm, normal heart sounds and intact distal pulses.  Exam reveals no gallop and no friction rub.   No murmur heard. Pulmonary/Chest: Effort normal and breath sounds normal. No respiratory distress. He has no wheezes. He has no rales.  Abdominal: Soft. Bowel sounds are normal. He exhibits no distension and no mass. There is tenderness ( diffuse abd ttp, diffuse guarding and tense abdomen.).  Musculoskeletal: Normal range of motion. He exhibits no edema and no tenderness.  Lymphadenopathy:    He has no cervical adenopathy.  Neurological: He is alert. Coordination normal.  Skin: Skin is warm and dry. No rash noted. No erythema.  Psychiatric: He has a normal mood and affect. His behavior is normal.    ED Course   Procedures (including critical care time)  Labs Reviewed  CBC WITH DIFFERENTIAL - Abnormal; Notable for the following:    RBC 3.97 (*)    Hemoglobin 12.0 (*)    HCT 36.5 (*)    Platelets 527 (*)    All other components within normal limits  COMPREHENSIVE METABOLIC PANEL - Abnormal; Notable for the following:    Sodium 132 (*)    Potassium 3.1 (*)    Glucose, Bld 108 (*)    Albumin 3.1 (*)    Total Bilirubin 0.1 (*)    All other components within normal limits  LIPASE, BLOOD   Dg Abd Acute W/chest  08/09/2013   *RADIOLOGY REPORT*  Clinical Data: Abdominal pain.  ACUTE ABDOMEN SERIES (ABDOMEN 2 VIEW & CHEST 1 VIEW)  Comparison: CT abdomen and pelvis 08/06/2013  Findings: Shallow inspiration.  Infiltration or atelectasis in the right lung base.  Normal heart size and pulmonary vascularity.  No blunting of costophrenic angles.  No pneumothorax.  Mediastinal contours appear intact.  Scattered gas and stool  throughout the colon.  Mild prominence of mid abdominal gas filled loops, likely reactive given the presence of pancreatitis on the CT scan.  No abnormal air fluid levels.  No free intra-abdominal air.  Degenerative changes in the lumbar spine and hips.  Calcifications in the pelvis consistent with phleboliths.  No radiopaque stones demonstrated.  IMPRESSION: Shallow inspiration with infiltration or atelectasis in the left lung base.  Mild prominence of mid abdominal small bowel likely reactive.  No free air.   Original Report Authenticated By: Burman Nieves, M.D.   1. Abdominal pain     MDM  The pt has been evaluated by GI this week, he has findings concerning for possible perforation though it is more likely to be pancreatitis flare.  He does occasionally have severe flares and needs pain medications at this time.  IVF and NPO status until he improves.  AAS to eval for free air.  The acute abdominal series shows no signs of free air. I have personally seen and interpreted these x-rays. There is no pneumonias, no significant stool burden and no free air.  Laboratory workup is also unremarkable showing that the patient has a normal white blood cell count, normal hemoglobin, normal lipase and essentially normal electrolytes renal function and liver function. The patient was given an initial dose of morphine at 4 mg and had improvement. He stopped moaning and crying out in pain. After one hour he was evaluated  by the nurse and upon opening his door he started moaning requesting more pain medication. He did not make any noise at all when the door was closed however when I open the door again a brief time later, he would roll around on the bed as if he was in severe pain. He would open his eyes to see if you are watching him. When I walked by the room and he is not paying attention he is laying perfectly still or sleeping without any grimacing. He was given one dose of oral medications prior to discharge. I  have no doubt that the patient has chronic pancreatitis which is exacerbated by alcohol use however I feel that the patient has an element of malingering today as well. His vital signs are stable, he is not tachycardic nor is he hypotensive.  He was not nauseated or vomiting. I reviewed with him the dietary instructions for pancreatitis prior to discharge.   Filed Vitals:   08/09/13 0132  BP: 130/81  Pulse: 80  Temp: 98.2 F (36.8 C)  TempSrc: Oral  Resp: 20  Weight: 178 lb (80.74 kg)  SpO2: 95%    Meds given in ED:  Medications  morphine 4 MG/ML injection 4 mg (4 mg Intravenous Given 08/09/13 0151)  sodium chloride 0.9 % bolus 1,000 mL (0 mL Intravenous Stopped 08/09/13 0232)  morphine 4 MG/ML injection 4 mg (4 mg Intravenous Given 08/09/13 0232)  oxyCODONE-acetaminophen (PERCOCET/ROXICET) 5-325 MG per tablet 2 tablet (2 tablets Oral Given 08/09/13 0557)    Discharge Medication List as of 08/09/2013  5:52 AM    START taking these medications   Details  oxyCODONE-acetaminophen (PERCOCET/ROXICET) 5-325 MG per tablet Take 2 tablets by mouth every 4 (four) hours as needed for pain., Starting 08/09/2013, Until Discontinued, Print          Vida Roller, MD 08/09/13 [phone]

## 2013-08-09 NOTE — ED Notes (Signed)
Pt requesting more pain medication. Per pt last pain injection did not help. Dr. Hyacinth Meeker aware

## 2013-08-09 NOTE — ED Notes (Signed)
Patient continues to c/o abdominal pain; rates as 6-7/10.

## 2013-08-09 NOTE — ED Notes (Signed)
Patient c/o abdominal pain that started earlier tonight.  Patient denies nausea, vomiting or diarrhea.

## 2013-08-11 ENCOUNTER — Encounter: Payer: Self-pay | Admitting: Family Medicine

## 2013-08-11 ENCOUNTER — Ambulatory Visit (INDEPENDENT_AMBULATORY_CARE_PROVIDER_SITE_OTHER): Payer: Medicare Other | Admitting: Family Medicine

## 2013-08-11 VITALS — BP 100/70 | HR 92 | Temp 97.7°F | Ht 68.5 in | Wt 177.0 lb

## 2013-08-11 DIAGNOSIS — G8929 Other chronic pain: Secondary | ICD-10-CM

## 2013-08-11 DIAGNOSIS — K859 Acute pancreatitis without necrosis or infection, unspecified: Secondary | ICD-10-CM

## 2013-08-11 DIAGNOSIS — K852 Alcohol induced acute pancreatitis without necrosis or infection: Secondary | ICD-10-CM

## 2013-08-11 DIAGNOSIS — F1010 Alcohol abuse, uncomplicated: Secondary | ICD-10-CM

## 2013-08-11 DIAGNOSIS — R21 Rash and other nonspecific skin eruption: Secondary | ICD-10-CM

## 2013-08-11 DIAGNOSIS — K863 Pseudocyst of pancreas: Secondary | ICD-10-CM

## 2013-08-11 DIAGNOSIS — K862 Cyst of pancreas: Secondary | ICD-10-CM

## 2013-08-11 DIAGNOSIS — M549 Dorsalgia, unspecified: Secondary | ICD-10-CM

## 2013-08-11 MED ORDER — OXYCODONE-ACETAMINOPHEN 5-325 MG PO TABS: 1.0000 | ORAL_TABLET | ORAL | Status: DC | PRN

## 2013-08-11 MED ORDER — CETIRIZINE HCL 10 MG PO TABS: 10.0000 mg | ORAL_TABLET | Freq: Every day | ORAL | Status: DC

## 2013-08-11 MED ORDER — CEPHALEXIN 500 MG PO CAPS: 500.0000 mg | ORAL_CAPSULE | Freq: Two times a day (BID) | ORAL | Status: DC

## 2013-08-11 MED ORDER — HYDROCORTISONE 1 % EX OINT: TOPICAL_OINTMENT | Freq: Two times a day (BID) | CUTANEOUS | Status: DC

## 2013-08-11 MED ORDER — POTASSIUM CHLORIDE CRYS ER 20 MEQ PO TBCR: 20.0000 meq | EXTENDED_RELEASE_TABLET | Freq: Every day | ORAL | Status: DC

## 2013-08-11 NOTE — Patient Instructions (Addendum)
Take the potassium daily for 3 days Clear liquid diet until pain goes away Pain meds given no more refills Take antibiotics Use cream on legs F/U with stomach doctor

## 2013-08-12 DIAGNOSIS — R21 Rash and other nonspecific skin eruption: Secondary | ICD-10-CM | POA: Insufficient documentation

## 2013-08-12 NOTE — Assessment & Plan Note (Signed)
Unchanged, continue to counsel

## 2013-08-12 NOTE — Progress Notes (Signed)
  Subjective:    Patient ID: [name] [name], male    DOB: Apr 29, 1961, 52 y.o.   MRN: [id number]  HPI  Pt here to f/u ER visit, has been seen twice seconary to alcoholic pancreatitis flare. He was not admitted, hydrated and given IV meds. Has some abd pain, no N/V, still on clear liquids. Continues to drink.Also having chronic back pain, no chagne in bowel or bladder, no weakness, no radiating symptoms Rash on bilateral legs for past 3 days, bumps itchy, came to white heads. THought he had poison IVY.GF does not have rash   Review of Systems - per above  GEN- denies fatigue, fever, weight loss,weakness, recent illness HEENT- denies eye drainage, change in vision, nasal discharge, CVS- denies chest pain, palpitations RESP- denies SOB, cough, wheeze ABD- denies N/V, change in stools, +abd pain GU- denies dysuria, hematuria, dribbling, incontinence MSK- + joint pain, muscle aches, injury Neuro- denies headache, dizziness, syncope, seizure activity       Objective:   Physical Exam GEN- NAD, alert and oriented x3 HEENT- PERRL, EOMI, non injected sclera, pink conjunctiva, MMM, oropharynx clear CVS- RRR, no murmur RESP-CTAB ABD-NABS,soft,Mild TTP epigastric and RUQ,ND, no rebound EXT- No edema Pulses- Radial 2+ Skin- scattered erythematous maculopapular rash on bilateral legs and feet few pustules seen, +excoriations. Soles spared , 2 excoriated lesions on left shoulder      Assessment & Plan:

## 2013-08-12 NOTE — Assessment & Plan Note (Signed)
Unchanged due to his ETOH and substance abuse I will not keep him on longterm pain meds ,and he understands this

## 2013-08-12 NOTE — Assessment & Plan Note (Signed)
Reviewed CT scan and labs, labs okay, CT showed enlargement of psuedocyst Currently his pain is resolving, keep him on clear liquids until pain completely gone Given 30 tabs of pain medication,  Will contact GI

## 2013-08-12 NOTE — Assessment & Plan Note (Signed)
Appears to be enlarging ? Drainage needed Refer to gi

## 2013-08-12 NOTE — Assessment & Plan Note (Signed)
Unclear cause, will cover with antibiotics with some pustular lesions Given topical steroid

## 2013-08-16 NOTE — Progress Notes (Signed)
Quick Note:  Discussed CT findings with Dr. Jena Gauss. Progression of pseudocysts and inflammation. Indication of acute on chronic pancreatitis. Patient must abstain from all etoh use to get better!!! Dr. Jena Gauss does not recommend getting gallbladder out. Pancreatitis likely secondary to etoh use and as long as drinking the need for surgery cannot be determined and not recommended. Continue pancreatitic enzymes as before. If having abdominal pain, he should back down to full liquid diet or clear liquids. OV with RMR in 2-3 weeks or sooner if available.  ______

## 2013-08-17 ENCOUNTER — Emergency Department (HOSPITAL_COMMUNITY): Payer: Medicare Other

## 2013-08-17 ENCOUNTER — Emergency Department (HOSPITAL_COMMUNITY)
Admission: EM | Admit: 2013-08-17 | Discharge: 2013-08-17 | Disposition: A | Discharge status: Home / Self Care | Payer: Medicare Other | Attending: Emergency Medicine | Admitting: Emergency Medicine

## 2013-08-17 ENCOUNTER — Encounter (HOSPITAL_COMMUNITY): Payer: Self-pay | Admitting: *Deleted

## 2013-08-17 DIAGNOSIS — R110 Nausea: Secondary | ICD-10-CM | POA: Insufficient documentation

## 2013-08-17 DIAGNOSIS — Z8709 Personal history of other diseases of the respiratory system: Secondary | ICD-10-CM | POA: Insufficient documentation

## 2013-08-17 DIAGNOSIS — Z8639 Personal history of other endocrine, nutritional and metabolic disease: Secondary | ICD-10-CM | POA: Insufficient documentation

## 2013-08-17 DIAGNOSIS — Z791 Long term (current) use of non-steroidal anti-inflammatories (NSAID): Secondary | ICD-10-CM | POA: Insufficient documentation

## 2013-08-17 DIAGNOSIS — M549 Dorsalgia, unspecified: Secondary | ICD-10-CM | POA: Insufficient documentation

## 2013-08-17 DIAGNOSIS — Z862 Personal history of diseases of the blood and blood-forming organs and certain disorders involving the immune mechanism: Secondary | ICD-10-CM | POA: Insufficient documentation

## 2013-08-17 DIAGNOSIS — K219 Gastro-esophageal reflux disease without esophagitis: Secondary | ICD-10-CM | POA: Insufficient documentation

## 2013-08-17 DIAGNOSIS — M129 Arthropathy, unspecified: Secondary | ICD-10-CM | POA: Insufficient documentation

## 2013-08-17 DIAGNOSIS — Z8781 Personal history of (healed) traumatic fracture: Secondary | ICD-10-CM | POA: Insufficient documentation

## 2013-08-17 DIAGNOSIS — E785 Hyperlipidemia, unspecified: Secondary | ICD-10-CM | POA: Insufficient documentation

## 2013-08-17 DIAGNOSIS — Z880 Allergy status to penicillin: Secondary | ICD-10-CM | POA: Insufficient documentation

## 2013-08-17 DIAGNOSIS — Z79899 Other long term (current) drug therapy: Secondary | ICD-10-CM | POA: Insufficient documentation

## 2013-08-17 DIAGNOSIS — IMO0002 Reserved for concepts with insufficient information to code with codable children: Secondary | ICD-10-CM | POA: Insufficient documentation

## 2013-08-17 DIAGNOSIS — F1021 Alcohol dependence, in remission: Secondary | ICD-10-CM | POA: Insufficient documentation

## 2013-08-17 DIAGNOSIS — F17200 Nicotine dependence, unspecified, uncomplicated: Secondary | ICD-10-CM | POA: Insufficient documentation

## 2013-08-17 DIAGNOSIS — G8929 Other chronic pain: Secondary | ICD-10-CM | POA: Insufficient documentation

## 2013-08-17 DIAGNOSIS — K859 Acute pancreatitis without necrosis or infection, unspecified: Secondary | ICD-10-CM | POA: Insufficient documentation

## 2013-08-17 LAB — CBC WITH DIFFERENTIAL/PLATELET
Basophils Absolute: 0 10*3/uL (ref 0.0–0.1)
Basophils Relative: 0 % (ref 0–1)
Eosinophils Absolute: 0.2 10*3/uL (ref 0.0–0.7)
Eosinophils Relative: 2 % (ref 0–5)
HCT: 35.1 % — ABNORMAL LOW (ref 39.0–52.0)
Hemoglobin: 11.6 g/dL — ABNORMAL LOW (ref 13.0–17.0)
Lymphocytes Relative: 17 % (ref 12–46)
Lymphs Abs: 1.7 10*3/uL (ref 0.7–4.0)
MCH: 30.2 pg (ref 26.0–34.0)
MCHC: 33 g/dL (ref 30.0–36.0)
MCV: 91.4 fL (ref 78.0–100.0)
Monocytes Absolute: 0.6 10*3/uL (ref 0.1–1.0)
Monocytes Relative: 6 % (ref 3–12)
Neutro Abs: 7.4 10*3/uL (ref 1.7–7.7)
Neutrophils Relative %: 75 % (ref 43–77)
Platelets: 698 10*3/uL — ABNORMAL HIGH (ref 150–400)
RBC: 3.84 MIL/uL — ABNORMAL LOW (ref 4.22–5.81)
RDW: 14.1 % (ref 11.5–15.5)
WBC: 9.9 10*3/uL (ref 4.0–10.5)

## 2013-08-17 LAB — COMPREHENSIVE METABOLIC PANEL
ALT: 5 U/L (ref 0–53)
AST: 10 U/L (ref 0–37)
Albumin: 2.9 g/dL — ABNORMAL LOW (ref 3.5–5.2)
Alkaline Phosphatase: 80 U/L (ref 39–117)
BUN: 7 mg/dL (ref 6–23)
CO2: 31 mEq/L (ref 19–32)
Calcium: 9.3 mg/dL (ref 8.4–10.5)
Chloride: 96 mEq/L (ref 96–112)
Creatinine, Ser: 0.91 mg/dL (ref 0.50–1.35)
GFR calc Af Amer: >90 mL/min (ref >90)
GFR calc non Af Amer: >90 mL/min (ref >90)
Glucose, Bld: 104 mg/dL — ABNORMAL HIGH (ref 70–99)
Potassium: 3.1 mEq/L — ABNORMAL LOW (ref 3.5–5.1)
Sodium: 136 mEq/L (ref 135–145)
Total Bilirubin: <0.1 mg/dL — ABNORMAL LOW (ref 0.3–1.2)
Total Protein: 7.5 g/dL (ref 6.0–8.3)

## 2013-08-17 LAB — ETHANOL: Alcohol, Ethyl (B): <11 mg/dL (ref 0–11)

## 2013-08-17 LAB — LIPASE, BLOOD: Lipase: 67 U/L — ABNORMAL HIGH (ref 11–59)

## 2013-08-17 MED ORDER — SODIUM CHLORIDE 0.9 % IV SOLN
Freq: Once | INTRAVENOUS | Status: AC
  Administered 2013-08-17: 18:00:00 via INTRAVENOUS

## 2013-08-17 MED ORDER — OXYCODONE-ACETAMINOPHEN 5-325 MG PO TABS: 1.0000 | ORAL_TABLET | Freq: Four times a day (QID) | ORAL | Status: DC | PRN

## 2013-08-17 MED ORDER — ONDANSETRON HCL 4 MG/2ML IJ SOLN
4.0000 mg | Freq: Once | INTRAMUSCULAR | Status: AC
  Administered 2013-08-17: 4 mg via INTRAVENOUS
  Filled 2013-08-17: qty 2

## 2013-08-17 MED ORDER — HYDROMORPHONE HCL PF 1 MG/ML IJ SOLN
1.0000 mg | Freq: Once | INTRAMUSCULAR | Status: AC
  Administered 2013-08-17: 1 mg via INTRAVENOUS
  Filled 2013-08-17: qty 1

## 2013-08-17 MED ORDER — PANTOPRAZOLE SODIUM 40 MG IV SOLR
40.0000 mg | Freq: Once | INTRAVENOUS | Status: AC
  Administered 2013-08-17: 40 mg via INTRAVENOUS
  Filled 2013-08-17: qty 40

## 2013-08-17 MED ORDER — POTASSIUM CHLORIDE CRYS ER 20 MEQ PO TBCR
40.0000 meq | EXTENDED_RELEASE_TABLET | Freq: Once | ORAL | Status: AC
  Administered 2013-08-17: 40 meq via ORAL
  Filled 2013-08-17: qty 2

## 2013-08-17 MED ORDER — PROMETHAZINE HCL 25 MG PO TABS: 25.0000 mg | ORAL_TABLET | Freq: Four times a day (QID) | ORAL | Status: DC | PRN

## 2013-08-17 NOTE — ED Notes (Signed)
Pt alert & oriented x4, stable gait. Patient given discharge instructions, paperwork & prescription(s). Patient  instructed to stop at the registration desk to finish any additional paperwork. Patient verbalized understanding. Pt left department w/ no further questions. 

## 2013-08-17 NOTE — ED Notes (Signed)
Pt also requesting help with detox from etoh.  EDP aware and told pt he had to be medically cleared first.  Pt denies any HI or SI.

## 2013-08-17 NOTE — ED Notes (Signed)
Patient w/pancreatitis had escalation of RUQ pain since Sunday.  Seen last weekend for same.  Last had ETOH on Sunday.  6/10 pain.  Nausea, no vomiting.  Interested in talking to someone about ETOH rehab

## 2013-08-17 NOTE — Progress Notes (Signed)
Quick Note:  He was given 30 oxycodone from PCP on 07/2013. He should request pain medication from one provider for continuity of care. He should ask his PCP. ______

## 2013-08-17 NOTE — ED Provider Notes (Signed)
CSN: [id number]     Arrival date & time 08/17/13  1645 History  This chart was scribed for [name] Lennert, MD by Ronal Fear, ED Scribe. This patient was seen in room APA10/APA10 and the patient's care was started at 6:11 PM.    Chief Complaint  Patient presents with  . Abdominal Pain  . Back Pain    Patient is a 52 y.o. male presenting with abdominal pain and back pain. The history is provided by the patient. No language interpreter was used.  Abdominal Pain Pain location:  RUQ Pain radiates to:  Does not radiate Pain severity:  Severe Onset quality:  Sudden Duration:  2 days Timing:  Constant Progression:  Worsening Chronicity:  Chronic Context: alcohol use   Relieved by:  Nothing Worsened by:  Nothing tried Ineffective treatments:  None tried Associated symptoms: nausea   Associated symptoms: no chest pain, no cough, no diarrhea, no fatigue, no hematuria and no vomiting   Back Pain Location:  Generalized Quality:  Aching Radiates to:  Does not radiate Pain severity:  Moderate Onset quality:  Gradual Duration:  2 days Timing:  Constant Progression:  Worsening Associated symptoms: abdominal pain   Associated symptoms: no chest pain and no headaches    HPI Comments: [name] [name] is a 52 y.o. male who presents to the Emergency Department complaining of abdominal pain due to pancreatitis for the past 2 days and back pain. He was seen 1 week ago for the same issue. Prior to onset of his abdominal pain, he reports drinking "non alcoholic" beers, 8 in quantity, which contain 0.5% alcohol, the equivalent to one whole beer. The patient is very sensitive to alchol which affects his pancreatitis. He has been nauseated but no vomiting. Pt is a current every day smoker of 0.5 packs/day of 25 years.    Past Medical History  Diagnosis Date  . Bronchitis   . Acid reflux   . Pancreatitis     March 2013  . Gout   . Bipolar disorder   . Hyperlipidemia   . Alcohol abuse     6-8 cans  of beer daily  . Pseudocyst of pancreas 02/28/2012  . Arthritis   . Fracture of lower leg   . Hypertension   . Chronic pain   . Chronic neck pain   . Chronic back pain   . Left arm pain     chronic   Past Surgical History  Procedure Laterality Date  . Nose surgery      broken nose  . Circumcision  03/17/2012    Procedure: CIRCUMCISION ADULT;  Surgeon: Ky Barban, MD;  Location: [facility];  Service: Urology;  Laterality: N/A;  . Colonoscopy with propofol  12/24/2012    XBM:[id number] lesion as described above status post biopsy; otherwise normal rectum/Sigmoid diverticulosis/Sigmoid polyps -- resected as described above. anal lesion, benign. colon polyp, hyperplastic  . Esophagogastroduodenoscopy (egd) with propofol  12/24/2012    [id number]:[id number] erythema and erosions of uncertain significance status post gastric biopsy (reactive gastropathy NO h.pylori)   Family History  Problem Relation Age of Onset  . Diabetes Father   . Anesthesia problems Neg Hx   . Hypotension Neg Hx   . Malignant hyperthermia Neg Hx   . Pseudochol deficiency Neg Hx   . Colon cancer Neg Hx    History  Substance Use Topics  . Smoking status: Current Every Day Smoker -- 0.50 packs/day for 25 years    Types: Cigarettes  . Smokeless  tobacco: Not on file  . Alcohol Use: 0.0 oz/week     Comment: 6-8 beers a day    Review of Systems  Constitutional: Negative for appetite change and fatigue.  HENT: Negative for congestion, sinus pressure and ear discharge.   Eyes: Negative for discharge.  Respiratory: Negative for cough.   Cardiovascular: Negative for chest pain.  Gastrointestinal: Positive for nausea and abdominal pain. Negative for vomiting and diarrhea.  Genitourinary: Negative for frequency and hematuria.  Musculoskeletal: Negative for back pain.  Skin: Negative for rash.  Neurological: Negative for seizures and headaches.  Psychiatric/Behavioral: Negative for hallucinations.    Allergies   Penicillins  Home Medications   Current Outpatient Rx  Name  Route  Sig  Dispense  Refill  . amLODipine (NORVASC) 5 MG tablet   Oral   Take 1 tablet (5 mg total) by mouth daily. For high blood pressure control   30 tablet   3   . cetirizine (ZYRTEC) 10 MG tablet   Oral   Take 1 tablet (10 mg total) by mouth daily. For allergies/sinus   30 tablet   6   . cyclobenzaprine (FLEXERIL) 5 MG tablet   Oral   Take 1 tablet (5 mg total) by mouth 3 (three) times daily as needed for muscle spasms.   15 tablet   0   . fish oil-omega-3 fatty acids 1000 MG capsule   Oral   Take 1 capsule (1 g total) by mouth daily. For cholesterol control         . gemfibrozil (LOPID) 600 MG tablet   Oral   Take 1 tablet (600 mg total) by mouth 2 (two) times daily before a meal. Cholesterol control   60 tablet   3   . hydrocortisone 1 % ointment   Topical   Apply topically 2 (two) times daily.   30 g   0   . meloxicam (MOBIC) 7.5 MG tablet   Oral   Take 1 tablet (7.5 mg total) by mouth daily.   15 tablet   0   . Multiple Vitamin (MULTIVITAMIN WITH MINERALS) TABS   Oral   Take 1 tablet by mouth daily.         Marland Kitchen omeprazole (PRILOSEC) 20 MG capsule   Oral   Take 1 capsule (20 mg total) by mouth daily. For acid reflux   30 capsule   3   . oxyCODONE-acetaminophen (PERCOCET/ROXICET) 5-325 MG per tablet   Oral   Take 1 tablet by mouth every 4 (four) hours as needed for pain.   30 tablet   0   . Pancrelipase, Lip-Prot-Amyl, (CREON) 36000 UNITS CPEP   Oral   Take 72,000 Units by mouth 3 (three) times daily with meals. Take 2 capsules with meals and one with snacks.   240 capsule   3   . potassium chloride SA (K-DUR,KLOR-CON) 20 MEQ tablet   Oral   Take 1 tablet (20 mEq total) by mouth daily.   3 tablet   0   . prochlorperazine (COMPAZINE) 10 MG tablet   Oral   Take 1 tablet (10 mg total) by mouth daily as needed (nausea).   30 tablet   0   . tamsulosin (FLOMAX) 0.4 MG  CAPS   Oral   Take 1 capsule (0.4 mg total) by mouth daily. For enlarged prostate   30 capsule   6   . thiamine 100 MG tablet   Oral   Take 1 tablet (100 mg  total) by mouth daily.   30 tablet   0   . traZODone (DESYREL) 150 MG tablet   Oral   Take 150 mg by mouth at bedtime as needed for sleep.         Marland Kitchen albuterol (PROVENTIL HFA;VENTOLIN HFA) 108 (90 BASE) MCG/ACT inhaler   Inhalation   Inhale 2 puffs into the lungs every 4 (four) hours as needed for wheezing. For asthma/wheezing   1 Inhaler   2   . cephALEXin (KEFLEX) 500 MG capsule   Oral   Take 1 capsule (500 mg total) by mouth 2 (two) times daily.   14 capsule   0   . citalopram (CELEXA) 10 MG tablet   Oral   Take 1 tablet (10 mg total) by mouth daily as needed (Anxiety).   30 tablet   3    Triage Vitals: BP 124/83  Pulse 103  Temp(Src) 99.3 F (37.4 C) (Oral)  Resp 18  Ht 5' 9.5" (1.765 m)  Wt 177 lb (80.287 kg)  BMI 25.77 kg/m2  SpO2 98%  Physical Exam  Nursing note and vitals reviewed. Constitutional: He is oriented to person, place, and time. He appears well-developed and well-nourished. No distress.  HENT:  Head: Normocephalic and atraumatic.  Eyes: EOM are normal.  Neck: Neck supple. No tracheal deviation present.  Cardiovascular: Normal rate.   Pulmonary/Chest: Effort normal. No respiratory distress.  Musculoskeletal: Normal range of motion.  Neurological: He is alert and oriented to person, place, and time.  Skin: Skin is warm and dry.  Psychiatric: He has a normal mood and affect. His behavior is normal.    ED Course  Procedures (including critical care time)  DIAGNOSTIC STUDIES: Oxygen Saturation is 98% on RA, normal by my interpretation.    COORDINATION OF CARE: 6:17 PM- Pt advised of plan for treatment including medication for back pain and DG abd acute with chest and pt agrees.  7:41 PM- Upon recheck pt is slightly more comfortable, but pancreas is still causing him pain.  He was  admitted in July for pancreatitis. Discussed with Pt to be admitted, pt agrees.  9:18 PM Pt will not be admitted for detox because his labs were normal and his alcohol level was 0 but he will be treated through outpatient therapy  Medications  HYDROmorphone (DILAUDID) injection 1 mg (1 mg Intravenous Given 08/17/13 1826)  ondansetron (ZOFRAN) injection 4 mg (4 mg Intravenous Given 08/17/13 1825)  pantoprazole (PROTONIX) injection 40 mg (40 mg Intravenous Given 08/17/13 1825)  0.9 %  sodium chloride infusion ( Intravenous New Bag/Given 08/17/13 1825)  potassium chloride SA (K-DUR,KLOR-CON) CR tablet 40 mEq (40 mEq Oral Given 08/17/13 2012)  HYDROmorphone (DILAUDID) injection 1 mg (1 mg Intravenous Given 08/17/13 2016)  ondansetron (ZOFRAN) injection 4 mg (4 mg Intravenous Given 08/17/13 2014)    Labs Review Labs Reviewed  CBC WITH DIFFERENTIAL - Abnormal; Notable for the following:    RBC 3.84 (*)    Hemoglobin 11.6 (*)    HCT 35.1 (*)    Platelets 698 (*)    All other components within normal limits  COMPREHENSIVE METABOLIC PANEL - Abnormal; Notable for the following:    Potassium 3.1 (*)    Glucose, Bld 104 (*)    Albumin 2.9 (*)    Total Bilirubin <0.1 (*)    All other components within normal limits  LIPASE, BLOOD - Abnormal; Notable for the following:    Lipase 67 (*)    All other components  within normal limits  ETHANOL   Imaging Review Dg Abd Acute W/chest  08/17/2013   *RADIOLOGY REPORT*  Clinical Data: Abdominal pain.  ACUTE ABDOMEN SERIES (ABDOMEN 2 VIEW & CHEST 1 VIEW)  Comparison: 08/09/2013  Findings: Patchy subsegmental atelectasis  or early interstitial infiltrates in both lung bases, slightly increased on the left since previous exam.  Heart size normal.  No definite effusion.  No free air.  Normal bowel gas pattern.  Bilateral pelvic phleboliths. Regional bones unremarkable.  IMPRESSION:  1.  Normal bowel gas pattern. 2.  No free air. 3.  Bibasilar atelectasis  or  infiltrates, slightly increased on the left since prior study.   Original Report Authenticated By: D. Andria Rhein, MD    MDM  No diagnosis found. Pt improved with tx.  Spoke with hospitalist and will try out pt tx  The chart was scribed for me under my direct supervision.  I personally performed the history, physical, and medical decision making and all procedures in the evaluation of this patient.[name] Lennert, MD 08/17/13 2126

## 2013-08-17 NOTE — ED Notes (Signed)
Pt eating a piece of fried chicken & asking for pain medication.

## 2013-08-17 NOTE — ED Notes (Signed)
Pt reports drank approx 8 nonalcoholic beers Saturday and has had increasingly worse upper abd and back pain with nausea.  Denies vomiting or diarrhea.  LBM was today and was normal per pt.

## 2013-08-17 NOTE — ED Notes (Signed)
Pt requesting something to eat. Pt given a pack of crackers & some water to drink. NAD noted.

## 2013-08-27 ENCOUNTER — Encounter: Payer: Self-pay | Admitting: Internal Medicine

## 2013-08-27 ENCOUNTER — Telehealth: Payer: Self-pay | Admitting: Family Medicine

## 2013-08-27 ENCOUNTER — Ambulatory Visit (INDEPENDENT_AMBULATORY_CARE_PROVIDER_SITE_OTHER): Payer: Medicare Other | Admitting: Internal Medicine

## 2013-08-27 VITALS — BP 140/85 | HR 77 | Temp 98.4°F | Ht 69.0 in | Wt 174.0 lb

## 2013-08-27 DIAGNOSIS — K859 Acute pancreatitis without necrosis or infection, unspecified: Secondary | ICD-10-CM

## 2013-08-27 DIAGNOSIS — K8020 Calculus of gallbladder without cholecystitis without obstruction: Secondary | ICD-10-CM

## 2013-08-27 DIAGNOSIS — F1910 Other psychoactive substance abuse, uncomplicated: Secondary | ICD-10-CM

## 2013-08-27 MED ORDER — OXYCODONE-ACETAMINOPHEN 5-325 MG PO TABS: 1.0000 | ORAL_TABLET | Freq: Four times a day (QID) | ORAL | Status: DC | PRN

## 2013-08-27 NOTE — Patient Instructions (Signed)
Must stop drinking alcoholol and using illicit - your health depends on it  Continue protonix and pancreatic enzymes  Get with Dr. Jeanice Lim regarding pain medications  Office visit with [place] in 8 weeks

## 2013-08-27 NOTE — Telephone Encounter (Signed)
Pt called and said that he just left Dr. Luvenia Starch office. Pt said that he told him to call you to get a refill on his Percocet. Can you refill this for him?

## 2013-08-27 NOTE — Progress Notes (Signed)
Primary Care Physician:  Milinda Antis, MD Primary Gastroenterologist:  Dr. Jena Gauss  Pre-Procedure History & Physical: HPI:  Nosson Wender is a 52 y.o. male here for followup of a acute on chronic pancreatitis. Seen in ER couple weeks ago with abdominal pain. Lipase mildly elevated. Recent CT scan demonstrates persistence of pseudocysts with some inflammatory changes consistent with acute pancreatitis. He has been drinking both "alcohol free" beer and regular beer-as recently as yesterday. Cocaine as recently as one month ago. He has cholelithiasis which is felt to be asymptomatic. Triglycerides have been not been markedly elevated. Takes Creon only sporadically. He gets his oxygen go to inform Dr. Jeanice Lim. Intermittent nonbloody diarrhea.  He's lost 3 pounds since his last office visit.  Past Medical History  Diagnosis Date  . Bronchitis   . Acid reflux   . Pancreatitis     March 2013  . Gout   . Bipolar disorder   . Hyperlipidemia   . Alcohol abuse     6-8 cans of beer daily  . Pseudocyst of pancreas 02/28/2012  . Arthritis   . Fracture of lower leg   . Hypertension   . Chronic pain   . Chronic neck pain   . Chronic back pain   . Left arm pain     chronic    Past Surgical History  Procedure Laterality Date  . Nose surgery      broken nose  . Circumcision  03/17/2012    Procedure: CIRCUMCISION ADULT;  Surgeon: Ky Barban, MD;  Location: [facility];  Service: Urology;  Laterality: N/A;  . Colonoscopy with propofol  12/24/2012    ZOX:WRUE lesion as described above status post biopsy; otherwise normal rectum/Sigmoid diverticulosis/Sigmoid polyps -- resected as described above. anal lesion, benign. colon polyp, hyperplastic  . Esophagogastroduodenoscopy (egd) with propofol  12/24/2012    [id number] erythema and erosions of uncertain significance status post gastric biopsy (reactive gastropathy NO h.pylori)    Prior to Admission medications   Medication Sig Start Date End Date Taking?  Authorizing Provider  albuterol (PROVENTIL HFA;VENTOLIN HFA) 108 (90 BASE) MCG/ACT inhaler Inhale 2 puffs into the lungs every 4 (four) hours as needed for wheezing. For asthma/wheezing 03/19/13  Yes Sanjuana Kava, NP  amLODipine (NORVASC) 5 MG tablet Take 1 tablet (5 mg total) by mouth daily. For high blood pressure control 07/21/13  Yes Salley Scarlet, MD  cetirizine (ZYRTEC) 10 MG tablet Take 1 tablet (10 mg total) by mouth daily. For allergies/sinus 08/11/13  Yes Salley Scarlet, MD  citalopram (CELEXA) 10 MG tablet Take 1 tablet (10 mg total) by mouth daily as needed (Anxiety). 07/21/13  Yes Salley Scarlet, MD  cyclobenzaprine (FLEXERIL) 5 MG tablet Take 1 tablet (5 mg total) by mouth 3 (three) times daily as needed for muscle spasms. 08/07/13  Yes Burgess Amor, PA-C  fish oil-omega-3 fatty acids 1000 MG capsule Take 1 capsule (1 g total) by mouth daily. For cholesterol control 03/19/13  Yes Sanjuana Kava, NP  gemfibrozil (LOPID) 600 MG tablet Take 1 tablet (600 mg total) by mouth 2 (two) times daily before a meal. Cholesterol control 07/21/13  Yes Salley Scarlet, MD  Multiple Vitamin (MULTIVITAMIN WITH MINERALS) TABS Take 1 tablet by mouth daily.   Yes Historical Provider, MD  omeprazole (PRILOSEC) 20 MG capsule Take 1 capsule (20 mg total) by mouth daily. For acid reflux 07/21/13  Yes Salley Scarlet, MD  Pancrelipase, Lip-Prot-Amyl, (CREON) 36000 UNITS CPEP Take 72,000 Units  by mouth 3 (three) times daily with meals. Take 2 capsules with meals and one with snacks. 08/04/13  Yes Tiffany Kocher, PA-C  tamsulosin (FLOMAX) 0.4 MG CAPS Take 1 capsule (0.4 mg total) by mouth daily. For enlarged prostate 07/21/13  Yes Salley Scarlet, MD  traZODone (DESYREL) 150 MG tablet Take 150 mg by mouth at bedtime as needed for sleep. 07/21/13  Yes Salley Scarlet, MD  cephALEXin (KEFLEX) 500 MG capsule Take 1 capsule (500 mg total) by mouth 2 (two) times daily. 08/11/13   Salley Scarlet, MD  hydrocortisone 1 %  ointment Apply topically 2 (two) times daily. 08/11/13   Salley Scarlet, MD  meloxicam (MOBIC) 7.5 MG tablet Take 1 tablet (7.5 mg total) by mouth daily. 08/07/13   Burgess Amor, PA-C  oxyCODONE-acetaminophen (PERCOCET/ROXICET) 5-325 MG per tablet Take 1 tablet by mouth every 4 (four) hours as needed for pain. 08/11/13   Salley Scarlet, MD  oxyCODONE-acetaminophen (PERCOCET/ROXICET) 5-325 MG per tablet Take 1 tablet by mouth every 6 (six) hours as needed for pain. 08/17/13   Benny Lennert, MD  potassium chloride SA (K-DUR,KLOR-CON) 20 MEQ tablet Take 1 tablet (20 mEq total) by mouth daily. 08/11/13   Salley Scarlet, MD  prochlorperazine (COMPAZINE) 10 MG tablet Take 1 tablet (10 mg total) by mouth daily as needed (nausea). 08/03/13   Nimish Normajean Glasgow, MD  promethazine (PHENERGAN) 25 MG tablet Take 1 tablet (25 mg total) by mouth every 6 (six) hours as needed for nausea. 08/17/13   Benny Lennert, MD  thiamine 100 MG tablet Take 1 tablet (100 mg total) by mouth daily. 05/01/13   Wilson Singer, MD    Allergies as of 08/27/2013 - Review Complete 08/27/2013  Allergen Reaction Noted  . Penicillins Itching 08/05/2011    Family History  Problem Relation Age of Onset  . Diabetes Father   . Anesthesia problems Neg Hx   . Hypotension Neg Hx   . Malignant hyperthermia Neg Hx   . Pseudochol deficiency Neg Hx   . Colon cancer Neg Hx     History   Social History  . Marital Status: Single    Spouse Name: N/A    Number of Children: N/A  . Years of Education: N/A   Occupational History  . unemployed    Social History Main Topics  . Smoking status: Current Every Day Smoker -- 0.50 packs/day for 25 years    Types: Cigarettes  . Smokeless tobacco: Not on file  . Alcohol Use: 0.0 oz/week     Comment: 6-8 beers a day  . Drug Use: Yes    Special: Cocaine, Marijuana     Comment: hx of marijuana and cocaine use last used cocaine 1 week ago  . Sexual Activity: Not Currently   Other Topics  Concern  . Not on file   Social History Narrative  . No narrative on file    Review of Systems: See HPI, otherwise negative ROS  Physical Exam: BP 140/85  Pulse 77  Temp(Src) 98.4 F (36.9 C) (Oral)  Ht 5\' 9"  (1.753 m)  Wt 174 lb (78.926 kg)  BMI 25.68 kg/m2 General:   Alert,  comfortable, pleasant., pleasant and cooperative in NAD the colonoscopy by his significant other Skin:  Intact without significant lesions or rashes. Eyes:  Sclera clear, no icterus.   Conjunctiva pink. Ears:  Normal auditory acuity. Nose:  No deformity, discharge,  or lesions. Mouth:  No deformity or  lesions. Neck:  Supple; no masses or thyromegaly. No significant cervical adenopathy. Lungs:  Clear throughout to auscultation.   No wheezes, crackles, or rhonchi. No acute distress. Heart:  Regular rate and rhythm; no murmurs, clicks, rubs,  or gallops. Abdomen: Non-distended, normal bowel sounds.  Soft and nontender without appreciable mass or hepatosplenomegaly.  Pulses:  Normal pulses noted. Extremities:  Without clubbing or edema.  Impression/Plan:  History festering acute on chronic pancreatitis. He has  pseudocysts which are stable and changes consistent with acute pancreatitis on the the most recent imaging.  Alcohol, by far, the most likely inciting factor with ischemia from cocaine an outside contributing factor. Triglycerides not an issue now a days. Likewise, I do not feel his gallstones are contributing to the clinical picture (particularly, when he continues to drink alcohol.  Therefore, I do not recommend cholecystectomy at this time as a helpful maneuver in combating his pancreatitis. He does not appear ill at all today.  Had a frank discussion with him regarding how his behavior is detrimental to his health. If he cannot refrain from using polysubstances, his overall health outlook will continue to be rather poor.   Recommendations: Polysubstance abuse cessation. Continue pancreatic enzymes  with meals and snacks. Continue concomitant PPI therapy. Office visit with [place] in 8 weeks. Narcotics to be refilled by PCP.

## 2013-08-27 NOTE — Telephone Encounter (Signed)
He can get 20 tablets of percocet on refill, this is it no further pain medications

## 2013-08-27 NOTE — Telephone Encounter (Signed)
Script printed out, signed and pt aware to pick up

## 2013-08-30 NOTE — Progress Notes (Signed)
Pt is aware of OV on 10/30 at 10 with [facility] and handed him appt card

## 2013-10-21 ENCOUNTER — Encounter: Payer: Self-pay | Admitting: Gastroenterology

## 2013-10-21 ENCOUNTER — Ambulatory Visit: Payer: Self-pay | Admitting: Gastroenterology

## 2013-10-21 ENCOUNTER — Telehealth: Payer: Self-pay | Admitting: Gastroenterology

## 2013-10-21 NOTE — Telephone Encounter (Signed)
Pt was a no show

## 2013-10-21 NOTE — Telephone Encounter (Signed)
MAILED LETTER °

## 2013-10-22 ENCOUNTER — Ambulatory Visit: Payer: Medicare Other | Admitting: Family Medicine

## 2015-04-07 ENCOUNTER — Telehealth: Payer: Self-pay | Admitting: Family Medicine

## 2015-04-07 NOTE — Telephone Encounter (Signed)
MD please advise

## 2015-04-07 NOTE — Telephone Encounter (Signed)
Patients mom [name] [name] calling to say that [name] [name] is in prison, but he is needing hip surgery, and the prison might let him come out of prison to get this done, and would like to know if dr Buelah Manis can recommend any one particular for this  Please call mom at [phone]

## 2015-04-07 NOTE — Telephone Encounter (Signed)
Pt has not been seen in almost 2 years, he would need OV before I can send him anywhere

## 2015-04-07 NOTE — Telephone Encounter (Signed)
Call placed to patient mother. [name] [name].

## 2015-04-10 NOTE — Telephone Encounter (Signed)
Multiple calls placed to patient mother with no answer and no return call.  Message to be closed.

## 2015-04-10 NOTE — Telephone Encounter (Signed)
Call placed to patient. LMTRC.  

## 2016-06-09 ENCOUNTER — Encounter (HOSPITAL_COMMUNITY): Payer: Self-pay | Admitting: Emergency Medicine

## 2016-06-09 ENCOUNTER — Emergency Department (HOSPITAL_COMMUNITY)
Admission: EM | Admit: 2016-06-09 | Discharge: 2016-06-09 | Disposition: A | Discharge status: Home / Self Care | Payer: Medicare Other | Attending: Emergency Medicine | Admitting: Emergency Medicine

## 2016-06-09 DIAGNOSIS — F319 Bipolar disorder, unspecified: Secondary | ICD-10-CM | POA: Insufficient documentation

## 2016-06-09 DIAGNOSIS — Z79891 Long term (current) use of opiate analgesic: Secondary | ICD-10-CM | POA: Insufficient documentation

## 2016-06-09 DIAGNOSIS — Z7984 Long term (current) use of oral hypoglycemic drugs: Secondary | ICD-10-CM | POA: Insufficient documentation

## 2016-06-09 DIAGNOSIS — Z79899 Other long term (current) drug therapy: Secondary | ICD-10-CM | POA: Insufficient documentation

## 2016-06-09 DIAGNOSIS — M1612 Unilateral primary osteoarthritis, left hip: Secondary | ICD-10-CM | POA: Insufficient documentation

## 2016-06-09 DIAGNOSIS — E785 Hyperlipidemia, unspecified: Secondary | ICD-10-CM | POA: Insufficient documentation

## 2016-06-09 DIAGNOSIS — Z791 Long term (current) use of non-steroidal anti-inflammatories (NSAID): Secondary | ICD-10-CM | POA: Insufficient documentation

## 2016-06-09 DIAGNOSIS — F17210 Nicotine dependence, cigarettes, uncomplicated: Secondary | ICD-10-CM | POA: Insufficient documentation

## 2016-06-09 MED ORDER — DEXAMETHASONE SODIUM PHOSPHATE 4 MG/ML IJ SOLN
8.0000 mg | Freq: Once | INTRAMUSCULAR | Status: AC
  Administered 2016-06-09: 8 mg via INTRAMUSCULAR
  Filled 2016-06-09: qty 2

## 2016-06-09 MED ORDER — IBUPROFEN 600 MG PO TABS: 600.0000 mg | ORAL_TABLET | Freq: Four times a day (QID) | ORAL | Status: DC

## 2016-06-09 MED ORDER — DIAZEPAM 5 MG PO TABS
10.0000 mg | ORAL_TABLET | Freq: Once | ORAL | Status: AC
  Administered 2016-06-09: 10 mg via ORAL
  Filled 2016-06-09: qty 2

## 2016-06-09 MED ORDER — KETOROLAC TROMETHAMINE 10 MG PO TABS
10.0000 mg | ORAL_TABLET | Freq: Once | ORAL | Status: AC
  Administered 2016-06-09: 10 mg via ORAL
  Filled 2016-06-09: qty 1

## 2016-06-09 MED ORDER — METHOCARBAMOL 500 MG PO TABS: 500.0000 mg | ORAL_TABLET | Freq: Three times a day (TID) | ORAL | Status: DC

## 2016-06-09 MED ORDER — DEXAMETHASONE 4 MG PO TABS: 4.0000 mg | ORAL_TABLET | Freq: Two times a day (BID) | ORAL | Status: DC

## 2016-06-09 NOTE — ED Provider Notes (Signed)
CSN: [id number]     Arrival date & time 06/09/16  0940 History  By signing my name below, I, Jasmyn B. Alexander, attest that this documentation has been prepared under the direction and in the presence of [employer] .  Electronically Signed: Tedra Coupe. Sheppard Coil, ED Scribe. 06/09/2016. 11:33 AM.    Chief Complaint  Patient presents with  . Hip Pain   Patient is a 55 y.o. male presenting with hip pain. The history is provided by the patient. No language interpreter was used.  Hip Pain This is a recurrent problem. The current episode started more than 2 days ago. The problem occurs constantly. The problem has been gradually worsening. The symptoms are aggravated by walking. Relieved by: Kasandra Knudsen and Ace Bandage.   HPI Comments: [name] [name] is a 55 y.o. male with PMHx of HTN and HLD who presents to the Emergency Department complaining of gradual onset, constant, worsening, aching left hip pain x 4 days. Pt states left hip pain initially began early 2015 due to bony decay and he recently received approval to have left hip replacement. He has not had any recent falls or fracture of the hip. Pt has associated diaphoresis. Pt states wrapping left leg with an ace bandage and walking with a cane helps mildly relieve pain while walking. He states sitting in low seating chairs aggravates pain. Denies any recent operations. Denies fever.   Past Medical History  Diagnosis Date  . Bronchitis   . Acid reflux   . Pancreatitis     March 2013  . Gout   . Bipolar disorder ([place])   . Hyperlipidemia   . Alcohol abuse     6-8 cans of beer daily  . Pseudocyst of pancreas 02/28/2012  . Arthritis   . Fracture of lower leg   . Hypertension   . Chronic pain   . Chronic neck pain   . Chronic back pain   . Left arm pain     chronic   Past Surgical History  Procedure Laterality Date  . Nose surgery      broken nose  . Circumcision  03/17/2012    Procedure: CIRCUMCISION ADULT;  Surgeon: Marissa Nestle,  MD;  Location: [facility];  Service: Urology;  Laterality: N/A;  . Colonoscopy with propofol  12/24/2012    [id number] lesion as described above status post biopsy; otherwise normal rectum/Sigmoid diverticulosis/Sigmoid polyps -- resected as described above. anal lesion, benign. colon polyp, hyperplastic  . Esophagogastroduodenoscopy (egd) with propofol  12/24/2012    [id number] erythema and erosions of uncertain significance status post gastric biopsy (reactive gastropathy NO h.pylori)   Family History  Problem Relation Age of Onset  . Diabetes Father   . Anesthesia problems Neg Hx   . Hypotension Neg Hx   . Malignant hyperthermia Neg Hx   . Pseudochol deficiency Neg Hx   . Colon cancer Neg Hx    Social History  Substance Use Topics  . Smoking status: Current Every Day Smoker -- 0.50 packs/day for 25 years    Types: Cigarettes  . Smokeless tobacco: None  . Alcohol Use: 0.0 oz/week     Comment: 6-8 beers a day    Review of Systems  Constitutional: Positive for diaphoresis. Negative for fatigue.  Musculoskeletal: Positive for myalgias and arthralgias.  All other systems reviewed and are negative.  Allergies  Penicillins  Home Medications   Prior to Admission medications   Medication Sig Start Date End Date Taking? Authorizing Provider  acetaminophen (TYLENOL)  650 MG CR tablet Take 650 mg by mouth 2 (two) times daily.   Yes Historical Provider, MD  amitriptyline (ELAVIL) 100 MG tablet Take 100 mg by mouth at bedtime.   Yes Historical Provider, MD  amLODipine (NORVASC) 5 MG tablet Take 1 tablet (5 mg total) by mouth daily. For high blood pressure control 07/21/13  Yes Alycia Rossetti, MD  chlorpheniramine (CHLOR-TRIMETON) 4 MG tablet Take 4 mg by mouth 3 (three) times daily.   Yes Historical Provider, MD  fish oil-omega-3 fatty acids 1000 MG capsule Take 1 capsule (1 g total) by mouth daily. For cholesterol control 03/19/13  Yes Encarnacion Slates, NP  gemfibrozil (LOPID) 600 MG tablet Take 1  tablet (600 mg total) by mouth 2 (two) times daily before a meal. Cholesterol control 07/21/13  Yes Alycia Rossetti, MD  ibuprofen (ADVIL,MOTRIN) 800 MG tablet Take 800 mg by mouth 3 (three) times daily.   Yes Historical Provider, MD  lactulose (CHRONULAC) 10 GM/15ML solution Take 20 g by mouth at bedtime.   Yes Historical Provider, MD  lisinopril (PRINIVIL,ZESTRIL) 5 MG tablet Take 5 mg by mouth daily.   Yes Historical Provider, MD  metFORMIN (GLUCOPHAGE) 1000 MG tablet Take 1,000 mg by mouth daily with breakfast.   Yes Historical Provider, MD  Multiple Vitamin (MULTIVITAMIN WITH MINERALS) TABS Take 1 tablet by mouth daily.   Yes Historical Provider, MD  omeprazole (PRILOSEC) 20 MG capsule Take 1 capsule (20 mg total) by mouth daily. For acid reflux 07/21/13  Yes Alycia Rossetti, MD  oxyCODONE-acetaminophen (PERCOCET/ROXICET) 5-325 MG per tablet Take 1 tablet by mouth every 6 (six) hours as needed for pain. 08/27/13  Yes Alycia Rossetti, MD  Pancrelipase, Lip-Prot-Amyl, (CREON) 36000 UNITS CPEP Take 72,000 Units by mouth 3 (three) times daily with meals. Take 2 capsules with meals and one with snacks. 08/04/13  Yes Mahala Menghini, PA-C  tamsulosin (FLOMAX) 0.4 MG CAPS Take 1 capsule (0.4 mg total) by mouth daily. For enlarged prostate 07/21/13  Yes Alycia Rossetti, MD   BP 132/83 mmHg  Pulse 91  Temp(Src) 98.9 F (37.2 C) (Oral)  Resp 18  Ht 5\' 10"  (1.778 m)  Wt 209 lb (94.802 kg)  BMI 29.99 kg/m2  SpO2 100% Physical Exam  Constitutional: He is oriented to person, place, and time. He appears well-developed and well-nourished.  HENT:  Head: Normocephalic and atraumatic.  Eyes: EOM are normal. Pupils are equal, round, and reactive to light.  Neck: Normal range of motion.  Cardiovascular: Normal rate, regular rhythm and normal heart sounds.   Pulmonary/Chest: Effort normal and breath sounds normal. No respiratory distress. He has no wheezes. He has no rales.  Abdominal: Soft. Bowel sounds are  normal. He exhibits no distension. There is no tenderness. There is no rebound and no guarding.  Musculoskeletal: Normal range of motion.  No deformity of the left hip. No effusion of knee or ankle on the left.   Neurological: He is alert and oriented to person, place, and time.  Weakness of the left hip. Inability to raise the left leg. (Not new)  Skin: Skin is warm and dry. No rash noted.  Left hip is not hot.  Psychiatric: He has a normal mood and affect. Judgment normal.  Nursing note and vitals reviewed.  ED Course  Procedures (including critical care time) DIAGNOSTIC STUDIES: Oxygen Saturation is 100% on RA, normal by my interpretation.    COORDINATION OF CARE: 11:29 AM-Discussed treatment plan which includes  IM Decadron, oral Valium and oral Toradol with pt at bedside and pt agreed to plan.    MDM  Pt has been evaluated by orthopedics at a [facility]. No new or emergent changes noted at this time. Rx for decadron, robaxin, and ibuprofen given to the patient. Pt also given information for Dr Aline Brochure for local orthopedic management.   Final diagnoses:  Primary osteoarthritis of left hip    *I have reviewed nursing notes, vital signs, and all appropriate lab and imaging results for this patient.*  **I personally performed the services described in this documentation, which was scribed in my presence. The recorded information has been reviewed and is accurate.Lily Kocher, PA-C 06/09/16 1153  Nat Christen, MD 06/10/16 [phone]

## 2016-06-09 NOTE — ED Notes (Signed)
Pt states his left hip needs to be replaced and has been hurting worse over the past few days with no injury

## 2016-06-09 NOTE — ED Notes (Signed)
Patient verbalizes understanding of discharge instructions, prescriptions, home care and follow up care. Patient out of department at this time. 

## 2016-06-09 NOTE — Discharge Instructions (Signed)
Please see your MD at the [facility] orthopedic office to finish the workup and management of your hip pain, or see Dr Aline Brochure. Use medications as suggested. Osteoarthritis Osteoarthritis is a disease that causes soreness and inflammation of a joint. It occurs when the cartilage at the affected joint wears down. Cartilage acts as a cushion, covering the ends of bones where they meet to form a joint. Osteoarthritis is the most common form of arthritis. It often occurs in older people. The joints affected most often by this condition include those in the:  Ends of the fingers.  Thumbs.  Neck.  Lower back.  Knees.  Hips. CAUSES  Over time, the cartilage that covers the ends of bones begins to wear away. This causes bone to rub on bone, producing pain and stiffness in the affected joints.  RISK FACTORS Certain factors can increase your chances of having osteoarthritis, including:  Older age.  Excessive body weight.  Overuse of joints.  Previous joint injury. SIGNS AND SYMPTOMS   Pain, swelling, and stiffness in the joint.  Over time, the joint may lose its normal shape.  Small deposits of bone (osteophytes) may grow on the edges of the joint.  Bits of bone or cartilage can break off and float inside the joint space. This may cause more pain and damage. DIAGNOSIS  Your health care provider will do a physical exam and ask about your symptoms. Various tests may be ordered, such as:  X-rays of the affected joint.  Blood tests to rule out other types of arthritis. Additional tests may be used to diagnose your condition. TREATMENT  Goals of treatment are to control pain and improve joint function. Treatment plans may include:  A prescribed exercise program that allows for rest and joint relief.  A weight control plan.  Pain relief techniques, such as:  Properly applied heat and cold.  Electric pulses delivered to nerve endings under the skin (transcutaneous electrical  nerve stimulation [TENS]).  Massage.  Certain nutritional supplements.  Medicines to control pain, such as:  Acetaminophen.  Nonsteroidal anti-inflammatory drugs (NSAIDs), such as naproxen.  Narcotic or central-acting agents, such as tramadol.  Corticosteroids. These can be given orally or as an injection.  Surgery to reposition the bones and relieve pain (osteotomy) or to remove loose pieces of bone and cartilage. Joint replacement may be needed in advanced states of osteoarthritis. HOME CARE INSTRUCTIONS   Take medicines only as directed by your health care provider.  Maintain a healthy weight. Follow your health care provider's instructions for weight control. This may include dietary instructions.  Exercise as directed. Your health care provider can recommend specific types of exercise. These may include:  Strengthening exercises. These are done to strengthen the muscles that support joints affected by arthritis. They can be performed with weights or with exercise bands to add resistance.  Aerobic activities. These are exercises, such as brisk walking or low-impact aerobics, that get your heart pumping.  Range-of-motion activities. These keep your joints limber.  Balance and agility exercises. These help you maintain daily living skills.  Rest your affected joints as directed by your health care provider.  Keep all follow-up visits as directed by your health care provider. SEEK MEDICAL CARE IF:   Your skin turns red.  You develop a rash in addition to your joint pain.  You have worsening joint pain.  You have a fever along with joint or muscle aches. SEEK IMMEDIATE MEDICAL CARE IF:  You have a significant loss  of weight or appetite.  You have night sweats. [place] of Arthritis and Musculoskeletal and Skin Diseases: www.niams.SouthExposed.es  [employer] on Aging: http://kim-miller.com/  American College of Rheumatology:  www.rheumatology.org   This information is not intended to replace advice given to you by your health care provider. Make sure you discuss any questions you have with your health care provider.   Document Released: 12/09/2005 Document Revised: 12/30/2014 Document Reviewed: 08/16/2013 Elsevier Interactive Patient Education [employer].

## 2016-06-12 ENCOUNTER — Encounter (HOSPITAL_COMMUNITY): Payer: Self-pay | Admitting: Emergency Medicine

## 2016-06-12 ENCOUNTER — Emergency Department (HOSPITAL_COMMUNITY): Payer: Medicare Other

## 2016-06-12 ENCOUNTER — Emergency Department (HOSPITAL_COMMUNITY)
Admission: EM | Admit: 2016-06-12 | Discharge: 2016-06-12 | Disposition: A | Discharge status: Home / Self Care | Payer: Medicare Other | Attending: Emergency Medicine | Admitting: Emergency Medicine

## 2016-06-12 DIAGNOSIS — M87850 Other osteonecrosis, pelvis: Secondary | ICD-10-CM | POA: Insufficient documentation

## 2016-06-12 DIAGNOSIS — G8929 Other chronic pain: Secondary | ICD-10-CM

## 2016-06-12 DIAGNOSIS — M25552 Pain in left hip: Secondary | ICD-10-CM | POA: Diagnosis present

## 2016-06-12 DIAGNOSIS — Z7984 Long term (current) use of oral hypoglycemic drugs: Secondary | ICD-10-CM | POA: Diagnosis not present

## 2016-06-12 DIAGNOSIS — M1990 Unspecified osteoarthritis, unspecified site: Secondary | ICD-10-CM | POA: Diagnosis not present

## 2016-06-12 DIAGNOSIS — F17210 Nicotine dependence, cigarettes, uncomplicated: Secondary | ICD-10-CM | POA: Insufficient documentation

## 2016-06-12 DIAGNOSIS — W010XXA Fall on same level from slipping, tripping and stumbling without subsequent striking against object, initial encounter: Secondary | ICD-10-CM

## 2016-06-12 DIAGNOSIS — I10 Essential (primary) hypertension: Secondary | ICD-10-CM | POA: Diagnosis not present

## 2016-06-12 DIAGNOSIS — W19XXXA Unspecified fall, initial encounter: Secondary | ICD-10-CM

## 2016-06-12 DIAGNOSIS — M87052 Idiopathic aseptic necrosis of left femur: Secondary | ICD-10-CM

## 2016-06-12 DIAGNOSIS — E785 Hyperlipidemia, unspecified: Secondary | ICD-10-CM | POA: Insufficient documentation

## 2016-06-12 DIAGNOSIS — F319 Bipolar disorder, unspecified: Secondary | ICD-10-CM | POA: Insufficient documentation

## 2016-06-12 LAB — CBG MONITORING, ED: Glucose-Capillary: 187 mg/dL — ABNORMAL HIGH (ref 65–99)

## 2016-06-12 MED ORDER — KETOROLAC TROMETHAMINE 60 MG/2ML IM SOLN
60.0000 mg | Freq: Once | INTRAMUSCULAR | Status: AC
  Administered 2016-06-12: 60 mg via INTRAMUSCULAR
  Filled 2016-06-12: qty 2

## 2016-06-12 MED ORDER — METHOCARBAMOL 500 MG PO TABS
500.0000 mg | ORAL_TABLET | Freq: Once | ORAL | Status: DC
  Filled 2016-06-12: qty 1

## 2016-06-12 NOTE — Discharge Instructions (Signed)
1. Medications: home medications for pain control 2. Treatment: rest, drink plenty of fluids,  3. Follow Up: Please followup orthopedics as directed in 7-10 days for discussion of your diagnoses and further evaluation after today's visit   Avascular Necrosis Avascular necrosis is a disease resulting from the temporary or permanent loss of blood supply to a bone. This disease may also be known as:   Osteonecrosis.   Aseptic necrosis.   Ischemic bone necrosis. Without proper blood supply, the internal layer of the affected bone dies and the outer layer of the bone may break down. If this process affects a bone near a joint, it may lead to collapse of that joint. Common bones that are affected by this condition include:  The top of your thigh bone (femoral head).  One or more bones in your wrist (scaphoid orlunate).  One or more bones in your foot (metatarsals).  One of the bones in your ankle (navicular). The joint most commonly affected by this condition is the hip joint. Avascular necrosis rarely occurs in more than one bone at a time.  CAUSES   Damage or injury to a bone or joint.  Using corticosteroid medicine for a long period of time.  Changes in your immune or hormone systems.  Excessive exposure to radiation. RISK FACTORS  Alcohol abuse.  Previous traumatic injury to a joint.  Using corticosteroid medicines for a long period of time or often.  Having a medical condition such as:  HIV or AIDS.   Diabetes.   Sickle cell disease.  An autoimmune disease. SIGNS AND SYMPTOMS  The main symptoms of avascular necrosis are pain and decreased motion in the affected bone or joint. In the early stages the pain may be minor and occur only with activity. As avascular necrosis progresses, pain may gradually worsen and occur while at rest. The pain may suddenly become severe if an affected joint collapses. DIAGNOSIS  Avascular necrosis may be diagnosed with:   A  medical history.  A physical exam.   X-rays.  An MRI.  A bone scan. TREATMENT  Treatments may include:  Medicine to help relieve pain.  Avoiding placing any pressure or weight ontheaffected area. If avascular necrosis occurs in your hip, ankle, or foot, you may be instructed to use crutches or a rolling scooter.  Surgery, such as:   Core decompression. In this surgery, one or more holes are placed in the bone for new blood vessels to grow into. This provides a renewed blood supply to the bone. Core decompression can often reduce pain and pressure in the affected bone and slow the progression of bone and joint destruction.  Osteotomy. In this surgery, the bone is reshaped to reduce stress on the affected area of the joint.   Bone grafting. In this surgery, healthy bone from one part of your body is transplanted to the affected area.   Arthroplasty. Arthroplasty is also known as total joint replacement. In this surgery, the affected surface on one or both sides of a joint is replaced with artificial parts (prostheses).  Electrical stimulation. This may help encourage new bone growth. HOME CARE INSTRUCTIONS  Take medicines only as directed by your health care provider.   Follow your health care provider's recommendations on limiting activities or using crutches to rest your affected joint.   Meet with aphysical therapist as directed by your health care provider.   Keep all follow-up visits as directed by your health care provider. This is important. [place]  IF:   Your pain worsens.  You have decreased motion in your affected joint. SEEK IMMEDIATE MEDICAL CARE IF:  Your pain suddenly becomes severe.   This information is not intended to replace advice given to you by your health care provider. Make sure you discuss any questions you have with your health care provider.   Document Released: 05/31/2002 Document Revised: 12/30/2014 Document Reviewed:  02/16/2014 Elsevier Interactive Patient Education [employer].

## 2016-06-12 NOTE — ED Notes (Signed)
Chronic lt hip pain since 2015.  Reports he has been told by Dr that he needs hip replacement.  States he tripped over drop cord tonight and landed on lt side.

## 2016-06-12 NOTE — ED Notes (Signed)
Pt ambulated in hallway for several steps, able to bear weight with mild hop/limp and with the use of his cane.  Pt was able to get back and onto the bed with minimal assist

## 2016-06-12 NOTE — ED Provider Notes (Signed)
CSN: [id number]     Arrival date & time 06/12/16  2101 History   First MD Initiated Contact with Patient 06/12/16 2133     Chief Complaint  Patient presents with  . Hip Pain     (Consider location/radiation/quality/duration/timing/severity/associated sxs/prior Treatment) The history is provided by the patient and medical records. No language interpreter was used.   [name] [name] is a 55 y.o. male  with a hx of HTN, HLD, bipolar disorder, EtOH abuse, chronic pain presents to the Emergency Department complaining of acute exacerbation of his chronic left hip pain worsened several hours ago after tripping and falling onto the left hip.  Pt reports he needs a hip replacement but "doesn't have time."  He is taking percocet and ibuprofen at home.  Last dose 6 pm.  Pt reports he was seen a few days ago for the same and was given "a shot" but this wore off and his pain came back.  Pt denies numbness, weakness, loss of bowel or bladder control.     Past Medical History  Diagnosis Date  . Bronchitis   . Acid reflux   . Pancreatitis     March 2013  . Gout   . Bipolar disorder ([place])   . Hyperlipidemia   . Alcohol abuse     6-8 cans of beer daily  . Pseudocyst of pancreas 02/28/2012  . Arthritis   . Fracture of lower leg   . Hypertension   . Chronic pain   . Chronic neck pain   . Chronic back pain   . Left arm pain     chronic   Past Surgical History  Procedure Laterality Date  . Nose surgery      broken nose  . Circumcision  03/17/2012    Procedure: CIRCUMCISION ADULT;  Surgeon: Marissa Nestle, MD;  Location: [facility];  Service: Urology;  Laterality: N/A;  . Colonoscopy with propofol  12/24/2012    [id number] lesion as described above status post biopsy; otherwise normal rectum/Sigmoid diverticulosis/Sigmoid polyps -- resected as described above. anal lesion, benign. colon polyp, hyperplastic  . Esophagogastroduodenoscopy (egd) with propofol  12/24/2012    [id number] erythema and erosions of  uncertain significance status post gastric biopsy (reactive gastropathy NO h.pylori)   Family History  Problem Relation Age of Onset  . Diabetes Father   . Anesthesia problems Neg Hx   . Hypotension Neg Hx   . Malignant hyperthermia Neg Hx   . Pseudochol deficiency Neg Hx   . Colon cancer Neg Hx    Social History  Substance Use Topics  . Smoking status: Current Every Day Smoker -- 0.50 packs/day for 25 years    Types: Cigarettes  . Smokeless tobacco: None  . Alcohol Use: 0.0 oz/week     Comment: 6-8 beers a day    Review of Systems  Constitutional: Negative for fever and chills.  Gastrointestinal: Negative for nausea and vomiting.  Musculoskeletal: Positive for arthralgias (Left hip). Negative for back pain, joint swelling, neck pain and neck stiffness.  Skin: Negative for wound.  Neurological: Negative for numbness.  Hematological: Does not bruise/bleed easily.  Psychiatric/Behavioral: The patient is not nervous/anxious.   All other systems reviewed and are negative.     Allergies  Penicillins  Home Medications   Prior to Admission medications   Medication Sig Start Date End Date Taking? Authorizing Provider  acetaminophen (TYLENOL) 650 MG CR tablet Take 650 mg by mouth 2 (two) times daily.    Historical Provider, MD  amitriptyline (ELAVIL) 100 MG tablet Take 100 mg by mouth at bedtime.    Historical Provider, MD  amLODipine (NORVASC) 5 MG tablet Take 1 tablet (5 mg total) by mouth daily. For high blood pressure control 07/21/13   Alycia Rossetti, MD  chlorpheniramine (CHLOR-TRIMETON) 4 MG tablet Take 4 mg by mouth 3 (three) times daily.    Historical Provider, MD  dexamethasone (DECADRON) 4 MG tablet Take 1 tablet (4 mg total) by mouth 2 (two) times daily with a meal. 06/09/16   Lily Kocher, PA-C  fish oil-omega-3 fatty acids 1000 MG capsule Take 1 capsule (1 g total) by mouth daily. For cholesterol control 03/19/13   Encarnacion Slates, NP  gemfibrozil (LOPID) 600 MG  tablet Take 1 tablet (600 mg total) by mouth 2 (two) times daily before a meal. Cholesterol control 07/21/13   Alycia Rossetti, MD  ibuprofen (ADVIL,MOTRIN) 600 MG tablet Take 1 tablet (600 mg total) by mouth 4 (four) times daily. Take this medication with food 06/09/16   Lily Kocher, PA-C  lactulose (CHRONULAC) 10 GM/15ML solution Take 20 g by mouth at bedtime.    Historical Provider, MD  lisinopril (PRINIVIL,ZESTRIL) 5 MG tablet Take 5 mg by mouth daily.    Historical Provider, MD  metFORMIN (GLUCOPHAGE) 1000 MG tablet Take 1,000 mg by mouth daily with breakfast.    Historical Provider, MD  methocarbamol (ROBAXIN) 500 MG tablet Take 1 tablet (500 mg total) by mouth 3 (three) times daily. 06/09/16   Lily Kocher, PA-C  Multiple Vitamin (MULTIVITAMIN WITH MINERALS) TABS Take 1 tablet by mouth daily.    Historical Provider, MD  omeprazole (PRILOSEC) 20 MG capsule Take 1 capsule (20 mg total) by mouth daily. For acid reflux 07/21/13   Alycia Rossetti, MD  oxyCODONE-acetaminophen (PERCOCET/ROXICET) 5-325 MG per tablet Take 1 tablet by mouth every 6 (six) hours as needed for pain. 08/27/13   Alycia Rossetti, MD  Pancrelipase, Lip-Prot-Amyl, (CREON) 36000 UNITS CPEP Take 72,000 Units by mouth 3 (three) times daily with meals. Take 2 capsules with meals and one with snacks. 08/04/13   Mahala Menghini, PA-C  tamsulosin (FLOMAX) 0.4 MG CAPS Take 1 capsule (0.4 mg total) by mouth daily. For enlarged prostate 07/21/13   Alycia Rossetti, MD   BP 127/61 mmHg  Pulse 107  Temp(Src) 98.7 F (37.1 C) (Oral)  Resp 20  Ht 5\' 10"  (1.778 m)  Wt 94.802 kg  BMI 29.99 kg/m2  SpO2 96% Physical Exam  Constitutional: He appears well-developed and well-nourished. No distress.  HENT:  Head: Normocephalic and atraumatic.  Mouth/Throat: Oropharynx is clear and moist. No oropharyngeal exudate.  Eyes: Conjunctivae are normal.  Neck: Normal range of motion. Neck supple.  Full ROM without pain  Cardiovascular: Normal rate,  regular rhythm and intact distal pulses.   Pulmonary/Chest: Effort normal and breath sounds normal. No respiratory distress. He has no wheezes.  Abdominal: Soft. He exhibits no distension. There is no tenderness.  Musculoskeletal:  Full range of motion of the T-spine and L-spine No midline tenderness to the  T-spine or L-spine Tenderness to palpation of the left paraspinous muscles of the L-spine Left Hip: Decreased flexion and almost full extension limited due to pain; TTP of the lateral hip and groin; no lymphadenopathy; no palpable deformity; no discoloration; 4/5 strength due to pain Left knee:  FROM without pain Left Ankle: FROM without pain  Lymphadenopathy:    He has no cervical adenopathy.  Neurological: He is alert. He  has normal reflexes.  Reflex Scores:      Bicep reflexes are 2+ on the right side and 2+ on the left side.      Brachioradialis reflexes are 2+ on the right side and 2+ on the left side.      Patellar reflexes are 2+ on the right side and 2+ on the left side.      Achilles reflexes are 2+ on the right side and 2+ on the left side. Speech is clear and goal oriented, follows commands Normal 5/5 strength in upper and lower extremities bilaterally including dorsiflexion and plantar flexion, strong and equal grip strength Sensation normal to light and sharp touch Moves extremities without ataxia, coordination intact Antalgic gait with cane ** Normal balance No Clonus   Skin: Skin is warm and dry. No rash noted. He is not diaphoretic. No erythema.  Psychiatric: He has a normal mood and affect. His behavior is normal.  Nursing note and vitals reviewed.   ED Course  Procedures (including critical care time) Labs Review Labs Reviewed  CBG MONITORING, ED - Abnormal; Notable for the following:    Glucose-Capillary 187 (*)    All other components within normal limits    Imaging Review Dg Hip Unilat With Pelvis 2-3 Views Left  06/12/2016  CLINICAL DATA:  Tripped  over drop cord and fell, with worsening left hip pain. Initial encounter. EXAM: DG HIP (WITH OR WITHOUT PELVIS) 2-3V LEFT COMPARISON:  CT of the abdomen and pelvis performed 08/06/2013 FINDINGS: There is new collapse of the left femoral head, likely reflecting avascular necrosis, with significant left femoral head deformity and mild superior and lateral displacement. Associated subcortical cystic change is noted at the left superior acetabulum. The right hip joint is unremarkable in appearance. The sacroiliac joints are grossly unremarkable. The visualized bowel gas pattern is within normal limits. No definite soft tissue abnormalities are characterized on radiograph. IMPRESSION: New collapse of the left femoral head, likely reflecting significant avascular necrosis, with left femoral head deformity and mild superior and lateral displacement. Associated subcortical cystic change at the left superior acetabulum. The most recent studies available for comparison are from 2014. Electronically Signed   By: Garald Balding M.D.   On: 06/12/2016 21:59   I have personally reviewed and evaluated these images and lab results as part of my medical decision-making.    MDM   Final diagnoses:  Fall from slip, trip, or stumble, initial encounter  Chronic left hip pain  Avascular necrosis of bone of left hip ([place])   Hilaria Ota presents with acute on chronic left hip pain.  Pt reports recent MRI of the left hip but I am unable to access this in the system.  Plain films today show new collapse of the left femoral head likely 2/2 avascular necrosis, however these films are compared to a set from 2014.    No acute fracture noted.  Pt given Toradol as he had taken oxycodone and robaxin prior to arrival.  Pt is able to ambulate with pain.  Pt given referral to ortho for further management.  Will not write narcotic Rx at this time as he has meds at home and additional narcotics are not safe at this time.    Pt with mild  tachycardia on triage vitals, but none on my clinical exam.    Abigail Butts, PA-C 06/12/16 [place], MD 06/12/16 2356

## 2016-07-30 ENCOUNTER — Ambulatory Visit (INDEPENDENT_AMBULATORY_CARE_PROVIDER_SITE_OTHER): Payer: Medicare Other

## 2016-07-30 ENCOUNTER — Ambulatory Visit (INDEPENDENT_AMBULATORY_CARE_PROVIDER_SITE_OTHER): Payer: Medicare Other | Admitting: Orthopedic Surgery

## 2016-07-30 ENCOUNTER — Encounter: Payer: Self-pay | Admitting: Orthopedic Surgery

## 2016-07-30 VITALS — BP 122/84 | HR 100 | Ht 70.0 in | Wt 200.0 lb

## 2016-07-30 DIAGNOSIS — M87052 Idiopathic aseptic necrosis of left femur: Secondary | ICD-10-CM

## 2016-07-30 DIAGNOSIS — M1990 Unspecified osteoarthritis, unspecified site: Secondary | ICD-10-CM | POA: Diagnosis not present

## 2016-07-30 DIAGNOSIS — M1612 Unilateral primary osteoarthritis, left hip: Secondary | ICD-10-CM

## 2016-07-30 DIAGNOSIS — M5442 Lumbago with sciatica, left side: Secondary | ICD-10-CM

## 2016-07-30 MED ORDER — GABAPENTIN 300 MG PO CAPS: 300.0000 mg | ORAL_CAPSULE | Freq: Three times a day (TID) | ORAL | 5 refills | Status: DC

## 2016-07-30 NOTE — Progress Notes (Signed)
Chief Complaint  Patient presents with  . New Patient (Initial Visit)    Left Hip Pain   HPI  55 year old male with increasing pain in his left lower back and radiating down his left leg and long-standing left groin pain and anterior thigh pain presents for evaluation and treatment.  He was a heavy alcohol user in the past has since calmed down. His x-rays and CT scan show severe collapse of the left femoral head consistent with avascular necrosis most likely related to alcohol use  He really has 2 separate issues.  #1 lower back pain with radicular symptoms into the left foot which is unknown burning sensation  #2 groin pain and anterior thigh pain which is dull ache associated with decreased range of motion and weakness 5 flexion.  He currently has a rolling walker as well as a cane  Review of Systems  Constitutional: Negative for chills, fever, malaise/fatigue and weight loss.  Musculoskeletal: Positive for back pain, joint pain and myalgias.  Neurological: Positive for tingling and sensory change.    Past Medical History:  Diagnosis Date  . Acid reflux   . Alcohol abuse    6-8 cans of beer daily  . Arthritis   . Bipolar disorder ([place])   . Bronchitis   . Chronic back pain   . Chronic neck pain   . Chronic pain   . Fracture of lower leg   . Gout   . Hyperlipidemia   . Hypertension   . Left arm pain    chronic  . Pancreatitis    March 2013  . Pseudocyst of pancreas 02/28/2012    Past Surgical History:  Procedure Laterality Date  . CIRCUMCISION  03/17/2012   Procedure: CIRCUMCISION ADULT;  Surgeon: Marissa Nestle, MD;  Location: [facility];  Service: Urology;  Laterality: N/A;  . COLONOSCOPY WITH PROPOFOL  12/24/2012   [id number] lesion as described above status post biopsy; otherwise normal rectum/Sigmoid diverticulosis/Sigmoid polyps -- resected as described above. anal lesion, benign. colon polyp, hyperplastic  . ESOPHAGOGASTRODUODENOSCOPY (EGD) WITH PROPOFOL  12/24/2012    [id number] erythema and erosions of uncertain significance status post gastric biopsy (reactive gastropathy NO h.pylori)  . NOSE SURGERY     broken nose   Family History  Problem Relation Age of Onset  . Diabetes Father   . Anesthesia problems Neg Hx   . Hypotension Neg Hx   . Malignant hyperthermia Neg Hx   . Pseudochol deficiency Neg Hx   . Colon cancer Neg Hx    Social History  Substance Use Topics  . Smoking status: Current Every Day Smoker    Packs/day: 0.50    Years: 25.00    Types: Cigarettes  . Smokeless tobacco: Not on file  . Alcohol use 0.0 oz/week     Comment: 6-8 beers a day    Current Outpatient Prescriptions:  .  acetaminophen (TYLENOL) 650 MG CR tablet, Take 650 mg by mouth 2 (two) times daily., Disp: , Rfl:  .  amitriptyline (ELAVIL) 100 MG tablet, Take 100 mg by mouth at bedtime., Disp: , Rfl:  .  amLODipine (NORVASC) 5 MG tablet, Take 1 tablet (5 mg total) by mouth daily. For high blood pressure control, Disp: 30 tablet, Rfl: 3 .  chlorpheniramine (CHLOR-TRIMETON) 4 MG tablet, Take 4 mg by mouth 3 (three) times daily., Disp: , Rfl:  .  dexamethasone (DECADRON) 4 MG tablet, Take 1 tablet (4 mg total) by mouth 2 (two) times daily with a meal., Disp:  12 tablet, Rfl: 0 .  fish oil-omega-3 fatty acids 1000 MG capsule, Take 1 capsule (1 g total) by mouth daily. For cholesterol control, Disp: , Rfl:  .  gemfibrozil (LOPID) 600 MG tablet, Take 1 tablet (600 mg total) by mouth 2 (two) times daily before a meal. Cholesterol control, Disp: 60 tablet, Rfl: 3 .  ibuprofen (ADVIL,MOTRIN) 600 MG tablet, Take 1 tablet (600 mg total) by mouth 4 (four) times daily. Take this medication with food, Disp: 30 tablet, Rfl: 0 .  lactulose (CHRONULAC) 10 GM/15ML solution, Take 20 g by mouth at bedtime., Disp: , Rfl:  .  lisinopril (PRINIVIL,ZESTRIL) 5 MG tablet, Take 5 mg by mouth daily., Disp: , Rfl:  .  metFORMIN (GLUCOPHAGE) 1000 MG tablet, Take 1,000 mg by mouth daily with  breakfast., Disp: , Rfl:  .  methocarbamol (ROBAXIN) 500 MG tablet, Take 1 tablet (500 mg total) by mouth 3 (three) times daily., Disp: 21 tablet, Rfl: 0 .  Multiple Vitamin (MULTIVITAMIN WITH MINERALS) TABS, Take 1 tablet by mouth daily., Disp: , Rfl:  .  omeprazole (PRILOSEC) 20 MG capsule, Take 1 capsule (20 mg total) by mouth daily. For acid reflux, Disp: 30 capsule, Rfl: 3 .  oxyCODONE-acetaminophen (PERCOCET/ROXICET) 5-325 MG per tablet, Take 1 tablet by mouth every 6 (six) hours as needed for pain., Disp: 20 tablet, Rfl: 0 .  Pancrelipase, Lip-Prot-Amyl, (CREON) 36000 UNITS CPEP, Take 72,000 Units by mouth 3 (three) times daily with meals. Take 2 capsules with meals and one with snacks., Disp: 240 capsule, Rfl: 3 .  tamsulosin (FLOMAX) 0.4 MG CAPS, Take 1 capsule (0.4 mg total) by mouth daily. For enlarged prostate, Disp: 30 capsule, Rfl: 6  BP 122/84   Pulse 100   Ht 5\' 10"  (1.778 m)   Wt 200 lb (90.7 kg)   BMI 28.70 kg/m   Physical Exam  Constitutional: He is oriented to person, place, and time. He appears well-developed and well-nourished. No distress.  Cardiovascular: Normal rate and intact distal pulses.   Neurological: He is alert and oriented to person, place, and time.  Skin: Skin is warm and dry. No rash noted. He is not diaphoretic. No erythema. No pallor.  Psychiatric: He has a normal mood and affect. His behavior is normal. Judgment and thought content normal.    Ortho Exam Right lower extremity is well aligned range of motion is full in terms of hip and knee hip and knee are stable he has normal strength in the right leg as normal skin on the right side he has good pulses no edema normal sensation and no enlarged nodes in the groin  On the left side the hip flexion is only to 90 this is painful he has obligatory external rotation of the hip in extension and flexion with limited internal rotation. The hip is stable he has weakness of his hip flexors grade 2 out of 5 skin  is normal pulses are good no edema and sensation is normal has no lymph nodes enlarged there  He does have tenderness in the lower back and the left side of the back  Deep tendon reflexes are 2+ pin prick test was normal and straight leg raises were negative and balance assessment was not performed    ASSESSMENT: My personal interpretation of the images:  Images from the hospital show complete collapse of the left femoral head and severe secondary arthritis consistent with avascular necrosis  I took x-rays of his lumbar spine and they show very  minimal degenerative changes some mild coronal plane asymmetry which I think is reactive.  PLAN #1 referral for anterior approach total hip  #2 to address his back pain continue ibuprofen and Robaxin.    Arther Abbott, MD 07/30/2016 10:53 AM

## 2016-07-30 NOTE — Progress Notes (Signed)
I am shot left hip  40 mg Depo-Medrol 3 mL of 1% lidocaine tolerated well no complications  Patient given injection for left hip pain and arthritis avascular necrosis of the hip and lower back pain with left leg sciatica

## 2016-07-31 ENCOUNTER — Other Ambulatory Visit: Payer: Self-pay | Admitting: *Deleted

## 2016-07-31 DIAGNOSIS — M8700 Idiopathic aseptic necrosis of unspecified bone: Secondary | ICD-10-CM

## 2016-08-06 ENCOUNTER — Telehealth: Payer: Self-pay | Admitting: Orthopedic Surgery

## 2016-08-06 NOTE — Telephone Encounter (Signed)
Patient called and stating that he was having a lot of pain and that the pain medication he has is not working.  He has an appointment with Dr. Ninfa Linden on 08-21-16.  Please advise patient as to what he needs to do.  Thanks

## 2016-08-06 NOTE — Telephone Encounter (Signed)
Left message for patient to return my call.

## 2016-08-06 NOTE — Telephone Encounter (Signed)
Routing to Dr. Harrison to advise 

## 2016-08-06 NOTE — Telephone Encounter (Signed)
Same opioid  Add ibuprofen 800 mg q 6   Final answer

## 2016-08-07 ENCOUNTER — Telehealth: Payer: Self-pay | Admitting: Orthopedic Surgery

## 2016-08-07 NOTE — Telephone Encounter (Signed)
Patient returned your call, please call him again.  Thanks

## 2016-08-08 NOTE — Telephone Encounter (Signed)
Called patient, no answer 

## 2016-08-08 NOTE — Telephone Encounter (Signed)
Returned call no answer, left message

## 2016-08-21 ENCOUNTER — Other Ambulatory Visit (HOSPITAL_COMMUNITY): Payer: Self-pay | Admitting: Physician Assistant

## 2016-08-21 DIAGNOSIS — M7989 Other specified soft tissue disorders: Principal | ICD-10-CM

## 2016-08-21 DIAGNOSIS — M79662 Pain in left lower leg: Secondary | ICD-10-CM

## 2016-08-22 ENCOUNTER — Ambulatory Visit (HOSPITAL_COMMUNITY)
Admission: RE | Admit: 2016-08-22 | Discharge: 2016-08-22 | Disposition: A | Discharge status: Home / Self Care | Payer: Medicare Other | Source: Ambulatory Visit | Attending: Physician Assistant | Admitting: Physician Assistant

## 2016-08-22 DIAGNOSIS — M79662 Pain in left lower leg: Secondary | ICD-10-CM

## 2016-08-22 DIAGNOSIS — M79605 Pain in left leg: Secondary | ICD-10-CM | POA: Insufficient documentation

## 2016-08-22 DIAGNOSIS — M7989 Other specified soft tissue disorders: Secondary | ICD-10-CM

## 2016-09-02 ENCOUNTER — Encounter (HOSPITAL_COMMUNITY): Payer: Self-pay | Admitting: Emergency Medicine

## 2016-09-02 ENCOUNTER — Emergency Department (HOSPITAL_COMMUNITY)
Admission: EM | Admit: 2016-09-02 | Discharge: 2016-09-02 | Disposition: A | Discharge status: Home / Self Care | Payer: Medicare Other | Attending: Emergency Medicine | Admitting: Emergency Medicine

## 2016-09-02 DIAGNOSIS — I10 Essential (primary) hypertension: Secondary | ICD-10-CM | POA: Diagnosis not present

## 2016-09-02 DIAGNOSIS — F17210 Nicotine dependence, cigarettes, uncomplicated: Secondary | ICD-10-CM | POA: Diagnosis not present

## 2016-09-02 DIAGNOSIS — M25552 Pain in left hip: Secondary | ICD-10-CM | POA: Insufficient documentation

## 2016-09-02 DIAGNOSIS — M25452 Effusion, left hip: Secondary | ICD-10-CM | POA: Diagnosis not present

## 2016-09-02 LAB — CBG MONITORING, ED: Glucose-Capillary: 118 mg/dL — ABNORMAL HIGH (ref 65–99)

## 2016-09-02 MED ORDER — KETOROLAC TROMETHAMINE 60 MG/2ML IM SOLN
60.0000 mg | Freq: Once | INTRAMUSCULAR | Status: AC
  Administered 2016-09-02: 60 mg via INTRAMUSCULAR
  Filled 2016-09-02: qty 2

## 2016-09-02 MED ORDER — METHYLPREDNISOLONE SODIUM SUCC 125 MG IJ SOLR
125.0000 mg | Freq: Once | INTRAMUSCULAR | Status: AC
  Administered 2016-09-02: 125 mg via INTRAMUSCULAR
  Filled 2016-09-02: qty 2

## 2016-09-02 MED ORDER — HYDROCODONE-ACETAMINOPHEN 5-325 MG PO TABS: 2.0000 | ORAL_TABLET | ORAL | 0 refills | Status: DC | PRN

## 2016-09-02 MED ORDER — METHOCARBAMOL 500 MG PO TABS: 500.0000 mg | ORAL_TABLET | Freq: Two times a day (BID) | ORAL | 0 refills | Status: DC

## 2016-09-02 NOTE — ED Triage Notes (Signed)
Pt has left hip pain radiating down left leg x2 years, no injury.  Pt reports he needs a hip replacement.  Pt alert and oriented.

## 2016-09-02 NOTE — ED Provider Notes (Signed)
[facility] DEPT Provider Note   CSN: [id number] Arrival date & time: 09/02/16  1019  By signing my name below, I, [name] [name], attest that this documentation has been prepared under the direction and in the presence of [name] [name]. Electronically Signed: Rayna [name], ED Scribe. 09/02/16. 11:20 AM.   History   Chief Complaint No chief complaint on file.   HPI HPI Comments: [name] [name] is a 55 y.o. male who presents to the Emergency Department complaining of constant, sharp, moderate, left hip pain x 4 days ago. Pt states he is experiencing mild swelling in the region and had an US performed 2-3 weeks ago to r/o DVT in the region and it came back negative. He is followed at [facility] by Dr. Ninfa Linden and states he needs to have a total left hip arthroplasty. His next f/u appointment is on 09/18/2016. He ran out of his hydrocodone 5-325 and has been taking 800 mg ibuprofen w/o significant relief. He confirms his current symptoms are consistent with prior hip exacerbations. He denies other associated symptoms at this time.   The history is provided by the patient. No language interpreter was used.    Past Medical History:  Diagnosis Date  . Acid reflux   . Alcohol abuse    6-8 cans of beer daily  . Arthritis   . Bipolar disorder ([place])   . Bronchitis   . Chronic back pain   . Chronic neck pain   . Chronic pain   . Fracture of lower leg   . Gout   . Hyperlipidemia   . Hypertension   . Left arm pain    chronic  . Pancreatitis    March 2013  . Pseudocyst of pancreas 02/28/2012    Patient Active Problem List   Diagnosis Date Noted  . Rash and nonspecific skin eruption 08/12/2013  . Cholelithiases 08/04/2013  . Subcutaneous nodule 07/21/2013  . Acute alcoholic pancreatitis [id number]  . Syncope 04/11/2013  . Prolonged Q-T interval on ECG 04/11/2013  . Cocaine abuse 03/13/2013    Class: Chronic  . Polysubstance abuse 02/14/2013  . DDD (degenerative  disc disease), lumbar 02/01/2013  . Dyspepsia 12/01/2012  . BPH (benign prostatic hyperplasia) 10/07/2012  . Allergic rhinitis 10/03/2012  . Transaminitis 10/02/2012  . Chronic back pain 10/02/2012  . Essential hypertension, benign 07/01/2012  . Tobacco user 07/01/2012  . Hepatic steatosis 03/06/2012  . ED (erectile dysfunction) 03/06/2012  . Pseudocyst of pancreas 02/28/2012  . Alcohol abuse 02/25/2012  . Allergic rhinitis 02/23/2012  . GERD (gastroesophageal reflux disease) 02/23/2012  . Bipolar 1 disorder ([place]) 02/23/2012  . Hypertriglyceridemia 12/05/2011    Past Surgical History:  Procedure Laterality Date  . CIRCUMCISION  03/17/2012   Procedure: CIRCUMCISION ADULT;  Surgeon: Marissa Nestle, MD;  Location: [facility];  Service: Urology;  Laterality: N/A;  . COLONOSCOPY WITH PROPOFOL  12/24/2012   [id number] lesion as described above status post biopsy; otherwise normal rectum/Sigmoid diverticulosis/Sigmoid polyps -- resected as described above. anal lesion, benign. colon polyp, hyperplastic  . ESOPHAGOGASTRODUODENOSCOPY (EGD) WITH PROPOFOL  12/24/2012   [id number] erythema and erosions of uncertain significance status post gastric biopsy (reactive gastropathy NO h.pylori)  . NOSE SURGERY     broken nose       Home Medications    Prior to Admission medications   Medication Sig Start Date End Date Taking? Authorizing Provider  acetaminophen (TYLENOL) 650 MG CR tablet Take 650 mg by mouth 2 (two) times daily.  Historical Provider, MD  amitriptyline (ELAVIL) 100 MG tablet Take 100 mg by mouth at bedtime.    Historical Provider, MD  amLODipine (NORVASC) 5 MG tablet Take 1 tablet (5 mg total) by mouth daily. For high blood pressure control 07/21/13   Alycia Rossetti, MD  chlorpheniramine (CHLOR-TRIMETON) 4 MG tablet Take 4 mg by mouth 3 (three) times daily.    Historical Provider, MD  dexamethasone (DECADRON) 4 MG tablet Take 1 tablet (4 mg total) by mouth 2 (two) times daily with  a meal. 06/09/16   Lily Kocher, PA-C  fish oil-omega-3 fatty acids 1000 MG capsule Take 1 capsule (1 g total) by mouth daily. For cholesterol control 03/19/13   Encarnacion Slates, NP  gabapentin (NEURONTIN) 300 MG capsule Take 1 capsule (300 mg total) by mouth 3 (three) times daily. 07/30/16   Carole Civil, MD  gemfibrozil (LOPID) 600 MG tablet Take 1 tablet (600 mg total) by mouth 2 (two) times daily before a meal. Cholesterol control 07/21/13   Alycia Rossetti, MD  ibuprofen (ADVIL,MOTRIN) 600 MG tablet Take 1 tablet (600 mg total) by mouth 4 (four) times daily. Take this medication with food 06/09/16   Lily Kocher, PA-C  lactulose (CHRONULAC) 10 GM/15ML solution Take 20 g by mouth at bedtime.    Historical Provider, MD  lisinopril (PRINIVIL,ZESTRIL) 5 MG tablet Take 5 mg by mouth daily.    Historical Provider, MD  metFORMIN (GLUCOPHAGE) 1000 MG tablet Take 1,000 mg by mouth daily with breakfast.    Historical Provider, MD  Multiple Vitamin (MULTIVITAMIN WITH MINERALS) TABS Take 1 tablet by mouth daily.    Historical Provider, MD  omeprazole (PRILOSEC) 20 MG capsule Take 1 capsule (20 mg total) by mouth daily. For acid reflux 07/21/13   Alycia Rossetti, MD  oxyCODONE-acetaminophen (PERCOCET/ROXICET) 5-325 MG per tablet Take 1 tablet by mouth every 6 (six) hours as needed for pain. Patient not taking: Reported on 07/30/2016 08/27/13   Alycia Rossetti, MD  Pancrelipase, Lip-Prot-Amyl, (CREON) 36000 UNITS CPEP Take 72,000 Units by mouth 3 (three) times daily with meals. Take 2 capsules with meals and one with snacks. 08/04/13   Mahala Menghini, PA-C  tamsulosin (FLOMAX) 0.4 MG CAPS Take 1 capsule (0.4 mg total) by mouth daily. For enlarged prostate 07/21/13   Alycia Rossetti, MD    Family History Family History  Problem Relation Age of Onset  . Diabetes Father   . Anesthesia problems Neg Hx   . Hypotension Neg Hx   . Malignant hyperthermia Neg Hx   . Pseudochol deficiency Neg Hx   . Colon cancer  Neg Hx     Social History Social History  Substance Use Topics  . Smoking status: Current Every Day Smoker    Packs/day: 0.50    Years: 25.00    Types: Cigarettes  . Smokeless tobacco: Former Systems developer    Types: Chew  . Alcohol use 0.0 oz/week     Comment: 6-8 beers a day     Allergies   Penicillins   Review of Systems Review of Systems  Musculoskeletal: Positive for arthralgias and joint swelling.  Skin: Negative for color change and wound.  All other systems reviewed and are negative.  Physical Exam Updated Vital Signs BP 140/81 (BP Location: Left Arm)   Pulse 88   Temp 98.5 F (36.9 C) (Oral)   Resp 16   Ht 5\' 10"  (1.778 m)   Wt 204 lb (92.5 kg)   SpO2  98%   BMI 29.27 kg/m   Physical Exam  Constitutional: He is oriented to person, place, and time. He appears well-developed and well-nourished.  HENT:  Head: Normocephalic and atraumatic.  Eyes: EOM are normal.  Neck: Normal range of motion.  Cardiovascular: Normal rate.   Pulmonary/Chest: Effort normal. No respiratory distress.  Abdominal: Soft.  Musculoskeletal: Normal range of motion.  Neurological: He is alert and oriented to person, place, and time.  Skin: Skin is warm and dry.  Psychiatric: He has a normal mood and affect.  Nursing note and vitals reviewed.  ED Treatments / Results  Labs (all labs ordered are listed, but only abnormal results are displayed) Labs Reviewed  CBG MONITORING, ED - Abnormal; Notable for the following:       Result Value   Glucose-Capillary 118 (*)    All other components within normal limits    EKG  EKG Interpretation None       Radiology No results found.  Procedures Procedures  DIAGNOSTIC STUDIES: Oxygen Saturation is 98% on RA, normal by my interpretation.    COORDINATION OF CARE: 11:18 AM Discussed next steps with pt. Pt verbalized understanding and is agreeable with the plan.    Medications Ordered in ED Medications  methylPREDNISolone sodium  succinate (SOLU-MEDROL) 125 mg/2 mL injection 125 mg (not administered)  ketorolac (TORADOL) injection 60 mg (not administered)     Initial Impression / Assessment and Plan / ED Course  I have reviewed the triage vital signs and the nursing notes.  Pertinent labs & imaging results that were available during my care of the patient were reviewed by me and considered in my medical decision making (see chart for details).  Clinical Course    Pt advised to follow up with his scheduled appointment at [facility] with Dr. Ninfa Linden. Patient given solu-medrol and Toradol injections for short term pain while in ED, conservative therapy recommended and discussed. Patient will be discharged home & is agreeable with above plan. Returns precautions discussed. Pt appears safe for discharge.   Final Clinical Impressions(s) / ED Diagnoses   Final diagnoses:  Left hip pain    New Prescriptions Discharge Medication List as of 09/02/2016 11:24 AM     No outpatient prescriptions have been marked as taking for the 09/02/16 encounter [facility] Encounter).     Hollace Kinnier [place], PA-C 09/02/16 [place], MD 09/02/16 1535

## 2016-09-23 ENCOUNTER — Other Ambulatory Visit: Payer: Self-pay | Admitting: Physician Assistant

## 2016-09-23 NOTE — Progress Notes (Signed)
Pt is being scheduled for preop appt; please place surgical orders in epic. Thanks.  

## 2016-09-29 ENCOUNTER — Encounter (HOSPITAL_COMMUNITY): Payer: Self-pay | Admitting: Emergency Medicine

## 2016-09-29 ENCOUNTER — Emergency Department (HOSPITAL_COMMUNITY)
Admission: EM | Admit: 2016-09-29 | Discharge: 2016-09-29 | Disposition: A | Discharge status: Home / Self Care | Payer: Medicare Other | Attending: Emergency Medicine | Admitting: Emergency Medicine

## 2016-09-29 DIAGNOSIS — R0981 Nasal congestion: Secondary | ICD-10-CM | POA: Insufficient documentation

## 2016-09-29 DIAGNOSIS — M25552 Pain in left hip: Secondary | ICD-10-CM

## 2016-09-29 DIAGNOSIS — I10 Essential (primary) hypertension: Secondary | ICD-10-CM | POA: Diagnosis not present

## 2016-09-29 DIAGNOSIS — F17210 Nicotine dependence, cigarettes, uncomplicated: Secondary | ICD-10-CM | POA: Diagnosis not present

## 2016-09-29 DIAGNOSIS — G8929 Other chronic pain: Secondary | ICD-10-CM | POA: Diagnosis not present

## 2016-09-29 DIAGNOSIS — Z79899 Other long term (current) drug therapy: Secondary | ICD-10-CM | POA: Diagnosis not present

## 2016-09-29 DIAGNOSIS — Z7984 Long term (current) use of oral hypoglycemic drugs: Secondary | ICD-10-CM | POA: Diagnosis not present

## 2016-09-29 DIAGNOSIS — E119 Type 2 diabetes mellitus without complications: Secondary | ICD-10-CM | POA: Diagnosis not present

## 2016-09-29 DIAGNOSIS — R197 Diarrhea, unspecified: Secondary | ICD-10-CM | POA: Insufficient documentation

## 2016-09-29 MED ORDER — DIPHENOXYLATE-ATROPINE 2.5-0.025 MG PO TABS
2.0000 | ORAL_TABLET | Freq: Once | ORAL | Status: AC
  Administered 2016-09-29: 2 via ORAL
  Filled 2016-09-29: qty 2

## 2016-09-29 MED ORDER — TRAMADOL HCL 50 MG PO TABS: 50.0000 mg | ORAL_TABLET | Freq: Four times a day (QID) | ORAL | 0 refills | Status: DC | PRN

## 2016-09-29 MED ORDER — NAPROXEN 500 MG PO TABS: 500.0000 mg | ORAL_TABLET | Freq: Two times a day (BID) | ORAL | 0 refills | Status: DC

## 2016-09-29 MED ORDER — HYDROCODONE-ACETAMINOPHEN 5-325 MG PO TABS
1.0000 | ORAL_TABLET | Freq: Once | ORAL | Status: AC
  Administered 2016-09-29: 1 via ORAL
  Filled 2016-09-29: qty 1

## 2016-09-29 NOTE — ED Provider Notes (Signed)
[facility] DEPT Provider Note   CSN: [id number] Arrival date & time: 09/29/16  1726     History   Chief Complaint Chief Complaint  Patient presents with  . Hip Pain    HPI [name] [name]. is a 55 y.o. male. He presents with complaint of hip pain. His chronic hip pain. He has known femoral head labs secondary to avascular necrosis. Is followed by Dr. Aline Brochure. Has been referred to Dr. Ninfa Linden for anterior approach total hip. States he's been "walking a lot" his hip is more painful today and presents for evaluation. Also states he has a runny nose. States he had some diarrhea today as well.  States he normally gets hydrocodone from his primary care physician.  States "last time I was here they gave me a shot"  HPI  Past Medical History:  Diagnosis Date  . Acid reflux   . Alcohol abuse    6-8 cans of beer daily  . Arthritis   . Bipolar disorder ([place])   . Bronchitis   . Chronic back pain   . Chronic neck pain   . Chronic pain   . Fracture of lower leg   . Gout   . Hyperlipidemia   . Hypertension   . Left arm pain    chronic  . Pancreatitis    March 2013  . Pseudocyst of pancreas 02/28/2012    Patient Active Problem List   Diagnosis Date Noted  . Rash and nonspecific skin eruption 08/12/2013  . Cholelithiases 08/04/2013  . Subcutaneous nodule 07/21/2013  . Acute alcoholic pancreatitis [id number]  . Syncope 04/11/2013  . Prolonged Q-T interval on ECG 04/11/2013  . Cocaine abuse 03/13/2013    Class: Chronic  . Polysubstance abuse 02/14/2013  . DDD (degenerative disc disease), lumbar 02/01/2013  . Dyspepsia 12/01/2012  . BPH (benign prostatic hyperplasia) 10/07/2012  . Allergic rhinitis 10/03/2012  . Transaminitis 10/02/2012  . Chronic back pain 10/02/2012  . Essential hypertension, benign 07/01/2012  . Tobacco user 07/01/2012  . Hepatic steatosis 03/06/2012  . ED (erectile dysfunction) 03/06/2012  . Pseudocyst of pancreas 02/28/2012  . Alcohol abuse  02/25/2012  . Allergic rhinitis 02/23/2012  . GERD (gastroesophageal reflux disease) 02/23/2012  . Bipolar 1 disorder ([place]) 02/23/2012  . Hypertriglyceridemia 12/05/2011    Past Surgical History:  Procedure Laterality Date  . CIRCUMCISION  03/17/2012   Procedure: CIRCUMCISION ADULT;  Surgeon: Marissa Nestle, MD;  Location: [facility];  Service: Urology;  Laterality: N/A;  . COLONOSCOPY WITH PROPOFOL  12/24/2012   [id number] lesion as described above status post biopsy; otherwise normal rectum/Sigmoid diverticulosis/Sigmoid polyps -- resected as described above. anal lesion, benign. colon polyp, hyperplastic  . ESOPHAGOGASTRODUODENOSCOPY (EGD) WITH PROPOFOL  12/24/2012   [id number] erythema and erosions of uncertain significance status post gastric biopsy (reactive gastropathy NO h.pylori)  . NOSE SURGERY     broken nose       Home Medications    Prior to Admission medications   Medication Sig Start Date End Date Taking? Authorizing Provider  amLODipine (NORVASC) 5 MG tablet Take 1 tablet (5 mg total) by mouth daily. For high blood pressure control 07/21/13  Yes Alycia Rossetti, MD  Multiple Vitamin (MULTIVITAMIN WITH MINERALS) TABS Take 1 tablet by mouth daily.   Yes Historical Provider, MD  acetaminophen (TYLENOL) 650 MG CR tablet Take 650 mg by mouth 2 (two) times daily.    Historical Provider, MD  gabapentin (NEURONTIN) 300 MG capsule Take 1 capsule (300 mg total) by  mouth 3 (three) times daily. Patient not taking: Reported on 09/29/2016 07/30/16   Carole Civil, MD  gemfibrozil (LOPID) 600 MG tablet Take 1 tablet (600 mg total) by mouth 2 (two) times daily before a meal. Cholesterol control 07/21/13   Alycia Rossetti, MD  lisinopril (PRINIVIL,ZESTRIL) 5 MG tablet Take 5 mg by mouth daily.    Historical Provider, MD  metFORMIN (GLUCOPHAGE) 1000 MG tablet Take 1,000 mg by mouth daily with breakfast.    Historical Provider, MD  methocarbamol (ROBAXIN) 500 MG tablet Take 1 tablet (500 mg  total) by mouth 2 (two) times daily. 09/02/16   Fransico Meadow, PA-C  naproxen (NAPROSYN) 500 MG tablet Take 1 tablet (500 mg total) by mouth 2 (two) times daily. 09/29/16   Tanna Furry, MD  omeprazole (PRILOSEC) 20 MG capsule Take 1 capsule (20 mg total) by mouth daily. For acid reflux 07/21/13   Alycia Rossetti, MD  oxyCODONE (OXY IR/ROXICODONE) 5 MG immediate release tablet Take 5 mg by mouth See admin instructions. 1 tablet 3 times daily, 2 tablets at bedtime    Historical Provider, MD  tamsulosin (FLOMAX) 0.4 MG CAPS Take 1 capsule (0.4 mg total) by mouth daily. For enlarged prostate 07/21/13   Alycia Rossetti, MD  traMADol (ULTRAM) 50 MG tablet Take 1 tablet (50 mg total) by mouth every 6 (six) hours as needed. 09/29/16   Tanna Furry, MD    Family History Family History  Problem Relation Age of Onset  . Diabetes Father   . Anesthesia problems Neg Hx   . Hypotension Neg Hx   . Malignant hyperthermia Neg Hx   . Pseudochol deficiency Neg Hx   . Colon cancer Neg Hx     Social History Social History  Substance Use Topics  . Smoking status: Current Every Day Smoker    Packs/day: 0.50    Years: 25.00    Types: Cigarettes  . Smokeless tobacco: Former Systems developer    Types: Chew  . Alcohol use 0.0 oz/week     Comment: 6-8 beers a day     Allergies   Penicillins   Review of Systems Review of Systems  Constitutional: Negative for appetite change, chills, diaphoresis, fatigue and fever.  HENT: Positive for congestion and rhinorrhea. Negative for mouth sores, sore throat and trouble swallowing.   Eyes: Negative for visual disturbance.  Respiratory: Negative for cough, chest tightness, shortness of breath and wheezing.   Cardiovascular: Negative for chest pain.  Gastrointestinal: Positive for diarrhea. Negative for abdominal distention, abdominal pain, nausea and vomiting.  Endocrine: Negative for polydipsia, polyphagia and polyuria.  Genitourinary: Negative for dysuria, frequency and  hematuria.  Musculoskeletal: Positive for arthralgias and gait problem.  Skin: Negative for color change, pallor and rash.  Neurological: Negative for dizziness, syncope, light-headedness and headaches.  Hematological: Does not bruise/bleed easily.  Psychiatric/Behavioral: Negative for behavioral problems and confusion.     Physical Exam Updated Vital Signs BP 133/81 (BP Location: Left Arm)   Pulse 92   Temp 99.1 F (37.3 C) (Oral)   Resp 18   Ht 5\' 10"  (1.778 m)   Wt 204 lb (92.5 kg)   SpO2 100%   BMI 29.27 kg/m   Physical Exam  Constitutional: He is oriented to person, place, and time. He appears well-developed and well-nourished. No distress.  HENT:  Head: Normocephalic.  Normal nares. No congestion or erythema.  Eyes: Conjunctivae are normal. Pupils are equal, round, and reactive to light. No scleral icterus.  Neck: Normal range of motion. Neck supple. No thyromegaly present.  Cardiovascular: Normal rate and regular rhythm.  Exam reveals no gallop and no friction rub.   No murmur heard. Pulmonary/Chest: Effort normal and breath sounds normal. No respiratory distress. He has no wheezes. He has no rales.  Abdominal: Soft. Bowel sounds are normal. He exhibits no distension. There is no tenderness. There is no rebound.  Musculoskeletal: Normal range of motion.  Planes of pain and tenderness and around the left greater trochanter. Patient's is her gait is markedly Trendelenburg with the left leg consistent with a history of avascular necrosis of femoral head collapse.  Neurological: He is alert and oriented to person, place, and time.  Skin: Skin is warm and dry. No rash noted.  Psychiatric: He has a normal mood and affect. His behavior is normal.     ED Treatments / Results  Labs (all labs ordered are listed, but only abnormal results are displayed) Labs Reviewed - No data to display  EKG  EKG Interpretation None       Radiology No results  found.  Procedures Procedures (including critical care time)  Medications Ordered in ED Medications  HYDROcodone-acetaminophen (NORCO/VICODIN) 5-325 MG per tablet 1 tablet (not administered)     Initial Impression / Assessment and Plan / ED Course  I have reviewed the triage vital signs and the nursing notes.  Pertinent labs & imaging results that were available during my care of the patient were reviewed by me and considered in my medical decision making (see chart for details).  Clinical Course    Declined parenteral medications at this time. Declined prescription. I referred him back to his primary care. Offered anti-inflammatories. Offered him an antihistamine and Lomotil for his diarrhea and runny nose.  Final Clinical Impressions(s) / ED Diagnoses   Final diagnoses:  Chronic left hip pain  Left hip pain    New Prescriptions New Prescriptions   NAPROXEN (NAPROSYN) 500 MG TABLET    Take 1 tablet (500 mg total) by mouth 2 (two) times daily.   TRAMADOL (ULTRAM) 50 MG TABLET    Take 1 tablet (50 mg total) by mouth every 6 (six) hours as needed.     Tanna Furry, MD 09/29/16 [phone]

## 2016-09-29 NOTE — Discharge Instructions (Signed)
We do not prescribe controlled substances such as hydrocodone for chronic pain in the emergency room.  Follow-up with your PA at your primary care physician's office for additional pain medication prescriptions

## 2016-09-29 NOTE — ED Triage Notes (Signed)
Patient c/o left hip pain that radiates into left leg and foot. Per patient intermittent sharp shooting pains. Denies any injuries. Patient states that the only thing he has done differently before pain started was increase his amount of time he walks daily.

## 2016-09-29 NOTE — ED Triage Notes (Signed)
Patient also c/o diarrhea that started today and a runny nose that has "been going on for a while."

## 2016-10-01 NOTE — Patient Instructions (Addendum)
[name] [name].  10/01/2016   Your procedure is scheduled on: 10/04/16 Report to [facility] Main  Entrance take [facility]  elevators to 3rd floor to  [place] at 10:00  AM.  Call this number if you have problems the morning of surgery [phone]   Remember: ONLY 1 PERSON MAY GO WITH YOU TO SHORT STAY TO GET  READY MORNING OF [place].  Do not eat food or drink liquids :After Midnight.     Take these medicines the morning of surgery with A SIP OF WATER: Amlodipine, Methocarbamol, Omeprazole, Tamulosin May take Oxycodone or Tramadol if needed DO NOT TAKE ANY DIABETIC MEDICATIONS DAY OF YOUR SURGERY                               You may not have any metal on your body including hair pins and              piercings  Do not wear jewelry, make-up, lotions, powders or perfumes, deodorant             Do not wear nail polish.  Do not shave  48 hours prior to surgery.              Men may shave face and neck.   Do not bring valuables to the hospital. [place].  Contacts, dentures or bridgework may not be worn into surgery.  Leave suitcase in the car. After surgery it may be brought to your room.              [facility] - Preparing for Surgery Before surgery, you can play an important role.  Because skin is not sterile, your skin needs to be as free of germs as possible.  You can reduce the number of germs on your skin by washing with CHG (chlorahexidine gluconate) soap before surgery.  CHG is an antiseptic cleaner which kills germs and bonds with the skin to continue killing germs even after washing. Please DO NOT use if you have an allergy to CHG or antibacterial soaps.  If your skin becomes reddened/irritated stop using the CHG and inform your nurse when you arrive at Short Stay. Do not shave (including legs and underarms) for at least 48 hours prior to the first CHG shower.  You may shave your  face/neck. Please follow these instructions carefully:  1.  Shower with CHG Soap the night before surgery and the  morning of Surgery.  2.  If you choose to wash your hair, wash your hair first as usual with your  normal  shampoo.  3.  After you shampoo, rinse your hair and body thoroughly to remove the  shampoo.                           4.  Use CHG as you would any other liquid soap.  You can apply chg directly  to the skin and wash                       Gently with a scrungie or clean washcloth.  5.  Apply the CHG Soap to your body ONLY FROM THE NECK DOWN.  Do not use on face/ open                           Wound or open sores. Avoid contact with eyes, ears mouth and genitals (private parts).                       Wash face,  Genitals (private parts) with your normal soap.             6.  Wash thoroughly, paying special attention to the area where your surgery  will be performed.  7.  Thoroughly rinse your body with warm water from the neck down.  8.  DO NOT shower/wash with your normal soap after using and rinsing off  the CHG Soap.                9.  Pat yourself dry with a clean towel.            10.  Wear clean pajamas.            11.  Place clean sheets on your bed the night of your first shower and do not  sleep with pets. Day of Surgery : Do not apply any lotions/deodorants the morning of surgery.  Please wear clean clothes to the hospital/surgery center.  FAILURE TO FOLLOW THESE INSTRUCTIONS MAY RESULT IN THE CANCELLATION OF YOUR SURGERY PATIENT SIGNATURE_________________________________  NURSE SIGNATURE__________________________________  ________________________________________________________________________

## 2016-10-01 NOTE — Progress Notes (Signed)
Cbc , cmp, hgba1c 09/09/16 with LOV Dr Maudie Mercury all on chart

## 2016-10-02 ENCOUNTER — Encounter (HOSPITAL_COMMUNITY): Payer: Self-pay

## 2016-10-02 ENCOUNTER — Encounter (HOSPITAL_COMMUNITY)
Admission: RE | Admit: 2016-10-02 | Discharge: 2016-10-02 | Disposition: A | Discharge status: Home / Self Care | Payer: Medicare Other | Source: Ambulatory Visit | Attending: Orthopaedic Surgery | Admitting: Orthopaedic Surgery

## 2016-10-02 HISTORY — DX: Type 2 diabetes mellitus without complications: E11.9

## 2016-10-02 LAB — SURGICAL PCR SCREEN
MRSA, PCR: NEGATIVE
Staphylococcus aureus: NEGATIVE

## 2016-10-02 LAB — ABO/RH: ABO/RH(D): A POS

## 2016-10-02 NOTE — Progress Notes (Signed)
Addendum to note 10/10/ [facility] ov notes and labs on chart.. Discussed usage of cocaine/marijuana with him.  Made him aware that he will not have good outcome from his surgery if he does cocaine.  Verbalized understanding

## 2016-10-04 ENCOUNTER — Inpatient Hospital Stay (HOSPITAL_COMMUNITY)
Admission: RE | Admit: 2016-10-04 | Discharge: 2016-10-08 | DRG: 470 | Disposition: A | Discharge status: Skilled Nursing Facility | Payer: Medicare Other | Source: Ambulatory Visit | Attending: Orthopaedic Surgery | Admitting: Orthopaedic Surgery

## 2016-10-04 ENCOUNTER — Inpatient Hospital Stay (HOSPITAL_COMMUNITY): Payer: Medicare Other | Admitting: Anesthesiology

## 2016-10-04 ENCOUNTER — Inpatient Hospital Stay (HOSPITAL_COMMUNITY): Payer: Medicare Other

## 2016-10-04 ENCOUNTER — Encounter (HOSPITAL_COMMUNITY): Payer: Self-pay | Admitting: Certified Registered"

## 2016-10-04 ENCOUNTER — Encounter (HOSPITAL_COMMUNITY)
Admission: RE | Disposition: A | Discharge status: Skilled Nursing Facility | Payer: Self-pay | Source: Ambulatory Visit | Attending: Orthopaedic Surgery

## 2016-10-04 DIAGNOSIS — I10 Essential (primary) hypertension: Secondary | ICD-10-CM | POA: Diagnosis not present

## 2016-10-04 DIAGNOSIS — R262 Difficulty in walking, not elsewhere classified: Secondary | ICD-10-CM

## 2016-10-04 DIAGNOSIS — F1010 Alcohol abuse, uncomplicated: Secondary | ICD-10-CM | POA: Diagnosis present

## 2016-10-04 DIAGNOSIS — E119 Type 2 diabetes mellitus without complications: Secondary | ICD-10-CM | POA: Diagnosis not present

## 2016-10-04 DIAGNOSIS — M87352 Other secondary osteonecrosis, left femur: Secondary | ICD-10-CM | POA: Diagnosis not present

## 2016-10-04 DIAGNOSIS — M1612 Unilateral primary osteoarthritis, left hip: Secondary | ICD-10-CM | POA: Diagnosis not present

## 2016-10-04 DIAGNOSIS — Z96642 Presence of left artificial hip joint: Secondary | ICD-10-CM

## 2016-10-04 DIAGNOSIS — E785 Hyperlipidemia, unspecified: Secondary | ICD-10-CM | POA: Diagnosis not present

## 2016-10-04 DIAGNOSIS — M25552 Pain in left hip: Secondary | ICD-10-CM | POA: Diagnosis present

## 2016-10-04 DIAGNOSIS — K219 Gastro-esophageal reflux disease without esophagitis: Secondary | ICD-10-CM | POA: Diagnosis not present

## 2016-10-04 DIAGNOSIS — F17210 Nicotine dependence, cigarettes, uncomplicated: Secondary | ICD-10-CM | POA: Diagnosis not present

## 2016-10-04 DIAGNOSIS — G8929 Other chronic pain: Secondary | ICD-10-CM | POA: Diagnosis present

## 2016-10-04 DIAGNOSIS — M25562 Pain in left knee: Secondary | ICD-10-CM | POA: Diagnosis not present

## 2016-10-04 DIAGNOSIS — M87052 Idiopathic aseptic necrosis of left femur: Secondary | ICD-10-CM

## 2016-10-04 HISTORY — PX: TOTAL HIP ARTHROPLASTY: SHX124

## 2016-10-04 LAB — BASIC METABOLIC PANEL
Anion gap: 7 (ref 5–15)
BUN: 13 mg/dL (ref 6–20)
CO2: 27 mmol/L (ref 22–32)
Calcium: 9.1 mg/dL (ref 8.9–10.3)
Chloride: 104 mmol/L (ref 101–111)
Creatinine, Ser: 0.91 mg/dL (ref 0.61–1.24)
GFR calc Af Amer: >60 mL/min (ref >60)
GFR calc non Af Amer: >60 mL/min (ref >60)
Glucose, Bld: 92 mg/dL (ref 65–99)
Potassium: 3.7 mmol/L (ref 3.5–5.1)
Sodium: 138 mmol/L (ref 135–145)

## 2016-10-04 LAB — CBC
HCT: 40.4 % (ref 39.0–52.0)
Hemoglobin: 13.5 g/dL (ref 13.0–17.0)
MCH: 30.1 pg (ref 26.0–34.0)
MCHC: 33.4 g/dL (ref 30.0–36.0)
MCV: 90.2 fL (ref 78.0–100.0)
Platelets: 326 10*3/uL (ref 150–400)
RBC: 4.48 MIL/uL (ref 4.22–5.81)
RDW: 14.9 % (ref 11.5–15.5)
WBC: 5.3 10*3/uL (ref 4.0–10.5)

## 2016-10-04 LAB — GLUCOSE, CAPILLARY
Glucose-Capillary: 100 mg/dL — ABNORMAL HIGH (ref 65–99)
Glucose-Capillary: 104 mg/dL — ABNORMAL HIGH (ref 65–99)
Glucose-Capillary: 141 mg/dL — ABNORMAL HIGH (ref 65–99)

## 2016-10-04 LAB — TYPE AND SCREEN
ABO/RH(D): A POS
Antibody Screen: NEGATIVE

## 2016-10-04 LAB — RAPID URINE DRUG SCREEN, HOSP PERFORMED
Amphetamines: NOT DETECTED
Barbiturates: NOT DETECTED
Benzodiazepines: NOT DETECTED
Cocaine: NOT DETECTED
Opiates: NOT DETECTED
Tetrahydrocannabinol: NOT DETECTED

## 2016-10-04 SURGERY — ARTHROPLASTY, HIP, TOTAL, ANTERIOR APPROACH
Anesthesia: Spinal | Site: Hip | Laterality: Left

## 2016-10-04 MED ORDER — ACETAMINOPHEN 325 MG PO TABS
650.0000 mg | ORAL_TABLET | Freq: Four times a day (QID) | ORAL | Status: DC | PRN
  Administered 2016-10-06 – 2016-10-08 (×6): 650 mg via ORAL
  Filled 2016-10-04 (×6): qty 2

## 2016-10-04 MED ORDER — PROPOFOL 500 MG/50ML IV EMUL
INTRAVENOUS | Status: DC | PRN
  Administered 2016-10-04: 100 ug/kg/min via INTRAVENOUS

## 2016-10-04 MED ORDER — DIPHENHYDRAMINE HCL 12.5 MG/5ML PO ELIX
12.5000 mg | ORAL_SOLUTION | ORAL | Status: DC | PRN
  Administered 2016-10-06 – 2016-10-07 (×2): 12.5 mg via ORAL
  Filled 2016-10-04 (×2): qty 5

## 2016-10-04 MED ORDER — PHENOL 1.4 % MT LIQD: 1.0000 | OROMUCOSAL | Status: DC | PRN

## 2016-10-04 MED ORDER — KETOROLAC TROMETHAMINE 15 MG/ML IJ SOLN
7.5000 mg | Freq: Four times a day (QID) | INTRAMUSCULAR | Status: AC
  Administered 2016-10-04 – 2016-10-05 (×4): 7.5 mg via INTRAVENOUS
  Filled 2016-10-04 (×4): qty 1

## 2016-10-04 MED ORDER — LACTATED RINGERS IV SOLN
INTRAVENOUS | Status: DC | PRN
Start: 2016-10-04 — End: 2016-10-04
  Administered 2016-10-04 (×3): via INTRAVENOUS

## 2016-10-04 MED ORDER — ACETAMINOPHEN 650 MG RE SUPP: 650.0000 mg | Freq: Four times a day (QID) | RECTAL | Status: DC | PRN

## 2016-10-04 MED ORDER — CHLORHEXIDINE GLUCONATE 4 % EX LIQD: 60.0000 mL | Freq: Once | CUTANEOUS | Status: DC

## 2016-10-04 MED ORDER — STERILE WATER FOR IRRIGATION IR SOLN
Status: DC | PRN
  Administered 2016-10-04: 2000 mL

## 2016-10-04 MED ORDER — AMLODIPINE BESYLATE 5 MG PO TABS: 5.0000 mg | ORAL_TABLET | Freq: Every day | ORAL | Status: DC

## 2016-10-04 MED ORDER — METOCLOPRAMIDE HCL 5 MG PO TABS
5.0000 mg | ORAL_TABLET | Freq: Three times a day (TID) | ORAL | Status: DC | PRN
  Administered 2016-10-07: 10 mg via ORAL
  Filled 2016-10-04: qty 2

## 2016-10-04 MED ORDER — ONDANSETRON HCL 4 MG PO TABS
4.0000 mg | ORAL_TABLET | Freq: Four times a day (QID) | ORAL | Status: DC | PRN
  Administered 2016-10-07 – 2016-10-08 (×2): 4 mg via ORAL
  Filled 2016-10-04 (×2): qty 1

## 2016-10-04 MED ORDER — INFLUENZA VAC SPLIT QUAD 0.5 ML IM SUSY: 0.5000 mL | PREFILLED_SYRINGE | INTRAMUSCULAR | Status: DC

## 2016-10-04 MED ORDER — PROPOFOL 10 MG/ML IV BOLUS
INTRAVENOUS | Status: AC
  Filled 2016-10-04: qty 20

## 2016-10-04 MED ORDER — SODIUM CHLORIDE 0.9 % IV SOLN
INTRAVENOUS | Status: DC
  Administered 2016-10-04 – 2016-10-05 (×2): via INTRAVENOUS

## 2016-10-04 MED ORDER — FENTANYL CITRATE (PF) 100 MCG/2ML IJ SOLN
INTRAMUSCULAR | Status: DC | PRN
  Administered 2016-10-04: 100 ug via INTRAVENOUS

## 2016-10-04 MED ORDER — MIDAZOLAM HCL 5 MG/5ML IJ SOLN
INTRAMUSCULAR | Status: DC | PRN
  Administered 2016-10-04: 2 mg via INTRAVENOUS

## 2016-10-04 MED ORDER — TAMSULOSIN HCL 0.4 MG PO CAPS: 0.4000 mg | ORAL_CAPSULE | Freq: Every day | ORAL | Status: DC

## 2016-10-04 MED ORDER — TRANEXAMIC ACID 1000 MG/10ML IV SOLN
1000.0000 mg | INTRAVENOUS | Status: AC
  Administered 2016-10-04: 1000 mg via INTRAVENOUS
  Filled 2016-10-04: qty 1100

## 2016-10-04 MED ORDER — CLINDAMYCIN PHOSPHATE 900 MG/50ML IV SOLN
900.0000 mg | INTRAVENOUS | Status: AC
  Administered 2016-10-04: 900 mg via INTRAVENOUS

## 2016-10-04 MED ORDER — FENTANYL CITRATE (PF) 100 MCG/2ML IJ SOLN
INTRAMUSCULAR | Status: AC
  Filled 2016-10-04: qty 2

## 2016-10-04 MED ORDER — EPHEDRINE SULFATE-NACL 50-0.9 MG/10ML-% IV SOSY
PREFILLED_SYRINGE | INTRAVENOUS | Status: DC | PRN
  Administered 2016-10-04 (×4): 10 mg via INTRAVENOUS
  Administered 2016-10-04: 5 mg via INTRAVENOUS

## 2016-10-04 MED ORDER — METHOCARBAMOL 1000 MG/10ML IJ SOLN
500.0000 mg | Freq: Four times a day (QID) | INTRAMUSCULAR | Status: DC | PRN
  Administered 2016-10-04: 500 mg via INTRAVENOUS
  Filled 2016-10-04: qty 550
  Filled 2016-10-04: qty 5

## 2016-10-04 MED ORDER — LIDOCAINE 2% (20 MG/ML) 5 ML SYRINGE
INTRAMUSCULAR | Status: DC | PRN
  Administered 2016-10-04: 100 mg via INTRAVENOUS

## 2016-10-04 MED ORDER — HYDROMORPHONE HCL 1 MG/ML IJ SOLN
1.0000 mg | INTRAMUSCULAR | Status: DC | PRN
  Administered 2016-10-04 – 2016-10-06 (×6): 1 mg via INTRAVENOUS
  Filled 2016-10-04 (×6): qty 1

## 2016-10-04 MED ORDER — ALUM & MAG HYDROXIDE-SIMETH 200-200-20 MG/5ML PO SUSP
30.0000 mL | ORAL | Status: DC | PRN
  Administered 2016-10-07: 30 mL via ORAL
  Filled 2016-10-04: qty 30

## 2016-10-04 MED ORDER — ASPIRIN EC 325 MG PO TBEC
325.0000 mg | DELAYED_RELEASE_TABLET | Freq: Two times a day (BID) | ORAL | Status: DC
  Administered 2016-10-05 – 2016-10-08 (×7): 325 mg via ORAL
  Filled 2016-10-04 (×7): qty 1

## 2016-10-04 MED ORDER — METFORMIN HCL 500 MG PO TABS
1000.0000 mg | ORAL_TABLET | Freq: Every day | ORAL | Status: DC
  Administered 2016-10-05 – 2016-10-08 (×4): 1000 mg via ORAL
  Filled 2016-10-04 (×4): qty 2

## 2016-10-04 MED ORDER — DOCUSATE SODIUM 100 MG PO CAPS
100.0000 mg | ORAL_CAPSULE | Freq: Two times a day (BID) | ORAL | Status: DC
  Administered 2016-10-04 – 2016-10-08 (×8): 100 mg via ORAL
  Filled 2016-10-04 (×8): qty 1

## 2016-10-04 MED ORDER — BUPIVACAINE IN DEXTROSE 0.75-8.25 % IT SOLN
INTRATHECAL | Status: DC | PRN
  Administered 2016-10-04: 2 mL via INTRATHECAL

## 2016-10-04 MED ORDER — PNEUMOCOCCAL VAC POLYVALENT 25 MCG/0.5ML IJ INJ
0.5000 mL | INJECTION | INTRAMUSCULAR | Status: DC
  Filled 2016-10-04 (×2): qty 0.5

## 2016-10-04 MED ORDER — OXYCODONE HCL 5 MG PO TABS
5.0000 mg | ORAL_TABLET | ORAL | Status: DC | PRN
  Administered 2016-10-04: 15 mg via ORAL
  Administered 2016-10-04: 10 mg via ORAL
  Administered 2016-10-04 – 2016-10-08 (×22): 15 mg via ORAL
  Filled 2016-10-04 (×11): qty 3
  Filled 2016-10-04: qty 2
  Filled 2016-10-04 (×14): qty 3

## 2016-10-04 MED ORDER — ONDANSETRON HCL 4 MG/2ML IJ SOLN
INTRAMUSCULAR | Status: DC | PRN
  Administered 2016-10-04: 4 mg via INTRAVENOUS

## 2016-10-04 MED ORDER — PANTOPRAZOLE SODIUM 40 MG PO TBEC: 40.0000 mg | DELAYED_RELEASE_TABLET | Freq: Every day | ORAL | Status: DC

## 2016-10-04 MED ORDER — CLINDAMYCIN PHOSPHATE 900 MG/50ML IV SOLN
INTRAVENOUS | Status: AC
  Filled 2016-10-04: qty 50

## 2016-10-04 MED ORDER — MENTHOL 3 MG MT LOZG: 1.0000 | LOZENGE | OROMUCOSAL | Status: DC | PRN

## 2016-10-04 MED ORDER — SODIUM CHLORIDE 0.9 % IR SOLN
Status: DC | PRN
  Administered 2016-10-04: 1000 mL

## 2016-10-04 MED ORDER — ADULT MULTIVITAMIN W/MINERALS CH
1.0000 | ORAL_TABLET | Freq: Every day | ORAL | Status: DC
  Administered 2016-10-05 – 2016-10-08 (×4): 1 via ORAL
  Filled 2016-10-04 (×4): qty 1

## 2016-10-04 MED ORDER — HYDROMORPHONE HCL 1 MG/ML IJ SOLN: 0.2500 mg | INTRAMUSCULAR | Status: DC | PRN

## 2016-10-04 MED ORDER — 0.9 % SODIUM CHLORIDE (POUR BTL) OPTIME
TOPICAL | Status: DC | PRN
  Administered 2016-10-04: 1000 mL

## 2016-10-04 MED ORDER — ONDANSETRON HCL 4 MG/2ML IJ SOLN
INTRAMUSCULAR | Status: AC
  Filled 2016-10-04: qty 2

## 2016-10-04 MED ORDER — METHOCARBAMOL 500 MG PO TABS
500.0000 mg | ORAL_TABLET | Freq: Four times a day (QID) | ORAL | Status: DC | PRN
  Administered 2016-10-04 – 2016-10-06 (×5): 500 mg via ORAL
  Filled 2016-10-04 (×5): qty 1

## 2016-10-04 MED ORDER — ONDANSETRON HCL 4 MG/2ML IJ SOLN
4.0000 mg | Freq: Four times a day (QID) | INTRAMUSCULAR | Status: DC | PRN
  Administered 2016-10-06: 4 mg via INTRAVENOUS
  Filled 2016-10-04: qty 2

## 2016-10-04 MED ORDER — METOCLOPRAMIDE HCL 5 MG/ML IJ SOLN: 5.0000 mg | Freq: Three times a day (TID) | INTRAMUSCULAR | Status: DC | PRN

## 2016-10-04 MED ORDER — EPHEDRINE 5 MG/ML INJ
INTRAVENOUS | Status: AC
  Filled 2016-10-04: qty 10

## 2016-10-04 MED ORDER — GEMFIBROZIL 600 MG PO TABS
600.0000 mg | ORAL_TABLET | Freq: Two times a day (BID) | ORAL | Status: DC
  Administered 2016-10-05 – 2016-10-08 (×7): 600 mg via ORAL
  Filled 2016-10-04 (×8): qty 1

## 2016-10-04 MED ORDER — MIDAZOLAM HCL 2 MG/2ML IJ SOLN
INTRAMUSCULAR | Status: AC
Start: 2016-10-04 — End: 2016-10-04
  Filled 2016-10-04: qty 2

## 2016-10-04 MED ORDER — CLINDAMYCIN PHOSPHATE 600 MG/50ML IV SOLN
600.0000 mg | Freq: Four times a day (QID) | INTRAVENOUS | Status: AC
  Administered 2016-10-04 (×2): 600 mg via INTRAVENOUS
  Filled 2016-10-04 (×2): qty 50

## 2016-10-04 SURGICAL SUPPLY — 38 items
APL SKNCLS STERI-STRIP NONHPOA (GAUZE/BANDAGES/DRESSINGS) ×1
BAG SPEC THK2 15X12 ZIP CLS (MISCELLANEOUS)
BAG ZIPLOCK 12X15 (MISCELLANEOUS) IMPLANT
BENZOIN TINCTURE PRP APPL 2/3 (GAUZE/BANDAGES/DRESSINGS) ×2 IMPLANT
BLADE SAW SGTL 18X1.27X75 (BLADE) ×2 IMPLANT
BLADE SAW SGTL 18X1.27X75MM (BLADE) ×1
CAPT HIP TOTAL 2 ×2 IMPLANT
CELLS DAT CNTRL 66122 CELL SVR (MISCELLANEOUS) ×1 IMPLANT
CLOSURE WOUND 1/2 X4 (GAUZE/BANDAGES/DRESSINGS) ×1
CLOTH BEACON ORANGE TIMEOUT ST (SAFETY) ×3 IMPLANT
DRAPE STERI IOBAN 125X83 (DRAPES) ×3 IMPLANT
DRAPE U-SHAPE 47X51 STRL (DRAPES) ×6 IMPLANT
DRSG AQUACEL AG ADV 3.5X10 (GAUZE/BANDAGES/DRESSINGS) ×3 IMPLANT
DURAPREP 26ML APPLICATOR (WOUND CARE) ×3 IMPLANT
ELECT REM PT RETURN 9FT ADLT (ELECTROSURGICAL) ×3
ELECTRODE REM PT RTRN 9FT ADLT (ELECTROSURGICAL) ×1 IMPLANT
GAUZE XEROFORM 1X8 LF (GAUZE/BANDAGES/DRESSINGS) ×2 IMPLANT
GLOVE BIO SURGEON STRL SZ7.5 (GLOVE) ×5 IMPLANT
GLOVE BIOGEL PI IND STRL 8 (GLOVE) ×2 IMPLANT
GLOVE BIOGEL PI INDICATOR 8 (GLOVE) ×2
GLOVE ECLIPSE 8.0 STRL XLNG CF (GLOVE) ×1 IMPLANT
GOWN STRL REUS W/TWL XL LVL3 (GOWN DISPOSABLE) ×6 IMPLANT
HANDPIECE INTERPULSE COAX TIP (DISPOSABLE) ×3
HOLDER FOLEY CATH W/STRAP (MISCELLANEOUS) ×3 IMPLANT
PACK ANTERIOR HIP CUSTOM (KITS) ×3 IMPLANT
RETRACTOR WND ALEXIS 18 MED (MISCELLANEOUS) ×1 IMPLANT
RTRCTR WOUND ALEXIS 18CM MED (MISCELLANEOUS) ×3
SET HNDPC FAN SPRY TIP SCT (DISPOSABLE) ×1 IMPLANT
STAPLER VISISTAT 35W (STAPLE) IMPLANT
STRIP CLOSURE SKIN 1/2X4 (GAUZE/BANDAGES/DRESSINGS) ×1 IMPLANT
SUT ETHIBOND NAB CT1 #1 30IN (SUTURE) ×3 IMPLANT
SUT MNCRL AB 4-0 PS2 18 (SUTURE) IMPLANT
SUT VIC AB 0 CT1 36 (SUTURE) ×3 IMPLANT
SUT VIC AB 1 CT1 36 (SUTURE) ×3 IMPLANT
SUT VIC AB 2-0 CT1 27 (SUTURE) ×6
SUT VIC AB 2-0 CT1 TAPERPNT 27 (SUTURE) ×2 IMPLANT
TRAY FOLEY W/METER SILVER 16FR (SET/KITS/TRAYS/PACK) ×3 IMPLANT
YANKAUER SUCT BULB TIP NO VENT (SUCTIONS) ×3 IMPLANT

## 2016-10-04 NOTE — Anesthesia Postprocedure Evaluation (Signed)
Anesthesia Post Note  Patient: [name] [name].  Procedure(s) Performed: Procedure(s) (LRB): LEFT TOTAL HIP ARTHROPLASTY ANTERIOR APPROACH (Left)  Anesthesia Type: Spinal Level of consciousness: awake Pain management: pain level controlled Vital Signs Assessment: post-procedure vital signs reviewed and stable Cardiovascular status: stable Postop Assessment: spinal receding Anesthetic complications: no    Last Vitals:  Vitals:   10/04/16 1639 10/04/16 1728  BP: 124/72 122/83  Pulse: 68 79  Resp: 13 13  Temp: 36.4 C 36.6 C    Last Pain:  Vitals:   10/04/16 1728  TempSrc: Oral  PainSc:                  EDWARDS,Jeryl Umholtz

## 2016-10-04 NOTE — Progress Notes (Signed)
Pt is consistently getting out of bed without assistance. Discussed fall risk status and that he shouldn't get up without calling first. Patient keeps asking to sit on the side of the bed, then when nursing leaves the room, patient moves around the room on his own. During hand-off report, patient informed again not to get up on his own. At this point, patient seems alert and oriented. Assisted back to bed, bed alarm is on, and call bell in reach.

## 2016-10-04 NOTE — Anesthesia Preprocedure Evaluation (Addendum)
Anesthesia Evaluation  Patient identified by MRN, date of birth, ID band Patient awake    Reviewed: Allergy & Precautions, NPO status , Patient's Chart, lab work & pertinent test results  Airway Mallampati: II  TM Distance: >3 FB     Dental   Pulmonary Current Smoker,    breath sounds clear to auscultation       Cardiovascular hypertension,  Rhythm:Regular Rate:Normal     Neuro/Psych    GI/Hepatic GERD  ,History noted. CE   Endo/Other  diabetes  Renal/GU negative Renal ROS     Musculoskeletal  (+) Arthritis ,   Abdominal   Peds  Hematology   Anesthesia Other Findings   Reproductive/Obstetrics                            Anesthesia Physical Anesthesia Plan  ASA: III  Anesthesia Plan: Spinal   Post-op Pain Management:    Induction: Intravenous  Airway Management Planned: Simple Face Mask  Additional Equipment:   Intra-op Plan:   Post-operative Plan:   Informed Consent: I have reviewed the patients History and Physical, chart, labs and discussed the procedure including the risks, benefits and alternatives for the proposed anesthesia with the patient or authorized representative who has indicated his/her understanding and acceptance.   Dental advisory given  Plan Discussed with: Anesthesiologist and CRNA  Anesthesia Plan Comments:         Anesthesia Quick Evaluation

## 2016-10-04 NOTE — Transfer of Care (Signed)
Immediate Anesthesia Transfer of Care Note  Patient: [name] [name].  Procedure(s) Performed: Procedure(s): LEFT TOTAL HIP ARTHROPLASTY ANTERIOR APPROACH (Left)  Patient Location: PACU  Anesthesia Type:Spinal  Level of Consciousness: awake, alert  and oriented  Airway & Oxygen Therapy: Patient Spontanous Breathing and Patient connected to face mask oxygen  Post-op Assessment: Report given to RN and Post -op Vital signs reviewed and stable  Post vital signs: Reviewed and stable  Last Vitals:  Vitals:   10/04/16 1006  BP: 125/77  Pulse: 87  Resp: 18  Temp: 37.4 C    Last Pain:  Vitals:   10/04/16 1006  TempSrc: Oral         Complications: No apparent anesthesia complications

## 2016-10-04 NOTE — Brief Op Note (Signed)
10/04/2016  2:20 PM  PATIENT:  [name] [name].  55 y.o. male  PRE-OPERATIVE DIAGNOSIS:  AVASCULAR NECROSIS LEFT HIP WITH FEMORAL HEAD COLLAPSE  POST-OPERATIVE DIAGNOSIS:  AVASCULAR NECROSIS LEFT HIP WITH FEMORAL HEAD   PROCEDURE:  Procedure(s): LEFT TOTAL HIP ARTHROPLASTY ANTERIOR APPROACH (Left)  SURGEON:  Surgeon(s) and Role:    * Naiping Ephriam Jenkins, MD - Assisting    * Mcarthur Rossetti, MD - Primary  ANESTHESIA:   spinal  EBL:  Total I/O In: 2000 [I.V.:2000] Out: 400 [Urine:200; Blood:200]  COUNTS:  YES  DICTATION: .Other Dictation: Dictation Number [id number]  PLAN OF [name]: Admit to inpatient   PATIENT DISPOSITION:  PACU - hemodynamically stable.   Delay start of Pharmacological VTE agent (>24hrs) due to surgical blood loss or risk of bleeding: no

## 2016-10-04 NOTE — H&P (Signed)
TOTAL HIP ADMISSION H&P  Patient is admitted for left total hip arthroplasty.  Subjective:  Chief Complaint: left hip pain  HPI: [name] Care., 55 y.o. male, has a history of pain and functional disability in the left hip(s) due to avascular necrosis from alcohol abuse and patient has failed non-surgical conservative treatments for greater than 12 weeks to include activity modification.  Onset of symptoms was gradual starting 5 years ago with rapidlly worsening course since that time.The patient noted no past surgery on the left hip(s).  Patient currently rates pain in the left hip at 10 out of 10 with activity. Patient has night pain, worsening of pain with activity and weight bearing, trendelenberg gait, pain that interfers with activities of daily living, pain with passive range of motion and crepitus. Patient has evidence of subchondral cysts, joint space narrowing and femoral head collapse by imaging studies. This condition presents safety issues increasing the risk of falls.  There is no current active infection.  Patient Active Problem List   Diagnosis Date Noted  . Avascular necrosis of hip, left ([place]) 10/04/2016  . Rash and nonspecific skin eruption 08/12/2013  . Cholelithiases 08/04/2013  . Subcutaneous nodule 07/21/2013  . Acute alcoholic pancreatitis [id number]  . Syncope 04/11/2013  . Prolonged Q-T interval on ECG 04/11/2013  . Cocaine abuse 03/13/2013    Class: Chronic  . Polysubstance abuse 02/14/2013  . DDD (degenerative disc disease), lumbar 02/01/2013  . Dyspepsia 12/01/2012  . BPH (benign prostatic hyperplasia) 10/07/2012  . Allergic rhinitis 10/03/2012  . Transaminitis 10/02/2012  . Chronic back pain 10/02/2012  . Essential hypertension, benign 07/01/2012  . Tobacco user 07/01/2012  . Hepatic steatosis 03/06/2012  . ED (erectile dysfunction) 03/06/2012  . Pseudocyst of pancreas 02/28/2012  . Alcohol abuse 02/25/2012  . Allergic rhinitis 02/23/2012  . GERD  (gastroesophageal reflux disease) 02/23/2012  . Bipolar 1 disorder ([place]) 02/23/2012  . Hypertriglyceridemia 12/05/2011   Past Medical History:  Diagnosis Date  . Acid reflux   . Alcohol abuse    6-8 cans of beer daily  . Arthritis   . Bipolar disorder ([place])   . Bronchitis   . Chronic back pain   . Chronic neck pain   . Chronic pain   . Diabetes mellitus without complication ([place])   . Fracture of lower leg   . Gout   . Hyperlipidemia   . Hypertension   . Left arm pain    chronic  . Pancreatitis    March 2013  . Pseudocyst of pancreas 02/28/2012    Past Surgical History:  Procedure Laterality Date  . CIRCUMCISION  03/17/2012   Procedure: CIRCUMCISION ADULT;  Surgeon: Marissa Nestle, MD;  Location: [facility];  Service: Urology;  Laterality: N/A;  . COLONOSCOPY WITH PROPOFOL  12/24/2012   [id number] lesion as described above status post biopsy; otherwise normal rectum/Sigmoid diverticulosis/Sigmoid polyps -- resected as described above. anal lesion, benign. colon polyp, hyperplastic  . ESOPHAGOGASTRODUODENOSCOPY (EGD) WITH PROPOFOL  12/24/2012   [id number] erythema and erosions of uncertain significance status post gastric biopsy (reactive gastropathy NO h.pylori)  . NOSE SURGERY     broken nose    No prescriptions prior to admission.   Allergies  Allergen Reactions  . Penicillins Itching    Has patient had a PCN reaction causing immediate rash, facial/tongue/throat swelling, SOB or lightheadedness with hypotension: No Has patient had a PCN reaction causing severe rash involving mucus membranes or skin necrosis: No Has patient had a PCN reaction that  required hospitalization No Has patient had a PCN reaction occurring within the last 10 years: No If all of the above answers are "NO", then may proceed with Cephalosporin use.     Social History  Substance Use Topics  . Smoking status: Current Every Day Smoker    Packs/day: 0.50    Years: 25.00    Types: Cigarettes  .  Smokeless tobacco: Former Systems developer    Types: Chew  . Alcohol use 0.0 oz/week     Comment: 6-8 beers a day    Family History  Problem Relation Age of Onset  . Diabetes Father   . Anesthesia problems Neg Hx   . Hypotension Neg Hx   . Malignant hyperthermia Neg Hx   . Pseudochol deficiency Neg Hx   . Colon cancer Neg Hx      Review of Systems  Musculoskeletal: Positive for joint pain.  All other systems reviewed and are negative.   Objective:  Physical Exam  Constitutional: He is oriented to person, place, and time. He appears well-developed and well-nourished.  HENT:  Head: Normocephalic and atraumatic.  Eyes: EOM are normal. Pupils are equal, round, and reactive to light.  Neck: Normal range of motion. Neck supple.  Cardiovascular: Normal rate and regular rhythm.   Respiratory: Effort normal and breath sounds normal.  GI: Soft. Bowel sounds are normal.  Musculoskeletal:       Left hip: He exhibits decreased range of motion, decreased strength, tenderness, bony tenderness, crepitus and deformity.  Neurological: He is alert and oriented to person, place, and time.  Skin: Skin is warm and dry.  Psychiatric: He has a normal mood and affect.    Vital signs in last 24 hours:    Labs:   Estimated body mass index is 29.13 kg/m as calculated from the following:   Height as of 10/02/16: 5\' 10"  (1.778 m).   Weight as of 10/02/16: 92.1 kg (203 lb).   Imaging Review Plain radiographs demonstrate AVN of the left hip with femoral head collapse  Assessment/Plan:  End stage avascular necrosis, left hip(s)  The patient history, physical examination, clinical judgement of the provider and imaging studies are consistent with end stage AVNof the left hip(s) and total hip arthroplasty is deemed medically necessary. The treatment options including medical management, injection therapy, arthroscopy and arthroplasty were discussed at length. The risks and benefits of total hip arthroplasty  were presented and reviewed. The risks due to aseptic loosening, infection, stiffness, dislocation/subluxation,  thromboembolic complications and other imponderables were discussed.  The patient acknowledged the explanation, agreed to proceed with the plan and consent was signed. Patient is being admitted for inpatient treatment for surgery, pain control, PT, OT, prophylactic antibiotics, VTE prophylaxis, progressive ambulation and ADL's and discharge planning.The patient is planning to be discharged to skilled nursing facility

## 2016-10-04 NOTE — Anesthesia Procedure Notes (Signed)
Spinal  Patient location during procedure: OR End time: 10/04/2016 12:39 PM Staffing Resident/CRNA: Noralyn Pick D Performed: anesthesiologist and resident/CRNA  Preanesthetic Checklist Completed: patient identified, site marked, surgical consent, pre-op evaluation, timeout performed, IV checked, risks and benefits discussed and monitors and equipment checked Spinal Block Patient position: sitting Prep: Betadine Patient monitoring: heart rate, continuous pulse ox and blood pressure Approach: right paramedian Location: L2-3 Injection technique: single-shot Needle Needle type: Pencan  Needle gauge: 24 G Needle length: 9 cm Assessment Sensory level: T6 Additional Notes Expiration date of kit checked and confirmed. Patient tolerated procedure well, without complications.

## 2016-10-05 LAB — GLUCOSE, CAPILLARY
Glucose-Capillary: 174 mg/dL — ABNORMAL HIGH (ref 65–99)
Glucose-Capillary: 116 mg/dL — ABNORMAL HIGH (ref 65–99)
Glucose-Capillary: 109 mg/dL — ABNORMAL HIGH (ref 65–99)
Glucose-Capillary: 96 mg/dL (ref 65–99)

## 2016-10-05 LAB — CBC
HCT: 36 % — ABNORMAL LOW (ref 39.0–52.0)
Hemoglobin: 11.9 g/dL — ABNORMAL LOW (ref 13.0–17.0)
MCH: 30.1 pg (ref 26.0–34.0)
MCHC: 33.1 g/dL (ref 30.0–36.0)
MCV: 90.9 fL (ref 78.0–100.0)
Platelets: 260 10*3/uL (ref 150–400)
RBC: 3.96 MIL/uL — ABNORMAL LOW (ref 4.22–5.81)
RDW: 15.1 % (ref 11.5–15.5)
WBC: 5.1 10*3/uL (ref 4.0–10.5)

## 2016-10-05 LAB — BASIC METABOLIC PANEL
Anion gap: 5 (ref 5–15)
BUN: 9 mg/dL (ref 6–20)
CO2: 30 mmol/L (ref 22–32)
Calcium: 8.6 mg/dL — ABNORMAL LOW (ref 8.9–10.3)
Chloride: 104 mmol/L (ref 101–111)
Creatinine, Ser: 0.76 mg/dL (ref 0.61–1.24)
GFR calc Af Amer: >60 mL/min (ref >60)
GFR calc non Af Amer: >60 mL/min (ref >60)
Glucose, Bld: 100 mg/dL — ABNORMAL HIGH (ref 65–99)
Potassium: 3.6 mmol/L (ref 3.5–5.1)
Sodium: 139 mmol/L (ref 135–145)

## 2016-10-05 MED ORDER — PANTOPRAZOLE SODIUM 40 MG PO TBEC
40.0000 mg | DELAYED_RELEASE_TABLET | Freq: Every day | ORAL | Status: DC
  Administered 2016-10-06 – 2016-10-08 (×3): 40 mg via ORAL
  Filled 2016-10-05 (×3): qty 1

## 2016-10-05 MED ORDER — AMLODIPINE BESYLATE 5 MG PO TABS
5.0000 mg | ORAL_TABLET | Freq: Once | ORAL | Status: AC
  Administered 2016-10-05: 5 mg via ORAL
  Filled 2016-10-05: qty 1

## 2016-10-05 MED ORDER — AMLODIPINE BESYLATE 5 MG PO TABS
5.0000 mg | ORAL_TABLET | Freq: Every day | ORAL | Status: DC
  Administered 2016-10-06 – 2016-10-08 (×2): 5 mg via ORAL
  Filled 2016-10-05 (×2): qty 1

## 2016-10-05 MED ORDER — TAMSULOSIN HCL 0.4 MG PO CAPS
0.4000 mg | ORAL_CAPSULE | Freq: Every day | ORAL | Status: DC
  Administered 2016-10-06 – 2016-10-08 (×3): 0.4 mg via ORAL
  Filled 2016-10-05 (×3): qty 1

## 2016-10-05 MED ORDER — TAMSULOSIN HCL 0.4 MG PO CAPS
0.4000 mg | ORAL_CAPSULE | Freq: Once | ORAL | Status: AC
  Administered 2016-10-05: 0.4 mg via ORAL
  Filled 2016-10-05: qty 1

## 2016-10-05 MED ORDER — PANTOPRAZOLE SODIUM 40 MG PO TBEC
40.0000 mg | DELAYED_RELEASE_TABLET | Freq: Once | ORAL | Status: AC
Start: 2016-10-05 — End: 2016-10-05
  Administered 2016-10-05: 40 mg via ORAL
  Filled 2016-10-05: qty 1

## 2016-10-05 NOTE — Evaluation (Signed)
Physical Therapy Evaluation Patient Details Name: [name] [name]. MRN: [id number] DOB: 10-02-1961 Today's Date: 10/05/2016   History of Present Illness  55 yo male s/p L THa-direct anterior 10/04/16. hx of DM, polysubstance abuse  Clinical Impression  On eval, pt required Min assist for mobility. He walked ~125 feet with a RW. Pain rated 6/10 with activity. Will follow and progress activity as tolerated. Plan is for d/c to SNF for ST rehab.     Follow Up Recommendations SNF    Equipment Recommendations   (TBD at next venue)    Recommendations for Other Services       Precautions / Restrictions Precautions Precautions: Fall Restrictions Weight Bearing Restrictions: No LLE Weight Bearing: Weight bearing as tolerated      Mobility  Bed Mobility Overal bed mobility: Needs Assistance Bed Mobility: Supine to Sit     Supine to sit: Min assist;HOB elevated     General bed mobility comments: Assist for L LE  Transfers Overall transfer level: Needs assistance Equipment used: Rolling walker (2 wheeled) Transfers: Sit to/from Stand Sit to Stand: Min guard;From elevated surface         General transfer comment: close guard for safety. VCs safety, hand/LE placement  Ambulation/Gait Ambulation/Gait assistance: Min assist Ambulation Distance (Feet): 125 Feet Assistive device: Rolling walker (2 wheeled) Gait Pattern/deviations: Step-through pattern;Decreased stride length     General Gait Details: close guard for safety. slow gait speed. Intermittent assist to stabilize.   Stairs            Wheelchair Mobility    Modified Rankin (Stroke Patients Only)       Balance Overall balance assessment: Needs assistance           Standing balance-Leahy Scale: Poor                               Pertinent Vitals/Pain Pain Assessment: 0-10 Pain Score: 6  Pain Location: L hip/thigh Pain Descriptors / Indicators: Aching;Tightness;Sore Pain  Intervention(s): Monitored during session;Repositioned;Ice applied    Home Living Family/patient expects to be discharged to:: [place]: Kasandra Knudsen - single point      Prior Function Level of Independence: Independent               Hand Dominance        Extremity/Trunk Assessment   Upper Extremity Assessment: Overall WFL for tasks assessed           Lower Extremity Assessment: Generalized weakness      Cervical / Trunk Assessment: Normal  Communication      Cognition Arousal/Alertness: Awake/alert Behavior During Therapy: WFL for tasks assessed/performed Overall Cognitive Status: Within Functional Limits for tasks assessed                      General Comments      Exercises     Assessment/Plan    PT Assessment Patient needs continued PT services  PT Problem List Decreased strength;Decreased mobility;Decreased balance;Decreased activity tolerance;Pain;Decreased knowledge of use of DME;Decreased range of motion          PT Treatment Interventions DME instruction;Therapeutic activities;Gait training;Therapeutic exercise;Balance training;Functional mobility training;Patient/family education    PT Goals (Current goals can be found in the Care Plan section)  Acute Rehab PT Goals Patient Stated Goal: regain independence. less pain. PT Goal Formulation:  With patient Time For Goal Achievement: 10/19/16 Potential to Achieve Goals: Good    Frequency 7X/week   Barriers to discharge        Co-evaluation               End of Session   Activity Tolerance: Patient tolerated treatment well Patient left: in chair;with call bell/phone within reach           Time: 1010-1030 PT Time Calculation (min) (ACUTE ONLY): 20 min   Charges:   PT Evaluation $PT Eval Low Complexity: 1 Procedure     PT G Codes:        Weston Anna, MPT Pager: [phone]

## 2016-10-05 NOTE — Progress Notes (Signed)
OT Cancellation Note  Patient Details Name: [name] [name]. MRN: [id number] DOB: Oct 31, 1961   Cancelled Treatment:    Noted plan for SNF- will defer OT eval to SNF  [place], Edwena Felty D 10/05/2016, 12:17 PM

## 2016-10-05 NOTE — Progress Notes (Signed)
Consulted with Dr Ninfa Linden re should pt get immunizations during this admission. He said that pt should not. Orel Cooler, [employer]

## 2016-10-05 NOTE — Op Note (Signed)
NAMECRUSOE, OATLEY NO.:  [id number]  MEDICAL RECORD NO.:  [id number]  LOCATION:  [place]                         FACILITY:  [facility]  PHYSICIAN:  Lind Guest. Ninfa Linden, M.D.DATE OF BIRTH:  September 16, 1961  DATE OF PROCEDURE:  10/04/2016 DATE OF DISCHARGE:                              OPERATIVE REPORT   PREOPERATIVE DIAGNOSIS:  Severe avascular necrosis with femoral head collapse, left hip.  POSTOPERATIVE DIAGNOSIS:  Severe avascular necrosis with femoral head collapse, left hip.  PROCEDURE:  Left total hip arthroplasty through the direct anterior approach.  IMPLANTS:  DePuy Sector Gription acetabular component size 54, size 36+ 4 neutral polyethylene liner, size 13 Corail femoral component with varus offset (KLA), size 36+ 1.5 ceramic hip ball.  SURGEON:  Lind Guest. Ninfa Linden, M.D.  ASSISTANT SURGEON:  Eduard Roux, MD.  ANTIBIOTICS:  900 mg of IV clindamycin.  BLOOD LOSS:  250-300 mL.  COMPLICATIONS:  None.  INDICATIONS:  [name] [name] is a 55 year old gentleman, well known to me.  He has avascular necrosis secondary to years of alcohol abuse.  He has also had cocaine abuse as well.  He is a diabetic and he cleaned his life up as much as he can.  He has not been abusing drugs some time now and he has backed off on his alcohol use.  His hip shows radiographic evidence of femoral head collapse and severe AVN.  He walks with a significant limp and his pain is quite severe to this point that we have recommended a hip replacement surgery.  We are working on his blood glucose control and taken care of his psychosocial issues.  He does wish to proceed with hip replacement to help with his pain.  I agree with performing the surgery based on him being able to clean his life up as much as he can. He understands the risks of acute blood loss anemia, nerve and vessel injury, fracture, infection, dislocation, DVT.  He understands our goals are decreased pain, improved  mobility and overall improved quality of life.  PROCEDURE DESCRIPTION:  After informed consent was obtained, appropriate left hip was marked.  He was brought to the operating room where a spinal anesthesia was obtained while he was on a stretcher.  He was then laid in a supine position on a stretcher.  Foley catheter was placed and both feet had traction boots applied to them.  Next, he was placed supine on the Hana fracture table with the perineal post in place, both legs in inline skeletal traction devices, but no traction applied.  His left operative hip was prepped and draped with DuraPrep and sterile drapes.  A time-out was called, he was identified as correct patient and correct left hip.  I then made an incision inferior and posterior to the anterior superior iliac spine and carried this obliquely down the leg. We dissected down the tensor fascia lata muscle.  The tensor fascia was then divided longitudinally to proceed with a direct anterior approach to the hip.  We identified and cauterized the circumflex vessels, and then identified the hip capsule, opened up the hip capsule, finding a femoral head that was completely collapsed with severe avascular  necrosis.  Using oscillating saw, we made our femoral neck cut proximal to lesser trochanter and completed this with an osteotome.  I placed a corkscrew guide in the femoral head and removed the femoral head and its remnants in its entirety and found it to be completely collapsed due to severe avascular necrosis.  We then cleaned the acetabulum and remnants of the acetabular labrum and other debris.  I placed a bent Hohmann over the medial acetabular rim and cleaned the remnants of the acetabular labrum.  We then began reaming under direct visualization starting with a 43.  We then jumped and increments all the way up to a size 54, with all reamers under direct visualization, the last reamer under direct fluoroscopy, so I could  obtain my depth of reaming, my inclination and anteversion.  Once we were pleased with this, we placed the real DePuy Sector Gription acetabular component size 54, and a 36+ 4 neutral polyethylene liner due to some medialization and his varus deformity of his hip.  Attention was then turned to the femur.  With the leg externally rotated to 120 degrees, extended and adducted, we were able to bring the leg down and under and placed a Mueller retractor medially and a Hohmann retractor behind the greater trochanter.  I released the lateral joint capsule and used a box cutting osteotome to enter the femoral canal and a rongeur to lateralize.  We then began broaching from a size 8 broach using the Corail broaching system up to a size 13.  With the 13, we trialed a varus offset femoral neck and a 36+ 1.5 hip ball. We brought the leg back over and up with traction and internal rotation reducing the pelvis and then assessed it under fluoroscopy and direct visualization and range of motion.  We were pleased with stability, offset and leg length.  We then dislocated the hip and removed the trial components.  We were able to place the real Corail femoral component size 13 with varus offset and real 36+ 1.5 ceramic hip ball.  We reduced this in the acetabulum and again, we were pleased with stability.  We then irrigated the soft tissue with normal saline solution using pulsatile lavage.  We closed the remnants of the joint capsule with interrupted #1 Ethibond suture followed by running #1 Vicryl in the tensor fascia, 0 Vicryl in the deep tissue, 2-0 Vicryl in the subcutaneous tissue, 4-0 Monocryl subcuticular stitch and Steri-Strips on the skin.  An Aquacel dressing was then applied.  He was taken off the Hana table and taken to the recovery room in stable condition.  All final counts were correct.  There were no complications noted.     Lind Guest. Ninfa Linden, M.D.     CYB/MEDQ  D:   10/04/2016  T:  10/05/2016  Job:  [id number]

## 2016-10-05 NOTE — Progress Notes (Signed)
Physical Therapy Treatment Patient Details Name: Koben Robies. MRN: [id number] DOB: 05/21/1961 Today's Date: 10/05/2016    History of Present Illness 55 yo male s/p L THa-direct anterior 10/04/16. hx of DM, polysubstance abuse    PT Comments    Progressing well with mobility.   Follow Up Recommendations  SNF     Equipment Recommendations   (TBD at next venue)    Recommendations for Other Services       Precautions / Restrictions Precautions Precautions: Fall Restrictions Weight Bearing Restrictions: No LLE Weight Bearing: Weight bearing as tolerated    Mobility  Bed Mobility Overal bed mobility: Needs Assistance Bed Mobility: Supine to Sit;Sit to Supine     Supine to sit: Min assist Sit to supine: Min assist   General bed mobility comments: Assist for L LE  Transfers Overall transfer level: Needs assistance Equipment used: Rolling walker (2 wheeled) Transfers: Sit to/from Stand Sit to Stand: Min guard         General transfer comment: close guard for safety. VCs safety, hand/LE placement  Ambulation/Gait Ambulation/Gait assistance: Min guard Ambulation Distance (Feet): 185 Feet Assistive device: Rolling walker (2 wheeled) Gait Pattern/deviations: Step-through pattern;Decreased stride length     General Gait Details: close guard for safety. slow gait speed   Stairs            Wheelchair Mobility    Modified Rankin (Stroke Patients Only)       Balance Overall balance assessment: Needs assistance           Standing balance-Leahy Scale: Poor                      Cognition Arousal/Alertness: Awake/alert Behavior During Therapy: WFL for tasks assessed/performed Overall Cognitive Status: Within Functional Limits for tasks assessed                      Exercises      General Comments        Pertinent Vitals/Pain Pain Assessment: 0-10 Pain Score: 6  Pain Location: L hip/thigh Pain Descriptors / Indicators:  Aching;Tightness;Sore Pain Intervention(s): Monitored during session;Repositioned;Ice applied    Home Living                      Prior Function            PT Goals (current goals can now be found in the care plan section) Acute Rehab PT Goals Patient Stated Goal: regain independence. less pain. PT Goal Formulation: With patient Time For Goal Achievement: 10/19/16 Potential to Achieve Goals: Good Progress towards PT goals: Progressing toward goals    Frequency    7X/week      PT Plan Current plan remains appropriate    Co-evaluation             End of Session   Activity Tolerance: Patient tolerated treatment well Patient left: with call bell/phone within reach;in bed;with bed alarm set     Time: 1353-1408 PT Time Calculation (min) (ACUTE ONLY): 15 min  Charges:  $Gait Training: 8-22 mins                    G Codes:      Weston Anna, MPT Pager: [phone]

## 2016-10-05 NOTE — Progress Notes (Signed)
Subjective: 1 Day Post-Op Procedure(s) (LRB): LEFT TOTAL HIP ARTHROPLASTY ANTERIOR APPROACH (Left) Patient reports pain as moderate.    Objective: Vital signs in last 24 hours: Temp:  [97.3 F (36.3 C)-99.3 F (37.4 C)] 98.1 F (36.7 C) (10/14 0437) Pulse Rate:  [67-87] 73 (10/14 0437) Resp:  [11-18] 16 (10/14 0437) BP: (101-130)/(54-83) 114/73 (10/14 0437) SpO2:  [95 %-100 %] 96 % (10/14 0437) Weight:  [92.1 kg (203 lb)] 92.1 kg (203 lb) (10/13 0951)  Intake/Output from previous day: 10/13 0701 - 10/14 0700 In: 4255 [P.O.:1700; I.V.:2500; IV Piggyback:55] Out: [id number] [Urine:4650; Blood:200] Intake/Output this shift: No intake/output data recorded.   Recent Labs  10/04/16 1127 10/05/16 0507  HGB 13.5 11.9*    Recent Labs  10/04/16 1127 10/05/16 0507  WBC 5.3 5.1  RBC 4.48 3.96*  HCT 40.4 36.0*  PLT 326 260    Recent Labs  10/04/16 1127 10/05/16 0507  NA 138 139  K 3.7 3.6  CL 104 104  CO2 27 30  BUN 13 9  CREATININE 0.91 0.76  GLUCOSE 92 100*  CALCIUM 9.1 8.6*   No results for input(s): LABPT, INR in the last 72 hours.  Sensation intact distally Intact pulses distally Dorsiflexion/Plantar flexion intact Dressing clean and dry   Assessment/Plan: 1 Day Post-Op Procedure(s) (LRB): LEFT TOTAL HIP ARTHROPLASTY ANTERIOR APPROACH (Left) Up with therapy Discharge to SNF Monday  Mcarthur Rossetti 10/05/2016, 7:43 AM

## 2016-10-06 LAB — GLUCOSE, CAPILLARY
Glucose-Capillary: 109 mg/dL — ABNORMAL HIGH (ref 65–99)
Glucose-Capillary: 122 mg/dL — ABNORMAL HIGH (ref 65–99)
Glucose-Capillary: 107 mg/dL — ABNORMAL HIGH (ref 65–99)
Glucose-Capillary: 120 mg/dL — ABNORMAL HIGH (ref 65–99)

## 2016-10-06 MED ORDER — OXYCODONE HCL 5 MG PO TABS: 5.0000 mg | ORAL_TABLET | ORAL | 0 refills | Status: DC | PRN

## 2016-10-06 MED ORDER — ASPIRIN 325 MG PO TBEC: 325.0000 mg | DELAYED_RELEASE_TABLET | Freq: Two times a day (BID) | ORAL | 0 refills | Status: DC

## 2016-10-06 NOTE — Progress Notes (Signed)
Brought to Dr Trevor Mace attention pt has orders for both flu & pna vaccines. He stated he did not want pt to receive either. Will cancel orders. Loriann Bosserman, [employer]

## 2016-10-06 NOTE — Discharge Instructions (Signed)

## 2016-10-06 NOTE — Progress Notes (Signed)
Physical Therapy Treatment Patient Details Name: [name] [name]. MRN: [id number] DOB: August 14, 1961 Today's Date: 10/06/2016    History of Present Illness 55 yo male s/p L THa-direct anterior 10/04/16. hx of DM, polysubstance abuse    PT Comments    Progressing well with mobility. Continue to recommend SNF.  Follow Up Recommendations  SNF     Equipment Recommendations       Recommendations for Other Services       Precautions / Restrictions Precautions Precautions: Fall Restrictions Weight Bearing Restrictions: No LLE Weight Bearing: Weight bearing as tolerated    Mobility  Bed Mobility               General bed mobility comments: sitting EOB at start of session  Transfers Overall transfer level: Needs assistance Equipment used: Rolling walker (2 wheeled) Transfers: Sit to/from Stand Sit to Stand: Min guard         General transfer comment: close guard for safety. VCs safety, hand/LE placement  Ambulation/Gait Ambulation/Gait assistance: Min guard Ambulation Distance (Feet): 190 Feet Assistive device: Rolling walker (2 wheeled) Gait Pattern/deviations: Step-through pattern;Decreased stride length     General Gait Details: close guard for safety. slow gait speed   Stairs            Wheelchair Mobility    Modified Rankin (Stroke Patients Only)       Balance                                    Cognition Arousal/Alertness: Awake/alert Behavior During Therapy: WFL for tasks assessed/performed Overall Cognitive Status: Within Functional Limits for tasks assessed                      Exercises Total Joint Exercises Hip ABduction/ADduction: AROM;Left;10 reps;Standing Knee Flexion: AROM;Left;10 reps;Standing Marching in Standing: AROM;Both;10 reps;Standing General Exercises - Lower Extremity Heel Raises: AROM;Both;10 reps;Standing    General Comments        Pertinent Vitals/Pain Pain Assessment: 0-10 Pain  Score: 6  Pain Location: L hip/thigh Pain Descriptors / Indicators: Aching;Tightness;Sore Pain Intervention(s): Monitored during session;Repositioned;Ice applied    Home Living                      Prior Function            PT Goals (current goals can now be found in the care plan section) Progress towards PT goals: Progressing toward goals    Frequency    7X/week      PT Plan Current plan remains appropriate    Co-evaluation             End of Session Equipment Utilized During Treatment: Gait belt Activity Tolerance: Patient tolerated treatment well Patient left: in chair;with call bell/phone within reach     Time: 0935-0957 PT Time Calculation (min) (ACUTE ONLY): 22 min  Charges:  $Gait Training: 8-22 mins                    G Codes:      Weston Anna, MPT Pager: [phone]

## 2016-10-06 NOTE — Care Management Note (Addendum)
Case Management Note  Patient Details  Name: [name] [name]. MRN: [id number] Date of Birth: 08/22/1961  Subjective/Objective:       S/p L THA              Action/Plan: Discharge Planning: NCM spoke to pt and states he plans to go to SNF-rehab. Pt states he lives at home alone. CSW referral for SNF.  CSW following for SNF placement.   Expected Discharge Date:  10/07/16               Expected Discharge Plan:  Skilled Nursing Facility  In-House Referral:  Clinical Social Work  Discharge planning Services  CM Consult  Post Acute Care Choice:  NA Choice offered to:  NA  DME Arranged:  N/A DME Agency:  NA  HH Arranged:  NA HH Agency:  NA  Status of Service:  Completed, signed off  If discussed at [employer] of Stay Meetings, dates discussed:    Additional Comments:  Erenest Rasher, RN 10/06/2016, 10:38 AM

## 2016-10-06 NOTE — Progress Notes (Signed)
Subjective: 2 Days Post-Op Procedure(s) (LRB): LEFT TOTAL HIP ARTHROPLASTY ANTERIOR APPROACH (Left) Patient reports pain as moderate.  Mobilizing very well with therapy.  Objective: Vital signs in last 24 hours: Temp:  [97.6 F (36.4 C)-98.2 F (36.8 C)] 98.2 F (36.8 C) (10/15 0736) Pulse Rate:  [71-77] 71 (10/15 0736) Resp:  [15-18] 15 (10/15 0736) BP: (106-120)/(60-78) 118/74 (10/15 0736) SpO2:  [99 %-100 %] 100 % (10/15 0736)  Intake/Output from previous day: 10/14 0701 - 10/15 0700 In: 2120 [P.O.:2120] Out: 4350 [Urine:4350] Intake/Output this shift: Total I/O In: 360 [P.O.:360] Out: -    Recent Labs  10/04/16 1127 10/05/16 0507  HGB 13.5 11.9*    Recent Labs  10/04/16 1127 10/05/16 0507  WBC 5.3 5.1  RBC 4.48 3.96*  HCT 40.4 36.0*  PLT 326 260    Recent Labs  10/04/16 1127 10/05/16 0507  NA 138 139  K 3.7 3.6  CL 104 104  CO2 27 30  BUN 13 9  CREATININE 0.91 0.76  GLUCOSE 92 100*  CALCIUM 9.1 8.6*   No results for input(s): LABPT, INR in the last 72 hours.  Sensation intact distally Intact pulses distally Dorsiflexion/Plantar flexion intact Incision: dressing C/D/I  Assessment/Plan: 2 Days Post-Op Procedure(s) (LRB): LEFT TOTAL HIP ARTHROPLASTY ANTERIOR APPROACH (Left) Up with therapy Plan for discharge tomorrow  Likely needs short-term skilled nursing because he stays at a cousins house and lives on the couch there with no family support.  [name] [name] 10/06/2016, 11:15 AM

## 2016-10-07 ENCOUNTER — Encounter (HOSPITAL_COMMUNITY): Payer: Self-pay | Admitting: Orthopaedic Surgery

## 2016-10-07 LAB — GLUCOSE, CAPILLARY
Glucose-Capillary: 96 mg/dL (ref 65–99)
Glucose-Capillary: 79 mg/dL (ref 65–99)
Glucose-Capillary: 90 mg/dL (ref 65–99)
Glucose-Capillary: 120 mg/dL — ABNORMAL HIGH (ref 65–99)
Glucose-Capillary: 97 mg/dL (ref 65–99)

## 2016-10-07 MED ORDER — POLYETHYLENE GLYCOL 3350 17 G PO PACK
17.0000 g | PACK | Freq: Every day | ORAL | Status: DC | PRN
  Administered 2016-10-07 – 2016-10-08 (×2): 17 g via ORAL
  Filled 2016-10-07 (×2): qty 1

## 2016-10-07 MED ORDER — GABAPENTIN 300 MG PO CAPS: 300.0000 mg | ORAL_CAPSULE | Freq: Every day | ORAL | Status: DC

## 2016-10-07 NOTE — NC FL2 (Signed)
[place] LEVEL OF CARE SCREENING TOOL     IDENTIFICATION  Patient Name: [name] [name]. Birthdate: [birth year] Sex: male Admission Date (Current Location): 10/04/2016  [facility] and [place] Number:  Engineer, manufacturing systems and Address:  [facility],  [address], [place]      Provider Number: [id number]  Attending Physician Name and Address:  Mcarthur Rossetti, *  Relative Name and Phone Number:       Current Level of Care: Hospital Recommended Level of Care: [place] Prior Approval Number:    Date Approved/Denied:   PASRR Number: [id number]  Discharge Plan: SNF    Current Diagnoses: Patient Active Problem List   Diagnosis Date Noted  . Avascular necrosis of hip, left ([place]) 10/04/2016  . Status post left hip replacement 10/04/2016  . Rash and nonspecific skin eruption 08/12/2013  . Cholelithiases 08/04/2013  . Subcutaneous nodule 07/21/2013  . Acute alcoholic pancreatitis [id number]  . Syncope 04/11/2013  . Prolonged Q-T interval on ECG 04/11/2013  . Cocaine abuse 03/13/2013    Class: Chronic  . Polysubstance abuse 02/14/2013  . DDD (degenerative disc disease), lumbar 02/01/2013  . Dyspepsia 12/01/2012  . BPH (benign prostatic hyperplasia) 10/07/2012  . Allergic rhinitis 10/03/2012  . Transaminitis 10/02/2012  . Chronic back pain 10/02/2012  . Essential hypertension, benign 07/01/2012  . Tobacco user 07/01/2012  . Hepatic steatosis 03/06/2012  . ED (erectile dysfunction) 03/06/2012  . Pseudocyst of pancreas 02/28/2012  . Alcohol abuse 02/25/2012  . Allergic rhinitis 02/23/2012  . GERD (gastroesophageal reflux disease) 02/23/2012  . Bipolar 1 disorder ([place]) 02/23/2012  . Hypertriglyceridemia 12/05/2011    Orientation RESPIRATION BLADDER Height & Weight     Self, Time, Situation, Place  Normal Continent Weight: 203 lb (92.1 kg) Height:  5\' 10"  (177.8 cm)  BEHAVIORAL SYMPTOMS/MOOD NEUROLOGICAL  BOWEL NUTRITION STATUS      Continent Diet (Carb Modified)  AMBULATORY STATUS COMMUNICATION OF NEEDS Skin   Extensive Assist Verbally Normal                       Personal Care Assistance Level of Assistance  Bathing, Dressing Bathing Assistance: Limited assistance   Dressing Assistance: Limited assistance     Functional Limitations Info             SPECIAL CARE FACTORS FREQUENCY  PT (By licensed PT), OT (By licensed OT)     PT Frequency: 5 OT Frequency: 5            Contractures      Additional Factors Info  Code Status, Allergies Code Status Info: Fullcode Allergies Info: Penicillins           Current Medications (10/07/2016):  This is the current hospital active medication list Current Facility-Administered Medications  Medication Dose Route Frequency Provider Last Rate Last Dose  . 0.9 %  sodium chloride infusion   Intravenous Continuous Mcarthur Rossetti, MD   Stopped at 10/05/16 1100  . acetaminophen (TYLENOL) tablet 650 mg  650 mg Oral Q6H PRN Mcarthur Rossetti, MD   650 mg at 10/07/16 1202   Or  . acetaminophen (TYLENOL) suppository 650 mg  650 mg Rectal Q6H PRN Mcarthur Rossetti, MD      . alum & mag hydroxide-simeth (MAALOX/MYLANTA) 200-200-20 MG/5ML suspension 30 mL  30 mL Oral Q4H PRN Mcarthur Rossetti, MD      . amLODipine (NORVASC) tablet 5 mg  5 mg Oral  Q breakfast Mcarthur Rossetti, MD   5 mg at 10/06/16 [id number]  . aspirin EC tablet 325 mg  325 mg Oral BID PC Mcarthur Rossetti, MD   325 mg at 10/07/16 0854  . diphenhydrAMINE (BENADRYL) 12.5 MG/5ML elixir 12.5-25 mg  12.5-25 mg Oral Q4H PRN Mcarthur Rossetti, MD   12.5 mg at 10/07/16 0101  . docusate sodium (COLACE) capsule 100 mg  100 mg Oral BID Mcarthur Rossetti, MD   100 mg at 10/07/16 0854  . gemfibrozil (LOPID) tablet 600 mg  600 mg Oral BID AC Mcarthur Rossetti, MD   600 mg at 10/07/16 0854  . HYDROmorphone (DILAUDID) injection 1 mg  1 mg  Intravenous Q2H PRN Mcarthur Rossetti, MD   1 mg at 10/06/16 0006  . menthol-cetylpyridinium (CEPACOL) lozenge 3 mg  1 lozenge Oral PRN Mcarthur Rossetti, MD       Or  . phenol (CHLORASEPTIC) mouth spray 1 spray  1 spray Mouth/Throat PRN Mcarthur Rossetti, MD      . metFORMIN (GLUCOPHAGE) tablet 1,000 mg  1,000 mg Oral Q breakfast Mcarthur Rossetti, MD   1,000 mg at 10/07/16 0854  . methocarbamol (ROBAXIN) tablet 500 mg  500 mg Oral Q6H PRN Mcarthur Rossetti, MD   500 mg at 10/06/16 2009   Or  . methocarbamol (ROBAXIN) 500 mg in dextrose 5 % 50 mL IVPB  500 mg Intravenous Q6H PRN Mcarthur Rossetti, MD   500 mg at 10/04/16 1500  . metoCLOPramide (REGLAN) tablet 5-10 mg  5-10 mg Oral Q8H PRN Mcarthur Rossetti, MD       Or  . metoCLOPramide (REGLAN) injection 5-10 mg  5-10 mg Intravenous Q8H PRN Mcarthur Rossetti, MD      . multivitamin with minerals tablet 1 tablet  1 tablet Oral Daily Mcarthur Rossetti, MD   1 tablet at 10/07/16 0855  . ondansetron (ZOFRAN) tablet 4 mg  4 mg Oral Q6H PRN Mcarthur Rossetti, MD       Or  . ondansetron [facility]) injection 4 mg  4 mg Intravenous Q6H PRN Mcarthur Rossetti, MD   4 mg at 10/06/16 1152  . oxyCODONE (Oxy IR/ROXICODONE) immediate release tablet 5-15 mg  5-15 mg Oral Q3H PRN Mcarthur Rossetti, MD   15 mg at 10/07/16 1157  . pantoprazole (PROTONIX) EC tablet 40 mg  40 mg Oral Q breakfast Mcarthur Rossetti, MD   40 mg at 10/07/16 0855  . tamsulosin (FLOMAX) capsule 0.4 mg  0.4 mg Oral QAC breakfast Mcarthur Rossetti, MD   0.4 mg at 10/07/16 [id number]     Discharge Medications: Please see discharge summary for a list of discharge medications.  Relevant Imaging Results:  Relevant Lab Results:   Additional Information SSN: [id number]  Standley Brooking, LCSW

## 2016-10-07 NOTE — Progress Notes (Signed)
Physical Therapy Treatment Patient Details Name: [name] [name]. MRN: [id number] DOB: 05-Jun-1961 Today's Date: 10/07/2016    History of Present Illness 55 yo male s/p L THa-direct anterior 10/04/16. hx of DM, polysubstance abuse    PT Comments    Pt is unsure of d/c plan at this time. Encouraged him to speak with CSW/CM. Practiced stair negotiation in case pt discharges home. Pt required Min assist for bed mobility (LE onto bed) and Min assist for stair negotiation. Pt will absolutely need assistance to safely climb stairs to enter home. Pt reports he lives with a family member who is physically unable to assist him. Continue to recommend ST rehab at SNF to maximize independence and safety with functional mobility.    Follow Up Recommendations  SNF     Equipment Recommendations  Rolling walker with 5" wheels (if pt discharges home)    Recommendations for Other Services       Precautions / Restrictions Precautions Precautions: Fall Restrictions Weight Bearing Restrictions: No LLE Weight Bearing: Weight bearing as tolerated    Mobility  Bed Mobility Overal bed mobility: Needs Assistance Bed Mobility: Supine to Sit;Sit to Supine     Supine to sit: Min guard Sit to supine: Min assist   General bed mobility comments: Assist for L LE onto bed.   Transfers Overall transfer level: Needs assistance Equipment used: Rolling walker (2 wheeled) Transfers: Sit to/from Stand Sit to Stand: Min guard         General transfer comment: close guard for safety. VCs safety, hand/LE placement  Ambulation/Gait Ambulation/Gait assistance: Min guard Ambulation Distance (Feet): 150 Feet Assistive device: Rolling walker (2 wheeled) Gait Pattern/deviations: Step-through pattern;Decreased stride length     General Gait Details: close guard for safety. slow gait speed.    Stairs Stairs: Yes Stairs assistance: Min assist Stair Management: Backwards;Forwards;One rail Right;Step to  pattern Number of Stairs: 2 General stair comments: Practiced steps x 2: once with 1 HHA and handrail, once with RW backwards. Pt reported he will not have a rail to hold on to so instructed him how to go up backwards using walker. VCs safety, technique, sequence. Assist to stabilize pt and walker.  Wheelchair Mobility    Modified Rankin (Stroke Patients Only)       Balance                                    Cognition Arousal/Alertness: Awake/alert Behavior During Therapy: WFL for tasks assessed/performed Overall Cognitive Status: Within Functional Limits for tasks assessed                      Exercises Total Joint Exercises Hip ABduction/ADduction: AROM;Left;10 reps;Standing Knee Flexion: AROM;Left;10 reps;Standing Marching in Standing: AROM;Both;10 reps;Standing General Exercises - Lower Extremity Heel Raises: AROM;Both;10 reps;Standing    General Comments        Pertinent Vitals/Pain Pain Assessment: 0-10 Pain Score: 6  Pain Location: L hip/thigh Pain Descriptors / Indicators: Sore;Aching;Tightness Pain Intervention(s): Monitored during session;Repositioned;Ice applied    Home Living                      Prior Function            PT Goals (current goals can now be found in the care plan section) Progress towards PT goals: Progressing toward goals    Frequency    7X/week  PT Plan Current plan remains appropriate    Co-evaluation             End of Session Equipment Utilized During Treatment: Gait belt Activity Tolerance: Patient tolerated treatment well Patient left: in bed;with call bell/phone within reach     Time: 0916-0938 PT Time Calculation (min) (ACUTE ONLY): 22 min  Charges:  $Gait Training: 8-22 mins                    G Codes:      Weston Anna, MPT Pager: [phone]

## 2016-10-07 NOTE — Clinical Social Work Note (Signed)
Clinical Social Work Assessment  Patient Details  Name: [name] [name]. MRN: [id number] Date of Birth: 1961-12-05  Date of referral:  10/07/16               Reason for consult:  Facility Placement                Permission sought to share information with:  Chartered certified accountant granted to share information::  Yes, Verbal Permission Granted  Name::        Agency::     Relationship::     Contact Information:     Housing/Transportation Living arrangements for the past 2 months:  Single Family Home Source of Information:  Patient Patient Interpreter Needed:  None Criminal Activity/Legal Involvement Pertinent to Current Situation/Hospitalization:  No - Comment as needed Significant Relationships:  None Lives with:  Self Do you feel safe going back to the place where you live?  No Need for family participation in patient care:  No (Coment)  Care giving concerns:  CSW received consult for SNF placement.    Social Worker assessment / plan:  CSW spoke with patient who is agreeable with plan for SNF.   Employment status:  Disabled (Comment on whether or not currently receiving Disability) Insurance information:  Medicare, Medicaid In [place] PT Recommendations:  [place] / Referral to community resources:  [place]  Patient/Family's Response to care:  Patient states that he's been to [facility] in the past, but hasn't been to SNF before. Patient requesting [facility] as it would be most convenient for family to come & visit.   Patient/Family's Understanding of and Emotional Response to Diagnosis, Current Treatment, and Prognosis:  Patient was complaining of stomach pains.   Emotional Assessment Appearance:  Appears stated age Attitude/Demeanor/Rapport:    Affect (typically observed):    Orientation:  Oriented to Self, Oriented to Place, Oriented to  Time, Oriented to Situation Alcohol / Substance use:    Psych  involvement (Current and /or in the community):     Discharge Needs  Concerns to be addressed:    Readmission within the last 30 days:    Current discharge risk:    Barriers to Discharge:      Standley Brooking, LCSW 10/07/2016, 1:01 PM

## 2016-10-07 NOTE — Progress Notes (Signed)
Subjective: 3 Days Post-Op Procedure(s) (LRB): LEFT TOTAL HIP ARTHROPLASTY ANTERIOR APPROACH (Left) Patient reports pain as moderate left knee.  Upset he does not qualify for SNF placement.   Objective: Vital signs in last 24 hours: Temp:  [97.6 F (36.4 C)-98 F (36.7 C)] 97.9 F (36.6 C) (10/16 0517) Pulse Rate:  [72-74] 72 (10/16 0517) Resp:  [16-72] 72 (10/16 0517) BP: (111-132)/(67-77) 111/75 (10/16 0517) SpO2:  [98 %-99 %] 99 % (10/16 0517)  Intake/Output from previous day: 10/15 0701 - 10/16 0700 In: 1200 [P.O.:1200] Out: 1790 [Urine:1790] Intake/Output this shift: No intake/output data recorded.   Recent Labs  10/04/16 1127 10/05/16 0507  HGB 13.5 11.9*    Recent Labs  10/04/16 1127 10/05/16 0507  WBC 5.3 5.1  RBC 4.48 3.96*  HCT 40.4 36.0*  PLT 326 260    Recent Labs  10/04/16 1127 10/05/16 0507  NA 138 139  K 3.7 3.6  CL 104 104  CO2 27 30  BUN 13 9  CREATININE 0.91 0.76  GLUCOSE 92 100*  CALCIUM 9.1 8.6*   No results for input(s): LABPT, INR in the last 72 hours.   Left leg: Sensation intact distally Intact pulses distally Dorsiflexion/Plantar flexion intact Incision: dressing C/D/I Compartment soft Left knee tenderness  Assessment/Plan: 3 Days Post-Op Procedure(s) (LRB): LEFT TOTAL HIP ARTHROPLASTY ANTERIOR APPROACH (Left) Up with therapy  Needs to work on steps Patient wants to speak with social worker about SNF Left knee pain due to postioning in surgery and is normal to have status post anterior hip replacement    GILBERT CLARK 10/07/2016, 8:02 AM

## 2016-10-07 NOTE — Clinical Social Work Placement (Signed)
Patient has a bed at [facility]. CSW has completed FL2 & will continue to follow and assist with discharge when ready.    Raynaldo Opitz, LCSW [facility] Clinical Social Worker cell #: [id number]     CLINICAL SOCIAL WORK PLACEMENT  NOTE  Date:  10/07/2016  Patient Details  Name: [name] [name]. MRN: [id number] Date of Birth: 1961-11-15  Clinical Social Work is seeking post-discharge placement for this patient at the [place] level of care (*CSW will initial, date and re-position this form in  chart as items are completed):  Yes   Patient/family provided with [place] Work Department's list of facilities offering this level of care within the geographic area requested by the patient (or if unable, by the patient's family).  Yes   Patient/family informed of their freedom to choose among providers that offer the needed level of care, that participate in Medicare, Medicaid or managed care program needed by the patient, have an available bed and are willing to accept the patient.  Yes   Patient/family informed of [place]'s ownership interest in [facility] and [facility], as well as of the fact that they are under no obligation to receive care at these facilities.  PASRR submitted to EDS on 10/07/16     PASRR number received on 10/07/16     Existing PASRR number confirmed on       FL2 transmitted to all facilities in geographic area requested by pt/family on 10/07/16     FL2 transmitted to all facilities within larger geographic area on       Patient informed that his/her managed care company has contracts with or will negotiate with certain facilities, including the following:        Yes   Patient/family informed of bed offers received.  Patient chooses bed at [place] recommends and patient chooses bed at      Patient to be transferred to [facility] on   .  Patient to be transferred to facility by       Patient family notified on   of transfer.  Name of family member notified:        PHYSICIAN       Additional Comment:    _______________________________________________ Standley Brooking, LCSW 10/07/2016, 1:03 PM

## 2016-10-07 NOTE — Progress Notes (Signed)
Occupational Therapy Evaluation Patient Details Name: [name] [name]. MRN: [id number] DOB: 1961-06-11 Today's Date: 10/07/2016    History of Present Illness 55 yo male s/p L THa-direct anterior 10/04/16. hx of DM, polysubstance abuse   Clinical Impression   Patient presents to OT with decreased ADL independence and safety due to the deficits listed below. He will benefit from skilled OT to maximize function and to facilitate discharge to the venue listed below. OT will follow. Recommend SNF rehab at discharge.    Follow Up Recommendations  SNF;Supervision/Assistance - 24 hour    Equipment Recommendations  3 in 1 bedside comode    Recommendations for Other Services       Precautions / Restrictions Precautions Precautions: Fall Restrictions Weight Bearing Restrictions: No LLE Weight Bearing: Weight bearing as tolerated      Mobility Bed Mobility Overal bed mobility: Needs Assistance Bed Mobility: Supine to Sit;Sit to Supine     Supine to sit: Min guard Sit to supine: Min assist   General bed mobility comments: Assist for L LE onto bed.   Transfers Overall transfer level: Needs assistance Equipment used: Rolling walker (2 wheeled) Transfers: Sit to/from Stand Sit to Stand: Min guard         General transfer comment: close guard for safety. VCs safety, hand/LE placement    Balance                                            ADL Overall ADL's : Needs assistance/impaired Eating/Feeding: Independent   Grooming: Wash/dry hands;Min guard;Standing           Upper Body Dressing : Set up;Sitting   Lower Body Dressing: Moderate assistance;Sit to/from stand   Toilet Transfer: Min guard;Ambulation;BSC;RW   Toileting- Water quality scientist and Hygiene: Min guard;Sit to/from Nurse, children's Details (indicate cue type and reason): unable to lift LLE high enough to clear side of tub Functional mobility during ADLs: Min  guard;Minimal assistance;Rolling walker       Vision     Perception     Praxis      Pertinent Vitals/Pain Pain Assessment: 0-10 Pain Score: 5  Pain Location: L hip/thigh Pain Descriptors / Indicators: Aching;Sore Pain Intervention(s): Monitored during session;Repositioned;Ice applied;RN gave pain meds during session     Hand Dominance     Extremity/Trunk Assessment Upper Extremity Assessment Upper Extremity Assessment: Overall WFL for tasks assessed   Lower Extremity Assessment Lower Extremity Assessment: Defer to PT evaluation   Cervical / Trunk Assessment Cervical / Trunk Assessment: Normal   Communication Communication Communication: No difficulties   Cognition Arousal/Alertness: Awake/alert Behavior During Therapy: WFL for tasks assessed/performed Overall Cognitive Status: Within Functional Limits for tasks assessed                     General Comments       Exercises       Shoulder Instructions      Home Living Family/patient expects to be discharged to:: Unsure Living Arrangements: Other relatives Available Help at Discharge: Family;Other (Comment) (family unable to physically assist patient) Type of Home: House             Bathroom Shower/Tub: Teacher,  years/pre: Standard     Home Equipment: Cane - single point          Prior Functioning/Environment Level  of Independence: Independent        Comments: reports he is basically "homeless," lives with cousin who cannot provide physical assistance        OT Problem List: Decreased strength;Decreased range of motion;Decreased activity tolerance;Decreased knowledge of use of DME or AE;Pain   OT Treatment/Interventions: Self-care/ADL training;DME and/or AE instruction;Therapeutic activities;Patient/family education    OT Goals(Current goals can be found in the care plan section) Acute Rehab OT Goals Patient Stated Goal: regain independence. less pain. OT Goal  Formulation: With patient Time For Goal Achievement: 10/21/16 Potential to Achieve Goals: Good ADL Goals Pt Will Perform Lower Body Bathing: with supervision;sit to/from stand Pt Will Perform Lower Body Dressing: with supervision;sit to/from stand Pt Will Transfer to Toilet: with supervision;ambulating;bedside commode Pt Will Perform Toileting - Clothing Manipulation and hygiene: with supervision;sit to/from stand  OT Frequency: Min 2X/week   Barriers to D/C: Decreased caregiver support          Co-evaluation              End of Session Equipment Utilized During Treatment: Rolling walker Nurse Communication: Mobility status  Activity Tolerance: Patient tolerated treatment well Patient left: in bed;with call bell/phone within reach;with nursing/sitter in room   Time: 1143-1157 OT Time Calculation (min): 14 min Charges:  OT General Charges $OT Visit: 1 Procedure OT Evaluation $OT Eval Low Complexity: 1 Procedure G-Codes:    Archie Shea A 18-Oct-2016, 2:08 PM

## 2016-10-08 LAB — GLUCOSE, CAPILLARY: Glucose-Capillary: 89 mg/dL (ref 65–99)

## 2016-10-08 MED ORDER — BISACODYL 10 MG RE SUPP
10.0000 mg | Freq: Once | RECTAL | Status: AC
  Administered 2016-10-08: 10 mg via RECTAL
  Filled 2016-10-08: qty 1

## 2016-10-08 MED ORDER — LORATADINE 10 MG PO TABS
10.0000 mg | ORAL_TABLET | Freq: Once | ORAL | Status: AC
Start: 2016-10-08 — End: 2016-10-08
  Administered 2016-10-08: 10 mg via ORAL
  Filled 2016-10-08: qty 1

## 2016-10-08 MED ORDER — FLUTICASONE PROPIONATE 50 MCG/ACT NA SUSP
2.0000 | Freq: Once | NASAL | Status: AC
  Administered 2016-10-08: 2 via NASAL
  Filled 2016-10-08: qty 16

## 2016-10-08 MED ORDER — LORAZEPAM 0.5 MG PO TABS
0.5000 mg | ORAL_TABLET | Freq: Once | ORAL | Status: AC
  Administered 2016-10-08: 0.5 mg via ORAL
  Filled 2016-10-08: qty 1

## 2016-10-08 NOTE — Care Management Important Message (Signed)
Important Message  Patient Details  Name: [name] [name]. MRN: [id number] Date of Birth: Jun 02, 1961   Medicare Important Message Given:  Yes    [name] 10/08/2016, 12:37 [place] Message  Patient Details  Name: [name] [name]. MRN: [id number] Date of Birth: 09-09-1961   Medicare Important Message Given:  Yes    [name] 10/08/2016, 12:35 PM

## 2016-10-08 NOTE — Clinical Social Work Placement (Signed)
   CLINICAL SOCIAL WORK PLACEMENT  NOTE  Date:  10/08/2016  Patient Details  Name: [name] [name]. MRN: [id number] Date of Birth: Oct 17, 1961  Clinical Social Work is seeking post-discharge placement for this patient at the [place] level of care (*CSW will initial, date and re-position this form in  chart as items are completed):  Yes   Patient/family provided with [place] Work Department's list of facilities offering this level of care within the geographic area requested by the patient (or if unable, by the patient's family).  Yes   Patient/family informed of their freedom to choose among providers that offer the needed level of care, that participate in Medicare, Medicaid or managed care program needed by the patient, have an available bed and are willing to accept the patient.  Yes   Patient/family informed of [facility]'s ownership interest in [facility] and [facility], as well as of the fact that they are under no obligation to receive care at these facilities.  PASRR submitted to EDS on 10/07/16     PASRR number received on 10/07/16     Existing PASRR number confirmed on       FL2 transmitted to all facilities in geographic area requested by pt/family on 10/07/16     FL2 transmitted to all facilities within larger geographic area on       Patient informed that his/her managed care company has contracts with or will negotiate with certain facilities, including the following:        Yes   Patient/family informed of bed offers received.  [facility] chooses bed at [facility] recommends and patient chooses bed at      Patient to be transferred to [facility] on 10/08/16.  Patient to be transferred to facility by [place]     Patient family notified on 10/08/16 of transfer.  Name of family member notified:  Niece     PHYSICIAN       Additional Comment: Pt / family are in agreement with  d/c to [facility] today. Pt requested to transport by car. D/C Summary sent to SNF for review. Scripts included in d/c packet. # for report provided to nsg. D/C Packet provided to pt.   _______________________________________________ Luretha Rued, LCSW  [phone] 10/08/2016, 3:21 PM

## 2016-10-08 NOTE — Progress Notes (Signed)
Patient ID: [name], male   DOB: 11-27-1961, 55 y.o.   MRN: [id number] Nyoka Lint well.  Can be discharged today.

## 2016-10-08 NOTE — Progress Notes (Signed)
Occupational Therapy Treatment Patient Details Name: [name] [name]. MRN: [id number] DOB: 02/23/1961 Today's Date: 10/08/2016    History of present illness 55 yo male s/p L THa-direct anterior 10/04/16. hx of DM, polysubstance abuse   OT comments  Progressing well towards OT goals. Continue OT per plan of care.  Follow Up Recommendations  SNF    Equipment Recommendations  3 in 1 bedside comode    Recommendations for Other Services      Precautions / Restrictions Precautions Precautions: Fall Restrictions Weight Bearing Restrictions: No LLE Weight Bearing: Weight bearing as tolerated       Mobility Bed Mobility Overal bed mobility: Needs Assistance Bed Mobility: Supine to Sit;Sit to Supine     Supine to sit: Supervision Sit to supine: Supervision      Transfers Overall transfer level: Needs assistance Equipment used: Rolling walker (2 wheeled) Transfers: Sit to/from Stand Sit to Stand: Supervision              Balance                                   ADL Overall ADL's : Needs assistance/impaired     Grooming: Wash/dry hands;Wash/dry face;Applying deodorant;Supervision/safety;Standing           Upper Body Dressing : Set up;Sitting   Lower Body Dressing: Supervision/safety;Sit to/from stand   Toilet Transfer: Supervision/safety;Ambulation;BSC;RW   Toileting- Clothing Manipulation and Hygiene: Supervision/safety;Sit to/from stand       Functional mobility during ADLs: Supervision/safety;Rolling walker        Vision                     Perception     Praxis      Cognition   Behavior During Therapy: WFL for tasks assessed/performed Overall Cognitive Status: Within Functional Limits for tasks assessed                       Extremity/Trunk Assessment               Exercises     Shoulder Instructions       General Comments      Pertinent Vitals/ Pain       Pain Assessment: 0-10 Pain  Score: 4  Pain Location: L hip/thigh Pain Descriptors / Indicators: Aching;Sore Pain Intervention(s): Monitored during session;Repositioned  Home Living                                          Prior Functioning/Environment              Frequency  Min 2X/week        Progress Toward Goals  OT Goals(current goals can now be found in the care plan section)  Progress towards OT goals: Progressing toward goals  Acute Rehab OT Goals Patient Stated Goal: regain independence. less pain.  Plan Discharge plan remains appropriate    Co-evaluation                 End of Session Equipment Utilized During Treatment: Rolling walker   Activity Tolerance Patient tolerated treatment well   Patient Left in bed;with call bell/phone within reach   Nurse Communication Mobility status        Time: [id number] OT Time Calculation (min): 17 min  Charges:  OT General Charges $OT Visit: 1 Procedure OT Treatments $Self Care/Home Management : 8-22 mins  Harleen Fineberg A 10/08/2016, 12:59 PM

## 2016-10-08 NOTE — Discharge Summary (Signed)
Patient ID: [name] [name]. MRN: [id number] DOB/AGE: 1961-07-23 55 y.o.  Admit date: 10/04/2016 Discharge date: 10/08/2016  Admission Diagnoses:  Principal Problem:   Avascular necrosis of hip, left [facility]) Active Problems:   Status post left hip replacement   Discharge Diagnoses:  Same  Past Medical History:  Diagnosis Date  . Acid reflux   . Alcohol abuse    6-8 cans of beer daily  . Arthritis   . Bipolar disorder ([place])   . Bronchitis   . Chronic back pain   . Chronic neck pain   . Chronic pain   . Diabetes mellitus without complication ([place])   . Fracture of lower leg   . Gout   . Hyperlipidemia   . Hypertension   . Left arm pain    chronic  . Pancreatitis    March 2013  . Pseudocyst of pancreas 02/28/2012    Surgeries: Procedure(s): LEFT TOTAL HIP ARTHROPLASTY ANTERIOR APPROACH on 10/04/2016   Consultants:   Discharged Condition: Improved  Hospital Course: [name] [name]. is an 55 y.o. male who was admitted 10/04/2016 for operative treatment ofAvascular necrosis of hip, left ([place]). Patient has severe unremitting pain that affects sleep, daily activities, and work/hobbies. After pre-op clearance the patient was taken to the operating room on 10/04/2016 and underwent  Procedure(s): LEFT TOTAL HIP ARTHROPLASTY ANTERIOR APPROACH.    Patient was given perioperative antibiotics: Anti-infectives    Start     Dose/Rate Route Frequency Ordered Stop   10/04/16 1800  clindamycin (CLEOCIN) IVPB 600 mg     600 mg 100 mL/hr over 30 Minutes Intravenous Every 6 hours 10/04/16 1536 10/05/16 0005   10/04/16 0948  clindamycin (CLEOCIN) IVPB 900 mg     900 mg 100 mL/hr over 30 Minutes Intravenous On call to O.R. 10/04/16 0948 10/04/16 1240       Patient was given sequential compression devices, early ambulation, and chemoprophylaxis to prevent DVT.  Patient benefited maximally from hospital stay and there were no complications.    Recent vital signs: Patient Vitals for  the past 24 hrs:  BP Temp Temp src Pulse Resp SpO2  10/08/16 0525 125/80 97.8 F (36.6 C) Oral 70 16 99 %  10/07/16 2208 113/74 97.7 F (36.5 C) Oral 63 18 96 %  10/07/16 1429 127/83 98.2 F (36.8 C) Oral 77 18 97 %     Recent laboratory studies: No results for input(s): WBC, HGB, HCT, PLT, NA, K, CL, CO2, BUN, CREATININE, GLUCOSE, INR, CALCIUM in the last 72 hours.  Invalid input(s): PT, 2   Discharge Medications:     Medication List    TAKE these medications   acetaminophen 650 MG CR tablet Commonly known as:  TYLENOL Take 650 mg by mouth 2 (two) times daily.   amLODipine 5 MG tablet Commonly known as:  NORVASC Take 1 tablet (5 mg total) by mouth daily. For high blood pressure control   aspirin 325 MG EC tablet Take 1 tablet (325 mg total) by mouth 2 (two) times daily after a meal.   gemfibrozil 600 MG tablet Commonly known as:  LOPID Take 1 tablet (600 mg total) by mouth 2 (two) times daily before a meal. Cholesterol control   lisinopril 5 MG tablet Commonly known as:  PRINIVIL,ZESTRIL Take 5 mg by mouth daily.   metFORMIN 1000 MG tablet Commonly known as:  GLUCOPHAGE Take 1,000 mg by mouth daily with breakfast.   methocarbamol 500 MG tablet Commonly known as:  ROBAXIN Take 1  tablet (500 mg total) by mouth 2 (two) times daily.   multivitamin with minerals Tabs tablet Take 1 tablet by mouth daily.   naproxen 500 MG tablet Commonly known as:  NAPROSYN Take 1 tablet (500 mg total) by mouth 2 (two) times daily.   omeprazole 20 MG capsule Commonly known as:  PRILOSEC Take 1 capsule (20 mg total) by mouth daily. For acid reflux   oxyCODONE 5 MG immediate release tablet Commonly known as:  Oxy IR/ROXICODONE Take 1-2 tablets (5-10 mg total) by mouth every 4 (four) hours as needed for severe pain. 1 tablet 3 times daily, 2 tablets at bedtime What changed:  how much to take  when to take this  reasons to take this   tamsulosin 0.4 MG Caps  capsule Commonly known as:  FLOMAX Take 1 capsule (0.4 mg total) by mouth daily. For enlarged prostate   traMADol 50 MG tablet Commonly known as:  ULTRAM Take 1 tablet (50 mg total) by mouth every 6 (six) hours as needed.       Diagnostic Studies: Dg C-arm 61-120 Min-no Report  Result Date: 10/04/2016 CLINICAL DATA: hip C-ARM 61-120 MINUTES Fluoroscopy was utilized by the requesting physician.  No radiographic interpretation.   Dg Hip Port Unilat With Pelvis 1v Left  Result Date: 10/04/2016 CLINICAL DATA:  Total left hip arthroplasty EXAM: DG HIP (WITH OR WITHOUT PELVIS) 1V PORT LEFT COMPARISON:  Fluoroscopy from earlier today FINDINGS: Total left hip arthroplasty without periprosthetic fracture or dislocation. IMPRESSION: No unexpected finding after left hip arthroplasty. Electronically Signed   By: Monte Fantasia M.D.   On: 10/04/2016 15:01   Dg Hip Operative Unilat With Pelvis Left  Result Date: 10/04/2016 CLINICAL DATA:  Left total hip replacement. EXAM: OPERATIVE left HIP (WITH PELVIS IF PERFORMED)2 VIEWS TECHNIQUE: Fluoroscopic spot image(s) were submitted for interpretation post-operatively. COMPARISON:  06/12/2016. FINDINGS: 2 intraoperative spot fluoro film show the patient be status post left total hip replacement. No evidence for immediate hardware complications. IMPRESSION: Intraoperative assessment during left total hip replacement. Electronically Signed   By: Misty Stanley M.D.   On: 10/04/2016 14:25    Disposition:  To skilled nursing  Discharge Instructions    Call MD / Call 911    Complete by:  As directed    If you experience chest pain or shortness of breath, CALL 911 and be transported to the hospital emergency room.  If you develope a fever above 101 F, pus (white drainage) or increased drainage or redness at the wound, or calf pain, call your surgeon's office.   Constipation Prevention    Complete by:  As directed    Drink plenty of fluids.  Prune juice may  be helpful.  You may use a stool softener, such as Colace (over the counter) 100 mg twice a day.  Use MiraLax (over the counter) for constipation as needed.   Diet - low sodium heart healthy    Complete by:  As directed    Discharge patient    Complete by:  As directed    Increase activity slowly as tolerated    Complete by:  As directed       Follow-up Information    Mcarthur Rossetti, MD. Schedule an appointment as soon as possible for a visit in 2 day(s).   Specialty:  Orthopedic Surgery Contact information: [address] [place] [address] [phone]            Signed: Mcarthur Rossetti 10/08/2016, 6:34  AM    

## 2016-10-09 ENCOUNTER — Encounter: Payer: Self-pay | Admitting: Nurse Practitioner

## 2016-10-09 ENCOUNTER — Non-Acute Institutional Stay (SKILLED_NURSING_FACILITY): Payer: Medicare Other | Admitting: Nurse Practitioner

## 2016-10-09 DIAGNOSIS — E1140 Type 2 diabetes mellitus with diabetic neuropathy, unspecified: Secondary | ICD-10-CM

## 2016-10-09 DIAGNOSIS — N400 Enlarged prostate without lower urinary tract symptoms: Secondary | ICD-10-CM | POA: Diagnosis not present

## 2016-10-09 DIAGNOSIS — M5136 Other intervertebral disc degeneration, lumbar region: Secondary | ICD-10-CM | POA: Diagnosis not present

## 2016-10-09 DIAGNOSIS — K5903 Drug induced constipation: Secondary | ICD-10-CM

## 2016-10-09 DIAGNOSIS — J309 Allergic rhinitis, unspecified: Secondary | ICD-10-CM

## 2016-10-09 DIAGNOSIS — M87052 Idiopathic aseptic necrosis of left femur: Secondary | ICD-10-CM

## 2016-10-09 NOTE — Progress Notes (Signed)
Nursing Home Location: [facility]  Place of Service: SNF (31)  PCP: [name] Hampshire, MD  Allergies  Allergen Reactions  . Penicillins Itching    Has patient had a PCN reaction causing immediate rash, facial/tongue/throat swelling, SOB or lightheadedness with hypotension: No Has patient had a PCN reaction causing severe rash involving mucus membranes or skin necrosis: No Has patient had a PCN reaction that required hospitalization No Has patient had a PCN reaction occurring within the last 10 years: No If all of the above answers are "NO", then may proceed with Cephalosporin use.     Chief Complaint  Patient presents with  . Hospitalization Follow-up    [name] stay from 10/04/16 to 10/08/16.     HPI:  Patient is a 55 y.o. male seen today at [facility] for follow up hospitalization.  Pt was at [facility] long for operative treatment of Avascular necrosis of hip left . Patient was taken to the operating room on 10/04/2016 and underwent left total hip arthroplasty. Pt reports pain is unrelieved by OxyIR 5 mg, reports he has been on 15 mg since 2015. Pain effecting therapy. Increase muscle spasms at night.  Reports constipation, was taking medication in hospital but not currently on medication.   Review of Systems:  Review of Systems  Constitutional: Negative for activity change, appetite change, fatigue and unexpected weight change.  HENT: Positive for congestion.   Eyes: Negative.   Respiratory: Negative for cough and shortness of breath.   Cardiovascular: Negative for chest pain, palpitations and leg swelling.  Gastrointestinal: Positive for constipation. Negative for abdominal pain and diarrhea.  Genitourinary: Negative for difficulty urinating and dysuria.  Musculoskeletal: Positive for arthralgias and myalgias.  Skin: Negative for color change and wound.  Allergic/Immunologic: Positive for environmental allergies.  Neurological: Positive for headaches  (uses tylenol which has been effective). Negative for dizziness and weakness.  Psychiatric/Behavioral: Negative for agitation, behavioral problems and confusion.    Past Medical History:  Diagnosis Date  . Acid reflux   . Alcohol abuse    6-8 cans of beer daily  . Arthritis   . Bipolar disorder ([place])   . Bronchitis   . Chronic back pain   . Chronic neck pain   . Chronic pain   . Diabetes mellitus without complication ([place])   . Fracture of lower leg   . Gout   . Hyperlipidemia   . Hypertension   . Left arm pain    chronic  . Pancreatitis    March 2013  . Pseudocyst of pancreas 02/28/2012   Past Surgical History:  Procedure Laterality Date  . CIRCUMCISION  03/17/2012   Procedure: CIRCUMCISION ADULT;  Surgeon: Marissa Nestle, MD;  Location: [facility];  Service: Urology;  Laterality: N/A;  . COLONOSCOPY WITH PROPOFOL  12/24/2012   [id number] lesion as described above status post biopsy; otherwise normal rectum/Sigmoid diverticulosis/Sigmoid polyps -- resected as described above. anal lesion, benign. colon polyp, hyperplastic  . ESOPHAGOGASTRODUODENOSCOPY (EGD) WITH PROPOFOL  12/24/2012   [id number] erythema and erosions of uncertain significance status post gastric biopsy (reactive gastropathy NO h.pylori)  . NOSE SURGERY     broken nose  . TOTAL HIP ARTHROPLASTY Left 10/04/2016   Procedure: LEFT TOTAL HIP ARTHROPLASTY ANTERIOR APPROACH;  Surgeon: Mcarthur Rossetti, MD;  Location: [facility];  Service: Orthopedics;  Laterality: Left;   Social History:   reports that he has been smoking Cigarettes.  He has a 12.50 pack-year smoking history. He has quit  using smokeless tobacco. His smokeless tobacco use included Chew. He reports that he drinks alcohol. He reports that he uses drugs, including Cocaine and Marijuana.  Family History  Problem Relation Age of Onset  . Diabetes Father   . Anesthesia problems Neg Hx   . Hypotension Neg Hx   . Malignant hyperthermia Neg Hx   . Pseudochol  deficiency Neg Hx   . Colon cancer Neg Hx     Medications: Patient's Medications  New Prescriptions   No medications on file  Previous Medications   ACETAMINOPHEN (TYLENOL) 650 MG CR TABLET    Take 650 mg by mouth 2 (two) times daily.   AMLODIPINE (NORVASC) 5 MG TABLET    Take 1 tablet (5 mg total) by mouth daily. For high blood pressure control   ASPIRIN EC 325 MG EC TABLET    Take 1 tablet (325 mg total) by mouth 2 (two) times daily after a meal.   GEMFIBROZIL (LOPID) 600 MG TABLET    Take 1 tablet (600 mg total) by mouth 2 (two) times daily before a meal. Cholesterol control   LISINOPRIL (PRINIVIL,ZESTRIL) 5 MG TABLET    Take 5 mg by mouth daily.   METFORMIN (GLUCOPHAGE) 1000 MG TABLET    Take 1,000 mg by mouth daily with breakfast.   METHOCARBAMOL (ROBAXIN) 500 MG TABLET    Take 1 tablet (500 mg total) by mouth 2 (two) times daily.   MULTIPLE VITAMIN (MULTIVITAMIN WITH MINERALS) TABS    Take 1 tablet by mouth daily.   NAPROXEN (NAPROSYN) 500 MG TABLET    Take 1 tablet (500 mg total) by mouth 2 (two) times daily.   OMEPRAZOLE (PRILOSEC) 20 MG CAPSULE    Take 1 capsule (20 mg total) by mouth daily. For acid reflux   OXYCODONE (OXY IR/ROXICODONE) 5 MG IMMEDIATE RELEASE TABLET    Take 5 mg by mouth every 6 (six) hours as needed for severe pain.   TAMSULOSIN (FLOMAX) 0.4 MG CAPS    Take 1 capsule (0.4 mg total) by mouth daily. For enlarged prostate   TRAMADOL (ULTRAM) 50 MG TABLET    Take 1 tablet (50 mg total) by mouth every 6 (six) hours as needed.  Modified Medications   No medications on file  Discontinued Medications   OXYCODONE (OXY IR/ROXICODONE) 5 MG IMMEDIATE RELEASE TABLET    Take 1-2 tablets (5-10 mg total) by mouth every 4 (four) hours as needed for severe pain. 1 tablet 3 times daily, 2 tablets at bedtime     Physical Exam: Vitals:   10/09/16 1004  BP: 129/82  Pulse: 83  Resp: 18  Temp: 97.9 F (36.6 C)  SpO2: 98%  Weight: 203 lb (92.1 kg)  Height: 5\' 10"  (1.778 m)      Physical Exam  Constitutional: He is oriented to person, place, and time. He appears well-developed and well-nourished. No distress.  HENT:  Head: Normocephalic and atraumatic.  Mouth/Throat: Oropharynx is clear and moist. No oropharyngeal exudate.  Eyes: Conjunctivae and EOM are normal. Pupils are equal, round, and reactive to light.  Neck: Normal range of motion. Neck supple.  Cardiovascular: Normal rate, regular rhythm and normal heart sounds.   Pulmonary/Chest: Effort normal and breath sounds normal.  Abdominal: Soft. Bowel sounds are normal.  Musculoskeletal: He exhibits no edema or tenderness.  Neurological: He is alert and oriented to person, place, and time.  Skin: Skin is warm and dry. He is not diaphoretic.  aquacel dressing to left anterior hip  Psychiatric: He has a normal mood and affect.    Labs reviewed: Basic Metabolic Panel:  Recent Labs  10/04/16 1127 10/05/16 0507  NA 138 139  K 3.7 3.6  CL 104 104  CO2 27 30  GLUCOSE 92 100*  BUN 13 9  CREATININE 0.91 0.76  CALCIUM 9.1 8.6*   Liver Function Tests: No results for input(s): AST, ALT, ALKPHOS, BILITOT, PROT, ALBUMIN in the last 8760 hours. No results for input(s): LIPASE, AMYLASE in the last 8760 hours. No results for input(s): AMMONIA in the last 8760 hours. CBC:  Recent Labs  10/04/16 1127 10/05/16 0507  WBC 5.3 5.1  HGB 13.5 11.9*  HCT 40.4 36.0*  MCV 90.2 90.9  PLT 326 260   TSH: No results for input(s): TSH in the last 8760 hours. A1C: Lab Results  Component Value Date   HGBA1C 5.6 06/30/2013   Lipid Panel: No results for input(s): CHOL, HDL, LDLCALC, TRIG, CHOLHDL, LDLDIRECT in the last 8760 hours.  Assessment/Plan 1. Avascular necrosis of hip, left (HCC) S/p left total hip arthroplasty, reports pain is not controlled, pt was taking oxy IR 15 mg q 3 hours since 2015. Reports 5 mg not helping pain enough and he was up all night hurting. Also reports increase muscle spasms at  night Will increase oxy IR to 1-2 tablets every 4 hours as needed for pain, robaxin 1 tablets qhs and may have additional tablet daily as needed. Pt also reports he was taking ultram 100 mg q 6 hours as needed, will increase ultram to 100 mg q 8 if needed for breakthrough pain unrelieved  by oxyIR, pt aware of risk associated with polypharmacy and pain medication. Reports he has been on these medication for many years and he can not tolerate just not taking them due to severe pain. Pt also on naproxen 500 mg BID and tylenol BID  2. Allergic rhinitis, unspecified chronicity, unspecified seasonality, unspecified trigger Reports ongoing allergies, was taking "nasal spray medication" and loratadine 10 mg daily . Has not received medication since at [facility]. Will start flonase 1 spray to bilateral nare BID   3. DDD (degenerative disc disease), lumbar Chronic back pain,to cont oxy IR, naproxen, tylenol and ultram as needed  4. Benign prostatic hyperplasia, unspecified whether lower urinary tract symptoms present Would like to take take flomax in the morning, explained to pt why this is given in the evening and he requested time be changed to am anyways as this is what he has always done.   5. Drug-induced constipation Will start miralax 17 gm daily  6. Type 2 diabetes mellitus with diabetic neuropathy, without long-term current use of insulin (HCC) Cont metformin 1000 mg daily  Total time 35 mins  time greater than 50% of total time spent doing pt education and coordination of care regarding medications and plan of care with pt and staff.    [name] [name]. Harle Battiest  [facility] [phone] 8 am - 5 pm) [phone] (after hours)

## 2016-10-10 ENCOUNTER — Non-Acute Institutional Stay (SKILLED_NURSING_FACILITY): Payer: Medicare Other | Admitting: Internal Medicine

## 2016-10-10 ENCOUNTER — Encounter: Payer: Self-pay | Admitting: Internal Medicine

## 2016-10-10 DIAGNOSIS — Z720 Tobacco use: Secondary | ICD-10-CM | POA: Diagnosis not present

## 2016-10-10 DIAGNOSIS — I10 Essential (primary) hypertension: Secondary | ICD-10-CM | POA: Diagnosis not present

## 2016-10-10 DIAGNOSIS — K2100 Gastro-esophageal reflux disease with esophagitis, without bleeding: Secondary | ICD-10-CM

## 2016-10-10 DIAGNOSIS — G894 Chronic pain syndrome: Secondary | ICD-10-CM

## 2016-10-10 DIAGNOSIS — Z8639 Personal history of other endocrine, nutritional and metabolic disease: Secondary | ICD-10-CM | POA: Insufficient documentation

## 2016-10-10 DIAGNOSIS — M87052 Idiopathic aseptic necrosis of left femur: Secondary | ICD-10-CM

## 2016-10-10 DIAGNOSIS — R9431 Abnormal electrocardiogram [ECG] [EKG]: Secondary | ICD-10-CM

## 2016-10-10 NOTE — Assessment & Plan Note (Addendum)
Anticipated discharge 10/22/16; narcotics will be written only until 11/6

## 2016-10-10 NOTE — Assessment & Plan Note (Signed)
Up to Date will be reviewed to see if the present polypharmacy places him at risk for significant dysrhythmia

## 2016-10-10 NOTE — Patient Instructions (Addendum)
I discussed the risk of gastritis with nonsteroidals I also discussed other ideologic factors such as alcohol, peppermint, milking, and caffeine.  Anger management tools were discussed.

## 2016-10-10 NOTE — Assessment & Plan Note (Signed)
Naproxen contraindicated with this PMH Discussed with patient

## 2016-10-10 NOTE — Assessment & Plan Note (Signed)
Tobacco odor in room & lighter on bedside table suggest he is smoking in the facility

## 2016-10-10 NOTE — Assessment & Plan Note (Signed)
Recheck A1c 

## 2016-10-10 NOTE — Assessment & Plan Note (Signed)
BP controlled; no change in antihypertensive medications  

## 2016-10-10 NOTE — Progress Notes (Signed)
[facility] Room: [address] Un[address] Cobble [address] [place] [address]  Chief Complaint  Patient presents with  . New Admit To SNF    New Admit to [name]   Allergies  Allergen Reactions  . Penicillins Itching    Has patient had a PCN reaction causing immediate rash, facial/tongue/throat swelling, SOB or lightheadedness with hypotension: No Has patient had a PCN reaction causing severe rash involving mucus membranes or skin necrosis: No Has patient had a PCN reaction that required hospitalization No Has patient had a PCN reaction occurring within the last 10 years: No If all of the above answers are "NO", then may proceed with Cephalosporin use.     This is a comprehensive admission note to [facility] performed on this date less than 30 days from date of admission. Included are preadmission medical/surgical history;reconciled medication list; family history; social history and comprehensive review of systems.  Corrections and additions to the records were documented . Comprehensive physical exam was also performed. Additionally a clinical summary was entered for each active diagnosis pertinent to this admission in the Problem List to enhance continuity of care.  PCP: Olen Pel , NP ,[facility] ,[place]  HPI:The patient was hospitalized 10/13-10/17/17 and underwent left total hip arthroplasty 10/13 by Dr. Ninfa Linden because of avascular necrosis. The avascular necrosis was in the context of a history of alcohol abuse.  Past medical and surgical history: He has a history of pancreatitis, hypertension, dyslipidemia, and diabetes. He has chronic pain syndrome for which he takes opioids. Endoscopy in 2014 has revealed erosions. He is on nonsteroidals as well as a PPI. He has a history of active smoking as well as a history of prior cocaine abuse. The last A1c on record was 5.6% 06/30/13. He is on metformin. Glucose is in the hospital  ranged from 92-100; renal function was normal. He also has history of prolonged QT interval. He is on tramadol but not an SSRI. He has a history of bipolar disorder which he states is in remission.  Social history: He is active smoker. He is in a private room which reeks of tobacco odor. He continues to drink despite history of pancreatitis. He was incarcerated for DWI.  Family history: Updated  Review of systems: He describes being easily irritated by others He's had some head congestion without purulence. He has chronic pain in the neck and back for which he sees Ms. Wynetta Emery, NP. He has a pain contract with that individual. He describes localized edema of the left thigh since surgery. He's had decreased frequency of his bowels without frank constipation. He states that Ms. Wynetta Emery did perform labs recently. Fasting blood sugars range 85-115. Low sugar is 79. He has no hypoglycemic spells. He does describe dry mouth and excessive thirst.  Constitutional: No fever,significant weight change, fatigue  Eyes: No redness, discharge, pain, vision change ENT/mouth: No nasal  purulent discharge, earache,change in hearing ,sore throat  Cardiovascular: No chest pain, palpitations,paroxysmal nocturnal dyspnea, claudication,ankle edema  Respiratory: No cough, sputum production,hemoptysis, DOE , significant snoring,apnea   Gastrointestinal: No heartburn,dysphagia,abdominal pain, nausea / vomiting,rectal bleeding, melena Genitourinary: No dysuria,hematuria, pyuria,  incontinence, nocturia Dermatologic: No rash, pruritus, change in appearance of skin Neurologic: No dizziness,headache,syncope, seizures, numbness , tingling Psychiatric: No significant anxiety , depression, insomnia, anorexia Endocrine: No change in hair/skin/ nails,  excessive hunger, excessive urination  Hematologic/lymphatic: No significant bruising, lymphadenopathy,abnormal bleeding Allergy/immunology: No itchy/ watery eyes, significant  sneezing, urticaria, angioedema  Physical  exam:  Pertinent or positive findings: Strong tobacco odor in the room, lighter on bedside table Ptosis greater on the right than the left. Arcus senilis. He is completely edentulous. He has a Engineering geologist. S4 is present. There is dullness to percussion in the right upper quadrant. He laces his conversation with profanity.  General appearance:Adequately nourished; no acute distress , increased work of breathing is present.   Lymphatic: No lymphadenopathy about the head, neck, axilla Eyes: No conjunctival inflammation or lid edema is present. There is no scleral icterus. Ears:  External ear exam shows no significant lesions or deformities.   Nose:  External nasal examination shows no deformity or inflammation. Nasal mucosa are pink and moist without lesions ,exudates Oral exam: lips and gums are healthy appearing.There is no oropharyngeal erythema or exudate . Neck:  No thyromegaly, masses, tenderness noted.    Heart:  Normal rate and regular rhythm. S1 and S2 normal without gallop, murmur, click, rub .  Lungs:Chest clear to auscultation without wheezes, rhonchi,rales , rubs. Abdomen:Bowel sounds are normal. Abdomen is soft and nontender with no organomegaly, hernias,masses. GU: deferred. Extremities:  No cyanosis, clubbing,edema  Neurologic exam : Strength equal  in upper  Balance,Rhomberg,finger to nose testing could not be completed due to clinical state Deep tendon reflexes are equal but 0+ in BLE Skin: Warm & dry w/o tenting. No significant lesions or rash.  See clinical summary under each active problem in the Problem List with associated updated therapeutic plan

## 2016-10-10 NOTE — Assessment & Plan Note (Addendum)
PT/OT pain meds as needed I explained his pain should improve aggressively postop

## 2016-10-14 ENCOUNTER — Telehealth (INDEPENDENT_AMBULATORY_CARE_PROVIDER_SITE_OTHER): Payer: Self-pay | Admitting: Radiology

## 2016-10-14 NOTE — Telephone Encounter (Signed)
Patient called, and said that he has swelling and bruising s/p L THA 10/04/2016, it is "above the knee and moving up"- It got worse over the weekend.  Pls call him [phone], he is staying at [facility].

## 2016-10-16 ENCOUNTER — Encounter: Payer: Self-pay | Admitting: Internal Medicine

## 2016-10-17 ENCOUNTER — Encounter (INDEPENDENT_AMBULATORY_CARE_PROVIDER_SITE_OTHER): Payer: Self-pay | Admitting: Orthopaedic Surgery

## 2016-10-17 ENCOUNTER — Ambulatory Visit (INDEPENDENT_AMBULATORY_CARE_PROVIDER_SITE_OTHER): Payer: Medicare Other | Admitting: Orthopaedic Surgery

## 2016-10-17 DIAGNOSIS — Z96642 Presence of left artificial hip joint: Secondary | ICD-10-CM

## 2016-10-17 NOTE — Progress Notes (Signed)
[name] [name] is doing great postop. He is 2 weeks post a left total hip arthroplasty. He has no significant complaints. He is ambulating with a walker. He still staying in a nurse skilled nursing facility. I assessed his suture line and his incision looks great. I placed new Steri-Strips. He does have bruising around his knee and swelling in his foot but his calf is soft.  At this point he will continue to increase his mobility. They're going to release him from the skilled nursing facility on Wednesday of next week. I will see him back at 1 month in follow-up. No x-rays are needed at that visit.

## 2016-10-23 ENCOUNTER — Non-Acute Institutional Stay (SKILLED_NURSING_FACILITY): Payer: Medicare Other | Admitting: Internal Medicine

## 2016-10-23 ENCOUNTER — Encounter: Payer: Self-pay | Admitting: Internal Medicine

## 2016-10-23 DIAGNOSIS — G894 Chronic pain syndrome: Secondary | ICD-10-CM | POA: Diagnosis not present

## 2016-10-23 DIAGNOSIS — M87052 Idiopathic aseptic necrosis of left femur: Secondary | ICD-10-CM | POA: Diagnosis not present

## 2016-10-23 DIAGNOSIS — Z9189 Other specified personal risk factors, not elsewhere classified: Secondary | ICD-10-CM

## 2016-10-23 NOTE — Progress Notes (Signed)
The patient is being discharged from [facility] on this date by Unice Cobble MD.  PCP: Olen Pel ,NP,[facility] ,[place] [phone]  The medical history in this facility was reviewed and summarized and medical problem list was updated. Time spent and note content is documented as follows.  Summary of [facility] medical records: The patient was hospitalized 10/13-10/17/17 undergoing a left total hip arthroplasty 10/13 by Dr. Ninfa Linden because of avascular necrosis in the context of a history of alcohol abuse. His past history is complex and includes pancreatitis, hypertension, dyslipidemia, and diabetes. He has chronic pain for which he takes opioids. He had gastric erosions at endoscopy in 2014. At the time of admission he was on a PPI but also nonsteroidals. He has a history of cocaine abuse. Last A1c on record was 5.6% in July 2014. He is on metformin. Hospitalized glucoses ranged from 92-100. He also has a history of prolonged QT interval; he is on tramadol but not an SSRI, a combination which would increase the risk. He also has history of bipolar disorder which he states is in remission. He has continued to smoke while at the facility. He informed me that he had been incarcerated for DWI. He participated and PT/OT without issue. He was ambulatory in and around the facility using a rolling walker. He was seen by his orthopedist 10/17/16. The note states he had no significant complaints. Op site revealed no complications. Steri-Strips were placed. Follow-up at one month was recommended. Despite the clinical improvement documented by the orthopedist and here at the SNF, he continued to request and receive oxycodone 5 mg 2 every 4 hours "by the clock" according to the staff. He also was taking tramadol 100 mg every 8 hours on regular basis.  Review of systems: He's been using steroid nasal spray for nasal congestion with good benefit. He's also  been on loratadine. He has been onOmeprazole, he states the non-capsule formulation does not work. He has been on enteric-coated aspirin 325 mg twice a day for migraines. He states the migraines have resolved. He was also on naproxen at the time of admission but this was discontinued because of the history of gastric erosions at endoscopy  Physical exam: Exam was not completed as he became somewhat confrontational as we discussed his pain medications and his 6 month risk of adverse reaction or even respiratory suppression of over 80%. He had minimal interest in discussing this.  I also discussed the triggers for his dyspepsia and reflux; he states he will start taking Tylenol again. I emphasized he should not exceed the package maximum dose because of liver risk.   See clinical summary of Discharge Diagnoses in the Problem List with associated updated therapeutic plan  Discharge instructions were written and discharge instructions provided.He was given oxycodone 5 mg 1-2 every 6 hours as needed, #24 and tramadol 100 one every 8 hours as needed #9. He has an appointment for lab work 11/3 at his PCP's office and an actual face-to-face appointment 11/6. He was told she will prescribing any additional pain medicines on 11/3. This was discussed with her 10/31. The patient actually received 150 oxycodone on 09/12/16. He was instructed to take 1 pill 3 times a day and 1-2 at bedtime. Accounting for his hospitalization and stay in the SNF he should've had at least 35 oxycodone left. He stated he had none because "my pain was so severe I had to take more".His PCP has a controlled substance contract with  him

## 2016-10-23 NOTE — Assessment & Plan Note (Signed)
Discussed with PCP and patient as noted

## 2016-10-23 NOTE — Assessment & Plan Note (Addendum)
Discussed with his PCP 10/22/16, reference for opioid risk calculation tool provided Discussed with patient 10/23/16 Prescription for secundum 5 mg 1-2 every 6 hours as needed #24 and tramadol 100 mg one every 8 hours as needed #9 Any additional pain medications to come from his PCP with whom he has a controlled substance contract

## 2016-10-23 NOTE — Patient Instructions (Signed)
See Current Assessment & Plan under specific Diagnosis 

## 2016-10-23 NOTE — Assessment & Plan Note (Signed)
Seen 10/17/16 and felt to be stable. Steri-Strips applied to op site Follow-up appointment approximately 11/17/16 with orthopedist

## 2016-10-28 ENCOUNTER — Telehealth (INDEPENDENT_AMBULATORY_CARE_PROVIDER_SITE_OTHER): Payer: Self-pay | Admitting: Orthopaedic Surgery

## 2016-10-28 NOTE — Telephone Encounter (Signed)
[name] [name] just wanted our fax number to send [place] PT info

## 2016-10-28 NOTE — Telephone Encounter (Signed)
Nancy left message on machine requesting a call back did not state reason for the call.

## 2016-10-29 ENCOUNTER — Ambulatory Visit (HOSPITAL_COMMUNITY): Payer: Medicare Other | Attending: Orthopaedic Surgery | Admitting: Physical Therapy

## 2016-10-29 DIAGNOSIS — M6281 Muscle weakness (generalized): Secondary | ICD-10-CM | POA: Insufficient documentation

## 2016-10-29 DIAGNOSIS — R29898 Other symptoms and signs involving the musculoskeletal system: Secondary | ICD-10-CM | POA: Insufficient documentation

## 2016-10-29 DIAGNOSIS — M25552 Pain in left hip: Secondary | ICD-10-CM | POA: Diagnosis not present

## 2016-10-29 DIAGNOSIS — R262 Difficulty in walking, not elsewhere classified: Secondary | ICD-10-CM | POA: Diagnosis present

## 2016-10-29 NOTE — Therapy (Signed)
[facility] [address] [place], [place], [address] Phone: [phone]   Fax:  [phone]  Physical Therapy Evaluation  Patient Details  Name: [name] [name]. MRN: [id number] Date of Birth: 10/07/1961 Referring Provider: Jean Rosenthal  Encounter Date: 10/29/2016      PT End of Session - 10/29/16 0846    Visit Number 1   Number of Visits 12   Date for PT Re-Evaluation 11/28/16   Authorization Type medicare   Authorization - Visit Number 1   Authorization - Number of Visits 10   PT Start Time 0815   PT Stop Time [id number]   PT Time Calculation (min) 40 min   Activity Tolerance Patient tolerated treatment well   Behavior During Therapy [facility] for tasks assessed/performed      Past Medical History:  Diagnosis Date  . Acid reflux   . Alcohol abuse    6-8 cans of beer daily; hx of incarceration for DWI  . Arthritis   . Bipolar disorder ([place])   . Bronchitis   . Chronic back pain   . Chronic neck pain   . Chronic pain   . Diabetes mellitus without complication ([place])   . Fracture of lower leg   . Gout   . Hyperlipidemia   . Hypertension   . Left arm pain    chronic  . Pancreatitis    March 2013  . Pseudocyst of pancreas 02/28/2012    Past Surgical History:  Procedure Laterality Date  . CIRCUMCISION  03/17/2012   Procedure: CIRCUMCISION ADULT;  Surgeon: Marissa Nestle, MD;  Location: [facility];  Service: Urology;  Laterality: N/A;  . COLONOSCOPY WITH PROPOFOL  12/24/2012   [id number] lesion as described above status post biopsy; otherwise normal rectum/Sigmoid diverticulosis/Sigmoid polyps -- resected as described above. anal lesion, benign. colon polyp, hyperplastic  . ESOPHAGOGASTRODUODENOSCOPY (EGD) WITH PROPOFOL  12/24/2012   [id number] erythema and erosions of uncertain significance status post gastric biopsy (reactive gastropathy NO h.pylori)  . NOSE SURGERY     broken nose  . TOTAL HIP ARTHROPLASTY Left 10/04/2016    Procedure: LEFT TOTAL HIP ARTHROPLASTY ANTERIOR APPROACH;  Surgeon: Mcarthur Rossetti, MD;  Location: [facility];  Service: Orthopedics;  Laterality: Left;    There were no vitals filed for this visit.       Subjective Assessment - 10/29/16 [id number]    Subjective [name] [name] underwent a left THR on 10/13, He was discharged from SNF on 10/23/2016 and is now being referred to skilled physcial therapy.  [name] [name] states that getting in and out of the tub is still difficult for hiim as well as standing for prolong times and walking up hills.     Pertinent History Summary of [place] medical records: The patient was hospitalized 10/13-10/17/17 undergoing a left total hip arthroplasty 10/13 by Dr. Ninfa Linden because of avascular necrosis.   How long can you sit comfortably? able to sit for 20 minutes with pain in lower back    How long can you stand comfortably? 10 to 15 minutes    How long can you walk comfortably? five minute at a time    Patient Stated Goals To be able to sit, walk and stand longer and to be able to climb steps easier; sleep through the night (wakes 2-3 times throughout the night)    Currently in Pain? Yes   Pain Score 6   hightest 7/10; lowest 4/10    Pain Location Hip  Pain Orientation Left   Pain Descriptors / Indicators Aching;Throbbing;Burning   Pain Type Surgical pain   Aggravating Factors  weight bearing    Pain Relieving Factors stretching leg out    Effect of Pain on Daily Activities increases pain    Multiple Pain Sites Yes   Pain Score 6   Pain Location Back   Pain Orientation Lower   Pain Descriptors / Indicators Aching   Pain Type Chronic pain   Pain Frequency Constant            [facility] PT Assessment - 10/29/16 0001      Assessment   Medical Diagnosis Lt THR   Referring Provider Jean Rosenthal   Onset Date/Surgical Date 10/04/16   Next MD Visit 11/13/2016   Prior Therapy SNF     Precautions   Precautions None     Restrictions    Weight Bearing Restrictions No     Balance Screen   Has the patient fallen in the past 6 months Yes   How many times? 3   Has the patient had a decrease in activity level because of a fear of falling?  Yes   Is the patient reluctant to leave their home because of a fear of falling?  No     Home Environment   Living Environment Private residence   Type of [place] Access Stairs to enter   Entrance Stairs-Number of Steps 2     Prior Function   Level of Independence Independent   Vocation On disability   Leisure fish, hunting      Cognition   Overall Cognitive Status Within Functional Limits for tasks assessed     Observation/Other Assessments   Focus on Therapeutic Outcomes (FOTO)  37     Functional Tests   Functional tests Single leg stance;Sit to Stand     Single Leg Stance   Comments Rt: 4 sec; Lt 3 seconds      Sit to Stand   Comments needed to use hands 18.80     ROM / Strength   AROM / PROM / Strength Strength     Strength   Strength Assessment Site Hip;Knee;Ankle   Right/Left Hip Left   Left Hip Flexion 2+/5   Left Hip Extension 3-/5   Left Hip ABduction 2+/5   Right/Left Knee Left   Left Knee Extension 5/5   Right/Left Ankle Left   Left Ankle Dorsiflexion 5/5     Ambulation/Gait   Ambulation Distance (Feet) 492 Feet  3'   Assistive device None   Gait Pattern Step-through pattern   Gait Comments slight trendelenburg gait                    [facility] Adult PT Treatment/Exercise - 10/29/16 0001      Exercises   Exercises Knee/Hip     Knee/Hip Exercises: Aerobic   Nustep 8' hills 3 level 2      Knee/Hip Exercises: Standing   SLS x 5 reps each     Knee/Hip Exercises: Seated   Sit to Sand 5 reps     Knee/Hip Exercises: Supine   Straight Leg Raises Left;10 reps   Straight Leg Raises Limitations knee bent due to strength      Knee/Hip Exercises: Sidelying   Hip ABduction Strengthening;Left;5 reps     Knee/Hip Exercises:  Prone   Hip Extension Left;10 reps  PT Education - 10/29/16 0846    Education provided Yes   Education Details HEP   Person(s) Educated Patient   Methods Explanation;Verbal cues;Handout   Comprehension Verbalized understanding;Returned demonstration          PT Short Term Goals - 10/29/16 0902      PT SHORT TERM GOAL #1   Title Pt pain to be no greater than a 4/10 to allow pt to only wake one time during the night   Time 2   Period Weeks   Status New     PT SHORT TERM GOAL #2   Title Pt to be able to sit for 60 mintues to be able to enjoy a meal, sit at social settings in comfort    Time 2   Period Weeks   Status New     PT SHORT TERM GOAL #3   Title Pt to be able to stand for 20 minutes in comfort to be able to return to fishing    Time 2   Period Weeks     PT SHORT TERM GOAL #4   Title Pt to be able to walk for 20 minute to be able to complete short shopping trips in comfort   Time 2   Period Weeks   Status New           PT Long Term Goals - 10/29/16 [id number]      PT LONG TERM GOAL #1   Title Pt pain to be at the most a 2/10 to allow pt to sleep throughout the night    Time 4   Period Weeks   Status New     PT LONG TERM GOAL #2   Title Pt to be able to sit for 2 hours to be able to travel in a car    Time 4   Period Weeks   Status New     PT LONG TERM GOAL #3   Title Pt strength in left leg  to be at least a 4/5 in all muscular groups to be able to go up and down steps in a reciprocal manner   Time 4   Period Weeks   Status New     PT LONG TERM GOAL #4   Title Pt to be able to single leg stance for 15 seconds on each LE to reduce risk of falling    Time 4   Period Weeks   Status New     PT LONG TERM GOAL #5   Title Pt to be able to walk for 40 mintues without increased pain with a normal gait pattern to be able to complete shopping needs    Time 4   Period Weeks   Status New               Plan - 10/29/16 0847     Clinical Impression Statement [name] [name] is a 55 yo male who opted to have a Left THR on 10/04/2016.  He was discharged to a skilled nursing facility and is now being referred for outpatient therapy to improve his functional ability.  Examination demonstrates decreased strength, decreased activity tolerance, decreased balance, abnormal gait and increased pain.   [name] [name]  will benefit from skilled physical therapy to address these issues and maximize his functional ability.      Rehab Potential Good   PT Frequency 3x / week   PT Duration 4 weeks   PT Treatment/Interventions ADLs/Self Care Home Management;Gait training;Stair training;Functional mobility  training;Therapeutic activities;Therapeutic exercise;Balance training;Patient/family education;Manual techniques   PT Next Visit Plan Begin rocker board, heel raises, mini squats; forward and lateral step ups    PT Home Exercise Plan SLR, hip abducton, extension, heel raises    Consulted and Agree with Plan of Care Patient      Patient will benefit from skilled therapeutic intervention in order to improve the following deficits and impairments:  Abnormal gait, Decreased activity tolerance, Decreased balance, Decreased strength, Pain  Visit Diagnosis: Pain in left hip - Plan: PT plan of care cert/re-cert  Muscle weakness (generalized) - Plan: PT plan of care cert/re-cert  Difficulty in walking, not elsewhere classified - Plan: PT plan of care cert/re-cert      G-Codes - [id number]    Functional Assessment Tool Used foto   Functional Limitation Mobility: Walking and moving around   Mobility: Walking and Moving Around Current Status [phone]) At least 60 percent but less than 80 percent impaired, limited or restricted   Mobility: Walking and Moving Around Goal Status [phone]) At least 40 percent but less than 60 percent impaired, limited or restricted       Problem List Patient Active Problem List   Diagnosis Date Noted  . History of  diabetes mellitus 10/10/2016  . Avascular necrosis of hip, left ([place]) 10/04/2016  . Status post left hip replacement 10/04/2016  . Rash and nonspecific skin eruption 08/12/2013  . Cholelithiases 08/04/2013  . Subcutaneous nodule 07/21/2013  . Acute alcoholic pancreatitis [id number]  . Syncope 04/11/2013  . Prolonged Q-T interval on ECG 04/11/2013  . Cocaine abuse 03/13/2013    Class: Chronic  . At risk for adverse drug event 02/14/2013  . DDD (degenerative disc disease), lumbar 02/01/2013  . Dyspepsia 12/01/2012  . BPH (benign prostatic hyperplasia) 10/07/2012  . Allergic rhinitis 10/03/2012  . Transaminitis 10/02/2012  . Chronic pain syndrome 10/02/2012  . Essential hypertension, benign 07/01/2012  . Tobacco user 07/01/2012  . Hepatic steatosis 03/06/2012  . ED (erectile dysfunction) 03/06/2012  . Pseudocyst of pancreas 02/28/2012  . Alcohol abuse 02/25/2012  . GERD (gastroesophageal reflux disease) 02/23/2012  . Bipolar 1 disorder ([place]) 02/23/2012  . Hypertriglyceridemia 12/05/2011    Rayetta Humphrey, PT CLT [phone] 10/29/2016, 11:31 AM  [facility] [address] [place], [place], [address] Phone: [phone]   Fax:  [phone]  Name: Hairl Hainsworth. MRN: [id number] Date of Birth: May 14, 1961

## 2016-10-29 NOTE — Patient Instructions (Addendum)
Strengthening: Straight Leg Raise (Phase 1)   Bend knee as needed Tighten muscles on front of left  thigh, then lift leg _15__ inches from surface, keeping knee locked.  Repeat _10___ times per set. Do _1___ sets per session. Do __2__ sessions per day.  http://orth.exer.us/614   Copyright  VHI. All rights reserved.  Strengthening: Hip Abduction (Side-Lying)    Tighten muscles on front of left thigh, then lift leg 12____ inches from surface, keeping knee locked.  Repeat _10___ times per set. Do _1___ sets per session. Do ___2_ sessions per day.  http://orth.exer.us/622   Copyright  VHI. All rights reserved.  Strengthening: Hip Extension (Prone)    Tighten muscles on front of left thigh, then lift leg ___2_ inches from surface, keeping knee locked. Repeat __10__ times per set. Do ___1_ sets per session. Do ___2_ sessions per day.  http://orth.exer.us/620   Copyright  VHI. All rights reserved.  Heel Raise: Bilateral (Standing)   At kitchen counter with on counter Rise on balls of feet. Repeat __10__ times per set. Do _1___ sets per session. Do __2__ sessions per day.  http://orth.exer.us/38   Copyright  VHI. All rights reserved.

## 2016-11-04 ENCOUNTER — Ambulatory Visit (HOSPITAL_COMMUNITY): Payer: Medicare Other | Admitting: Physical Therapy

## 2016-11-04 DIAGNOSIS — M6281 Muscle weakness (generalized): Secondary | ICD-10-CM

## 2016-11-04 DIAGNOSIS — M25552 Pain in left hip: Secondary | ICD-10-CM | POA: Diagnosis not present

## 2016-11-04 DIAGNOSIS — R262 Difficulty in walking, not elsewhere classified: Secondary | ICD-10-CM

## 2016-11-04 NOTE — Therapy (Signed)
[facility] [address] [place], [place], [address] Phone: [phone]   Fax:  [phone]  Physical Therapy Treatment  Patient Details  Name: [name] [name]. MRN: [id number] Date of Birth: 08-Nov-1961 Referring Provider: Jean Rosenthal  Encounter Date: 11/04/2016      PT End of Session - 11/04/16 1557    Visit Number 2   Number of Visits 12   Date for PT Re-Evaluation 11/28/16   Authorization Type medicare   Authorization - Visit Number 2   Authorization - Number of Visits 10   PT Start Time 1519   PT Stop Time 1607   PT Time Calculation (min) 48 min   Activity Tolerance Patient tolerated treatment well   Behavior During Therapy [facility] for tasks assessed/performed      Past Medical History:  Diagnosis Date  . Acid reflux   . Alcohol abuse    6-8 cans of beer daily; hx of incarceration for DWI  . Arthritis   . Bipolar disorder ([place])   . Bronchitis   . Chronic back pain   . Chronic neck pain   . Chronic pain   . Diabetes mellitus without complication ([place])   . Fracture of lower leg   . Gout   . Hyperlipidemia   . Hypertension   . Left arm pain    chronic  . Pancreatitis    March 2013  . Pseudocyst of pancreas 02/28/2012    Past Surgical History:  Procedure Laterality Date  . CIRCUMCISION  03/17/2012   Procedure: CIRCUMCISION ADULT;  Surgeon: Marissa Nestle, MD;  Location: [facility];  Service: Urology;  Laterality: N/A;  . COLONOSCOPY WITH PROPOFOL  12/24/2012   WLS:LHTD lesion as described above status post biopsy; otherwise normal rectum/Sigmoid diverticulosis/Sigmoid polyps -- resected as described above. anal lesion, benign. colon polyp, hyperplastic  . ESOPHAGOGASTRODUODENOSCOPY (EGD) WITH PROPOFOL  12/24/2012   SKA:[id number] erythema and erosions of uncertain significance status post gastric biopsy (reactive gastropathy NO h.pylori)  . NOSE SURGERY     broken nose  . TOTAL HIP ARTHROPLASTY Left 10/04/2016   Procedure: LEFT TOTAL HIP ARTHROPLASTY ANTERIOR APPROACH;  Surgeon: Mcarthur Rossetti, MD;  Location: [facility];  Service: Orthopedics;  Laterality: Left;    There were no vitals filed for this visit.      Subjective Assessment - 11/04/16 1527    Subjective [name] states that he has been doing his exercises and walking more without difficulty.  Pt has been cutting down on his medications.   Pertinent History Summary of [facility] medical records: The patient was hospitalized 10/13-10/17/17 undergoing a left total hip arthroplasty 10/13 by Dr. Ninfa Linden because of avascular necrosis in the context of a history of alcohol abuse.   How long can you sit comfortably? able to sit for 20 minutes with pain in lower back    How long can you stand comfortably? 10 to 15 minutes    How long can you walk comfortably? five minute at a time    Patient Stated Goals To be able to sit, walk and stand longer and to be able to climb steps easier; sleep through the night (wakes 2-3 times throughout the night)    Currently in Pain? Yes   Pain Score 4    Pain Location Hip   Pain Orientation Left   Pain Descriptors / Indicators Aching   Pain Type Surgical pain   Pain Onset 1 to 4 weeks ago   Pain  Frequency Intermittent   Aggravating Factors  walking    Pain Relieving Factors medication    Effect of Pain on Daily Activities increases if to much                         [facility] Adult PT Treatment/Exercise - 11/04/16 0001      Exercises   Exercises Knee/Hip     Knee/Hip Exercises: Stretches   Other Knee/Hip Stretches slant board 1' x 2      Knee/Hip Exercises: Aerobic   Nustep --  10' hills 3 level 4     Knee/Hip Exercises: Standing   Heel Raises Both   Forward Lunges Both;10 reps   Lateral Step Up Left;10 reps;Step Height: 6"   Forward Step Up Left;10 reps;Step Height: 6"   Functional Squat 10 reps   Stairs 2 Rt    Rocker Board 2 minutes   SLS x5 B max RT 14 LT   30 seconds      Knee/Hip Exercises: Seated   Sit to Sand 10 reps     Knee/Hip Exercises: Supine   Straight Leg Raises Left;10 reps     Knee/Hip Exercises: Sidelying   Hip ABduction Strengthening;Left;15 reps     Knee/Hip Exercises: Prone   Hip Extension Both;10 reps                  PT Short Term Goals - 11/04/16 1600      PT SHORT TERM GOAL #1   Title Pt pain to be no greater than a 4/10 to allow pt to only wake one time during the night   Time 2   Period Weeks   Status On-going     PT SHORT TERM GOAL #2   Title Pt to be able to sit for 60 mintues to be able to enjoy a meal, sit at social settings in comfort    Time 2   Period Weeks   Status On-going     PT SHORT TERM GOAL #3   Title Pt to be able to stand for 20 minutes in comfort to be able to return to fishing    Time 2   Period Weeks     PT SHORT TERM GOAL #4   Title Pt to be able to walk for 20 minute to be able to complete short shopping trips in comfort   Time 2   Period Weeks   Status On-going           PT Long Term Goals - 11/04/16 1600      PT LONG TERM GOAL #1   Title Pt pain to be at the most a 2/10 to allow pt to sleep throughout the night    Time 4   Period Weeks   Status On-going     PT LONG TERM GOAL #2   Title Pt to be able to sit for 2 hours to be able to travel in a car    Time 4   Period Weeks   Status On-going     PT LONG TERM GOAL #3   Title Pt strength in left leg  to be at least a 4/5 in all muscular groups to be able to go up and down steps in a reciprocal manner   Time 4   Period Weeks   Status On-going     PT LONG TERM GOAL #4   Title Pt to be able to single leg stance for  15 seconds on each LE to reduce risk of falling    Time 4   Period Weeks   Status Partially Met     PT LONG TERM GOAL #5   Title Pt to be able to walk for 40 mintues without increased pain with a normal gait pattern to be able to complete shopping needs    Time 4   Period Weeks    Status On-going               Plan - 11/04/16 1559    Clinical Impression Statement Pt enters departement with improved gait pattern.  Added functional strengthening and balance exercises to pt regieme with minimal cuing needed for proper technique.     Rehab Potential Good   PT Frequency 3x / week   PT Duration 4 weeks   PT Treatment/Interventions ADLs/Self Care Home Management;Gait training;Stair training;Functional mobility training;Therapeutic activities;Therapeutic exercise;Balance training;Patient/family education;Manual techniques   PT Next Visit Plan begin side stepping with t-band as well as side lunges and higher level balance activity.    PT Home Exercise Plan SLR, hip abducton, extension, heel raises    Consulted and Agree with Plan of Care Patient      Patient will benefit from skilled therapeutic intervention in order to improve the following deficits and impairments:  Abnormal gait, Decreased activity tolerance, Decreased balance, Decreased strength, Pain  Visit Diagnosis: Pain in left hip  Muscle weakness (generalized)  Difficulty in walking, not elsewhere classified     Problem List Patient Active Problem List   Diagnosis Date Noted  . History of diabetes mellitus 10/10/2016  . Avascular necrosis of hip, left ([place]) 10/04/2016  . Status post left hip replacement 10/04/2016  . Rash and nonspecific skin eruption 08/12/2013  . Cholelithiases 08/04/2013  . Subcutaneous nodule 07/21/2013  . Acute alcoholic pancreatitis 16/09/9603  . Syncope 04/11/2013  . Prolonged Q-T interval on ECG 04/11/2013  . Cocaine abuse 03/13/2013    Class: Chronic  . At risk for adverse drug event 02/14/2013  . DDD (degenerative disc disease), lumbar 02/01/2013  . Dyspepsia 12/01/2012  . BPH (benign prostatic hyperplasia) 10/07/2012  . Allergic rhinitis 10/03/2012  . Transaminitis 10/02/2012  . Chronic pain syndrome 10/02/2012  . Essential hypertension, benign 07/01/2012  .  Tobacco user 07/01/2012  . Hepatic steatosis 03/06/2012  . ED (erectile dysfunction) 03/06/2012  . Pseudocyst of pancreas 02/28/2012  . Alcohol abuse 02/25/2012  . GERD (gastroesophageal reflux disease) 02/23/2012  . Bipolar 1 disorder ([place]) 02/23/2012  . Hypertriglyceridemia 12/05/2011    Rayetta Humphrey, PT CLT [phone] 11/04/2016, 4:07 PM  [facility] [address] [place], [place], [address] Phone: [phone]   Fax:  [phone]  Name: Dolan Xia. MRN: [id number] Date of Birth: 1961-04-14

## 2016-11-06 ENCOUNTER — Ambulatory Visit (HOSPITAL_COMMUNITY): Payer: Medicare Other

## 2016-11-06 DIAGNOSIS — R262 Difficulty in walking, not elsewhere classified: Secondary | ICD-10-CM

## 2016-11-06 DIAGNOSIS — M6281 Muscle weakness (generalized): Secondary | ICD-10-CM

## 2016-11-06 DIAGNOSIS — M25552 Pain in left hip: Secondary | ICD-10-CM

## 2016-11-06 NOTE — Therapy (Signed)
[facility] [address] [place], [place], [address] Phone: [phone]   Fax:  [phone]  Physical Therapy Treatment  Patient Details  Name: [name] [name]. MRN: [id number] Date of Birth: [birth year] Referring Provider: Jean Rosenthal  Encounter Date: 11/06/2016      PT End of Session - 11/06/16 1107    Visit Number 3   Number of Visits 12   Date for PT Re-Evaluation 11/28/16   Authorization Type medicare   Authorization - Visit Number 3   Authorization - Number of Visits 10   PT Start Time 7116   PT Stop Time 1115   PT Time Calculation (min) 30 min   Activity Tolerance Patient tolerated treatment well;No increased pain   Behavior During Therapy WFL for tasks assessed/performed      Past Medical History:  Diagnosis Date  . Acid reflux   . Alcohol abuse    6-8 cans of beer daily; hx of incarceration for DWI  . Arthritis   . Bipolar disorder ([place])   . Bronchitis   . Chronic back pain   . Chronic neck pain   . Chronic pain   . Diabetes mellitus without complication ([place])   . Fracture of lower leg   . Gout   . Hyperlipidemia   . Hypertension   . Left arm pain    chronic  . Pancreatitis    March 2013  . Pseudocyst of pancreas 02/28/2012    Past Surgical History:  Procedure Laterality Date  . CIRCUMCISION  03/17/2012   Procedure: CIRCUMCISION ADULT;  Surgeon: Marissa Nestle, MD;  Location: [facility];  Service: Urology;  Laterality: N/A;  . COLONOSCOPY WITH PROPOFOL  12/24/2012   FBX:UXYB lesion as described above status post biopsy; otherwise normal rectum/Sigmoid diverticulosis/Sigmoid polyps -- resected as described above. anal lesion, benign. colon polyp, hyperplastic  . ESOPHAGOGASTRODUODENOSCOPY (EGD) WITH PROPOFOL  12/24/2012   FXO:VANVBTY erythema and erosions of uncertain significance status post gastric biopsy (reactive gastropathy NO h.pylori)  . NOSE SURGERY     broken nose  . TOTAL HIP ARTHROPLASTY Left  10/04/2016   Procedure: LEFT TOTAL HIP ARTHROPLASTY ANTERIOR APPROACH;  Surgeon: Mcarthur Rossetti, MD;  Location: [facility];  Service: Orthopedics;  Laterality: Left;    There were no vitals filed for this visit.      Subjective Assessment - 11/06/16 1058    Subjective Pt doing well today, said his pain was 5/10 upon waking but better after his pain meds. He continues to work on his HEP consistently.    Pertinent History Summary of [facility] medical records: The patient was hospitalized 10/13-10/17/17 undergoing a left total hip arthroplasty 10/13 by Dr. Ninfa Linden because of avascular necrosis in the context of a history of alcohol abuse.   Currently in Pain? No/denies                         [facility] Adult PT Treatment/Exercise - 11/06/16 0001      Ambulation/Gait   Ambulation Distance (Feet) 900 Feet   Assistive device None   Gait velocity 0.63ms   Gait Comments slight trendelenburg gait, improves with distance     Knee/Hip Exercises: Standing   Hip Flexion 2 sets;10 reps;Knee bent;Knee straight;Left;Stengthening  2x10 straight, 2x10 bent   Hip Extension Knee bent;Knee straight;Left;Stengthening;2 sets  2x10 bent, 2x10 straight   Other Standing Knee Exercises Lateral side step: 2x234fbilat, 2x2529fredTB bilat    Other  Standing Knee Exercises Tandem Balance 3x30sec bilat     Knee/Hip Exercises: Seated   Sit to Sand 10 reps;2 sets;without UE support                  PT Short Term Goals - 11/04/16 1600      PT SHORT TERM GOAL #1   Title Pt pain to be no greater than a 4/10 to allow pt to only wake one time during the night   Time 2   Period Weeks   Status On-going     PT SHORT TERM GOAL #2   Title Pt to be able to sit for 60 mintues to be able to enjoy a meal, sit at social settings in comfort    Time 2   Period Weeks   Status On-going     PT SHORT TERM GOAL #3   Title Pt to be able to stand for 20 minutes in comfort to be  able to return to fishing    Time 2   Period Weeks     PT SHORT TERM GOAL #4   Title Pt to be able to walk for 20 minute to be able to complete short shopping trips in comfort   Time 2   Period Weeks   Status On-going           PT Long Term Goals - 11/04/16 1600      PT LONG TERM GOAL #1   Title Pt pain to be at the most a 2/10 to allow pt to sleep throughout the night    Time 4   Period Weeks   Status On-going     PT LONG TERM GOAL #2   Title Pt to be able to sit for 2 hours to be able to travel in a car    Time 4   Period Weeks   Status On-going     PT LONG TERM GOAL #3   Title Pt strength in left leg  to be at least a 4/5 in all muscular groups to be able to go up and down steps in a reciprocal manner   Time 4   Period Weeks   Status On-going     PT LONG TERM GOAL #4   Title Pt to be able to single leg stance for 15 seconds on each LE to reduce risk of falling    Time 4   Period Weeks   Status Partially Met     PT LONG TERM GOAL #5   Title Pt to be able to walk for 40 mintues without increased pain with a normal gait pattern to be able to complete shopping needs    Time 4   Period Weeks   Status On-going               Plan - 11/06/16 1108    Clinical Impression Statement Pt arrived late for session today, misunderstanding regarding appt time. Session with continued activation of Left Hip strength in 3 directions, gait training with cues for form correction, and endurance/balance activity in standing. Good form demonstrated after verbal cues. Mild aggravation of pain. Mild fatigue in glutes noted.    Rehab Potential Good   PT Frequency 3x / week   PT Duration 4 weeks   PT Treatment/Interventions ADLs/Self Care Home Management;Gait training;Stair training;Functional mobility training;Therapeutic activities;Therapeutic exercise;Balance training;Patient/family education;Manual techniques   PT Next Visit Plan Continue side stepping with t-band; add in side  lunges.    PT  Home Exercise Plan Eval: SLR, hip abducton, extension, heel raisesl;   Consulted and Agree with Plan of Care Patient      Patient will benefit from skilled therapeutic intervention in order to improve the following deficits and impairments:  Abnormal gait, Decreased activity tolerance, Decreased balance, Decreased strength, Pain  Visit Diagnosis: Pain in left hip  Muscle weakness (generalized)  Difficulty in walking, not elsewhere classified     Problem List Patient Active Problem List   Diagnosis Date Noted  . History of diabetes mellitus 10/10/2016  . Avascular necrosis of hip, left ([place]) 10/04/2016  . Status post left hip replacement 10/04/2016  . Rash and nonspecific skin eruption 08/12/2013  . Cholelithiases 08/04/2013  . Subcutaneous nodule 07/21/2013  . Acute alcoholic pancreatitis 56/67/1779  . Syncope 04/11/2013  . Prolonged Q-T interval on ECG 04/11/2013  . Cocaine abuse 03/13/2013    Class: Chronic  . At risk for adverse drug event 02/14/2013  . DDD (degenerative disc disease), lumbar 02/01/2013  . Dyspepsia 12/01/2012  . BPH (benign prostatic hyperplasia) 10/07/2012  . Allergic rhinitis 10/03/2012  . Transaminitis 10/02/2012  . Chronic pain syndrome 10/02/2012  . Essential hypertension, benign 07/01/2012  . Tobacco user 07/01/2012  . Hepatic steatosis 03/06/2012  . ED (erectile dysfunction) 03/06/2012  . Pseudocyst of pancreas 02/28/2012  . Alcohol abuse 02/25/2012  . GERD (gastroesophageal reflux disease) 02/23/2012  . Bipolar 1 disorder ([place]) 02/23/2012  . Hypertriglyceridemia 12/05/2011   11:14 AM, 11/06/16 Etta Grandchild, PT, DPT Physical Therapist at [facility] [phone] (office)     [facility] [address] [place], [place], [address] Phone: [phone]   Fax:  [phone]  Name: [name] [name]. MRN: [id number] Date of Birth: [birth year]

## 2016-11-08 ENCOUNTER — Ambulatory Visit (HOSPITAL_COMMUNITY): Payer: Medicare Other | Admitting: Physical Therapy

## 2016-11-08 DIAGNOSIS — M6281 Muscle weakness (generalized): Secondary | ICD-10-CM

## 2016-11-08 DIAGNOSIS — M25552 Pain in left hip: Secondary | ICD-10-CM | POA: Diagnosis not present

## 2016-11-08 DIAGNOSIS — R262 Difficulty in walking, not elsewhere classified: Secondary | ICD-10-CM

## 2016-11-08 NOTE — Therapy (Signed)
[facility] [address] [place], [place], [address] Phone: [phone]   Fax:  [phone]  Physical Therapy Treatment  Patient Details  Name: [name] [name]. MRN: [id number] Date of Birth: 04-02-1961 Referring Provider: Jean Rosenthal  Encounter Date: 11/08/2016      PT End of Session - 11/08/16 1002    Visit Number 4   Number of Visits 12   Date for PT Re-Evaluation 11/28/16   Authorization Type medicare   Authorization - Visit Number 4   Authorization - Number of Visits 10   PT Start Time 4888   PT Stop Time 1030   PT Time Calculation (min) 43 min   Activity Tolerance Patient tolerated treatment well;No increased pain   Behavior During Therapy WFL for tasks assessed/performed      Past Medical History:  Diagnosis Date  . Acid reflux   . Alcohol abuse    6-8 cans of beer daily; hx of incarceration for DWI  . Arthritis   . Bipolar disorder ([place])   . Bronchitis   . Chronic back pain   . Chronic neck pain   . Chronic pain   . Diabetes mellitus without complication ([place])   . Fracture of lower leg   . Gout   . Hyperlipidemia   . Hypertension   . Left arm pain    chronic  . Pancreatitis    March 2013  . Pseudocyst of pancreas 02/28/2012    Past Surgical History:  Procedure Laterality Date  . CIRCUMCISION  03/17/2012   Procedure: CIRCUMCISION ADULT;  Surgeon: Marissa Nestle, MD;  Location: [facility];  Service: Urology;  Laterality: N/A;  . COLONOSCOPY WITH PROPOFOL  12/24/2012   BVQ:XIHW lesion as described above status post biopsy; otherwise normal rectum/Sigmoid diverticulosis/Sigmoid polyps -- resected as described above. anal lesion, benign. colon polyp, hyperplastic  . ESOPHAGOGASTRODUODENOSCOPY (EGD) WITH PROPOFOL  12/24/2012   TUU:[id number] erythema and erosions of uncertain significance status post gastric biopsy (reactive gastropathy NO h.pylori)  . NOSE SURGERY     broken nose  . TOTAL HIP ARTHROPLASTY Left  10/04/2016   Procedure: LEFT TOTAL HIP ARTHROPLASTY ANTERIOR APPROACH;  Surgeon: Mcarthur Rossetti, MD;  Location[facility];  Service: Orthopedics;  Laterality: Left;    There were no vitals filed for this visit.      Subjective Assessment - 11/08/16 0948    Subjective Pt arrives reporting that his hip hasn't really been bothering him lately. He states that the outside of his thigh is still numb from the surgery.   Pertinent History Summary of [facility] medical records: The patient was hospitalized 10/13-10/17/17 undergoing a left total hip arthroplasty 10/13 by Dr. Ninfa Linden because of avascular necrosis in the context of a history of alcohol abuse.   Patient Stated Goals To be able to sit, walk and stand longer and to be able to climb steps easier; sleep through the night (wakes 2-3 times throughout the night)    Currently in Pain? No/denies                         [facility] Adult PT Treatment/Exercise - 11/08/16 0001      Knee/Hip Exercises: Aerobic   Recumbent Bike L5 x4 min at end of session, x4 min      Knee/Hip Exercises: Standing   Heel Raises Right;Left;1 set;20 reps   Heel Raises Limitations single leg    Knee Flexion Left;2 sets;10 reps  Knee Flexion Limitations 5#   Hip Flexion 10 reps;Knee bent;Both;Limitations;3 sets  #3 on LLE   Hip Flexion Limitations foam pad    Hip Extension Both;2 sets;15 reps;Knee straight;Knee bent  x2 sets each    Forward Step Up 2 sets;Left;10 reps;Hand Hold: 0;Step Height: 6"   Other Standing Knee Exercises lateral sidestepping in hallways 2x32 ft with blue TB around ankles    Other Standing Knee Exercises Rt hip hike on 4" step 2x10 reps      Knee/Hip Exercises: Seated   Sit to Sand 2 sets;10 reps;without UE support;Other (comment)  tactile cues to improve weight shift to the Lt                 PT Education - 11/08/16 1001    Education provided Yes   Education Details technique with therex     Person(s) Educated Patient   Methods Explanation;Verbal cues   Comprehension Verbalized understanding;Returned demonstration          PT Short Term Goals - 11/04/16 1600      PT SHORT TERM GOAL #1   Title Pt pain to be no greater than a 4/10 to allow pt to only wake one time during the night   Time 2   Period Weeks   Status On-going     PT SHORT TERM GOAL #2   Title Pt to be able to sit for 60 mintues to be able to enjoy a meal, sit at social settings in comfort    Time 2   Period Weeks   Status On-going     PT SHORT TERM GOAL #3   Title Pt to be able to stand for 20 minutes in comfort to be able to return to fishing    Time 2   Period Weeks     PT SHORT TERM GOAL #4   Title Pt to be able to walk for 20 minute to be able to complete short shopping trips in comfort   Time 2   Period Weeks   Status On-going           PT Long Term Goals - 11/04/16 1600      PT LONG TERM GOAL #1   Title Pt pain to be at the most a 2/10 to allow pt to sleep throughout the night    Time 4   Period Weeks   Status On-going     PT LONG TERM GOAL #2   Title Pt to be able to sit for 2 hours to be able to travel in a car    Time 4   Period Weeks   Status On-going     PT LONG TERM GOAL #3   Title Pt strength in left leg  to be at least a 4/5 in all muscular groups to be able to go up and down steps in a reciprocal manner   Time 4   Period Weeks   Status On-going     PT LONG TERM GOAL #4   Title Pt to be able to single leg stance for 15 seconds on each LE to reduce risk of falling    Time 4   Period Weeks   Status Partially Met     PT LONG TERM GOAL #5   Title Pt to be able to walk for 40 mintues without increased pain with a normal gait pattern to be able to complete shopping needs    Time 4   Period Weeks  Status On-going               Plan - 11/08/16 1003    Clinical Impression Statement Today's session continued with therex to progress functional strength. Pt  demonstrates improved strength and endurance evident by his ability to perform all exercises without significant muscle fatigue/shaking. Ended on recumbent bike per pt request, without increase in pain and encouraged continued adherence to HEP and daily walking.   Rehab Potential Good   PT Frequency 3x / week   PT Duration 4 weeks   PT Treatment/Interventions ADLs/Self Care Home Management;Gait training;Stair training;Functional mobility training;Therapeutic activities;Therapeutic exercise;Balance training;Patient/family education;Manual techniques   PT Next Visit Plan continues to progress functional strength and balance    PT Home Exercise Plan Eval: SLR, hip abducton, extension, heel raises   Consulted and Agree with Plan of Care Patient      Patient will benefit from skilled therapeutic intervention in order to improve the following deficits and impairments:  Abnormal gait, Decreased activity tolerance, Decreased balance, Decreased strength, Pain  Visit Diagnosis: Pain in left hip  Muscle weakness (generalized)  Difficulty in walking, not elsewhere classified     Problem List Patient Active Problem List   Diagnosis Date Noted  . History of diabetes mellitus 10/10/2016  . Avascular necrosis of hip, left ([place]) 10/04/2016  . Status post left hip replacement 10/04/2016  . Rash and nonspecific skin eruption 08/12/2013  . Cholelithiases 08/04/2013  . Subcutaneous nodule 07/21/2013  . Acute alcoholic pancreatitis 84/69/6295  . Syncope 04/11/2013  . Prolonged Q-T interval on ECG 04/11/2013  . Cocaine abuse 03/13/2013    Class: Chronic  . At risk for adverse drug event 02/14/2013  . DDD (degenerative disc disease), lumbar 02/01/2013  . Dyspepsia 12/01/2012  . BPH (benign prostatic hyperplasia) 10/07/2012  . Allergic rhinitis 10/03/2012  . Transaminitis 10/02/2012  . Chronic pain syndrome 10/02/2012  . Essential hypertension, benign 07/01/2012  . Tobacco user 07/01/2012  .  Hepatic steatosis 03/06/2012  . ED (erectile dysfunction) 03/06/2012  . Pseudocyst of pancreas 02/28/2012  . Alcohol abuse 02/25/2012  . GERD (gastroesophageal reflux disease) 02/23/2012  . Bipolar 1 disorder ([place]) 02/23/2012  . Hypertriglyceridemia 12/05/2011    11:04 AM,11/08/16 Elly Modena PT, DPT [facility] [address] [place], [place], [address] Phone: [phone]   Fax:  [phone]  Name: [name] [name]. MRN: [id number] Date of Birth: 1961/02/06

## 2016-11-11 ENCOUNTER — Ambulatory Visit (HOSPITAL_COMMUNITY): Payer: Medicare Other | Admitting: Physical Therapy

## 2016-11-11 DIAGNOSIS — M6281 Muscle weakness (generalized): Secondary | ICD-10-CM

## 2016-11-11 DIAGNOSIS — M25552 Pain in left hip: Secondary | ICD-10-CM

## 2016-11-11 DIAGNOSIS — R262 Difficulty in walking, not elsewhere classified: Secondary | ICD-10-CM

## 2016-11-11 NOTE — Therapy (Signed)
[facility] [address] [place], [place], [address] Phone: [phone]   Fax:  [phone]  Physical Therapy Treatment  Patient Details  Name: [name] [name]. MRN: [id number] Date of Birth: 06/22/1961 Referring Provider: Jean Rosenthal  Encounter Date: 11/11/2016      PT End of Session - 11/11/16 0945    Visit Number 5   Number of Visits 12   Date for PT Re-Evaluation 11/28/16   Authorization Type medicare   Authorization - Visit Number 5   Authorization - Number of Visits 10   PT Start Time 0905   PT Stop Time 0943   PT Time Calculation (min) 38 min   Activity Tolerance Patient tolerated treatment well;No increased pain   Behavior During Therapy WFL for tasks assessed/performed      Past Medical History:  Diagnosis Date  . Acid reflux   . Alcohol abuse    6-8 cans of beer daily; hx of incarceration for DWI  . Arthritis   . Bipolar disorder ([place])   . Bronchitis   . Chronic back pain   . Chronic neck pain   . Chronic pain   . Diabetes mellitus without complication ([place])   . Fracture of lower leg   . Gout   . Hyperlipidemia   . Hypertension   . Left arm pain    chronic  . Pancreatitis    March 2013  . Pseudocyst of pancreas 02/28/2012    Past Surgical History:  Procedure Laterality Date  . CIRCUMCISION  03/17/2012   Procedure: CIRCUMCISION ADULT;  Surgeon: Marissa Nestle, MD;  Location: [facility];  Service: Urology;  Laterality: N/A;  . COLONOSCOPY WITH PROPOFOL  12/24/2012   IBB:CWUG lesion as described above status post biopsy; otherwise normal rectum/Sigmoid diverticulosis/Sigmoid polyps -- resected as described above. anal lesion, benign. colon polyp, hyperplastic  . ESOPHAGOGASTRODUODENOSCOPY (EGD) WITH PROPOFOL  12/24/2012   QBV:[id number] erythema and erosions of uncertain significance status post gastric biopsy (reactive gastropathy NO h.pylori)  . NOSE SURGERY     broken nose  . TOTAL HIP ARTHROPLASTY Left  10/04/2016   Procedure: LEFT TOTAL HIP ARTHROPLASTY ANTERIOR APPROACH;  Surgeon: Mcarthur Rossetti, MD;  Location[facility];  Service: Orthopedics;  Laterality: Left;    There were no vitals filed for this visit.      Subjective Assessment - 11/11/16 0907    Subjective patient arrives today doing well, he states that balance is the hardest thing for him right now, otherwise he is still doing good with no major changes    Pertinent History Summary of [facility] medical records: The patient was hospitalized 10/13-10/17/17 undergoing a left total hip arthroplasty 10/13 by Dr. Ninfa Linden because of avascular necrosis in the context of a history of alcohol abuse.   Currently in Pain? Yes   Pain Score 3    Pain Location Hip   Pain Orientation Left   Pain Descriptors / Indicators Aching   Pain Type Surgical pain   Pain Radiating Towards none    Pain Onset 1 to 4 weeks ago   Pain Frequency Intermittent   Aggravating Factors  laying on hip    Pain Relieving Factors just deals with it, MD cut back meds    Effect of Pain on Daily Activities cannot stand a long time                          [facility] Adult PT  Treatment/Exercise - 11/11/16 0001      Knee/Hip Exercises: Standing   Heel Raises Both;1 set;20 reps   Heel Raises Limitations heel and toe    Lateral Step Up Both;1 set;15 reps   Lateral Step Up Limitations 6 inch box    Forward Step Up Both;15 reps;1 set   Forward Step Up Limitations 6 inch box    Step Down Both;1 set;10 reps   Step Down Limitations 4 inch box    Wall Squat 15 reps   Wall Squat Limitations 2 second holds    Rocker Board 2 minutes   Rocker Board Limitations AP and lateral, no UEs              Balance Exercises - 11/11/16 0919      Balance Exercises: Standing   Tandem Stance Eyes open;3 reps;Other (comment);15 secs  progressing to tandem stance with head turns    SLS Eyes open;Solid surface;3 reps   Tandem Gait Forward;4  reps;Other (comment)  in // bars    Cone Rotation Solid surface   Cone Rotation Limitations cone taps on single plane, 1-4 sequence            PT Education - 11/11/16 0945    Education provided Yes   Education Details possible DOMS    Person(s) Educated Patient   Methods Explanation   Comprehension Verbalized understanding          PT Short Term Goals - 11/04/16 1600      PT SHORT TERM GOAL #1   Title Pt pain to be no greater than a 4/10 to allow pt to only wake one time during the night   Time 2   Period Weeks   Status On-going     PT SHORT TERM GOAL #2   Title Pt to be able to sit for 60 mintues to be able to enjoy a meal, sit at social settings in comfort    Time 2   Period Weeks   Status On-going     PT SHORT TERM GOAL #3   Title Pt to be able to stand for 20 minutes in comfort to be able to return to fishing    Time 2   Period Weeks     PT SHORT TERM GOAL #4   Title Pt to be able to walk for 20 minute to be able to complete short shopping trips in comfort   Time 2   Period Weeks   Status On-going           PT Long Term Goals - 11/04/16 1600      PT LONG TERM GOAL #1   Title Pt pain to be at the most a 2/10 to allow pt to sleep throughout the night    Time 4   Period Weeks   Status On-going     PT LONG TERM GOAL #2   Title Pt to be able to sit for 2 hours to be able to travel in a car    Time 4   Period Weeks   Status On-going     PT LONG TERM GOAL #3   Title Pt strength in left leg  to be at least a 4/5 in all muscular groups to be able to go up and down steps in a reciprocal manner   Time 4   Period Weeks   Status On-going     PT LONG TERM GOAL #4   Title Pt to be able to single leg  stance for 15 seconds on each LE to reduce risk of falling    Time 4   Period Weeks   Status Partially Met     PT LONG TERM GOAL #5   Title Pt to be able to walk for 40 mintues without increased pain with a normal gait pattern to be able to complete  shopping needs    Time 4   Period Weeks   Status On-going               Plan - 11/11/16 0946    Clinical Impression Statement Patient arrives today reporting he is doing well, his main concern is balance at the moment; continued some work on functional strength however also proceeded to increase focus on balance this session as well to address patient concern and improve safety with general mobility. Continue to not difficulty with balance based tasks possibly related to weakness in hip musculature. Fatigue noted at end of session.    Rehab Potential Good   PT Frequency 3x / week   PT Duration 4 weeks   PT Treatment/Interventions ADLs/Self Care Home Management;Gait training;Stair training;Functional mobility training;Therapeutic activities;Therapeutic exercise;Balance training;Patient/family education;Manual techniques   PT Next Visit Plan continues to progress functional strength and balance    PT Home Exercise Plan Eval: SLR, hip abducton, extension, heel raises   Consulted and Agree with Plan of Care Patient      Patient will benefit from skilled therapeutic intervention in order to improve the following deficits and impairments:  Abnormal gait, Decreased activity tolerance, Decreased balance, Decreased strength, Pain  Visit Diagnosis: Pain in left hip  Muscle weakness (generalized)  Difficulty in walking, not elsewhere classified     Problem List Patient Active Problem List   Diagnosis Date Noted  . History of diabetes mellitus 10/10/2016  . Avascular necrosis of hip, left ([place]) 10/04/2016  . Status post left hip replacement 10/04/2016  . Rash and nonspecific skin eruption 08/12/2013  . Cholelithiases 08/04/2013  . Subcutaneous nodule 07/21/2013  . Acute alcoholic pancreatitis 86/48/4720  . Syncope 04/11/2013  . Prolonged Q-T interval on ECG 04/11/2013  . Cocaine abuse 03/13/2013    Class: Chronic  . At risk for adverse drug event 02/14/2013  . DDD  (degenerative disc disease), lumbar 02/01/2013  . Dyspepsia 12/01/2012  . BPH (benign prostatic hyperplasia) 10/07/2012  . Allergic rhinitis 10/03/2012  . Transaminitis 10/02/2012  . Chronic pain syndrome 10/02/2012  . Essential hypertension, benign 07/01/2012  . Tobacco user 07/01/2012  . Hepatic steatosis 03/06/2012  . ED (erectile dysfunction) 03/06/2012  . Pseudocyst of pancreas 02/28/2012  . Alcohol abuse 02/25/2012  . GERD (gastroesophageal reflux disease) 02/23/2012  . Bipolar 1 disorder ([place]) 02/23/2012  . Hypertriglyceridemia 12/05/2011    Deniece Ree PT, DPT [facility] [address] [place], [place], [address] Phone: [phone]   Fax:  [phone]  Name: Delno Blaisdell. MRN: [id number] Date of Birth: August 16, 1961

## 2016-11-12 ENCOUNTER — Ambulatory Visit (HOSPITAL_COMMUNITY): Payer: Medicare Other | Admitting: Physical Therapy

## 2016-11-12 DIAGNOSIS — M25552 Pain in left hip: Secondary | ICD-10-CM

## 2016-11-12 DIAGNOSIS — M6281 Muscle weakness (generalized): Secondary | ICD-10-CM

## 2016-11-12 DIAGNOSIS — R262 Difficulty in walking, not elsewhere classified: Secondary | ICD-10-CM

## 2016-11-12 NOTE — Therapy (Signed)
[facility] [address] [place], [place], [address] Phone: [phone]   Fax:  [phone]  Physical Therapy Treatment  Patient Details  Name: [name] [name]. MRN: [id number] Date of Birth: 01-22-1961 Referring Provider: Jean Rosenthal  Encounter Date: 11/12/2016      PT End of Session - 11/12/16 1010    Visit Number 6   Number of Visits 12   Date for PT Re-Evaluation 11/28/16   Authorization Type medicare   Authorization - Visit Number 5   Authorization - Number of Visits 10   PT Start Time 0950   PT Stop Time 1030   PT Time Calculation (min) 40 min   Activity Tolerance Patient tolerated treatment well;No increased pain   Behavior During Therapy WFL for tasks assessed/performed      Past Medical History:  Diagnosis Date  . Acid reflux   . Alcohol abuse    6-8 cans of beer daily; hx of incarceration for DWI  . Arthritis   . Bipolar disorder ([place])   . Bronchitis   . Chronic back pain   . Chronic neck pain   . Chronic pain   . Diabetes mellitus without complication ([place])   . Fracture of lower leg   . Gout   . Hyperlipidemia   . Hypertension   . Left arm pain    chronic  . Pancreatitis    March 2013  . Pseudocyst of pancreas 02/28/2012    Past Surgical History:  Procedure Laterality Date  . CIRCUMCISION  03/17/2012   Procedure: CIRCUMCISION ADULT;  Surgeon: Marissa Nestle, MD;  Location: [facility];  Service: Urology;  Laterality: N/A;  . COLONOSCOPY WITH PROPOFOL  12/24/2012   [id number] lesion as described above status post biopsy; otherwise normal rectum/Sigmoid diverticulosis/Sigmoid polyps -- resected as described above. anal lesion, benign. colon polyp, hyperplastic  . ESOPHAGOGASTRODUODENOSCOPY (EGD) WITH PROPOFOL  12/24/2012   [id number] erythema and erosions of uncertain significance status post gastric biopsy (reactive gastropathy NO h.pylori)  . NOSE SURGERY     broken nose  . TOTAL HIP ARTHROPLASTY Left  10/04/2016   Procedure: LEFT TOTAL HIP ARTHROPLASTY ANTERIOR APPROACH;  Surgeon: Mcarthur Rossetti, MD;  Location: [facility];  Service: Orthopedics;  Laterality: Left;    There were no vitals filed for this visit.      Subjective Assessment - 11/12/16 0955    Subjective Pt states that he has been walking 15-20 minutes.  He is still having difficulty with prolong standing.     Pertinent History Summary of [facility] medical records: The patient was hospitalized 10/13-10/17/17 undergoing a left total hip arthroplasty 10/13 by Dr. Ninfa Linden because of avascular necrosis in the context of a history of alcohol abuse.   Pain Score 2    Pain Location Hip   Pain Orientation Left   Pain Descriptors / Indicators Aching   Pain Type Surgical pain   Pain Onset 1 to 4 weeks ago   Pain Frequency Intermittent   Aggravating Factors  weight bearing    Pain Relieving Factors resting; medication                          [facility] Adult PT Treatment/Exercise - 11/12/16 0001      Balance Poses: Yoga   Warrior I 1 rep;60 seconds  both LE    Warrior II 1 rep;60 seconds  both LE      Knee/Hip Exercises: Standing  Stairs 4 RT    SLS with Vectors 15" x 3 reps  B    Other Standing Knee Exercises lateral sidestepping in hallways 2x32 ft with blue TB around ankles    Other Standing Knee Exercises lifting box x 10      Knee/Hip Exercises: Seated   Sit to Sand 10 reps  slowly to build mm             Balance Exercises - 11/12/16 1028      Balance Exercises: Standing   Balance Beam forward and retro x 2 Reps each           PT Education - 11/11/16 0945    Education provided Yes   Education Details possible DOMS    Person(s) Educated Patient   Methods Explanation   Comprehension Verbalized understanding          PT Short Term Goals - 11/12/16 1010      PT SHORT TERM GOAL #1   Title Pt pain to be no greater than a 4/10 to allow pt to only wake one time  during the night   Time 2   Period Weeks   Status On-going     PT SHORT TERM GOAL #2   Title Pt to be able to sit for 60 mintues to be able to enjoy a meal, sit at social settings in comfort    Time 2   Period Weeks   Status On-going     PT SHORT TERM GOAL #3   Title Pt to be able to stand for 20 minutes in comfort to be able to return to fishing    Time 2   Period Weeks   Status Achieved     PT SHORT TERM GOAL #4   Title Pt to be able to walk for 20 minute to be able to complete short shopping trips in comfort   Time 2   Period Weeks   Status Achieved           PT Long Term Goals - 11/12/16 1011      PT LONG TERM GOAL #1   Title Pt pain to be at the most a 2/10 to allow pt to sleep throughout the night    Time 4   Period Weeks   Status On-going     PT LONG TERM GOAL #2   Title Pt to be able to sit for 2 hours to be able to travel in a car    Time 4   Period Weeks   Status On-going     PT LONG TERM GOAL #3   Title Pt strength in left leg  to be at least a 4/5 in all muscular groups to be able to go up and down steps in a reciprocal manner   Time 4   Period Weeks   Status Achieved     PT LONG TERM GOAL #4   Title Pt to be able to single leg stance for 15 seconds on each LE to reduce risk of falling    Time 4   Period Weeks   Status Achieved     PT LONG TERM GOAL #5   Title Pt to be able to walk for 40 mintues without increased pain with a normal gait pattern to be able to complete shopping needs    Time 4   Period Weeks   Status On-going               Plan -  11/11/16 0946    Clinical Impression Statement Patient arrives today reporting he is doing well, his main concern is balance at the moment; continued some work on functional strength however also proceeded to increase focus on balance this session as well to address patient concern and improve safety with general mobility. Continue to not difficulty with balance based tasks possibly related to  weakness in hip musculature. Fatigue noted at end of session.    Rehab Potential Good   PT Frequency 3x / week   PT Duration 4 weeks   PT Treatment/Interventions ADLs/Self Care Home Management;Gait training;Stair training;Functional mobility training;Therapeutic activities;Therapeutic exercise;Balance training;Patient/family education;Manual techniques   PT Next Visit Plan continues to progress functional strength and balance    PT Home Exercise Plan Eval: SLR, hip abducton, extension, heel raises   Consulted and Agree with Plan of Care Patient      Patient will benefit from skilled therapeutic intervention in order to improve the following deficits and impairments:     Visit Diagnosis: Pain in left hip  Muscle weakness (generalized)  Difficulty in walking, not elsewhere classified     Problem List Patient Active Problem List   Diagnosis Date Noted  . History of diabetes mellitus 10/10/2016  . Avascular necrosis of hip, left ([place]) 10/04/2016  . Status post left hip replacement 10/04/2016  . Rash and nonspecific skin eruption 08/12/2013  . Cholelithiases 08/04/2013  . Subcutaneous nodule 07/21/2013  . Acute alcoholic pancreatitis [id number]  . Syncope 04/11/2013  . Prolonged Q-T interval on ECG 04/11/2013  . Cocaine abuse 03/13/2013    Class: Chronic  . At risk for adverse drug event 02/14/2013  . DDD (degenerative disc disease), lumbar 02/01/2013  . Dyspepsia 12/01/2012  . BPH (benign prostatic hyperplasia) 10/07/2012  . Allergic rhinitis 10/03/2012  . Transaminitis 10/02/2012  . Chronic pain syndrome 10/02/2012  . Essential hypertension, benign 07/01/2012  . Tobacco user 07/01/2012  . Hepatic steatosis 03/06/2012  . ED (erectile dysfunction) 03/06/2012  . Pseudocyst of pancreas 02/28/2012  . Alcohol abuse 02/25/2012  . GERD (gastroesophageal reflux disease) 02/23/2012  . Bipolar 1 disorder ([place]) 02/23/2012  . Hypertriglyceridemia 12/05/2011    Rayetta Humphrey,  PT CLT [phone] 11/12/2016, 10:30 AM  [facility] [address] [place], [place], [address] Phone: [phone]   Fax:  [phone]  Name: Andrej Crofts. MRN: [id number] Date of Birth: 03/06/1961

## 2016-11-13 ENCOUNTER — Ambulatory Visit (INDEPENDENT_AMBULATORY_CARE_PROVIDER_SITE_OTHER): Payer: Medicare Other | Admitting: Physician Assistant

## 2016-11-13 DIAGNOSIS — Z96642 Presence of left artificial hip joint: Secondary | ICD-10-CM

## 2016-11-13 NOTE — Progress Notes (Signed)
Post-Op Visit Note   Patient: [name] [name].           Date of Birth: 1961/08/21           MRN: [id number] Visit Date: 11/13/2016 PCP: Lanette Hampshire, MD   Assessment & Plan:  Chief Complaint:  Chief Complaint  Patient presents with  . Left Hip - Routine Post Op   Visit Diagnoses:  1. History of total left hip replacement    HPI: Mr. Marczak is overall doing well no complaints outside of some numbness around the incision. He has visits with physical therapy until around December 1. He is very happy with the progress of his hip.  Exam: Left hip good range of motion without pain. Left calf supple nontender. He ambulates without any assistive or antalgic gait.  Plan: He is to finish up with physical therapy. Scar tissue mobilization. He'll follow up with [place] at 1 year postop for [facility] pelvis and lateral view of the left hip. He understands he can always follow up with [place] sooner if there is any questions or concerns.  Follow-Up Instructions: Return in about 11 months (around 10/13/2017).   Orders:  No orders of the defined types were placed in this encounter.  No orders of the defined types were placed in this encounter.    PMFS History: Patient Active Problem List   Diagnosis Date Noted  . History of diabetes mellitus 10/10/2016  . Avascular necrosis of hip, left ([place]) 10/04/2016  . Status post left hip replacement 10/04/2016  . Rash and nonspecific skin eruption 08/12/2013  . Cholelithiases 08/04/2013  . Subcutaneous nodule 07/21/2013  . Acute alcoholic pancreatitis [id number]  . Syncope 04/11/2013  . Prolonged Q-T interval on ECG 04/11/2013  . Cocaine abuse 03/13/2013    Class: Chronic  . At risk for adverse drug event 02/14/2013  . DDD (degenerative disc disease), lumbar 02/01/2013  . Dyspepsia 12/01/2012  . BPH (benign prostatic hyperplasia) 10/07/2012  . Allergic rhinitis 10/03/2012  . Transaminitis 10/02/2012  . Chronic pain syndrome 10/02/2012  . Essential  hypertension, benign 07/01/2012  . Tobacco user 07/01/2012  . Hepatic steatosis 03/06/2012  . ED (erectile dysfunction) 03/06/2012  . Pseudocyst of pancreas 02/28/2012  . Alcohol abuse 02/25/2012  . GERD (gastroesophageal reflux disease) 02/23/2012  . Bipolar 1 disorder ([place]) 02/23/2012  . Hypertriglyceridemia 12/05/2011   Past Medical History:  Diagnosis Date  . Acid reflux   . Alcohol abuse    6-8 cans of beer daily; hx of incarceration for DWI  . Arthritis   . Bipolar disorder ([place])   . Bronchitis   . Chronic back pain   . Chronic neck pain   . Chronic pain   . Diabetes mellitus without complication ([place])   . Fracture of lower leg   . Gout   . Hyperlipidemia   . Hypertension   . Left arm pain    chronic  . Pancreatitis    March 2013  . Pseudocyst of pancreas 02/28/2012    Family History  Problem Relation Age of Onset  . Diabetes Father   . Anesthesia problems Neg Hx   . Hypotension Neg Hx   . Malignant hyperthermia Neg Hx   . Pseudochol deficiency Neg Hx   . Colon cancer Neg Hx   . Heart disease Neg Hx   . Stroke Neg Hx   . Cancer Neg Hx     Past Surgical History:  Procedure Laterality Date  . CIRCUMCISION  03/17/2012  Procedure: CIRCUMCISION ADULT;  Surgeon: Marissa Nestle, MD;  Location: [facility];  Service: Urology;  Laterality: N/A;  . COLONOSCOPY WITH PROPOFOL  12/24/2012   [id number] lesion as described above status post biopsy; otherwise normal rectum/Sigmoid diverticulosis/Sigmoid polyps -- resected as described above. anal lesion, benign. colon polyp, hyperplastic  . ESOPHAGOGASTRODUODENOSCOPY (EGD) WITH PROPOFOL  12/24/2012   [id number] erythema and erosions of uncertain significance status post gastric biopsy (reactive gastropathy NO h.pylori)  . NOSE SURGERY     broken nose  . TOTAL HIP ARTHROPLASTY Left 10/04/2016   Procedure: LEFT TOTAL HIP ARTHROPLASTY ANTERIOR APPROACH;  Surgeon: Mcarthur Rossetti, MD;  Location: [facility];  Service: Orthopedics;   Laterality: Left;   Social History   Occupational History  . unemployed    Social History Main Topics  . Smoking status: Current Every Day Smoker    Packs/day: 0.50    Years: 25.00    Types: Cigarettes  . Smokeless tobacco: Former Systems developer    Types: Chew  . Alcohol use 0.0 oz/week     Comment: 6-8 beers a day  . Drug use:     Types: Cocaine, Marijuana     Comment: hx of marijuana and cocaine use last used 3 weeks ago  . Sexual activity: Not Currently

## 2016-11-13 NOTE — Progress Notes (Deleted)
Office Visit Note   Patient: [name] [name].           Date of Birth: 1961-06-05           MRN: [id number] Visit Date: 11/13/2016              Requested by: Marjean Donna, MD [place], [place] [address] PCP: Lanette Hampshire, MD   Assessment & Plan: Visit Diagnoses: No diagnosis found.  Plan: ***  Follow-Up Instructions: No Follow-up on file.   Orders:  No orders of the defined types were placed in this encounter.  No orders of the defined types were placed in this encounter.     Procedures: No procedures performed   Clinical Data: No additional findings.   Subjective: No chief complaint on file.   Patient returns for 4 week follow up status post left total hip arthroplasty 10/04/2016.  He states that he is doing well. He still is unable to stand for long periods of time and his balance is off a little. He states that they are working on that in physical therapy.  He still has some numbness at incision. He takes Hydrocodone for pain.    Review of Systems   Objective: Vital Signs: There were no vitals taken for this visit.  Physical Exam  Ortho Exam  Specialty Comments:  No specialty comments available.  Imaging: No results found.   PMFS History: Patient Active Problem List   Diagnosis Date Noted  . History of diabetes mellitus 10/10/2016  . Avascular necrosis of hip, left ([place]) 10/04/2016  . Status post left hip replacement 10/04/2016  . Rash and nonspecific skin eruption 08/12/2013  . Cholelithiases 08/04/2013  . Subcutaneous nodule 07/21/2013  . Acute alcoholic pancreatitis [id number]  . Syncope 04/11/2013  . Prolonged Q-T interval on ECG 04/11/2013  . Cocaine abuse 03/13/2013    Class: Chronic  . At risk for adverse drug event 02/14/2013  . DDD (degenerative disc disease), lumbar 02/01/2013  . Dyspepsia 12/01/2012  . BPH (benign prostatic hyperplasia) 10/07/2012  . Allergic rhinitis 10/03/2012  . Transaminitis 10/02/2012    . Chronic pain syndrome 10/02/2012  . Essential hypertension, benign 07/01/2012  . Tobacco user 07/01/2012  . Hepatic steatosis 03/06/2012  . ED (erectile dysfunction) 03/06/2012  . Pseudocyst of pancreas 02/28/2012  . Alcohol abuse 02/25/2012  . GERD (gastroesophageal reflux disease) 02/23/2012  . Bipolar 1 disorder ([place]) 02/23/2012  . Hypertriglyceridemia 12/05/2011   Past Medical History:  Diagnosis Date  . Acid reflux   . Alcohol abuse    6-8 cans of beer daily; hx of incarceration for DWI  . Arthritis   . Bipolar disorder ([place])   . Bronchitis   . Chronic back pain   . Chronic neck pain   . Chronic pain   . Diabetes mellitus without complication ([place])   . Fracture of lower leg   . Gout   . Hyperlipidemia   . Hypertension   . Left arm pain    chronic  . Pancreatitis    March 2013  . Pseudocyst of pancreas 02/28/2012    Family History  Problem Relation Age of Onset  . Diabetes Father   . Anesthesia problems Neg Hx   . Hypotension Neg Hx   . Malignant hyperthermia Neg Hx   . Pseudochol deficiency Neg Hx   . Colon cancer Neg Hx   . Heart disease Neg Hx   . Stroke Neg Hx   . Cancer Neg Hx  Past Surgical History:  Procedure Laterality Date  . CIRCUMCISION  03/17/2012   Procedure: CIRCUMCISION ADULT;  Surgeon: Marissa Nestle, MD;  Location: [facility];  Service: Urology;  Laterality: N/A;  . COLONOSCOPY WITH PROPOFOL  12/24/2012   [id number] lesion as described above status post biopsy; otherwise normal rectum/Sigmoid diverticulosis/Sigmoid polyps -- resected as described above. anal lesion, benign. colon polyp, hyperplastic  . ESOPHAGOGASTRODUODENOSCOPY (EGD) WITH PROPOFOL  12/24/2012   [id number] erythema and erosions of uncertain significance status post gastric biopsy (reactive gastropathy NO h.pylori)  . NOSE SURGERY     broken nose  . TOTAL HIP ARTHROPLASTY Left 10/04/2016   Procedure: LEFT TOTAL HIP ARTHROPLASTY ANTERIOR APPROACH;  Surgeon: Mcarthur Rossetti, MD;  Location: [facility];  Service: Orthopedics;  Laterality: Left;   Social History   Occupational History  . unemployed    Social History Main Topics  . Smoking status: Current Every Day Smoker    Packs/day: 0.50    Years: 25.00    Types: Cigarettes  . Smokeless tobacco: Former Systems developer    Types: Chew  . Alcohol use 0.0 oz/week     Comment: 6-8 beers a day  . Drug use:     Types: Cocaine, Marijuana     Comment: hx of marijuana and cocaine use last used 3 weeks ago  . Sexual activity: Not Currently

## 2016-11-18 ENCOUNTER — Encounter (HOSPITAL_COMMUNITY): Payer: Self-pay | Admitting: Specialist

## 2016-11-18 ENCOUNTER — Ambulatory Visit (HOSPITAL_COMMUNITY): Payer: Medicare Other | Admitting: Specialist

## 2016-11-18 ENCOUNTER — Ambulatory Visit (HOSPITAL_COMMUNITY): Payer: Medicare Other | Admitting: Physical Therapy

## 2016-11-18 DIAGNOSIS — R29898 Other symptoms and signs involving the musculoskeletal system: Secondary | ICD-10-CM

## 2016-11-18 DIAGNOSIS — M25552 Pain in left hip: Secondary | ICD-10-CM | POA: Diagnosis not present

## 2016-11-18 DIAGNOSIS — R262 Difficulty in walking, not elsewhere classified: Secondary | ICD-10-CM

## 2016-11-18 DIAGNOSIS — M6281 Muscle weakness (generalized): Secondary | ICD-10-CM

## 2016-11-18 NOTE — Patient Instructions (Signed)
Strengthening: Chest Pull - Resisted   Hold Theraband in front of body with hands about shoulder width a part. Pull band a part and back together slowly. Repeat ____ times. Complete ____ set(s) per session.. Repeat ____ session(s) per day.  http://orth.exer.us/926   Copyright  VHI. All rights reserved.   PNF Strengthening: Resisted   Standing with resistive band around each hand, bring right arm up and away, thumb back. Repeat ____ times per set. Do ____ sets per session. Do ____ sessions per day.                           Resisted External Rotation: in Neutral - Bilateral   Sit or stand, tubing in both hands, elbows at sides, bent to 90, forearms forward. Pinch shoulder blades together and rotate forearms out. Keep elbows at sides. Repeat ____ times per set. Do ____ sets per session. Do ____ sessions per day.  http://orth.exer.us/966   Copyright  VHI. All rights reserved.   PNF Strengthening: Resisted   Standing, hold resistive band above head. Bring right arm down and out from side. Repeat ____ times per set. Do ____ sets per session. Do ____ sessions per day.  http://orth.exer.us/922   Copyright  VHI. All rights reserved.  Protraction: Push-Up (Wall)  Lean to wall, feet flat, ____ inches from wall, arms bent, trunk straight, hands directly in front of shoulders, thumbs facing each other. Push to straight arms. Repeat ____ times per set. Do ____ sets per session. Do ____ sessions per week.

## 2016-11-18 NOTE — Patient Instructions (Signed)
   WALL SQUATS  Leaning up against a wall or closed door on your back, slide your body downward, hold for 3 seconds, and then return back to upright position.  A door was used here because it was smoother and had less friction than the wall.   Knees should bend in line with the 2nd toe and not pass the front of the foot.  Repeat 15 times, 1-2 times per day.    SQUAT - SUPPORTED WITH CHAIR FOR SAFETY  Place a chair behind you for safety.   While standing with feet shoulder width apart and in front of a stable support for balance assist if needed, bend your knees and lower your body towards the floor. Your body weight should mostly be directed through the heels of your feet. Return to a standing position.   Knees should bend in line with the 2nd toe and not pass the front of the foot.  Repeat 10-15 times, 1-2 times per day.     Tandem Stance  Standing at your counter top, place right heel to left toes.  Use the counter top for safey/balance if needed.  Hold as long as you can, then switch sides; repeat 3 times each side, 1-2 times per day.    SINGLE LEG STANCE - SLS  Stand on one leg and maintain your balance. Do this exercise near a kitchen counter or table for safety.  Hold as long as you can, then switch sides.   Repeat 3 times each leg, 1-2 times per day.

## 2016-11-18 NOTE — Therapy (Signed)
[facility] [place], [place], [address] Phone: [phone]   Fax:  [phone]  Occupational Therapy Evaluation  Patient Details  Name: [name] [name]. MRN: [id number] Date of Birth: 06/06/1961 Referring Provider: Dr. Santiago [name]  Encounter Date: 11/18/2016      OT End of Session - 11/18/16 1235    Visit Number 1   Number of Visits 1   Authorization Type Medicare   Authorization Time Period before 10th visit   Authorization - Visit Number 1   Authorization - Number of Visits 1   OT Start Time 0905   OT Stop Time 0945   OT Time Calculation (min) 40 min   Activity Tolerance Patient tolerated treatment well   Behavior During Therapy [facility] for tasks assessed/performed      Past Medical History:  Diagnosis Date  . Acid reflux   . Alcohol abuse    6-8 cans of beer daily; hx of incarceration for DWI  . Arthritis   . Bipolar disorder ([place])   . Bronchitis   . Chronic back pain   . Chronic neck pain   . Chronic pain   . Diabetes mellitus without complication ([employer])   . Fracture of lower leg   . Gout   . Hyperlipidemia   . Hypertension   . Left arm pain    chronic  . Pancreatitis    March 2013  . Pseudocyst of pancreas 02/28/2012    Past Surgical History:  Procedure Laterality Date  . CIRCUMCISION  03/17/2012   Procedure: CIRCUMCISION ADULT;  Surgeon: [name] Nestle, MD;  Location: [facility];  Service: Urology;  Laterality: N/A;  . COLONOSCOPY WITH PROPOFOL  12/24/2012   [id number] lesion as described above status post biopsy; otherwise normal rectum/Sigmoid diverticulosis/Sigmoid polyps -- resected as described above. anal lesion, benign. colon polyp, hyperplastic  . ESOPHAGOGASTRODUODENOSCOPY (EGD) WITH PROPOFOL  12/24/2012   [id number] erythema and erosions of uncertain significance status post gastric biopsy (reactive gastropathy NO h.pylori)  . NOSE SURGERY     broken nose  . TOTAL HIP ARTHROPLASTY Left  10/04/2016   Procedure: LEFT TOTAL HIP ARTHROPLASTY ANTERIOR APPROACH;  Surgeon: [name] Rossetti, MD;  Location: [facility];  Service: Orthopedics;  Laterality: Left;    There were no vitals filed for this visit.      Subjective Assessment - 11/18/16 0941    Subjective  S:  When I was at [facility], I was getting OT and PT.  I would like to get my arms stronger.   Pertinent History [name] had a left anterior THA on 10/04/16.  He recieved therapy at SNF from 10/08/16-10/22/16.  He has been recieving outpatient PT for a few weeks at this clinic, and has been referred to occupational therapy for BUE strengthening.     Patient Stated Goals I want to get my arm and upper body strength back.    Currently in Pain? No/denies           [facility] OT Assessment - 11/18/16 0001      Assessment   Diagnosis BUE Weakness   Referring Provider Dr. Santiago [name]   Onset Date 10/04/16   Prior Therapy SNF PT and OT, OP PT     Precautions   Precautions None     Restrictions   Weight Bearing Restrictions No     Balance Screen   Has the patient fallen in the past 6 months No   Has the patient had a  decrease in activity level because of a fear of falling?  No   Is the patient reluctant to leave their home because of a fear of falling?  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   Leisure fishing, hunting      ADL   ADL comments independent - reports doing a bit of yardwork over the weekend and could complete about 45 minutes of work before needing to rest     Written Expression   Dominant Hand Right     Vision - History   Baseline Vision Wears glasses all the time     Cognition   Overall Cognitive Status Within  Functional Limits for tasks assessed     Observation/Other Assessments   Observations patient demonstrating A/ROM and strength within normal range      Sensation   Light Touch Appears Intact     Coordination   Gross Motor Movements are Fluid and Coordinated Yes   Fine Motor Movements are Fluid and Coordinated Yes     ROM / Strength   AROM / PROM / Strength AROM;Strength     AROM   Overall AROM Comments BUE A/ROM is WNL.     Strength   Overall Strength Comments BUE strength is WNL.     Hand Function   Right Hand Grip (lbs) 120   Right Hand 3 Point Pinch 22 lbs   Left Hand Grip (lbs) 105   Left Hand Lateral Pinch 18 lbs   Comment patient complaining of pain and soreness in right CMC joint.  CMC joint is swollen and inflamed.  Patient to discuss with MD at next visit on 12/04/16                      Balance Exercises - 11/18/16 0834      Balance Exercises: Standing   Tandem Stance Eyes open;3 reps;15 secs;Other (comment)  head turns    SLS Eyes open;Solid surface;3 reps;Other (comment)  head turns    SLS with Vectors Foam/compliant surface;3 reps;Other (comment)  3 cone tap    Retro Gait 4 reps;Other (comment)  62ft hallway    Other Standing Exercises standing heel raises and marches on foam pad, no UEs 1x20  heel and toe walks 2x15 each blue line            OT Education - 11/18/16 1234    Education provided Yes   Education Details educated patient on HEP for theraband for scapular stability.  retraction, external rotation, and PNF diagnals.  Recommended completing exercise program at [facility] and issued patient a waiver for 2 week session at the [facility]) Educated Patient   Methods Explanation;Demonstration;Handout   Comprehension Verbalized understanding;Returned demonstration          OT Short Term Goals - 11/18/16 1239      OT SHORT TERM GOAL #1   Title Patient will be educated and independent with a HEP for BUE strengthening.    Time  1   Period Days   Status Achieved                  Plan - 11/18/16 1236  Clinical Impression Statement A:  patient is a 55 year old male who underwent left anterior THA on 10/04/16.  He is currently receiving outpatient PT and had been receiving PT and OT at snf for short term rehab.  He was referred to occupational therapy for upper body strengthening, however, patient has WNL A/ROM and strength in BUE.  Patient is having pain in his right [facility] joint, which he plans on following up with his PCP about on 12/04/16.  Therapist educated patient on a HEP for upper body conditioning and recommended a [employer] (issued a 2 week trial membership waiver to patient today).  Patient was educated on proper technique to complete pushups, while following anterior hip precautions.  No skilled OT indicated at this time.    OT Frequency One time visit   OT Treatment/Interventions Self-care/ADL training   Plan P:  DC from skilled OT intervention this date.    OT Home Exercise Plan scapular strengthening with theraband.   Consulted and Agree with Plan of Care Patient      Patient will benefit from skilled therapeutic intervention in order to improve the following deficits and impairments:  Decreased strength  Visit Diagnosis: Other symptoms and signs involving the musculoskeletal system - Plan: Ot plan of care cert/re-cert      G-Codes - [id number]    Functional Assessment Tool Used clinical observation    Functional Limitation Carrying, moving and handling objects   Carrying, Moving and Handling Objects Current Status [phone]) 0 percent impaired, limited or restricted   Carrying, Moving and Handling Objects Goal Status [id number]) 0 percent impaired, limited or restricted   Carrying, Moving and Handling Objects Discharge Status [phone]) 0 percent impaired, limited or restricted      Problem List Patient Active Problem List   Diagnosis Date Noted  . History of diabetes mellitus  10/10/2016  . Avascular necrosis of hip, left ([place]) 10/04/2016  . Status post left hip replacement 10/04/2016  . Rash and nonspecific skin eruption 08/12/2013  . Cholelithiases 08/04/2013  . Subcutaneous nodule 07/21/2013  . Acute alcoholic pancreatitis [id number]  . Syncope 04/11/2013  . Prolonged Q-T interval on ECG 04/11/2013  . Cocaine abuse 03/13/2013    Class: Chronic  . At risk for adverse drug event 02/14/2013  . DDD (degenerative disc disease), lumbar 02/01/2013  . Dyspepsia 12/01/2012  . BPH (benign prostatic hyperplasia) 10/07/2012  . Allergic rhinitis 10/03/2012  . Transaminitis 10/02/2012  . Chronic pain syndrome 10/02/2012  . Essential hypertension, benign 07/01/2012  . Tobacco user 07/01/2012  . Hepatic steatosis 03/06/2012  . ED (erectile dysfunction) 03/06/2012  . Pseudocyst of pancreas 02/28/2012  . Alcohol abuse 02/25/2012  . GERD (gastroesophageal reflux disease) 02/23/2012  . Bipolar 1 disorder ([place]) 02/23/2012  . Hypertriglyceridemia 12/05/2011    [facility] 11/18/2016, 12:43 PM  [facility] [address] [place], [place], [address] Phone: [phone]   Fax:  [phone]  Name: [name] [name]. MRN: [id number] Date of Birth: 1961-08-15

## 2016-11-18 NOTE — Therapy (Signed)
[facility] [address] [place], [place], [address] Phone: [phone]   Fax:  [phone]  Physical Therapy Treatment  Patient Details  Name: [name] [name]. MRN: [id number] Date of Birth: Apr 27, 1961 Referring Provider: Jean Rosenthal  Encounter Date: 11/18/2016      PT End of Session - 11/18/16 0858    Visit Number 7   Number of Visits 12   Date for PT Re-Evaluation 11/22/16   Authorization Type medicare   Authorization - Visit Number 7   Authorization - Number of Visits 10   PT Start Time 0816   PT Stop Time 0856   PT Time Calculation (min) 40 min   Activity Tolerance Patient tolerated treatment well   Behavior During Therapy [facility] for tasks assessed/performed      Past Medical History:  Diagnosis Date  . Acid reflux   . Alcohol abuse    6-8 cans of beer daily; hx of incarceration for DWI  . Arthritis   . Bipolar disorder ([place])   . Bronchitis   . Chronic back pain   . Chronic neck pain   . Chronic pain   . Diabetes mellitus without complication ([place])   . Fracture of lower leg   . Gout   . Hyperlipidemia   . Hypertension   . Left arm pain    chronic  . Pancreatitis    March 2013  . Pseudocyst of pancreas 02/28/2012    Past Surgical History:  Procedure Laterality Date  . CIRCUMCISION  03/17/2012   Procedure: CIRCUMCISION ADULT;  Surgeon: Marissa Nestle, MD;  Location: [facility];  Service: Urology;  Laterality: N/A;  . COLONOSCOPY WITH PROPOFOL  12/24/2012   [id number] lesion as described above status post biopsy; otherwise normal rectum/Sigmoid diverticulosis/Sigmoid polyps -- resected as described above. anal lesion, benign. colon polyp, hyperplastic  . ESOPHAGOGASTRODUODENOSCOPY (EGD) WITH PROPOFOL  12/24/2012   [id number] erythema and erosions of uncertain significance status post gastric biopsy (reactive gastropathy NO h.pylori)  . NOSE SURGERY     broken nose  . TOTAL HIP ARTHROPLASTY Left 10/04/2016   Procedure: LEFT TOTAL HIP ARTHROPLASTY ANTERIOR APPROACH;  Surgeon: Mcarthur Rossetti, MD;  [facility];  Service: Orthopedics;  Laterality: Left;    There were no vitals filed for this visit.      Subjective Assessment - 11/18/16 0818    Subjective Patient arrives stating that he is doing well, his MD is happy with his progress and told him to just finish up his scheduled therapy sessions, he is doing well. No major complaints/concerns besides the numbness in his incision area.    Pertinent History Summary of [facility] medical records: The patient was hospitalized 10/13-10/17/17 undergoing a left total hip arthroplasty 10/13 by Dr. Ninfa Linden because of avascular necrosis in the context of a history of alcohol abuse.   Currently in Pain? Yes   Pain Score 2    Pain Location Hip   Pain Orientation Left                         [facility] Adult PT Treatment/Exercise - 11/18/16 0001      Knee/Hip Exercises: Standing   Heel Raises Both;1 set;20 reps   Heel Raises Limitations U LE with heel raise, B LE with toe raises   Lateral Step Up Both;1 set;15 reps   Lateral Step Up Limitations 8 inch box    Forward Step Up Both;15 reps;1 set  Forward Step Up Limitations 8 inch box    Step Down Both;1 set;15 reps   Step Down Limitations 4 inch box    Functional Squat 1 set;15 reps   Functional Squat Limitations in front of chair, cues for form    Wall Squat 20 reps   Wall Squat Limitations 3 second holds    Rocker Board 2 minutes   Rocker Board Limitations AP and lateral, no UEs              Balance Exercises - 11/18/16 0834      Balance Exercises: Standing   Tandem Stance Eyes open;3 reps;15 secs;Other (comment)  head turns    SLS Eyes open;Solid surface;3 reps;Other (comment)  head turns    SLS with Vectors Foam/compliant surface;3 reps;Other (comment)  3 cone tap    Retro Gait 4 reps;Other (comment)  58ft hallway    Other Standing Exercises  standing heel raises and marches on foam pad, no UEs 1x20  heel and toe walks 2x15 each blue line            PT Education - 11/18/16 0857    Education provided Yes   Education Details HEp update    Person(s) Educated Patient   Methods Explanation;Handout;Demonstration   Comprehension Verbalized understanding;Returned demonstration          PT Short Term Goals - 11/12/16 1010      PT SHORT TERM GOAL #1   Title Pt pain to be no greater than a 4/10 to allow pt to only wake one time during the night   Time 2   Period Weeks   Status On-going     PT SHORT TERM GOAL #2   Title Pt to be able to sit for 60 mintues to be able to enjoy a meal, sit at social settings in comfort    Time 2   Period Weeks   Status On-going     PT SHORT TERM GOAL #3   Title Pt to be able to stand for 20 minutes in comfort to be able to return to fishing    Time 2   Period Weeks   Status Achieved     PT SHORT TERM GOAL #4   Title Pt to be able to walk for 20 minute to be able to complete short shopping trips in comfort   Time 2   Period Weeks   Status Achieved           PT Long Term Goals - 11/12/16 1011      PT LONG TERM GOAL #1   Title Pt pain to be at the most a 2/10 to allow pt to sleep throughout the night    Time 4   Period Weeks   Status On-going     PT LONG TERM GOAL #2   Title Pt to be able to sit for 2 hours to be able to travel in a car    Time 4   Period Weeks   Status On-going     PT LONG TERM GOAL #3   Title Pt strength in left leg  to be at least a 4/5 in all muscular groups to be able to go up and down steps in a reciprocal manner   Time 4   Period Weeks   Status Achieved     PT LONG TERM GOAL #4   Title Pt to be able to single leg stance for 15 seconds on each LE to reduce risk of  falling    Time 4   Period Weeks   Status Achieved     PT LONG TERM GOAL #5   Title Pt to be able to walk for 40 mintues without increased pain with a normal gait pattern to be  able to complete shopping needs    Time 4   Period Weeks   Status On-going               Plan - 11/18/16 0858    Clinical Impression Statement Continued working on functional strength, progressing difficulty with exercises as able/appropriate today, otherwise continued balance work as patient does continue to be concerned by this. Overall patient appears to be progressing well and is on track for possible DC at the end of this week as he reports no functional concerns or difficulties with PLOF based tasks.    Rehab Potential Good   PT Frequency 3x / week   PT Duration 4 weeks   PT Treatment/Interventions ADLs/Self Care Home Management;Gait training;Stair training;Functional mobility training;Therapeutic activities;Therapeutic exercise;Balance training;Patient/family education;Manual techniques   PT Next Visit Plan continues to progress functional strength and balance; DC on 12/1.    PT Home Exercise Plan Eval: SLR, hip abducton, extension, heel raises; 11/27: wall slides, functional squats, tandem stance, SLS    Consulted and Agree with Plan of Care Patient      Patient will benefit from skilled therapeutic intervention in order to improve the following deficits and impairments:  Abnormal gait, Decreased activity tolerance, Decreased balance, Decreased strength, Pain  Visit Diagnosis: Pain in left hip  Muscle weakness (generalized)  Difficulty in walking, not elsewhere classified     Problem List Patient Active Problem List   Diagnosis Date Noted  . History of diabetes mellitus 10/10/2016  . Avascular necrosis of hip, left ([place]) 10/04/2016  . Status post left hip replacement 10/04/2016  . Rash and nonspecific skin eruption 08/12/2013  . Cholelithiases 08/04/2013  . Subcutaneous nodule 07/21/2013  . Acute alcoholic pancreatitis [id number]  . Syncope 04/11/2013  . Prolonged Q-T interval on ECG 04/11/2013  . Cocaine abuse 03/13/2013    Class: Chronic  . At risk for  adverse drug event 02/14/2013  . DDD (degenerative disc disease), lumbar 02/01/2013  . Dyspepsia 12/01/2012  . BPH (benign prostatic hyperplasia) 10/07/2012  . Allergic rhinitis 10/03/2012  . Transaminitis 10/02/2012  . Chronic pain syndrome 10/02/2012  . Essential hypertension, benign 07/01/2012  . Tobacco user 07/01/2012  . Hepatic steatosis 03/06/2012  . ED (erectile dysfunction) 03/06/2012  . Pseudocyst of pancreas 02/28/2012  . Alcohol abuse 02/25/2012  . GERD (gastroesophageal reflux disease) 02/23/2012  . Bipolar 1 disorder ([place]) 02/23/2012  . Hypertriglyceridemia 12/05/2011    Deniece Ree PT, DPT [facility] [address] [place], [place], [address] Phone: [phone]   Fax:  [phone]  Name: Roi Hjort. MRN: [id number] Date of Birth: [birth year]

## 2016-11-20 ENCOUNTER — Ambulatory Visit (HOSPITAL_COMMUNITY): Payer: Medicare Other | Admitting: Physical Therapy

## 2016-11-20 ENCOUNTER — Telehealth (HOSPITAL_COMMUNITY): Payer: Self-pay | Admitting: Family Medicine

## 2016-11-20 DIAGNOSIS — R262 Difficulty in walking, not elsewhere classified: Secondary | ICD-10-CM

## 2016-11-20 DIAGNOSIS — M6281 Muscle weakness (generalized): Secondary | ICD-10-CM

## 2016-11-20 DIAGNOSIS — M25552 Pain in left hip: Secondary | ICD-10-CM | POA: Diagnosis not present

## 2016-11-20 DIAGNOSIS — R29898 Other symptoms and signs involving the musculoskeletal system: Secondary | ICD-10-CM

## 2016-11-20 NOTE — Telephone Encounter (Signed)
11/20/16 called to cx but then called right back to say he could come in

## 2016-11-20 NOTE — Therapy (Addendum)
[facility] [address] [place], [place], [address] Phone: [phone]   Fax:  [phone]  Physical Therapy Treatment  Patient Details  Name: Rakeen Gaillard. MRN: [id number] Date of Birth: [birth year] Referring Provider: Jean Rosenthal  Encounter Date: 11/20/2016      PT End of Session - 11/20/16 1020    Visit Number 8   Number of Visits 12   Date for PT Re-Evaluation 11/22/16   Authorization Type medicare   Authorization - Visit Number 8   Authorization - Number of Visits 10   PT Start Time 0825   PT Stop Time 0900   PT Time Calculation (min) 35 min   Activity Tolerance Patient tolerated treatment well   Behavior During Therapy [facility] for tasks assessed/performed      Past Medical History:  Diagnosis Date  . Acid reflux   . Alcohol abuse    6-8 cans of beer daily; hx of incarceration for DWI  . Arthritis   . Bipolar disorder ([place])   . Bronchitis   . Chronic back pain   . Chronic neck pain   . Chronic pain   . Diabetes mellitus without complication ([place])   . Fracture of lower leg   . Gout   . Hyperlipidemia   . Hypertension   . Left arm pain    chronic  . Pancreatitis    March 2013  . Pseudocyst of pancreas 02/28/2012    Past Surgical History:  Procedure Laterality Date  . CIRCUMCISION  03/17/2012   Procedure: CIRCUMCISION ADULT;  Surgeon: Marissa Nestle, MD;  Location: [facility];  Service: Urology;  Laterality: N/A;  . COLONOSCOPY WITH PROPOFOL  12/24/2012   FHQ:[id number] lesion as described above status post biopsy; otherwise normal rectum/Sigmoid diverticulosis/Sigmoid polyps -- resected as described above. anal lesion, benign. colon polyp, hyperplastic  . ESOPHAGOGASTRODUODENOSCOPY (EGD) WITH PROPOFOL  12/24/2012   OIT:GPQDIYM erythema and erosions of uncertain significance status post gastric biopsy (reactive gastropathy NO h.pylori)  . NOSE SURGERY     broken nose  . TOTAL HIP ARTHROPLASTY Left 10/04/2016    Procedure: LEFT TOTAL HIP ARTHROPLASTY ANTERIOR APPROACH;  Surgeon: Mcarthur Rossetti, MD;  Location: [facility];  Service: Orthopedics;  Laterality: Left;    There were no vitals filed for this visit.      Subjective Assessment - 11/20/16 1015    Subjective Pt was late for appointment.  STates he is doing well with no pain or complaints. feels he is ready for discharge next session.                         [facility] Adult PT Treatment/Exercise - 11/20/16 0001      Balance Poses: Yoga   Warrior I 2 reps;30 seconds   Warrior II 1 rep;60 seconds     Knee/Hip Exercises: Standing   Heel Raises Both;1 set;20 reps   Heel Raises Limitations U LE with heel raise, B LE with toe raises   Lateral Step Up Both;1 set;15 reps   Lateral Step Up Limitations 7 inch stair    Forward Step Up Both;15 reps;1 set   Forward Step Up Limitations 7 inch stair    Step Down Both;1 set;15 reps   Step Down Limitations 7 inch box    Functional Squat 1 set;15 reps   SLS with Vectors 15" x 3 reps  B    Other Standing Knee Exercises lateral sidestepping in hallways 2x36 ft (  full length of hall)  with blue TB around ankles      Knee/Hip Exercises: Seated   Sit to Sand 15 reps                  PT Short Term Goals - 11/12/16 1010      PT SHORT TERM GOAL #1   Title Pt pain to be no greater than a 4/10 to allow pt to only wake one time during the night   Time 2   Period Weeks   Status achieved     PT SHORT TERM GOAL #2   Title Pt to be able to sit for 60 mintues to be able to enjoy a meal, sit at social settings in comfort    Time 2   Period Weeks   Status Achieved      PT SHORT TERM GOAL #3   Title Pt to be able to stand for 20 minutes in comfort to be able to return to fishing    Time 2   Period Weeks   Status Achieved     PT SHORT TERM GOAL #4   Title Pt to be able to walk for 20 minute to be able to complete short shopping trips in comfort   Time 2   Period Weeks    Status Achieved           PT Long Term Goals - 11/12/16 1011      PT LONG TERM GOAL #1   Title Pt pain to be at the most a 2/10 to allow pt to sleep throughout the night    Time 4   Period Weeks   Status Achieved      PT LONG TERM GOAL #2   Title Pt to be able to sit for 2 hours to be able to travel in a car    Time 4   Period Weeks   Status On-going     PT LONG TERM GOAL #3   Title Pt strength in left leg  to be at least a 4/5 in all muscular groups to be able to go up and down steps in a reciprocal manner   Time 4   Period Weeks   Status Achieved     PT LONG TERM GOAL #4   Title Pt to be able to single leg stance for 15 seconds on each LE to reduce risk of falling    Time 4   Period Weeks   Status Achieved     PT LONG TERM GOAL #5   Title Pt to be able to walk for 40 mintues without increased pain with a normal gait pattern to be able to complete shopping needs    Time 4   Period Weeks   Status Achieved                Plan - 11/20/16 1020    Clinical Impression Statement Unable to complete full program due to patient being late.  continued with focus on stability and LE strength.  Able to increase to 7" step with good form for step down.  Pt without c/o pain or difficulty with any exercise.  Did show some fatigue and perspiration toward end of session. Cues needed with form of warrior poses.l   Rehab Potential Good   PT Frequency 3x / week   PT Duration 4 weeks   PT Treatment/Interventions ADLs/Self Care Home Management;Gait training;Stair training;Functional mobility training;Therapeutic activities;Therapeutic exercise;Balance training;Patient/family education;Manual techniques  PT Next Visit Plan Re-evaluate with anticipated discharge next session.    PT Home Exercise Plan Eval: SLR, hip abducton, extension, heel raises; 11/27: wall slides, functional squats, tandem stance, SLS    Consulted and Agree with Plan of Care Patient     [phone]  CK [phone]   CJ Patient will benefit from skilled therapeutic intervention in order to improve the following deficits and impairments:  Abnormal gait, Decreased activity tolerance, Decreased balance, Decreased strength, Pain  Visit Diagnosis: Other symptoms and signs involving the musculoskeletal system  Pain in left hip  Muscle weakness (generalized)  Difficulty in walking, not elsewhere classified     Problem List Patient Active Problem List   Diagnosis Date Noted  . History of diabetes mellitus 10/10/2016  . Avascular necrosis of hip, left ([place]) 10/04/2016  . Status post left hip replacement 10/04/2016  . Rash and nonspecific skin eruption 08/12/2013  . Cholelithiases 08/04/2013  . Subcutaneous nodule 07/21/2013  . Acute alcoholic pancreatitis 24/82/5003  . Syncope 04/11/2013  . Prolonged Q-T interval on ECG 04/11/2013  . Cocaine abuse 03/13/2013    Class: Chronic  . At risk for adverse drug event 02/14/2013  . DDD (degenerative disc disease), lumbar 02/01/2013  . Dyspepsia 12/01/2012  . BPH (benign prostatic hyperplasia) 10/07/2012  . Allergic rhinitis 10/03/2012  . Transaminitis 10/02/2012  . Chronic pain syndrome 10/02/2012  . Essential hypertension, benign 07/01/2012  . Tobacco user 07/01/2012  . Hepatic steatosis 03/06/2012  . ED (erectile dysfunction) 03/06/2012  . Pseudocyst of pancreas 02/28/2012  . Alcohol abuse 02/25/2012  . GERD (gastroesophageal reflux disease) 02/23/2012  . Bipolar 1 disorder ([place]) 02/23/2012  . Hypertriglyceridemia 12/05/2011    Teena Irani, PTA/CLT [phone]  11/20/2016, 10:23 AM  [facility] [address] [place], [place], [address] Phone: [phone]   Fax:  [phone]  Name: Tayon Parekh. MRN: [id number] Date of Birth: 1961-07-30  PHYSICAL THERAPY DISCHARGE SUMMARY  Visits from Start of Care: 8  Current functional level related to goals / functional outcomes: See above     Remaining deficits: none   Education / Equipment: HEP Plan: Patient agrees to discharge.  Patient goals were met. Patient is being discharged due to meeting the stated rehab goals.  ?????       Rayetta Humphrey, [place] CLT [phone]

## 2016-11-22 ENCOUNTER — Telehealth (HOSPITAL_COMMUNITY): Payer: Self-pay | Admitting: Physical Therapy

## 2016-11-22 ENCOUNTER — Ambulatory Visit (HOSPITAL_COMMUNITY): Payer: Medicare Other | Attending: Orthopaedic Surgery | Admitting: Physical Therapy

## 2016-11-22 NOTE — Telephone Encounter (Signed)
Called pt regarding missed appointment.  Pt requests to be discharged as he is doing well.   Rayetta Humphrey, [place] CLT [phone]

## 2016-11-25 ENCOUNTER — Ambulatory Visit (INDEPENDENT_AMBULATORY_CARE_PROVIDER_SITE_OTHER): Payer: Self-pay | Admitting: Otolaryngology

## 2016-12-02 ENCOUNTER — Emergency Department (HOSPITAL_COMMUNITY): Payer: Medicare Other

## 2016-12-02 ENCOUNTER — Emergency Department (HOSPITAL_COMMUNITY)
Admission: EM | Admit: 2016-12-02 | Discharge: 2016-12-02 | Disposition: A | Discharge status: Home / Self Care | Payer: Medicare Other | Attending: Emergency Medicine | Admitting: Emergency Medicine

## 2016-12-02 ENCOUNTER — Encounter (HOSPITAL_COMMUNITY): Payer: Self-pay | Admitting: Emergency Medicine

## 2016-12-02 DIAGNOSIS — W010XXA Fall on same level from slipping, tripping and stumbling without subsequent striking against object, initial encounter: Secondary | ICD-10-CM | POA: Insufficient documentation

## 2016-12-02 DIAGNOSIS — F17210 Nicotine dependence, cigarettes, uncomplicated: Secondary | ICD-10-CM | POA: Insufficient documentation

## 2016-12-02 DIAGNOSIS — Y929 Unspecified place or not applicable: Secondary | ICD-10-CM | POA: Diagnosis not present

## 2016-12-02 DIAGNOSIS — Z79899 Other long term (current) drug therapy: Secondary | ICD-10-CM | POA: Insufficient documentation

## 2016-12-02 DIAGNOSIS — S76012A Strain of muscle, fascia and tendon of left hip, initial encounter: Secondary | ICD-10-CM | POA: Diagnosis not present

## 2016-12-02 DIAGNOSIS — Y999 Unspecified external cause status: Secondary | ICD-10-CM | POA: Diagnosis not present

## 2016-12-02 DIAGNOSIS — Z7982 Long term (current) use of aspirin: Secondary | ICD-10-CM | POA: Diagnosis not present

## 2016-12-02 DIAGNOSIS — Z7984 Long term (current) use of oral hypoglycemic drugs: Secondary | ICD-10-CM | POA: Diagnosis not present

## 2016-12-02 DIAGNOSIS — E119 Type 2 diabetes mellitus without complications: Secondary | ICD-10-CM | POA: Diagnosis not present

## 2016-12-02 DIAGNOSIS — S7002XA Contusion of left hip, initial encounter: Secondary | ICD-10-CM

## 2016-12-02 DIAGNOSIS — Y9301 Activity, walking, marching and hiking: Secondary | ICD-10-CM | POA: Diagnosis not present

## 2016-12-02 DIAGNOSIS — I10 Essential (primary) hypertension: Secondary | ICD-10-CM | POA: Diagnosis not present

## 2016-12-02 DIAGNOSIS — S79912A Unspecified injury of left hip, initial encounter: Secondary | ICD-10-CM | POA: Diagnosis present

## 2016-12-02 MED ORDER — KETOROLAC TROMETHAMINE 10 MG PO TABS
10.0000 mg | ORAL_TABLET | Freq: Once | ORAL | Status: AC
  Administered 2016-12-02: 10 mg via ORAL
  Filled 2016-12-02: qty 1

## 2016-12-02 MED ORDER — CYCLOBENZAPRINE HCL 10 MG PO TABS: 10.0000 mg | ORAL_TABLET | Freq: Three times a day (TID) | ORAL | 0 refills | Status: DC

## 2016-12-02 MED ORDER — IBUPROFEN 600 MG PO TABS: 600.0000 mg | ORAL_TABLET | Freq: Four times a day (QID) | ORAL | 0 refills | Status: DC | PRN

## 2016-12-02 MED ORDER — CYCLOBENZAPRINE HCL 10 MG PO TABS
10.0000 mg | ORAL_TABLET | Freq: Once | ORAL | Status: AC
  Administered 2016-12-02: 10 mg via ORAL
  Filled 2016-12-02: qty 1

## 2016-12-02 NOTE — ED Provider Notes (Signed)
[facility] DEPT Provider Note   CSN: [id number] Arrival date & time: 12/02/16  1254  By signing my name below, I, Reola Mosher, attest that this documentation has been prepared under the direction and in the presence of Lily Kocher, PA-C.  Electronically Signed: Reola Mosher, ED Scribe. 12/02/16. 2:48 PM.  History   Chief Complaint Chief Complaint  Patient presents with  . Fall  . Hip Pain   The history is provided by the patient. No language interpreter was used.  Fall  This is a new problem. The current episode started 2 days ago. The problem occurs constantly. The problem has not changed since onset.The symptoms are aggravated by walking and standing. Nothing relieves the symptoms. He has tried nothing for the symptoms. The treatment provided no relief.    HPI Comments: [name] [name] is a 55 y.o. male who presents to the Emergency Department complaining of sudden onset, unchanged left hip pain onset 2 days ago s/p ground-level fall. He states that he was walking in the woods two nights ago when he slipped on a patch of acorns, causing him to fall and land on the side of his left hip. Pt notes that on 10/04/16 (approximately 2 months ago) that he had a total left hip arthroplasty performed. He has not contacted his surgeon since his fall to inform of this incident of his pain. Pt reports that his pain is exacerbated with weight bearing and ambulation. He additionally states that he has had to ambulate with his left foot turned inward as to avoid pain. No noted treatments were tried for his pain prior to coming into the ED. He denies weakness, numbness, or any other associated symptoms.   Past Medical History:  Diagnosis Date  . Acid reflux   . Alcohol abuse    6-8 cans of beer daily; hx of incarceration for DWI  . Arthritis   . Bipolar disorder ([place])   . Bronchitis   . Chronic back pain   . Chronic neck pain   . Chronic pain   . Diabetes mellitus without  complication ([place])   . Fracture of lower leg   . Gout   . Hyperlipidemia   . Hypertension   . Left arm pain    chronic  . Pancreatitis    March 2013  . Pseudocyst of pancreas 02/28/2012   Patient Active Problem List   Diagnosis Date Noted  . History of diabetes mellitus 10/10/2016  . Avascular necrosis of hip, left ([place]) 10/04/2016  . Status post left hip replacement 10/04/2016  . Rash and nonspecific skin eruption 08/12/2013  . Cholelithiases 08/04/2013  . Subcutaneous nodule 07/21/2013  . Acute alcoholic pancreatitis [id number]  . Syncope 04/11/2013  . Prolonged Q-T interval on ECG 04/11/2013  . Cocaine abuse 03/13/2013    Class: Chronic  . At risk for adverse drug event 02/14/2013  . DDD (degenerative disc disease), lumbar 02/01/2013  . Dyspepsia 12/01/2012  . BPH (benign prostatic hyperplasia) 10/07/2012  . Allergic rhinitis 10/03/2012  . Transaminitis 10/02/2012  . Chronic pain syndrome 10/02/2012  . Essential hypertension, benign 07/01/2012  . Tobacco user 07/01/2012  . Hepatic steatosis 03/06/2012  . ED (erectile dysfunction) 03/06/2012  . Pseudocyst of pancreas 02/28/2012  . Alcohol abuse 02/25/2012  . GERD (gastroesophageal reflux disease) 02/23/2012  . Bipolar 1 disorder ([place]) 02/23/2012  . Hypertriglyceridemia 12/05/2011   Past Surgical History:  Procedure Laterality Date  . CIRCUMCISION  03/17/2012   Procedure: CIRCUMCISION ADULT;  Surgeon: Silvano Rusk  Michela Pitcher, MD;  Location: [facility];  Service: Urology;  Laterality: N/A;  . COLONOSCOPY WITH PROPOFOL  12/24/2012   [id number] lesion as described above status post biopsy; otherwise normal rectum/Sigmoid diverticulosis/Sigmoid polyps -- resected as described above. anal lesion, benign. colon polyp, hyperplastic  . ESOPHAGOGASTRODUODENOSCOPY (EGD) WITH PROPOFOL  12/24/2012   [id number] erythema and erosions of uncertain significance status post gastric biopsy (reactive gastropathy NO h.pylori)  . NOSE SURGERY     broken  nose  . TOTAL HIP ARTHROPLASTY Left 10/04/2016   Procedure: LEFT TOTAL HIP ARTHROPLASTY ANTERIOR APPROACH;  Surgeon: Mcarthur Rossetti, MD;  Location: [facility];  Service: Orthopedics;  Laterality: Left;    Home Medications    Prior to Admission medications   Medication Sig Start Date End Date Taking? Authorizing Provider  amLODipine (NORVASC) 5 MG tablet Take 1 tablet (5 mg total) by mouth daily. For high blood pressure control 07/21/13  Yes Alycia Rossetti, MD  aspirin EC 325 MG EC tablet Take 1 tablet (325 mg total) by mouth 2 (two) times daily after a meal. 10/06/16  Yes Mcarthur Rossetti, MD  fluticasone [facility]) 50 MCG/ACT nasal spray Place 1 spray into both nostrils 2 (two) times daily.   Yes Historical Provider, MD  gabapentin (NEURONTIN) 300 MG capsule Take 300 mg by mouth 3 (three) times daily.  10/24/16  Yes Historical Provider, MD  gemfibrozil (LOPID) 600 MG tablet Take 1 tablet (600 mg total) by mouth 2 (two) times daily before a meal. Cholesterol control 07/21/13  Yes Alycia Rossetti, MD  lisinopril (PRINIVIL,ZESTRIL) 5 MG tablet Take 5 mg by mouth daily.   Yes Historical Provider, MD  metFORMIN (GLUCOPHAGE) 1000 MG tablet Take 1,000 mg by mouth daily with breakfast.   Yes Historical Provider, MD  Multiple Vitamin (MULTIVITAMIN WITH MINERALS) TABS Take 1 tablet by mouth daily.   Yes Historical Provider, MD  omeprazole (PRILOSEC) 20 MG capsule Take 1 capsule (20 mg total) by mouth daily. For acid reflux 07/21/13  Yes Alycia Rossetti, MD  tamsulosin (FLOMAX) 0.4 MG CAPS Take 1 capsule (0.4 mg total) by mouth daily. For enlarged prostate 07/21/13  Yes Alycia Rossetti, MD  methocarbamol (ROBAXIN) 500 MG tablet Take 1 tablet (500 mg total) by mouth 2 (two) times daily. Patient not taking: Reported on 12/02/2016 09/02/16   Fransico Meadow, PA-C  naproxen (NAPROSYN) 500 MG tablet Take 1 tablet (500 mg total) by mouth 2 (two) times daily. Patient not taking: Reported on 12/02/2016  09/29/16   Tanna Furry, MD   Family History Family History  Problem Relation Age of Onset  . Diabetes Father   . Anesthesia problems Neg Hx   . Hypotension Neg Hx   . Malignant hyperthermia Neg Hx   . Pseudochol deficiency Neg Hx   . Colon cancer Neg Hx   . Heart disease Neg Hx   . Stroke Neg Hx   . Cancer Neg Hx    Social History Social History  Substance Use Topics  . Smoking status: Current Every Day Smoker    Packs/day: 0.50    Years: 25.00    Types: Cigarettes  . Smokeless tobacco: Former Systems developer    Types: Chew  . Alcohol use 0.0 oz/week     Comment: 2-3 beers daily   Allergies   Penicillins  Review of Systems Review of Systems  Musculoskeletal: Positive for arthralgias (left hip), gait problem and myalgias.  Neurological: Negative for weakness and numbness.  All other systems reviewed  and are negative.  Physical Exam Updated Vital Signs BP 125/91 (BP Location: Left Arm)   Pulse 80   Temp 98.8 F (37.1 C) (Oral)   Resp 20   Ht 5\' 10"  (1.778 m)   Wt 204 lb (92.5 kg)   SpO2 98%   BMI 29.27 kg/m   Physical Exam  Constitutional: He appears well-developed and well-nourished. No distress.  HENT:  Head: Normocephalic and atraumatic.  Eyes: Conjunctivae are normal.  Neck: Normal range of motion.  Cardiovascular: Normal rate.   Pulmonary/Chest: Effort normal.  Abdominal: He exhibits no distension.  Musculoskeletal: He exhibits tenderness.  There is soreness and palpation on ROM of the left hip. Pain is mostly in the lateral and posterior portion of the left hip. Good ROM of the left knee and left ankle. Sensation is intact. Tenderness to palpation of the tibial area. No hematoma, or palpable deformity. No swelling of the leg. The lower leg is not hot.   Neurological: He is alert.  Skin: No pallor.  Psychiatric: He has a normal mood and affect. His behavior is normal.  Nursing note and vitals reviewed.  ED Treatments / Results  DIAGNOSTIC STUDIES: Oxygen  Saturation is 98% on RA, normal by my interpretation.   COORDINATION OF CARE: 2:47 PM-Discussed next steps with pt. Pt verbalized understanding and is agreeable with the plan.   Radiology Dg Hip Unilat W Or Wo Pelvis 2-3 Views Left  Result Date: 12/02/2016 CLINICAL DATA:  Fall last week with left-sided hip pain. History of hip arthroplasty on 10/04/2016. EXAM: DG HIP (WITH OR WITHOUT PELVIS) 2-3V LEFT COMPARISON:  10/04/2016 FINDINGS: No evidence of acute hip or pelvic fracture. No diastasis. Left hip arthroplasty shows stable positioning and appearance. No abnormal lucency surrounding hardware. No evidence of arthroplasty dislocation. Surrounding soft tissues are unremarkable. IMPRESSION: No acute findings. Stable appearance of left hip arthroplasty. Electronically Signed   By: Aletta Edouard M.D.   On: 12/02/2016 14:31   Procedures Procedures   Medications Ordered in ED Medications - No data to display  Initial Impression / Assessment and Plan / ED Course  I have reviewed the triage vital signs and the nursing notes.  Pertinent labs & imaging results that were available during my care of the patient were reviewed by me and considered in my medical decision making (see chart for details).  Clinical Course    MDM No shortening of the left lower extremity. No neurological or vascular deficit is appreciated. XR is negative for fx or dislocation. Surgical hardware is in place. Suspect contusion and strain of the left hip following a fall. Pt will be treated with Flexeril and Ibuprofen for pain. He is to f/u w/ his Orthopedic surgeon in their office within the near future.   Final Clinical Impressions(s) / ED Diagnoses   Final diagnoses:  Hip strain, left, initial encounter  Contusion of left hip, initial encounter   New Prescriptions New Prescriptions   No medications on file   **I personally performed the services described in this documentation, which was scribed in my  presence. The recorded information has been reviewed and is accurate.Lily Kocher, PA-C 12/03/16 [place], MD 12/06/16 [phone]

## 2016-12-02 NOTE — ED Triage Notes (Signed)
Patient states he fell 2 days ago and is complaining of left hip pain radiating down left leg. Patient ambulatory at triage. States he had hip replacement Oct 04, 2016.

## 2016-12-02 NOTE — Discharge Instructions (Signed)
Your xray is negative for fracture or dislocation. Please use flexeril and ibuprofen for hip strain and contusion. See your surgeon as soon as possible.

## 2016-12-03 ENCOUNTER — Telehealth (INDEPENDENT_AMBULATORY_CARE_PROVIDER_SITE_OTHER): Payer: Self-pay | Admitting: Orthopaedic Surgery

## 2016-12-03 NOTE — Telephone Encounter (Signed)
Patient called advised he fell last Thursday and went to [facility]. Patient said he was told he had a sprained left hip. (Initial encounter Lt Hip contusion).  Patient want to know if he should make an appointment to see Dr Ninfa Linden. Patient said he is taking Ibuprofen and Flexeril.  The number to contact him is [phone]

## 2016-12-03 NOTE — Telephone Encounter (Signed)
If he continues to have pain, than he should make an appointment

## 2016-12-05 NOTE — Telephone Encounter (Signed)
Spoke with patient read note to him concerning if pain continues he should make an appointment. Patient said if he is still having pain Monday he will make an appointment.

## 2016-12-12 ENCOUNTER — Ambulatory Visit (INDEPENDENT_AMBULATORY_CARE_PROVIDER_SITE_OTHER): Payer: Medicare Other | Admitting: Otolaryngology

## 2016-12-12 DIAGNOSIS — J343 Hypertrophy of nasal turbinates: Secondary | ICD-10-CM | POA: Diagnosis not present

## 2016-12-12 DIAGNOSIS — J342 Deviated nasal septum: Secondary | ICD-10-CM | POA: Diagnosis not present

## 2016-12-12 DIAGNOSIS — J310 Chronic rhinitis: Secondary | ICD-10-CM | POA: Diagnosis not present

## 2017-01-02 ENCOUNTER — Ambulatory Visit (INDEPENDENT_AMBULATORY_CARE_PROVIDER_SITE_OTHER): Payer: PPO | Admitting: Otolaryngology

## 2017-01-02 DIAGNOSIS — J342 Deviated nasal septum: Secondary | ICD-10-CM

## 2017-01-02 DIAGNOSIS — J343 Hypertrophy of nasal turbinates: Secondary | ICD-10-CM

## 2017-01-02 DIAGNOSIS — J310 Chronic rhinitis: Secondary | ICD-10-CM

## 2017-01-03 ENCOUNTER — Other Ambulatory Visit (INDEPENDENT_AMBULATORY_CARE_PROVIDER_SITE_OTHER): Payer: Self-pay | Admitting: Otolaryngology

## 2017-01-03 DIAGNOSIS — J328 Other chronic sinusitis: Secondary | ICD-10-CM

## 2017-01-08 ENCOUNTER — Ambulatory Visit (HOSPITAL_COMMUNITY): Payer: PPO

## 2017-01-08 ENCOUNTER — Encounter (HOSPITAL_COMMUNITY): Payer: Self-pay

## 2017-01-15 ENCOUNTER — Ambulatory Visit (HOSPITAL_COMMUNITY): Payer: PPO

## 2017-02-19 ENCOUNTER — Emergency Department (HOSPITAL_COMMUNITY): Payer: PPO

## 2017-02-19 ENCOUNTER — Emergency Department (HOSPITAL_COMMUNITY)
Admission: EM | Admit: 2017-02-19 | Discharge: 2017-02-19 | Disposition: A | Discharge status: Home Health | Payer: PPO | Attending: Emergency Medicine | Admitting: Emergency Medicine

## 2017-02-19 ENCOUNTER — Encounter (HOSPITAL_COMMUNITY): Payer: Self-pay | Admitting: Emergency Medicine

## 2017-02-19 DIAGNOSIS — R61 Generalized hyperhidrosis: Secondary | ICD-10-CM | POA: Diagnosis not present

## 2017-02-19 DIAGNOSIS — M549 Dorsalgia, unspecified: Secondary | ICD-10-CM | POA: Diagnosis not present

## 2017-02-19 DIAGNOSIS — E119 Type 2 diabetes mellitus without complications: Secondary | ICD-10-CM | POA: Diagnosis not present

## 2017-02-19 DIAGNOSIS — R42 Dizziness and giddiness: Secondary | ICD-10-CM | POA: Diagnosis not present

## 2017-02-19 DIAGNOSIS — Z7982 Long term (current) use of aspirin: Secondary | ICD-10-CM | POA: Diagnosis not present

## 2017-02-19 DIAGNOSIS — M542 Cervicalgia: Secondary | ICD-10-CM | POA: Diagnosis not present

## 2017-02-19 DIAGNOSIS — F17210 Nicotine dependence, cigarettes, uncomplicated: Secondary | ICD-10-CM | POA: Insufficient documentation

## 2017-02-19 DIAGNOSIS — I10 Essential (primary) hypertension: Secondary | ICD-10-CM | POA: Diagnosis not present

## 2017-02-19 DIAGNOSIS — Z79899 Other long term (current) drug therapy: Secondary | ICD-10-CM | POA: Diagnosis not present

## 2017-02-19 DIAGNOSIS — Z7984 Long term (current) use of oral hypoglycemic drugs: Secondary | ICD-10-CM | POA: Insufficient documentation

## 2017-02-19 DIAGNOSIS — R0989 Other specified symptoms and signs involving the circulatory and respiratory systems: Secondary | ICD-10-CM | POA: Diagnosis not present

## 2017-02-19 LAB — CBG MONITORING, ED: Glucose-Capillary: 105 mg/dL — ABNORMAL HIGH (ref 65–99)

## 2017-02-19 MED ORDER — ACETAMINOPHEN 325 MG PO TABS
650.0000 mg | ORAL_TABLET | Freq: Once | ORAL | Status: AC
  Administered 2017-02-19: 650 mg via ORAL
  Filled 2017-02-19: qty 2

## 2017-02-19 MED ORDER — IBUPROFEN 400 MG PO TABS
400.0000 mg | ORAL_TABLET | Freq: Once | ORAL | Status: AC
  Administered 2017-02-19: 400 mg via ORAL
  Filled 2017-02-19: qty 1

## 2017-02-19 MED ORDER — METFORMIN HCL 500 MG PO TABS
1000.0000 mg | ORAL_TABLET | Freq: Once | ORAL | Status: AC
  Administered 2017-02-19: 1000 mg via ORAL
  Filled 2017-02-19: qty 2

## 2017-02-19 NOTE — ED Provider Notes (Signed)
[facility] DEPT Provider Note   CSN: [id number] Arrival date & time: 02/19/17  1310     History   Chief Complaint Chief Complaint  Patient presents with  . Night Sweats    one week    HPI [name] [name] is a 56 y.o. male.  Patient is a 56 year old male who presents to the emergency department with a complaint of night sweats.  The patient states that over the past few weeks he has been having sweating mostly at night. But he states that he also notices some sweats when he does even a minimal amount of work outside. He states that he feels as though he has to change his T-shirt when others are just very comfortable. The patient denies any unusual chest pain, or shortness of breath. He is not had any hemoptysis reported. He's never been diagnosed with tuberculosis. He has no known cancer issues. He does admit to using alcohol on a normal state basis. It is of note that he is diabetic, he states however he has not had his medication over the last several days because he could not afford it until he gets paid this Friday. He has not been checking his blood sugars recently either. He presents now for evaluation concerning these sweats.      Past Medical History:  Diagnosis Date  . Acid reflux   . Alcohol abuse    6-8 cans of beer daily; hx of incarceration for DWI  . Arthritis   . Bipolar disorder ([place])   . Bronchitis   . Chronic back pain   . Chronic neck pain   . Chronic pain   . Diabetes mellitus without complication ([place])   . Fracture of lower leg   . Gout   . Hyperlipidemia   . Hypertension   . Left arm pain    chronic  . Pancreatitis    March 2013  . Pseudocyst of pancreas 02/28/2012    Patient Active Problem List   Diagnosis Date Noted  . History of diabetes mellitus 10/10/2016  . Avascular necrosis of hip, left ([place]) 10/04/2016  . Status post left hip replacement 10/04/2016  . Rash and nonspecific skin eruption 08/12/2013  . Cholelithiases 08/04/2013  .  Subcutaneous nodule 07/21/2013  . Acute alcoholic pancreatitis [id number]  . Syncope 04/11/2013  . Prolonged Q-T interval on ECG 04/11/2013  . Cocaine abuse 03/13/2013    Class: Chronic  . At risk for adverse drug event 02/14/2013  . DDD (degenerative disc disease), lumbar 02/01/2013  . Dyspepsia 12/01/2012  . BPH (benign prostatic hyperplasia) 10/07/2012  . Allergic rhinitis 10/03/2012  . Transaminitis 10/02/2012  . Chronic pain syndrome 10/02/2012  . Essential hypertension, benign 07/01/2012  . Tobacco user 07/01/2012  . Hepatic steatosis 03/06/2012  . ED (erectile dysfunction) 03/06/2012  . Pseudocyst of pancreas 02/28/2012  . Alcohol abuse 02/25/2012  . GERD (gastroesophageal reflux disease) 02/23/2012  . Bipolar 1 disorder ([place]) 02/23/2012  . Hypertriglyceridemia 12/05/2011    Past Surgical History:  Procedure Laterality Date  . CIRCUMCISION  03/17/2012   Procedure: CIRCUMCISION ADULT;  Surgeon: Marissa Nestle, MD;  Location: [facility];  Service: Urology;  Laterality: N/A;  . COLONOSCOPY WITH PROPOFOL  12/24/2012   [id number] lesion as described above status post biopsy; otherwise normal rectum/Sigmoid diverticulosis/Sigmoid polyps -- resected as described above. anal lesion, benign. colon polyp, hyperplastic  . ESOPHAGOGASTRODUODENOSCOPY (EGD) WITH PROPOFOL  12/24/2012   [id number] erythema and erosions of uncertain significance status post gastric biopsy (reactive gastropathy NO  h.pylori)  . NOSE SURGERY     broken nose  . TOTAL HIP ARTHROPLASTY Left 10/04/2016   Procedure: LEFT TOTAL HIP ARTHROPLASTY ANTERIOR APPROACH;  Surgeon: Mcarthur Rossetti, MD;  Location: [facility];  Service: Orthopedics;  Laterality: Left;       Home Medications    Prior to Admission medications   Medication Sig Start Date End Date Taking? Authorizing Provider  amLODipine (NORVASC) 5 MG tablet Take 1 tablet (5 mg total) by mouth daily. For high blood pressure control 07/21/13  Yes Alycia Rossetti, MD  aspirin EC 325 MG EC tablet Take 1 tablet (325 mg total) by mouth 2 (two) times daily after a meal. 10/06/16  Yes Mcarthur Rossetti, MD  cyclobenzaprine (FLEXERIL) 10 MG tablet Take 1 tablet (10 mg total) by mouth 3 (three) times daily. 12/02/16  Yes Lily Kocher, PA-C  fluticasone (FLONASE) 50 MCG/ACT nasal spray Place 1 spray into both nostrils 2 (two) times daily.   Yes Historical Provider, MD  gabapentin (NEURONTIN) 300 MG capsule Take 300 mg by mouth 3 (three) times daily.  10/24/16  Yes Historical Provider, MD  ibuprofen (ADVIL,MOTRIN) 600 MG tablet Take 1 tablet (600 mg total) by mouth every 6 (six) hours as needed. 12/02/16  Yes Lily Kocher, PA-C  lisinopril (PRINIVIL,ZESTRIL) 5 MG tablet Take 5 mg by mouth daily.   Yes Historical Provider, MD  lovastatin (MEVACOR) 40 MG tablet Take 1 tablet by mouth daily. 01/28/17  Yes Historical Provider, MD  metFORMIN (GLUCOPHAGE) 1000 MG tablet Take 1,000 mg by mouth daily with breakfast.   Yes Historical Provider, MD  Multiple Vitamin (MULTIVITAMIN WITH MINERALS) TABS Take 1 tablet by mouth daily.   Yes Historical Provider, MD  naproxen (NAPROSYN) 500 MG tablet Take 1 tablet (500 mg total) by mouth 2 (two) times daily. 09/29/16  Yes Tanna Furry, MD  omeprazole (PRILOSEC) 20 MG capsule Take 1 capsule (20 mg total) by mouth daily. For acid reflux 07/21/13  Yes Alycia Rossetti, MD  tamsulosin (FLOMAX) 0.4 MG CAPS Take 1 capsule (0.4 mg total) by mouth daily. For enlarged prostate 07/21/13  Yes Alycia Rossetti, MD  gemfibrozil (LOPID) 600 MG tablet Take 1 tablet (600 mg total) by mouth 2 (two) times daily before a meal. Cholesterol control Patient not taking: Reported on 02/19/2017 07/21/13   Alycia Rossetti, MD  methocarbamol (ROBAXIN) 500 MG tablet Take 1 tablet (500 mg total) by mouth 2 (two) times daily. Patient not taking: Reported on 02/19/2017 09/02/16   Fransico Meadow, PA-C    Family History Family History  Problem Relation Age of  Onset  . Diabetes Father   . Anesthesia problems Neg Hx   . Hypotension Neg Hx   . Malignant hyperthermia Neg Hx   . Pseudochol deficiency Neg Hx   . Colon cancer Neg Hx   . Heart disease Neg Hx   . Stroke Neg Hx   . Cancer Neg Hx     Social History Social History  Substance Use Topics  . Smoking status: Current Every Day Smoker    Packs/day: 0.50    Years: 25.00    Types: Cigarettes  . Smokeless tobacco: Former Systems developer    Types: Chew  . Alcohol use 0.0 oz/week     Comment: 2-3 beers daily     Allergies   Penicillins   Review of Systems Review of Systems  Constitutional: Positive for diaphoresis. Negative for appetite change, chills and fever.  Gastrointestinal: Negative for  abdominal pain, diarrhea, nausea and vomiting.  Endocrine: Positive for heat intolerance.  Musculoskeletal: Positive for arthralgias, back pain and neck pain.  Neurological: Positive for light-headedness. Negative for syncope.  All other systems reviewed and are negative.    Physical Exam Updated Vital Signs BP 117/83 (BP Location: Left Arm)   Pulse 93   Temp 98.8 F (37.1 C) (Oral)   Resp 18   Ht 5\' 10"  (1.778 m)   Wt 91.3 kg   SpO2 100%   BMI 28.87 kg/m   Physical Exam  Constitutional: He is oriented to person, place, and time. He appears well-developed and well-nourished.  Non-toxic appearance.  HENT:  Head: Normocephalic.  Right Ear: Tympanic membrane and external ear normal.  Left Ear: Tympanic membrane and external ear normal.  Eyes: EOM and lids are normal. Pupils are equal, round, and reactive to light.  Neck: Normal range of motion. Neck supple. Carotid bruit is not present.  Cardiovascular: Normal rate, regular rhythm, normal heart sounds, intact distal pulses and normal pulses.   Pulmonary/Chest: Breath sounds normal. No respiratory distress.  Abdominal: Soft. Bowel sounds are normal. There is no tenderness. There is no guarding.  Musculoskeletal: Normal range of motion.        Cervical back: He exhibits pain.       Lumbar back: He exhibits pain.  Lymphadenopathy:       Head (right side): No submandibular adenopathy present.       Head (left side): No submandibular adenopathy present.    He has no cervical adenopathy.  Neurological: He is alert and oriented to person, place, and time. He has normal strength. No cranial nerve deficit or sensory deficit.  Skin: Skin is warm and dry.  Psychiatric: He has a normal mood and affect. His speech is normal.  Nursing note and vitals reviewed.    ED Treatments / Results  Labs (all labs ordered are listed, but only abnormal results are displayed) Labs Reviewed  CBG MONITORING, ED - Abnormal; Notable for the following:       Result Value   Glucose-Capillary 105 (*)    All other components within normal limits    EKG  EKG Interpretation None       Radiology Dg Chest 2 View  Result Date: 02/19/2017 CLINICAL DATA:  Congestion and night sweats EXAM: CHEST  2 VIEW COMPARISON:  August 17, 2013 FINDINGS: There is no edema or consolidation. Heart size and pulmonary vascularity are normal. No adenopathy. No bone lesions. IMPRESSION: No edema or consolidation. Electronically Signed   By: Lowella Grip III M.D.   On: 02/19/2017 14:52    Procedures Procedures (including critical care time)  Medications Ordered in ED Medications  metFORMIN (GLUCOPHAGE) tablet 1,000 mg (not administered)  acetaminophen (TYLENOL) tablet 650 mg (not administered)  ibuprofen (ADVIL,MOTRIN) tablet 400 mg (not administered)     Initial Impression / Assessment and Plan / ED Course  I have reviewed the triage vital signs and the nursing notes.  Pertinent labs & imaging results that were available during my care of the patient were reviewed by me and considered in my medical decision making (see chart for details).     *I have reviewed nursing notes, vital signs, and all appropriate lab and imaging results for this  patient.**  Final Clinical Impressions(s) / ED Diagnoses MDM Vital signs within normal limits. Glucose is normal at 105. Chest x-ray shows no edema, no consolidation. There is no evidence of tuberculosis, and no evidence of lung  abscess. Question if the sweats might be related to a viral illness. Pt to see MD at [facility] to complete the workup of the sweats   Final diagnoses:  Night sweats    New Prescriptions New Prescriptions   No medications on file     Lily Kocher, PA-C 02/20/17 0945    Sherwood Gambler, MD 02/20/17 2134

## 2017-02-19 NOTE — Discharge Instructions (Signed)
Your vital signs within normal limits. Your pulse oximetry is 100% on room air. Within normal limits by my interpretation. Your blood sugar is within normal limits. Your chest x-ray does not show evidence of abscess or tuberculosis. This could possibly be related to a viral illness. Please see the physicians at the [facility] clinic, or the [facility] clinic to finish the workup concerning your sweats. Please use Tylenol every 4 hours and ibuprofen every 6 hours for fever, chills, or aching.

## 2017-02-19 NOTE — ED Triage Notes (Signed)
Having night sweats for one week and got light headed after standing to walk.  Denies any chest pain ofr discomfort.

## 2017-03-04 ENCOUNTER — Ambulatory Visit (HOSPITAL_COMMUNITY): Payer: Medicare Other

## 2017-03-11 ENCOUNTER — Ambulatory Visit (HOSPITAL_COMMUNITY)
Admission: RE | Admit: 2017-03-11 | Discharge: 2017-03-11 | Disposition: A | Discharge status: Home / Self Care | Payer: Medicare Other | Source: Ambulatory Visit | Attending: Otolaryngology | Admitting: Otolaryngology

## 2017-03-11 DIAGNOSIS — J342 Deviated nasal septum: Secondary | ICD-10-CM | POA: Diagnosis not present

## 2017-03-11 DIAGNOSIS — J328 Other chronic sinusitis: Secondary | ICD-10-CM

## 2017-04-07 ENCOUNTER — Ambulatory Visit (INDEPENDENT_AMBULATORY_CARE_PROVIDER_SITE_OTHER): Payer: Medicare Other | Admitting: Otolaryngology

## 2017-04-07 DIAGNOSIS — J342 Deviated nasal septum: Secondary | ICD-10-CM

## 2017-04-07 DIAGNOSIS — J343 Hypertrophy of nasal turbinates: Secondary | ICD-10-CM | POA: Diagnosis not present

## 2017-04-07 DIAGNOSIS — J310 Chronic rhinitis: Secondary | ICD-10-CM | POA: Diagnosis not present

## 2017-04-18 ENCOUNTER — Other Ambulatory Visit: Payer: Self-pay | Admitting: Orthopedic Surgery

## 2017-05-12 ENCOUNTER — Other Ambulatory Visit: Payer: Self-pay | Admitting: Orthopedic Surgery

## 2017-07-08 ENCOUNTER — Emergency Department (HOSPITAL_COMMUNITY)
Admission: EM | Admit: 2017-07-08 | Discharge: 2017-07-08 | Disposition: A | Discharge status: Home / Self Care | Payer: Medicare Other | Attending: Emergency Medicine | Admitting: Emergency Medicine

## 2017-07-08 ENCOUNTER — Encounter (HOSPITAL_COMMUNITY): Payer: Self-pay | Admitting: Emergency Medicine

## 2017-07-08 DIAGNOSIS — E119 Type 2 diabetes mellitus without complications: Secondary | ICD-10-CM | POA: Diagnosis not present

## 2017-07-08 DIAGNOSIS — M545 Low back pain: Secondary | ICD-10-CM | POA: Diagnosis present

## 2017-07-08 DIAGNOSIS — I10 Essential (primary) hypertension: Secondary | ICD-10-CM | POA: Diagnosis not present

## 2017-07-08 DIAGNOSIS — F1410 Cocaine abuse, uncomplicated: Secondary | ICD-10-CM | POA: Diagnosis not present

## 2017-07-08 DIAGNOSIS — F1010 Alcohol abuse, uncomplicated: Secondary | ICD-10-CM | POA: Diagnosis not present

## 2017-07-08 DIAGNOSIS — F17210 Nicotine dependence, cigarettes, uncomplicated: Secondary | ICD-10-CM | POA: Insufficient documentation

## 2017-07-08 DIAGNOSIS — Z96642 Presence of left artificial hip joint: Secondary | ICD-10-CM | POA: Insufficient documentation

## 2017-07-08 DIAGNOSIS — G8929 Other chronic pain: Secondary | ICD-10-CM | POA: Diagnosis not present

## 2017-07-08 MED ORDER — IBUPROFEN 800 MG PO TABS
800.0000 mg | ORAL_TABLET | Freq: Once | ORAL | Status: AC
  Administered 2017-07-08: 800 mg via ORAL
  Filled 2017-07-08: qty 1

## 2017-07-08 MED ORDER — IBUPROFEN 800 MG PO TABS: 800.0000 mg | ORAL_TABLET | Freq: Three times a day (TID) | ORAL | 0 refills | Status: DC | PRN

## 2017-07-08 NOTE — ED Notes (Addendum)
Pt ripped off ibuprofen prescription and dropped discharges paperwork in floor on the way out of ED after talking with EDP. Pt ambulated out of ED with steady gait. nad noted.  Pt left prior to signing e-signature and obtaining discharge vital signs.

## 2017-07-08 NOTE — ED Triage Notes (Signed)
Patient complaining of lower back pain since Friday. Denies injury. States he has history of back pain. Patient admits to drinking "one shot of liquor and beer" this morning.

## 2017-07-08 NOTE — ED Notes (Signed)
Reviewed discharge instructions with patient. Pt reports "cant you help me with alcohol detox". EDP consulted and reported provided pt with outpatient referrals and resident programs. Pt aware and reports "i need a ride,cant you call the sheriff." Pt informed is being discharged with locations of treatment facilities and the process for outpatient programs. Pt reports " I want to speak to the doctor again". EDP at bedside. Moderate agitation noted by patient at this time.

## 2017-07-08 NOTE — ED Provider Notes (Signed)
Emergency Department Provider Note   I have reviewed the triage vital signs and the nursing notes.   HISTORY  Chief Complaint Back Pain   HPI [name] [name] is a 56 y.o. male with PMH of polysubstance abuse and chronic lower back pain presents to the emergency department for evaluation of worsening lower back pain in the setting of mechanical fall last night. The patient states he was walking down some steps when he lost his footing and fell forward. He denies any head trauma or loss of consciousness. The patient states he been drinking heavily and using cocaine. He noticed this morning that he had worsening lower back pain and so presented to the emergency department. Patient states that he is interested in going to detox. He has been actively drinking this morning. States his back pain feels slightly worse than normal but no new features. No radiation down the legs. No weakness or numbness. No bowel or bladder incontinence. No fevers or chills. No radiation of symptoms.    Past Medical History:  Diagnosis Date  . Acid reflux   . Alcohol abuse    6-8 cans of beer daily; hx of incarceration for DWI  . Arthritis   . Bipolar disorder ([place])   . Bronchitis   . Chronic back pain   . Chronic neck pain   . Chronic pain   . Diabetes mellitus without complication ([place])   . Fracture of lower leg   . Gout   . Hyperlipidemia   . Hypertension   . Left arm pain    chronic  . Pancreatitis    March 2013  . Pseudocyst of pancreas 02/28/2012    Patient Active Problem List   Diagnosis Date Noted  . History of diabetes mellitus 10/10/2016  . Avascular necrosis of hip, left ([place]) 10/04/2016  . Status post left hip replacement 10/04/2016  . Rash and nonspecific skin eruption 08/12/2013  . Cholelithiases 08/04/2013  . Subcutaneous nodule 07/21/2013  . Acute alcoholic pancreatitis 56/81/2751  . Syncope 04/11/2013  . Prolonged Q-T interval on ECG 04/11/2013  . Cocaine abuse 03/13/2013   Class: Chronic  . At risk for adverse drug event 02/14/2013  . DDD (degenerative disc disease), lumbar 02/01/2013  . Dyspepsia 12/01/2012  . BPH (benign prostatic hyperplasia) 10/07/2012  . Allergic rhinitis 10/03/2012  . Transaminitis 10/02/2012  . Chronic pain syndrome 10/02/2012  . Essential hypertension, benign 07/01/2012  . Tobacco user 07/01/2012  . Hepatic steatosis 03/06/2012  . ED (erectile dysfunction) 03/06/2012  . Pseudocyst of pancreas 02/28/2012  . Alcohol abuse 02/25/2012  . GERD (gastroesophageal reflux disease) 02/23/2012  . Bipolar 1 disorder ([place]) 02/23/2012  . Hypertriglyceridemia 12/05/2011    Past Surgical History:  Procedure Laterality Date  . CIRCUMCISION  03/17/2012   Procedure: CIRCUMCISION ADULT;  Surgeon: Marissa Nestle, MD;  Location: [facility];  Service: Urology;  Laterality: N/A;  . COLONOSCOPY WITH PROPOFOL  12/24/2012   [id number] lesion as described above status post biopsy; otherwise normal rectum/Sigmoid diverticulosis/Sigmoid polyps -- resected as described above. anal lesion, benign. colon polyp, hyperplastic  . ESOPHAGOGASTRODUODENOSCOPY (EGD) WITH PROPOFOL  12/24/2012   [id number] erythema and erosions of uncertain significance status post gastric biopsy (reactive gastropathy NO h.pylori)  . NOSE SURGERY     broken nose  . TOTAL HIP ARTHROPLASTY Left 10/04/2016   Procedure: LEFT TOTAL HIP ARTHROPLASTY ANTERIOR APPROACH;  Surgeon: Mcarthur Rossetti, MD;  Location: [facility];  Service: Orthopedics;  Laterality: Left;    Current Outpatient Rx  .  Order #: [id number] Class: Normal  . Order #: [id number] Class: Print  . Order #: [id number] Class: Print  . Order #: [id number] Class: Historical Med  . Order #: [id number] Class: Normal  . Order #: [id number] Class: No Print  . Order #: [id number] Class: Print  . Order #: [id number] Class: Historical Med  . Order #: [id number] Class: Historical Med  . Order #: [id number] Class: Historical Med  . Order #: [id number] Class:  Print  . Order #: [id number] Class: Historical Med  . Order #: [id number] Class: Print  . Order #: [id number] Class: Normal  . Order #: [id number] Class: No Print    Allergies Penicillins  Family History  Problem Relation Age of Onset  . Diabetes Father   . Anesthesia problems Neg Hx   . Hypotension Neg Hx   . Malignant hyperthermia Neg Hx   . Pseudochol deficiency Neg Hx   . Colon cancer Neg Hx   . Heart disease Neg Hx   . Stroke Neg Hx   . Cancer Neg Hx     Social History Social History  Substance Use Topics  . Smoking status: Current Every Day Smoker    Packs/day: 0.50    Years: 25.00    Types: Cigarettes  . Smokeless tobacco: Former Systems developer    Types: Chew  . Alcohol use 0.0 oz/week     Comment: "about a 6 pack a day"    Review of Systems  Constitutional: No fever/chills Eyes: No visual changes. ENT: No sore throat. Cardiovascular: Denies chest pain. Respiratory: Denies shortness of breath. Gastrointestinal: No abdominal pain.  No nausea, no vomiting.  No diarrhea.  No constipation. Genitourinary: Negative for dysuria. Musculoskeletal: Positive for back pain. Skin: Negative for rash. Neurological: Negative for headaches, focal weakness or numbness.  10-point ROS otherwise negative.  ____________________________________________   PHYSICAL EXAM:  VITAL SIGNS: ED Triage Vitals  Enc Vitals Group     BP 07/08/17 0738 131/81     Pulse Rate 07/08/17 0738 86     Resp 07/08/17 0738 20     Temp 07/08/17 0738 97.8 F (36.6 C)     Temp Source 07/08/17 0738 Oral     SpO2 07/08/17 0738 93 %     Weight 07/08/17 0739 200 lb (90.7 kg)     Height 07/08/17 0739 5\' 9"  (1.753 m)     Pain Score 07/08/17 0735 8   Constitutional: Alert and oriented. Well appearing and in no acute distress. Eyes: Conjunctivae are normal.  Head: Atraumatic. Nose: No congestion/rhinnorhea. Mouth/Throat: Mucous membranes are moist.   Neck: No stridor. No cervical spine tenderness to  palpation. Cardiovascular: Normal rate, regular rhythm. Good peripheral circulation. Grossly normal heart sounds.   Respiratory: Normal respiratory effort.  No retractions. Lungs CTAB. Gastrointestinal: Soft and nontender. No distention.  Musculoskeletal: No lower extremity tenderness nor edema. No gross deformities of extremities. No midline thoracic or lumbar spine tenderness to palpation.  Neurologic:  Normal speech and language. No gross focal neurologic deficits are appreciated. 2+ patellar reflexes bilaterally.  Skin:  Skin is warm, dry and intact. No rash noted.  ____________________________________________  RADIOLOGY  None ____________________________________________   PROCEDURES  Procedure(s) performed:   Procedures  None ____________________________________________   INITIAL IMPRESSION / ASSESSMENT AND PLAN / ED COURSE  Pertinent labs & imaging results that were available during my care of the patient were reviewed by me and considered in my medical decision making (see chart for details).  Patient resents to the emergency department for evaluation of acute on chronic back pain. There are no red flag  symptoms or findings on exam to suggest spinal cord emergency.   Differential diagnosis includes but is not exclusive to musculoskeletal back pain, renal colic, urinary tract infection, pyelonephritis, intra-abdominal causes of back pain, aortic aneurysm or dissection, cauda equina syndrome, sciatica, lumbar disc disease, thoracic disc disease, etc.  At first the patient states that he just needs to get some rest which is the reason for his ED presentation. He then states that he is interested in a referral for detox. He has been drinking overnight and some this morning. He is slightly sleepy but able to follow commands and give a full history. He had a friend drive him to the emergency department today. I believe this time the patient is able to be safely discharged. I  provided Motrin here in the emergency department as well as a prescription for home. I provided outpatient detox resources at discharge and encouraged him to follow up today if he is interested.   At this time, I do not feel there is any life-threatening condition present. I have reviewed and discussed all results (EKG, imaging, lab, urine as appropriate), exam findings with patient. I have reviewed nursing notes and appropriate previous records.  I feel the patient is safe to be discharged home without further emergent workup. Discussed usual and customary return precautions. Patient and family (if present) verbalize understanding and are comfortable with this plan.  Patient will follow-up with their primary care provider. If they do not have a primary care provider, information for follow-up has been provided to them. All questions have been answered.  ____________________________________________  FINAL CLINICAL IMPRESSION(S) / ED DIAGNOSES  Final diagnoses:  Chronic bilateral low back pain without sciatica  Alcohol abuse  Cocaine abuse     MEDICATIONS GIVEN DURING THIS VISIT:  Medications  ibuprofen (ADVIL,MOTRIN) tablet 800 mg (not administered)     NEW OUTPATIENT MEDICATIONS STARTED DURING THIS VISIT:  New Prescriptions   IBUPROFEN (ADVIL,MOTRIN) 800 MG TABLET    Take 1 tablet (800 mg total) by mouth every 8 (eight) hours as needed.     Note:  This document was prepared using Dragon voice recognition software and may include unintentional dictation errors.  Nanda Quinton, MD Emergency Medicine    Long, [facility], MD 07/08/17 [phone]

## 2017-07-08 NOTE — Discharge Instructions (Addendum)
Substance Abuse Treatment Programs ° °Intensive Outpatient Programs °[facility]     °[address]      °[place], [place]                   °[phone]      ° °[facility] °[address] °[place], [place] °[phone] ° °[facility]     °(Inpatient and outpatient)     °[address]           °[phone]   ° °[facility] °336-288-1484 (Suboxone and Methadone) ° °[address]      °[place], [place] [address]      °336-882-2125      ° °[address] °[place], [place] °852-3033 ° °[facility] (Outpatient/Inpatient, Chemical)    °(insurance only) [phone]      °       °[facility] (Groups & Residential) °[place], [place] °336-389-1413 ° °   °[facility]     °[address]     °[place], [place]      °[phone]      ° °[facility] (for caregivers and family) °[address] °[place], [place] °336-299-4655 ° ° ° ° ° °Residential Treatment Programs °[facility]      °[address], [place], [place] [address]  °([phone]      ° °[facility] °[address] [place], [place] [address] °[phone] ° °[facility]        °336-248-8914      ° °[facility] °1-800-659-3381 ° °[facility]             °[address]                                         °[place], [place]                                                °877-615-2722 or 336-784-9470                              ° °[facility] [place] °[address] °[place], [address] °1.877.941.8954 ° °[facility]    °[address] [place] [address]      °[place], [place]     °[phone]      ° °[facility] °[address] °[place], [place] °336-285-9073 ° °[facility]   °[address]     °[place], [place] [address]     °336-899-1550      °Admissions: 8am-3pm M-F ° °[facility] °[address] °[place],  [place] °336-227-7417 ° °[facility]: Residential Program (90 Days)   °[place], [place]      °336-725-8389 or 800-758-6077    ° °[facility] °[place], [place] °(Walk in Hours over the weekend or by referral) ° °[facility] °[address], [place], [place] [address] °([phone] ° °Crisis Mobile: [facility]:  1-877-626-1772 (for crisis response 24 hours a day) °[facility] Hotline:      1-800-256-2452 °Outpatient Psychiatry and Counseling ° °[facility]: Mobile Crisis   Management 24 hours:  1-877-626-1772 ° °[facility] sliding scale fee and walk in schedule: M-F 8am-12pm/1pm-3pm °[address]  °[place], [place] [address] °[phone] ° °[facility] °[address] °[place], [place] [address] °336-703-9650 ° °[facility] ([facility])- new patient walk-in appointments available Monday - Friday 8am -3pm.          °[address] °[place], [place] [address] °336-676-6840 or crisis line- 336-676-6905 ° °[facility] Intensive Outpatient Therapy Program °[address] °[place], [place] [address] °336-832-9804 ° °[facility]                  °Crisis Services      °336.641.4993      °[address]     °[place], [place] [address]                ° °[facility]   °[facility] °[phone] °[address] °[place], [place] [address] ° ° °[facility]          °[address]  °[place], [place] [address]       °([phone] ° °[facility] °[address] °[place], [place] [address] °336-292-1510 ° °[facility]    °[address]    °[place], [place] [address]     °336-632-3505      ° °Parish McKinney, MD     °[address]     °[place] [address]     °336-282-1251     °  °[facility] °[address] °[place] [address] ° °[facility]     °[address]     °[place], [place]      °336-542-2076      ° °[facility] °Shamsher Ahluwalia, MD °[address] °[place], [place] [address] °336-420-9558 ° °[facility]     °[address]     °[place], [place] [address]     °336-274-1237      ° °[facility] °[address] °[place], [place] [address] °336-854-4450 °Resources for Temporary Residential Assistance/Crisis Centers ° °DAY CENTERS °[facility] °M-F 8am-3pm   °[address] [place], [place] [address]   336-332-0824 °Services include: laundry, barbering, support groups, case management, phone  & computer access, showers, AA/NA mtgs, mental health/substance abuse nurse, job skills class, disability information, VA assistance, spiritual classes, etc.  ° °HOMELESS SHELTERS ° °[facility]     °[facility]   °[address], [place]     °336.271.5959       °       °[facility] (women and children)       °[address] °[place], [place] [address] °336-275-0820 °[email or web address] for application and process °Application Required ° °[facility]   °[address]    °[place] [address]     °[phone]       °             °[facility] °[address] °[place], [place] [address] °336.273.5572 °336-235-0363(schedule application appt.) °Application Required ° °[facility] (women only)    °[address]     °[place], [place] [address]     °336-884-1039      °  Intake starts 6pm daily °Need valid ID, SSC, & Police report °[facility] [place] °[address] °[place], [place] °336-881-5420 °Application Required ° °[facility] (men only)     °[address]      °[place], [place]     °[phone]      ° °[facility] °(Pregnant women only) °[address] °[place], [place] °336-275-0206 ° °[facility]      °[address]      °[place], [place] [address]     °336-722-9951      °       °[facility] °[address] °[place], [place] °336-723-1848 °90 day commitment/SA/Application process ° °[facility](men only)     °[address]     °[place], [place]     °[phone]       °Check-in at 7pm     °       °[facility] °[address] °[place], [place] [address] °336-248-6684 °Men/Women/Women and Children must be there by 7 pm ° °[facility] °[place], [place] °336-722-8721                ° °

## 2017-07-10 ENCOUNTER — Emergency Department (HOSPITAL_COMMUNITY)
Admission: EM | Admit: 2017-07-10 | Discharge: 2017-07-10 | Disposition: A | Discharge status: Home / Self Care | Payer: Medicare Other | Attending: Emergency Medicine | Admitting: Emergency Medicine

## 2017-07-10 ENCOUNTER — Encounter (HOSPITAL_COMMUNITY): Payer: Self-pay | Admitting: *Deleted

## 2017-07-10 ENCOUNTER — Emergency Department (HOSPITAL_COMMUNITY): Payer: Medicare Other

## 2017-07-10 DIAGNOSIS — F17210 Nicotine dependence, cigarettes, uncomplicated: Secondary | ICD-10-CM | POA: Insufficient documentation

## 2017-07-10 DIAGNOSIS — I10 Essential (primary) hypertension: Secondary | ICD-10-CM | POA: Insufficient documentation

## 2017-07-10 DIAGNOSIS — S60221A Contusion of right hand, initial encounter: Secondary | ICD-10-CM | POA: Diagnosis not present

## 2017-07-10 DIAGNOSIS — Z96642 Presence of left artificial hip joint: Secondary | ICD-10-CM | POA: Insufficient documentation

## 2017-07-10 DIAGNOSIS — S6991XA Unspecified injury of right wrist, hand and finger(s), initial encounter: Secondary | ICD-10-CM | POA: Diagnosis present

## 2017-07-10 DIAGNOSIS — S60222A Contusion of left hand, initial encounter: Secondary | ICD-10-CM

## 2017-07-10 DIAGNOSIS — Z79899 Other long term (current) drug therapy: Secondary | ICD-10-CM | POA: Diagnosis not present

## 2017-07-10 DIAGNOSIS — E119 Type 2 diabetes mellitus without complications: Secondary | ICD-10-CM | POA: Insufficient documentation

## 2017-07-10 DIAGNOSIS — W19XXXA Unspecified fall, initial encounter: Secondary | ICD-10-CM | POA: Insufficient documentation

## 2017-07-10 DIAGNOSIS — Y939 Activity, unspecified: Secondary | ICD-10-CM | POA: Insufficient documentation

## 2017-07-10 DIAGNOSIS — Y929 Unspecified place or not applicable: Secondary | ICD-10-CM | POA: Insufficient documentation

## 2017-07-10 DIAGNOSIS — Y999 Unspecified external cause status: Secondary | ICD-10-CM | POA: Insufficient documentation

## 2017-07-10 DIAGNOSIS — Z7982 Long term (current) use of aspirin: Secondary | ICD-10-CM | POA: Diagnosis not present

## 2017-07-10 DIAGNOSIS — Z7984 Long term (current) use of oral hypoglycemic drugs: Secondary | ICD-10-CM | POA: Diagnosis not present

## 2017-07-10 LAB — CBG MONITORING, ED: Glucose-Capillary: 164 mg/dL — ABNORMAL HIGH (ref 65–99)

## 2017-07-10 MED ORDER — CYCLOBENZAPRINE HCL 10 MG PO TABS
10.0000 mg | ORAL_TABLET | Freq: Once | ORAL | Status: AC
  Administered 2017-07-10: 10 mg via ORAL
  Filled 2017-07-10: qty 1

## 2017-07-10 MED ORDER — BACITRACIN ZINC 500 UNIT/GM EX OINT
1.0000 "application " | TOPICAL_OINTMENT | Freq: Two times a day (BID) | CUTANEOUS | Status: DC
  Administered 2017-07-10: 1 via TOPICAL
  Filled 2017-07-10: qty 0.9

## 2017-07-10 MED ORDER — IBUPROFEN 800 MG PO TABS
800.0000 mg | ORAL_TABLET | Freq: Once | ORAL | Status: AC
Start: 2017-07-10 — End: 2017-07-10
  Administered 2017-07-10: 800 mg via ORAL
  Filled 2017-07-10: qty 1

## 2017-07-10 NOTE — Discharge Instructions (Signed)
Apply ice packs on and off to your hands.  Continue taking your ibuprofen as directed.  Follow-up with your doctor for recheck

## 2017-07-10 NOTE — ED Triage Notes (Signed)
C/O BILATERAL HAND PAIN AFTER A FALL 2=4 DAYS AGO, SEEN 2 DAYS AGO FOR SAME, STATES HE FORGOT TO MENTION HIS HANDS

## 2017-07-10 NOTE — ED Provider Notes (Signed)
[facility] DEPT Provider Note   CSN: [id number] Arrival date & time: 07/10/17  1747     History   Chief Complaint Chief Complaint  Patient presents with  . Fall    HPI [name] [name] is a 56 y.o. male.  HPI   [name] [name] is a 56 y.o. male who presents to the Emergency Department complaining of Bilateral hand pain secondary to a fall. Patient was seen here 2 days ago after the fall and evaluated for low back pain. He states that he forgot to mention to the provider that he had pain to his hands as well. He describes a forward fall in which he landed on his hands. He describes pain to the palmar surface of both hands with right hand worse than left. Describes pain as a burning sensation. pain associated with movement of his fingers. He denies swelling, open wounds, wrist pain, numbness or tingling to his fingers. He has not tried any therapies at home or medications.  Past Medical History:  Diagnosis Date  . Acid reflux   . Alcohol abuse    6-8 cans of beer daily; hx of incarceration for DWI  . Arthritis   . Bipolar disorder ([place])   . Bronchitis   . Chronic back pain   . Chronic neck pain   . Chronic pain   . Diabetes mellitus without complication ([place])   . Fracture of lower leg   . Gout   . Hyperlipidemia   . Hypertension   . Left arm pain    chronic  . Pancreatitis    March 2013  . Pseudocyst of pancreas 02/28/2012    Patient Active Problem List   Diagnosis Date Noted  . History of diabetes mellitus 10/10/2016  . Avascular necrosis of hip, left ([place]) 10/04/2016  . Status post left hip replacement 10/04/2016  . Rash and nonspecific skin eruption 08/12/2013  . Cholelithiases 08/04/2013  . Subcutaneous nodule 07/21/2013  . Acute alcoholic pancreatitis 98/33/8250  . Syncope 04/11/2013  . Prolonged Q-T interval on ECG 04/11/2013  . Cocaine abuse 03/13/2013    Class: Chronic  . At risk for adverse drug event 02/14/2013  . DDD (degenerative disc disease), lumbar  02/01/2013  . Dyspepsia 12/01/2012  . BPH (benign prostatic hyperplasia) 10/07/2012  . Allergic rhinitis 10/03/2012  . Transaminitis 10/02/2012  . Chronic pain syndrome 10/02/2012  . Essential hypertension, benign 07/01/2012  . Tobacco user 07/01/2012  . Hepatic steatosis 03/06/2012  . ED (erectile dysfunction) 03/06/2012  . Pseudocyst of pancreas 02/28/2012  . Alcohol abuse 02/25/2012  . GERD (gastroesophageal reflux disease) 02/23/2012  . Bipolar 1 disorder ([place]) 02/23/2012  . Hypertriglyceridemia 12/05/2011    Past Surgical History:  Procedure Laterality Date  . CIRCUMCISION  03/17/2012   Procedure: CIRCUMCISION ADULT;  Surgeon: Marissa Nestle, MD;  Location: [facility];  Service: Urology;  Laterality: N/A;  . COLONOSCOPY WITH PROPOFOL  12/24/2012   NLZ:JQBH lesion as described above status post biopsy; otherwise normal rectum/Sigmoid diverticulosis/Sigmoid polyps -- resected as described above. anal lesion, benign. colon polyp, hyperplastic  . ESOPHAGOGASTRODUODENOSCOPY (EGD) WITH PROPOFOL  12/24/2012   ALP:FXTKWIO erythema and erosions of uncertain significance status post gastric biopsy (reactive gastropathy NO h.pylori)  . NOSE SURGERY     broken nose  . TOTAL HIP ARTHROPLASTY Left 10/04/2016   Procedure: LEFT TOTAL HIP ARTHROPLASTY ANTERIOR APPROACH;  Surgeon: Mcarthur Rossetti, MD;  Location: [facility];  Service: Orthopedics;  Laterality: Left;       Home Medications  Prior to Admission medications   Medication Sig Start Date End Date Taking? Authorizing Provider  amLODipine (NORVASC) 5 MG tablet Take 1 tablet (5 mg total) by mouth daily. For high blood pressure control 07/21/13   Alycia Rossetti, MD  aspirin EC 325 MG EC tablet Take 1 tablet (325 mg total) by mouth 2 (two) times daily after a meal. 10/06/16   Mcarthur Rossetti, MD  cyclobenzaprine (FLEXERIL) 10 MG tablet Take 1 tablet (10 mg total) by mouth 3 (three) times daily. 12/02/16   Lily Kocher, PA-C    fluticasone (FLONASE) 50 MCG/ACT nasal spray Place 1 spray into both nostrils 2 (two) times daily.    [provider]  gabapentin (NEURONTIN) 300 MG capsule TAKE 1 CAPSULE BY MOUTH THREE TIMES A DAY. 05/12/17   Carole Civil, MD  gemfibrozil (LOPID) 600 MG tablet Take 1 tablet (600 mg total) by mouth 2 (two) times daily before a meal. Cholesterol control Patient not taking: Reported on 02/19/2017 07/21/13   Alycia Rossetti, MD  ibuprofen (ADVIL,MOTRIN) 800 MG tablet Take 1 tablet (800 mg total) by mouth every 8 (eight) hours as needed. 07/08/17   Long, Wonda Olds, MD  lisinopril (PRINIVIL,ZESTRIL) 5 MG tablet Take 5 mg by mouth daily.    [provider]  lovastatin (MEVACOR) 40 MG tablet Take 1 tablet by mouth daily. 01/28/17   [provider]  metFORMIN (GLUCOPHAGE) 1000 MG tablet Take 1,000 mg by mouth daily with breakfast.    [provider]  methocarbamol (ROBAXIN) 500 MG tablet Take 1 tablet (500 mg total) by mouth 2 (two) times daily. Patient not taking: Reported on 02/19/2017 09/02/16   Fransico Meadow, PA-C  Multiple Vitamin (MULTIVITAMIN WITH MINERALS) TABS Take 1 tablet by mouth daily.    [provider]  naproxen (NAPROSYN) 500 MG tablet Take 1 tablet (500 mg total) by mouth 2 (two) times daily. 09/29/16   Tanna Furry, MD  omeprazole (PRILOSEC) 20 MG capsule Take 1 capsule (20 mg total) by mouth daily. For acid reflux 07/21/13   Alycia Rossetti, MD  tamsulosin (FLOMAX) 0.4 MG CAPS Take 1 capsule (0.4 mg total) by mouth daily. For enlarged prostate 07/21/13   Alycia Rossetti, MD    Family History Family History  Problem Relation Age of Onset  . Diabetes Father   . Anesthesia problems Neg Hx   . Hypotension Neg Hx   . Malignant hyperthermia Neg Hx   . Pseudochol deficiency Neg Hx   . Colon cancer Neg Hx   . Heart disease Neg Hx   . Stroke Neg Hx   . Cancer Neg Hx     Social History Social History  Substance Use Topics  . Smoking  status: Current Every Day [name]    Packs/day: 0.50    Years: 25.00    Types: Cigarettes  . Smokeless tobacco: Former Systems developer    Types: Chew  . Alcohol use 0.0 oz/week     Comment: "about a 6 pack a day"     Allergies   Penicillins   Review of Systems Review of Systems  Constitutional: Negative for chills and fever.  Musculoskeletal: Positive for arthralgias (Bilateral hand pain). Negative for joint swelling and neck pain.  Skin: Negative for color change and wound.  All other systems reviewed and are negative.    Physical Exam Updated Vital Signs BP 111/63   Pulse 84   Temp 98.1 F (36.7 C) (Oral)   Resp 18  Ht 5\' 10"  (1.778 m)   Wt 88.5 kg (195 lb)   SpO2 97%   BMI 27.98 kg/m   Physical Exam  Constitutional: He is oriented to person, place, and time. He appears well-developed and well-nourished. No distress.  HENT:  Head: Atraumatic.  Neck: Normal range of motion.  Cardiovascular: Normal rate, regular rhythm and intact distal pulses.   Pulmonary/Chest: Effort normal and breath sounds normal. No respiratory distress.  Musculoskeletal: Normal range of motion. He exhibits tenderness. He exhibits no edema or deformity.  Tenderness to bilateral hands along the palmar surface of the proximal hand, right > left. No bony tenderness. Bilateral wrist are nontender. No edema. Patient has full range of motion of the fingers of both hands.  Neurological: He is alert and oriented to person, place, and time. No sensory deficit.  Skin: Skin is warm. Capillary refill takes less than 2 seconds. No erythema.  Nursing note and vitals reviewed.    ED Treatments / Results  Labs (all labs ordered are listed, but only abnormal results are displayed) Labs Reviewed  CBG MONITORING, ED - Abnormal; Notable for the following:       Result Value   Glucose-Capillary 164 (*)    All other components within normal limits    EKG  EKG Interpretation None       Radiology No results  found.  Procedures Procedures (including critical care time)  Medications Ordered in ED Medications  bacitracin ointment 1 application (not administered)  ibuprofen (ADVIL,MOTRIN) tablet 800 mg (not administered)  cyclobenzaprine (FLEXERIL) tablet 10 mg (not administered)     Initial Impression / Assessment and Plan / ED Course  I have reviewed the triage vital signs and the nursing notes.  Pertinent labs & imaging results that were available during my care of the patient were reviewed by me and considered in my medical decision making (see chart for details).     Mild tenderness to the proximal hands without bony deformities. No edema. Patient has full range of motion. Neurovascularly intact. Right hand was dressed with bacitracin and bulky dressing applied.  Final Clinical Impressions(s) / ED Diagnoses   Final diagnoses:  Contusion of right hand, initial encounter  Contusion of left hand, initial encounter    New Prescriptions New Prescriptions   No medications on file     Kem Parkinson, Hershal Coria 07/10/17 1948    Daleen Bo, MD 07/12/17 2326

## 2017-07-22 ENCOUNTER — Ambulatory Visit (HOSPITAL_COMMUNITY)
Admission: RE | Admit: 2017-07-22 | Discharge: 2017-07-22 | Disposition: A | Discharge status: Home / Self Care | Payer: Medicare Other | Source: Ambulatory Visit | Attending: Family Medicine | Admitting: Family Medicine

## 2017-07-22 ENCOUNTER — Other Ambulatory Visit (HOSPITAL_COMMUNITY): Payer: Self-pay | Admitting: Family Medicine

## 2017-07-22 DIAGNOSIS — M545 Low back pain: Secondary | ICD-10-CM | POA: Diagnosis not present

## 2017-07-22 DIAGNOSIS — M5136 Other intervertebral disc degeneration, lumbar region: Secondary | ICD-10-CM | POA: Diagnosis not present

## 2017-11-12 ENCOUNTER — Ambulatory Visit (INDEPENDENT_AMBULATORY_CARE_PROVIDER_SITE_OTHER): Payer: Medicare Other | Admitting: Physician Assistant

## 2018-01-06 ENCOUNTER — Encounter (HOSPITAL_COMMUNITY): Payer: Self-pay | Admitting: Emergency Medicine

## 2018-01-06 ENCOUNTER — Emergency Department (HOSPITAL_COMMUNITY)
Admission: EM | Admit: 2018-01-06 | Discharge: 2018-01-06 | Disposition: A | Discharge status: Home / Self Care | Payer: Medicare HMO | Attending: Emergency Medicine | Admitting: Emergency Medicine

## 2018-01-06 ENCOUNTER — Other Ambulatory Visit: Payer: Self-pay

## 2018-01-06 ENCOUNTER — Emergency Department (HOSPITAL_COMMUNITY): Payer: Medicare HMO

## 2018-01-06 DIAGNOSIS — Z79899 Other long term (current) drug therapy: Secondary | ICD-10-CM | POA: Insufficient documentation

## 2018-01-06 DIAGNOSIS — F17210 Nicotine dependence, cigarettes, uncomplicated: Secondary | ICD-10-CM | POA: Diagnosis not present

## 2018-01-06 DIAGNOSIS — R51 Headache: Secondary | ICD-10-CM | POA: Insufficient documentation

## 2018-01-06 DIAGNOSIS — Z7984 Long term (current) use of oral hypoglycemic drugs: Secondary | ICD-10-CM | POA: Insufficient documentation

## 2018-01-06 DIAGNOSIS — I10 Essential (primary) hypertension: Secondary | ICD-10-CM | POA: Diagnosis not present

## 2018-01-06 DIAGNOSIS — E119 Type 2 diabetes mellitus without complications: Secondary | ICD-10-CM | POA: Diagnosis not present

## 2018-01-06 DIAGNOSIS — Z7982 Long term (current) use of aspirin: Secondary | ICD-10-CM | POA: Diagnosis not present

## 2018-01-06 DIAGNOSIS — R519 Headache, unspecified: Secondary | ICD-10-CM

## 2018-01-06 LAB — BASIC METABOLIC PANEL
Anion gap: 12 (ref 5–15)
BUN: 12 mg/dL (ref 6–20)
CO2: 27 mmol/L (ref 22–32)
Calcium: 9.4 mg/dL (ref 8.9–10.3)
Chloride: 102 mmol/L (ref 101–111)
Creatinine, Ser: 1.02 mg/dL (ref 0.61–1.24)
GFR calc Af Amer: >60 mL/min (ref >60)
GFR calc non Af Amer: >60 mL/min (ref >60)
Glucose, Bld: 86 mg/dL (ref 65–99)
Potassium: 4 mmol/L (ref 3.5–5.1)
Sodium: 141 mmol/L (ref 135–145)

## 2018-01-06 LAB — CBG MONITORING, ED: Glucose-Capillary: 100 mg/dL — ABNORMAL HIGH (ref 65–99)

## 2018-01-06 MED ORDER — MORPHINE SULFATE (PF) 4 MG/ML IV SOLN
4.0000 mg | Freq: Once | INTRAVENOUS | Status: AC
  Administered 2018-01-06: 4 mg via INTRAVENOUS
  Filled 2018-01-06: qty 1

## 2018-01-06 MED ORDER — DIPHENHYDRAMINE HCL 50 MG/ML IJ SOLN
25.0000 mg | Freq: Once | INTRAMUSCULAR | Status: AC
  Administered 2018-01-06: 25 mg via INTRAVENOUS
  Filled 2018-01-06: qty 1

## 2018-01-06 MED ORDER — PROCHLORPERAZINE EDISYLATE 5 MG/ML IJ SOLN
10.0000 mg | Freq: Once | INTRAMUSCULAR | Status: AC
  Administered 2018-01-06: 10 mg via INTRAVENOUS
  Filled 2018-01-06: qty 2

## 2018-01-06 MED ORDER — PROMETHAZINE HCL 25 MG RE SUPP: 25.0000 mg | Freq: Four times a day (QID) | RECTAL | 0 refills | Status: DC | PRN

## 2018-01-06 NOTE — ED Notes (Signed)
Pt called out for pain meds for his migraine. EDPA made aware and will be seen shortly.

## 2018-01-06 NOTE — Discharge Instructions (Signed)
Your blood work is within normal limits.  Your blood pressure is only mildly elevated.  A CT scan of your head is negative for any bleeding, mass, or shifting of structures within the brain.  Please see Dr. Maudie Mercury as soon as possible for evaluation of your recurrent headaches.  Use promethazine suppositories 1 by rectum every 6 hours if needed for vomiting.  This medication may cause drowsiness, please use it with caution.

## 2018-01-06 NOTE — ED Triage Notes (Signed)
Pt c/o of migraine for 2 days.  Started vomiting today.  No fever.  VSS.

## 2018-01-06 NOTE — ED Provider Notes (Signed)
[facility] EMERGENCY DEPARTMENT Provider Note   CSN: [id number] Arrival date & time: 01/06/18  1644     History   Chief Complaint Chief Complaint  Patient presents with  . Migraine    HPI [name] [name]. is a 57 y.o. male.  Patient is a 57 year old male who presents to the emergency department with a complaint of headache.  The patient states that he has had a problem with a quotation marks migraine" for the last 2-3 days.  He is tried conservative measures but these have not been effective.  He states he became concerned because today he started having some vomiting and some blurring of his vision.  He states that this headache seem to be worse than others.  He is not had any injury or trauma to his head.  No recent changes in his medications or his diet.  When asked about frequency of headache, the patient states that he gets headaches 3-4 days out of 7, but they are not this bad.  Patient presents now for assistance with this headache.      Past Medical History:  Diagnosis Date  . Acid reflux   . Alcohol abuse    6-8 cans of beer daily; hx of incarceration for DWI  . Arthritis   . Bipolar disorder ([place])   . Bronchitis   . Chronic back pain   . Chronic neck pain   . Chronic pain   . Diabetes mellitus without complication ([place])   . Fracture of lower leg   . Gout   . Hyperlipidemia   . Hypertension   . Left arm pain    chronic  . Pancreatitis    March 2013  . Pseudocyst of pancreas 02/28/2012    Patient Active Problem List   Diagnosis Date Noted  . History of diabetes mellitus 10/10/2016  . Avascular necrosis of hip, left ([place]) 10/04/2016  . Status post left hip replacement 10/04/2016  . Rash and nonspecific skin eruption 08/12/2013  . Cholelithiases 08/04/2013  . Subcutaneous nodule 07/21/2013  . Acute alcoholic pancreatitis 09/14/3006  . Syncope 04/11/2013  . Prolonged Q-T interval on ECG 04/11/2013  . Cocaine abuse ([place]) 03/13/2013    Class: Chronic  .  At risk for adverse drug event 02/14/2013  . DDD (degenerative disc disease), lumbar 02/01/2013  . Dyspepsia 12/01/2012  . BPH (benign prostatic hyperplasia) 10/07/2012  . Allergic rhinitis 10/03/2012  . Transaminitis 10/02/2012  . Chronic pain syndrome 10/02/2012  . Essential hypertension, benign 07/01/2012  . Tobacco user 07/01/2012  . Hepatic steatosis 03/06/2012  . ED (erectile dysfunction) 03/06/2012  . Pseudocyst of pancreas 02/28/2012  . Alcohol abuse 02/25/2012  . GERD (gastroesophageal reflux disease) 02/23/2012  . Bipolar 1 disorder ([place]) 02/23/2012  . Hypertriglyceridemia 12/05/2011    Past Surgical History:  Procedure Laterality Date  . CIRCUMCISION  03/17/2012   Procedure: CIRCUMCISION ADULT;  Surgeon: Marissa Nestle, MD;  Location: [facility];  Service: Urology;  Laterality: N/A;  . COLONOSCOPY WITH PROPOFOL  12/24/2012   MAU:QJFH lesion as described above status post biopsy; otherwise normal rectum/Sigmoid diverticulosis/Sigmoid polyps -- resected as described above. anal lesion, benign. colon polyp, hyperplastic  . ESOPHAGOGASTRODUODENOSCOPY (EGD) WITH PROPOFOL  12/24/2012   [id number]:[id number] erythema and erosions of uncertain significance status post gastric biopsy (reactive gastropathy NO h.pylori)  . NOSE SURGERY     broken nose  . TOTAL HIP ARTHROPLASTY Left 10/04/2016   Procedure: LEFT TOTAL HIP ARTHROPLASTY ANTERIOR APPROACH;  Surgeon: Mcarthur Rossetti, MD;  Location: [facility];  Service: Orthopedics;  Laterality: Left;       Home Medications    Prior to Admission medications   Medication Sig Start Date End Date Taking? Authorizing Provider  amLODipine (NORVASC) 5 MG tablet Take 1 tablet (5 mg total) by mouth daily. For high blood pressure control 07/21/13   Alycia Rossetti, MD  aspirin EC 325 MG EC tablet Take 1 tablet (325 mg total) by mouth 2 (two) times daily after a meal. 10/06/16   Mcarthur Rossetti, MD  cyclobenzaprine (FLEXERIL) 10 MG tablet Take 1  tablet (10 mg total) by mouth 3 (three) times daily. 12/02/16   Lily Kocher, PA-C  fluticasone (FLONASE) 50 MCG/ACT nasal spray Place 1 spray into both nostrils 2 (two) times daily.    [provider]  gabapentin (NEURONTIN) 300 MG capsule TAKE 1 CAPSULE BY MOUTH THREE TIMES A DAY. 05/12/17   Carole Civil, MD  gemfibrozil (LOPID) 600 MG tablet Take 1 tablet (600 mg total) by mouth 2 (two) times daily before a meal. Cholesterol control Patient not taking: Reported on 02/19/2017 07/21/13   Alycia Rossetti, MD  ibuprofen (ADVIL,MOTRIN) 800 MG tablet Take 1 tablet (800 mg total) by mouth every 8 (eight) hours as needed. 07/08/17   Long, Wonda Olds, MD  lisinopril (PRINIVIL,ZESTRIL) 5 MG tablet Take 5 mg by mouth daily.    [provider]  lovastatin (MEVACOR) 40 MG tablet Take 1 tablet by mouth daily. 01/28/17   [provider]  metFORMIN (GLUCOPHAGE) 1000 MG tablet Take 1,000 mg by mouth daily with breakfast.    [provider]  methocarbamol (ROBAXIN) 500 MG tablet Take 1 tablet (500 mg total) by mouth 2 (two) times daily. Patient not taking: Reported on 02/19/2017 09/02/16   Fransico Meadow, PA-C  Multiple Vitamin (MULTIVITAMIN WITH MINERALS) TABS Take 1 tablet by mouth daily.    [provider]  naproxen (NAPROSYN) 500 MG tablet Take 1 tablet (500 mg total) by mouth 2 (two) times daily. 09/29/16   Tanna Furry, MD  omeprazole (PRILOSEC) 20 MG capsule Take 1 capsule (20 mg total) by mouth daily. For acid reflux 07/21/13   Alycia Rossetti, MD  tamsulosin (FLOMAX) 0.4 MG CAPS Take 1 capsule (0.4 mg total) by mouth daily. For enlarged prostate 07/21/13   Alycia Rossetti, MD    Family History Family History  Problem Relation Age of Onset  . Diabetes Father   . Anesthesia problems Neg Hx   . Hypotension Neg Hx   . Malignant hyperthermia Neg Hx   . Pseudochol deficiency Neg Hx   . Colon cancer Neg Hx   . Heart disease Neg Hx   . Stroke Neg Hx   .  Cancer Neg Hx     Social History Social History   Tobacco Use  . Smoking status: Current Every Day Smoker    Packs/day: 0.50    Years: 25.00    Pack years: 12.50    Types: Cigarettes  . Smokeless tobacco: Former Systems developer    Types: Chew  Substance Use Topics  . Alcohol use: Yes    Alcohol/week: 0.0 oz    Comment: "about a 6 pack a day"  . Drug use: Yes    Types: Cocaine, Marijuana    Comment: states last use was last night     Allergies   Penicillins   Review of Systems Review of Systems  Constitutional: Negative for activity change and appetite change.  All ROS Neg except as noted in HPI  HENT: Negative for congestion, ear discharge, ear pain, facial swelling, nosebleeds, rhinorrhea, sneezing and tinnitus.   Eyes: Positive for photophobia. Negative for pain and discharge.  Respiratory: Negative for cough, choking, shortness of breath and wheezing.   Cardiovascular: Negative for chest pain, palpitations and leg swelling.  Gastrointestinal: Negative for abdominal pain, blood in stool, constipation, diarrhea, nausea and vomiting.  Genitourinary: Negative for difficulty urinating, dysuria, flank pain, frequency and hematuria.  Musculoskeletal: Negative for arthralgias, back pain, gait problem, myalgias and neck pain.  Skin: Negative.  Negative for color change, rash and wound.  Neurological: Positive for headaches. Negative for dizziness, seizures, syncope, facial asymmetry, speech difficulty, weakness and numbness.  Hematological: Negative for adenopathy. Does not bruise/bleed easily.  Psychiatric/Behavioral: Negative for agitation, confusion, hallucinations, self-injury and suicidal ideas. The patient is not nervous/anxious.      Physical Exam Updated Vital Signs BP (!) 134/99 (BP Location: Right Arm)   Pulse (!) 59   Temp 98.5 F (36.9 C) (Oral)   Resp 18   Ht 5\' 10"  (1.778 m)   Wt 89.4 kg (197 lb)   SpO2 100%   BMI 28.27 kg/m   Physical Exam    Constitutional: He appears well-developed and well-nourished. No distress.  HENT:  Head: Normocephalic and atraumatic.  Right Ear: External ear normal.  Left Ear: External ear normal.  Eyes: Conjunctivae are normal. Right eye exhibits no discharge. Left eye exhibits no discharge. No scleral icterus.  Neck: Neck supple. No tracheal deviation present.  Cardiovascular: Normal rate, regular rhythm and intact distal pulses.  Pulmonary/Chest: Effort normal and breath sounds normal. No stridor. No respiratory distress. He has no wheezes. He has no rales.  Abdominal: Soft. Bowel sounds are normal. He exhibits no distension. There is no tenderness. There is no rebound and no guarding.  Musculoskeletal: He exhibits no edema or tenderness.  Neurological: He is alert. He has normal strength. No cranial nerve deficit (no facial droop, extraocular movements intact, no slurred speech) or sensory deficit. He exhibits normal muscle tone. He displays no seizure activity. Coordination normal.  Skin: Skin is warm and dry. No rash noted.  Psychiatric: He has a normal mood and affect.  Nursing note and vitals reviewed.    ED Treatments / Results  Labs (all labs ordered are listed, but only abnormal results are displayed) Labs Reviewed  CBG MONITORING, ED - Abnormal; Notable for the following components:      Result Value   Glucose-Capillary 100 (*)    All other components within normal limits    EKG  EKG Interpretation None       Radiology No results found.  Procedures Procedures (including critical care time)  Medications Ordered in ED Medications - No data to display   Initial Impression / Assessment and Plan / ED Course  I have reviewed the triage vital signs and the nursing notes.  Pertinent labs & imaging results that were available during my care of the patient were reviewed by me and considered in my medical decision making (see chart for details).      Final Clinical  Impressions(s) / ED Diagnoses MDM Patient complains of blurring of vision, headache that seems to be increasing in severity, and now vomiting.  No gross neurologic deficit appreciated on examination.  Gait is steady and intact.  Basic metabolic panel is well within normal limits.  CT head scan is negative for acute infarction, hemorrhage, hydrocephalus, mass, or any shifting  of major structures.  Recheck after pain medication the pain is much improved.  Gait remains intact no gross neurologic deficit appreciated.  Patient is drinking liquids in the emergency department without any problem whatsoever.  I have asked the patient to follow-up with Dr. Maudie Mercury concerning his increasing headaches.  Patient will return to the emergency department if any emergent changes, problems, or concerns.   Final diagnoses:  Bad headache    ED Discharge Orders        Ordered    promethazine (PHENERGAN) 25 MG suppository  Every 6 hours PRN     01/06/18 2143       Lily Kocher, PA-C 01/06/18 2147    Davonna Belling, MD 01/06/18 2358

## 2018-01-14 ENCOUNTER — Ambulatory Visit (INDEPENDENT_AMBULATORY_CARE_PROVIDER_SITE_OTHER): Payer: Medicare HMO | Admitting: Orthopaedic Surgery

## 2018-01-14 ENCOUNTER — Encounter: Payer: Self-pay | Admitting: Orthopaedic Surgery

## 2018-01-14 ENCOUNTER — Ambulatory Visit (INDEPENDENT_AMBULATORY_CARE_PROVIDER_SITE_OTHER): Payer: Medicare HMO

## 2018-01-14 VITALS — BP 132/85 | HR 86 | Ht 70.0 in | Wt 201.0 lb

## 2018-01-14 DIAGNOSIS — M79642 Pain in left hand: Secondary | ICD-10-CM

## 2018-01-14 DIAGNOSIS — M79641 Pain in right hand: Secondary | ICD-10-CM | POA: Diagnosis not present

## 2018-01-14 MED ORDER — PREDNISONE 5 MG (21) PO TBPK: ORAL_TABLET | ORAL | 0 refills | Status: DC

## 2018-01-14 NOTE — Patient Instructions (Signed)

## 2018-01-14 NOTE — Progress Notes (Signed)
Patient GY:FVCBSW [name] Burgo., male DOB:1961/03/01, 57 y.o. HQP:[id number]  Chief Complaint  Patient presents with  . New Patient (Initial Visit)    bilateral hand pain    HPI  [name] Schembri. is a 57 y.o. male who has pain of both hands more at the base of the thumb metacarpal.  He has no trauma.  He has nocturnal numbness as well.  He has pain when gripping something firmly.  He has not done anything for it.  He has no redness. HPI  Body mass index is 28.84 kg/m.  ROS  Review of Systems  Musculoskeletal: Positive for arthralgias and joint swelling.  All other systems reviewed and are negative.   Past Medical History:  Diagnosis Date  . Acid reflux   . Alcohol abuse    6-8 cans of beer daily; hx of incarceration for DWI  . Arthritis   . Bipolar disorder ([place])   . Bronchitis   . Chronic back pain   . Chronic neck pain   . Chronic pain   . Diabetes mellitus without complication ([place])   . Fracture of lower leg   . Gout   . Hyperlipidemia   . Hypertension   . Left arm pain    chronic  . Pancreatitis    March 2013  . Pseudocyst of pancreas 02/28/2012    Past Surgical History:  Procedure Laterality Date  . CIRCUMCISION  03/17/2012   Procedure: CIRCUMCISION ADULT;  Surgeon: Marissa Nestle, MD;  Location: [facility];  Service: Urology;  Laterality: N/A;  . COLONOSCOPY WITH PROPOFOL  12/24/2012   ZLD:JTTS lesion as described above status post biopsy; otherwise normal rectum/Sigmoid diverticulosis/Sigmoid polyps -- resected as described above. anal lesion, benign. colon polyp, hyperplastic  . ESOPHAGOGASTRODUODENOSCOPY (EGD) WITH PROPOFOL  12/24/2012   [id number] erythema and erosions of uncertain significance status post gastric biopsy (reactive gastropathy NO h.pylori)  . NOSE SURGERY     broken nose  . TOTAL HIP ARTHROPLASTY Left 10/04/2016   Procedure: LEFT TOTAL HIP ARTHROPLASTY ANTERIOR APPROACH;  Surgeon: Mcarthur Rossetti, MD;  Location: [facility];  Service: Orthopedics;   Laterality: Left;    Family History  Problem Relation Age of Onset  . Diabetes Father   . Anesthesia problems Neg Hx   . Hypotension Neg Hx   . Malignant hyperthermia Neg Hx   . Pseudochol deficiency Neg Hx   . Colon cancer Neg Hx   . Heart disease Neg Hx   . Stroke Neg Hx   . Cancer Neg Hx     Social History Social History   Tobacco Use  . Smoking status: Current Every Day Smoker    Packs/day: 0.50    Years: 25.00    Pack years: 12.50    Types: Cigarettes  . Smokeless tobacco: Former Systems developer    Types: Chew  Substance Use Topics  . Alcohol use: Yes    Alcohol/week: 0.0 oz    Comment: "about a 6 pack a day"  . Drug use: Yes    Types: Cocaine, Marijuana    Comment: states last use was last night    Allergies  Allergen Reactions  . Penicillins Itching    Has patient had a PCN reaction causing immediate rash, facial/tongue/throat swelling, SOB or lightheadedness with hypotension: No Has patient had a PCN reaction causing severe rash involving mucus membranes or skin necrosis: No Has patient had a PCN reaction that required hospitalization No Has patient had a PCN reaction occurring within the last 10  years: No If all of the above answers are "NO", then may proceed with Cephalosporin use.     Current Outpatient Medications  Medication Sig Dispense Refill  . amLODipine (NORVASC) 5 MG tablet Take 1 tablet (5 mg total) by mouth daily. For high blood pressure control 30 tablet 3  . aspirin EC 325 MG EC tablet Take 1 tablet (325 mg total) by mouth 2 (two) times daily after a meal. 30 tablet 0  . cyclobenzaprine (FLEXERIL) 10 MG tablet Take 1 tablet (10 mg total) by mouth 3 (three) times daily. 20 tablet 0  . fluticasone (FLONASE) 50 MCG/ACT nasal spray Place 1 spray into both nostrils 2 (two) times daily.    Marland Kitchen gabapentin (NEURONTIN) 300 MG capsule TAKE 1 CAPSULE BY MOUTH THREE TIMES A DAY. 90 capsule 0  . gemfibrozil (LOPID) 600 MG tablet Take 1 tablet (600 mg total) by  mouth 2 (two) times daily before a meal. Cholesterol control (Patient not taking: Reported on 02/19/2017) 60 tablet 3  . ibuprofen (ADVIL,MOTRIN) 800 MG tablet Take 1 tablet (800 mg total) by mouth every 8 (eight) hours as needed. 21 tablet 0  . lisinopril (PRINIVIL,ZESTRIL) 5 MG tablet Take 5 mg by mouth daily.    Marland Kitchen lovastatin (MEVACOR) 40 MG tablet Take 1 tablet by mouth daily.    . metFORMIN (GLUCOPHAGE) 1000 MG tablet Take 1,000 mg by mouth daily with breakfast.    . methocarbamol (ROBAXIN) 500 MG tablet Take 1 tablet (500 mg total) by mouth 2 (two) times daily. (Patient not taking: Reported on 02/19/2017) 20 tablet 0  . Multiple Vitamin (MULTIVITAMIN WITH MINERALS) TABS Take 1 tablet by mouth daily.    . naproxen (NAPROSYN) 500 MG tablet Take 1 tablet (500 mg total) by mouth 2 (two) times daily. 30 tablet 0  . omeprazole (PRILOSEC) 20 MG capsule Take 1 capsule (20 mg total) by mouth daily. For acid reflux 30 capsule 3  . promethazine (PHENERGAN) 25 MG suppository Place 1 suppository (25 mg total) rectally every 6 (six) hours as needed for nausea or vomiting. 10 each 0  . tamsulosin (FLOMAX) 0.4 MG CAPS Take 1 capsule (0.4 mg total) by mouth daily. For enlarged prostate 30 capsule 6   No current facility-administered medications for this visit.      Physical Exam  Blood pressure 132/85, pulse 86, height 5\' 10"  (1.778 m), weight 201 lb (91.2 kg).  Constitutional: overall normal hygiene, normal nutrition, well developed, normal grooming, normal body habitus. Assistive device:none  Musculoskeletal: gait and station Limp none, muscle tone and strength are normal, no tremors or atrophy is present.  .  Neurological: coordination overall normal.  Deep tendon reflex/nerve stretch intact.  Sensation normal.  Cranial nerves II-XII intact.   Skin:   Normal overall no scars, lesions, ulcers or rashes. No psoriasis.  Psychiatric: Alert and oriented x 3.  Recent memory intact, remote memory unclear.   Normal mood and affect. Well groomed.  Good eye contact.  Cardiovascular: overall no swelling, no varicosities, no edema bilaterally, normal temperatures of the legs and arms, no clubbing, cyanosis and good capillary refill.  Lymphatic: palpation is normal.  Both hands have pain at the base of the thumb/wrist area with slight swelling.  NV intact.  He has slight decreased sensation of the fingers in the median nerve distribution but negative Tinel and Phalen.  All other systems reviewed and are negative   The patient has been educated about the nature of the problem(s) and counseled on  treatment options.  The patient appeared to understand what I have discussed and is in agreement with it.  Encounter Diagnosis  Name Primary?  . Pain in both hands Yes    PLAN Call if any problems.  Precautions discussed. Begin Prednisone dose pack.  He cannot take NSAIDs secondary to ulceration in past.  I would like to obtain EMGs as well to rule out carpal tunnel.  I have provided a thumb splint to use on the hand or thumb that hurts the most.  He can move as needed. Return to clinic after EMGs   Electronically Signed Sanjuana Kava, MD 1/23/20192:31 PM

## 2018-01-28 ENCOUNTER — Ambulatory Visit: Payer: Self-pay | Admitting: Orthopaedic Surgery

## 2018-01-28 ENCOUNTER — Telehealth: Payer: Self-pay | Admitting: Orthopaedic Surgery

## 2018-01-28 NOTE — Telephone Encounter (Signed)
Mr. Ahles was scheduled to see you this afternoon but we have canceled the appointment.  I called this patient yesterday to see if he went for the EMGs.  He called back late this morning stating that he did not go.  I told him that they had tried to call him several times.  He said his phone had been off for a while.  I told him that they even sent a letter to him.  He said he didn't get it.  I have given Mr. Cuevas the number to [facility] in [place] and asked him to call them to set up EMGs.. I told him to call them and then call us back to reschedule this appointment.  I told him that this needed to be done pretty quick since you are going to be out of town soon.

## 2018-02-16 NOTE — Congregational Nurse Program (Signed)
Congregational Nurse Program Note  Date of Encounter: 02/16/2018  Past Medical History: Past Medical History:  Diagnosis Date  . Acid reflux   . Alcohol abuse    6-8 cans of beer daily; hx of incarceration for DWI  . Arthritis   . Bipolar disorder ([place])   . Bronchitis   . Chronic back pain   . Chronic neck pain   . Chronic pain   . Diabetes mellitus without complication ([place])   . Fracture of lower leg   . Gout   . Hyperlipidemia   . Hypertension   . Left arm pain    chronic  . Pancreatitis    March 2013  . 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 Stated he hadn't taken his medication. Discussed importance of taking medication everyday, following low salt diet and getting proper exercise. Referred to PCP. Stated he will call today for an appointment. Erma Heritage RN, [place] program, [phone]

## 2018-03-21 LAB — POCT GLYCOSYLATED HEMOGLOBIN (HGB A1C): Hemoglobin A1C: 6

## 2018-03-21 LAB — GLUCOSE, POCT (MANUAL RESULT ENTRY): POC Glucose: 92 mg/dl (ref 70–99)

## 2018-04-02 ENCOUNTER — Other Ambulatory Visit: Payer: Self-pay

## 2018-04-02 ENCOUNTER — Emergency Department (HOSPITAL_COMMUNITY)
Admission: EM | Admit: 2018-04-02 | Discharge: 2018-04-02 | Disposition: A | Discharge status: Home / Self Care | Payer: Medicare HMO | Attending: Emergency Medicine | Admitting: Emergency Medicine

## 2018-04-02 ENCOUNTER — Encounter (HOSPITAL_COMMUNITY): Payer: Self-pay | Admitting: *Deleted

## 2018-04-02 DIAGNOSIS — R51 Headache: Secondary | ICD-10-CM | POA: Diagnosis not present

## 2018-04-02 DIAGNOSIS — Z79899 Other long term (current) drug therapy: Secondary | ICD-10-CM | POA: Insufficient documentation

## 2018-04-02 DIAGNOSIS — Z7984 Long term (current) use of oral hypoglycemic drugs: Secondary | ICD-10-CM | POA: Diagnosis not present

## 2018-04-02 DIAGNOSIS — R05 Cough: Secondary | ICD-10-CM | POA: Diagnosis present

## 2018-04-02 DIAGNOSIS — E119 Type 2 diabetes mellitus without complications: Secondary | ICD-10-CM | POA: Diagnosis not present

## 2018-04-02 DIAGNOSIS — R519 Headache, unspecified: Secondary | ICD-10-CM

## 2018-04-02 DIAGNOSIS — I10 Essential (primary) hypertension: Secondary | ICD-10-CM | POA: Diagnosis not present

## 2018-04-02 DIAGNOSIS — F17210 Nicotine dependence, cigarettes, uncomplicated: Secondary | ICD-10-CM | POA: Insufficient documentation

## 2018-04-02 MED ORDER — DIPHENHYDRAMINE HCL 12.5 MG/5ML PO ELIX
25.0000 mg | ORAL_SOLUTION | Freq: Once | ORAL | Status: AC
  Administered 2018-04-02: 25 mg via ORAL
  Filled 2018-04-02: qty 10

## 2018-04-02 MED ORDER — DEXAMETHASONE 4 MG PO TABS: 4.0000 mg | ORAL_TABLET | Freq: Two times a day (BID) | ORAL | 0 refills | Status: DC

## 2018-04-02 MED ORDER — PROCHLORPERAZINE MALEATE 5 MG PO TABS
5.0000 mg | ORAL_TABLET | Freq: Once | ORAL | Status: AC
  Administered 2018-04-02: 5 mg via ORAL
  Filled 2018-04-02: qty 1

## 2018-04-02 MED ORDER — PREDNISONE 20 MG PO TABS
40.0000 mg | ORAL_TABLET | Freq: Once | ORAL | Status: AC
  Administered 2018-04-02: 40 mg via ORAL
  Filled 2018-04-02: qty 2

## 2018-04-02 MED ORDER — BUTALBITAL-APAP-CAFFEINE 50-325-40 MG PO TABS: 1.0000 | ORAL_TABLET | Freq: Four times a day (QID) | ORAL | 0 refills | Status: AC | PRN

## 2018-04-02 MED ORDER — LORATADINE-PSEUDOEPHEDRINE ER 5-120 MG PO TB12: 1.0000 | ORAL_TABLET | Freq: Two times a day (BID) | ORAL | 0 refills | Status: DC

## 2018-04-02 MED ORDER — IBUPROFEN 600 MG PO TABS: 600.0000 mg | ORAL_TABLET | Freq: Four times a day (QID) | ORAL | 0 refills | Status: DC

## 2018-04-02 MED ORDER — TRAMADOL HCL 50 MG PO TABS
100.0000 mg | ORAL_TABLET | Freq: Once | ORAL | Status: AC
  Administered 2018-04-02: 100 mg via ORAL
  Filled 2018-04-02: qty 2

## 2018-04-02 NOTE — Discharge Instructions (Signed)
Your examination suggest a sinus headache.  Please use Decadron and Claritin-D 2 times daily with a meal.  Use ibuprofen with breakfast, lunch, dinner, and at bedtime.  Use Fioricet every 6 hours as needed for more severe headache.  Fioricet may cause drowsiness, please use this medication with caution.  Please follow-up with Dr. Maudie Mercury to evaluate for your sinus condition as well as your headaches.

## 2018-04-02 NOTE — ED Triage Notes (Signed)
Pt c/o nausea, migraine headaches, decreased appetite, green/yellow nasal drainage, cough, diarrhea x 1 week. Denies vomiting. Pt has been using allergy medication and OTC cough syrup with no relief.

## 2018-04-02 NOTE — ED Provider Notes (Signed)
[facility] EMERGENCY DEPARTMENT Provider Note   CSN: [id number] Arrival date & time: 04/02/18  0941     History   Chief Complaint Chief Complaint  Patient presents with  . Cough  . Migraine    HPI [name] [name]. is a 57 y.o. male.  The history is provided by the patient.  Headache   This is a new problem. The current episode started more than 1 week ago. The problem occurs hourly. The problem has been gradually worsening. The headache is associated with coughing and bright light (nasal congestion). The pain is located in the frontal region. The quality of the pain is described as throbbing. The pain is moderate. The pain does not radiate. Associated symptoms include nausea and vomiting. Pertinent negatives include no near-syncope, no palpitations, no syncope and no shortness of breath. Treatments tried: OTC congestion medication. The treatment provided no relief.    Past Medical History:  Diagnosis Date  . Acid reflux   . Alcohol abuse    6-8 cans of beer daily; hx of incarceration for DWI  . Arthritis   . Bipolar disorder ([place])   . Bronchitis   . Chronic back pain   . Chronic neck pain   . Chronic pain   . Diabetes mellitus without complication ([place])   . Fracture of lower leg   . Gout   . Hyperlipidemia   . Hypertension   . Left arm pain    chronic  . Pancreatitis    March 2013  . Pseudocyst of pancreas 02/28/2012    Patient Active Problem List   Diagnosis Date Noted  . History of diabetes mellitus 10/10/2016  . Avascular necrosis of hip, left ([place]) 10/04/2016  . Status post left hip replacement 10/04/2016  . Rash and nonspecific skin eruption 08/12/2013  . Cholelithiases 08/04/2013  . Subcutaneous nodule 07/21/2013  . Acute alcoholic pancreatitis 90/24/0973  . Syncope 04/11/2013  . Prolonged Q-T interval on ECG 04/11/2013  . Cocaine abuse ([place]) 03/13/2013    Class: Chronic  . At risk for adverse drug event 02/14/2013  . DDD (degenerative disc disease),  lumbar 02/01/2013  . Dyspepsia 12/01/2012  . BPH (benign prostatic hyperplasia) 10/07/2012  . Allergic rhinitis 10/03/2012  . Transaminitis 10/02/2012  . Chronic pain syndrome 10/02/2012  . Essential hypertension, benign 07/01/2012  . Tobacco user 07/01/2012  . Hepatic steatosis 03/06/2012  . ED (erectile dysfunction) 03/06/2012  . Pseudocyst of pancreas 02/28/2012  . Alcohol abuse 02/25/2012  . GERD (gastroesophageal reflux disease) 02/23/2012  . Bipolar 1 disorder ([place]) 02/23/2012  . Hypertriglyceridemia 12/05/2011    Past Surgical History:  Procedure Laterality Date  . CIRCUMCISION  03/17/2012   Procedure: CIRCUMCISION ADULT;  Surgeon: Marissa Nestle, MD;  Location: [facility];  Service: Urology;  Laterality: N/A;  . COLONOSCOPY WITH PROPOFOL  12/24/2012   ZHG:DJME lesion as described above status post biopsy; otherwise normal rectum/Sigmoid diverticulosis/Sigmoid polyps -- resected as described above. anal lesion, benign. colon polyp, hyperplastic  . ESOPHAGOGASTRODUODENOSCOPY (EGD) WITH PROPOFOL  12/24/2012   QAS:TMHDQQI erythema and erosions of uncertain significance status post gastric biopsy (reactive gastropathy NO h.pylori)  . NOSE SURGERY     broken nose  . TOTAL HIP ARTHROPLASTY Left 10/04/2016   Procedure: LEFT TOTAL HIP ARTHROPLASTY ANTERIOR APPROACH;  Surgeon: Mcarthur Rossetti, MD;  Location: [facility];  Service: Orthopedics;  Laterality: Left;        Home Medications    Prior to Admission medications   Medication Sig Start Date  End Date Taking? Authorizing Provider  amLODipine (NORVASC) 5 MG tablet Take 1 tablet (5 mg total) by mouth daily. For high blood pressure control 07/21/13  Yes [place], Modena Nunnery, MD  fluticasone [facility]) 50 MCG/ACT nasal spray Place 1 spray into both nostrils 2 (two) times daily.   Yes [provider]  gabapentin (NEURONTIN) 300 MG capsule TAKE 1 CAPSULE BY MOUTH THREE TIMES A DAY. 05/12/17  Yes Carole Civil, MD  lisinopril  (PRINIVIL,ZESTRIL) 10 MG tablet Take 10 mg by mouth daily.  01/30/18  Yes [provider]  lovastatin (MEVACOR) 40 MG tablet Take 1 tablet by mouth daily. 01/28/17  Yes [provider]  metFORMIN (GLUCOPHAGE) 850 MG tablet Take 850 mg by mouth daily with breakfast.  01/30/18  Yes [provider]  Multiple Vitamin (MULTIVITAMIN WITH MINERALS) TABS Take 1 tablet by mouth daily.   Yes [provider]  omeprazole (PRILOSEC) 40 MG capsule Take 40 mg by mouth daily.  01/30/18  Yes [provider]  tamsulosin (FLOMAX) 0.4 MG CAPS Take 1 capsule (0.4 mg total) by mouth daily. For enlarged prostate 07/21/13  Yes [place], Modena Nunnery, MD  predniSONE (STERAPRED UNI-PAK 21 TAB) 5 MG (21) TBPK tablet Take 6 pills first day; 5 pills second day; 4 pills third day; 3 pills fourth day; 2 pills next day and 1 pill last day. Patient not taking: Reported on 04/02/2018 01/14/18   Sanjuana Kava, MD  promethazine (PHENERGAN) 25 MG suppository Place 1 suppository (25 mg total) rectally every 6 (six) hours as needed for nausea or vomiting. Patient not taking: Reported on 04/02/2018 01/06/18   Lily Kocher, PA-C    Family History Family History  Problem Relation Age of Onset  . Diabetes Father   . Anesthesia problems Neg Hx   . Hypotension Neg Hx   . Malignant hyperthermia Neg Hx   . Pseudochol deficiency Neg Hx   . Colon cancer Neg Hx   . Heart disease Neg Hx   . Stroke Neg Hx   . Cancer Neg Hx     Social History Social History   Tobacco Use  . Smoking status: Current Every Day Smoker    Packs/day: 0.50    Years: 25.00    Pack years: 12.50    Types: Cigarettes  . Smokeless tobacco: Former Systems developer    Types: Chew  Substance Use Topics  . Alcohol use: Yes    Alcohol/week: 0.0 oz    Comment: "about a 6 pack a day"  . Drug use: Not Currently    Types: Cocaine, Marijuana    Comment: last used months ago as of 04/02/18     Allergies   Penicillins   Review of  Systems Review of Systems  Constitutional: Positive for appetite change. Negative for activity change.       All ROS Neg except as noted in HPI  HENT: Positive for congestion, postnasal drip and sinus pressure. Negative for nosebleeds.   Eyes: Negative for photophobia and discharge.  Respiratory: Negative for cough, shortness of breath and wheezing.   Cardiovascular: Negative for chest pain, palpitations, syncope and near-syncope.  Gastrointestinal: Positive for diarrhea, nausea and vomiting. Negative for abdominal pain and blood in stool.  Genitourinary: Negative for dysuria, frequency and hematuria.  Musculoskeletal: Negative for arthralgias, back pain and neck pain.  Skin: Negative.   Neurological: Positive for headaches. Negative for dizziness, seizures and speech difficulty.  Psychiatric/Behavioral: Negative for confusion and hallucinations.     Physical Exam Updated  Vital Signs BP (!) 143/97   Pulse 74   Temp 98.3 F (36.8 C) (Oral)   Resp 16   Ht 5\' 10"  (1.778 m)   Wt 89.8 kg (198 lb)   SpO2 99%   BMI 28.41 kg/m   Physical Exam  Constitutional: He appears well-developed and well-nourished. No distress.  HENT:  Head: Normocephalic and atraumatic.  Right Ear: External ear normal.  Left Ear: External ear normal.  Nasal congestion present. There is pain to percussion over the sinuses.  Eyes: Conjunctivae are normal. Right eye exhibits no discharge. Left eye exhibits no discharge. No scleral icterus.  Neck: Neck supple. No tracheal deviation present.  Cardiovascular: Normal rate, regular rhythm and intact distal pulses.  Pulmonary/Chest: Effort normal and breath sounds normal. No stridor. No respiratory distress. He has no wheezes. He has no rales.  Abdominal: Soft. Bowel sounds are normal. He exhibits no distension. There is no tenderness. There is no rebound and no guarding.  Musculoskeletal: He exhibits no edema or tenderness.  Neurological: He is alert. He has normal  strength. No cranial nerve deficit (no facial droop, extraocular movements intact, no slurred speech) or sensory deficit. He exhibits normal muscle tone. He displays no seizure activity. Coordination normal.  Skin: Skin is warm and dry. No rash noted.  Psychiatric: He has a normal mood and affect.  Nursing note and vitals reviewed.    ED Treatments / Results  Labs (all labs ordered are listed, but only abnormal results are displayed) Labs Reviewed - No data to display  EKG None  Radiology No results found.  Procedures Procedures (including critical care time)  Medications Ordered in ED Medications - No data to display   Initial Impression / Assessment and Plan / ED Course  I have reviewed the triage vital signs and the nursing notes.  Pertinent labs & imaging results that were available during my care of the patient were reviewed by me and considered in my medical decision making (see chart for details).      Final Clinical Impressions(s) / ED Diagnoses MDM  There is no history of recent injury or trauma to the head.  The patient has been having problems with sinus congestion and sinus drainage.  He has pain to percussion over the sinuses.  There are no gross neurologic deficits appreciated.  I suspect that this is a sinus related headache.  The patient will be treated with Claritin-D and Decadron 2 times a day or every 12 hours.  He will use ibuprofen with breakfast, lunch, dinner, and at bedtime.  He will use Fioricet for more severe headaches.  I have asked the patient to follow-up with Dr. Maudie Mercury in the office.  Patient is in agreement with this plan.   Final diagnoses:  Sinus headache    ED Discharge Orders        Ordered    dexamethasone (DECADRON) 4 MG tablet  2 times daily with meals     04/02/18 1135    loratadine-pseudoephedrine (CLARITIN-D 12 HOUR) 5-120 MG tablet  2 times daily     04/02/18 1135    ibuprofen (ADVIL,MOTRIN) 600 MG tablet  4 times daily      04/02/18 1135    butalbital-acetaminophen-caffeine (FIORICET, ESGIC) 50-325-40 MG tablet  Every 6 hours PRN     04/02/18 1135       Lily Kocher, PA-C 04/02/18 1143    Mesner, Corene Cornea, MD 04/02/18 1247

## 2018-04-09 ENCOUNTER — Encounter (HOSPITAL_COMMUNITY): Payer: Self-pay | Admitting: Emergency Medicine

## 2018-04-09 ENCOUNTER — Emergency Department (HOSPITAL_COMMUNITY)
Admission: EM | Admit: 2018-04-09 | Discharge: 2018-04-09 | Disposition: A | Discharge status: Home / Self Care | Payer: Medicare HMO | Attending: Emergency Medicine | Admitting: Emergency Medicine

## 2018-04-09 ENCOUNTER — Emergency Department (HOSPITAL_COMMUNITY): Payer: Medicare HMO

## 2018-04-09 ENCOUNTER — Other Ambulatory Visit: Payer: Self-pay

## 2018-04-09 DIAGNOSIS — F17210 Nicotine dependence, cigarettes, uncomplicated: Secondary | ICD-10-CM | POA: Diagnosis not present

## 2018-04-09 DIAGNOSIS — G43819 Other migraine, intractable, without status migrainosus: Secondary | ICD-10-CM | POA: Insufficient documentation

## 2018-04-09 DIAGNOSIS — Z79899 Other long term (current) drug therapy: Secondary | ICD-10-CM | POA: Insufficient documentation

## 2018-04-09 DIAGNOSIS — I10 Essential (primary) hypertension: Secondary | ICD-10-CM | POA: Diagnosis not present

## 2018-04-09 DIAGNOSIS — E119 Type 2 diabetes mellitus without complications: Secondary | ICD-10-CM | POA: Insufficient documentation

## 2018-04-09 DIAGNOSIS — Z7984 Long term (current) use of oral hypoglycemic drugs: Secondary | ICD-10-CM | POA: Diagnosis not present

## 2018-04-09 DIAGNOSIS — R51 Headache: Secondary | ICD-10-CM | POA: Diagnosis present

## 2018-04-09 HISTORY — DX: Migraine, unspecified, not intractable, without status migrainosus: G43.909

## 2018-04-09 MED ORDER — SODIUM CHLORIDE 0.9 % IV BOLUS: 1000.0000 mL | Freq: Once | INTRAVENOUS | Status: DC

## 2018-04-09 MED ORDER — DIPHENHYDRAMINE HCL 50 MG/ML IJ SOLN
25.0000 mg | Freq: Once | INTRAMUSCULAR | Status: AC
  Administered 2018-04-09: 25 mg via INTRAVENOUS
  Filled 2018-04-09: qty 1

## 2018-04-09 MED ORDER — KETOROLAC TROMETHAMINE 30 MG/ML IJ SOLN
30.0000 mg | Freq: Once | INTRAMUSCULAR | Status: AC
  Administered 2018-04-09: 30 mg via INTRAVENOUS
  Filled 2018-04-09: qty 1

## 2018-04-09 MED ORDER — SODIUM CHLORIDE 0.9 % IV BOLUS
1000.0000 mL | Freq: Once | INTRAVENOUS | Status: AC
  Administered 2018-04-09: 1000 mL via INTRAVENOUS

## 2018-04-09 MED ORDER — FENTANYL CITRATE (PF) 100 MCG/2ML IJ SOLN
50.0000 ug | Freq: Once | INTRAMUSCULAR | Status: AC
  Administered 2018-04-09: 50 ug via INTRAVENOUS
  Filled 2018-04-09: qty 2

## 2018-04-09 MED ORDER — MORPHINE SULFATE (PF) 4 MG/ML IV SOLN
4.0000 mg | Freq: Once | INTRAVENOUS | Status: AC
  Administered 2018-04-09: 4 mg via INTRAVENOUS
  Filled 2018-04-09: qty 1

## 2018-04-09 NOTE — Discharge Instructions (Addendum)
Rest in quiet dark room.  Increase fluids.  Tylenol or ibuprofen for pain.

## 2018-04-09 NOTE — ED Triage Notes (Signed)
Pt c/op migraine with n/v, light sensitivity, and loss of appetite x 3 days. Pt with hx of migraine.

## 2018-04-09 NOTE — ED Provider Notes (Signed)
[facility] Provider Note   CSN: [id number] Arrival date & time: 04/09/18  1457     History   Chief Complaint Chief Complaint  Patient presents with  . Migraine    HPI [name] [name]. is a 57 y.o. male.  Patient presents with a migraine headache for the past 2 days.  Pain is located in the frontal area of his scalp with associated nausea, vomiting, photophobia.  He has a known history of migraines and this feels similar.  No neurological deficits or stiff neck.  He is tried nothing at home.  Severity is moderate.     Past Medical History:  Diagnosis Date  . Acid reflux   . Alcohol abuse    6-8 cans of beer daily; hx of incarceration for DWI  . Arthritis   . Bipolar disorder ([place])   . Bronchitis   . Chronic back pain   . Chronic neck pain   . Chronic pain   . Diabetes mellitus without complication ([place])   . Fracture of lower leg   . Gout   . Hyperlipidemia   . Hypertension   . Left arm pain    chronic  . Migraine   . Pancreatitis    March 2013  . Pseudocyst of pancreas 02/28/2012    Patient Active Problem List   Diagnosis Date Noted  . History of diabetes mellitus 10/10/2016  . Avascular necrosis of hip, left ([place]) 10/04/2016  . Status post left hip replacement 10/04/2016  . Rash and nonspecific skin eruption 08/12/2013  . Cholelithiases 08/04/2013  . Subcutaneous nodule 07/21/2013  . Acute alcoholic pancreatitis 24/23/5361  . Syncope 04/11/2013  . Prolonged Q-T interval on ECG 04/11/2013  . Cocaine abuse ([place]) 03/13/2013    Class: Chronic  . At risk for adverse drug event 02/14/2013  . DDD (degenerative disc disease), lumbar 02/01/2013  . Dyspepsia 12/01/2012  . BPH (benign prostatic hyperplasia) 10/07/2012  . Allergic rhinitis 10/03/2012  . Transaminitis 10/02/2012  . Chronic pain syndrome 10/02/2012  . Essential hypertension, benign 07/01/2012  . Tobacco user 07/01/2012  . Hepatic steatosis 03/06/2012  . ED (erectile  dysfunction) 03/06/2012  . Pseudocyst of pancreas 02/28/2012  . Alcohol abuse 02/25/2012  . GERD (gastroesophageal reflux disease) 02/23/2012  . Bipolar 1 disorder ([place]) 02/23/2012  . Hypertriglyceridemia 12/05/2011    Past Surgical History:  Procedure Laterality Date  . CIRCUMCISION  03/17/2012   Procedure: CIRCUMCISION ADULT;  Surgeon: Marissa Nestle, MD;  Location: [facility];  Service: Urology;  Laterality: N/A;  . COLONOSCOPY WITH PROPOFOL  12/24/2012   WER:XVQM lesion as described above status post biopsy; otherwise normal rectum/Sigmoid diverticulosis/Sigmoid polyps -- resected as described above. anal lesion, benign. colon polyp, hyperplastic  . ESOPHAGOGASTRODUODENOSCOPY (EGD) WITH PROPOFOL  12/24/2012   GQQ:PYPPJKD erythema and erosions of uncertain significance status post gastric biopsy (reactive gastropathy NO h.pylori)  . NOSE SURGERY     broken nose  . TOTAL HIP ARTHROPLASTY Left 10/04/2016   Procedure: LEFT TOTAL HIP ARTHROPLASTY ANTERIOR APPROACH;  Surgeon: Mcarthur Rossetti, MD;  Location: [facility];  Service: Orthopedics;  Laterality: Left;        Home Medications    Prior to Admission medications   Medication Sig Start Date End Date Taking? Authorizing Provider  amLODipine (NORVASC) 5 MG tablet Take 1 tablet (5 mg total) by mouth daily. For high blood pressure control 07/21/13  Yes [place], Modena Nunnery, MD  fluticasone [facility]) 50 MCG/ACT nasal spray Place 1 spray into  both nostrils 2 (two) times daily.   Yes [provider]  gabapentin (NEURONTIN) 300 MG capsule TAKE 1 CAPSULE BY MOUTH THREE TIMES A DAY. 05/12/17  Yes Carole Civil, MD  lisinopril (PRINIVIL,ZESTRIL) 10 MG tablet Take 10 mg by mouth daily.  01/30/18  Yes [provider]  lovastatin (MEVACOR) 40 MG tablet Take 1 tablet by mouth daily. 01/28/17  Yes [provider]  metFORMIN (GLUCOPHAGE) 850 MG tablet Take 850 mg by mouth daily with breakfast.  01/30/18  Yes [provider]  Multiple Vitamin (MULTIVITAMIN WITH MINERALS) TABS Take 1 tablet by mouth daily.   Yes [provider]  omeprazole (PRILOSEC) 40 MG capsule Take 40 mg by mouth daily.  01/30/18  Yes [provider]  traMADol (ULTRAM) 50 MG tablet Take 50 mg by mouth every 6 (six) hours as needed for moderate pain.   Yes [provider]  butalbital-acetaminophen-caffeine (FIORICET, ESGIC) [phone] MG tablet Take 1-2 tablets by mouth every 6 (six) hours as needed for headache. 04/02/18 04/02/19  Lily Kocher, PA-C  dexamethasone (DECADRON) 4 MG tablet Take 1 tablet (4 mg total) by mouth 2 (two) times daily with a meal. 04/02/18   Lily Kocher, PA-C  ibuprofen (ADVIL,MOTRIN) 600 MG tablet Take 1 tablet (600 mg total) by mouth 4 (four) times daily. 04/02/18   Lily Kocher, PA-C  loratadine-pseudoephedrine (CLARITIN-D 12 HOUR) 5-120 MG tablet Take 1 tablet by mouth 2 (two) times daily. 04/02/18   Lily Kocher, PA-C  predniSONE (STERAPRED UNI-PAK 21 TAB) 5 MG (21) TBPK tablet Take 6 pills first day; 5 pills second day; 4 pills third day; 3 pills fourth day; 2 pills next day and 1 pill last day. Patient not taking: Reported on 04/02/2018 01/14/18   Sanjuana Kava, MD    Family History Family History  Problem Relation Age of Onset  . Diabetes Father   . Anesthesia problems Neg Hx   . Hypotension Neg Hx   . Malignant hyperthermia Neg Hx   . Pseudochol deficiency Neg Hx   . Colon cancer Neg Hx   . Heart disease Neg Hx   . Stroke Neg Hx   . Cancer Neg Hx     Social History Social History   Tobacco Use  . Smoking status: Current Every Day Smoker    Packs/day: 0.50    Years: 25.00    Pack years: 12.50    Types: Cigarettes  . Smokeless tobacco: Former Systems developer    Types: Chew  Substance Use Topics  . Alcohol use: Yes    Alcohol/week: 0.0 oz    Comment: "about a 6 pack a day"  . Drug use: Not Currently    Types: Cocaine, Marijuana    Comment: last used months ago as  of 04/02/18     Allergies   Penicillins   Review of Systems Review of Systems  All other systems reviewed and are negative.    Physical Exam Updated Vital Signs BP 136/87 (BP Location: Right Arm)   Pulse 62   Temp (!) 97.5 F (36.4 C) (Oral)   Resp 16   Ht 5\' 10"  (1.778 m)   Wt 89.8 kg (198 lb)   SpO2 98%   BMI 28.41 kg/m   Physical Exam  Constitutional: He is oriented to person, place, and time. He appears well-developed and well-nourished.  HENT:  Head: Normocephalic and atraumatic.  Eyes: Conjunctivae are normal.  Neck: Neck supple.  Cardiovascular: Normal rate and regular rhythm.  Pulmonary/Chest: Effort  normal and breath sounds normal.  Abdominal: Soft. Bowel sounds are normal.  Musculoskeletal: Normal range of motion.  Neurological: He is alert and oriented to person, place, and time.  Skin: Skin is warm and dry.  Psychiatric: He has a normal mood and affect. His behavior is normal.  Nursing note and vitals reviewed.    ED Treatments / Results  Labs (all labs ordered are listed, but only abnormal results are displayed) Labs Reviewed - No data to display  EKG None  Radiology Dg Hip Unilat W Or Wo Pelvis 2-3 Views Left  Result Date: 04/09/2018 CLINICAL DATA:  LEFT hip pain for 2 days.  Slipped and fell. EXAM: DG HIP (WITH OR WITHOUT PELVIS) 2-3V LEFT COMPARISON:  12/02/2016. FINDINGS: LEFT total hip arthroplasty appears unremarkable without periprosthetic fracture, regional pelvic injury, or dislocation. IMPRESSION: Unchanged appearance from priors.  Unremarkable THA. Electronically Signed   By: Staci Righter M.D.   On: 04/09/2018 17:04    Procedures Procedures (including critical care time)  Medications Ordered in ED Medications  sodium chloride 0.9 % bolus 1,000 mL (0 mLs Intravenous Stopped 04/09/18 1748)  ketorolac (TORADOL) 30 MG/ML injection 30 mg (30 mg Intravenous Given 04/09/18 1625)  diphenhydrAMINE (BENADRYL) injection 25 mg (25 mg  Intravenous Given 04/09/18 1625)  morphine 4 MG/ML injection 4 mg (4 mg Intravenous Given 04/09/18 1812)  sodium chloride 0.9 % bolus 1,000 mL (0 mLs Intravenous Stopped 04/09/18 1916)  fentaNYL (SUBLIMAZE) injection 50 mcg (50 mcg Intravenous Given 04/09/18 2045)     Initial Impression / Assessment and Plan / ED Course  I have reviewed the triage vital signs and the nursing notes.  Pertinent labs & imaging results that were available during my care of the patient were reviewed by me and considered in my medical decision making (see chart for details).     Patient presents with frontal headache and photophobia.  No meningeal signs or neurological deficits.  He feels better after IV hydration and pain management.  He states he is significantly better at discharge.  No evidence of a stroke or neuro event.  Final Clinical Impressions(s) / ED Diagnoses   Final diagnoses:  Other migraine without status migrainosus, intractable    ED Discharge Orders    None       Nat Christen, MD 04/09/18 [phone]

## 2018-07-18 ENCOUNTER — Emergency Department (HOSPITAL_COMMUNITY): Payer: Medicare Other

## 2018-07-18 ENCOUNTER — Encounter (HOSPITAL_COMMUNITY): Payer: Self-pay | Admitting: Emergency Medicine

## 2018-07-18 ENCOUNTER — Emergency Department (HOSPITAL_COMMUNITY)
Admission: EM | Admit: 2018-07-18 | Discharge: 2018-07-18 | Disposition: A | Discharge status: Home / Self Care | Payer: Medicare Other | Attending: Emergency Medicine | Admitting: Emergency Medicine

## 2018-07-18 ENCOUNTER — Other Ambulatory Visit: Payer: Self-pay

## 2018-07-18 DIAGNOSIS — M25551 Pain in right hip: Secondary | ICD-10-CM | POA: Diagnosis not present

## 2018-07-18 DIAGNOSIS — M545 Low back pain, unspecified: Secondary | ICD-10-CM

## 2018-07-18 DIAGNOSIS — M542 Cervicalgia: Secondary | ICD-10-CM | POA: Diagnosis not present

## 2018-07-18 DIAGNOSIS — Z96642 Presence of left artificial hip joint: Secondary | ICD-10-CM | POA: Diagnosis not present

## 2018-07-18 DIAGNOSIS — F17210 Nicotine dependence, cigarettes, uncomplicated: Secondary | ICD-10-CM | POA: Insufficient documentation

## 2018-07-18 DIAGNOSIS — E119 Type 2 diabetes mellitus without complications: Secondary | ICD-10-CM | POA: Insufficient documentation

## 2018-07-18 DIAGNOSIS — I10 Essential (primary) hypertension: Secondary | ICD-10-CM | POA: Insufficient documentation

## 2018-07-18 DIAGNOSIS — Z7984 Long term (current) use of oral hypoglycemic drugs: Secondary | ICD-10-CM | POA: Diagnosis not present

## 2018-07-18 DIAGNOSIS — Z79899 Other long term (current) drug therapy: Secondary | ICD-10-CM | POA: Diagnosis not present

## 2018-07-18 DIAGNOSIS — W19XXXA Unspecified fall, initial encounter: Secondary | ICD-10-CM

## 2018-07-18 DIAGNOSIS — S199XXA Unspecified injury of neck, initial encounter: Secondary | ICD-10-CM | POA: Diagnosis not present

## 2018-07-18 DIAGNOSIS — Z471 Aftercare following joint replacement surgery: Secondary | ICD-10-CM | POA: Diagnosis not present

## 2018-07-18 HISTORY — DX: Other psychoactive substance abuse, uncomplicated: F19.10

## 2018-07-18 MED ORDER — METHOCARBAMOL 500 MG PO TABS: 1000.0000 mg | ORAL_TABLET | Freq: Four times a day (QID) | ORAL | 0 refills | Status: DC | PRN

## 2018-07-18 MED ORDER — METHOCARBAMOL 500 MG PO TABS
750.0000 mg | ORAL_TABLET | Freq: Once | ORAL | Status: AC
  Administered 2018-07-18: 750 mg via ORAL
  Filled 2018-07-18: qty 2

## 2018-07-18 MED ORDER — NAPROXEN 250 MG PO TABS: 250.0000 mg | ORAL_TABLET | Freq: Two times a day (BID) | ORAL | 0 refills | Status: DC | PRN

## 2018-07-18 NOTE — ED Notes (Signed)
Pt approached nurses desk wanted to know what was taking Dr so long.  Explained to pt that we have some extremely sick patients in the department and it may take a while.  Pt became agitated and started yelling out.  EDP approached pt in the hall and states she will be working on his discharge very soon, instructed pt to go back to room and wait for paperwork.  Pt walks toward waiting room and states "I will be in the lobby."

## 2018-07-18 NOTE — ED Provider Notes (Signed)
[facility] EMERGENCY DEPARTMENT Provider Note   CSN: [id number] Arrival date & time: 07/18/18  3662     History   Chief Complaint Chief Complaint  Patient presents with  . Fall    HPI Thayne Cindric. is a 57 y.o. male.  HPI Pt was seen at [facility]. Per pt, c/o gradual onset and persistence of constant acute flair of his chronic right sided neck and lower back "pains" since falling 2 days ago. Pt states he "stepped in a hole" and fell to his right side. Denies hitting head, no LOC, no abd pain, no N/V/D, no CP/SOB. Denies incont/retention of bowel or bladder, no saddle anesthesia, no focal motor weakness, no tingling/numbness in extremities, no fevers, no direct injury.   Past Medical History:  Diagnosis Date  . Acid reflux   . Alcohol abuse    6-8 cans of beer daily; hx of incarceration for DWI  . Arthritis   . Bipolar disorder ([place])   . Bronchitis   . Chronic back pain   . Chronic neck pain   . Chronic pain   . Diabetes mellitus without complication ([place])   . Fracture of lower leg   . Gout   . Hyperlipidemia   . Hypertension   . Left arm pain    chronic  . Migraine   . Pancreatitis    March 2013  . Pseudocyst of pancreas 02/28/2012  . Substance abuse [facility])     Patient Active Problem List   Diagnosis Date Noted  . History of diabetes mellitus 10/10/2016  . Avascular necrosis of hip, left ([place]) 10/04/2016  . Status post left hip replacement 10/04/2016  . Rash and nonspecific skin eruption 08/12/2013  . Cholelithiases 08/04/2013  . Subcutaneous nodule 07/21/2013  . Acute alcoholic pancreatitis [id number]/76/5465  . Syncope 04/11/2013  . Prolonged Q-T interval on ECG 04/11/2013  . Cocaine abuse ([place]) 03/13/2013    Class: Chronic  . At risk for adverse drug event 02/14/2013  . DDD (degenerative disc disease), lumbar 02/01/2013  . Dyspepsia 12/01/2012  . BPH (benign prostatic hyperplasia) 10/07/2012  . Allergic rhinitis 10/03/2012  . Transaminitis 10/02/2012  .  Chronic pain syndrome 10/02/2012  . Essential hypertension, benign 07/01/2012  . Tobacco user 07/01/2012  . Hepatic steatosis 03/06/2012  . ED (erectile dysfunction) 03/06/2012  . Pseudocyst of pancreas 02/28/2012  . Alcohol abuse 02/25/2012  . GERD (gastroesophageal reflux disease) 02/23/2012  . Bipolar 1 disorder ([place]) 02/23/2012  . Hypertriglyceridemia 12/05/2011    Past Surgical History:  Procedure Laterality Date  . CIRCUMCISION  03/17/2012   Procedure: CIRCUMCISION ADULT;  Surgeon: Marissa Nestle, MD;  Location: [facility];  Service: Urology;  Laterality: N/A;  . COLONOSCOPY WITH PROPOFOL  12/24/2012   KPT:WSFK lesion as described above status post biopsy; otherwise normal rectum/Sigmoid diverticulosis/Sigmoid polyps -- resected as described above. anal lesion, benign. colon polyp, hyperplastic  . ESOPHAGOGASTRODUODENOSCOPY (EGD) WITH PROPOFOL  12/24/2012   CLE:XNTZGYF erythema and erosions of uncertain significance status post gastric biopsy (reactive gastropathy NO h.pylori)  . NOSE SURGERY     broken nose  . TOTAL HIP ARTHROPLASTY Left 10/04/2016   Procedure: LEFT TOTAL HIP ARTHROPLASTY ANTERIOR APPROACH;  Surgeon: Mcarthur Rossetti, MD;  Location: [facility] ORS;  Service: Orthopedics;  Laterality: Left;        Home Medications    Prior to Admission medications   Medication Sig Start Date End Date Taking? Authorizing Provider  amLODipine (NORVASC) 5 MG tablet Take 1 tablet (5 mg total)  by mouth daily. For high blood pressure control 07/21/13   Dalton, Modena Nunnery, MD  butalbital-acetaminophen-caffeine [place], [place]) [phone] MG tablet Take 1-2 tablets by mouth every 6 (six) hours as needed for headache. 04/02/18 04/02/19  Lily Kocher, PA-C  dexamethasone (DECADRON) 4 MG tablet Take 1 tablet (4 mg total) by mouth 2 (two) times daily with a meal. 04/02/18   Lily Kocher, PA-C  fluticasone (FLONASE) 50 MCG/ACT nasal spray Place 1 spray into both nostrils 2 (two) times daily.     [provider]  gabapentin (NEURONTIN) 300 MG capsule TAKE 1 CAPSULE BY MOUTH THREE TIMES A DAY. 05/12/17   Carole Civil, MD  ibuprofen (ADVIL,MOTRIN) 600 MG tablet Take 1 tablet (600 mg total) by mouth 4 (four) times daily. 04/02/18   Lily Kocher, PA-C  lisinopril (PRINIVIL,ZESTRIL) 10 MG tablet Take 10 mg by mouth daily.  01/30/18   [provider]  loratadine-pseudoephedrine (CLARITIN-D 12 HOUR) 5-120 MG tablet Take 1 tablet by mouth 2 (two) times daily. 04/02/18   Lily Kocher, PA-C  lovastatin (MEVACOR) 40 MG tablet Take 1 tablet by mouth daily. 01/28/17   [provider]  metFORMIN (GLUCOPHAGE) 850 MG tablet Take 850 mg by mouth daily with breakfast.  01/30/18   [provider]  Multiple Vitamin (MULTIVITAMIN WITH MINERALS) TABS Take 1 tablet by mouth daily.    [provider]  omeprazole (PRILOSEC) 40 MG capsule Take 40 mg by mouth daily.  01/30/18   [provider]  predniSONE (STERAPRED UNI-PAK 21 TAB) 5 MG (21) TBPK tablet Take 6 pills first day; 5 pills second day; 4 pills third day; 3 pills fourth day; 2 pills next day and 1 pill last day. Patient not taking: Reported on 04/02/2018 01/14/18   Sanjuana Kava, MD  traMADol (ULTRAM) 50 MG tablet Take 50 mg by mouth every 6 (six) hours as needed for moderate pain.    [provider]    Family History Family History  Problem Relation Age of Onset  . Diabetes Father   . Anesthesia problems Neg Hx   . Hypotension Neg Hx   . Malignant hyperthermia Neg Hx   . Pseudochol deficiency Neg Hx   . Colon cancer Neg Hx   . Heart disease Neg Hx   . Stroke Neg Hx   . Cancer Neg Hx     Social History Social History   Tobacco Use  . Smoking status: Current Every Day Smoker    Packs/day: 0.50    Years: 25.00    Pack years: 12.50    Types: Cigarettes  . Smokeless tobacco: Former Systems developer    Types: Chew  Substance Use Topics  . Alcohol use: Yes    Comment: "about a 6 pack a day"    . Drug use: Not Currently    Types: Cocaine, Marijuana    Comment: last used months ago as of 04/02/18     Allergies   Penicillins   Review of Systems Review of Systems ROS: Statement: All systems negative except as marked or noted in the HPI; Constitutional: Negative for fever and chills. ; ; Eyes: Negative for eye pain, redness and discharge. ; ; ENMT: Negative for ear pain, hoarseness, nasal congestion, sinus pressure and sore throat. ; ; Cardiovascular: Negative for chest pain, palpitations, diaphoresis, dyspnea and peripheral edema. ; ; Respiratory: Negative for cough, wheezing and stridor. ; ; Gastrointestinal: Negative for nausea, vomiting, diarrhea, abdominal pain, blood in stool, hematemesis, jaundice and rectal bleeding. . ; ;  Genitourinary: Negative for dysuria, flank pain and hematuria. ; ; Musculoskeletal: +hip pain, back pain and neck pain. Negative for swelling and deformity.; ; Skin: Negative for pruritus, rash, abrasions, blisters, bruising and skin lesion.; ; Neuro: Negative for headache, lightheadedness and neck stiffness. Negative for weakness, altered level of consciousness, altered mental status, extremity weakness, paresthesias, involuntary movement, seizure and syncope.       Physical Exam Updated Vital Signs BP (!) 125/91 (BP Location: Right Arm)   Pulse 100   Temp 98.4 F (36.9 C) (Oral)   Resp 16   Ht 5\' 10"  (1.778 m)   Wt 87.5 kg (193 lb)   SpO2 96%   BMI 27.69 kg/m   Physical Exam 0945: Physical examination:  Nursing notes reviewed; Vital signs and O2 SAT reviewed;  Constitutional: Well developed, Well nourished, Well hydrated, In no acute distress; Head:  Normocephalic, atraumatic; Eyes: EOMI, PERRL, No scleral icterus; ENMT: Mouth and pharynx normal, Mucous membranes moist; Neck: Supple, Full range of motion, No lymphadenopathy; Cardiovascular: Regular rate and rhythm, No gallop; Respiratory: Breath sounds clear & equal bilaterally, No wheezes.   Speaking full sentences with ease, Normal respiratory effort/excursion; Chest: Nontender, Movement normal; Abdomen: Soft, Nontender, Nondistended, Normal bowel sounds; Genitourinary: No CVA tenderness; Spine:  No midline CS, TS, LS tenderness. +TTP right hypertonic trapezius muscle. +TTP right lower lumbar paraspinal muscles.;; Extremities: Peripheral pulses normal, Pelvis stable. NT right hip/knee/ankle/foot. NT right shoulder/elbow/wrist/hand. +very small, healed, superficial abrasions to right anterior tibial area and right bicep area.  No edema, No calf edema or asymmetry.; Neuro: AA&Ox3, Major CN grossly intact.  Speech clear. No gross focal motor or sensory deficits in extremities.; Skin: Color normal, Warm, Dry.    ED Treatments / Results  Labs (all labs ordered are listed, but only abnormal results are displayed)   EKG None  Radiology   Procedures Procedures (including critical care time)  Medications Ordered in ED Medications  methocarbamol (ROBAXIN) tablet 750 mg (750 mg Oral Given 07/18/18 1121)     Initial Impression / Assessment and Plan / ED Course  I have reviewed the triage vital signs and the nursing notes.  Pertinent labs & imaging results that were available during my care of the patient were reviewed by me and considered in my medical decision making (see chart for details).  MDM Reviewed: previous chart, nursing note and vitals Interpretation: x-ray and CT scan    Dg Lumbar Spine Complete Result Date: 07/18/2018 CLINICAL DATA:  Fall 2 days ago with lower back pain, initial encounter EXAM: LUMBAR SPINE - COMPLETE 4+ VIEW COMPARISON:  07/22/2017 FINDINGS: Vertebral body height is well maintained. No pars defects are noted. No new significant disc pathology is seen. Mild osteophytic changes are seen. No soft tissue abnormality is noted. Left hip replacement is seen. IMPRESSION: Degenerative changes without acute abnormality. Electronically Signed   By: Inez Catalina  M.D.   On: 07/18/2018 11:46   Ct Cervical Spine Wo Contrast Result Date: 07/18/2018 CLINICAL DATA:  Recent fall.  Right neck pain. EXAM: CT CERVICAL SPINE WITHOUT CONTRAST TECHNIQUE: Multidetector CT imaging of the cervical spine was performed without intravenous contrast. Multiplanar CT image reconstructions were also generated. COMPARISON:  Cervical spine radiographs on 08/15/2011 FINDINGS: Alignment: Normal. Skull base and vertebrae: No acute fracture or subluxation identified. No bony lesions or destruction. Soft tissues and spinal canal: No prevertebral soft tissue swelling identified. Incidental 7 mm hyperdense nodule in the superficial aspect of the inferior right parotid gland. This may represent  an intraparotid lymph node or small parotid lesion such as a Warthin's tumor. Elective ENT evaluation recommended. There are some scattered small cervical lymph nodes bilaterally. None are enlarged. Disc levels: Since 2012, there has been some progression of degenerative disc disease at C3-4 and C5-6. Milder degenerative disease is present at C4-5. Upper chest: Negative. IMPRESSION: 1. No acute cervical injury identified. 2. Some progression of degenerative disc disease at C3-4 and C5-6 since 2012. Milder degenerative disc disease at C4-5. 3. Incidental 7 mm hyperdense nodular lesion of the superficial inferior right parotid gland. This may represent intraparotid lymph node or small parotid lesion. Elective ENT evaluation recommended. Electronically Signed   By: Aletta Edouard M.D.   On: 07/18/2018 10:37   Dg Hip Unilat With Pelvis 2-3 Views Right Result Date: 07/18/2018 CLINICAL DATA:  57 year old male with a history of right neck and back pain EXAM: DG HIP (WITH OR WITHOUT PELVIS) 2-3V RIGHT COMPARISON:  None. FINDINGS: Bony pelvic ring intact.  No acute displaced fracture. Surgical changes of left hip arthroplasty. Surgical components appear congruent. Right hip projects normally over the acetabula.  Unremarkable appearance of the proximal right femur. IMPRESSION: Negative for acute bony abnormality. Left hip arthroplasty Electronically Signed   By: Corrie Mckusick D.O.   On: 07/18/2018 11:45    1215:  CT/XR reassuring.  Pt has been ambulatory around the ED with steady gait, easy resps, NAD. Pt loudly complaining in hallway that he "wants different pain meds." Hx substance abuse and chronic pain, will not rx narcotics for msk pain. Pt then states he wants to go home right now. Dx and testing d/w pt.  Questions answered.  Verb understanding, agreeable to d/c home with outpt f/u.    Final Clinical Impressions(s) / ED Diagnoses   Final diagnoses:  Fall, initial encounter    ED Discharge Orders    None       Francine Graven, DO 07/20/18 1227

## 2018-07-18 NOTE — Discharge Instructions (Signed)
Take the prescriptions as directed.  Apply moist heat or ice to the area(s) of discomfort, for 15 minutes at a time, several times per day for the next few days.  Do not fall asleep on a heating or ice pack. Your CT scan showed an incidental finding: "Incidental 7 mm hyperdense nodular lesion of the superficial inferior right parotid gland. This may represent intraparotid lymph node or small parotid lesion. Elective ENT evaluation recommended."   Call your regular medical doctor on Monday to schedule a follow up appointment this week. Call the ENT doctor on Monday to schedule a follow up appointment within the next week for your CT finding.  Return to the Emergency Department immediately sooner if worsening.

## 2018-07-18 NOTE — ED Triage Notes (Signed)
Patient c/o right neck, lower back, right arm, and right leg pain. Per patient fell on Thursday after stepping in hole. Per patient hit head but denies any LOC, dizziness, or neurological deficits. Denies taking any type of blood thinner. Patient ambulatory. Denies any complications with BM or urination.

## 2018-07-27 ENCOUNTER — Encounter (HOSPITAL_COMMUNITY): Payer: Self-pay | Admitting: Emergency Medicine

## 2018-07-27 ENCOUNTER — Emergency Department (HOSPITAL_COMMUNITY)
Admission: EM | Admit: 2018-07-27 | Discharge: 2018-07-27 | Disposition: A | Discharge status: Home / Self Care | Payer: Medicare Other | Attending: Emergency Medicine | Admitting: Emergency Medicine

## 2018-07-27 ENCOUNTER — Other Ambulatory Visit: Payer: Self-pay

## 2018-07-27 DIAGNOSIS — J309 Allergic rhinitis, unspecified: Secondary | ICD-10-CM | POA: Diagnosis not present

## 2018-07-27 DIAGNOSIS — E118 Type 2 diabetes mellitus with unspecified complications: Secondary | ICD-10-CM | POA: Diagnosis not present

## 2018-07-27 DIAGNOSIS — E119 Type 2 diabetes mellitus without complications: Secondary | ICD-10-CM | POA: Insufficient documentation

## 2018-07-27 DIAGNOSIS — F17210 Nicotine dependence, cigarettes, uncomplicated: Secondary | ICD-10-CM | POA: Diagnosis not present

## 2018-07-27 DIAGNOSIS — M549 Dorsalgia, unspecified: Secondary | ICD-10-CM | POA: Diagnosis present

## 2018-07-27 DIAGNOSIS — Z79899 Other long term (current) drug therapy: Secondary | ICD-10-CM | POA: Diagnosis not present

## 2018-07-27 DIAGNOSIS — M545 Low back pain, unspecified: Secondary | ICD-10-CM

## 2018-07-27 DIAGNOSIS — M542 Cervicalgia: Secondary | ICD-10-CM | POA: Insufficient documentation

## 2018-07-27 DIAGNOSIS — Z7984 Long term (current) use of oral hypoglycemic drugs: Secondary | ICD-10-CM | POA: Insufficient documentation

## 2018-07-27 DIAGNOSIS — I10 Essential (primary) hypertension: Secondary | ICD-10-CM | POA: Diagnosis not present

## 2018-07-27 DIAGNOSIS — K219 Gastro-esophageal reflux disease without esophagitis: Secondary | ICD-10-CM | POA: Diagnosis not present

## 2018-07-27 LAB — CBG MONITORING, ED: Glucose-Capillary: 111 mg/dL — ABNORMAL HIGH (ref 70–99)

## 2018-07-27 MED ORDER — CYCLOBENZAPRINE HCL 10 MG PO TABS
10.0000 mg | ORAL_TABLET | Freq: Once | ORAL | Status: AC
  Administered 2018-07-27: 10 mg via ORAL
  Filled 2018-07-27: qty 1

## 2018-07-27 MED ORDER — CYCLOBENZAPRINE HCL 5 MG PO TABS: 5.0000 mg | ORAL_TABLET | Freq: Three times a day (TID) | ORAL | 0 refills | Status: DC | PRN

## 2018-07-27 MED ORDER — PREDNISONE 50 MG PO TABS
60.0000 mg | ORAL_TABLET | Freq: Once | ORAL | Status: AC
  Administered 2018-07-27: 60 mg via ORAL
  Filled 2018-07-27: qty 1

## 2018-07-27 MED ORDER — PREDNISONE 10 MG PO TABS: ORAL_TABLET | ORAL | 0 refills | Status: DC

## 2018-07-27 NOTE — ED Triage Notes (Signed)
Pt seen 7/27 for same as today, neck and lower back pain from fall. Nad.

## 2018-07-27 NOTE — Discharge Instructions (Addendum)
Take your next dose of prednisone tomorrow evening.  Use the the other medicines as directed.  Do not drive within 4 hours of taking flexeril as this can make you drowsy.  Avoid lifting,  Bending,  Twisting or any other activity that worsens your pain over the next week.  Apply a heating pad to your neck and your lower back for 20 minutes several times.  You should get rechecked if your symptoms are not better over the next 5 days,  Or you develop increased pain,  Weakness in your leg(s) or loss of bladder or bowel function - these are symptoms of a worsening condition.  Your exam today is reassuring that you do not have a surgical emergency today.

## 2018-07-29 NOTE — ED Provider Notes (Signed)
[facility] EMERGENCY DEPARTMENT Provider Note   CSN: [id number] Arrival date & time: 07/27/18  1637     History   Chief Complaint Chief Complaint  Patient presents with  . Back Pain    HPI [name] [name]. is a 57 y.o. male who has a history of chronic back and neck pain returns for recheck of pain from a recent fall.  He was seen here for same on 7/27 at which time his xrays and ct imaging of c spine were stable.  He was prescribed robaxin and naproxen which has not relieved his pain.  He has chronic sciatica which is not new or different from baseline. There has been no weakness or numbness in the lower extremities and no urinary or bowel retention or incontinence.   .  The history is provided by the patient.    Past Medical History:  Diagnosis Date  . Acid reflux   . Alcohol abuse    6-8 cans of beer daily; hx of incarceration for DWI  . Arthritis   . Bipolar disorder ([place])   . Bronchitis   . Chronic back pain   . Chronic neck pain   . Chronic pain   . Diabetes mellitus without complication ([place])   . Fracture of lower leg   . Gout   . Hyperlipidemia   . Hypertension   . Left arm pain    chronic  . Migraine   . Pancreatitis    March 2013  . Pseudocyst of pancreas 02/28/2012  . Substance abuse [facility])     Patient Active Problem List   Diagnosis Date Noted  . History of diabetes mellitus 10/10/2016  . Avascular necrosis of hip, left ([place]) 10/04/2016  . Status post left hip replacement 10/04/2016  . Rash and nonspecific skin eruption 08/12/2013  . Cholelithiases 08/04/2013  . Subcutaneous nodule 07/21/2013  . Acute alcoholic pancreatitis 88/41/6606  . Syncope 04/11/2013  . Prolonged Q-T interval on ECG 04/11/2013  . Cocaine abuse ([place]) 03/13/2013    Class: Chronic  . At risk for adverse drug event 02/14/2013  . DDD (degenerative disc disease), lumbar 02/01/2013  . Dyspepsia 12/01/2012  . BPH (benign prostatic hyperplasia) 10/07/2012  . Allergic rhinitis  10/03/2012  . Transaminitis 10/02/2012  . Chronic pain syndrome 10/02/2012  . Essential hypertension, benign 07/01/2012  . Tobacco user 07/01/2012  . Hepatic steatosis 03/06/2012  . ED (erectile dysfunction) 03/06/2012  . Pseudocyst of pancreas 02/28/2012  . Alcohol abuse 02/25/2012  . GERD (gastroesophageal reflux disease) 02/23/2012  . Bipolar 1 disorder ([place]) 02/23/2012  . Hypertriglyceridemia 12/05/2011    Past Surgical History:  Procedure Laterality Date  . CIRCUMCISION  03/17/2012   Procedure: CIRCUMCISION ADULT;  Surgeon: Marissa Nestle, MD;  Location: [facility];  Service: Urology;  Laterality: N/A;  . COLONOSCOPY WITH PROPOFOL  12/24/2012   TKZ:SWFU lesion as described above status post biopsy; otherwise normal rectum/Sigmoid diverticulosis/Sigmoid polyps -- resected as described above. anal lesion, benign. colon polyp, hyperplastic  . ESOPHAGOGASTRODUODENOSCOPY (EGD) WITH PROPOFOL  12/24/2012   XNA:TFTDDUK erythema and erosions of uncertain significance status post gastric biopsy (reactive gastropathy NO h.pylori)  . NOSE SURGERY     broken nose  . TOTAL HIP ARTHROPLASTY Left 10/04/2016   Procedure: LEFT TOTAL HIP ARTHROPLASTY ANTERIOR APPROACH;  Surgeon: Mcarthur Rossetti, MD;  Location: [facility]S;  Service: Orthopedics;  Laterality: Left;        Home Medications    Prior to Admission medications   Medication Sig  Start Date End Date Taking? Authorizing Provider  amLODipine (NORVASC) 5 MG tablet Take 1 tablet (5 mg total) by mouth daily. For high blood pressure control 07/21/13   [place], [place] Nunnery, MD  butalbital-acetaminophen-caffeine [place], [place]) [phone] MG tablet Take 1-2 tablets by mouth every 6 (six) hours as needed for headache. 04/02/18 04/02/19  Lily Kocher, PA-C  cyclobenzaprine (FLEXERIL) 5 MG tablet Take 1 tablet (5 mg total) by mouth 3 (three) times daily as needed for muscle spasms. 07/27/18   Evalee Jefferson, PA-C  dexamethasone (DECADRON) 4 MG tablet Take 1  tablet (4 mg total) by mouth 2 (two) times daily with a meal. 04/02/18   Lily Kocher, PA-C  fluticasone (FLONASE) 50 MCG/ACT nasal spray Place 1 spray into both nostrils 2 (two) times daily.    [provider]  gabapentin (NEURONTIN) 300 MG capsule TAKE 1 CAPSULE BY MOUTH THREE TIMES A DAY. 05/12/17   Carole Civil, MD  ibuprofen (ADVIL,MOTRIN) 600 MG tablet Take 1 tablet (600 mg total) by mouth 4 (four) times daily. 04/02/18   Lily Kocher, PA-C  lisinopril (PRINIVIL,ZESTRIL) 10 MG tablet Take 10 mg by mouth daily.  01/30/18   [provider]  loratadine-pseudoephedrine (CLARITIN-D 12 HOUR) 5-120 MG tablet Take 1 tablet by mouth 2 (two) times daily. 04/02/18   Lily Kocher, PA-C  lovastatin (MEVACOR) 40 MG tablet Take 1 tablet by mouth daily. 01/28/17   [provider]  metFORMIN (GLUCOPHAGE) 850 MG tablet Take 850 mg by mouth daily with breakfast.  01/30/18   [provider]  methocarbamol (ROBAXIN) 500 MG tablet Take 2 tablets (1,000 mg total) by mouth 4 (four) times daily as needed for muscle spasms (muscle spasm/pain). 07/18/18   Francine Graven, DO  Multiple Vitamin (MULTIVITAMIN WITH MINERALS) TABS Take 1 tablet by mouth daily.    [provider]  naproxen (NAPROSYN) 250 MG tablet Take 1 tablet (250 mg total) by mouth 2 (two) times daily as needed for mild pain or moderate pain (take with food). 07/18/18   Francine Graven, DO  omeprazole (PRILOSEC) 40 MG capsule Take 40 mg by mouth daily.  01/30/18   [provider]  predniSONE (DELTASONE) 10 MG tablet Take 6 tablets day one, 5 tablets day two, 4 tablets day three, 3 tablets day four, 2 tablets day five, then 1 tablet day six 07/27/18   Zoua Caporaso, Almyra Free, PA-C  traMADol (ULTRAM) 50 MG tablet Take 50 mg by mouth every 6 (six) hours as needed for moderate pain.    [provider]    Family History Family History  Problem Relation Age of Onset  . Diabetes Father   . Anesthesia problems  Neg Hx   . Hypotension Neg Hx   . Malignant hyperthermia Neg Hx   . Pseudochol deficiency Neg Hx   . Colon cancer Neg Hx   . Heart disease Neg Hx   . Stroke Neg Hx   . Cancer Neg Hx     Social History Social History   Tobacco Use  . Smoking status: Current Every Day Smoker    Packs/day: 0.50    Years: 25.00    Pack years: 12.50    Types: Cigarettes  . Smokeless tobacco: Former Systems developer    Types: Chew  Substance Use Topics  . Alcohol use: Yes    Comment: "about a 6 pack a day"  . Drug use: Not Currently    Types: Cocaine, Marijuana    Comment: last used months ago as of 04/02/18  Allergies   Penicillins   Review of Systems Review of Systems  Constitutional: Negative for fever.  Respiratory: Negative for shortness of breath.   Cardiovascular: Negative for chest pain and leg swelling.  Gastrointestinal: Negative for abdominal distention, abdominal pain and constipation.  Genitourinary: Negative for difficulty urinating, dysuria, flank pain, frequency and urgency.  Musculoskeletal: Positive for back pain and neck pain. Negative for gait problem and joint swelling.  Skin: Negative for rash.  Neurological: Negative for weakness and numbness.     Physical Exam Updated Vital Signs BP 125/62 (BP Location: Right Arm)   Pulse 78   Temp 98.4 F (36.9 C) (Temporal)   Resp 20   Ht 5\' 10"  (1.778 m)   Wt 87.5 kg (193 lb)   SpO2 99%   BMI 27.69 kg/m   Physical Exam  Constitutional: He is oriented to person, place, and time. He appears well-developed and well-nourished.  HENT:  Head: Normocephalic.  Eyes: Conjunctivae are normal.  Neck: Normal range of motion. Neck supple.  Cardiovascular: Normal rate and intact distal pulses.  Pedal pulses normal.  Pulmonary/Chest: Effort normal.  Abdominal: Soft. Bowel sounds are normal. He exhibits no distension and no mass.  Musculoskeletal: Normal range of motion. He exhibits no edema.       Lumbar back: He exhibits tenderness.  He exhibits no swelling, no edema and no spasm.  Neurological: He is alert and oriented to person, place, and time. He has normal strength. He displays no atrophy, no tremor and normal reflexes. No sensory deficit. Gait normal.  Reflex Scores:      Patellar reflexes are 2+ on the right side and 2+ on the left side.      Achilles reflexes are 2+ on the right side and 2+ on the left side. No strength deficit noted in hip and knee flexor and extensor muscle groups.  Ankle flexion and extension intact. Equal grip strength.  Skin: Skin is warm and dry.  Psychiatric: He has a normal mood and affect.  Nursing note and vitals reviewed.    ED Treatments / Results  Labs (all labs ordered are listed, but only abnormal results are displayed) Labs Reviewed  CBG MONITORING, ED - Abnormal; Notable for the following components:      Result Value   Glucose-Capillary 111 (*)    All other components within normal limits    EKG None  Radiology No results found.  Procedures Procedures (including critical care time)  Medications Ordered in ED Medications  predniSONE (DELTASONE) tablet 60 mg (60 mg Oral Given 07/27/18 1802)  cyclobenzaprine (FLEXERIL) tablet 10 mg (10 mg Oral Given 07/27/18 1802)     Initial Impression / Assessment and Plan / ED Course  I have reviewed the triage vital signs and the nursing notes.  Pertinent labs & imaging results that were available during my care of the patient were reviewed by me and considered in my medical decision making (see chart for details).     No neuro deficit on exam or by history to suggest emergent or surgical presentation.  Discussed worsened sx that should prompt immediate re-evaluation including distal weakness, bowel/bladder retention/incontinence. Pt has not responded to naproxen and robaxin, will switch to prednisone taper and flexeril. Heat tx. pcp f/u for persistent sx.   Final Clinical Impressions(s) / ED Diagnoses   Final diagnoses:    Acute right-sided low back pain without sciatica  Neck pain    ED Discharge Orders        Ordered  cyclobenzaprine (FLEXERIL) 5 MG tablet  3 times daily PRN     07/27/18 1759    predniSONE (DELTASONE) 10 MG tablet     07/27/18 1759       Evalee Jefferson, PA-C 07/29/18 1352    Mesner, Corene Cornea, MD 07/29/18 1551

## 2018-09-02 DIAGNOSIS — E559 Vitamin D deficiency, unspecified: Secondary | ICD-10-CM | POA: Diagnosis not present

## 2018-09-02 DIAGNOSIS — M545 Low back pain: Secondary | ICD-10-CM | POA: Diagnosis not present

## 2018-09-02 DIAGNOSIS — G4700 Insomnia, unspecified: Secondary | ICD-10-CM | POA: Diagnosis not present

## 2018-09-02 DIAGNOSIS — M542 Cervicalgia: Secondary | ICD-10-CM | POA: Diagnosis not present

## 2018-09-02 DIAGNOSIS — E119 Type 2 diabetes mellitus without complications: Secondary | ICD-10-CM | POA: Diagnosis not present

## 2018-09-02 DIAGNOSIS — E785 Hyperlipidemia, unspecified: Secondary | ICD-10-CM | POA: Diagnosis not present

## 2018-09-02 DIAGNOSIS — I10 Essential (primary) hypertension: Secondary | ICD-10-CM | POA: Diagnosis not present

## 2018-09-02 DIAGNOSIS — Z79899 Other long term (current) drug therapy: Secondary | ICD-10-CM | POA: Diagnosis not present

## 2018-10-15 DIAGNOSIS — E1140 Type 2 diabetes mellitus with diabetic neuropathy, unspecified: Secondary | ICD-10-CM | POA: Diagnosis not present

## 2018-10-15 DIAGNOSIS — E559 Vitamin D deficiency, unspecified: Secondary | ICD-10-CM | POA: Diagnosis not present

## 2018-10-15 DIAGNOSIS — I10 Essential (primary) hypertension: Secondary | ICD-10-CM | POA: Diagnosis not present

## 2018-10-15 DIAGNOSIS — E785 Hyperlipidemia, unspecified: Secondary | ICD-10-CM | POA: Diagnosis not present

## 2018-12-08 DIAGNOSIS — I10 Essential (primary) hypertension: Secondary | ICD-10-CM | POA: Diagnosis not present

## 2018-12-08 DIAGNOSIS — E782 Mixed hyperlipidemia: Secondary | ICD-10-CM | POA: Diagnosis not present

## 2018-12-08 DIAGNOSIS — Z0001 Encounter for general adult medical examination with abnormal findings: Secondary | ICD-10-CM | POA: Diagnosis not present

## 2018-12-08 DIAGNOSIS — E1140 Type 2 diabetes mellitus with diabetic neuropathy, unspecified: Secondary | ICD-10-CM | POA: Diagnosis not present

## 2019-01-21 ENCOUNTER — Emergency Department (HOSPITAL_COMMUNITY)
Admission: EM | Admit: 2019-01-21 | Discharge: 2019-01-21 | Disposition: A | Discharge status: Home / Self Care | Payer: Medicare Other | Attending: Emergency Medicine | Admitting: Emergency Medicine

## 2019-01-21 ENCOUNTER — Encounter (HOSPITAL_COMMUNITY): Payer: Self-pay | Admitting: Emergency Medicine

## 2019-01-21 ENCOUNTER — Other Ambulatory Visit: Payer: Self-pay

## 2019-01-21 DIAGNOSIS — E119 Type 2 diabetes mellitus without complications: Secondary | ICD-10-CM | POA: Insufficient documentation

## 2019-01-21 DIAGNOSIS — R197 Diarrhea, unspecified: Secondary | ICD-10-CM | POA: Diagnosis not present

## 2019-01-21 DIAGNOSIS — Z96642 Presence of left artificial hip joint: Secondary | ICD-10-CM | POA: Insufficient documentation

## 2019-01-21 DIAGNOSIS — F17210 Nicotine dependence, cigarettes, uncomplicated: Secondary | ICD-10-CM | POA: Insufficient documentation

## 2019-01-21 DIAGNOSIS — A09 Infectious gastroenteritis and colitis, unspecified: Secondary | ICD-10-CM | POA: Diagnosis not present

## 2019-01-21 LAB — URINALYSIS, ROUTINE W REFLEX MICROSCOPIC
Bacteria, UA: NONE SEEN
Bilirubin Urine: NEGATIVE
Glucose, UA: NEGATIVE mg/dL
Ketones, ur: NEGATIVE mg/dL
Leukocytes, UA: NEGATIVE
Nitrite: NEGATIVE
Protein, ur: 30 mg/dL — AB
Specific Gravity, Urine: 1.028 (ref 1.005–1.030)
pH: 6 (ref 5.0–8.0)

## 2019-01-21 LAB — COMPREHENSIVE METABOLIC PANEL
ALT: 25 U/L (ref 0–44)
AST: 47 U/L — ABNORMAL HIGH (ref 15–41)
Albumin: 4.4 g/dL (ref 3.5–5.0)
Alkaline Phosphatase: 30 U/L — ABNORMAL LOW (ref 38–126)
Anion gap: 12 (ref 5–15)
BUN: 13 mg/dL (ref 6–20)
CO2: 24 mmol/L (ref 22–32)
Calcium: 9.2 mg/dL (ref 8.9–10.3)
Chloride: 102 mmol/L (ref 98–111)
Creatinine, Ser: 0.96 mg/dL (ref 0.61–1.24)
GFR calc Af Amer: >60 mL/min (ref >60)
GFR calc non Af Amer: >60 mL/min (ref >60)
Glucose, Bld: 102 mg/dL — ABNORMAL HIGH (ref 70–99)
Potassium: 3.8 mmol/L (ref 3.5–5.1)
Sodium: 138 mmol/L (ref 135–145)
Total Bilirubin: 0.8 mg/dL (ref 0.3–1.2)
Total Protein: 7.6 g/dL (ref 6.5–8.1)

## 2019-01-21 LAB — CBC
HCT: 48.2 % (ref 39.0–52.0)
Hemoglobin: 15.7 g/dL (ref 13.0–17.0)
MCH: 31.2 pg (ref 26.0–34.0)
MCHC: 32.6 g/dL (ref 30.0–36.0)
MCV: 95.6 fL (ref 80.0–100.0)
Platelets: 302 10*3/uL (ref 150–400)
RBC: 5.04 MIL/uL (ref 4.22–5.81)
RDW: 14.7 % (ref 11.5–15.5)
WBC: 4.9 10*3/uL (ref 4.0–10.5)
nRBC: 0 % (ref 0.0–0.2)

## 2019-01-21 LAB — LIPASE, BLOOD: Lipase: 31 U/L (ref 11–51)

## 2019-01-21 LAB — CBG MONITORING, ED: Glucose-Capillary: 131 mg/dL — ABNORMAL HIGH (ref 70–99)

## 2019-01-21 MED ORDER — KETOROLAC TROMETHAMINE 15 MG/ML IJ SOLN
15.0000 mg | Freq: Once | INTRAMUSCULAR | Status: AC
  Administered 2019-01-21: 15 mg via INTRAVENOUS
  Filled 2019-01-21: qty 1

## 2019-01-21 MED ORDER — PROCHLORPERAZINE EDISYLATE 10 MG/2ML IJ SOLN
10.0000 mg | Freq: Once | INTRAMUSCULAR | Status: AC
  Administered 2019-01-21: 10 mg via INTRAVENOUS
  Filled 2019-01-21: qty 2

## 2019-01-21 MED ORDER — SODIUM CHLORIDE 0.9 % IV BOLUS
1000.0000 mL | Freq: Once | INTRAVENOUS | Status: AC
  Administered 2019-01-21: 1000 mL via INTRAVENOUS

## 2019-01-21 MED ORDER — DICYCLOMINE HCL 20 MG PO TABS: 20.0000 mg | ORAL_TABLET | Freq: Two times a day (BID) | ORAL | 0 refills | Status: DC

## 2019-01-21 MED ORDER — DIPHENHYDRAMINE HCL 50 MG/ML IJ SOLN
25.0000 mg | Freq: Once | INTRAMUSCULAR | Status: AC
  Administered 2019-01-21: 25 mg via INTRAVENOUS
  Filled 2019-01-21: qty 1

## 2019-01-21 NOTE — Discharge Instructions (Addendum)
You were evaluated in the Emergency Department and after careful evaluation, we did not find any emergent condition requiring admission or further testing in the hospital.  Your symptoms today seem to be due to a virus causing diarrhea.  You can use the prescription provided for crampy abdominal pain as needed.  Drink plenty of water at home.  Please return to the Emergency Department if you experience any worsening of your condition.  We encourage you to follow up with a primary care provider.  Thank you for allowing [place] to be a part of your care.

## 2019-01-21 NOTE — ED Provider Notes (Signed)
[facility] Emergency Department Provider Note MRN:  [id number]  Arrival date & time: 01/21/19     Chief Complaint   Diarrhea   History of Present Illness   [name] Kuhnert. is a 58 y.o. year-old male with a history of acid reflux, alcohol abuse, diabetes presenting to the ED with chief complaint of diarrhea.  Patient explains that he has been getting over a cold last week.  2 days ago began experiencing watery diarrhea.  Denies recent antibiotics, no recent travel, no blood in the stool.  The stool is watery and like yellow mucus.  Also endorsing dull frontal headache, nausea, single episode nonbloody nonbilious emesis yesterday.  Denies chest pain or shortness of breath, no abdominal pain.  Symptoms are constant, no exacerbating relieving factors.  Review of Systems  A complete 10 system review of systems was obtained and all systems are negative except as noted in the HPI and PMH.   Patient's Health History    Past Medical History:  Diagnosis Date  . Acid reflux   . Alcohol abuse    6-8 cans of beer daily; hx of incarceration for DWI  . Arthritis   . Bipolar disorder ([place])   . Bronchitis   . Chronic back pain   . Chronic neck pain   . Chronic pain   . Diabetes mellitus without complication ([place])   . Fracture of lower leg   . Gout   . Hyperlipidemia   . Hypertension   . Left arm pain    chronic  . Migraine   . Pancreatitis    March 2013  . Pseudocyst of pancreas 02/28/2012  . Substance abuse [facility])     Past Surgical History:  Procedure Laterality Date  . CIRCUMCISION  03/17/2012   Procedure: CIRCUMCISION ADULT;  Surgeon: Marissa Nestle, MD;  Location: [facility];  Service: Urology;  Laterality: N/A;  . COLONOSCOPY WITH PROPOFOL  12/24/2012   YSA:YTKZ lesion as described above status post biopsy; otherwise normal rectum/Sigmoid diverticulosis/Sigmoid polyps -- resected as described above. anal lesion, benign. colon polyp, hyperplastic  .  ESOPHAGOGASTRODUODENOSCOPY (EGD) WITH PROPOFOL  12/24/2012   SWF:UXNATFT erythema and erosions of uncertain significance status post gastric biopsy (reactive gastropathy NO h.pylori)  . NOSE SURGERY     broken nose  . TOTAL HIP ARTHROPLASTY Left 10/04/2016   Procedure: LEFT TOTAL HIP ARTHROPLASTY ANTERIOR APPROACH;  Surgeon: Mcarthur Rossetti, MD;  Location: [facility];  Service: Orthopedics;  Laterality: Left;    Family History  Problem Relation Age of Onset  . Diabetes Father   . Anesthesia problems Neg Hx   . Hypotension Neg Hx   . Malignant hyperthermia Neg Hx   . Pseudochol deficiency Neg Hx   . Colon cancer Neg Hx   . Heart disease Neg Hx   . Stroke Neg Hx   . Cancer Neg Hx     Social History   Socioeconomic History  . Marital status: Single    Spouse name: Not on file  . Number of children: Not on file  . Years of education: Not on file  . Highest education level: Not on file  Occupational History  . Occupation: unemployed  Social Needs  . Financial resource strain: Not on file  . Food insecurity:    Worry: Not on file    Inability: Not on file  . Transportation needs:    Medical: Not on file    Non-medical: Not on file  Tobacco Use  .  Smoking status: Current Every Day Smoker    Packs/day: 0.50    Years: 25.00    Pack years: 12.50    Types: Cigarettes  . Smokeless tobacco: Former Systems developer    Types: Chew  Substance and Sexual Activity  . Alcohol use: Yes    Comment: "about a 6 pack a day"  . Drug use: Not Currently    Types: Cocaine, Marijuana    Comment: last used months ago as of 04/02/18  . Sexual activity: Not Currently  Lifestyle  . Physical activity:    Days per week: Not on file    Minutes per session: Not on file  . Stress: Not on file  Relationships  . Social connections:    Talks on phone: Not on file    Gets together: Not on file    Attends religious service: Not on file    Active member of club or organization: Not on file    Attends meetings  of clubs or organizations: Not on file    Relationship status: Not on file  . Intimate partner violence:    Fear of current or ex partner: Not on file    Emotionally abused: Not on file    Physically abused: Not on file    Forced sexual activity: Not on file  Other Topics Concern  . Not on file  Social History Narrative  . Not on file     Physical Exam  Vital Signs and Nursing Notes reviewed Vitals:   01/21/19 2030 01/21/19 2045  BP: 122/84   Pulse: 81 68  Resp:    Temp:    SpO2: 100% 100%    CONSTITUTIONAL: Well-appearing, NAD NEURO:  Alert and oriented x 3, no focal deficits EYES:  eyes equal and reactive ENT/NECK:  no LAD, no JVD CARDIO: Regular rate, well-perfused, normal S1 and S2 PULM:  CTAB no wheezing or rhonchi GI/GU:  normal bowel sounds, non-distended, non-tender MSK/SPINE:  No gross deformities, no edema SKIN:  no rash, atraumatic PSYCH:  Appropriate speech and behavior  Diagnostic and Interventional Summary    Labs Reviewed  COMPREHENSIVE METABOLIC PANEL - Abnormal; Notable for the following components:      Result Value   Glucose, Bld 102 (*)    AST 47 (*)    Alkaline Phosphatase 30 (*)    All other components within normal limits  URINALYSIS, ROUTINE W REFLEX MICROSCOPIC - Abnormal; Notable for the following components:   APPearance HAZY (*)    Hgb urine dipstick SMALL (*)    Protein, ur 30 (*)    All other components within normal limits  CBG MONITORING, ED - Abnormal; Notable for the following components:   Glucose-Capillary 131 (*)    All other components within normal limits  LIPASE, BLOOD  CBC    No orders to display    Medications  sodium chloride 0.9 % bolus 1,000 mL (1,000 mLs Intravenous New Bag/Given 01/21/19 2004)  sodium chloride 0.9 % bolus 1,000 mL (0 mLs Intravenous Stopped 01/21/19 2003)  diphenhydrAMINE (BENADRYL) injection 25 mg (25 mg Intravenous Given 01/21/19 1848)  prochlorperazine (COMPAZINE) injection 10 mg (10 mg  Intravenous Given 01/21/19 1849)  ketorolac (TORADOL) 15 MG/ML injection 15 mg (15 mg Intravenous Given 01/21/19 1850)     Procedures Critical Care  ED Course and Medical Decision Making  I have reviewed the triage vital signs and the nursing notes.  Pertinent labs & imaging results that were available during my care of the patient were reviewed  by me and considered in my medical decision making (see below for details).  Consistent with viral diarrhea, will provide fluids, symptomatic treatment, screening labs to evaluate for AKI, metabolic disarray related to diabetes.  Labs reassuring, patient feeling better, appropriate for discharge.  After the discussed management above, the patient was determined to be safe for discharge.  The patient was in agreement with this plan and all questions regarding their care were answered.  ED return precautions were discussed and the patient will return to the ED with any significant worsening of condition.  Barth Kirks. Sedonia Small, [place] [email or web address]  Final Clinical Impressions(s) / ED Diagnoses     ICD-10-CM   1. Diarrhea of presumed infectious origin R19.7     ED Discharge Orders         Ordered    dicyclomine (BENTYL) 20 MG tablet  2 times daily     01/21/19 2118             Maudie Flakes, MD 01/21/19 2121

## 2019-01-21 NOTE — ED Triage Notes (Signed)
Onset 2 days ago, nausea, diarrhea, headache, sweats.

## 2019-03-16 DIAGNOSIS — F1990 Other psychoactive substance use, unspecified, uncomplicated: Secondary | ICD-10-CM | POA: Insufficient documentation

## 2019-03-16 DIAGNOSIS — I10 Essential (primary) hypertension: Secondary | ICD-10-CM | POA: Diagnosis not present

## 2019-03-16 DIAGNOSIS — K219 Gastro-esophageal reflux disease without esophagitis: Secondary | ICD-10-CM | POA: Diagnosis not present

## 2019-03-16 DIAGNOSIS — E119 Type 2 diabetes mellitus without complications: Secondary | ICD-10-CM | POA: Diagnosis not present

## 2019-03-16 DIAGNOSIS — R631 Polydipsia: Secondary | ICD-10-CM | POA: Diagnosis not present

## 2019-04-06 ENCOUNTER — Telehealth (HOSPITAL_COMMUNITY): Payer: Self-pay | Admitting: Professional

## 2019-04-07 ENCOUNTER — Telehealth (HOSPITAL_COMMUNITY): Payer: Self-pay | Admitting: Professional

## 2019-05-27 ENCOUNTER — Emergency Department (HOSPITAL_COMMUNITY)
Admission: EM | Admit: 2019-05-27 | Discharge: 2019-05-27 | Disposition: A | Discharge status: Home / Self Care | Payer: Medicare Other | Attending: Emergency Medicine | Admitting: Emergency Medicine

## 2019-05-27 ENCOUNTER — Emergency Department (HOSPITAL_COMMUNITY): Payer: Medicare Other

## 2019-05-27 ENCOUNTER — Encounter (HOSPITAL_COMMUNITY): Payer: Self-pay | Admitting: Emergency Medicine

## 2019-05-27 ENCOUNTER — Other Ambulatory Visit: Payer: Self-pay

## 2019-05-27 DIAGNOSIS — Z79899 Other long term (current) drug therapy: Secondary | ICD-10-CM | POA: Diagnosis not present

## 2019-05-27 DIAGNOSIS — Z880 Allergy status to penicillin: Secondary | ICD-10-CM | POA: Diagnosis not present

## 2019-05-27 DIAGNOSIS — Z7984 Long term (current) use of oral hypoglycemic drugs: Secondary | ICD-10-CM | POA: Diagnosis not present

## 2019-05-27 DIAGNOSIS — M25561 Pain in right knee: Secondary | ICD-10-CM | POA: Diagnosis not present

## 2019-05-27 DIAGNOSIS — F17210 Nicotine dependence, cigarettes, uncomplicated: Secondary | ICD-10-CM | POA: Insufficient documentation

## 2019-05-27 DIAGNOSIS — E119 Type 2 diabetes mellitus without complications: Secondary | ICD-10-CM | POA: Diagnosis not present

## 2019-05-27 DIAGNOSIS — I10 Essential (primary) hypertension: Secondary | ICD-10-CM | POA: Diagnosis not present

## 2019-05-27 LAB — GLUCOSE, CAPILLARY: Glucose-Capillary: 104 mg/dL — ABNORMAL HIGH (ref 70–99)

## 2019-05-27 MED ORDER — IBUPROFEN 600 MG PO TABS: 600.0000 mg | ORAL_TABLET | Freq: Four times a day (QID) | ORAL | 0 refills | Status: DC | PRN

## 2019-05-27 NOTE — ED Notes (Deleted)
Patient left without knee sleeve and discharge papers.

## 2019-05-27 NOTE — ED Notes (Signed)
Patient did not sign for discharge because he had been taken out of the system.

## 2019-05-27 NOTE — ED Notes (Signed)
Patient had left to go to the bathroom. Knee sleeve applied to the right knee and patient was given his discharge papers.

## 2019-05-27 NOTE — Discharge Instructions (Addendum)
As discussed your knee x-ray shows mild arthritis, but your exam suggest a possible cartilage injury which will not show up on an x-ray.  Continue using your Ace wrap or the knee sleeve provided to help with support.  Elevate as much as possible.  Use a heating pad for 20 minutes 3-4 times daily.  Also take the ibuprofen as prescribed, make sure you take with food.  I also recommend taking a arthritis strength dose of Tylenol 650 mg with your dose of ibuprofen.  Call Dr. Aline Brochure for further evaluation and management of your knee injury.

## 2019-05-27 NOTE — ED Triage Notes (Signed)
Patient c/o R knee pain and swelling x 2 weeks. No known injury.

## 2019-05-27 NOTE — ED Notes (Signed)
Discharge vitals 116/84 BP 80 pulse and 98 percent room air O2 sats. Respirations 16.

## 2019-05-27 NOTE — ED Provider Notes (Signed)
[facility] EMERGENCY DEPARTMENT Provider Note   CSN: [id number] Arrival date & time: 05/27/19  1455    History   Chief Complaint Chief Complaint  Patient presents with  . Knee Pain    HPI [name] [name]. is a 58 y.o. male DM, HTN, reflux,gout, etoh abuse and chronic back pain presenting with a 2 week history of right knee pain and swelling with suspected injury. He describes bending down with knee in flexion and felt a pop and sudden pain with persistent sx since the event.  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  He denies radiation of pain.  He has used ace wraps with no significant improvement.  He has seen Dr. Aline Brochure in the past for other orthopedic issues.  Not currently active pt with him.      The history is provided by the patient.    Past Medical History:  Diagnosis Date  . Acid reflux   . Alcohol abuse    6-8 cans of beer daily; hx of incarceration for DWI  . Arthritis   . Bipolar disorder ([place])   . Bronchitis   . Chronic back pain   . Chronic neck pain   . Chronic pain   . Diabetes mellitus without complication ([place])   . Fracture of lower leg   . Gout   . Hyperlipidemia   . Hypertension   . Left arm pain    chronic  . Migraine   . Pancreatitis    March 2013  . Pseudocyst of pancreas 02/28/2012  . Substance abuse [facility])     Patient Active Problem List   Diagnosis Date Noted  . History of diabetes mellitus 10/10/2016  . Avascular necrosis of hip, left ([place]) 10/04/2016  . Status post left hip replacement 10/04/2016  . Rash and nonspecific skin eruption 08/12/2013  . Cholelithiases 08/04/2013  . Subcutaneous nodule 07/21/2013  . Acute alcoholic pancreatitis 91/47/8295  . Syncope 04/11/2013  . Prolonged Q-T interval on ECG 04/11/2013  . Cocaine abuse ([place]) 03/13/2013    Class: Chronic  . At risk for adverse drug event 02/14/2013  . DDD (degenerative disc disease), lumbar 02/01/2013  . Dyspepsia 12/01/2012  . BPH (benign  prostatic hyperplasia) 10/07/2012  . Allergic rhinitis 10/03/2012  . Transaminitis 10/02/2012  . Chronic pain syndrome 10/02/2012  . Essential hypertension, benign 07/01/2012  . Tobacco user 07/01/2012  . Hepatic steatosis 03/06/2012  . ED (erectile dysfunction) 03/06/2012  . Pseudocyst of pancreas 02/28/2012  . Alcohol abuse 02/25/2012  . GERD (gastroesophageal reflux disease) 02/23/2012  . Bipolar 1 disorder ([place]) 02/23/2012  . Hypertriglyceridemia 12/05/2011    Past Surgical History:  Procedure Laterality Date  . CIRCUMCISION  03/17/2012   Procedure: CIRCUMCISION ADULT;  Surgeon: Marissa Nestle, MD;  Location: [facility];  Service: Urology;  Laterality: N/A;  . COLONOSCOPY WITH PROPOFOL  12/24/2012   AOZ:HYQM lesion as described above status post biopsy; otherwise normal rectum/Sigmoid diverticulosis/Sigmoid polyps -- resected as described above. anal lesion, benign. colon polyp, hyperplastic  . ESOPHAGOGASTRODUODENOSCOPY (EGD) WITH PROPOFOL  12/24/2012   VHQ:IONGEXB erythema and erosions of uncertain significance status post gastric biopsy (reactive gastropathy NO h.pylori)  . NOSE SURGERY     broken nose  . TOTAL HIP ARTHROPLASTY Left 10/04/2016   Procedure: LEFT TOTAL HIP ARTHROPLASTY ANTERIOR APPROACH;  Surgeon: Mcarthur Rossetti, MD;  Location: [facility] ORS;  Service: Orthopedics;  Laterality: Left;        Home Medications    Prior  to Admission medications   Medication Sig Start Date End Date Taking? Authorizing Provider  amLODipine (NORVASC) 5 MG tablet Take 1 tablet (5 mg total) by mouth daily. For high blood pressure control 07/21/13   Cicero, Modena Nunnery, MD  cyclobenzaprine (FLEXERIL) 5 MG tablet Take 1 tablet (5 mg total) by mouth 3 (three) times daily as needed for muscle spasms. 07/27/18   Evalee Jefferson, PA-C  dexamethasone (DECADRON) 4 MG tablet Take 1 tablet (4 mg total) by mouth 2 (two) times daily with a meal. 04/02/18   Lily Kocher, PA-C  dicyclomine (BENTYL) 20 MG  tablet Take 1 tablet (20 mg total) by mouth 2 (two) times daily. 01/21/19   Maudie Flakes, MD  fluticasone (FLONASE) 50 MCG/ACT nasal spray Place 1 spray into both nostrils 2 (two) times daily.    [provider]  gabapentin (NEURONTIN) 300 MG capsule TAKE 1 CAPSULE BY MOUTH THREE TIMES A DAY. 05/12/17   Carole Civil, MD  ibuprofen (ADVIL) 600 MG tablet Take 1 tablet (600 mg total) by mouth every 6 (six) hours as needed. 05/27/19   Evalee Jefferson, PA-C  lisinopril (PRINIVIL,ZESTRIL) 10 MG tablet Take 10 mg by mouth daily.  01/30/18   [provider]  loratadine-pseudoephedrine (CLARITIN-D 12 HOUR) 5-120 MG tablet Take 1 tablet by mouth 2 (two) times daily. 04/02/18   Lily Kocher, PA-C  lovastatin (MEVACOR) 40 MG tablet Take 1 tablet by mouth daily. 01/28/17   [provider]  metFORMIN (GLUCOPHAGE) 850 MG tablet Take 850 mg by mouth daily with breakfast.  01/30/18   [provider]  methocarbamol (ROBAXIN) 500 MG tablet Take 2 tablets (1,000 mg total) by mouth 4 (four) times daily as needed for muscle spasms (muscle spasm/pain). 07/18/18   Francine Graven, DO  Multiple Vitamin (MULTIVITAMIN WITH MINERALS) TABS Take 1 tablet by mouth daily.    [provider]  naproxen (NAPROSYN) 250 MG tablet Take 1 tablet (250 mg total) by mouth 2 (two) times daily as needed for mild pain or moderate pain (take with food). 07/18/18   Francine Graven, DO  omeprazole (PRILOSEC) 40 MG capsule Take 40 mg by mouth daily.  01/30/18   [provider]  predniSONE (DELTASONE) 10 MG tablet Take 6 tablets day one, 5 tablets day two, 4 tablets day three, 3 tablets day four, 2 tablets day five, then 1 tablet day six 07/27/18   Sarann Tregre, Almyra Free, PA-C  traMADol (ULTRAM) 50 MG tablet Take 50 mg by mouth every 6 (six) hours as needed for moderate pain.    [provider]    Family History Family History  Problem Relation Age of Onset  . Diabetes Father   . Anesthesia problems  Neg Hx   . Hypotension Neg Hx   . Malignant hyperthermia Neg Hx   . Pseudochol deficiency Neg Hx   . Colon cancer Neg Hx   . Heart disease Neg Hx   . Stroke Neg Hx   . Cancer Neg Hx     Social History Social History   Tobacco Use  . Smoking status: Current Every Day Smoker    Packs/day: 0.50    Years: 25.00    Pack years: 12.50    Types: Cigarettes  . Smokeless tobacco: Former Systems developer    Types: Chew  Substance Use Topics  . Alcohol use: Yes    Comment: once a week  . Drug use: Not Currently    Types: Cocaine, Marijuana    Comment: last used  months ago as of 04/02/18     Allergies   Penicillins   Review of Systems Review of Systems  Constitutional: Negative for fever.  Musculoskeletal: Positive for arthralgias and joint swelling. Negative for myalgias.  Skin: Negative for color change and wound.  Neurological: Negative for weakness and numbness.     Physical Exam Updated Vital Signs BP 128/86 (BP Location: Right Arm)   Pulse 93   Temp 98 F (36.7 C) (Temporal)   Resp 18   Ht 5\' 9"  (1.753 m)   Wt 83.9 kg   SpO2 97%   BMI 27.32 kg/m   Physical Exam Constitutional:      Appearance: He is well-developed.  HENT:     Head: Atraumatic.  Neck:     Musculoskeletal: Normal range of motion.  Cardiovascular:     Comments: Pulses equal bilaterally Musculoskeletal:        General: Swelling and tenderness present.     Right knee: He exhibits swelling. He exhibits no effusion, no ecchymosis, no deformity, no erythema, normal alignment, no LCL laxity, normal meniscus and no MCL laxity. Tenderness found.     Comments: ttp right lateral patella. No deformity. No erythema or increased warmth.  Crepitus appreciated with ROM.   Skin:    General: Skin is warm and dry.  Neurological:     Mental Status: He is alert.     Sensory: No sensory deficit.     Deep Tendon Reflexes: Reflexes normal.      ED Treatments / Results  Labs (all labs ordered are listed, but only  abnormal results are displayed) Labs Reviewed  GLUCOSE, CAPILLARY - Abnormal; Notable for the following components:      Result Value   Glucose-Capillary 104 (*)    All other components within normal limits    EKG None  Radiology Dg Knee Complete 4 Views Right  Result Date: 05/27/2019 CLINICAL DATA:  Right knee pain and swelling for 2 weeks EXAM: RIGHT KNEE - COMPLETE 4+ VIEW COMPARISON:  None. FINDINGS: No acute fracture or dislocation. No aggressive osseous lesion. Mild tricompartmental joint space narrowing with marginal osteophytes. Moderate joint effusion. Soft tissues are normal. IMPRESSION: No acute osseous injury of the right knee. Electronically Signed   By: Kathreen Devoid   On: 05/27/2019 15:53    Procedures Procedures (including critical care time)  Medications Ordered in ED Medications - No data to display   Initial Impression / Assessment and Plan / ED Course  I have reviewed the triage vital signs and the nursing notes.  Pertinent labs & imaging results that were available during my care of the patient were reviewed by me and considered in my medical decision making (see chart for details).        Suspected soft tissue/cartilage injury, possible meniscal injury.  Imaging reviewed and discussed with pt. Advised continued protection/ace or knee sleeve which was provided. Discussed elevation, role of ice/heat.  Ibuprofen and tylenol for pain relief, advised f/u with Dr. Aline Brochure for further eval/management.  Final Clinical Impressions(s) / ED Diagnoses   Final diagnoses:  Acute pain of right knee    ED Discharge Orders         Ordered    ibuprofen (ADVIL) 600 MG tablet  Every 6 hours PRN     05/27/19 1928           Evalee Jefferson, Hershal Coria 05/28/19 1111    Long, Wonda Olds, MD 05/28/19 1346

## 2019-06-01 DIAGNOSIS — Z7984 Long term (current) use of oral hypoglycemic drugs: Secondary | ICD-10-CM | POA: Diagnosis not present

## 2019-06-01 DIAGNOSIS — E1140 Type 2 diabetes mellitus with diabetic neuropathy, unspecified: Secondary | ICD-10-CM | POA: Diagnosis not present

## 2019-06-01 DIAGNOSIS — E559 Vitamin D deficiency, unspecified: Secondary | ICD-10-CM | POA: Diagnosis not present

## 2019-06-01 DIAGNOSIS — E782 Mixed hyperlipidemia: Secondary | ICD-10-CM | POA: Diagnosis not present

## 2019-06-01 DIAGNOSIS — I10 Essential (primary) hypertension: Secondary | ICD-10-CM | POA: Diagnosis not present

## 2019-06-03 ENCOUNTER — Other Ambulatory Visit: Payer: Self-pay

## 2019-06-03 ENCOUNTER — Encounter: Payer: Self-pay | Admitting: Orthopaedic Surgery

## 2019-06-03 ENCOUNTER — Ambulatory Visit (INDEPENDENT_AMBULATORY_CARE_PROVIDER_SITE_OTHER): Payer: Medicare Other | Admitting: Orthopaedic Surgery

## 2019-06-03 VITALS — BP 132/86 | HR 83 | Temp 97.4°F | Ht 69.0 in | Wt 185.0 lb

## 2019-06-03 DIAGNOSIS — M25561 Pain in right knee: Secondary | ICD-10-CM | POA: Diagnosis not present

## 2019-06-03 DIAGNOSIS — I10 Essential (primary) hypertension: Secondary | ICD-10-CM | POA: Diagnosis not present

## 2019-06-03 DIAGNOSIS — E782 Mixed hyperlipidemia: Secondary | ICD-10-CM | POA: Diagnosis not present

## 2019-06-03 DIAGNOSIS — E1140 Type 2 diabetes mellitus with diabetic neuropathy, unspecified: Secondary | ICD-10-CM | POA: Diagnosis not present

## 2019-06-03 DIAGNOSIS — F17210 Nicotine dependence, cigarettes, uncomplicated: Secondary | ICD-10-CM | POA: Diagnosis not present

## 2019-06-03 DIAGNOSIS — Z20822 Contact with and (suspected) exposure to covid-19: Secondary | ICD-10-CM

## 2019-06-03 DIAGNOSIS — E559 Vitamin D deficiency, unspecified: Secondary | ICD-10-CM | POA: Diagnosis not present

## 2019-06-03 NOTE — Patient Instructions (Signed)
Steps to Quit Smoking    Smoking tobacco can be bad for your health. It can also affect almost every organ in your body. Smoking puts you and people around you at risk for many serious long-lasting (chronic) diseases. Quitting smoking is hard, but it is one of the best things that you can do for your health. It is never too late to quit.  What are the benefits of quitting smoking?  When you quit smoking, you lower your risk for getting serious diseases and conditions. They can include:  · Lung cancer or lung disease.  · Heart disease.  · Stroke.  · Heart attack.  · Not being able to have children (infertility).  · Weak bones (osteoporosis) and broken bones (fractures).  If you have coughing, wheezing, and shortness of breath, those symptoms may get better when you quit. You may also get sick less often. If you are pregnant, quitting smoking can help to lower your chances of having a baby of low birth weight.  What can I do to help me quit smoking?  Talk with your doctor about what can help you quit smoking. Some things you can do (strategies) include:  · Quitting smoking totally, instead of slowly cutting back how much you smoke over a period of time.  · Going to in-person counseling. You are more likely to quit if you go to many counseling sessions.  · Using resources and support systems, such as:  ? Online chats with a counselor.  ? Phone quitlines.  ? Printed self-help materials.  ? Support groups or group counseling.  ? Text messaging programs.  ? Mobile phone apps or applications.  · Taking medicines. Some of these medicines may have nicotine in them. If you are pregnant or breastfeeding, do not take any medicines to quit smoking unless your doctor says it is okay. Talk with your doctor about counseling or other things that can help you.  Talk with your doctor about using more than one strategy at the same time, such as taking medicines while you are also going to in-person counseling. This can help make  quitting easier.  What things can I do to make it easier to quit?  Quitting smoking might feel very hard at first, but there is a lot that you can do to make it easier. Take these steps:  · Talk to your family and friends. Ask them to support and encourage you.  · Call phone quitlines, reach out to support groups, or work with a counselor.  · Ask people who smoke to not smoke around you.  · Avoid places that make you want (trigger) to smoke, such as:  ? Bars.  ? Parties.  ? Smoke-break areas at work.  · Spend time with people who do not smoke.  · Lower the stress in your life. Stress can make you want to smoke. Try these things to help your stress:  ? Getting regular exercise.  ? Deep-breathing exercises.  ? Yoga.  ? Meditating.  ? Doing a body scan. To do this, close your eyes, focus on one area of your body at a time from head to toe, and notice which parts of your body are tense. Try to relax the muscles in those areas.  · Download or buy apps on your mobile phone or tablet that can help you stick to your quit plan. There are many free apps, such as QuitGuide from the CDC (Centers for Disease Control and Prevention). You can find more   support from smokefree.gov and other websites.  This information is not intended to replace advice given to you by your health care provider. Make sure you discuss any questions you have with your health care provider.  Document Released: 10/05/2009 Document Revised: 08/06/2016 Document Reviewed: 04/25/2015  Elsevier Interactive Patient Education © 2019 Elsevier Inc.

## 2019-06-03 NOTE — Progress Notes (Signed)
Patient [name] [name]., male DOB:1961-08-23, 58 y.o. XNA:[id number]  Chief Complaint  Patient presents with  . Knee Pain    right     HPI  [name] [name]. is a 58 y.o. male hurt his knee three weeks ago after squatting.  He felt a pop and then had medial pain.  He has had swelling and popping and giving way of the knee since then.   He went to the ER on 05-27-2019 and was seen for this. X-rays showed some DJD but otherwise negative.  I have reviewed the notes and x-rays and report.  He continues to have pain, giving way.  Nothing seems to help it.   Body mass index is 27.32 kg/m.  ROS  Review of Systems  Musculoskeletal: Positive for arthralgias and joint swelling.  All other systems reviewed and are negative.   All other systems reviewed and are negative.  The following is a summary of the past history medically, past history surgically, known current medicines, social history and family history.  This information is gathered electronically by the computer from prior information and documentation.  I review this each visit and have found including this information at this point in the chart is beneficial and informative.    Past Medical History:  Diagnosis Date  . Acid reflux   . Alcohol abuse    6-8 cans of beer daily; hx of incarceration for DWI  . Arthritis   . Bipolar disorder ([place])   . Bronchitis   . Chronic back pain   . Chronic neck pain   . Chronic pain   . Diabetes mellitus without complication ([place])   . Fracture of lower leg   . Gout   . Hyperlipidemia   . Hypertension   . Left arm pain    chronic  . Migraine   . Pancreatitis    March 2013  . Pseudocyst of pancreas 02/28/2012  . Substance abuse [facility])     Past Surgical History:  Procedure Laterality Date  . CIRCUMCISION  03/17/2012   Procedure: CIRCUMCISION ADULT;  Surgeon: Marissa Nestle, MD;  Location: [facility];  Service: Urology;  Laterality: N/A;  . COLONOSCOPY WITH PROPOFOL  12/24/2012    RKY:HCWC lesion as described above status post biopsy; otherwise normal rectum/Sigmoid diverticulosis/Sigmoid polyps -- resected as described above. anal lesion, benign. colon polyp, hyperplastic  . ESOPHAGOGASTRODUODENOSCOPY (EGD) WITH PROPOFOL  12/24/2012   BJS:EGBTDVV erythema and erosions of uncertain significance status post gastric biopsy (reactive gastropathy NO h.pylori)  . NOSE SURGERY     broken nose  . TOTAL HIP ARTHROPLASTY Left 10/04/2016   Procedure: LEFT TOTAL HIP ARTHROPLASTY ANTERIOR APPROACH;  Surgeon: Mcarthur Rossetti, MD;  Location: [facility];  Service: Orthopedics;  Laterality: Left;    Family History  Problem Relation Age of Onset  . Diabetes Father   . Anesthesia problems Neg Hx   . Hypotension Neg Hx   . Malignant hyperthermia Neg Hx   . Pseudochol deficiency Neg Hx   . Colon cancer Neg Hx   . Heart disease Neg Hx   . Stroke Neg Hx   . Cancer Neg Hx     Social History Social History   Tobacco Use  . Smoking status: Current Every Day Smoker    Packs/day: 0.50    Years: 25.00    Pack years: 12.50    Types: Cigarettes  . Smokeless tobacco: Former Systems developer    Types: Chew  Substance Use Topics  . Alcohol use:  Yes    Comment: once a week  . Drug use: Not Currently    Types: Cocaine, Marijuana    Comment: last used months ago as of 04/02/18    Allergies  Allergen Reactions  . Penicillins Itching    Has patient had a PCN reaction causing immediate rash, facial/tongue/throat swelling, SOB or lightheadedness with hypotension: No Has patient had a PCN reaction causing severe rash involving mucus membranes or skin necrosis: No Has patient had a PCN reaction that required hospitalization No Has patient had a PCN reaction occurring within the last 10 years: No If all of the above answers are "NO", then may proceed with Cephalosporin use.     Current Outpatient Medications  Medication Sig Dispense Refill  . amLODipine (NORVASC) 5 MG tablet Take 1 tablet (5  mg total) by mouth daily. For high blood pressure control 30 tablet 3  . fluticasone (FLONASE) 50 MCG/ACT nasal spray Place 1 spray into both nostrils 2 (two) times daily.    Marland Kitchen gabapentin (NEURONTIN) 300 MG capsule     . ibuprofen (ADVIL) 600 MG tablet Take 1 tablet (600 mg total) by mouth every 6 (six) hours as needed. 30 tablet 0  . lisinopril (PRINIVIL,ZESTRIL) 10 MG tablet Take 10 mg by mouth daily.     Marland Kitchen loratadine-pseudoephedrine (CLARITIN-D 12 HOUR) 5-120 MG tablet Take 1 tablet by mouth 2 (two) times daily. 20 tablet 0  . lovastatin (MEVACOR) 40 MG tablet Take 1 tablet by mouth daily.    . metFORMIN (GLUCOPHAGE) 850 MG tablet Take 850 mg by mouth daily with breakfast.     . methocarbamol (ROBAXIN) 500 MG tablet Take 2 tablets (1,000 mg total) by mouth 4 (four) times daily as needed for muscle spasms (muscle spasm/pain). 25 tablet 0  . Multiple Vitamin (MULTIVITAMIN WITH MINERALS) TABS Take 1 tablet by mouth daily.    . naproxen (NAPROSYN) 250 MG tablet Take 1 tablet (250 mg total) by mouth 2 (two) times daily as needed for mild pain or moderate pain (take with food). 14 tablet 0  . omeprazole (PRILOSEC) 40 MG capsule Take 40 mg by mouth daily.     . predniSONE (DELTASONE) 10 MG tablet Take 6 tablets day one, 5 tablets day two, 4 tablets day three, 3 tablets day four, 2 tablets day five, then 1 tablet day six 21 tablet 0  . traMADol (ULTRAM) 50 MG tablet Take 50 mg by mouth every 6 (six) hours as needed for moderate pain.     No current facility-administered medications for this visit.      Physical Exam  Blood pressure 132/86, pulse 83, temperature (!) 97.4 F (36.3 C), height 5\' 9"  (1.753 m), weight 185 lb (83.9 kg).  Constitutional: overall normal hygiene, normal nutrition, well developed, normal grooming, normal body habitus. Assistive device:none  Musculoskeletal: gait and station Limp right, muscle tone and strength are normal, no tremors or atrophy is present.  .   Neurological: coordination overall normal.  Deep tendon reflex/nerve stretch intact.  Sensation normal.  Cranial nerves II-XII intact.   Skin:   Normal overall no scars, lesions, ulcers or rashes. No psoriasis.  Psychiatric: Alert and oriented x 3.  Recent memory intact, remote memory unclear.  Normal mood and affect. Well groomed.  Good eye contact.  Cardiovascular: overall no swelling, no varicosities, no edema bilaterally, normal temperatures of the legs and arms, no clubbing, cyanosis and good capillary refill.  Right knee has effusion, ROM 0 to 110, pain medially, crepitus,  positive medial [place], [place] intact.  No redness, no distal edema is present.  Lymphatic: palpation is normal.  All other systems reviewed and are negative   The patient has been educated about the nature of the problem(s) and counseled on treatment options.  The patient appeared to understand what I have discussed and is in agreement with it.  Encounter Diagnoses  Name Primary?  . Acute pain of right knee Yes  . Cigarette nicotine dependence without complication    PROCEDURE NOTE:  The patient requests injections of the right knee , verbal consent was obtained.  The right knee was prepped appropriately after time out was performed.   Sterile technique was observed and injection of 1 cc of Depo-Medrol 40 mg with several cc's of plain xylocaine. Anesthesia was provided by ethyl chloride and a 20-gauge needle was used to inject the knee area. The injection was tolerated well.  A band aid dressing was applied.  The patient was advised to apply ice later today and tomorrow to the injection sight as needed.  PLAN Call if any problems.  Precautions discussed.  Continue current medications.   Return to clinic after MRI of the knee on the right.  I have reviewed the [place] web site prior to prescribing narcotic medicine for this patient.   Electronically  Signed Sanjuana Kava, MD 6/11/202010:54 AM

## 2019-06-08 LAB — NOVEL CORONAVIRUS, NAA: SARS-CoV-2, NAA: NOT DETECTED

## 2019-06-10 ENCOUNTER — Telehealth: Payer: Self-pay | Admitting: *Deleted

## 2019-06-10 NOTE — Telephone Encounter (Signed)
Pt calling back for covid results; negative. Reviewed with pt; verbalizes understanding.

## 2019-06-10 NOTE — Telephone Encounter (Signed)
Pt called for covid 19 test results that was negative and given to pt.

## 2019-06-11 ENCOUNTER — Other Ambulatory Visit: Payer: Self-pay

## 2019-06-11 ENCOUNTER — Ambulatory Visit (HOSPITAL_COMMUNITY)
Admission: RE | Admit: 2019-06-11 | Discharge: 2019-06-11 | Disposition: A | Discharge status: Home / Self Care | Payer: Medicare Other | Source: Ambulatory Visit | Attending: Orthopaedic Surgery | Admitting: Orthopaedic Surgery

## 2019-06-11 DIAGNOSIS — M25561 Pain in right knee: Secondary | ICD-10-CM | POA: Insufficient documentation

## 2019-06-15 ENCOUNTER — Encounter: Payer: Self-pay | Admitting: Orthopaedic Surgery

## 2019-06-15 ENCOUNTER — Ambulatory Visit (INDEPENDENT_AMBULATORY_CARE_PROVIDER_SITE_OTHER): Payer: Medicare Other | Admitting: Orthopaedic Surgery

## 2019-06-15 ENCOUNTER — Other Ambulatory Visit: Payer: Self-pay

## 2019-06-15 VITALS — BP 117/79 | HR 88 | Temp 97.9°F | Ht 69.0 in | Wt 185.0 lb

## 2019-06-15 DIAGNOSIS — M25561 Pain in right knee: Secondary | ICD-10-CM

## 2019-06-15 DIAGNOSIS — E1140 Type 2 diabetes mellitus with diabetic neuropathy, unspecified: Secondary | ICD-10-CM | POA: Diagnosis not present

## 2019-06-15 DIAGNOSIS — F17210 Nicotine dependence, cigarettes, uncomplicated: Secondary | ICD-10-CM | POA: Diagnosis not present

## 2019-06-15 DIAGNOSIS — R945 Abnormal results of liver function studies: Secondary | ICD-10-CM | POA: Diagnosis not present

## 2019-06-15 DIAGNOSIS — E559 Vitamin D deficiency, unspecified: Secondary | ICD-10-CM | POA: Diagnosis not present

## 2019-06-15 DIAGNOSIS — E785 Hyperlipidemia, unspecified: Secondary | ICD-10-CM | POA: Diagnosis not present

## 2019-06-15 MED ORDER — HYDROCODONE-ACETAMINOPHEN 5-325 MG PO TABS: ORAL_TABLET | ORAL | 0 refills | Status: DC

## 2019-06-15 NOTE — Progress Notes (Signed)
Patient [name] [name]., male DOB:04-01-1961, 58 y.o. PNT:[id number]  Chief Complaint  Patient presents with  . Knee Pain    right     HPI  [name] [name]. is a 58 y.o. male who has continued pain of the right knee.  He had a MRI which showed: IMPRESSION: 1. Horizontal tear of the posterior horn of the medial meniscus. 2. Irregular tear of the midbody and anterior horn of the lateral meniscus. 3. Areas of full-thickness cartilage loss in the lateral compartment. 4. Small joint effusion with small complex Baker's cyst.  I have explained the findings to him and showed a model.  He will need arthroscopy of the knee.  I explained the procedure to him.  I will have him see Dr. Aline Brochure.  He is agreeable.   Body mass index is 27.32 kg/m.  ROS  Review of Systems  Constitutional: Positive for activity change.  Musculoskeletal: Positive for arthralgias, gait problem and joint swelling.  All other systems reviewed and are negative.   All other systems reviewed and are negative.  The following is a summary of the past history medically, past history surgically, known current medicines, social history and family history.  This information is gathered electronically by the computer from prior information and documentation.  I review this each visit and have found including this information at this point in the chart is beneficial and informative.    Past Medical History:  Diagnosis Date  . Acid reflux   . Alcohol abuse    6-8 cans of beer daily; hx of incarceration for DWI  . Arthritis   . Bipolar disorder ([place])   . Bronchitis   . Chronic back pain   . Chronic neck pain   . Chronic pain   . Diabetes mellitus without complication ([place])   . Fracture of lower leg   . Gout   . Hyperlipidemia   . Hypertension   . Left arm pain    chronic  . Migraine   . Pancreatitis    March 2013  . Pseudocyst of pancreas 02/28/2012  . Substance abuse [facility])     Past Surgical History:   Procedure Laterality Date  . CIRCUMCISION  03/17/2012   Procedure: CIRCUMCISION ADULT;  Surgeon: Marissa Nestle, MD;  Location: [facility];  Service: Urology;  Laterality: N/A;  . COLONOSCOPY WITH PROPOFOL  12/24/2012   GQQ:PYPP lesion as described above status post biopsy; otherwise normal rectum/Sigmoid diverticulosis/Sigmoid polyps -- resected as described above. anal lesion, benign. colon polyp, hyperplastic  . ESOPHAGOGASTRODUODENOSCOPY (EGD) WITH PROPOFOL  12/24/2012   JKD:[id number] erythema and erosions of uncertain significance status post gastric biopsy (reactive gastropathy NO h.pylori)  . NOSE SURGERY     broken nose  . TOTAL HIP ARTHROPLASTY Left 10/04/2016   Procedure: LEFT TOTAL HIP ARTHROPLASTY ANTERIOR APPROACH;  Surgeon: Mcarthur Rossetti, MD;  Location: [facility];  Service: Orthopedics;  Laterality: Left;    Family History  Problem Relation Age of Onset  . Diabetes Father   . Anesthesia problems Neg Hx   . Hypotension Neg Hx   . Malignant hyperthermia Neg Hx   . Pseudochol deficiency Neg Hx   . Colon cancer Neg Hx   . Heart disease Neg Hx   . Stroke Neg Hx   . Cancer Neg Hx     Social History Social History   Tobacco Use  . Smoking status: Current Every Day Smoker    Packs/day: 0.50    Years: 25.00  Pack years: 12.50    Types: Cigarettes  . Smokeless tobacco: Former Systems developer    Types: Chew  Substance Use Topics  . Alcohol use: Yes    Comment: once a week  . Drug use: Not Currently    Types: Cocaine, Marijuana    Comment: last used months ago as of 04/02/18    Allergies  Allergen Reactions  . Penicillins Itching    Has patient had a PCN reaction causing immediate rash, facial/tongue/throat swelling, SOB or lightheadedness with hypotension: No Has patient had a PCN reaction causing severe rash involving mucus membranes or skin necrosis: No Has patient had a PCN reaction that required hospitalization No Has patient had a PCN reaction occurring within the  last 10 years: No If all of the above answers are "NO", then may proceed with Cephalosporin use.     Current Outpatient Medications  Medication Sig Dispense Refill  . celecoxib (CELEBREX) 200 MG capsule Take 200 mg by mouth 2 (two) times daily.    Marland Kitchen gabapentin (NEURONTIN) 300 MG capsule     . lisinopril (PRINIVIL,ZESTRIL) 10 MG tablet Take 10 mg by mouth daily.     Marland Kitchen lovastatin (MEVACOR) 40 MG tablet Take 1 tablet by mouth daily.    . metFORMIN (GLUCOPHAGE) 850 MG tablet Take 850 mg by mouth daily with breakfast.     . Multiple Vitamin (MULTIVITAMIN WITH MINERALS) TABS Take 1 tablet by mouth daily.    Marland Kitchen omeprazole (PRILOSEC) 40 MG capsule Take 40 mg by mouth daily.     . fluticasone (FLONASE) 50 MCG/ACT nasal spray Place 1 spray into both nostrils 2 (two) times daily.    Marland Kitchen HYDROcodone-acetaminophen (NORCO/VICODIN) 5-325 MG tablet One tablet every four hours as needed for acute pain.  Limit of five days per [place]. 30 tablet 0  . traMADol (ULTRAM) 50 MG tablet Take 50 mg by mouth every 6 (six) hours as needed for moderate pain.     No current facility-administered medications for this visit.      Physical Exam  Blood pressure 117/79, pulse 88, temperature 97.9 F (36.6 C), height 5\' 9"  (1.753 m), weight 185 lb (83.9 kg).  Constitutional: overall normal hygiene, normal nutrition, well developed, normal grooming, normal body habitus. Assistive device:none  Musculoskeletal: gait and station Limp right, muscle tone and strength are normal, no tremors or atrophy is present.  .  Neurological: coordination overall normal.  Deep tendon reflex/nerve stretch intact.  Sensation normal.  Cranial nerves II-XII intact.   Skin:   Normal overall no scars, lesions, ulcers or rashes. No psoriasis.  Psychiatric: Alert and oriented x 3.  Recent memory intact, remote memory unclear.  Normal mood and affect. Well groomed.  Good eye contact.  Cardiovascular: overall no swelling, no  varicosities, no edema bilaterally, normal temperatures of the legs and arms, no clubbing, cyanosis and good capillary refill.  Lymphatic: palpation is normal.  Right knee has effusion, crepitus, medial joint line pain. ROM 0 to 110, positive medial [place], [place] intact.  All other systems reviewed and are negative   The patient has been educated about the nature of the problem(s) and counseled on treatment options.  The patient appeared to understand what I have discussed and is in agreement with it.  Encounter Diagnoses  Name Primary?  . Acute pain of right knee Yes  . Cigarette nicotine dependence without complication     PLAN Call if any problems.  Precautions discussed.  Continue current medications.   Return  to clinic see Dr. Aline Brochure for surgery evaluation.   I have reviewed the [place] web site prior to prescribing narcotic medicine for this patient.   Electronically Signed Sanjuana Kava, MD 6/23/20209:11 AM

## 2019-06-18 ENCOUNTER — Ambulatory Visit (INDEPENDENT_AMBULATORY_CARE_PROVIDER_SITE_OTHER): Payer: Medicare Other | Admitting: Orthopedic Surgery

## 2019-06-18 ENCOUNTER — Other Ambulatory Visit: Payer: Self-pay

## 2019-06-18 VITALS — BP 128/77 | HR 80 | Ht 70.0 in | Wt 188.0 lb

## 2019-06-18 DIAGNOSIS — S83242A Other tear of medial meniscus, current injury, left knee, initial encounter: Secondary | ICD-10-CM

## 2019-06-18 DIAGNOSIS — M23200 Derangement of unspecified lateral meniscus due to old tear or injury, right knee: Secondary | ICD-10-CM

## 2019-06-18 DIAGNOSIS — M1711 Unilateral primary osteoarthritis, right knee: Secondary | ICD-10-CM | POA: Diagnosis not present

## 2019-06-18 NOTE — Patient Instructions (Signed)
Meniscus Injury, Arthroscopy   Arthroscopy is a surgical procedure that involves the use of a small scope that has a camera and surgical instruments on the end (arthroscope). An arthroscope can be used to repair your meniscus injury.  LET YOUR HEALTH CARE PROVIDER KNOW ABOUT:  Any allergies you have.  All medicines you are taking, including vitamins, herbs, eyedrops, creams, and over-the-counter medicines.  Any recent colds or infections you have had or currently have.  Previous problems you or members of your family have had with the use of anesthetics.  Any blood disorders or blood clotting problems you have.  Previous surgeries you have had.  Medical conditions you have. RISKS AND COMPLICATIONS Generally, this is a safe procedure. However, as with any procedure, problems can occur. Possible problems include:  Damage to nerves or blood vessels.  Excess bleeding.  Blood clots.  Infection. BEFORE THE PROCEDURE  Do not eat or drink for 6-8 hours before the procedure.  Take medicines as directed by your surgeon. Ask your surgeon about changing or stopping your regular medicines.  You may have lab tests the morning of surgery. PROCEDURE  You will be given one of the following:   A medicine that numbs the area (local anesthesia).  A medicine that makes you go to sleep (general anesthesia).  A medicine injected into your spine that numbs your body below the waist (spinal anesthesia). Most often, several small cuts (incisions) are made in the knee. The arthroscope and instruments go into the incisions to repair the damage. The torn portion of the meniscus is removed.   AFTER THE PROCEDURE  You will be taken to the recovery area where your progress will be monitored. When you are awake, stable, and taking fluids without complications, you will be allowed to go home. This is usually the same day. A torn or stretched ligament (ligament sprain) may take 6-8 weeks to heal.   It  takes about the 4-6 WEEKS if your surgeon removed a torn meniscus.  A repaired meniscus may require 6-12 weeks of recovery time.  A torn ligament needing reconstructive surgery may take 6-12 months to heal fully.   This information is not intended to replace advice given to you by your health care provider. Make sure you discuss any questions you have with your health care provider. You have decided to proceed with operative arthroscopy of the knee. You have decided not to continue with nonoperative measures such as but not limited to oral medication, weight loss, activity modification, physical therapy, bracing, or injection.  We will perform operative arthroscopy of the knee. Some of the risks associated with arthroscopic surgery of the knee include but are not limited to Bleeding Infection Swelling Stiffness Blood clot Pain Need for knee replacement surgery    In compliance with recent [place] law in federal regulation regarding opioid use and abuse and addiction, we will taper (stop) opioid medication after 2 weeks.  If you're not comfortable with these risks and would like to continue with nonoperative treatment please let Dr. Vernon Ariel know prior to your surgery. 

## 2019-06-18 NOTE — Progress Notes (Signed)
PREOP CONSULT/REFERRAL INTRA-OFFICE FROM DR Tyrone Apple   Chief Complaint  Patient presents with  . Knee Pain    Right knee, referred by Dr. Luna Glasgow for surgery.    58 year old male with chronic right knee pain previously treated nonoperatively with injection and medication reported no significant improvement.  He presents today for preop evaluation complaining of mild to moderate constant medial lateral joint pain swelling popping clicking giving way   Review of Systems  Constitutional: Negative for fever.  HENT:       Migraine headaches  Respiratory: Negative for shortness of breath.   Cardiovascular: Negative for chest pain.  Gastrointestinal: Positive for heartburn.  Skin: Negative.   Neurological: Negative for tingling and sensory change.  Psychiatric/Behavioral: The patient is nervous/anxious.   All other systems reviewed and are negative.    Past Medical History:  Diagnosis Date  . Acid reflux   . Alcohol abuse    6-8 cans of beer daily; hx of incarceration for DWI  . Arthritis   . Bipolar disorder ([place])   . Bronchitis   . Chronic back pain   . Chronic neck pain   . Chronic pain   . Diabetes mellitus without complication ([place])   . Fracture of lower leg   . Gout   . Hyperlipidemia   . Hypertension   . Left arm pain    chronic  . Migraine   . Pancreatitis    March 2013  . Pseudocyst of pancreas 02/28/2012  . Substance abuse [facility])     Past Surgical History:  Procedure Laterality Date  . CIRCUMCISION  03/17/2012   Procedure: CIRCUMCISION ADULT;  Surgeon: Marissa Nestle, MD;  Location: [facility];  Service: Urology;  Laterality: N/A;  . COLONOSCOPY WITH PROPOFOL  12/24/2012   CHE:NIDP lesion as described above status post biopsy; otherwise normal rectum/Sigmoid diverticulosis/Sigmoid polyps -- resected as described above. anal lesion, benign. colon polyp, hyperplastic  . ESOPHAGOGASTRODUODENOSCOPY (EGD) WITH PROPOFOL  12/24/2012   OEU:[id number] erythema and erosions  of uncertain significance status post gastric biopsy (reactive gastropathy NO h.pylori)  . NOSE SURGERY     broken nose  . TOTAL HIP ARTHROPLASTY Left 10/04/2016   Procedure: LEFT TOTAL HIP ARTHROPLASTY ANTERIOR APPROACH;  Surgeon: Mcarthur Rossetti, MD;  Location: [facility];  Service: Orthopedics;  Laterality: Left;    Family History  Problem Relation Age of Onset  . Diabetes Father   . Anesthesia problems Neg Hx   . Hypotension Neg Hx   . Malignant hyperthermia Neg Hx   . Pseudochol deficiency Neg Hx   . Colon cancer Neg Hx   . Heart disease Neg Hx   . Stroke Neg Hx   . Cancer Neg Hx    Social History   Tobacco Use  . Smoking status: Current Every Day Smoker    Packs/day: 0.50    Years: 25.00    Pack years: 12.50    Types: Cigarettes  . Smokeless tobacco: Former Systems developer    Types: Chew  Substance Use Topics  . Alcohol use: Yes    Comment: once a week  . Drug use: Not Currently    Types: Cocaine, Marijuana    Comment: last used months ago as of 04/02/18    Allergies  Allergen Reactions  . Penicillins Itching    Has patient had a PCN reaction causing immediate rash, facial/tongue/throat swelling, SOB or lightheadedness with hypotension: No Has patient had a PCN reaction causing severe rash involving mucus membranes or skin necrosis:  No Has patient had a PCN reaction that required hospitalization No Has patient had a PCN reaction occurring within the last 10 years: No If all of the above answers are "NO", then may proceed with Cephalosporin use.      No outpatient medications have been marked as taking for the 06/18/19 encounter (Office Visit) with Carole Civil, MD.    BP 128/77   Pulse 80   Ht 5\' 10"  (1.778 m)   Wt 188 lb (85.3 kg)   BMI 26.98 kg/m   Physical Exam Patient's appearance is normal he has normal body habitus no significant deformity no obesity  Cardiovascular peripheral pulses are normal with no swelling or varicose veins temperature  normal no edema  Cervical lymph nodes are normal  Mood and affect are normal He is oriented to time person place Examination of sensation showed no sensory abnormalities reflexes were intact coordination was normal in his lower and upper extremities Ortho Exam  Normal range of motion in the left knee normal alignment no tenderness or swelling full range of motion was recorded knee was stable strength and muscle tone were normal there were no tremors  Right knee right knee was stiff and tender on the medial lateral joint line there was a small effusion ligaments were stable no tremors were noted muscle strength and tone were normal there was no atrophy  McMurray signs were equivocal    MEDICAL DECISION SECTION  Prior imaging studies had been performed 4 views right knee  He has normal alignment he has peaking of the tibial spines as per at the margin of the lateral tibial plateau sclerosis in the subchondral bone on the lateral side consistent with early arthritis  He also had an MRI his MRI showed chondral lesion of the lateral femoral condyle with grade 4 cartilage loss he has a lateral meniscal tear as well as a medial meniscal tear and he has some patellofemoral arthritis  Imaging studies were reviewed with the patient as well as the treatment plan for arthroscopy of the right knee with medial lateral meniscectomy evaluation of the chondral lesion with expectation of 6 weeks of recovery time with residual pain from arthritis expected   Encounter Diagnoses  Name Primary?  . Other old tear of lateral meniscus of right knee Yes  . Acute medial meniscus tear of left knee, initial encounter   . Primary osteoarthritis of right knee      PLAN:   Surgical procedure planned: Right knee arthroscopy partial medial and lateral meniscectomy  The procedure has been fully reviewed with the patient; The risks and benefits of surgery have been discussed and explained and understood.  Alternative treatment has also been reviewed, questions were encouraged and answered. The postoperative plan is also been reviewed.  Nonsurgical treatment as described in the history and physical section was attempted and unsuccessful and the patient has agreed to proceed with surgical intervention to improve their situation.  No orders of the defined types were placed in this encounter.   Arther Abbott, MD 06/18/2019 12:29 PM

## 2019-06-21 NOTE — Patient Instructions (Signed)
Yerick Eggebrecht.  06/21/2019     @PREFPERIOPPHARMACY @   Your procedure is scheduled on  07/01/2019.  Report to [facility] at  1145  A.M.  Call this number if you have problems the morning of surgery:  [phone]   Remember:  Do not eat or drink after midnight.                        Take these medicines the morning of surgery with A SIP OF WATER  Celecrex(if needed), gabapentin, hydrocodone(if needed), prilosec, tramadol( if needed).    Do not wear jewelry, make-up or nail polish.  Do not wear lotions, powders, or perfumes, or deodorant.  Do not shave 48 hours prior to surgery.  Men may shave face and neck.  Do not bring valuables to the hospital.  [facility] is not responsible for any belongings or valuables.  Contacts, dentures or bridgework may not be worn into surgery.  Leave your suitcase in the car.  After surgery it may be brought to your room.  For patients admitted to the hospital, discharge time will be determined by your treatment team.  Patients discharged the day of surgery will not be allowed to drive home.   Name and phone number of your driver:   family Special instructions:  None  Please read over the following fact sheets that you were given. Anesthesia Post-op Instructions and Care and Recovery After Surgery       Arthroscopic Knee Ligament Repair, Care After This sheet gives you information about how to care for yourself after your procedure. Your health care provider may also give you more specific instructions. If you have problems or questions, contact your health care provider. What can I expect after the procedure? After the procedure, it is common to have:  Pain in your knee.  Bruising and swelling on your knee, calf, and ankle for 3-4 days.  Fatigue. Follow these instructions at home: If you have a brace or immobilizer:  Wear the brace or immobilizer as told by your health care provider. Remove it only as told by your health  care provider.  Loosen the splint or immobilizer if your toes tingle, become numb, or turn cold and blue.  Keep the brace or immobilizer clean. Bathing  Do not take baths, swim, or use a hot tub until your health care provider approves. Ask your health care provider if you can take showers.  Keep your bandage (dressing) dry until your health care provider says that it can be removed. Cover it and your brace or immobilizer with a watertight covering when you take a shower. Incision care   Follow instructions from your health care provider about how to take care of your incision. Make sure you: ? Wash your hands with soap and water before you change your bandage (dressing). If soap and water are not available, use hand sanitizer. ? Change your dressing as told by your health care provider. ? Leave stitches (sutures), skin glue, or adhesive strips in place. These skin closures may need to stay in place for 2 weeks or longer. If adhesive strip edges start to loosen and curl up, you may trim the loose edges. Do not remove adhesive strips completely unless your health care provider tells you to do that.  Check your incision area every day for signs of infection. Check for: ? More redness, swelling, or pain. ? More fluid or blood. ?  Warmth. ? Pus or a bad smell. Managing pain, stiffness, and swelling   If directed, put ice on the affected area. ? If you have a removable brace or immobilizer, remove it as told by your health care provider. ? Put ice in a plastic bag. ? Place a towel between your skin and the bag or between your brace or immobilizer and the bag. ? Leave the ice on for 20 minutes, 2-3 times a day.  Move your toes often to avoid stiffness and to lessen swelling.  Raise (elevate) the injured area above the level of your heart while you are sitting or lying down. Driving  Do not drive until your health care provider approves. If you have a brace or immobilizer on your leg,  ask your health care provider when it is safe for you to drive.  Do not drive or use heavy machinery while taking prescription pain medicine. Activity  Rest as directed. Ask your health care provider what activities are safe for you.  Do physical therapy exercises as told by your health care provider. Physical therapy will help you regain strength and motion in your knee.  Follow instructions from your health care provider about: ? When you may start motion exercises. ? When you may start riding a stationary bike and doing other low-impact activities. ? When you may start to jog and do other high-impact activities. Safety  Do not use the injured limb to support your body weight until your health care provider says that you can. Use crutches as told by your health care provider. General instructions  Do not use any products that contain nicotine or tobacco, such as cigarettes and e-cigarettes. These can delay bone healing. If you need help quitting, ask your health care provider.  To prevent or treat constipation while you are taking prescription pain medicine, your health care provider may recommend that you: ? Drink enough fluid to keep your urine clear or pale yellow. ? Take over-the-counter or prescription medicines. ? Eat foods that are high in fiber, such as fresh fruits and vegetables, whole grains, and beans. ? Limit foods that are high in fat and processed sugars, such as fried and sweet foods.  Take over-the-counter and prescription medicines only as told by your health care provider.  Keep all follow-up visits as told by your health care provider. This is important. Contact a health care provider if:  You have more redness, swelling, or pain around an incision.  You have more fluid or blood coming from an incision.  Your incision feels warm to the touch.  You have a fever.  You have pain or swelling in your knee, and it gets worse.  You have pain that does not get  better with medicine. Get help right away if:  You have trouble breathing.  You have pus or a bad smell coming from an incision.  You have numbness and tingling near the knee joint. Summary  After the procedure, it is common to have knee pain with bruising and swelling on your knee, calf, and ankle.  Icing your knee and raising your leg above the level of your heart will help control the pain and the swelling.  Do physical therapy exercises as told by your health care provider. Physical therapy will help you regain strength and motion in your knee. This information is not intended to replace advice given to you by your health care provider. Make sure you discuss any questions you have with your health care  provider. Document Released: 09/29/2013 Document Revised: 11/21/2017 Document Reviewed: 12/03/2016 Elsevier Patient Education  2020 [place] Anesthesia, Adult, Care After This sheet gives you information about how to care for yourself after your procedure. Your health care provider may also give you more specific instructions. If you have problems or questions, contact your health care provider. What can I expect after the procedure? After the procedure, the following side effects are common:  Pain or discomfort at the IV site.  Nausea.  Vomiting.  Sore throat.  Trouble concentrating.  Feeling cold or chills.  Weak or tired.  Sleepiness and fatigue.  Soreness and body aches. These side effects can affect parts of the body that were not involved in surgery. Follow these instructions at home:  For at least 24 hours after the procedure:  Have a responsible adult stay with you. It is important to have someone help care for you until you are awake and alert.  Rest as needed.  Do not: ? Participate in activities in which you could fall or become injured. ? Drive. ? Use heavy machinery. ? Drink alcohol. ? Take sleeping pills or medicines that cause  drowsiness. ? Make important decisions or sign legal documents. ? Take care of children on your own. Eating and drinking  Follow any instructions from your health care provider about eating or drinking restrictions.  When you feel hungry, start by eating small amounts of foods that are soft and easy to digest (bland), such as toast. Gradually return to your regular diet.  Drink enough fluid to keep your urine pale yellow.  If you vomit, rehydrate by drinking water, juice, or clear broth. General instructions  If you have sleep apnea, surgery and certain medicines can increase your risk for breathing problems. Follow instructions from your health care provider about wearing your sleep device: ? Anytime you are sleeping, including during daytime naps. ? While taking prescription pain medicines, sleeping medicines, or medicines that make you drowsy.  Return to your normal activities as told by your health care provider. Ask your health care provider what activities are safe for you.  Take over-the-counter and prescription medicines only as told by your health care provider.  If you smoke, do not smoke without supervision.  Keep all follow-up visits as told by your health care provider. This is important. Contact a health care provider if:  You have nausea or vomiting that does not get better with medicine.  You cannot eat or drink without vomiting.  You have pain that does not get better with medicine.  You are unable to pass urine.  You develop a skin rash.  You have a fever.  You have redness around your IV site that gets worse. Get help right away if:  You have difficulty breathing.  You have chest pain.  You have blood in your urine or stool, or you vomit blood. Summary  After the procedure, it is common to have a sore throat or nausea. It is also common to feel tired.  Have a responsible adult stay with you for the first 24 hours after general anesthesia. It is  important to have someone help care for you until you are awake and alert.  When you feel hungry, start by eating small amounts of foods that are soft and easy to digest (bland), such as toast. Gradually return to your regular diet.  Drink enough fluid to keep your urine pale yellow.  Return to your normal activities as told by  your health care provider. Ask your health care provider what activities are safe for you. This information is not intended to replace advice given to you by your health care provider. Make sure you discuss any questions you have with your health care provider. Document Released: 03/17/2001 Document Revised: 12/12/2017 Document Reviewed: 07/25/2017 Elsevier Patient Education  2020 [employer]. How to Use Chlorhexidine for Bathing Chlorhexidine gluconate (CHG) is a germ-killing (antiseptic) solution that is used to clean the skin. It can get rid of the bacteria that normally live on the skin and can keep them away for about 24 hours. To clean your skin with CHG, you may be given:  A CHG solution to use in the shower or as part of a sponge bath.  A prepackaged cloth that contains CHG. Cleaning your skin with CHG may help lower the risk for infection:  While you are staying in the intensive care unit of the hospital.  If you have a vascular access, such as a central line, to provide short-term or long-term access to your veins.  If you have a catheter to drain urine from your bladder.  If you are on a ventilator. A ventilator is a machine that helps you breathe by moving air in and out of your lungs.  After surgery. What are the risks? Risks of using CHG include:  A skin reaction.  Hearing loss, if CHG gets in your ears.  Eye injury, if CHG gets in your eyes and is not rinsed out.  The CHG product catching fire. Make sure that you avoid smoking and flames after applying CHG to your skin. Do not use CHG:  If you have a chlorhexidine allergy or have  previously reacted to chlorhexidine.  On babies younger than 76 months of age. How to use CHG solution  Use CHG only as told by your health care provider, and follow the instructions on the label.  Use the full amount of CHG as directed. Usually, this is one bottle. During a shower Follow these steps when using CHG solution during a shower (unless your health care provider gives you different instructions): 1. Start the shower. 2. Use your normal soap and shampoo to wash your face and hair. 3. Turn off the shower or move out of the shower stream. 4. Pour the CHG onto a clean washcloth. Do not use any type of brush or rough-edged sponge. 5. Starting at your neck, lather your body down to your toes. Make sure you follow these instructions: ? If you will be having surgery, pay special attention to the part of your body where you will be having surgery. Scrub this area for at least 1 minute. ? Do not use CHG on your head or face. If the solution gets into your ears or eyes, rinse them well with water. ? Avoid your genital area. ? Avoid any areas of skin that have broken skin, cuts, or scrapes. ? Scrub your back and under your arms. Make sure to wash skin folds. 6. Let the lather sit on your skin for 1-2 minutes or as long as told by your health care provider. 7. Thoroughly rinse your entire body in the shower. Make sure that all body creases and crevices are rinsed well. 8. Dry off with a clean towel. Do not put any substances on your body afterward-such as powder, lotion, or perfume-unless you are told to do so by your health care provider. Only use lotions that are recommended by the manufacturer. 9. Put on  clean clothes or pajamas. 10. If it is the night before your surgery, sleep in clean sheets.  During a sponge bath Follow these steps when using CHG solution during a sponge bath (unless your health care provider gives you different instructions): 1. Use your normal soap and shampoo to  wash your face and hair. 2. Pour the CHG onto a clean washcloth. 3. Starting at your neck, lather your body down to your toes. Make sure you follow these instructions: ? If you will be having surgery, pay special attention to the part of your body where you will be having surgery. Scrub this area for at least 1 minute. ? Do not use CHG on your head or face. If the solution gets into your ears or eyes, rinse them well with water. ? Avoid your genital area. ? Avoid any areas of skin that have broken skin, cuts, or scrapes. ? Scrub your back and under your arms. Make sure to wash skin folds. 4. Let the lather sit on your skin for 1-2 minutes or as long as told by your health care provider. 5. Using a different clean, wet washcloth, thoroughly rinse your entire body. Make sure that all body creases and crevices are rinsed well. 6. Dry off with a clean towel. Do not put any substances on your body afterward-such as powder, lotion, or perfume-unless you are told to do so by your health care provider. Only use lotions that are recommended by the manufacturer. 7. Put on clean clothes or pajamas. 8. If it is the night before your surgery, sleep in clean sheets. How to use CHG prepackaged cloths  Only use CHG cloths as told by your health care provider, and follow the instructions on the label.  Use the CHG cloth on clean, dry skin.  Do not use the CHG cloth on your head or face unless your health care provider tells you to.  When washing with the CHG cloth: ? Avoid your genital area. ? Avoid any areas of skin that have broken skin, cuts, or scrapes. Before surgery Follow these steps when using a CHG cloth to clean before surgery (unless your health care provider gives you different instructions): 1. Using the CHG cloth, vigorously scrub the part of your body where you will be having surgery. Scrub using a back-and-forth motion for 3 minutes. The area on your body should be completely wet with CHG  when you are done scrubbing. 2. Do not rinse. Discard the cloth and let the area air-dry. Do not put any substances on the area afterward, such as powder, lotion, or perfume. 3. Put on clean clothes or pajamas. 4. If it is the night before your surgery, sleep in clean sheets.  For general bathing Follow these steps when using CHG cloths for general bathing (unless your health care provider gives you different instructions). 1. Use a separate CHG cloth for each area of your body. Make sure you wash between any folds of skin and between your fingers and toes. Wash your body in the following order, switching to a new cloth after each step: ? The front of your neck, shoulders, and chest. ? Both of your arms, under your arms, and your hands. ? Your stomach and groin area, avoiding the genitals. ? Your right leg and foot. ? Your left leg and foot. ? The back of your neck, your back, and your buttocks. 2. Do not rinse. Discard the cloth and let the area air-dry. Do not put any substances on  your body afterward-such as powder, lotion, or perfume-unless you are told to do so by your health care provider. Only use lotions that are recommended by the manufacturer. 3. Put on clean clothes or pajamas. Contact a health care provider if:  Your skin gets irritated after scrubbing.  You have questions about using your solution or cloth. Get help right away if:  Your eyes become very red or swollen.  Your eyes itch badly.  Your skin itches badly and is red or swollen.  Your hearing changes.  You have trouble seeing.  You have swelling or tingling in your mouth or throat.  You have trouble breathing.  You swallow any chlorhexidine. Summary  Chlorhexidine gluconate (CHG) is a germ-killing (antiseptic) solution that is used to clean the skin. Cleaning your skin with CHG may help to lower your risk for infection.  You may be given CHG to use for bathing. It may be in a bottle or in a prepackaged  cloth to use on your skin. Carefully follow your health care provider's instructions and the instructions on the product label.  Do not use CHG if you have a chlorhexidine allergy.  Contact your health care provider if your skin gets irritated after scrubbing. This information is not intended to replace advice given to you by your health care provider. Make sure you discuss any questions you have with your health care provider. Document Released: 09/02/2012 Document Revised: 02/25/2019 Document Reviewed: 11/06/2017 Elsevier Patient Education  2020 [employer].

## 2019-06-28 ENCOUNTER — Other Ambulatory Visit: Payer: Self-pay

## 2019-06-28 ENCOUNTER — Other Ambulatory Visit (HOSPITAL_COMMUNITY)
Admission: RE | Admit: 2019-06-28 | Discharge: 2019-06-28 | Disposition: A | Discharge status: Home / Self Care | Payer: Medicare Other | Source: Ambulatory Visit | Attending: Orthopedic Surgery | Admitting: Orthopedic Surgery

## 2019-06-28 ENCOUNTER — Telehealth: Payer: Self-pay | Admitting: Radiology

## 2019-06-28 ENCOUNTER — Encounter (HOSPITAL_COMMUNITY)
Admission: RE | Admit: 2019-06-28 | Discharge: 2019-06-28 | Disposition: A | Discharge status: Home / Self Care | Payer: Medicare Other | Source: Ambulatory Visit | Attending: Orthopedic Surgery | Admitting: Orthopedic Surgery

## 2019-06-28 DIAGNOSIS — I10 Essential (primary) hypertension: Secondary | ICD-10-CM | POA: Diagnosis not present

## 2019-06-28 DIAGNOSIS — F17210 Nicotine dependence, cigarettes, uncomplicated: Secondary | ICD-10-CM | POA: Insufficient documentation

## 2019-06-28 DIAGNOSIS — E119 Type 2 diabetes mellitus without complications: Secondary | ICD-10-CM | POA: Diagnosis not present

## 2019-06-28 DIAGNOSIS — Z01812 Encounter for preprocedural laboratory examination: Secondary | ICD-10-CM | POA: Diagnosis not present

## 2019-06-28 DIAGNOSIS — Z1159 Encounter for screening for other viral diseases: Secondary | ICD-10-CM | POA: Diagnosis not present

## 2019-06-28 LAB — CBC WITH DIFFERENTIAL/PLATELET
Abs Immature Granulocytes: 0.01 10*3/uL (ref 0.00–0.07)
Basophils Absolute: 0.1 10*3/uL (ref 0.0–0.1)
Basophils Relative: 2 %
Eosinophils Absolute: 0.1 10*3/uL (ref 0.0–0.5)
Eosinophils Relative: 4 %
HCT: 46.9 % (ref 39.0–52.0)
Hemoglobin: 15.9 g/dL (ref 13.0–17.0)
Immature Granulocytes: 0 %
Lymphocytes Relative: 28 %
Lymphs Abs: 1 10*3/uL (ref 0.7–4.0)
MCH: 31.7 pg (ref 26.0–34.0)
MCHC: 33.9 g/dL (ref 30.0–36.0)
MCV: 93.4 fL (ref 80.0–100.0)
Monocytes Absolute: 0.4 10*3/uL (ref 0.1–1.0)
Monocytes Relative: 11 %
Neutro Abs: 2 10*3/uL (ref 1.7–7.7)
Neutrophils Relative %: 55 %
Platelets: 309 10*3/uL (ref 150–400)
RBC: 5.02 MIL/uL (ref 4.22–5.81)
RDW: 17.2 % — ABNORMAL HIGH (ref 11.5–15.5)
WBC: 3.6 10*3/uL — ABNORMAL LOW (ref 4.0–10.5)
nRBC: 0 % (ref 0.0–0.2)

## 2019-06-28 LAB — HEMOGLOBIN A1C
Hgb A1c MFr Bld: 5.9 % — ABNORMAL HIGH (ref 4.8–5.6)
Mean Plasma Glucose: 122.63 mg/dL

## 2019-06-28 LAB — BASIC METABOLIC PANEL
Anion gap: 11 (ref 5–15)
BUN: 10 mg/dL (ref 6–20)
CO2: 24 mmol/L (ref 22–32)
Calcium: 9.2 mg/dL (ref 8.9–10.3)
Chloride: 102 mmol/L (ref 98–111)
Creatinine, Ser: 0.83 mg/dL (ref 0.61–1.24)
GFR calc Af Amer: >60 mL/min (ref >60)
GFR calc non Af Amer: >60 mL/min (ref >60)
Glucose, Bld: 155 mg/dL — ABNORMAL HIGH (ref 70–99)
Potassium: 3.8 mmol/L (ref 3.5–5.1)
Sodium: 137 mmol/L (ref 135–145)

## 2019-06-28 LAB — RAPID URINE DRUG SCREEN, HOSP PERFORMED
Amphetamines: NOT DETECTED
Barbiturates: NOT DETECTED
Benzodiazepines: NOT DETECTED
Cocaine: POSITIVE — AB
Opiates: NOT DETECTED
Tetrahydrocannabinol: POSITIVE — AB

## 2019-06-28 LAB — GLUCOSE, CAPILLARY: Glucose-Capillary: 140 mg/dL — ABNORMAL HIGH (ref 70–99)

## 2019-06-28 NOTE — Telephone Encounter (Signed)
I called patient to cancel surgery he is positive for cocaine, he asked if he could reschedule, told him he has to be off it for at least a month, or his heart could stop during anesthesia. He has voiced understanding.

## 2019-06-29 LAB — SARS CORONAVIRUS 2 (TAT 6-24 HRS): SARS Coronavirus 2: NEGATIVE

## 2019-06-29 NOTE — Pre-Procedure Instructions (Signed)
Urine drug screen was positive for cocaine. Dr Hilaria Ota notified and case canceled. Interoffice message sent to Amy Littrell at Dr Ruthe Mannan office.

## 2019-07-01 ENCOUNTER — Ambulatory Visit (HOSPITAL_COMMUNITY): Admission: RE | Admit: 2019-07-01 | Payer: Medicare Other | Source: Home / Self Care | Admitting: Orthopedic Surgery

## 2019-07-01 ENCOUNTER — Encounter (HOSPITAL_COMMUNITY): Admission: RE | Payer: Self-pay | Source: Home / Self Care

## 2019-07-01 SURGERY — ARTHROSCOPY, KNEE, WITH MEDIAL MENISCECTOMY
Anesthesia: Choice | Laterality: Right

## 2019-07-06 ENCOUNTER — Other Ambulatory Visit: Payer: Self-pay | Admitting: Orthopedic Surgery

## 2019-07-06 ENCOUNTER — Telehealth: Payer: Self-pay | Admitting: Orthopedic Surgery

## 2019-07-06 NOTE — Telephone Encounter (Signed)
Yes, 30 days off the cocaine. I called, left message for him to call me back so we can discuss.

## 2019-07-06 NOTE — Telephone Encounter (Signed)
He states last time he used Cocaine was July 4th He wants to RS to August 6th  Put in orders again  To you FYI

## 2019-07-06 NOTE — Telephone Encounter (Signed)
30 day s is correct

## 2019-07-06 NOTE — Telephone Encounter (Signed)
Patient called to relay he received a reminder call about his appointment Friday, 07/09/19. We have cancelled the visit, due to patient's surgery being cancelled. He is asking when he may re-schedule the surgery - said thought he was told thirty days.  Please advise.

## 2019-07-07 ENCOUNTER — Other Ambulatory Visit: Payer: Self-pay | Admitting: Orthopedic Surgery

## 2019-07-07 NOTE — Telephone Encounter (Signed)
Okay 

## 2019-07-09 ENCOUNTER — Ambulatory Visit: Payer: Medicare Other | Admitting: Orthopedic Surgery

## 2019-07-12 ENCOUNTER — Telehealth: Payer: Self-pay | Admitting: Family Medicine

## 2019-07-12 NOTE — Telephone Encounter (Signed)
Patient is calling for test results. Patient was advised of negative COVID results. Patient expressed understanding.

## 2019-07-26 NOTE — Patient Instructions (Signed)
[name] [name].  07/26/2019     @PREFPERIOPPHARMACY @   Your procedure is scheduled on  07/29/2019.  Report to [facility] at  615  A.M.  Call this number if you have problems the morning of surgery:  [phone]   Remember:  Do not eat or drink after midnight.                         Take these medicines the morning of surgery with A SIP OF WATER  Amlodipine, celebreax, gabapentin, hydrocodone(if needed), omeprazole.    Do not wear jewelry, make-up or nail polish.  Do not wear lotions, powders, or perfumes, or deodorant.  Do not shave 48 hours prior to surgery.  Men may shave face and neck.  Do not bring valuables to the hospital.  [facility] is not responsible for any belongings or valuables.  Contacts, dentures or bridgework may not be worn into surgery.  Leave your suitcase in the car.  After surgery it may be brought to your room.  For patients admitted to the hospital, discharge time will be determined by your treatment team.  Patients discharged the day of surgery will not be allowed to drive home.   Name and phone number of your driver:   family Special instructions:  None  Please read over the following fact sheets that you were given. Anesthesia Post-op Instructions and Care and Recovery After Surgery       Arthroscopic Knee Ligament Repair, Care After This sheet gives you information about how to care for yourself after your procedure. Your health care provider may also give you more specific instructions. If you have problems or questions, contact your health care provider. What can I expect after the procedure? After the procedure, it is common to have:  Pain in your knee.  Bruising and swelling on your knee, calf, and ankle for 3-4 days.  Fatigue. Follow these instructions at home: If you have a brace or immobilizer:  Wear the brace or immobilizer as told by your health care provider. Remove it only as told by your health care provider.   Loosen the splint or immobilizer if your toes tingle, become numb, or turn cold and blue.  Keep the brace or immobilizer clean. Bathing  Do not take baths, swim, or use a hot tub until your health care provider approves. Ask your health care provider if you can take showers.  Keep your bandage (dressing) dry until your health care provider says that it can be removed. Cover it and your brace or immobilizer with a watertight covering when you take a shower. Incision care   Follow instructions from your health care provider about how to take care of your incision. Make sure you: ? Wash your hands with soap and water before you change your bandage (dressing). If soap and water are not available, use hand sanitizer. ? Change your dressing as told by your health care provider. ? Leave stitches (sutures), skin glue, or adhesive strips in place. These skin closures may need to stay in place for 2 weeks or longer. If adhesive strip edges start to loosen and curl up, you may trim the loose edges. Do not remove adhesive strips completely unless your health care provider tells you to do that.  Check your incision area every day for signs of infection. Check for: ? More redness, swelling, or pain. ? More fluid or blood. ? Warmth. ?  Pus or a bad smell. Managing pain, stiffness, and swelling   If directed, put ice on the affected area. ? If you have a removable brace or immobilizer, remove it as told by your health care provider. ? Put ice in a plastic bag. ? Place a towel between your skin and the bag or between your brace or immobilizer and the bag. ? Leave the ice on for 20 minutes, 2-3 times a day.  Move your toes often to avoid stiffness and to lessen swelling.  Raise (elevate) the injured area above the level of your heart while you are sitting or lying down. Driving  Do not drive until your health care provider approves. If you have a brace or immobilizer on your leg, ask your health care  provider when it is safe for you to drive.  Do not drive or use heavy machinery while taking prescription pain medicine. Activity  Rest as directed. Ask your health care provider what activities are safe for you.  Do physical therapy exercises as told by your health care provider. Physical therapy will help you regain strength and motion in your knee.  Follow instructions from your health care provider about: ? When you may start motion exercises. ? When you may start riding a stationary bike and doing other low-impact activities. ? When you may start to jog and do other high-impact activities. Safety  Do not use the injured limb to support your body weight until your health care provider says that you can. Use crutches as told by your health care provider. General instructions  Do not use any products that contain nicotine or tobacco, such as cigarettes and e-cigarettes. These can delay bone healing. If you need help quitting, ask your health care provider.  To prevent or treat constipation while you are taking prescription pain medicine, your health care provider may recommend that you: ? Drink enough fluid to keep your urine clear or pale yellow. ? Take over-the-counter or prescription medicines. ? Eat foods that are high in fiber, such as fresh fruits and vegetables, whole grains, and beans. ? Limit foods that are high in fat and processed sugars, such as fried and sweet foods.  Take over-the-counter and prescription medicines only as told by your health care provider.  Keep all follow-up visits as told by your health care provider. This is important. Contact a health care provider if:  You have more redness, swelling, or pain around an incision.  You have more fluid or blood coming from an incision.  Your incision feels warm to the touch.  You have a fever.  You have pain or swelling in your knee, and it gets worse.  You have pain that does not get better with medicine.  Get help right away if:  You have trouble breathing.  You have pus or a bad smell coming from an incision.  You have numbness and tingling near the knee joint. Summary  After the procedure, it is common to have knee pain with bruising and swelling on your knee, calf, and ankle.  Icing your knee and raising your leg above the level of your heart will help control the pain and the swelling.  Do physical therapy exercises as told by your health care provider. Physical therapy will help you regain strength and motion in your knee. This information is not intended to replace advice given to you by your health care provider. Make sure you discuss any questions you have with your health care provider. Document  Released: 09/29/2013 Document Revised: 11/21/2017 Document Reviewed: 12/03/2016 Elsevier Patient Education  2020 [place] Anesthesia, Adult, Care After This sheet gives you information about how to care for yourself after your procedure. Your health care provider may also give you more specific instructions. If you have problems or questions, contact your health care provider. What can I expect after the procedure? After the procedure, the following side effects are common:  Pain or discomfort at the IV site.  Nausea.  Vomiting.  Sore throat.  Trouble concentrating.  Feeling cold or chills.  Weak or tired.  Sleepiness and fatigue.  Soreness and body aches. These side effects can affect parts of the body that were not involved in surgery. Follow these instructions at home:  For at least 24 hours after the procedure:  Have a responsible adult stay with you. It is important to have someone help care for you until you are awake and alert.  Rest as needed.  Do not: ? Participate in activities in which you could fall or become injured. ? Drive. ? Use heavy machinery. ? Drink alcohol. ? Take sleeping pills or medicines that cause drowsiness. ? Make  important decisions or sign legal documents. ? Take care of children on your own. Eating and drinking  Follow any instructions from your health care provider about eating or drinking restrictions.  When you feel hungry, start by eating small amounts of foods that are soft and easy to digest (bland), such as toast. Gradually return to your regular diet.  Drink enough fluid to keep your urine pale yellow.  If you vomit, rehydrate by drinking water, juice, or clear broth. General instructions  If you have sleep apnea, surgery and certain medicines can increase your risk for breathing problems. Follow instructions from your health care provider about wearing your sleep device: ? Anytime you are sleeping, including during daytime naps. ? While taking prescription pain medicines, sleeping medicines, or medicines that make you drowsy.  Return to your normal activities as told by your health care provider. Ask your health care provider what activities are safe for you.  Take over-the-counter and prescription medicines only as told by your health care provider.  If you smoke, do not smoke without supervision.  Keep all follow-up visits as told by your health care provider. This is important. Contact a health care provider if:  You have nausea or vomiting that does not get better with medicine.  You cannot eat or drink without vomiting.  You have pain that does not get better with medicine.  You are unable to pass urine.  You develop a skin rash.  You have a fever.  You have redness around your IV site that gets worse. Get help right away if:  You have difficulty breathing.  You have chest pain.  You have blood in your urine or stool, or you vomit blood. Summary  After the procedure, it is common to have a sore throat or nausea. It is also common to feel tired.  Have a responsible adult stay with you for the first 24 hours after general anesthesia. It is important to have  someone help care for you until you are awake and alert.  When you feel hungry, start by eating small amounts of foods that are soft and easy to digest (bland), such as toast. Gradually return to your regular diet.  Drink enough fluid to keep your urine pale yellow.  Return to your normal activities as told by your health  care provider. Ask your health care provider what activities are safe for you. This information is not intended to replace advice given to you by your health care provider. Make sure you discuss any questions you have with your health care provider. Document Released: 03/17/2001 Document Revised: 12/12/2017 Document Reviewed: 07/25/2017 Elsevier Patient Education  2020 [employer]. How to Use Chlorhexidine for Bathing Chlorhexidine gluconate (CHG) is a germ-killing (antiseptic) solution that is used to clean the skin. It can get rid of the bacteria that normally live on the skin and can keep them away for about 24 hours. To clean your skin with CHG, you may be given:  A CHG solution to use in the shower or as part of a sponge bath.  A prepackaged cloth that contains CHG. Cleaning your skin with CHG may help lower the risk for infection:  While you are staying in the intensive care unit of the hospital.  If you have a vascular access, such as a central line, to provide short-term or long-term access to your veins.  If you have a catheter to drain urine from your bladder.  If you are on a ventilator. A ventilator is a machine that helps you breathe by moving air in and out of your lungs.  After surgery. What are the risks? Risks of using CHG include:  A skin reaction.  Hearing loss, if CHG gets in your ears.  Eye injury, if CHG gets in your eyes and is not rinsed out.  The CHG product catching fire. Make sure that you avoid smoking and flames after applying CHG to your skin. Do not use CHG:  If you have a chlorhexidine allergy or have previously reacted to  chlorhexidine.  On babies younger than 66 months of age. How to use CHG solution  Use CHG only as told by your health care provider, and follow the instructions on the label.  Use the full amount of CHG as directed. Usually, this is one bottle. During a shower Follow these steps when using CHG solution during a shower (unless your health care provider gives you different instructions): 1. Start the shower. 2. Use your normal soap and shampoo to wash your face and hair. 3. Turn off the shower or move out of the shower stream. 4. Pour the CHG onto a clean washcloth. Do not use any type of brush or rough-edged sponge. 5. Starting at your neck, lather your body down to your toes. Make sure you follow these instructions: ? If you will be having surgery, pay special attention to the part of your body where you will be having surgery. Scrub this area for at least 1 minute. ? Do not use CHG on your head or face. If the solution gets into your ears or eyes, rinse them well with water. ? Avoid your genital area. ? Avoid any areas of skin that have broken skin, cuts, or scrapes. ? Scrub your back and under your arms. Make sure to wash skin folds. 6. Let the lather sit on your skin for 1-2 minutes or as long as told by your health care provider. 7. Thoroughly rinse your entire body in the shower. Make sure that all body creases and crevices are rinsed well. 8. Dry off with a clean towel. Do not put any substances on your body afterward-such as powder, lotion, or perfume-unless you are told to do so by your health care provider. Only use lotions that are recommended by the manufacturer. 9. Put on clean clothes  or pajamas. 10. If it is the night before your surgery, sleep in clean sheets.  During a sponge bath Follow these steps when using CHG solution during a sponge bath (unless your health care provider gives you different instructions): 1. Use your normal soap and shampoo to wash your face and hair.  2. Pour the CHG onto a clean washcloth. 3. Starting at your neck, lather your body down to your toes. Make sure you follow these instructions: ? If you will be having surgery, pay special attention to the part of your body where you will be having surgery. Scrub this area for at least 1 minute. ? Do not use CHG on your head or face. If the solution gets into your ears or eyes, rinse them well with water. ? Avoid your genital area. ? Avoid any areas of skin that have broken skin, cuts, or scrapes. ? Scrub your back and under your arms. Make sure to wash skin folds. 4. Let the lather sit on your skin for 1-2 minutes or as long as told by your health care provider. 5. Using a different clean, wet washcloth, thoroughly rinse your entire body. Make sure that all body creases and crevices are rinsed well. 6. Dry off with a clean towel. Do not put any substances on your body afterward-such as powder, lotion, or perfume-unless you are told to do so by your health care provider. Only use lotions that are recommended by the manufacturer. 7. Put on clean clothes or pajamas. 8. If it is the night before your surgery, sleep in clean sheets. How to use CHG prepackaged cloths  Only use CHG cloths as told by your health care provider, and follow the instructions on the label.  Use the CHG cloth on clean, dry skin.  Do not use the CHG cloth on your head or face unless your health care provider tells you to.  When washing with the CHG cloth: ? Avoid your genital area. ? Avoid any areas of skin that have broken skin, cuts, or scrapes. Before surgery Follow these steps when using a CHG cloth to clean before surgery (unless your health care provider gives you different instructions): 1. Using the CHG cloth, vigorously scrub the part of your body where you will be having surgery. Scrub using a back-and-forth motion for 3 minutes. The area on your body should be completely wet with CHG when you are done  scrubbing. 2. Do not rinse. Discard the cloth and let the area air-dry. Do not put any substances on the area afterward, such as powder, lotion, or perfume. 3. Put on clean clothes or pajamas. 4. If it is the night before your surgery, sleep in clean sheets.  For general bathing Follow these steps when using CHG cloths for general bathing (unless your health care provider gives you different instructions). 1. Use a separate CHG cloth for each area of your body. Make sure you wash between any folds of skin and between your fingers and toes. Wash your body in the following order, switching to a new cloth after each step: ? The front of your neck, shoulders, and chest. ? Both of your arms, under your arms, and your hands. ? Your stomach and groin area, avoiding the genitals. ? Your right leg and foot. ? Your left leg and foot. ? The back of your neck, your back, and your buttocks. 2. Do not rinse. Discard the cloth and let the area air-dry. Do not put any substances on your body  afterward-such as powder, lotion, or perfume-unless you are told to do so by your health care provider. Only use lotions that are recommended by the manufacturer. 3. Put on clean clothes or pajamas. Contact a health care provider if:  Your skin gets irritated after scrubbing.  You have questions about using your solution or cloth. Get help right away if:  Your eyes become very red or swollen.  Your eyes itch badly.  Your skin itches badly and is red or swollen.  Your hearing changes.  You have trouble seeing.  You have swelling or tingling in your mouth or throat.  You have trouble breathing.  You swallow any chlorhexidine. Summary  Chlorhexidine gluconate (CHG) is a germ-killing (antiseptic) solution that is used to clean the skin. Cleaning your skin with CHG may help to lower your risk for infection.  You may be given CHG to use for bathing. It may be in a bottle or in a prepackaged cloth to use on  your skin. Carefully follow your health care provider's instructions and the instructions on the product label.  Do not use CHG if you have a chlorhexidine allergy.  Contact your health care provider if your skin gets irritated after scrubbing. This information is not intended to replace advice given to you by your health care provider. Make sure you discuss any questions you have with your health care provider. Document Released: 09/02/2012 Document Revised: 02/25/2019 Document Reviewed: 11/06/2017 Elsevier Patient Education  2020 [employer].

## 2019-07-27 ENCOUNTER — Encounter (HOSPITAL_COMMUNITY): Payer: Self-pay

## 2019-07-27 ENCOUNTER — Encounter (HOSPITAL_COMMUNITY)
Admission: RE | Admit: 2019-07-27 | Discharge: 2019-07-27 | Disposition: A | Discharge status: Home / Self Care | Payer: Medicare Other | Source: Ambulatory Visit | Attending: Orthopedic Surgery | Admitting: Orthopedic Surgery

## 2019-07-27 ENCOUNTER — Other Ambulatory Visit: Payer: Self-pay

## 2019-07-27 ENCOUNTER — Other Ambulatory Visit (HOSPITAL_COMMUNITY)
Admission: RE | Admit: 2019-07-27 | Discharge: 2019-07-27 | Disposition: A | Discharge status: Home / Self Care | Payer: Medicare Other | Source: Ambulatory Visit | Attending: Orthopedic Surgery | Admitting: Orthopedic Surgery

## 2019-07-27 DIAGNOSIS — Z20828 Contact with and (suspected) exposure to other viral communicable diseases: Secondary | ICD-10-CM | POA: Diagnosis not present

## 2019-07-27 DIAGNOSIS — K219 Gastro-esophageal reflux disease without esophagitis: Secondary | ICD-10-CM | POA: Diagnosis not present

## 2019-07-27 DIAGNOSIS — M23241 Derangement of anterior horn of lateral meniscus due to old tear or injury, right knee: Secondary | ICD-10-CM | POA: Diagnosis not present

## 2019-07-27 DIAGNOSIS — E119 Type 2 diabetes mellitus without complications: Secondary | ICD-10-CM | POA: Diagnosis not present

## 2019-07-27 DIAGNOSIS — I10 Essential (primary) hypertension: Secondary | ICD-10-CM | POA: Diagnosis not present

## 2019-07-27 DIAGNOSIS — S83241A Other tear of medial meniscus, current injury, right knee, initial encounter: Secondary | ICD-10-CM | POA: Diagnosis not present

## 2019-07-27 DIAGNOSIS — M1711 Unilateral primary osteoarthritis, right knee: Secondary | ICD-10-CM | POA: Diagnosis not present

## 2019-07-27 DIAGNOSIS — X58XXXA Exposure to other specified factors, initial encounter: Secondary | ICD-10-CM | POA: Diagnosis not present

## 2019-07-27 DIAGNOSIS — M659 Synovitis and tenosynovitis, unspecified: Secondary | ICD-10-CM | POA: Diagnosis not present

## 2019-07-27 DIAGNOSIS — F17210 Nicotine dependence, cigarettes, uncomplicated: Secondary | ICD-10-CM | POA: Diagnosis not present

## 2019-07-27 DIAGNOSIS — M25561 Pain in right knee: Secondary | ICD-10-CM | POA: Diagnosis present

## 2019-07-27 DIAGNOSIS — Z96642 Presence of left artificial hip joint: Secondary | ICD-10-CM | POA: Diagnosis not present

## 2019-07-27 LAB — CBC WITH DIFFERENTIAL/PLATELET
Abs Immature Granulocytes: 0.01 10*3/uL (ref 0.00–0.07)
Basophils Absolute: 0 10*3/uL (ref 0.0–0.1)
Basophils Relative: 1 %
Eosinophils Absolute: 0.1 10*3/uL (ref 0.0–0.5)
Eosinophils Relative: 3 %
HCT: 47.6 % (ref 39.0–52.0)
Hemoglobin: 15.8 g/dL (ref 13.0–17.0)
Immature Granulocytes: 0 %
Lymphocytes Relative: 34 %
Lymphs Abs: 1.2 10*3/uL (ref 0.7–4.0)
MCH: 31.2 pg (ref 26.0–34.0)
MCHC: 33.2 g/dL (ref 30.0–36.0)
MCV: 94.1 fL (ref 80.0–100.0)
Monocytes Absolute: 0.3 10*3/uL (ref 0.1–1.0)
Monocytes Relative: 10 %
Neutro Abs: 1.8 10*3/uL (ref 1.7–7.7)
Neutrophils Relative %: 52 %
Platelets: 300 10*3/uL (ref 150–400)
RBC: 5.06 MIL/uL (ref 4.22–5.81)
RDW: 14.8 % (ref 11.5–15.5)
WBC: 3.5 10*3/uL — ABNORMAL LOW (ref 4.0–10.5)
nRBC: 0 % (ref 0.0–0.2)

## 2019-07-27 LAB — GLUCOSE, CAPILLARY: Glucose-Capillary: 197 mg/dL — ABNORMAL HIGH (ref 70–99)

## 2019-07-27 LAB — BASIC METABOLIC PANEL
Anion gap: 13 (ref 5–15)
BUN: 18 mg/dL (ref 6–20)
CO2: 21 mmol/L — ABNORMAL LOW (ref 22–32)
Calcium: 8.9 mg/dL (ref 8.9–10.3)
Chloride: 102 mmol/L (ref 98–111)
Creatinine, Ser: 1.08 mg/dL (ref 0.61–1.24)
GFR calc Af Amer: >60 mL/min (ref >60)
GFR calc non Af Amer: >60 mL/min (ref >60)
Glucose, Bld: 183 mg/dL — ABNORMAL HIGH (ref 70–99)
Potassium: 3.4 mmol/L — ABNORMAL LOW (ref 3.5–5.1)
Sodium: 136 mmol/L (ref 135–145)

## 2019-07-27 LAB — SARS CORONAVIRUS 2 (TAT 6-24 HRS): SARS Coronavirus 2: NEGATIVE

## 2019-07-27 LAB — HEMOGLOBIN A1C
Hgb A1c MFr Bld: 6.3 % — ABNORMAL HIGH (ref 4.8–5.6)
Mean Plasma Glucose: 134.11 mg/dL

## 2019-07-28 NOTE — Pre-Procedure Instructions (Signed)
[id number] routed to PCP. 

## 2019-07-28 NOTE — H&P (Signed)
PREOP CONSULT/REFERRAL INTRA-OFFICE FROM DR Tyrone Apple     Chief Complaint  Patient presents with  . Knee Pain      Right knee, referred by Dr. Luna Glasgow for surgery.     58 year old male with chronic right knee pain previously treated nonoperatively with injection and medication reported no significant improvement.   He presents today for preop evaluation complaining of mild to moderate constant medial lateral joint pain swelling popping clicking giving way     Review of Systems  Constitutional: Negative for fever.  HENT:       Migraine headaches  Respiratory: Negative for shortness of breath.   Cardiovascular: Negative for chest pain.  Gastrointestinal: Positive for heartburn.  Skin: Negative.   Neurological: Negative for tingling and sensory change.  Psychiatric/Behavioral: The patient is nervous/anxious.   All other systems reviewed and are negative.           Past Medical History:  Diagnosis Date  . Acid reflux    . Alcohol abuse      6-8 cans of beer daily; hx of incarceration for DWI  . Arthritis    . Bipolar disorder ([place])    . Bronchitis    . Chronic back pain    . Chronic neck pain    . Chronic pain    . Diabetes mellitus without complication ([place])    . Fracture of lower leg    . Gout    . Hyperlipidemia    . Hypertension    . Left arm pain      chronic  . Migraine    . Pancreatitis      March 2013  . Pseudocyst of pancreas 02/28/2012  . Substance abuse [facility])            Past Surgical History:  Procedure Laterality Date  . CIRCUMCISION   03/17/2012    Procedure: CIRCUMCISION ADULT;  Surgeon: Marissa Nestle, MD;  Location: [facility];  Service: Urology;  Laterality: N/A;  . COLONOSCOPY WITH PROPOFOL   12/24/2012    SEG:BTDV lesion as described above status post biopsy; otherwise normal rectum/Sigmoid diverticulosis/Sigmoid polyps -- resected as described above. anal lesion, benign. colon polyp, hyperplastic  . ESOPHAGOGASTRODUODENOSCOPY (EGD) WITH  PROPOFOL   12/24/2012    VOH:YWVPXTG erythema and erosions of uncertain significance status post gastric biopsy (reactive gastropathy NO h.pylori)  . NOSE SURGERY        broken nose  . TOTAL HIP ARTHROPLASTY Left 10/04/2016    Procedure: LEFT TOTAL HIP ARTHROPLASTY ANTERIOR APPROACH;  Surgeon: Mcarthur Rossetti, MD;  Location: [facility];  Service: Orthopedics;  Laterality: Left;          Family History  Problem Relation Age of Onset  . Diabetes Father    . Anesthesia problems Neg Hx    . Hypotension Neg Hx    . Malignant hyperthermia Neg Hx    . Pseudochol deficiency Neg Hx    . Colon cancer Neg Hx    . Heart disease Neg Hx    . Stroke Neg Hx    . Cancer Neg Hx     Social History         Tobacco Use  . Smoking status: Current Every Day Smoker      Packs/day: 0.50      Years: 25.00      Pack years: 12.50      Types: Cigarettes  . Smokeless tobacco: Former Systems developer      Types: Adult nurse  Topics  . Alcohol use: Yes      Comment: once a week  . Drug use: Not Currently      Types: Cocaine, Marijuana      Comment: last used months ago as of 04/02/18          Allergies  Allergen Reactions  . Penicillins Itching      Has patient had a PCN reaction causing immediate rash, facial/tongue/throat swelling, SOB or lightheadedness with hypotension: No Has patient had a PCN reaction causing severe rash involving mucus membranes or skin necrosis: No Has patient had a PCN reaction that required hospitalization No Has patient had a PCN reaction occurring within the last 10 years: No If all of the above answers are "NO", then may proceed with Cephalosporin use.         Active Medications  No outpatient medications have been marked as taking for the 06/18/19 encounter (Office Visit) with Carole Civil, MD.       BP 128/77   Pulse 80   Ht 5\' 10"  (1.778 m)   Wt 188 lb (85.3 kg)   BMI 26.98 kg/m    Physical Exam Patient's appearance is normal he has normal body  habitus no significant deformity no obesity   Cardiovascular peripheral pulses are normal with no swelling or varicose veins temperature normal no edema   Cervical lymph nodes are normal   Mood and affect are normal He is oriented to time person place Examination of sensation showed no sensory abnormalities reflexes were intact coordination was normal in his lower and upper extremities Ortho Exam   Normal range of motion in the left knee normal alignment no tenderness or swelling full range of motion was recorded knee was stable strength and muscle tone were normal there were no tremors   Right knee right knee was stiff and tender on the medial lateral joint line there was a small effusion ligaments were stable no tremors were noted muscle strength and tone were normal there was no atrophy   McMurray signs were equivocal       MEDICAL DECISION SECTION  Prior imaging studies had been performed 4 views right knee   He has normal alignment he has peaking of the tibial spines as per at the margin of the lateral tibial plateau sclerosis in the subchondral bone on the lateral side consistent with early arthritis   He also had an MRI his MRI showed chondral lesion of the lateral femoral condyle with grade 4 cartilage loss he has a lateral meniscal tear as well as a medial meniscal tear and he has some patellofemoral arthritis   Imaging studies were reviewed with the patient as well as the treatment plan for arthroscopy of the right knee with medial lateral meniscectomy evaluation of the chondral lesion with expectation of 6 weeks of recovery time with residual pain from arthritis expected     Encounter Diagnoses  Name Primary?  . Other old tear of lateral meniscus of right knee Yes  . Acute medial meniscus tear of left knee, initial encounter    . Primary osteoarthritis of right knee         PLAN:    Surgical procedure planned: Right knee arthroscopy partial medial and lateral  meniscectomy   The procedure has been fully reviewed with the patient; The risks and benefits of surgery have been discussed and explained and understood. Alternative treatment has also been reviewed, questions were encouraged and answered. The postoperative plan is also been  reviewed.   Nonsurgical treatment as described in the history and physical section was attempted and unsuccessful and the patient has agreed to proceed with surgical intervention to improve their situation.   No orders of the defined types were placed in this encounter.  07/28/2019 Arther Abbott, MD

## 2019-07-29 ENCOUNTER — Ambulatory Visit (HOSPITAL_COMMUNITY)
Admission: RE | Admit: 2019-07-29 | Discharge: 2019-07-29 | Disposition: A | Discharge status: Home / Self Care | Payer: Medicare Other | Attending: Orthopedic Surgery | Admitting: Orthopedic Surgery

## 2019-07-29 ENCOUNTER — Ambulatory Visit (HOSPITAL_COMMUNITY): Payer: Medicare Other | Admitting: Anesthesiology

## 2019-07-29 ENCOUNTER — Encounter (HOSPITAL_COMMUNITY): Payer: Self-pay | Admitting: Anesthesiology

## 2019-07-29 ENCOUNTER — Encounter (HOSPITAL_COMMUNITY)
Admission: RE | Disposition: A | Discharge status: Home / Self Care | Payer: Self-pay | Source: Home / Self Care | Attending: Orthopedic Surgery

## 2019-07-29 DIAGNOSIS — S83281D Other tear of lateral meniscus, current injury, right knee, subsequent encounter: Secondary | ICD-10-CM

## 2019-07-29 DIAGNOSIS — S83281A Other tear of lateral meniscus, current injury, right knee, initial encounter: Secondary | ICD-10-CM | POA: Diagnosis not present

## 2019-07-29 DIAGNOSIS — I10 Essential (primary) hypertension: Secondary | ICD-10-CM | POA: Diagnosis not present

## 2019-07-29 DIAGNOSIS — S83241A Other tear of medial meniscus, current injury, right knee, initial encounter: Secondary | ICD-10-CM | POA: Diagnosis not present

## 2019-07-29 DIAGNOSIS — K219 Gastro-esophageal reflux disease without esophagitis: Secondary | ICD-10-CM | POA: Insufficient documentation

## 2019-07-29 DIAGNOSIS — M23241 Derangement of anterior horn of lateral meniscus due to old tear or injury, right knee: Secondary | ICD-10-CM | POA: Insufficient documentation

## 2019-07-29 DIAGNOSIS — S83241D Other tear of medial meniscus, current injury, right knee, subsequent encounter: Secondary | ICD-10-CM

## 2019-07-29 DIAGNOSIS — X58XXXA Exposure to other specified factors, initial encounter: Secondary | ICD-10-CM | POA: Insufficient documentation

## 2019-07-29 DIAGNOSIS — Z20828 Contact with and (suspected) exposure to other viral communicable diseases: Secondary | ICD-10-CM | POA: Insufficient documentation

## 2019-07-29 DIAGNOSIS — M1711 Unilateral primary osteoarthritis, right knee: Secondary | ICD-10-CM | POA: Insufficient documentation

## 2019-07-29 DIAGNOSIS — E119 Type 2 diabetes mellitus without complications: Secondary | ICD-10-CM | POA: Diagnosis not present

## 2019-07-29 DIAGNOSIS — Z96642 Presence of left artificial hip joint: Secondary | ICD-10-CM | POA: Diagnosis not present

## 2019-07-29 DIAGNOSIS — M659 Synovitis and tenosynovitis, unspecified: Secondary | ICD-10-CM | POA: Insufficient documentation

## 2019-07-29 DIAGNOSIS — F17210 Nicotine dependence, cigarettes, uncomplicated: Secondary | ICD-10-CM | POA: Insufficient documentation

## 2019-07-29 HISTORY — PX: KNEE ARTHROSCOPY WITH MEDIAL MENISECTOMY: SHX5651

## 2019-07-29 LAB — RAPID URINE DRUG SCREEN, HOSP PERFORMED
Amphetamines: NOT DETECTED
Barbiturates: NOT DETECTED
Benzodiazepines: NOT DETECTED
Cocaine: NOT DETECTED
Opiates: NOT DETECTED
Tetrahydrocannabinol: NOT DETECTED

## 2019-07-29 LAB — GLUCOSE, CAPILLARY
Glucose-Capillary: 89 mg/dL (ref 70–99)
Glucose-Capillary: 105 mg/dL — ABNORMAL HIGH (ref 70–99)

## 2019-07-29 SURGERY — ARTHROSCOPY, KNEE, WITH MEDIAL MENISCECTOMY
Anesthesia: General | Laterality: Right

## 2019-07-29 MED ORDER — DEXAMETHASONE SODIUM PHOSPHATE 10 MG/ML IJ SOLN
INTRAMUSCULAR | Status: AC
  Filled 2019-07-29: qty 1

## 2019-07-29 MED ORDER — SODIUM CHLORIDE 0.9 % IR SOLN
Status: DC | PRN
  Administered 2019-07-29 (×3): 3000 mL

## 2019-07-29 MED ORDER — FENTANYL CITRATE (PF) 250 MCG/5ML IJ SOLN
INTRAMUSCULAR | Status: AC
  Filled 2019-07-29: qty 5

## 2019-07-29 MED ORDER — CHLORHEXIDINE GLUCONATE 4 % EX LIQD: 60.0000 mL | Freq: Once | CUTANEOUS | Status: DC

## 2019-07-29 MED ORDER — BUPIVACAINE-EPINEPHRINE (PF) 0.5% -1:200000 IJ SOLN
INTRAMUSCULAR | Status: DC | PRN
  Administered 2019-07-29 (×2): 30 mL

## 2019-07-29 MED ORDER — EPINEPHRINE PF 1 MG/ML IJ SOLN
INTRAMUSCULAR | Status: AC
  Filled 2019-07-29: qty 8

## 2019-07-29 MED ORDER — LACTATED RINGERS IV SOLN
INTRAVENOUS | Status: DC
  Administered 2019-07-29 (×2): via INTRAVENOUS

## 2019-07-29 MED ORDER — HYDROMORPHONE HCL 1 MG/ML IJ SOLN
0.5000 mg | INTRAMUSCULAR | Status: DC | PRN
  Administered 2019-07-29 (×3): 0.5 mg via INTRAVENOUS
  Filled 2019-07-29 (×3): qty 0.5

## 2019-07-29 MED ORDER — OXYCODONE HCL 5 MG PO TABS
5.0000 mg | ORAL_TABLET | Freq: Once | ORAL | Status: AC
  Administered 2019-07-29: 5 mg via ORAL
  Filled 2019-07-29: qty 1

## 2019-07-29 MED ORDER — ONDANSETRON HCL 4 MG/2ML IJ SOLN
INTRAMUSCULAR | Status: DC | PRN
  Administered 2019-07-29: 4 mg via INTRAVENOUS

## 2019-07-29 MED ORDER — BUPIVACAINE-EPINEPHRINE (PF) 0.5% -1:200000 IJ SOLN
INTRAMUSCULAR | Status: AC
  Filled 2019-07-29: qty 60

## 2019-07-29 MED ORDER — MIDAZOLAM HCL 2 MG/2ML IJ SOLN
INTRAMUSCULAR | Status: AC
  Filled 2019-07-29: qty 2

## 2019-07-29 MED ORDER — SODIUM CHLORIDE 0.9% FLUSH
INTRAVENOUS | Status: AC
  Filled 2019-07-29: qty 50

## 2019-07-29 MED ORDER — PROCHLORPERAZINE EDISYLATE 10 MG/2ML IJ SOLN
10.0000 mg | Freq: Once | INTRAMUSCULAR | Status: AC
  Administered 2019-07-29: 10 mg via INTRAVENOUS
  Filled 2019-07-29: qty 2

## 2019-07-29 MED ORDER — LIDOCAINE 2% (20 MG/ML) 5 ML SYRINGE
INTRAMUSCULAR | Status: AC
  Filled 2019-07-29: qty 5

## 2019-07-29 MED ORDER — FENTANYL CITRATE (PF) 100 MCG/2ML IJ SOLN
INTRAMUSCULAR | Status: DC | PRN
  Administered 2019-07-29 (×2): 50 ug via INTRAVENOUS

## 2019-07-29 MED ORDER — METHYLPREDNISOLONE SODIUM SUCC 40 MG IJ SOLR
40.0000 mg | Freq: Once | INTRAMUSCULAR | Status: AC
  Administered 2019-07-29: 40 mg via INTRAVENOUS
  Filled 2019-07-29: qty 1

## 2019-07-29 MED ORDER — PROPOFOL 10 MG/ML IV BOLUS
INTRAVENOUS | Status: DC | PRN
  Administered 2019-07-29: 200 mg via INTRAVENOUS

## 2019-07-29 MED ORDER — CELECOXIB 400 MG PO CAPS
400.0000 mg | ORAL_CAPSULE | Freq: Once | ORAL | Status: AC
  Administered 2019-07-29: 09:00:00 400 mg via ORAL
  Filled 2019-07-29: qty 1

## 2019-07-29 MED ORDER — ONDANSETRON HCL 4 MG/2ML IJ SOLN
INTRAMUSCULAR | Status: AC
  Filled 2019-07-29: qty 2

## 2019-07-29 MED ORDER — PROPOFOL 10 MG/ML IV BOLUS
INTRAVENOUS | Status: AC
  Filled 2019-07-29: qty 80

## 2019-07-29 MED ORDER — VANCOMYCIN HCL IN DEXTROSE 1-5 GM/200ML-% IV SOLN
1000.0000 mg | INTRAVENOUS | Status: AC
  Administered 2019-07-29: 1000 mg via INTRAVENOUS

## 2019-07-29 MED ORDER — VANCOMYCIN HCL IN DEXTROSE 1-5 GM/200ML-% IV SOLN
INTRAVENOUS | Status: AC
  Filled 2019-07-29: qty 200

## 2019-07-29 MED ORDER — MIDAZOLAM HCL 2 MG/2ML IJ SOLN
INTRAMUSCULAR | Status: DC | PRN
  Administered 2019-07-29 (×2): 1 mg via INTRAVENOUS

## 2019-07-29 MED ORDER — SEVOFLURANE IN SOLN
RESPIRATORY_TRACT | Status: AC
  Filled 2019-07-29: qty 250

## 2019-07-29 MED ORDER — LIDOCAINE HCL (CARDIAC) PF 100 MG/5ML IV SOSY
PREFILLED_SYRINGE | INTRAVENOUS | Status: DC | PRN
  Administered 2019-07-29: 100 mg via INTRAVENOUS

## 2019-07-29 MED ORDER — DEXAMETHASONE SODIUM PHOSPHATE 4 MG/ML IJ SOLN
INTRAMUSCULAR | Status: DC | PRN
  Administered 2019-07-29: 8 mg via INTRAVENOUS

## 2019-07-29 MED ORDER — GLYCOPYRROLATE PF 0.2 MG/ML IJ SOSY
PREFILLED_SYRINGE | INTRAMUSCULAR | Status: AC
  Filled 2019-07-29: qty 2

## 2019-07-29 MED ORDER — SODIUM CHLORIDE 0.9 % IR SOLN
Status: DC | PRN
  Administered 2019-07-29: 1000 mL

## 2019-07-29 MED ORDER — HYDROCODONE-ACETAMINOPHEN 10-325 MG PO TABS: 1.0000 | ORAL_TABLET | ORAL | 0 refills | Status: DC | PRN

## 2019-07-29 MED ORDER — KETAMINE HCL 50 MG/5ML IJ SOSY
PREFILLED_SYRINGE | INTRAMUSCULAR | Status: AC
  Filled 2019-07-29: qty 10

## 2019-07-29 MED ORDER — MEPERIDINE HCL 50 MG/ML IJ SOLN: 6.2500 mg | INTRAMUSCULAR | Status: DC | PRN

## 2019-07-29 SURGICAL SUPPLY — 50 items
APL PRP STRL LF DISP 70% ISPRP (MISCELLANEOUS) ×1
BANDAGE ELASTIC 6 LF NS (GAUZE/BANDAGES/DRESSINGS) ×3 IMPLANT
BLADE AGGRESSIVE PLUS 4.0 (BLADE) ×3 IMPLANT
BLADE SURG SZ11 CARB STEEL (BLADE) ×3 IMPLANT
BNDG CMPR MED 5X6 ELC HKLP NS (GAUZE/BANDAGES/DRESSINGS) ×1
CHLORAPREP W/TINT 26 (MISCELLANEOUS) ×3 IMPLANT
CLOTH BEACON ORANGE TIMEOUT ST (SAFETY) ×3 IMPLANT
COOLER CRYO IC GRAV AND TUBE (ORTHOPEDIC SUPPLIES) ×3 IMPLANT
COVER WAND RF STERILE (DRAPES) ×3 IMPLANT
CUFF CRYO KNEE18X23 MED (MISCELLANEOUS) ×2 IMPLANT
CUFF TOURN SGL QUICK 34 (TOURNIQUET CUFF) ×3
CUFF TRNQT CYL 34X4.125X (TOURNIQUET CUFF) IMPLANT
DECANTER SPIKE VIAL GLASS SM (MISCELLANEOUS) ×6 IMPLANT
GAUZE 4X4 16PLY RFD (DISPOSABLE) ×3 IMPLANT
GAUZE SPONGE 4X4 12PLY STRL (GAUZE/BANDAGES/DRESSINGS) ×3 IMPLANT
GAUZE SPONGE 4X4 16PLY XRAY LF (GAUZE/BANDAGES/DRESSINGS) ×3 IMPLANT
GAUZE XEROFORM 5X9 LF (GAUZE/BANDAGES/DRESSINGS) ×3 IMPLANT
GLOVE BIOGEL PI IND STRL 7.0 (GLOVE) ×2 IMPLANT
GLOVE BIOGEL PI INDICATOR 7.0 (GLOVE) ×4
GLOVE SKINSENSE NS SZ8.0 LF (GLOVE) ×2
GLOVE SKINSENSE STRL SZ8.0 LF (GLOVE) ×1 IMPLANT
GLOVE SS N UNI LF 8.5 STRL (GLOVE) ×3 IMPLANT
GOWN STRL REUS W/ TWL LRG LVL3 (GOWN DISPOSABLE) ×1 IMPLANT
GOWN STRL REUS W/TWL LRG LVL3 (GOWN DISPOSABLE) ×3
GOWN STRL REUS W/TWL XL LVL3 (GOWN DISPOSABLE) ×3 IMPLANT
IV NS IRRIG 3000ML ARTHROMATIC (IV SOLUTION) ×8 IMPLANT
KIT BLADEGUARD II DBL (SET/KITS/TRAYS/PACK) ×3 IMPLANT
KIT TURNOVER CYSTO (KITS) ×3 IMPLANT
MANIFOLD NEPTUNE II (INSTRUMENTS) ×3 IMPLANT
MARKER SKIN DUAL TIP RULER LAB (MISCELLANEOUS) ×3 IMPLANT
NDL HYPO 18GX1.5 BLUNT FILL (NEEDLE) ×1 IMPLANT
NDL HYPO 21X1.5 SAFETY (NEEDLE) ×1 IMPLANT
NDL SPNL 18GX3.5 QUINCKE PK (NEEDLE) ×1 IMPLANT
NEEDLE HYPO 18GX1.5 BLUNT FILL (NEEDLE) ×3 IMPLANT
NEEDLE HYPO 21X1.5 SAFETY (NEEDLE) ×3 IMPLANT
NEEDLE SPNL 18GX3.5 QUINCKE PK (NEEDLE) ×3 IMPLANT
NS IRRIG 1000ML POUR BTL (IV SOLUTION) ×3 IMPLANT
PACK ARTHRO LIMB DRAPE STRL (MISCELLANEOUS) ×3 IMPLANT
PAD ABD 5X9 TENDERSORB (GAUZE/BANDAGES/DRESSINGS) ×3 IMPLANT
PAD ARMBOARD 7.5X6 YLW CONV (MISCELLANEOUS) ×3 IMPLANT
PADDING CAST COTTON 6X4 STRL (CAST SUPPLIES) ×3 IMPLANT
PROBE BIPOLAR 50 DEGREE SUCT (MISCELLANEOUS) ×2 IMPLANT
SET ARTHROSCOPY INST (INSTRUMENTS) ×3 IMPLANT
SET BASIN LINEN APH (SET/KITS/TRAYS/PACK) ×3 IMPLANT
SUT ETHILON 3 0 FSL (SUTURE) ×3 IMPLANT
SYR 10ML LL (SYRINGE) ×3 IMPLANT
SYR 30ML LL (SYRINGE) ×3 IMPLANT
TUBE CONNECTING 12'X1/4 (SUCTIONS) ×2
TUBE CONNECTING 12X1/4 (SUCTIONS) ×4 IMPLANT
TUBING ARTHRO INFLOW-ONLY STRL (TUBING) ×3 IMPLANT

## 2019-07-29 NOTE — Anesthesia Preprocedure Evaluation (Addendum)
Anesthesia Evaluation   Patient awake    Reviewed: Allergy & Precautions, NPO status , Patient's Chart, lab work & pertinent test results  Airway Mallampati: II  TM Distance: >3 FB Neck ROM: Full    Dental  (+) Edentulous Upper, Edentulous Lower   Pulmonary Current Smoker,    Pulmonary exam normal        Cardiovascular Exercise Tolerance: Good hypertension, Pt. on medications Normal cardiovascular exam Rhythm:Regular Rate:Normal  KIMO, BANCROFT CR:[id number] 27-Jul-2019 11:33:29 [facility] ROUTINE RECORD Normal sinus rhythm Normal ECG   Neuro/Psych  Headaches, PSYCHIATRIC DISORDERS Bipolar Disorder    GI/Hepatic GERD  Medicated and Controlled,(+)     substance abuse (last use of cacaine and marijuana - 06/28/19)  cocaine use and marijuana use,   Endo/Other  diabetes, Well Controlled, Type 2  Renal/GU      Musculoskeletal  (+) Arthritis ,   Abdominal   Peds  Hematology   Anesthesia Other Findings   Reproductive/Obstetrics                            Anesthesia Physical Anesthesia Plan  ASA: III  Anesthesia Plan: General   Post-op Pain Management:    Induction: Intravenous  PONV Risk Score and Plan:   Airway Management Planned: LMA  Additional Equipment:   Intra-op Plan:   Post-operative Plan:   Informed Consent: I have reviewed the patients History and Physical, chart, labs and discussed the procedure including the risks, benefits and alternatives for the proposed anesthesia with the patient or authorized representative who has indicated his/her understanding and acceptance.       Plan Discussed with: CRNA  Anesthesia Plan Comments:         Anesthesia Quick Evaluation

## 2019-07-29 NOTE — Anesthesia Postprocedure Evaluation (Signed)
Anesthesia Post Note  Patient: [name] [name].  Procedure(s) Performed: RIGHT KNEE ARTHROSCOPY WITH MEDIAL MENISCECTOMY AND LATERAL MENISCECTOMY (Right )  Patient location during evaluation: PACU Anesthesia Type: General Level of consciousness: awake Pain management: pain level controlled Vital Signs Assessment: post-procedure vital signs reviewed and stable Respiratory status: spontaneous breathing and non-rebreather facemask Cardiovascular status: blood pressure returned to baseline Postop Assessment: no apparent nausea or vomiting Anesthetic complications: no Comments: accucheck 105     Last Vitals:  Vitals:   07/29/19 0703 07/29/19 0902  Pulse: 81   Resp: 16   Temp: 36.7 C (P) 36.8 C  SpO2: 93%     Last Pain:  Vitals:   07/29/19 0703  TempSrc: Oral  PainSc: 7                  Everette Rank

## 2019-07-29 NOTE — Interval H&P Note (Signed)
History and Physical Interval Note:  07/29/2019 7:31 AM  [name] [name].  has presented today for surgery, with the diagnosis of Torn medial meniscus and lateral meniscus right knee.  The various methods of treatment have been discussed with the patient and family. After consideration of risks, benefits and other options for treatment, the patient has consented to  Procedure(s) with comments: KNEE ARTHROSCOPY WITH MEDIAL MENISCECTOMY AND LATERAL MENISCECTOMY (Right) - needs urine drug screen at [facility] as a surgical intervention.  The patient's history has been reviewed, patient examined, no change in status, stable for surgery.  I have reviewed the patient's chart and labs.  Questions were answered to the patient's satisfaction.     Arther Abbott

## 2019-07-29 NOTE — Discharge Instructions (Signed)

## 2019-07-29 NOTE — Brief Op Note (Signed)
07/29/2019  8:56 AM  PATIENT:  [name] [name] [name].  58 y.o. male  PRE-OPERATIVE DIAGNOSIS:  Torn medial meniscus and lateral meniscus right knee  POST-OPERATIVE DIAGNOSIS:  Torn medial meniscus and lateral meniscus right knee  PROCEDURE:  Procedure(s): RIGHT KNEE ARTHROSCOPY WITH MEDIAL MENISCECTOMY AND LATERAL MENISCECTOMY (Right) -29880   SURGEON:  Surgeon(s) and Role:    [name] Civil, MD - Primary  PHYSICIAN ASSISTANT:   ASSISTANTS: none   ANESTHESIA:   General  EBL:  none   BLOOD ADMINISTERED:none  DRAINS: none   LOCAL MEDICATIONS USED:  MARCAINE     SPECIMEN:  No Specimen  DISPOSITION OF SPECIMEN:  N/A  COUNTS:  YES  TOURNIQUET:   See anesthesia record   DICTATION: .Dragon Dictation  PLAN OF [name] [name]: Discharge to home after PACU  PATIENT DISPOSITION:  PACU - hemodynamically stable.   Delay start of Pharmacological VTE agent (>24hrs) due to surgical blood loss or risk of bleeding: not applicable

## 2019-07-29 NOTE — Anesthesia Procedure Notes (Signed)
Procedure Name: LMA Insertion Date/Time: 07/29/2019 7:45 AM Performed by: Georgeanne Nim, CRNA Pre-anesthesia Checklist: Patient identified, Emergency Drugs available, Suction available, Patient being monitored and Timeout performed Patient Re-evaluated:Patient Re-evaluated prior to induction Oxygen Delivery Method: Circle system utilized Preoxygenation: Pre-oxygenation with 100% oxygen Induction Type: IV induction Ventilation: Mask ventilation without difficulty LMA: LMA inserted LMA Size: 5.0 Number of attempts: 1 Placement Confirmation: positive ETCO2,  CO2 detector and breath sounds checked- equal and bilateral Tube secured with: Tape Dental Injury: Teeth and Oropharynx as per pre-operative assessment

## 2019-07-29 NOTE — Transfer of Care (Signed)
Immediate Anesthesia Transfer of Care Note  Patient: [name] [name].  Procedure(s) Performed: RIGHT KNEE ARTHROSCOPY WITH MEDIAL MENISCECTOMY AND LATERAL MENISCECTOMY (Right )  Patient Location: PACU  Anesthesia Type:General  Level of Consciousness: awake and patient cooperative  Airway & Oxygen Therapy: Patient Spontanous Breathing and Patient connected to face mask oxygen  Post-op Assessment: Report given to RN and Post -op Vital signs reviewed and stable  Post vital signs: Reviewed and stable  Last Vitals:  Vitals Value Taken Time  BP    Temp    Pulse 87 07/29/19 0903  Resp 9 07/29/19 0903  SpO2 99 % 07/29/19 0903  Vitals shown include unvalidated device data.  Last Pain:  Vitals:   07/29/19 0703  TempSrc: Oral  PainSc: 7       Patients Stated Pain Goal: 8 (50/35/46 5681)  Complications: No apparent anesthesia complications

## 2019-07-29 NOTE — Op Note (Signed)
07/29/2019  8:56 AM  PATIENT:  [name] [name].  58 y.o. male  PRE-OPERATIVE DIAGNOSIS:  Torn medial meniscus and lateral meniscus right knee  POST-OPERATIVE DIAGNOSIS:  Torn medial meniscus and lateral meniscus right knee   FINDINGS: Torn posterior horn medial meniscus torn body anterior horn lateral meniscus grade 4 chondral changes tibial plateau grade 3 chondral changes lateral femoral condyle  The remaining structures of the knee were normal  The patient did have a fairly significant amount of synovitis I would call it mild to moderate   PROCEDURE:  Procedure(s): RIGHT KNEE ARTHROSCOPY WITH MEDIAL MENISCECTOMY AND LATERAL MENISCECTOMY (Right) -29880   SURGEON:  Surgeon(s) and Role:    Carole Civil, MD - Primary  The patient was seen in preop and the right knee was confirmed as the surgical site he was taken to the operating room after surgical site confirmation and marking.  He was given vancomycin secondary to penicillin allergy had general anesthesia.  He was placed in the supine position standard arthroscopy leg holder was placed with a padding placed on the left leg  After sterile prep and drape and timeout we commenced in the following manner  A lateral portal was established the scope was placed into the joint.  A diagnostic arthroscopy was performed and the pertinent findings are noted above  A medial portal was established with a spinal needle and dilated with a blunt trocar and a probe was placed into the joint.  The scope was then used to perform a second diagnostic arthroscopy there were no additional findings  We removed the ligamentum mucosum and a significant amount of synovial hypertrophy and inflammatory tissue in the front of the joint this improve visualization.  We started on the medial side and with valgus stress on the knee we performed a medial meniscectomy with a up curved biter remove the meniscal fragments with a shaver balanced the meniscus with  the shaver and the ArthroCare wand until a stable rim was confirmed.  We then placed the knee in the figure-of-four position and performed an anterior lateral meniscectomy extending that into the body of the meniscus where the tear was located there were grade 4 changes in this area on the tibial plateau  We used the shaver and the ArthroCare wand to contour the meniscus until a stable rim was reached.  The lateral femoral condyle grade 3 lesion was observed but no additional shaving was performed  After thorough irrigation and removal of all visible meniscal fragments and articular cartilage fragments the knee was closed with 3-0 nylon suture and injected with 60 cc of Marcaine with epinephrine   PHYSICIAN ASSISTANT:   ASSISTANTS: none   ANESTHESIA:   General  EBL:  none   BLOOD ADMINISTERED:none  DRAINS: none   LOCAL MEDICATIONS USED:  MARCAINE     SPECIMEN:  No Specimen  DISPOSITION OF SPECIMEN:  N/A  COUNTS:  YES  TOURNIQUET:   See anesthesia record   DICTATION: .Dragon Dictation  PLAN OF [name]: Discharge to home after PACU  PATIENT DISPOSITION:  PACU - hemodynamically stable.   Delay start of Pharmacological VTE agent (>24hrs) due to surgical blood loss or risk of bleeding: not applicable

## 2019-07-30 ENCOUNTER — Telehealth: Payer: Self-pay | Admitting: Orthopedic Surgery

## 2019-07-30 ENCOUNTER — Encounter (HOSPITAL_COMMUNITY): Payer: Self-pay | Admitting: Orthopedic Surgery

## 2019-07-30 NOTE — Telephone Encounter (Signed)
Patient called - difficult to hear or understand, question about incision, status/post surgery yesterday, 07/29/19. Ph [phone]. Please call

## 2019-07-30 NOTE — Telephone Encounter (Signed)
I called him, his incision bled last night, reassurance provided  He does not have post op appt, can you call him and get him in next week ?

## 2019-07-30 NOTE — Telephone Encounter (Signed)
Called back to patient at 10:47am; appointment scheduled; patient aware.

## 2019-07-30 NOTE — Telephone Encounter (Signed)
Done

## 2019-08-02 ENCOUNTER — Telehealth: Payer: Self-pay | Admitting: Orthopedic Surgery

## 2019-08-02 DIAGNOSIS — M1711 Unilateral primary osteoarthritis, right knee: Secondary | ICD-10-CM | POA: Diagnosis not present

## 2019-08-02 NOTE — Telephone Encounter (Signed)
Patient called to ask for an order to be faxed to [facility] for walker; said he cannot find the prescription he was given.

## 2019-08-02 NOTE — Telephone Encounter (Signed)
He was instructed to get the walker BEFORE the surgery. I have faxed to [employer] so he can get it now. Thanks

## 2019-08-03 DIAGNOSIS — Z9889 Other specified postprocedural states: Secondary | ICD-10-CM | POA: Insufficient documentation

## 2019-08-04 ENCOUNTER — Ambulatory Visit (INDEPENDENT_AMBULATORY_CARE_PROVIDER_SITE_OTHER): Payer: Medicare Other | Admitting: Orthopedic Surgery

## 2019-08-04 ENCOUNTER — Other Ambulatory Visit: Payer: Self-pay

## 2019-08-04 ENCOUNTER — Encounter: Payer: Self-pay | Admitting: Orthopedic Surgery

## 2019-08-04 DIAGNOSIS — G8918 Other acute postprocedural pain: Secondary | ICD-10-CM

## 2019-08-04 DIAGNOSIS — Z9889 Other specified postprocedural states: Secondary | ICD-10-CM

## 2019-08-04 MED ORDER — OXYCODONE-ACETAMINOPHEN 5-325 MG PO TABS: 1.0000 | ORAL_TABLET | ORAL | 0 refills | Status: DC | PRN

## 2019-08-04 NOTE — Progress Notes (Signed)
Chief Complaint  Patient presents with  . Routine Post Op    07/29/19 right knee scope, has drainage from lateral portal site, clear/ some yellow  . Medication Management    taking hydrodocone, asking for stronger meds     Meds ordered this encounter  Medications  . oxyCODONE-acetaminophen (PERCOCET/ROXICET) 5-325 MG tablet    Sig: Take 1 tablet by mouth every 4 (four) hours as needed for severe pain.    Dispense:  42 tablet    Refill:  0   Encounter Diagnoses  Name Primary?  . S/P right knee arthroscopy 07/29/2019   . Post-operative pain Yes   07/29/2019  8:56 AM  PATIENT:  [name] [name].  58 y.o. male  PRE-OPERATIVE DIAGNOSIS:  Torn medial meniscus and lateral meniscus right knee  POST-OPERATIVE DIAGNOSIS:  Torn medial meniscus and lateral meniscus right knee  PROCEDURE:  Procedure(s): RIGHT KNEE ARTHROSCOPY WITH MEDIAL MENISCECTOMY AND LATERAL MENISCECTOMY (Right) -29880   Drainage noted from lateral portal serosanguineous  Small knee effusion noted  Recommend dressing changes daily as needed  Medication changed  Continue knee flexion exercises and f/u 2 weeks

## 2019-08-04 NOTE — Patient Instructions (Signed)
Change dressing daily as needed

## 2019-08-09 ENCOUNTER — Encounter (HOSPITAL_COMMUNITY): Payer: Self-pay | Admitting: *Deleted

## 2019-08-09 ENCOUNTER — Encounter (HOSPITAL_COMMUNITY)
Admission: EM | Disposition: A | Discharge status: Home / Self Care | Payer: Self-pay | Source: Home / Self Care | Attending: Orthopedic Surgery

## 2019-08-09 ENCOUNTER — Inpatient Hospital Stay (HOSPITAL_COMMUNITY)
Admission: EM | Admit: 2019-08-09 | Discharge: 2019-08-11 | DRG: 857 | Disposition: A | Discharge status: Home / Self Care | Payer: Medicare Other | Attending: Orthopedic Surgery | Admitting: Orthopedic Surgery

## 2019-08-09 ENCOUNTER — Other Ambulatory Visit: Payer: Self-pay

## 2019-08-09 ENCOUNTER — Emergency Department (HOSPITAL_COMMUNITY): Payer: Medicare Other

## 2019-08-09 ENCOUNTER — Observation Stay (HOSPITAL_COMMUNITY): Payer: Medicare Other | Admitting: Anesthesiology

## 2019-08-09 DIAGNOSIS — Z880 Allergy status to penicillin: Secondary | ICD-10-CM

## 2019-08-09 DIAGNOSIS — M5136 Other intervertebral disc degeneration, lumbar region: Secondary | ICD-10-CM | POA: Diagnosis present

## 2019-08-09 DIAGNOSIS — R609 Edema, unspecified: Secondary | ICD-10-CM | POA: Diagnosis not present

## 2019-08-09 DIAGNOSIS — Z7951 Long term (current) use of inhaled steroids: Secondary | ICD-10-CM | POA: Diagnosis not present

## 2019-08-09 DIAGNOSIS — K219 Gastro-esophageal reflux disease without esophagitis: Secondary | ICD-10-CM | POA: Diagnosis present

## 2019-08-09 DIAGNOSIS — E119 Type 2 diabetes mellitus without complications: Secondary | ICD-10-CM | POA: Diagnosis not present

## 2019-08-09 DIAGNOSIS — Z7984 Long term (current) use of oral hypoglycemic drugs: Secondary | ICD-10-CM | POA: Diagnosis not present

## 2019-08-09 DIAGNOSIS — T8140XA Infection following a procedure, unspecified, initial encounter: Secondary | ICD-10-CM | POA: Diagnosis not present

## 2019-08-09 DIAGNOSIS — Z833 Family history of diabetes mellitus: Secondary | ICD-10-CM

## 2019-08-09 DIAGNOSIS — G894 Chronic pain syndrome: Secondary | ICD-10-CM | POA: Diagnosis not present

## 2019-08-09 DIAGNOSIS — E781 Pure hyperglyceridemia: Secondary | ICD-10-CM | POA: Diagnosis present

## 2019-08-09 DIAGNOSIS — I10 Essential (primary) hypertension: Secondary | ICD-10-CM | POA: Diagnosis not present

## 2019-08-09 DIAGNOSIS — Z20828 Contact with and (suspected) exposure to other viral communicable diseases: Secondary | ICD-10-CM | POA: Diagnosis not present

## 2019-08-09 DIAGNOSIS — M009 Pyogenic arthritis, unspecified: Secondary | ICD-10-CM | POA: Diagnosis not present

## 2019-08-09 DIAGNOSIS — Z96642 Presence of left artificial hip joint: Secondary | ICD-10-CM | POA: Diagnosis not present

## 2019-08-09 DIAGNOSIS — E785 Hyperlipidemia, unspecified: Secondary | ICD-10-CM | POA: Diagnosis present

## 2019-08-09 DIAGNOSIS — R52 Pain, unspecified: Secondary | ICD-10-CM | POA: Diagnosis not present

## 2019-08-09 DIAGNOSIS — R5381 Other malaise: Secondary | ICD-10-CM | POA: Diagnosis not present

## 2019-08-09 DIAGNOSIS — G43909 Migraine, unspecified, not intractable, without status migrainosus: Secondary | ICD-10-CM | POA: Diagnosis present

## 2019-08-09 DIAGNOSIS — F319 Bipolar disorder, unspecified: Secondary | ICD-10-CM | POA: Diagnosis present

## 2019-08-09 DIAGNOSIS — F17210 Nicotine dependence, cigarettes, uncomplicated: Secondary | ICD-10-CM | POA: Diagnosis not present

## 2019-08-09 DIAGNOSIS — Z03818 Encounter for observation for suspected exposure to other biological agents ruled out: Secondary | ICD-10-CM | POA: Diagnosis not present

## 2019-08-09 DIAGNOSIS — M25461 Effusion, right knee: Secondary | ICD-10-CM | POA: Diagnosis not present

## 2019-08-09 DIAGNOSIS — R61 Generalized hyperhidrosis: Secondary | ICD-10-CM | POA: Diagnosis not present

## 2019-08-09 DIAGNOSIS — T8149XA Infection following a procedure, other surgical site, initial encounter: Secondary | ICD-10-CM | POA: Diagnosis not present

## 2019-08-09 HISTORY — PX: KNEE ARTHROSCOPY: SHX127

## 2019-08-09 LAB — GRAM STAIN: Special Requests: NORMAL

## 2019-08-09 LAB — SURGICAL PCR SCREEN
MRSA, PCR: NEGATIVE
Staphylococcus aureus: NEGATIVE

## 2019-08-09 LAB — BASIC METABOLIC PANEL
Anion gap: 12 (ref 5–15)
BUN: 12 mg/dL (ref 6–20)
CO2: 21 mmol/L — ABNORMAL LOW (ref 22–32)
Calcium: 9.3 mg/dL (ref 8.9–10.3)
Chloride: 102 mmol/L (ref 98–111)
Creatinine, Ser: 0.92 mg/dL (ref 0.61–1.24)
GFR calc Af Amer: >60 mL/min (ref >60)
GFR calc non Af Amer: >60 mL/min (ref >60)
Glucose, Bld: 132 mg/dL — ABNORMAL HIGH (ref 70–99)
Potassium: 3.6 mmol/L (ref 3.5–5.1)
Sodium: 135 mmol/L (ref 135–145)

## 2019-08-09 LAB — CBC WITH DIFFERENTIAL/PLATELET
Abs Immature Granulocytes: 0.02 10*3/uL (ref 0.00–0.07)
Basophils Absolute: 0.1 10*3/uL (ref 0.0–0.1)
Basophils Relative: 1 %
Eosinophils Absolute: 0.2 10*3/uL (ref 0.0–0.5)
Eosinophils Relative: 2 %
HCT: 40.5 % (ref 39.0–52.0)
Hemoglobin: 13.5 g/dL (ref 13.0–17.0)
Immature Granulocytes: 0 %
Lymphocytes Relative: 23 %
Lymphs Abs: 1.8 10*3/uL (ref 0.7–4.0)
MCH: 31.5 pg (ref 26.0–34.0)
MCHC: 33.3 g/dL (ref 30.0–36.0)
MCV: 94.4 fL (ref 80.0–100.0)
Monocytes Absolute: 0.6 10*3/uL (ref 0.1–1.0)
Monocytes Relative: 8 %
Neutro Abs: 5.2 10*3/uL (ref 1.7–7.7)
Neutrophils Relative %: 66 %
Platelets: 337 10*3/uL (ref 150–400)
RBC: 4.29 MIL/uL (ref 4.22–5.81)
RDW: 14.5 % (ref 11.5–15.5)
WBC: 7.8 10*3/uL (ref 4.0–10.5)
nRBC: 0 % (ref 0.0–0.2)

## 2019-08-09 LAB — SYNOVIAL CELL COUNT + DIFF, W/ CRYSTALS
Crystals, Fluid: NONE SEEN
Eosinophils-Synovial: 0 % (ref 0–1)
Lymphocytes-Synovial Fld: 0 % (ref 0–20)
Monocyte-Macrophage-Synovial Fluid: 3 % — ABNORMAL LOW (ref 50–90)
Neutrophil, Synovial: 97 % — ABNORMAL HIGH (ref 0–25)
Other Cells-SYN: 0
WBC, Synovial: 71400 /mm3 — ABNORMAL HIGH (ref 0–200)

## 2019-08-09 LAB — SEDIMENTATION RATE: Sed Rate: 0 mm/hr (ref 0–16)

## 2019-08-09 LAB — CBG MONITORING, ED: Glucose-Capillary: 128 mg/dL — ABNORMAL HIGH (ref 70–99)

## 2019-08-09 LAB — ETHANOL: Alcohol, Ethyl (B): 10 mg/dL — ABNORMAL HIGH (ref <10)

## 2019-08-09 LAB — GLUCOSE, CAPILLARY: Glucose-Capillary: 110 mg/dL — ABNORMAL HIGH (ref 70–99)

## 2019-08-09 LAB — C-REACTIVE PROTEIN: CRP: 0.9 mg/dL (ref <1.0)

## 2019-08-09 LAB — SARS CORONAVIRUS 2 BY RT PCR (HOSPITAL ORDER, PERFORMED IN ~~LOC~~ HOSPITAL LAB): SARS Coronavirus 2: NEGATIVE

## 2019-08-09 SURGERY — ARTHROSCOPY, KNEE
Anesthesia: General | Site: Knee | Laterality: Right

## 2019-08-09 MED ORDER — CEFAZOLIN SODIUM-DEXTROSE 2-4 GM/100ML-% IV SOLN
INTRAVENOUS | Status: AC
  Filled 2019-08-09: qty 100

## 2019-08-09 MED ORDER — KETAMINE HCL 10 MG/ML IJ SOLN
INTRAMUSCULAR | Status: DC | PRN
  Administered 2019-08-09: 40 mg via INTRAVENOUS

## 2019-08-09 MED ORDER — CHLORHEXIDINE GLUCONATE 4 % EX LIQD
60.0000 mL | Freq: Once | CUTANEOUS | Status: AC
  Administered 2019-08-09: 4 via TOPICAL

## 2019-08-09 MED ORDER — ACETAMINOPHEN 325 MG PO TABS
325.0000 mg | ORAL_TABLET | Freq: Four times a day (QID) | ORAL | Status: DC | PRN
  Administered 2019-08-11: 650 mg via ORAL
  Filled 2019-08-09: qty 2

## 2019-08-09 MED ORDER — LACTATED RINGERS IV SOLN
INTRAVENOUS | Status: DC | PRN
  Administered 2019-08-09: 20:00:00 via INTRAVENOUS

## 2019-08-09 MED ORDER — FLUTICASONE PROPIONATE 50 MCG/ACT NA SUSP: 2.0000 | Freq: Every day | NASAL | Status: DC | PRN

## 2019-08-09 MED ORDER — OXYCODONE HCL 5 MG PO TABS: 5.0000 mg | ORAL_TABLET | Freq: Once | ORAL | Status: DC | PRN

## 2019-08-09 MED ORDER — PROPOFOL 10 MG/ML IV BOLUS
INTRAVENOUS | Status: DC | PRN
  Administered 2019-08-09: 200 mg via INTRAVENOUS

## 2019-08-09 MED ORDER — MORPHINE SULFATE (PF) 2 MG/ML IV SOLN
2.0000 mg | INTRAVENOUS | Status: DC | PRN
  Administered 2019-08-09: 18:00:00 4 mg via INTRAVENOUS
  Filled 2019-08-09: qty 2

## 2019-08-09 MED ORDER — CEFAZOLIN SODIUM-DEXTROSE 2-4 GM/100ML-% IV SOLN
2.0000 g | Freq: Four times a day (QID) | INTRAVENOUS | Status: AC
  Administered 2019-08-10 (×3): 2 g via INTRAVENOUS
  Filled 2019-08-09 (×4): qty 100

## 2019-08-09 MED ORDER — DOCUSATE SODIUM 100 MG PO CAPS
100.0000 mg | ORAL_CAPSULE | Freq: Two times a day (BID) | ORAL | Status: DC
  Administered 2019-08-09 – 2019-08-11 (×4): 100 mg via ORAL
  Filled 2019-08-09 (×4): qty 1

## 2019-08-09 MED ORDER — ACETAMINOPHEN 160 MG/5ML PO SOLN: 325.0000 mg | ORAL | Status: DC | PRN

## 2019-08-09 MED ORDER — CITALOPRAM HYDROBROMIDE 20 MG PO TABS
20.0000 mg | ORAL_TABLET | Freq: Every day | ORAL | Status: DC
  Administered 2019-08-10 – 2019-08-11 (×2): 20 mg via ORAL
  Filled 2019-08-09 (×2): qty 1

## 2019-08-09 MED ORDER — FENTANYL CITRATE (PF) 100 MCG/2ML IJ SOLN
25.0000 ug | INTRAMUSCULAR | Status: DC | PRN
  Administered 2019-08-09: 50 ug via INTRAVENOUS

## 2019-08-09 MED ORDER — LISINOPRIL 10 MG PO TABS
10.0000 mg | ORAL_TABLET | Freq: Every day | ORAL | Status: DC
  Administered 2019-08-10 – 2019-08-11 (×2): 10 mg via ORAL
  Filled 2019-08-09 (×2): qty 1

## 2019-08-09 MED ORDER — OXYCODONE HCL 5 MG PO TABS
10.0000 mg | ORAL_TABLET | ORAL | Status: DC | PRN
  Administered 2019-08-10 – 2019-08-11 (×6): 15 mg via ORAL
  Filled 2019-08-09 (×6): qty 3

## 2019-08-09 MED ORDER — MIDAZOLAM HCL 5 MG/5ML IJ SOLN
INTRAMUSCULAR | Status: DC | PRN
  Administered 2019-08-09: 2 mg via INTRAVENOUS

## 2019-08-09 MED ORDER — PROPOFOL 10 MG/ML IV BOLUS
INTRAVENOUS | Status: AC
  Filled 2019-08-09: qty 20

## 2019-08-09 MED ORDER — IBUPROFEN 400 MG PO TABS
600.0000 mg | ORAL_TABLET | Freq: Four times a day (QID) | ORAL | Status: DC | PRN
  Administered 2019-08-10 (×2): 600 mg via ORAL
  Filled 2019-08-09 (×2): qty 1

## 2019-08-09 MED ORDER — HYDROMORPHONE HCL 1 MG/ML IJ SOLN
0.5000 mg | Freq: Once | INTRAMUSCULAR | Status: DC
  Filled 2019-08-09: qty 1

## 2019-08-09 MED ORDER — ADULT MULTIVITAMIN W/MINERALS CH
1.0000 | ORAL_TABLET | Freq: Every day | ORAL | Status: DC
  Administered 2019-08-10 – 2019-08-11 (×2): 1 via ORAL
  Filled 2019-08-09 (×2): qty 1

## 2019-08-09 MED ORDER — LORATADINE 10 MG PO TABS
10.0000 mg | ORAL_TABLET | Freq: Every evening | ORAL | Status: DC
  Administered 2019-08-09 – 2019-08-11 (×3): 10 mg via ORAL
  Filled 2019-08-09 (×3): qty 1

## 2019-08-09 MED ORDER — KETAMINE HCL 10 MG/ML IJ SOLN
INTRAMUSCULAR | Status: AC
  Filled 2019-08-09: qty 1

## 2019-08-09 MED ORDER — HYDROMORPHONE HCL 1 MG/ML IJ SOLN
1.0000 mg | Freq: Once | INTRAMUSCULAR | Status: AC
  Administered 2019-08-09: 1 mg via INTRAVENOUS
  Filled 2019-08-09: qty 1

## 2019-08-09 MED ORDER — VITAMIN D 25 MCG (1000 UNIT) PO TABS
1000.0000 [IU] | ORAL_TABLET | Freq: Every day | ORAL | Status: DC
  Administered 2019-08-10 – 2019-08-11 (×2): 1000 [IU] via ORAL
  Filled 2019-08-09 (×2): qty 1

## 2019-08-09 MED ORDER — TAMSULOSIN HCL 0.4 MG PO CAPS
0.4000 mg | ORAL_CAPSULE | Freq: Every day | ORAL | Status: DC
  Administered 2019-08-10 – 2019-08-11 (×2): 0.4 mg via ORAL
  Filled 2019-08-09 (×2): qty 1

## 2019-08-09 MED ORDER — DEXAMETHASONE SODIUM PHOSPHATE 10 MG/ML IJ SOLN
INTRAMUSCULAR | Status: DC | PRN
  Administered 2019-08-09: 4 mg via INTRAVENOUS

## 2019-08-09 MED ORDER — ONDANSETRON HCL 4 MG/2ML IJ SOLN
INTRAMUSCULAR | Status: DC | PRN
  Administered 2019-08-09: 4 mg via INTRAVENOUS

## 2019-08-09 MED ORDER — HYDROMORPHONE HCL 2 MG/ML IJ SOLN
INTRAMUSCULAR | Status: AC
  Filled 2019-08-09: qty 1

## 2019-08-09 MED ORDER — VANCOMYCIN HCL 10 G IV SOLR
1250.0000 mg | Freq: Two times a day (BID) | INTRAVENOUS | Status: DC
  Administered 2019-08-10: 1250 mg via INTRAVENOUS
  Filled 2019-08-09 (×2): qty 1250

## 2019-08-09 MED ORDER — ENSURE PRE-SURGERY PO LIQD
296.0000 mL | Freq: Once | ORAL | Status: DC
  Filled 2019-08-09: qty 296

## 2019-08-09 MED ORDER — POLYETHYLENE GLYCOL 3350 17 G PO PACK: 17.0000 g | PACK | Freq: Every day | ORAL | Status: DC | PRN

## 2019-08-09 MED ORDER — HYDROMORPHONE HCL 1 MG/ML IJ SOLN: 1.0000 mg | Freq: Once | INTRAMUSCULAR | Status: DC

## 2019-08-09 MED ORDER — FENTANYL CITRATE (PF) 100 MCG/2ML IJ SOLN
INTRAMUSCULAR | Status: DC | PRN
  Administered 2019-08-09 (×2): 50 ug via INTRAVENOUS

## 2019-08-09 MED ORDER — METFORMIN HCL 850 MG PO TABS
850.0000 mg | ORAL_TABLET | Freq: Every day | ORAL | Status: DC
  Administered 2019-08-10 – 2019-08-11 (×2): 850 mg via ORAL
  Filled 2019-08-09 (×2): qty 1

## 2019-08-09 MED ORDER — OXYCODONE HCL 5 MG/5ML PO SOLN: 5.0000 mg | Freq: Once | ORAL | Status: DC | PRN

## 2019-08-09 MED ORDER — FENTANYL CITRATE (PF) 100 MCG/2ML IJ SOLN
INTRAMUSCULAR | Status: AC
  Filled 2019-08-09: qty 2

## 2019-08-09 MED ORDER — LIDOCAINE-EPINEPHRINE (PF) 2 %-1:200000 IJ SOLN
INTRAMUSCULAR | Status: AC
  Administered 2019-08-09: 08:00:00
  Filled 2019-08-09: qty 20

## 2019-08-09 MED ORDER — LIDOCAINE-EPINEPHRINE 2 %-1:100000 IJ SOLN
20.0000 mL | Freq: Once | INTRAMUSCULAR | Status: DC
  Filled 2019-08-09: qty 20

## 2019-08-09 MED ORDER — ACETAMINOPHEN 500 MG PO TABS
1000.0000 mg | ORAL_TABLET | Freq: Four times a day (QID) | ORAL | Status: AC
  Administered 2019-08-09 – 2019-08-10 (×4): 1000 mg via ORAL
  Filled 2019-08-09 (×4): qty 2

## 2019-08-09 MED ORDER — SODIUM CHLORIDE 0.9 % IV SOLN
INTRAVENOUS | Status: DC
  Administered 2019-08-09: 23:00:00 via INTRAVENOUS

## 2019-08-09 MED ORDER — GUAIFENESIN 200 MG PO TABS
400.0000 mg | ORAL_TABLET | Freq: Every day | ORAL | Status: DC
  Administered 2019-08-10 – 2019-08-11 (×2): 400 mg via ORAL
  Filled 2019-08-09 (×2): qty 2

## 2019-08-09 MED ORDER — HYDROMORPHONE HCL 1 MG/ML IJ SOLN: 0.5000 mg | INTRAMUSCULAR | Status: DC | PRN

## 2019-08-09 MED ORDER — LIDOCAINE 2% (20 MG/ML) 5 ML SYRINGE
INTRAMUSCULAR | Status: DC | PRN
  Administered 2019-08-09: 50 mg via INTRAVENOUS

## 2019-08-09 MED ORDER — VANCOMYCIN HCL 10 G IV SOLR
2000.0000 mg | Freq: Once | INTRAVENOUS | Status: AC
  Administered 2019-08-09: 2000 mg via INTRAVENOUS
  Filled 2019-08-09: qty 2000

## 2019-08-09 MED ORDER — FENTANYL CITRATE (PF) 100 MCG/2ML IJ SOLN
100.0000 ug | Freq: Once | INTRAMUSCULAR | Status: AC
  Administered 2019-08-09: 100 ug via INTRAVENOUS
  Filled 2019-08-09: qty 2

## 2019-08-09 MED ORDER — OXYCODONE HCL 5 MG PO TABS
5.0000 mg | ORAL_TABLET | ORAL | Status: DC | PRN
  Administered 2019-08-09 – 2019-08-11 (×3): 10 mg via ORAL
  Filled 2019-08-09 (×3): qty 2

## 2019-08-09 MED ORDER — NICOTINE 21 MG/24HR TD PT24
21.0000 mg | MEDICATED_PATCH | Freq: Once | TRANSDERMAL | Status: AC
  Administered 2019-08-09: 21 mg via TRANSDERMAL
  Filled 2019-08-09 (×2): qty 1

## 2019-08-09 MED ORDER — CEFAZOLIN SODIUM-DEXTROSE 2-4 GM/100ML-% IV SOLN
2.0000 g | INTRAVENOUS | Status: AC
  Administered 2019-08-09: 20:00:00 2 g via INTRAVENOUS

## 2019-08-09 MED ORDER — LACTATED RINGERS IR SOLN
Status: DC | PRN
  Administered 2019-08-09: 9000 mL

## 2019-08-09 MED ORDER — MIDAZOLAM HCL 2 MG/2ML IJ SOLN
INTRAMUSCULAR | Status: AC
  Filled 2019-08-09: qty 2

## 2019-08-09 MED ORDER — ACETAMINOPHEN 325 MG PO TABS: 325.0000 mg | ORAL_TABLET | ORAL | Status: DC | PRN

## 2019-08-09 MED ORDER — DEXTROSE 5 % IV SOLN: 3.0000 g | INTRAVENOUS | Status: DC

## 2019-08-09 MED ORDER — PRAVASTATIN SODIUM 20 MG PO TABS
40.0000 mg | ORAL_TABLET | Freq: Every day | ORAL | Status: DC
  Administered 2019-08-10 – 2019-08-11 (×2): 40 mg via ORAL
  Filled 2019-08-09 (×2): qty 2

## 2019-08-09 MED ORDER — HYDROMORPHONE HCL 1 MG/ML IJ SOLN
INTRAMUSCULAR | Status: DC | PRN
  Administered 2019-08-09 (×2): 0.5 mg via INTRAVENOUS

## 2019-08-09 MED ORDER — AMLODIPINE BESYLATE 5 MG PO TABS
5.0000 mg | ORAL_TABLET | Freq: Every day | ORAL | Status: DC
  Administered 2019-08-10 – 2019-08-11 (×2): 5 mg via ORAL
  Filled 2019-08-09 (×2): qty 1

## 2019-08-09 MED ORDER — HYDROMORPHONE HCL 1 MG/ML IJ SOLN
0.5000 mg | INTRAMUSCULAR | Status: DC | PRN
  Administered 2019-08-09: 0.5 mg via INTRAVENOUS
  Filled 2019-08-09: qty 1

## 2019-08-09 MED ORDER — LACTATED RINGERS IV SOLN: INTRAVENOUS | Status: DC

## 2019-08-09 MED ORDER — MEPERIDINE HCL 50 MG/ML IJ SOLN: 6.2500 mg | INTRAMUSCULAR | Status: DC | PRN

## 2019-08-09 MED ORDER — POVIDONE-IODINE 10 % EX SWAB
2.0000 "application " | Freq: Once | CUTANEOUS | Status: AC
  Administered 2019-08-09: 2 via TOPICAL

## 2019-08-09 SURGICAL SUPPLY — 39 items
BLADE CUDA SHAVER 3.5 (BLADE) ×3 IMPLANT
BLADE SAW SGTL 11.0X1.19X90.0M (BLADE) IMPLANT
BLADE SURG SZ11 CARB STEEL (BLADE) IMPLANT
BNDG CMPR MED 15X6 ELC VLCR LF (GAUZE/BANDAGES/DRESSINGS) ×1
BNDG ELASTIC 6X15 VLCR STRL LF (GAUZE/BANDAGES/DRESSINGS) ×2 IMPLANT
BNDG ELASTIC 6X5.8 VLCR STR LF (GAUZE/BANDAGES/DRESSINGS) IMPLANT
CLOTH BEACON ORANGE TIMEOUT ST (SAFETY) ×3 IMPLANT
COUNTER NEEDLE 20 DBL MAG RED (NEEDLE) ×1 IMPLANT
COVER WAND RF STERILE (DRAPES) IMPLANT
DRAPE U-SHAPE 47X51 STRL (DRAPES) IMPLANT
DRSG EMULSION OIL 3X3 NADH (GAUZE/BANDAGES/DRESSINGS) ×3 IMPLANT
DRSG PAD ABDOMINAL 8X10 ST (GAUZE/BANDAGES/DRESSINGS) ×2 IMPLANT
DURAPREP 26ML APPLICATOR (WOUND CARE) ×3 IMPLANT
EVACUATOR 1/8 PVC DRAIN (DRAIN) ×2 IMPLANT
GAUZE 4X4 16PLY RFD (DISPOSABLE) ×3 IMPLANT
GAUZE SPONGE 4X4 12PLY STRL (GAUZE/BANDAGES/DRESSINGS) ×2 IMPLANT
GAUZE XEROFORM 1X8 LF (GAUZE/BANDAGES/DRESSINGS) ×2 IMPLANT
GLOVE BIOGEL M 7.0 STRL (GLOVE) IMPLANT
GLOVE BIOGEL PI IND STRL 7.5 (GLOVE) ×1 IMPLANT
GLOVE BIOGEL PI INDICATOR 7.5 (GLOVE) ×2
GLOVE ORTHO TXT STRL SZ7.5 (GLOVE) ×3 IMPLANT
GOWN STRL REUS W/TWL LRG LVL3 (GOWN DISPOSABLE) ×3 IMPLANT
GOWN STRL REUS W/TWL XL LVL3 (GOWN DISPOSABLE) IMPLANT
KIT BASIN OR (CUSTOM PROCEDURE TRAY) ×3 IMPLANT
KIT TURNOVER KIT A (KITS) IMPLANT
LEGGING LITHOTOMY PAIR STRL (DRAPES) ×1 IMPLANT
MANIFOLD NEPTUNE II (INSTRUMENTS) ×3 IMPLANT
MARKER SKIN DUAL TIP RULER LAB (MISCELLANEOUS) ×1 IMPLANT
PACK ARTHROSCOPY WL (CUSTOM PROCEDURE TRAY) ×3 IMPLANT
PAD CAST 4YDX4 CTTN HI CHSV (CAST SUPPLIES) IMPLANT
PAD MASON LEG HOLDER (PIN) ×3 IMPLANT
PADDING CAST COTTON 4X4 STRL (CAST SUPPLIES) ×3
PADDING CAST COTTON 6X4 STRL (CAST SUPPLIES) ×3 IMPLANT
SUT ETHILON 4 0 PS 2 18 (SUTURE) ×3 IMPLANT
SYR 30ML LL (SYRINGE) ×3 IMPLANT
TOWEL OR 17X26 10 PK STRL BLUE (TOWEL DISPOSABLE) ×3 IMPLANT
TUBING ARTHRO INFLOW-ONLY STRL (TUBING) ×3 IMPLANT
TUBING ARTHROSCOPY IRRIG 16FT (MISCELLANEOUS) ×2 IMPLANT
WRAP KNEE MAXI GEL POST OP (GAUZE/BANDAGES/DRESSINGS) ×3 IMPLANT

## 2019-08-09 NOTE — ED Notes (Signed)
This nurse wasted 1mg  of Dilaudid with Charge nurse Noni Saupe for this patient. The patient was no longer in the pyxis at this facility for me to document. [facility] called to update on situation. Note was made in the [facility].

## 2019-08-09 NOTE — Brief Op Note (Signed)
08/09/2019  8:13 PM  PATIENT:  [name]  58 y.o. male  PRE-OPERATIVE DIAGNOSIS:  right knee infection status post right knee arthroscopy  POST-OPERATIVE DIAGNOSIS:  right knee infection status post right knee arthroscopy  PROCEDURE:  Procedure(s): ARTHROSCOPY KNEE/WASH OUT (Right)  SURGEON:  Surgeon(s) and Role:    Paralee Cancel, MD - Primary  PHYSICIAN ASSISTANT: None  ANESTHESIA:   general  EBL:  Minimal  BLOOD ADMINISTERED:none  DRAINS: none   LOCAL MEDICATIONS USED:  NONE  SPECIMEN:  Source of Specimen:  right knee fluid  DISPOSITION OF SPECIMEN:  PATHOLOGY  COUNTS:  YES  TOURNIQUET:  * No tourniquets in log *  DICTATION: .Other Dictation: Dictation Number [id number]  PLAN OF CARE: Admit for overnight observation  PATIENT DISPOSITION:  PACU - hemodynamically stable.   Delay start of Pharmacological VTE agent (>24hrs) due to surgical blood loss or risk of bleeding: no

## 2019-08-09 NOTE — Progress Notes (Signed)
Pharmacy Antibiotic Note  [name] [name]. is a 58 y.o. male admitted on 08/09/2019 with wound infection.  Pharmacy has been consulted for vancomycin dosing.  Plan: Ancef 2 Gm IV q6h x 3 doses post-op Vancomycin 2 Gm x1 then 1250 mg IV q12h for est AUC = 491 Goal AUC = 400-550 F/u scr/cultures/levels  Height: 5\' 10"  (177.8 cm) Weight: 191 lb 12.8 oz (87 kg) IBW/kg (Calculated) : 73  Temp (24hrs), Avg:98.3 F (36.8 C), Min:97.7 F (36.5 C), Max:99.1 F (37.3 C)  Recent Labs  Lab 08/09/19 0637  WBC 7.8  CREATININE 0.92    Estimated Creatinine Clearance: 92.6 mL/min (by C-G formula based on SCr of 0.92 mg/dL).    Allergies  Allergen Reactions  . Penicillins Itching and Other (See Comments)    Has patient had a PCN reaction causing immediate rash, facial/tongue/throat swelling, SOB or lightheadedness with hypotension: No Has patient had a PCN reaction causing severe rash involving mucus membranes or skin necrosis: No Has patient had a PCN reaction that required hospitalization No Has patient had a PCN reaction occurring within the last 10 years: No If all of the above answers are "NO", then may proceed with Cephalosporin use.     Antimicrobials this admission: 8/17 ancef >>  8/17 vancomycin >>   Dose adjustments this admission:   Microbiology results:  BCx:   UCx:    Sputum:    MRSA PCR:   Thank you for allowing pharmacy to be a part of this patient's care.  Dorrene German 08/09/2019 11:30 PM

## 2019-08-09 NOTE — ED Provider Notes (Addendum)
[facility] EMERGENCY DEPARTMENT Provider Note   CSN: [id number] Arrival date & time: 08/09/19  0602    History   Chief Complaint Chief Complaint  Patient presents with   Knee Pain    HPI [name] [name]. is a 58 y.o. male.     HPI  The patient is a 58 year old male, he has a known history of alcohol abuse, bipolar disorder, diabetes and substance abuse.  He has had some knee pain on his right knee and ultimately underwent a arthroscopy of the right knee and August on the sixth of 2020.  He had some postoperative pain and was seen on 12 August in the office, at that time he had a small knee effusion that was noted, there was some serosanguineous drainage from the lateral port site and the patient was complaining of uncontrolled pain and thus was given 42 Percocet.  He presents today stating that sometime in the early hours of the morning he twisted his knee and felt acute onset of worsening pain.  He now reports that his pain is severe.  The patient actually refuses to do much talking and moans groaning rolling around on the bed.  This despite getting 1 mg of hydromorphone on arrival.  It is very difficult to elicit any more information from this patient as he is not participating with history and barely participating with physical exam.  He denies fevers, states that he still has drainage from his lateral knee port  Past Medical History:  Diagnosis Date   Acid reflux    Alcohol abuse    6-8 cans of beer daily; hx of incarceration for DWI   Arthritis    Bipolar disorder ([place])    Bronchitis    Chronic back pain    Chronic neck pain    Chronic pain    Diabetes mellitus without complication ([place])    Fracture of lower leg    Gout    Hyperlipidemia    Hypertension    Left arm pain    chronic   Migraine    Pancreatitis    March 2013   Pseudocyst of pancreas 02/28/2012   Substance abuse ([place])     Patient Active Problem List   Diagnosis Date Noted   Septic  arthritis ([place]) 08/09/2019   S/P right knee arthroscopy 07/29/2019 08/03/2019   Acute lateral meniscus tear of right knee    Acute medial meniscus tear of right knee    Substance use disorder 03/16/2019   History of diabetes mellitus 10/10/2016   Avascular necrosis of hip, left ([place]) 10/04/2016   Status post left hip replacement 10/04/2016   Rash and nonspecific skin eruption 08/12/2013   Cholelithiases 08/04/2013   Subcutaneous nodule 71/69/6789   Acute alcoholic pancreatitis 38/09/1750   Syncope 04/11/2013   Prolonged Q-T interval on ECG 04/11/2013   Cocaine abuse ([place]) 03/13/2013    Class: Chronic   At risk for adverse drug event 02/14/2013   DDD (degenerative disc disease), lumbar 02/01/2013   Dyspepsia 12/01/2012   BPH (benign prostatic hyperplasia) 10/07/2012   Allergic rhinitis 10/03/2012   Transaminitis 10/02/2012   Chronic pain syndrome 10/02/2012   Essential hypertension, benign 07/01/2012   Tobacco user 07/01/2012   Hepatic steatosis 03/06/2012   ED (erectile dysfunction) 03/06/2012   Pseudocyst of pancreas 02/28/2012   Alcohol abuse 02/25/2012   GERD (gastroesophageal reflux disease) 02/23/2012   Bipolar 1 disorder ([place]) 02/23/2012   Hypertriglyceridemia 12/05/2011    Past Surgical History:  Procedure Laterality Date  CIRCUMCISION  03/17/2012   Procedure: CIRCUMCISION ADULT;  Surgeon: Marissa Nestle, MD;  Location: [facility];  Service: Urology;  Laterality: N/A;   COLONOSCOPY WITH PROPOFOL  12/24/2012   JAS:NKNL lesion as described above status post biopsy; otherwise normal rectum/Sigmoid diverticulosis/Sigmoid polyps -- resected as described above. anal lesion, benign. colon polyp, hyperplastic   ESOPHAGOGASTRODUODENOSCOPY (EGD) WITH PROPOFOL  12/24/2012   ZJQ:BHALPFX erythema and erosions of uncertain significance status post gastric biopsy (reactive gastropathy NO h.pylori)   KNEE ARTHROSCOPY WITH MEDIAL MENISECTOMY Right 07/29/2019    Procedure: RIGHT KNEE ARTHROSCOPY WITH MEDIAL MENISCECTOMY AND LATERAL MENISCECTOMY;  Surgeon: Carole Civil, MD;  Location: [facility];  Service: Orthopedics;  Laterality: Right;   NOSE SURGERY     broken nose   TOTAL HIP ARTHROPLASTY Left 10/04/2016   Procedure: LEFT TOTAL HIP ARTHROPLASTY ANTERIOR APPROACH;  Surgeon: Mcarthur Rossetti, MD;  Location: [facility];  Service: Orthopedics;  Laterality: Left;        Home Medications    Prior to Admission medications   Medication Sig Start Date End Date Taking? Authorizing Provider  amLODipine (NORVASC) 5 MG tablet Take 5 mg by mouth daily.    [provider]  celecoxib (CELEBREX) 100 MG capsule Take 100 mg by mouth daily as needed for moderate pain.     [provider]  cholecalciferol (VITAMIN D3) 25 MCG (1000 UT) tablet Take 1,000 Units by mouth daily.    [provider]  fluticasone (FLONASE) 50 MCG/ACT nasal spray Place 2 sprays into both nostrils daily as needed for allergies.     [provider]  gabapentin (NEURONTIN) 300 MG capsule Take 300 mg by mouth 3 (three) times daily as needed (for pain).  05/14/19   [provider]  guaifenesin (MUCUS RELIEF) 400 MG TABS tablet Take 400 mg by mouth daily.    [provider]  HYDROcodone-acetaminophen (NORCO) 10-325 MG tablet Take 1 tablet by mouth every 4 (four) hours as needed. 07/29/19   Carole Civil, MD  levocetirizine (XYZAL) 5 MG tablet Take 5 mg by mouth every evening.    [provider]  lisinopril (PRINIVIL,ZESTRIL) 10 MG tablet Take 10 mg by mouth daily.  01/30/18   [provider]  loratadine (CLARITIN) 10 MG tablet Take 10 mg by mouth daily.    [provider]  metFORMIN (GLUCOPHAGE) 850 MG tablet Take 850 mg by mouth daily with breakfast.  01/30/18   [provider]  Multiple Vitamin (MULTIVITAMIN WITH MINERALS) TABS Take 1 tablet by mouth daily.    [provider]  Omega-3  Fatty Acids (FISH OIL) 1000 MG CAPS Take 1,000 mg by mouth daily.    [provider]  omeprazole (PRILOSEC) 40 MG capsule Take 40 mg by mouth daily.  01/30/18   [provider]  oxyCODONE-acetaminophen (PERCOCET/ROXICET) 5-325 MG tablet Take 1 tablet by mouth every 4 (four) hours as needed for severe pain. 08/04/19   Carole Civil, MD    Family History Family History  Problem Relation Age of Onset   Diabetes Father    Anesthesia problems Neg Hx    Hypotension Neg Hx    Malignant hyperthermia Neg Hx    Pseudochol deficiency Neg Hx    Colon cancer Neg Hx    Heart disease Neg Hx    Stroke Neg Hx    Cancer Neg Hx     Social History Social History   Tobacco Use   Smoking status: Current Every Day  Smoker    Packs/day: 0.50    Years: 25.00    Pack years: 12.50    Types: Cigarettes   Smokeless tobacco: Former Systems developer    Types: Chew  Substance Use Topics   Alcohol use: Yes    Comment: once a week   Drug use: Not Currently    Types: Cocaine, Marijuana    Comment: 06/26/19 - Cocaine     Allergies   Penicillins   Review of Systems Review of Systems  Unable to perform ROS: Acuity of condition     Physical Exam Updated Vital Signs BP 99/65    Pulse 78    Temp 98 F (36.7 C) (Oral)    Resp (!) 22    SpO2 97%   Physical Exam Vitals signs and nursing note reviewed.  Constitutional:      Appearance: He is well-developed.     Comments: Rocking back and forth on the bed moaning  HENT:     Head: Normocephalic and atraumatic.     Mouth/Throat:     Pharynx: No oropharyngeal exudate.  Eyes:     General: No scleral icterus.       Right eye: No discharge.        Left eye: No discharge.     Conjunctiva/sclera: Conjunctivae normal.     Pupils: Pupils are equal, round, and reactive to light.  Neck:     Musculoskeletal: Normal range of motion and neck supple.     Thyroid: No thyromegaly.     Vascular: No JVD.  Cardiovascular:     Rate and  Rhythm: Normal rate and regular rhythm.     Heart sounds: Normal heart sounds. No murmur. No friction rub. No gallop.   Pulmonary:     Effort: Pulmonary effort is normal. No respiratory distress.     Breath sounds: Normal breath sounds. No wheezing or rales.  Abdominal:     General: Bowel sounds are normal. There is no distension.     Palpations: Abdomen is soft. There is no mass.     Tenderness: There is no abdominal tenderness.  Musculoskeletal:        General: Swelling and tenderness present.     Comments: Bilateral arms and left leg with normal range of motion in joints, right lower extremity with some bruising distal to the knee, moderate to large joint effusion clinically, normal joint and exam distal to the knee on the right.  Calf is soft on the right, pulses are palpable at the foot  Lymphadenopathy:     Cervical: No cervical adenopathy.  Skin:    General: Skin is warm and dry.     Findings: Bruising present. No erythema or rash.  Neurological:     Mental Status: He is alert.     Coordination: Coordination normal.     Comments: The patient is clearly moving all 4 extremities, he is not talking very much, more moaning and occasional speech, follows commands without difficulty.  He will not straight leg raise on the right secondary to pain  Psychiatric:        Behavior: Behavior normal.      ED Treatments / Results  Labs (all labs ordered are listed, but only abnormal results are displayed) Labs Reviewed  BASIC METABOLIC PANEL - Abnormal; Notable for the following components:      Result Value   CO2 21 (*)    Glucose, Bld 132 (*)    All other components within normal limits  SYNOVIAL CELL  COUNT + DIFF, W/ CRYSTALS - Abnormal; Notable for the following components:   Appearance-Synovial CLOUDY (*)    WBC, Synovial 71,400 (*)    Neutrophil, Synovial 97 (*)    Monocyte-Macrophage-Synovial Fluid 3 (*)    All other components within normal limits  ETHANOL - Abnormal;  Notable for the following components:   Alcohol, Ethyl (B) 10 (*)    All other components within normal limits  CULTURE, BLOOD (ROUTINE X 2)  GRAM STAIN  CULTURE, BODY FLUID-BOTTLE  CULTURE, BLOOD (ROUTINE X 2)  ANAEROBIC CULTURE  GONOCOCCUS CULTURE  SARS CORONAVIRUS 2 (HOSPITAL ORDER, [place] [facility])  CBC WITH DIFFERENTIAL/PLATELET  SEDIMENTATION RATE  C-REACTIVE PROTEIN    EKG None  Radiology Dg Knee Complete 4 Views Right  Result Date: 08/09/2019 CLINICAL DATA:  Right knee arthroscopic surgery on 07/29/2019 with worsening knee pain. EXAM: RIGHT KNEE - COMPLETE 4+ VIEW COMPARISON:  Right knee radiographs-05/27/2019 FINDINGS: Mild soft tissue swelling about the knee with large knee joint effusion. No evidence of lipohemarthrosis. No fracture or dislocation. No radiopaque foreign body. Mild tricompartmental degenerative change of the knee, likely worse within the lateral compartment with joint space loss, subchondral sclerosis osteophytosis. Minimal spurring of the tibial spines. No evidence of chondrocalcinosis. IMPRESSION: Mild soft tissue swelling about the knee and large knee joint effusion without associated fracture, nonspecific though potentially postoperative in etiology. Electronically Signed   By: Sandi Mariscal M.D.   On: 08/09/2019 07:25    Procedures .Joint Aspiration/Arthrocentesis  Date/Time: 08/09/2019 7:52 AM Performed by: Noemi Chapel, MD Authorized by: Noemi Chapel, MD   Consent:    Consent obtained:  Verbal   Consent given by:  Patient   Risks discussed:  Bleeding, infection, pain, incomplete drainage and nerve damage   Alternatives discussed:  No treatment Location:    Location:  Knee   Knee:  R knee Anesthesia (see MAR for exact dosages):    Anesthesia method:  Local infiltration   Local anesthetic:  Lidocaine 1% WITH epi Procedure details:    Preparation: Patient was prepped and draped in usual sterile fashion     Needle gauge:  18  G   Ultrasound guidance: no     Approach:  Lateral   Aspirate amount:  30   Aspirate characteristics:  Cloudy and yellow   Steroid injected: no     Specimen collected: yes   Post-procedure details:    Dressing:  Sterile dressing   Patient tolerance of procedure:  Tolerated well, no immediate complications Comments:         (including critical care time)  Medications Ordered in ED Medications  lidocaine-EPINEPHrine (XYLOCAINE W/EPI) 2 %-1:100000 (with pres) injection 20 mL (20 mLs Infiltration Not Given 08/09/19 0740)  HYDROmorphone (DILAUDID) injection 0.5 mg (has no administration in time range)  HYDROmorphone (DILAUDID) injection 1 mg (1 mg Intravenous Given 08/09/19 0641)  lidocaine-EPINEPHrine (XYLOCAINE W/EPI) 2 %-1:200000 (PF) injection (  Given by Other 08/09/19 0739)  HYDROmorphone (DILAUDID) injection 1 mg (1 mg Intravenous Given 08/09/19 0900)     Initial Impression / Assessment and Plan / ED Course  I have reviewed the triage vital signs and the nursing notes.  Pertinent labs & imaging results that were available during my care of the patient were reviewed by me and considered in my medical decision making (see chart for details).  Clinical Course as of Aug 08 1504  Mon Aug 09, 2019  0752 The patient has no leukocytosis, metabolic panel is unremarkable,  CRP is normal, sed rate is pending.  His arthrocentesis went smoothly with 30 cc of very cloudy yellow fluid removed.  This was not a hemarthrosis.  I have personally looked at and interpreted the x-ray and find there to be no significant bony abnormalities though he does have some arthritis, there is a large joint effusion.   [BM]  0945 No crystals seen, 71,400 white blood cells with 97% neutrophils consistent with an infected joint.  Will discuss with orthopedics, antibiotics will need to be started in conjunction with orthopedic recommendations   [BM]  1005 Dw Dr. Aline Brochure - currently out of town / out of state - not on  call - recommends on call orthopedics for wash out.   [BM]  1030 I discussed the case with Dr. Alvan Dame of the orthopedic service in [place] who has accepted the patient in transfer to [facility].  I will place holding orders, he has requested Ancef   [BM]  1504 While awaiting transport (no ambulance available at this time) despite having a room available at the receiving hospital the patient is very frustrated, he wants to have a private transport with a family member.  I have said that this is inappropriate given the opiate medications that he has been given and the risk for decompensation or injury in route to the other hospital.  The patient is very frustrated and upset with me and upset with the system for taking so long.  I certainly sympathize with his frustration.  He will be given another dose of pain medicine while awaiting transport.  He also wants to smoke and I have made it clear to the patient that he cannot leave the emergency department to go do this.  He is hesitantly agreeable   [BM]    Clinical Course User Index [BM] Noemi Chapel, MD       It is difficult to tell whether this patient's symptoms are all from an injury to the knee whether this was a strain, hemarthrosis or potentially infection however given the drainage that he has had it is concerning.  He is afebrile without a leukocytosis.  He does have a history of substance abuse, will check arthrocentesis, he has already received IV Dilaudid.  Final Clinical Impressions(s) / ED Diagnoses   Final diagnoses:  Pyogenic arthritis of right knee joint, due to unspecified organism [facility])      Noemi Chapel, MD 08/09/19 1032    Noemi Chapel, MD 08/09/19 1505

## 2019-08-09 NOTE — Anesthesia Preprocedure Evaluation (Addendum)
Anesthesia Evaluation   Patient awake    Reviewed: Allergy & Precautions, NPO status , Patient's Chart, lab work & pertinent test results  Airway Mallampati: I  TM Distance: >3 FB Neck ROM: Full    Dental  (+) Edentulous Upper, Edentulous Lower   Pulmonary Current Smoker,    Pulmonary exam normal        Cardiovascular Exercise Tolerance: Good hypertension, Pt. on medications Normal cardiovascular exam Rhythm:Regular Rate:Normal  ORENTHAL, DEBSKI LX:[id number] 27-Jul-2019 11:33:29 [place] [facility] ROUTINE RECORD Normal sinus rhythm Normal ECG   Neuro/Psych  Headaches, PSYCHIATRIC DISORDERS Bipolar Disorder    GI/Hepatic GERD  Medicated and Controlled,(+)     substance abuse (last use of cacaine and marijuana - 06/28/19)  alcohol use, cocaine use and marijuana use,   Endo/Other  diabetes, Well Controlled, Type 2  Renal/GU      Musculoskeletal  (+) Arthritis ,   Abdominal   Peds  Hematology   Anesthesia Other Findings   Reproductive/Obstetrics                            Anesthesia Physical  Anesthesia Plan  ASA: III  Anesthesia Plan: General   Post-op Pain Management:    Induction: Intravenous  PONV Risk Score and Plan: 1  Airway Management Planned: LMA and Oral ETT  Additional Equipment:   Intra-op Plan:   Post-operative Plan:   Informed Consent: I have reviewed the patients History and Physical, chart, labs and discussed the procedure including the risks, benefits and alternatives for the proposed anesthesia with the patient or authorized representative who has indicated his/her understanding and acceptance.       Plan Discussed with: CRNA, Anesthesiologist and Surgeon  Anesthesia Plan Comments:         Anesthesia Quick Evaluation

## 2019-08-09 NOTE — Transfer of Care (Signed)
Immediate Anesthesia Transfer of Care Note  Patient: [name] [name].  Procedure(s) Performed: Procedure(s): arthroscopic incision and drainage snovectomy (Right)  Patient Location: PACU  Anesthesia Type:General  Level of Consciousness:  sedated, patient cooperative and responds to stimulation  Airway & Oxygen Therapy:Patient Spontanous Breathing and Patient connected to face mask oxgen  Post-op Assessment:  Report given to PACU RN and Post -op Vital signs reviewed and stable  Post vital signs:  Reviewed and stable  Last Vitals:  Vitals:   08/09/19 1809 08/09/19 2122  BP: (!) 144/94   Pulse: 83   Resp: 16   Temp: 37.3 C (P) 37.1 C  SpO2: 56%     Complications: No apparent anesthesia complications

## 2019-08-09 NOTE — Interval H&P Note (Signed)
History and Physical Interval Note:  08/09/2019 8:12 PM  [name]  has presented today for surgery, with the diagnosis of right knee infection.  The various methods of treatment have been discussed with the patient and family. After consideration of risks, benefits and other options for treatment, the patient has consented to  Procedure(s): ARTHROSCOPY KNEE/WASH OUT (Right) as a surgical intervention.  The patient's history has been reviewed, patient examined, no change in status, stable for surgery.  I have reviewed the patient's chart and labs.  Questions were answered to the patient's satisfaction.     Mauri Pole

## 2019-08-09 NOTE — ED Triage Notes (Signed)
Pt c/o right knee pain with swelling, pt recently had surgery, states " I may have overdone it" pt clammy, swelling noted to right knee, pt screaming upon arrival to er,

## 2019-08-09 NOTE — ED Notes (Signed)
In to assist dr Sabra Heck with pulling fluid off of his right knee. After dr Sabra Heck had pulled 5ml of thick pale yellow fluid the patient moved his leg causing the needle to come out of the patient's knee.

## 2019-08-09 NOTE — Anesthesia Procedure Notes (Signed)
Procedure Name: LMA Insertion Date/Time: 08/09/2019 8:26 PM Performed by: Anne Fu, CRNA Pre-anesthesia Checklist: Patient identified, Emergency Drugs available, Suction available, Patient being monitored and Timeout performed Patient Re-evaluated:Patient Re-evaluated prior to induction Oxygen Delivery Method: Circle system utilized Preoxygenation: Pre-oxygenation with 100% oxygen Induction Type: IV induction Ventilation: Mask ventilation without difficulty LMA: LMA inserted LMA Size: 4.0 Number of attempts: 1 Placement Confirmation: positive ETCO2 and breath sounds checked- equal and bilateral Tube secured with: Tape

## 2019-08-09 NOTE — H&P (Signed)
H&P  Patient is being admitted for right knee arthroscopic irrigation and debridement, secondary to acute septic knee.  Subjective:  Chief Complaint:right knee pain.  HPI: [name] [name]., 58 y.o. male, he has a known history of alcohol abuse, bipolar disorder, diabetes, and substance abuse. He recently underwent a right knee arthroscopy with medial and lateral menisectomy by Dr. Aline Brochure on 07/29/19. He was having post-operative pain, and was seen in Dr. Ruthe Mannan office on 08/04/19. At that time, he was noted to have a small effusion with some serosanguineous drainage from the lateral port site. He was having significant pain, and was prescribed Percocet for pain management. Patient then presented to [facility] ED on 08/09/19 due to worsening pain. He states he twisted his knee, and the pain became worse. Radiographs of right knee dated 08/09/19 show mild soft tissue swelling and large knee joint effusion without associated fracture. Right knee aspirated in the [facility] ED of 30 cc cloudy joint fluid, which was sent for cell count, gram stain, and culture. Denies fever. Patient transferred to [facility] in preparation for arthroscopic irrigation and debridement with Dr. Alvan Dame. Patient also has a history of left total hip arthroplasty by Dr. Ninfa Linden in 2017.  Patient Active Problem List   Diagnosis Date Noted  . Septic arthritis ([place]) 08/09/2019  . S/P right knee arthroscopy 07/29/2019 08/03/2019  . Acute lateral meniscus tear of right knee   . Acute medial meniscus tear of right knee   . Substance use disorder 03/16/2019  . History of diabetes mellitus 10/10/2016  . Avascular necrosis of hip, left ([place]) 10/04/2016  . Status post left hip replacement 10/04/2016  . Rash and nonspecific skin eruption 08/12/2013  . Cholelithiases 08/04/2013  . Subcutaneous nodule 07/21/2013  . Acute alcoholic pancreatitis 02/63/7858  . Syncope 04/11/2013  . Prolonged Q-T interval on ECG 04/11/2013  . Cocaine  abuse ([place]) 03/13/2013    Class: Chronic  . At risk for adverse drug event 02/14/2013  . DDD (degenerative disc disease), lumbar 02/01/2013  . Dyspepsia 12/01/2012  . BPH (benign prostatic hyperplasia) 10/07/2012  . Allergic rhinitis 10/03/2012  . Transaminitis 10/02/2012  . Chronic pain syndrome 10/02/2012  . Essential hypertension, benign 07/01/2012  . Tobacco user 07/01/2012  . Hepatic steatosis 03/06/2012  . ED (erectile dysfunction) 03/06/2012  . Pseudocyst of pancreas 02/28/2012  . Alcohol abuse 02/25/2012  . GERD (gastroesophageal reflux disease) 02/23/2012  . Bipolar 1 disorder ([place]) 02/23/2012  . Hypertriglyceridemia 12/05/2011   Past Medical History:  Diagnosis Date  . Acid reflux   . Alcohol abuse    6-8 cans of beer daily; hx of incarceration for DWI  . Arthritis   . Bipolar disorder ([place])   . Bronchitis   . Chronic back pain   . Chronic neck pain   . Chronic pain   . Diabetes mellitus without complication ([place])   . Fracture of lower leg   . Gout   . Hyperlipidemia   . Hypertension   . Left arm pain    chronic  . Migraine   . Pancreatitis    March 2013  . Pseudocyst of pancreas 02/28/2012  . Substance abuse [facility])     Past Surgical History:  Procedure Laterality Date  . CIRCUMCISION  03/17/2012   Procedure: CIRCUMCISION ADULT;  Surgeon: Marissa Nestle, MD;  Location: [facility];  Service: Urology;  Laterality: N/A;  . COLONOSCOPY WITH PROPOFOL  12/24/2012   IFO:YDXA lesion as described above status post biopsy; otherwise normal  rectum/Sigmoid diverticulosis/Sigmoid polyps -- resected as described above. anal lesion, benign. colon polyp, hyperplastic  . ESOPHAGOGASTRODUODENOSCOPY (EGD) WITH PROPOFOL  12/24/2012   KYH:CWCBJSE erythema and erosions of uncertain significance status post gastric biopsy (reactive gastropathy NO h.pylori)  . KNEE ARTHROSCOPY WITH MEDIAL MENISECTOMY Right 07/29/2019   Procedure: RIGHT KNEE ARTHROSCOPY WITH MEDIAL MENISCECTOMY AND LATERAL  MENISCECTOMY;  Surgeon: Carole Civil, MD;  Location: [facility];  Service: Orthopedics;  Laterality: Right;  . NOSE SURGERY     broken nose  . TOTAL HIP ARTHROPLASTY Left 10/04/2016   Procedure: LEFT TOTAL HIP ARTHROPLASTY ANTERIOR APPROACH;  Surgeon: Mcarthur Rossetti, MD;  Location: [facility];  Service: Orthopedics;  Laterality: Left;    Current Facility-Administered Medications  Medication Dose Route Frequency Provider Last Rate Last Dose  . HYDROmorphone (DILAUDID) injection 0.5 mg  0.5 mg Intravenous Q4H PRN Noemi Chapel, MD   0.5 mg at 08/09/19 1216  . HYDROmorphone (DILAUDID) injection 1 mg  1 mg Intravenous Once Noemi Chapel, MD      . lidocaine-EPINEPHrine (XYLOCAINE W/EPI) 2 %-1:100000 (with pres) injection 20 mL  20 mL Infiltration Once Noemi Chapel, MD      . nicotine (NICODERM CQ - dosed in mg/24 hours) patch 21 mg  21 mg Transdermal Once Noemi Chapel, MD   21 mg at 08/09/19 1535   Allergies  Allergen Reactions  . Penicillins Itching and Other (See Comments)    Has patient had a PCN reaction causing immediate rash, facial/tongue/throat swelling, SOB or lightheadedness with hypotension: No Has patient had a PCN reaction causing severe rash involving mucus membranes or skin necrosis: No Has patient had a PCN reaction that required hospitalization No Has patient had a PCN reaction occurring within the last 10 years: No If all of the above answers are "NO", then may proceed with Cephalosporin use.     Social History   Tobacco Use  . Smoking status: Current Every Day Smoker    Packs/day: 0.50    Years: 25.00    Pack years: 12.50    Types: Cigarettes  . Smokeless tobacco: Former Systems developer    Types: Chew  Substance Use Topics  . Alcohol use: Yes    Comment: once a week    Family History  Problem Relation Age of Onset  . Diabetes Father   . Anesthesia problems Neg Hx   . Hypotension Neg Hx   . Malignant hyperthermia Neg Hx   . Pseudochol deficiency Neg Hx   .  Colon cancer Neg Hx   . Heart disease Neg Hx   . Stroke Neg Hx   . Cancer Neg Hx      ROS  Objective:  Physical Exam  Constitutional: He appears well-developed.  HENT:  Head: Normocephalic and atraumatic.  Respiratory: Effort normal.  Musculoskeletal: Right LE: Bruising present about the knee. Skin intact. Effusion present. ROM with pain. Calf soft, and distal pulses palpable. Sensation intact.   Vital signs in last 24 hours: Temp:  [98 F (36.7 C)] 98 F (36.7 C) (08/17 0618) Pulse Rate:  [78-91] 91 (08/17 1600) Resp:  [16-22] 18 (08/17 1600) BP: (99-146)/(65-112) 131/86 (08/17 1600) SpO2:  [89 %-99 %] 95 % (08/17 1600)  Labs: Estimated body mass index is 27.84 kg/m as calculated from the following:   Height as of 07/29/19: 5\' 10"  (1.778 m).   Weight as of 07/29/19: 88 kg.   Imaging Review Plain radiographs of the right knee show evidence of soft tissue edema and effusion. No  evidence of fracture.  Assessment/Plan:  Assessment: Acute septic knee, right  Plan: Patient is s/p right knee arthroscopy on 07/29/19, with increased pain and effusion. Synovial cell count revealed WBC of 71,400, with 97% neutrophils. Gram stain shows no organisms. Culture pending. Plan for right knee arthroscopic irrigation and debridement with Dr. Alvan Dame tonight vs tomorrow. Patient to remain NPO. Continue pain management. Dr. Alvan Dame to discuss risks, benefits, and expectations with patient.   Griffith Citron, PA-C Orthopedic Surgery [facility] [place]

## 2019-08-10 ENCOUNTER — Encounter (HOSPITAL_COMMUNITY): Payer: Self-pay | Admitting: Orthopedic Surgery

## 2019-08-10 LAB — SEDIMENTATION RATE: Sed Rate: 17 mm/hr — ABNORMAL HIGH (ref 0–16)

## 2019-08-10 LAB — C-REACTIVE PROTEIN: CRP: 17.8 mg/dL — ABNORMAL HIGH (ref <1.0)

## 2019-08-10 LAB — GLUCOSE, CAPILLARY: Glucose-Capillary: 148 mg/dL — ABNORMAL HIGH (ref 70–99)

## 2019-08-10 MED ORDER — ORITAVANCIN DIPHOSPHATE 400 MG IV SOLR
1200.0000 mg | Freq: Once | INTRAVENOUS | Status: AC
  Administered 2019-08-10: 1200 mg via INTRAVENOUS
  Filled 2019-08-10: qty 120

## 2019-08-10 NOTE — Anesthesia Postprocedure Evaluation (Signed)
Anesthesia Post Note  Patient: Jahmar Mckelvy.  Procedure(s) Performed: arthroscopic incision and drainage snovectomy (Right Knee)     Patient location during evaluation: PACU Anesthesia Type: General Level of consciousness: awake and alert Pain management: pain level controlled Vital Signs Assessment: post-procedure vital signs reviewed and stable Respiratory status: spontaneous breathing, nonlabored ventilation, respiratory function stable and patient connected to nasal cannula oxygen Cardiovascular status: blood pressure returned to baseline and stable Postop Assessment: no apparent nausea or vomiting Anesthetic complications: no    Last Vitals:  Vitals:   08/10/19 1055 08/10/19 1100  BP: (!) 176/100 (!) 176/97  Pulse: 65   Resp: 17   Temp: 36.7 C   SpO2: 100%     Last Pain:  Vitals:   08/10/19 1555  TempSrc:   PainSc: 3                  Shameka Aggarwal

## 2019-08-10 NOTE — TOC Initial Note (Signed)
Transition of Care (TOC) - Initial/Assessment Note    Patient Details  Name: [name] [name]. MRN: [id number] Date of Birth: 12/05/1961  Transition of Care (TOC) CM/SW Contact:    Joaquin Courts, RN Phone Number: 08/10/2019, 10:40 AM  Clinical Narrative:      CM spoke with patient at bedside, [employer] arranged to deliver rolling walker and 3-in-1 to bedside for home use.               Expected Discharge Plan: Home/Self Care     Patient Goals and CMS Choice        Expected Discharge Plan and Services Expected Discharge Plan: Home/Self Care   Discharge Planning Services: CM Consult   Living arrangements for the past 2 months: [place]                 DME Arranged: 3-N-1, Walker rolling DME Agency: [employer] Date DME Agency Contacted: 08/10/19 Time DME Agency Contacted: [id number] Representative spoke with at DME Agency: [place]: NA [place] Agency: NA        Prior Living Arrangements/Services Living arrangements for the past 2 months: [place] Lives with:: Self Patient language and need for interpreter reviewed:: Yes Do you feel safe going back to the place where you live?: Yes      Need for Family Participation in Patient Care: Yes (Comment) Care giver support system in place?: Yes (comment)   Criminal Activity/Legal Involvement Pertinent to Current Situation/Hospitalization: No - Comment as needed  Activities of Daily Living Home Assistive Devices/Equipment: Eyeglasses, CBG Meter, Blood pressure cuff, Walker (specify type) ADL Screening (condition at time of admission) Patient's cognitive ability adequate to safely complete daily activities?: Yes Is the patient deaf or have difficulty hearing?: No Does the patient have difficulty seeing, even when wearing glasses/contacts?: No Does the patient have difficulty concentrating, remembering, or making decisions?: No Patient able to express need for assistance with ADLs?: Yes Does  the patient have difficulty dressing or bathing?: No Independently performs ADLs?: Yes (appropriate for developmental age) Does the patient have difficulty walking or climbing stairs?: Yes Weakness of Legs: Right Weakness of Arms/Hands: None  Permission Sought/Granted                  Emotional Assessment Appearance:: Appears stated age Attitude/Demeanor/Rapport: Engaged Affect (typically observed): Accepting Orientation: : Oriented to Place, Oriented to  Time, Oriented to Situation, Oriented to Self   Psych Involvement: No (comment)  Admission diagnosis:  Pyogenic arthritis of right knee joint, due to unspecified organism [facility]) [M00.9] Patient Active Problem List   Diagnosis Date Noted  . Septic arthritis ([place]) 08/09/2019  . S/P right knee arthroscopy 07/29/2019 08/03/2019  . Acute lateral meniscus tear of right knee   . Acute medial meniscus tear of right knee   . Substance use disorder 03/16/2019  . History of diabetes mellitus 10/10/2016  . Avascular necrosis of hip, left ([place]) 10/04/2016  . Status post left hip replacement 10/04/2016  . Rash and nonspecific skin eruption 08/12/2013  . Cholelithiases 08/04/2013  . Subcutaneous nodule 07/21/2013  . Acute alcoholic pancreatitis 53/97/6734  . Syncope 04/11/2013  . Prolonged Q-T interval on ECG 04/11/2013  . Cocaine abuse ([place]) 03/13/2013    Class: Chronic  . At risk for adverse drug event 02/14/2013  . DDD (degenerative disc disease), lumbar 02/01/2013  . Dyspepsia 12/01/2012  . BPH (benign prostatic hyperplasia) 10/07/2012  . Allergic rhinitis 10/03/2012  . Transaminitis 10/02/2012  . Chronic pain  syndrome 10/02/2012  . Essential hypertension, benign 07/01/2012  . Tobacco user 07/01/2012  . Hepatic steatosis 03/06/2012  . ED (erectile dysfunction) 03/06/2012  . Pseudocyst of pancreas 02/28/2012  . Alcohol abuse 02/25/2012  . GERD (gastroesophageal reflux disease) 02/23/2012  . Bipolar 1 disorder ([place]) 02/23/2012   . Hypertriglyceridemia 12/05/2011   PCP:  Denyce Keoki, FNP Pharmacy:   [facility], [place] [address] [place] [address] Phone: [phone] Fax: [phone]     Social Determinants of Health (SDOH) Interventions    Readmission Risk Interventions No flowsheet data found.

## 2019-08-10 NOTE — Op Note (Signed)
NAMEVoshon, [name] MEDICAL RECORD [id number] ACCOUNT [id number] DATE OF BIRTH:01-06-1961 FACILITY: [facility] LOCATION: [facility] PHYSICIAN:Clif Serio Marian Sorrow, MD  OPERATIVE REPORT  DATE OF PROCEDURE:  08/09/2019  PREOPERATIVE DIAGNOSIS:  Septic right knee following arthroscopic surgery 07/29/2019.  POSTOPERATIVE DIAGNOSIS:  Septic right knee following arthroscopic surgery 07/29/2019.  PROCEDURE:  Right knee arthroscopic irrigation and debridement with complete synovectomy.  SURGEON:  Paralee Cancel, MD  ASSISTANT:  Surgical team.  ANESTHESIA:  General.  BLOOD LOSS:  Less than 20 mL.  DRAINS:  One medium Hemovac drain.  INDICATIONS:  The patient is a 58 year old male who underwent arthroscopic surgery on 08/06.  In the postoperative period, he reports drainage.  He presented once the drainage stopped with increasing pain, swelling in the right knee with fevers.  He was  seen in the [facility] Emergency Room with aspiration revealing purulence.  There were 74,000 white cells.  His primary surgeon, Dr. Arther Abbott, was out of town and unavailable.  He was subsequently transferred to [facility] for acute management  of the infection.  Indications for procedure were septic right knee, the risk of recurrent infection were discussed.  Postoperative course was reviewed including infectious disease consultation.  Recommendations for antibiotic use given his medical  comorbidities and history of drug use.  Consent was obtained for infection control and management of pain.  PROCEDURE IN DETAIL:  The patient was brought to the operative theater.  Once adequate anesthesia, preoperative antibiotics were initially held until the portal sites were created.  He was given Ancef and vancomycin. A timeout was performed identifying  the patient, the planned procedure, and extremity.  The previous inferior, medial, and lateral portals were utilized, as well as a superior lateral portal.  The portals were  created and at least 60 mL of purulence were removed from his knee and sent in  the culture to pathology.  I then inserted the camera into the inferior lateral portal as well as an outflow cannula in the superior lateral aspect of the joint.  With a 3.5 shaver, performed a synovectomy as we irrigated his knee with 9 L of fluid.   Upon completion of the procedure, I did examine his knee.  He did have some relatively normal patellofemoral compartment.  He had had just a recent history of this medial meniscectomy.  A few parts of the meniscus were reshaped down to stabilize, but not  a portion of the procedure  other than the synovectomy.  He was noted to have no significant degenerative changes in the medial and lateral compartment.  The lateral compartment had evidence of meniscal pathology as well addressed.  Upon completion, the  instrumentation was removed from the knee.  The inferomedial and lateral portals were reapproximated using 4-0 nylon.  I did insert a medium Hemovac drain through the outflow cannula superior lateral aspect.  I did place a single stitch in the superior  lateral portal that did not suture the drain in.  The intent will be to keep the drain in overnight and remove with some intentional drainage into the dressing.  His knee was then cleaned, dried, and dressed sterilely with Xeroform over the portal sites  and a bulky dressing and ice pack applied.  He was brought to the recovery room in stable condition.  Postoperatively, we will have him admitted overnight for antibiotic use in consultation with infectious disease recommendations.  Medications based on  cultures obtained and with initial aspiration and intraoperatively.  LN/NUANCE  D:08/09/2019 T:08/10/2019  JOB:[id number]

## 2019-08-10 NOTE — Progress Notes (Signed)
   Subjective: 1 Day Post-Op Procedure(s) (LRB): arthroscopic incision and drainage snovectomy (Right) Patient reports pain as moderate.   Patient seen in rounds by Dr. Alvan Dame. Patient is well, and has had no acute complaints or problems other than pain in the right knee. Hemovac drain removed.   Objective: Vital signs in last 24 hours: Temp:  [97.7 F (36.5 C)-99.1 F (37.3 C)] 98.1 F (36.7 C) (08/18 1055) Pulse Rate:  [65-91] 65 (08/18 1055) Resp:  [12-20] 17 (08/18 1055) BP: (120-176)/(86-112) 176/97 (08/18 1100) SpO2:  [89 %-100 %] 100 % (08/18 1055) Weight:  [87 kg] 87 kg (08/17 1839)  Intake/Output from previous day:  Intake/Output Summary (Last 24 hours) at 08/10/2019 1152 Last data filed at 08/10/2019 1100 Gross per 24 hour  Intake 3879.57 ml  Output 1705 ml  Net 2174.57 ml     Intake/Output this shift: Total I/O In: 901.8 [P.O.:480; I.V.:321.8; IV Piggyback:100] Out: 400 [Urine:400]  Labs: Recent Labs    08/09/19 0637  HGB 13.5   Recent Labs    08/09/19 0637  WBC 7.8  RBC 4.29  HCT 40.5  PLT 337   Recent Labs    08/09/19 0637  NA 135  K 3.6  CL 102  CO2 21*  BUN 12  CREATININE 0.92  GLUCOSE 132*  CALCIUM 9.3   No results for input(s): LABPT, INR in the last 72 hours.  Exam: General - Patient is Alert and Oriented Extremity - Neurologically intact Sensation intact distally Intact pulses distally Dorsiflexion/Plantar flexion intact Dressing - dressing C/D/I Motor Function - intact, moving foot and toes well on exam.   Past Medical History:  Diagnosis Date  . Acid reflux   . Alcohol abuse    6-8 cans of beer daily; hx of incarceration for DWI  . Arthritis   . Bipolar disorder ([place])   . Bronchitis   . Chronic back pain   . Chronic neck pain   . Chronic pain   . Diabetes mellitus without complication ([place])   . Fracture of lower leg   . Gout   . Hyperlipidemia   . Hypertension   . Left arm pain    chronic  . Migraine   .  Pancreatitis    March 2013  . Pseudocyst of pancreas 02/28/2012  . Substance abuse ([place])     Assessment/Plan: 1 Day Post-Op Procedure(s) (LRB): arthroscopic incision and drainage snovectomy (Right) Active Problems:   Septic arthritis ([place])  Estimated body mass index is 27.52 kg/m as calculated from the following:   Height as of this encounter: 5\' 10"  (1.778 m).   Weight as of this encounter: 87 kg. Advance diet Up with therapy D/C IV fluids  DVT Prophylaxis - Foot Pumps Weight bearing as tolerated. D/C O2 and pulse ox and try on room air. Hemovac pulled without difficulty.  Plan is to go Home after hospital stay. Plan for discharge today following recommendations from ID. Hemovac drain removed today without difficulty. ACE wrap left in place. Patient instructed that he may remove this tomorrow and dress the incisions with bandaids. Patient to schedule follow up with Dr. Aline Brochure in the office  Infectious disease to see patient today, and we will appreciate recommendations for home antibiotic regimen.  Griffith Citron, PA-C Orthopedic Surgery 08/10/2019, 11:52 AM

## 2019-08-10 NOTE — Consult Note (Addendum)
[facility] for Infectious Disease  Total days of antibiotics 2               Reason for Consult: septic arthritis   Referring Physician: olin  Active Problems:   Septic arthritis [facility])    HPI: [name] [name]. is a 58 y.o. male  With pmhx with bipolar and ETOH abuse, DM, admitted for probable septic arthritis of right knee s/p washout. He recently underwent a right knee arthroscopy with medial and lateral menisectomy by Dr. Aline Brochure on 07/29/19 with residual effusion and serosanguinous drainage from lateral port on 8/12.   Patient then presented to [facility] ED on 08/09/19 due to worsening pain. X-ray of right knee  Showed  mild soft tissue swelling and large knee joint effusion without associated fracture. He underwent aspiration of knee joint which drew out 72mL of cloudy fluid- cell count of 71K with 91%N c/w septic arthritis. Gram stain showing GPC but cultures pending. He was transferred to [facility] for arthroscopic I x D by Dr  Alvan Dame. He denies fevers, chills, nightsweats.    Past Medical History:  Diagnosis Date  . Acid reflux   . Alcohol abuse    6-8 cans of beer daily; hx of incarceration for DWI  . Arthritis   . Bipolar disorder ([place])   . Bronchitis   . Chronic back pain   . Chronic neck pain   . Chronic pain   . Diabetes mellitus without complication ([place])   . Fracture of lower leg   . Gout   . Hyperlipidemia   . Hypertension   . Left arm pain    chronic  . Migraine   . Pancreatitis    March 2013  . Pseudocyst of pancreas 02/28/2012  . Substance abuse ([place])     Allergies:  Allergies  Allergen Reactions  . Penicillins Itching and Other (See Comments)    Has patient had a PCN reaction causing immediate rash, facial/tongue/throat swelling, SOB or lightheadedness with hypotension: No Has patient had a PCN reaction causing severe rash involving mucus membranes or skin necrosis: No Has patient had a PCN reaction that required hospitalization No Has patient had a PCN  reaction occurring within the last 10 years: No If all of the above answers are "NO", then may proceed with Cephalosporin use.     MEDICATIONS: . acetaminophen  1,000 mg Oral Q6H  . amLODipine  5 mg Oral Daily  . cholecalciferol  1,000 Units Oral Daily  . citalopram  20 mg Oral Daily  . docusate sodium  100 mg Oral BID  . guaiFENesin  400 mg Oral Daily  . lisinopril  10 mg Oral Daily  . loratadine  10 mg Oral QPM  . metFORMIN  850 mg Oral Q breakfast  . multivitamin with minerals  1 tablet Oral Daily  . nicotine  21 mg Transdermal Once  . pravastatin  40 mg Oral q1800  . tamsulosin  0.4 mg Oral Daily    Social History   Tobacco Use  . Smoking status: Current Every Day Smoker    Packs/day: 0.50    Years: 25.00    Pack years: 12.50    Types: Cigarettes  . Smokeless tobacco: Former Systems developer    Types: Chew  Substance Use Topics  . Alcohol use: Yes    Comment: once a week  . Drug use: Not Currently    Types: Cocaine, Marijuana    Comment: 06/26/19 - Cocaine    Family History  Problem Relation Age of Onset  . Diabetes Father   . Anesthesia problems Neg Hx   . Hypotension Neg Hx   . Malignant hyperthermia Neg Hx   . Pseudochol deficiency Neg Hx   . Colon cancer Neg Hx   . Heart disease Neg Hx   . Stroke Neg Hx   . Cancer Neg Hx      Review of Systems  Constitutional: Negative for fever, chills, diaphoresis, activity change, appetite change, fatigue and unexpected weight change.  HENT: Negative for congestion, sore throat, rhinorrhea, sneezing, trouble swallowing and sinus pressure.  Eyes: Negative for photophobia and visual disturbance.  Respiratory: Negative for cough, chest tightness, shortness of breath, wheezing and stridor.  Cardiovascular: Negative for chest pain, palpitations and leg swelling.  Gastrointestinal: Negative for nausea, vomiting, abdominal pain, diarrhea, constipation, blood in stool, abdominal distention and anal bleeding.  Genitourinary: Negative  for dysuria, hematuria, flank pain and difficulty urinating.  Musculoskeletal: +right knee pain. Negative for myalgias, back pain, joint swelling, arthralgias and gait problem.  Skin: Negative for color change, pallor, rash and wound.  Neurological: Negative for dizziness, tremors, weakness and light-headedness.  Hematological: Negative for adenopathy. Does not bruise/bleed easily.  Psychiatric/Behavioral: Negative for behavioral problems, confusion, sleep disturbance, dysphoric mood, decreased concentration and agitation.     OBJECTIVE: Temp:  [97.7 F (36.5 C)-99.1 F (37.3 C)] 98.1 F (36.7 C) (08/18 1055) Pulse Rate:  [65-91] 65 (08/18 1055) Resp:  [12-20] 17 (08/18 1055) BP: (131-176)/(86-105) 176/97 (08/18 1100) SpO2:  [95 %-100 %] 100 % (08/18 1055) Weight:  [87 kg] 87 kg (08/17 1839) Physical Exam  Constitutional: He is oriented to person, place, and time. He appears well-developed and well-nourished. No distress.  HENT:  Mouth/Throat: Oropharynx is clear and moist. No oropharyngeal exudate.  Cardiovascular: Normal rate, regular rhythm and normal heart sounds. Exam reveals no gallop and no friction rub.  No murmur heard.  Pulmonary/Chest: Effort normal and breath sounds normal. No respiratory distress. He has no wheezes.  Abdominal: Soft. Bowel sounds are normal. He exhibits no distension. There is no tenderness.  Ext: right knee -wrapped with ice Neurological: He is alert and oriented to person, place, and time.  Skin: Skin is warm and dry. No rash noted. No erythema.  Psychiatric: He has a normal mood and affect. His behavior is normal.     LABS: Results for orders placed or performed during the hospital encounter of 08/09/19 (from the past 48 hour(s))  CBC with Differential/Platelet     Status: None   Collection Time: 08/09/19  6:37 AM  Result Value Ref Range   WBC 7.8 4.0 - 10.5 K/uL   RBC 4.29 4.22 - 5.81 MIL/uL   Hemoglobin 13.5 13.0 - 17.0 g/dL   HCT 40.5 39.0  - 52.0 %   MCV 94.4 80.0 - 100.0 fL   MCH 31.5 26.0 - 34.0 pg   MCHC 33.3 30.0 - 36.0 g/dL   RDW 14.5 11.5 - 15.5 %   Platelets 337 150 - 400 K/uL   nRBC 0.0 0.0 - 0.2 %   Neutrophils Relative % 66 %   Neutro Abs 5.2 1.7 - 7.7 K/uL   Lymphocytes Relative 23 %   Lymphs Abs 1.8 0.7 - 4.0 K/uL   Monocytes Relative 8 %   Monocytes Absolute 0.6 0.1 - 1.0 K/uL   Eosinophils Relative 2 %   Eosinophils Absolute 0.2 0.0 - 0.5 K/uL   Basophils Relative 1 %   Basophils Absolute 0.1 0.0 -  0.1 K/uL   Immature Granulocytes 0 %   Abs Immature Granulocytes 0.02 0.00 - 0.07 K/uL    Comment: Performed at [facility], [address] [place], [place] [address]  Basic metabolic panel     Status: Abnormal   Collection Time: 08/09/19  6:37 AM  Result Value Ref Range   Sodium 135 135 - 145 mmol/L   Potassium 3.6 3.5 - 5.1 mmol/L   Chloride 102 98 - 111 mmol/L   CO2 21 (L) 22 - 32 mmol/L   Glucose, Bld 132 (H) 70 - 99 mg/dL   BUN 12 6 - 20 mg/dL   Creatinine, Ser 0.92 0.61 - 1.24 mg/dL   Calcium 9.3 8.9 - 10.3 mg/dL   GFR calc non Af Amer >60 >60 mL/min   GFR calc Af Amer >60 >60 mL/min   Anion gap 12 5 - 15    Comment: Performed at [facility], [address] [place], [place] [address]  Sedimentation rate     Status: None   Collection Time: 08/09/19  6:37 AM  Result Value Ref Range   Sed Rate 0 0 - 16 mm/hr    Comment: Performed at [facility], [address], [place], [place] [address]  C-reactive protein     Status: None   Collection Time: 08/09/19  6:37 AM  Result Value Ref Range   CRP 0.9 <1.0 mg/dL    Comment: Performed at [facility], [address] [place], [place] [address]  Ethanol     Status: Abnormal   Collection Time: 08/09/19  6:37 AM  Result Value Ref Range   Alcohol, Ethyl (B) 10 (H) <10 mg/dL    Comment: (NOTE) Lowest detectable limit for serum alcohol is 10 mg/dL. For medical purposes only. Performed at [facility], [address] [place], [place] [address]    Synovial cell count + diff, w/ crystals     Status: Abnormal   Collection Time: 08/09/19  6:50 AM  Result Value Ref Range   Color, Synovial YELLOW YELLOW   Appearance-Synovial CLOUDY (A) CLEAR   Crystals, Fluid NO CRYSTALS SEEN    WBC, Synovial 71,400 (H) 0 - 200 /cu mm   Neutrophil, Synovial 97 (H) 0 - 25 %   Lymphocytes-Synovial Fld 0 0 - 20 %   Monocyte-Macrophage-Synovial Fluid 3 (L) 50 - 90 %   Eosinophils-Synovial 0 0 - 1 %   Other Cells-SYN 0     Comment: Performed at [facility], [address] [place], [place] [address]  Gonococcus culture     Status: None (Preliminary result)   Collection Time: 08/09/19  6:50 AM   Specimen: KNEE; Synovial Fluid  Result Value Ref Range   Specimen Description      KNEE RIGHT Performed at [facility], [address] [place], [place] [address]    Special Requests      Normal Performed at [facility], [address], [place],  [address]    Culture      NO GROWTH < 24 HOURS Performed at [facility], [place] [address] [place],  [address]    Report Status PENDING   Culture, body fluid-bottle     Status: None (Preliminary result)   Collection Time: 08/09/19  6:50 AM   Specimen: Synovium  Result Value Ref Range   Specimen Description SYNOVIAL 10CC    Special Requests NONE    Gram Stain      AEROBIC BOTTLE ONLY GRAM POSITIVE COCCI Gram Stain Report  Called to,Read Back By and Verified With: T ROWE,RN @2048  08/09/19 MKELLY    Culture      NO GROWTH < 24 HOURS Performed at [facility], [address], [place], [place] [address]    Report Status PENDING   Blood culture (routine x 2)     Status: None (Preliminary result)   Collection Time: 08/09/19  6:51 AM   Specimen: BLOOD  Result Value Ref Range   Specimen Description BLOOD LEFT ARM    Special Requests      BOTTLES DRAWN AEROBIC AND ANAEROBIC Blood Culture adequate volume   Culture      NO GROWTH 1 DAY Performed at [facility], [address],  [place], [place] [address]    Report Status PENDING   Gram stain     Status: None   Collection Time: 08/09/19  7:38 AM   Specimen: KNEE; Synovial Fluid  Result Value Ref Range   Specimen Description KNEE RIGHT    Special Requests Normal    Gram Stain      NO ORGANISMS SEEN WBC PRESENT,BOTH PMN AND MONONUCLEAR Performed at [facility], [address] [place],  [address]    Report Status 08/09/2019 FINAL   SARS Coronavirus 2 [facility] order, Performed in [facility] hospital lab) Nasopharyngeal Nasopharyngeal Swab     Status: None   Collection Time: 08/09/19  9:48 AM   Specimen: Nasopharyngeal Swab  Result Value Ref Range   SARS Coronavirus 2 NEGATIVE NEGATIVE    Comment: (NOTE) If result is NEGATIVE SARS-CoV-2 target nucleic acids are NOT DETECTED. The SARS-CoV-2 RNA is generally detectable in upper and lower  respiratory specimens during the acute phase of infection. The lowest  concentration of SARS-CoV-2 viral copies this assay can detect is 250  copies / mL. A negative result does not preclude SARS-CoV-2 infection  and should not be used as the sole basis for treatment or other  patient management decisions.  A negative result may occur with  improper specimen collection / handling, submission of specimen other  than nasopharyngeal swab, presence of viral mutation(s) within the  areas targeted by this assay, and inadequate number of viral copies  (<250 copies / mL). A negative result must be combined with clinical  observations, patient history, and epidemiological information. If result is POSITIVE SARS-CoV-2 target nucleic acids are DETECTED. The SARS-CoV-2 RNA is generally detectable in upper and lower  respiratory specimens dur ing the acute phase of infection.  Positive  results are indicative of active infection with SARS-CoV-2.  Clinical  correlation with patient history and other diagnostic information is  necessary to determine patient infection status.  Positive  results do  not rule out bacterial infection or co-infection with other viruses. If result is PRESUMPTIVE POSTIVE SARS-CoV-2 nucleic acids MAY BE PRESENT.   A presumptive positive result was obtained on the submitted specimen  and confirmed on repeat testing.  While 2019 novel coronavirus  (SARS-CoV-2) nucleic acids may be present in the submitted sample  additional confirmatory testing may be necessary for epidemiological  and / or clinical management purposes  to differentiate between  SARS-CoV-2 and other Sarbecovirus currently known to infect humans.  If clinically indicated additional testing with an alternate test  methodology [phone]) is advised. The SARS-CoV-2 RNA is generally  detectable in upper and lower respiratory sp ecimens during the acute  phase of infection. The expected result is Negative. Fact Sheet for Patients:  StrictlyIdeas.no Fact Sheet for Healthcare Providers: BankingDealers.co.za This test is  not yet approved or cleared by the [place] and has been authorized for detection and/or diagnosis of SARS-CoV-2 by FDA under an Emergency Use Authorization (EUA).  This EUA will remain in effect (meaning this test can be used) for the duration of the COVID-19 declaration under Section 564(b)(1) of the Act, 21 U.S.C. section 360bbb-3(b)(1), unless the authorization is terminated or revoked sooner. Performed at [facility], [address], [place], [place] [address]   CBG monitoring, ED     Status: Abnormal   Collection Time: 08/09/19  3:51 PM  Result Value Ref Range   Glucose-Capillary 128 (H) 70 - 99 mg/dL  Surgical pcr screen     Status: None   Collection Time: 08/09/19  6:53 PM   Specimen: Nasal Mucosa; Nasal Swab  Result Value Ref Range   MRSA, PCR NEGATIVE NEGATIVE   Staphylococcus aureus NEGATIVE NEGATIVE    Comment: (NOTE) The Xpert SA Assay (FDA approved for NASAL specimens in patients 87 years of  age and older), is one component of a comprehensive surveillance program. It is not intended to diagnose infection nor to guide or monitor treatment. Performed at [facility], [place] [address] [place], [place] [address]   Aerobic/Anaerobic Culture (surgical/deep wound)     Status: None (Preliminary result)   Collection Time: 08/09/19  8:48 PM   Specimen: Abscess  Result Value Ref Range   Specimen Description      ABSCESS Performed at [facility] [address] [place], [place] [address]    Special Requests ARTHROSCOPY KNEE/WASH OUT    Gram Stain      ABUNDANT WBC PRESENT, PREDOMINANTLY PMN NO ORGANISMS SEEN Performed at [facility], [place] [address] [place], [place] [address]    Culture PENDING    Report Status PENDING   Glucose, capillary     Status: Abnormal   Collection Time: 08/09/19 10:01 PM  Result Value Ref Range   Glucose-Capillary 110 (H) 70 - 99 mg/dL  Glucose, capillary     Status: Abnormal   Collection Time: 08/10/19  7:24 AM  Result Value Ref Range   Glucose-Capillary 148 (H) 70 - 99 mg/dL    MICRO: 8/17 synovial fluid GPC IMAGING: Dg Knee Complete 4 Views Right  Result Date: 08/09/2019 CLINICAL DATA:  Right knee arthroscopic surgery on 07/29/2019 with worsening knee pain. EXAM: RIGHT KNEE - COMPLETE 4+ VIEW COMPARISON:  Right knee radiographs-05/27/2019 FINDINGS: Mild soft tissue swelling about the knee with large knee joint effusion. No evidence of lipohemarthrosis. No fracture or dislocation. No radiopaque foreign body. Mild tricompartmental degenerative change of the knee, likely worse within the lateral compartment with joint space loss, subchondral sclerosis osteophytosis. Minimal spurring of the tibial spines. No evidence of chondrocalcinosis. IMPRESSION: Mild soft tissue swelling about the knee and large knee joint effusion without associated fracture, nonspecific though potentially postoperative in etiology.  Electronically Signed   By: Sandi Mariscal M.D.   On: 08/09/2019 07:25    Assessment/Plan:  58yo M with hx of right knee instrumentation complicated by secondary bacterial infection likely from sinus tract/poor healing from lateral port.  Currently on cefazolin  - will give a dose of oritavancin to provide gram positive coverage which will be depot of roughly 10-14d - will give patient oral abtx -recommend  Doxycycline 100mg   BID x 3 wk which he can start next week on 8/27  - check sed rate and crp - will follow up on culture results  Health  maintenance - will check sed rate and crp  Anticipate d/c tomorrow Will have him follow up in the ID clinic in 3 wk

## 2019-08-10 NOTE — Discharge Instructions (Signed)
INSTRUCTIONS AFTER KNEE SURGERY  o Remove items at home which could result in a fall. This includes throw rugs or furniture in walking pathways o ICE to the affected joint every three hours while awake for 30 minutes at a time, for at least the first 3-5 days, and then as needed for pain and swelling.  Continue to use ice for pain and swelling. You may notice swelling that will progress down to the foot and ankle.  This is normal after surgery.  Elevate your leg when you are not up walking on it.   o Continue to use the breathing machine you got in the hospital (incentive spirometer) which will help keep your temperature down.  It is common for your temperature to cycle up and down following surgery, especially at night when you are not up moving around and exerting yourself.  The breathing machine keeps your lungs expanded and your temperature down.   DIET:  As you were doing prior to hospitalization, we recommend a well-balanced diet.  ACTIVITY  o Increase activity slowly as tolerated, but follow the weight bearing instructions below.   o No driving for 6 weeks or until further direction given by your physician.  You cannot drive while taking narcotics.  o No lifting or carrying greater than 10 lbs. until further directed by your surgeon. o Avoid periods of inactivity such as sitting longer than an hour when not asleep. This helps prevent blood clots.  o You may return to work once you are authorized by your doctor.     WEIGHT BEARING   Weight bearing as tolerated with assist device (walker, cane, etc) as directed, use it as long as suggested by your surgeon or therapist, typically at least 4-6 weeks.  CONSTIPATION  Constipation is defined medically as fewer than three stools per week and severe constipation as less than one stool per week.  Even if you have a regular bowel pattern at home, your normal regimen is likely to be disrupted due to multiple reasons following surgery.   Combination of anesthesia, postoperative narcotics, change in appetite and fluid intake all can affect your bowels.   YOU MUST use at least one of the following options; they are listed in order of increasing strength to get the job done.  They are all available over the counter, and you may need to use some, POSSIBLY even all of these options:    Drink plenty of fluids (prune juice may be helpful) and high fiber foods Colace 100 mg by mouth twice a day  Senokot for constipation as directed and as needed Dulcolax (bisacodyl), take with full glass of water  Miralax (polyethylene glycol) once or twice a day as needed.  If you have tried all these things and are unable to have a bowel movement in the first 3-4 days after surgery call either your surgeon or your primary doctor.    If you experience loose stools or diarrhea, hold the medications until you stool forms back up.  If your symptoms do not get better within 1 week or if they get worse, check with your doctor.  If you experience "the worst abdominal pain ever" or develop nausea or vomiting, please contact the office immediately for further recommendations for treatment.   ITCHING:  If you experience itching with your medications, try taking only a single pain pill, or even half a pain pill at a time.  You can also use Benadryl over the counter for itching or also to  help with sleep.   TED HOSE STOCKINGS:  Use stockings on both legs until for at least 2 weeks or as directed by physician office. They may be removed at night for sleeping.  MEDICATIONS:  See your medication summary on the After Visit Summary that nursing will review with you.  You may have some home medications which will be placed on hold until you complete the course of blood thinner medication.  It is important for you to complete the blood thinner medication as prescribed.  PRECAUTIONS:  If you experience chest pain or shortness of breath - call 911 immediately for  transfer to the hospital emergency department.   If you develop a fever greater that 101 F, purulent drainage from wound, increased redness or drainage from wound, foul odor from the wound/dressing, or calf pain - CONTACT YOUR SURGEON.                                                   FOLLOW-UP APPOINTMENTS:  If you do not already have a post-op appointment, please call the office for an appointment to be seen by your surgeon.  Guidelines for how soon to be seen are listed in your After Visit Summary, but are typically between 1-4 weeks after surgery.  OTHER INSTRUCTIONS:   MAKE SURE YOU:   Understand these instructions.   Get help right away if you are not doing well or get worse.    Thank you for letting us be a part of your medical care team.  It is a privilege we respect greatly.  We hope these instructions will help you stay on track for a fast and full recovery!

## 2019-08-10 NOTE — Care Management Obs Status (Signed)
[facility] NOTIFICATION   Patient Details  Name: [name] [name]. MRN: [id number] Date of Birth: 1961-11-03   Medicare Observation Status Notification Given:  Yes    Joaquin Courts, RN 08/10/2019, 9:35 AM

## 2019-08-11 ENCOUNTER — Telehealth: Payer: Self-pay

## 2019-08-11 DIAGNOSIS — Z96642 Presence of left artificial hip joint: Secondary | ICD-10-CM | POA: Diagnosis present

## 2019-08-11 DIAGNOSIS — I10 Essential (primary) hypertension: Secondary | ICD-10-CM | POA: Diagnosis present

## 2019-08-11 DIAGNOSIS — G43909 Migraine, unspecified, not intractable, without status migrainosus: Secondary | ICD-10-CM | POA: Diagnosis present

## 2019-08-11 DIAGNOSIS — G894 Chronic pain syndrome: Secondary | ICD-10-CM | POA: Diagnosis present

## 2019-08-11 DIAGNOSIS — E785 Hyperlipidemia, unspecified: Secondary | ICD-10-CM | POA: Diagnosis present

## 2019-08-11 DIAGNOSIS — M5136 Other intervertebral disc degeneration, lumbar region: Secondary | ICD-10-CM | POA: Diagnosis present

## 2019-08-11 DIAGNOSIS — K219 Gastro-esophageal reflux disease without esophagitis: Secondary | ICD-10-CM | POA: Diagnosis present

## 2019-08-11 DIAGNOSIS — Z7984 Long term (current) use of oral hypoglycemic drugs: Secondary | ICD-10-CM | POA: Diagnosis not present

## 2019-08-11 DIAGNOSIS — Z20828 Contact with and (suspected) exposure to other viral communicable diseases: Secondary | ICD-10-CM | POA: Diagnosis present

## 2019-08-11 DIAGNOSIS — F319 Bipolar disorder, unspecified: Secondary | ICD-10-CM | POA: Diagnosis present

## 2019-08-11 DIAGNOSIS — T8140XA Infection following a procedure, unspecified, initial encounter: Secondary | ICD-10-CM | POA: Diagnosis present

## 2019-08-11 DIAGNOSIS — E781 Pure hyperglyceridemia: Secondary | ICD-10-CM | POA: Diagnosis present

## 2019-08-11 DIAGNOSIS — Z7951 Long term (current) use of inhaled steroids: Secondary | ICD-10-CM | POA: Diagnosis not present

## 2019-08-11 DIAGNOSIS — E119 Type 2 diabetes mellitus without complications: Secondary | ICD-10-CM | POA: Diagnosis present

## 2019-08-11 DIAGNOSIS — F17210 Nicotine dependence, cigarettes, uncomplicated: Secondary | ICD-10-CM | POA: Diagnosis present

## 2019-08-11 DIAGNOSIS — Z833 Family history of diabetes mellitus: Secondary | ICD-10-CM | POA: Diagnosis not present

## 2019-08-11 DIAGNOSIS — M009 Pyogenic arthritis, unspecified: Secondary | ICD-10-CM | POA: Diagnosis present

## 2019-08-11 DIAGNOSIS — Z880 Allergy status to penicillin: Secondary | ICD-10-CM | POA: Diagnosis not present

## 2019-08-11 LAB — HIV ANTIBODY (ROUTINE TESTING W REFLEX): HIV Screen 4th Generation wRfx: NONREACTIVE

## 2019-08-11 LAB — CREATININE, SERUM
Creatinine, Ser: 0.8 mg/dL (ref 0.61–1.24)
GFR calc Af Amer: >60 mL/min (ref >60)
GFR calc non Af Amer: >60 mL/min (ref >60)

## 2019-08-11 LAB — HEPATITIS C ANTIBODY: HCV Ab: 0.1 s/co ratio (ref 0.0–0.9)

## 2019-08-11 LAB — GONOCOCCUS CULTURE: Special Requests: NORMAL

## 2019-08-11 LAB — GLUCOSE, CAPILLARY: Glucose-Capillary: 137 mg/dL — ABNORMAL HIGH (ref 70–99)

## 2019-08-11 MED ORDER — DOXYCYCLINE HYCLATE 100 MG PO CAPS: 100.0000 mg | ORAL_CAPSULE | Freq: Two times a day (BID) | ORAL | 0 refills | Status: DC

## 2019-08-11 MED ORDER — DOCUSATE SODIUM 100 MG PO CAPS: 100.0000 mg | ORAL_CAPSULE | Freq: Two times a day (BID) | ORAL | 0 refills | Status: DC

## 2019-08-11 MED ORDER — POLYETHYLENE GLYCOL 3350 17 G PO PACK: 17.0000 g | PACK | Freq: Every day | ORAL | 0 refills | Status: DC | PRN

## 2019-08-11 MED ORDER — OXYCODONE HCL 5 MG PO TABS: 5.0000 mg | ORAL_TABLET | Freq: Four times a day (QID) | ORAL | 0 refills | Status: DC | PRN

## 2019-08-11 NOTE — Progress Notes (Addendum)
     Subjective: 2 Days Post-Op Procedure(s) (LRB): arthroscopic incision and drainage snovectomy (Right)   Patient reports pain as mild, pain controlled. No events throughout the night.  Dressing changed. Discussed ID's plan of antibiotics moving forward.  Plan on d/c home today.   Objective:   VITALS:   Vitals:   08/11/19 0006 08/11/19 0458  BP: 131/88 (!) 137/96  Pulse: 72 63  Resp: 18 18  Temp: 98.2 F (36.8 C) 98.4 F (36.9 C)  SpO2: 96% 99%    Dorsiflexion/Plantar flexion intact Incision: scant drainage No cellulitis present Compartment soft  LABS Recent Labs    08/09/19 0637  HGB 13.5  HCT 40.5  WBC 7.8  PLT 337    Recent Labs    08/09/19 0637 08/11/19 0520  NA 135  --   K 3.6  --   BUN 12  --   CREATININE 0.92 0.80  GLUCOSE 132*  --      Assessment/Plan: 2 Days Post-Op Procedure(s) (LRB): arthroscopic incision and drainage snovectomy (Right) Dressing changed Instructed patient on changing dressing Up with therapy Discharge home Follow up in 1 week with Dr. Arther Abbott    ID Recs - will give a dose of oritavancin to provide gram positive coverage which will be depot of roughly 10-14d - will give patient oral abtx -recommend  Doxycycline 100mg   BID x 3 wk which he can start next week on 8/27  - check sed rate and crp - will follow up on culture results - Anticipate d/c tomorrow - Will have him follow up in the ID clinic in 3 wk    [place]. Levina Boyack   PAC  08/11/2019, 9:20 AM

## 2019-08-11 NOTE — Progress Notes (Signed)
Patient did not receive 3 n 1 but did not want to wait. Equipment will be sent to patient's home. Will follow up with case management tomorrow.

## 2019-08-11 NOTE — TOC Transition Note (Addendum)
Transition of Care [facility]) - CM/SW Discharge Note   Patient Details  Name: [name] [name]. MRN: [id number] Date of Birth: 1961/02/11  [facility] CM/SW Contact:  Leeroy Cha, RN Phone Number: 08/11/2019, 10:40 AM   Clinical Narrative:    Patient discharged to home with self care. pATIOENT CALLED DID NOT GET DMER ORDERED ON [id number]-ORDER WAS SENT TO [facility].  ORDER RESENT.        Patient Goals and CMS Choice        Discharge Placement                       Discharge Plan and Services   Discharge Planning Services: CM Consult            DME Arranged: 3-N-1, Walker rolling DME Agency: [employer] Date DME Agency Contacted: 08/10/19 Time DME Agency Contacted: [id number] Representative spoke with at DME Agency: [place]: NA [place] Agency: NA        Social Determinants of Health ([place]) Interventions     Readmission Risk Interventions No flowsheet data found.

## 2019-08-11 NOTE — Telephone Encounter (Signed)
Left patient a voice mail to call back to schedule a hospital follow up with in 3 weeks for Dr. Baxter Flattery

## 2019-08-12 DIAGNOSIS — M25561 Pain in right knee: Secondary | ICD-10-CM | POA: Diagnosis not present

## 2019-08-12 LAB — CULTURE, BODY FLUID W GRAM STAIN -BOTTLE

## 2019-08-14 LAB — CULTURE, BLOOD (ROUTINE X 2)
Culture: NO GROWTH
Special Requests: ADEQUATE

## 2019-08-15 LAB — AEROBIC/ANAEROBIC CULTURE W GRAM STAIN (SURGICAL/DEEP WOUND)

## 2019-08-17 NOTE — Telephone Encounter (Signed)
Patient returned to confirm hospital follow up appointment. Provided patient with new appointment date/time/location. Also sent link for MyChart signup per patient request.  [name] [name]

## 2019-08-18 ENCOUNTER — Other Ambulatory Visit: Payer: Self-pay

## 2019-08-18 ENCOUNTER — Ambulatory Visit (INDEPENDENT_AMBULATORY_CARE_PROVIDER_SITE_OTHER): Payer: Medicare Other | Admitting: Orthopedic Surgery

## 2019-08-18 VITALS — BP 130/81 | HR 91 | Temp 96.6°F | Ht 70.0 in | Wt 188.0 lb

## 2019-08-18 DIAGNOSIS — M00869 Arthritis due to other bacteria, unspecified knee: Secondary | ICD-10-CM

## 2019-08-18 DIAGNOSIS — Z9889 Other specified postprocedural states: Secondary | ICD-10-CM

## 2019-08-18 MED ORDER — DOXYCYCLINE HYCLATE 100 MG PO CAPS: 100.0000 mg | ORAL_CAPSULE | Freq: Two times a day (BID) | ORAL | 0 refills | Status: AC

## 2019-08-18 MED ORDER — OXYCODONE HCL 5 MG PO TABS: 5.0000 mg | ORAL_TABLET | Freq: Four times a day (QID) | ORAL | 0 refills | Status: DC | PRN

## 2019-08-18 NOTE — Progress Notes (Signed)
Chief Complaint  Patient presents with  . Follow-up    Recheck on right knee, DOS 07-29-19.   Status post arthroscopic lavage for infection on August 17 by Dr. Ihor Gully in my absence  Patient currently on Vibramycin  Says his knee feels weird but better than it did before the most recent lavage  He still has a knee effusion there is no redness he has 3 sutures in which we removed  He appears to be nontender around the joint  He is on oxycodone for pain  Meds ordered this encounter  Medications  . oxyCODONE (OXY IR/ROXICODONE) 5 MG immediate release tablet    Sig: Take 1-2 tablets (5-10 mg total) by mouth every 6 (six) hours as needed for moderate pain (pain score 4-6).    Dispense:  42 tablet    Refill:  0  . doxycycline (VIBRAMYCIN) 100 MG capsule    Sig: Take 1 capsule (100 mg total) by mouth 2 (two) times daily for 21 days.    Dispense:  42 capsule    Refill:  0   Fu 1 week  Susceptibility   Staphylococcus aureus    MIC    CIPROFLOXACIN <=0.5 SENSI... Sensitive    CLINDAMYCIN <=0.25 SENS... Sensitive    ERYTHROMYCIN <=0.25 SENS... Sensitive    GENTAMICIN <=0.5 SENSI... Sensitive    Inducible Clindamycin NEGATIVE  Sensitive    OXACILLIN 0.5 SENSITIVE  Sensitive    RIFAMPIN <=0.5 SENSI... Sensitive    TETRACYCLINE <=1 SENSITIVE  Sensitive    TRIMETH/SULFA <=10 SENSIT... Sensitive    VANCOMYCIN 1 SENSITIVE  Sensitive         Susceptibility Comments  Staphylococcus aureus  ABUNDANT STAPHYLOCOCCUS AUREUS    Specimen Collected: 08/09/19 06:50

## 2019-08-18 NOTE — Discharge Summary (Signed)
Physician Discharge Summary  Patient ID: [name] [name]. MRN: [id number] DOB/AGE: 1961/03/26 58 y.o.  Admit date: 08/09/2019 Discharge date: 08/11/2019   Procedures:  Procedure(s) (LRB): arthroscopic incision and drainage snovectomy (Right)  Attending Physician:  Dr. Paralee Cancel   Admission Diagnoses:   Infected right knee pain  Discharge Diagnoses:  Active Problems:   Septic arthritis [facility])  Past Medical History:  Diagnosis Date  . Acid reflux   . Alcohol abuse    6-8 cans of beer daily; hx of incarceration for DWI  . Arthritis   . Bipolar disorder ([place])   . Bronchitis   . Chronic back pain   . Chronic neck pain   . Chronic pain   . Diabetes mellitus without complication ([place])   . Fracture of lower leg   . Gout   . Hyperlipidemia   . Hypertension   . Left arm pain    chronic  . Migraine   . Pancreatitis    March 2013  . Pseudocyst of pancreas 02/28/2012  . Substance abuse [facility])     HPI:    [name] [name]., 58 y.o. male, he has a known history of alcohol abuse, bipolar disorder, diabetes, and substance abuse. He recently underwent a right knee arthroscopy with medial and lateral menisectomy by Dr. Aline Brochure on 07/29/19. He was having post-operative pain, and was seen in Dr. Ruthe Mannan office on 08/04/19. At that time, he was noted to have a small effusion with some serosanguineous drainage from the lateral port site. He was having significant pain, and was prescribed Percocet for pain management. Patient then presented to [facility] ED on 08/09/19 due to worsening pain. He states he twisted his knee, and the pain became worse. Radiographs of right knee dated 08/09/19 show mild soft tissue swelling and large knee joint effusion without associated fracture. Right knee aspirated in the [facility] ED of 30 cc cloudy joint fluid, which was sent for cell count, gram stain, and culture. Denies fever. Patient transferred to [facility] in preparation for arthroscopic irrigation and  debridement with Dr. Alvan Dame. Patient also has a history of left total hip arthroplasty by Dr. Ninfa Linden in 2017.  PCP: Denyce Marquies, FNP   Discharged Condition: good  Hospital Course:  Patient underwent the above stated procedure on 08/09/2019. Patient tolerated the procedure well and brought to the recovery room in good condition and subsequently to the floor.  POD #1 BP: 176/97 ; Pulse: 65 ; Temp: 98.1 F (36.7 C) ; Resp: 17 Patient reports pain as moderate.  Patient is well, and has had no acute complaints or problems other than pain in the right knee. Hemovac drain removed.  Patient is Alert and Oriented.  Extremity - Neurologically intact, Sensation intact distally, Intact pulses distally, Dorsiflexion/Plantar flexion intact.  Dressing C/D/I.  Motor Function - intact, moving foot and toes well on exam.   LABS  Basename    HGB     13.5  HCT     40.5   POD #2  BP: 137/96 ; Pulse: 63 ; Temp: 98.4 F (36.9 C) ; Resp: 18 Patient reports pain as mild, pain controlled. No events throughout the night.  Dressing changed. Discussed ID's plan of antibiotics moving forward.  Plan on d/c home today. Dorsiflexion/plantar flexion intact, incision: dressing C/D/I, no cellulitis present and compartment soft.   LABS   No new labs  Discharge Exam: General appearance: alert, cooperative and no distress Extremities: Homans sign is negative, no sign of DVT,  no edema, redness or tenderness in the calves or thighs and no ulcers, gangrene or trophic changes  Disposition:  Home with follow up in 2 weeks   Follow-up Information    Carole Civil, MD. Schedule an appointment as soon as possible for a visit in 1 week(s).   Specialties: Orthopedic Surgery, Radiology Contact information: [address] [place] [address] [phone]           Discharge Instructions    Call MD / Call 911   Complete by: As directed    If you experience chest pain or shortness of breath, CALL 911  and be transported to the hospital emergency room.  If you develope a fever above 101 F, pus (white drainage) or increased drainage or redness at the wound, or calf pain, call your surgeon's office.   Change dressing   Complete by: As directed    May remove ACE wrap tomorrow and cover incision with bandaid.   Constipation Prevention   Complete by: As directed    Drink plenty of fluids.  Prune juice may be helpful.  You may use a stool softener, such as Colace (over the counter) 100 mg twice a day.  Use MiraLax (over the counter) for constipation as needed.   Diet - low sodium heart healthy   Complete by: As directed    Increase activity slowly as tolerated   Complete by: As directed    Weight bearing as tolerated with assist device (walker, cane, etc) as directed, use it as long as suggested by your surgeon or therapist, typically at least 4-6 weeks.   TED hose   Complete by: As directed    Use stockings (TED hose) for 2 weeks on both leg(s).  You may remove them at night for sleeping.      Allergies as of 08/11/2019      Reactions   Penicillins Itching, Other (See Comments)   Has patient had a PCN reaction causing immediate rash, facial/tongue/throat swelling, SOB or lightheadedness with hypotension: No Has patient had a PCN reaction causing severe rash involving mucus membranes or skin necrosis: No Has patient had a PCN reaction that required hospitalization No Has patient had a PCN reaction occurring within the last 10 years: No If all of the above answers are "NO", then may proceed with Cephalosporin use.      Medication List    STOP taking these medications   cholecalciferol 25 MCG (1000 UT) tablet Commonly known as: VITAMIN D3   Fish Oil 1000 MG Caps   HYDROcodone-acetaminophen 10-325 MG tablet Commonly known as: Norco   ibuprofen 600 MG tablet Commonly known as: ADVIL   multivitamin with minerals Tabs tablet     TAKE these medications   amLODipine 5 MG tablet  Commonly known as: NORVASC Take 5 mg by mouth daily.   celecoxib 100 MG capsule Commonly known as: CELEBREX Take 100 mg by mouth daily as needed for moderate pain.   citalopram 20 MG tablet Commonly known as: CELEXA Take 1 tablet by mouth daily.   docusate sodium 100 MG capsule Commonly known as: COLACE Take 1 capsule (100 mg total) by mouth 2 (two) times daily.   doxycycline 100 MG capsule Commonly known as: Vibramycin Take 1 capsule (100 mg total) by mouth 2 (two) times daily for 21 days. Start taking on: August 19, 2019   fluticasone 50 MCG/ACT nasal spray Commonly known as: FLONASE Place 2 sprays into both nostrils daily as needed for allergies.  gabapentin 300 MG capsule Commonly known as: NEURONTIN Take 300 mg by mouth 3 (three) times daily as needed (for pain).   levocetirizine 5 MG tablet Commonly known as: XYZAL Take 5 mg by mouth every evening.   lisinopril 10 MG tablet Commonly known as: ZESTRIL Take 10 mg by mouth daily.   lovastatin 40 MG tablet Commonly known as: MEVACOR Take 1 tablet by mouth daily.   metFORMIN 850 MG tablet Commonly known as: GLUCOPHAGE Take 850 mg by mouth daily with breakfast.   Mucus Relief 400 MG Tabs tablet Generic drug: guaifenesin Take 400 mg by mouth daily.   omeprazole 40 MG capsule Commonly known as: PRILOSEC Take 40 mg by mouth daily.   oxyCODONE 5 MG immediate release tablet Commonly known as: Oxy IR/ROXICODONE Take 1-2 tablets (5-10 mg total) by mouth every 6 (six) hours as needed for moderate pain (pain score 4-6).   polyethylene glycol 17 g packet Commonly known as: MIRALAX / GLYCOLAX Take 17 g by mouth daily as needed for mild constipation.   tamsulosin 0.4 MG Caps capsule Commonly known as: FLOMAX Take 1 capsule by mouth daily.   traZODone 100 MG tablet Commonly known as: DESYREL Take 1-2 tablets by mouth at bedtime as needed.            Discharge [name] Instructions  (From admission, onward)          Start     Ordered   08/11/19 0000  Change dressing    Comments: May remove ACE wrap tomorrow and cover incision with bandaid.   08/11/19 1032           Signed: West Pugh. Empress Newmann   PA-C  08/18/2019, 8:39 AM

## 2019-08-25 ENCOUNTER — Ambulatory Visit (INDEPENDENT_AMBULATORY_CARE_PROVIDER_SITE_OTHER): Payer: Medicare Other | Admitting: Orthopedic Surgery

## 2019-08-25 ENCOUNTER — Other Ambulatory Visit: Payer: Self-pay

## 2019-08-25 ENCOUNTER — Encounter: Payer: Self-pay | Admitting: Orthopedic Surgery

## 2019-08-25 DIAGNOSIS — M00869 Arthritis due to other bacteria, unspecified knee: Secondary | ICD-10-CM

## 2019-08-25 DIAGNOSIS — Z9889 Other specified postprocedural states: Secondary | ICD-10-CM

## 2019-08-25 DIAGNOSIS — G8918 Other acute postprocedural pain: Secondary | ICD-10-CM

## 2019-08-25 MED ORDER — OXYCODONE HCL 5 MG PO CAPS: 5.0000 mg | ORAL_CAPSULE | ORAL | 0 refills | Status: AC | PRN

## 2019-08-25 NOTE — Progress Notes (Signed)
POST OP APPT   Chief Complaint  Patient presents with  . Routine Post Op    08/09/19   Original surgery August 6  Arthroscopic lavage for infection August 17  Follow-up visit postop day number 16-second surgery  Roberts knee looks good he still having pain requiring oxycodone but he says it does not feel like it did when it was infected.  A lot of the swelling is gone down he has good quadriceps function he can flex his knee 120 degrees he still sore in the synovium is still boggy  He is on doxycycline twice a day Oxycodone for pain  He can still use his walker transition to a cane do his exercises continue his antibiotics and follow-up in 2 weeks  Meds ordered this encounter  Medications  . oxycodone (OXY-IR) 5 MG capsule    Sig: Take 1 capsule (5 mg total) by mouth every 4 (four) hours as needed for up to 7 days.    Dispense:  42 capsule    Refill:  0    Encounter Diagnoses  Name Primary?  . S/P right knee arthroscopy 07/29/2019   . Bacterial infection of knee joint ([place])   . Post-operative pain Yes

## 2019-08-25 NOTE — Patient Instructions (Addendum)
Do exercises 3 times a day   Ice 4 x a day   Take pain meds as needed   TAKE ANTIBIOTICS

## 2019-09-01 ENCOUNTER — Other Ambulatory Visit: Payer: Self-pay | Admitting: Orthopedic Surgery

## 2019-09-01 DIAGNOSIS — G8918 Other acute postprocedural pain: Secondary | ICD-10-CM

## 2019-09-01 DIAGNOSIS — Z9889 Other specified postprocedural states: Secondary | ICD-10-CM

## 2019-09-01 DIAGNOSIS — M00869 Arthritis due to other bacteria, unspecified knee: Secondary | ICD-10-CM

## 2019-09-01 NOTE — Telephone Encounter (Signed)
Patient requests refill on Oxycodone 5 mgs. Qty 42  Sig: Take 1 capsule (5 mg total) by mouth every 4 (four) hours as needed for up to 7 days.  Patient states he uses [employer]

## 2019-09-02 ENCOUNTER — Encounter: Payer: Self-pay | Admitting: Internal Medicine

## 2019-09-02 ENCOUNTER — Ambulatory Visit (INDEPENDENT_AMBULATORY_CARE_PROVIDER_SITE_OTHER): Payer: Medicare Other | Admitting: Internal Medicine

## 2019-09-02 ENCOUNTER — Other Ambulatory Visit: Payer: Self-pay

## 2019-09-02 VITALS — BP 113/73 | HR 94 | Temp 98.7°F

## 2019-09-02 DIAGNOSIS — M0000 Staphylococcal arthritis, unspecified joint: Secondary | ICD-10-CM | POA: Diagnosis not present

## 2019-09-02 DIAGNOSIS — Z23 Encounter for immunization: Secondary | ICD-10-CM | POA: Diagnosis not present

## 2019-09-02 DIAGNOSIS — M25561 Pain in right knee: Secondary | ICD-10-CM

## 2019-09-02 NOTE — Progress Notes (Signed)
RFV: hospital follow up for post op infection  Patient ID: [name] [name]., male   DOB: 1961-12-04, 58 y.o.   MRN: [id number]  HPI 58yo M with hx of right knee instrumentation complicated by secondary bacterial infection likely from sinus tract/poor healing from lateral port. OR cx showed few MSSA. He was Given cefazolin wash out followed by  a dose of oritavancin to provide gram positive coverage which will be depot of roughly 10-14d. He was instructed to take  Doxycycline 100mg   BID x 3 wk which he is nearly finished. He states that he notices pain at bedtime. Mild swelling/warmth. Ports are well healed.   No fever, chills, nightsweats  Lab Results  Component Value Date   ESRSEDRATE 17 (H) 08/10/2019   Lab Results  Component Value Date   CRP 17.8 (H) 08/10/2019     Outpatient Encounter Medications as of 09/02/2019  Medication Sig  . amLODipine (NORVASC) 5 MG tablet Take 5 mg by mouth daily.  . celecoxib (CELEBREX) 100 MG capsule Take 100 mg by mouth daily as needed for moderate pain.   . citalopram (CELEXA) 20 MG tablet Take 1 tablet by mouth daily.  Marland Kitchen docusate sodium (COLACE) 100 MG capsule Take 1 capsule (100 mg total) by mouth 2 (two) times daily.  Marland Kitchen doxycycline (VIBRAMYCIN) 100 MG capsule Take 1 capsule (100 mg total) by mouth 2 (two) times daily for 21 days.  . fluticasone (FLONASE) 50 MCG/ACT nasal spray Place 2 sprays into both nostrils daily as needed for allergies.   Marland Kitchen gabapentin (NEURONTIN) 300 MG capsule Take 300 mg by mouth 3 (three) times daily as needed (for pain).   Marland Kitchen guaifenesin (MUCUS RELIEF) 400 MG TABS tablet Take 400 mg by mouth daily.  Marland Kitchen levocetirizine (XYZAL) 5 MG tablet Take 5 mg by mouth every evening.  Marland Kitchen lisinopril (PRINIVIL,ZESTRIL) 10 MG tablet Take 10 mg by mouth daily.   Marland Kitchen lovastatin (MEVACOR) 40 MG tablet Take 1 tablet by mouth daily.  . metFORMIN (GLUCOPHAGE) 850 MG tablet Take 850 mg by mouth daily with breakfast.   . omeprazole (PRILOSEC) 40 MG  capsule Take 40 mg by mouth daily.   Marland Kitchen oxyCODONE (OXY IR/ROXICODONE) 5 MG immediate release tablet TAKE (1) CAPSULE EVERY FOUR HOURS AS NEEDED FOR UP TO 7 DAYS.  Marland Kitchen polyethylene glycol (MIRALAX / GLYCOLAX) 17 g packet Take 17 g by mouth daily as needed for mild constipation.  . tamsulosin (FLOMAX) 0.4 MG CAPS capsule Take 1 capsule by mouth daily.  . traZODone (DESYREL) 100 MG tablet Take 1-2 tablets by mouth at bedtime as needed.   No facility-administered encounter medications on file as of 09/02/2019.      Patient Active Problem List   Diagnosis Date Noted  . Septic arthritis ([place]) 08/09/2019  . S/P right knee arthroscopy 07/29/2019 08/03/2019  . Acute lateral meniscus tear of right knee   . Acute medial meniscus tear of right knee   . Substance use disorder 03/16/2019  . History of diabetes mellitus 10/10/2016  . Avascular necrosis of hip, left ([place]) 10/04/2016  . Status post left hip replacement 10/04/2016  . Rash and nonspecific skin eruption 08/12/2013  . Cholelithiases 08/04/2013  . Subcutaneous nodule 07/21/2013  . Acute alcoholic pancreatitis [id number]  . Syncope 04/11/2013  . Prolonged Q-T interval on ECG 04/11/2013  . Cocaine abuse ([place]) 03/13/2013    Class: Chronic  . At risk for adverse drug event 02/14/2013  . DDD (degenerative disc disease), lumbar 02/01/2013  .  Dyspepsia 12/01/2012  . BPH (benign prostatic hyperplasia) 10/07/2012  . Allergic rhinitis 10/03/2012  . Transaminitis 10/02/2012  . Chronic pain syndrome 10/02/2012  . Essential hypertension, benign 07/01/2012  . Tobacco user 07/01/2012  . Hepatic steatosis 03/06/2012  . ED (erectile dysfunction) 03/06/2012  . Pseudocyst of pancreas 02/28/2012  . Alcohol abuse 02/25/2012  . GERD (gastroesophageal reflux disease) 02/23/2012  . Bipolar 1 disorder ([place]) 02/23/2012  . Hypertriglyceridemia 12/05/2011     Health Maintenance Due  Topic Date Due  . FOOT EXAM  06/17/1973  . OPHTHALMOLOGY EXAM  06/17/1973   . INFLUENZA VACCINE  07/24/2019    Social History   Tobacco Use  . Smoking status: Current Every Day Smoker    Packs/day: 0.50    Years: 25.00    Pack years: 12.50    Types: Cigarettes  . Smokeless tobacco: Former Systems developer    Types: Chew  Substance Use Topics  . Alcohol use: Yes    Comment: once a week  . Drug use: Not Currently    Types: Cocaine, Marijuana    Comment: 06/26/19 - Cocaine   Review of Systems Review of Systems  Constitutional: Negative for fever, chills, diaphoresis, activity change, appetite change, fatigue and unexpected weight change.  HENT: Negative for congestion, sore throat, rhinorrhea, sneezing, trouble swallowing and sinus pressure.  Eyes: Negative for photophobia and visual disturbance.  Respiratory: Negative for cough, chest tightness, shortness of breath, wheezing and stridor.  Cardiovascular: Negative for chest pain, palpitations and leg swelling.  Gastrointestinal: Negative for nausea, vomiting, abdominal pain, diarrhea, constipation, blood in stool, abdominal distention and anal bleeding.  Genitourinary: Negative for dysuria, hematuria, flank pain and difficulty urinating.  Musculoskeletal: +right knee pain Skin: Negative for color change, pallor, rash and wound.  Neurological: Negative for dizziness, tremors, weakness and light-headedness.  Hematological: Negative for adenopathy. Does not bruise/bleed easily.  Psychiatric/Behavioral: Negative for behavioral problems, confusion, sleep disturbance, dysphoric mood, decreased concentration and agitation.    Physical Exam   BP 113/73   Pulse 94   Temp 98.7 F (37.1 C)   Gen= a xo by 3 in nad Ext= right knee has element of medial swelling. Slight warmth, no drainage. Healed port sites Skin = echymosis to inferior medial knee CBC Lab Results  Component Value Date   WBC 7.8 08/09/2019   RBC 4.29 08/09/2019   HGB 13.5 08/09/2019   HCT 40.5 08/09/2019   PLT 337 08/09/2019   MCV 94.4 08/09/2019    MCH 31.5 08/09/2019   MCHC 33.3 08/09/2019   RDW 14.5 08/09/2019   LYMPHSABS 1.8 08/09/2019   MONOABS 0.6 08/09/2019   EOSABS 0.2 08/09/2019    BMET Lab Results  Component Value Date   NA 135 08/09/2019   K 3.6 08/09/2019   CL 102 08/09/2019   CO2 21 (L) 08/09/2019   GLUCOSE 132 (H) 08/09/2019   BUN 12 08/09/2019   CREATININE 0.80 08/11/2019   CALCIUM 9.3 08/09/2019   GFRNONAA >60 08/11/2019   GFRAA >60 08/11/2019      Assessment and Plan  mssa septic arthritis = - he will finsh up his course of abtx in 3 days. No need for further abtx  Knee pain =  - likely sequelae of septic arthritis. Recommend that he could ibuprofen 400/acetominophen 500mg  Q 12 hr if needed  He follows up with ortho in 6 days  Health maintenance = Will give flu vaccine today

## 2019-09-08 ENCOUNTER — Encounter: Payer: Self-pay | Admitting: Orthopedic Surgery

## 2019-09-08 ENCOUNTER — Other Ambulatory Visit: Payer: Self-pay

## 2019-09-08 ENCOUNTER — Ambulatory Visit (INDEPENDENT_AMBULATORY_CARE_PROVIDER_SITE_OTHER): Payer: Medicare Other | Admitting: Orthopedic Surgery

## 2019-09-08 DIAGNOSIS — G8918 Other acute postprocedural pain: Secondary | ICD-10-CM

## 2019-09-08 DIAGNOSIS — Z9889 Other specified postprocedural states: Secondary | ICD-10-CM

## 2019-09-08 MED ORDER — OXYCODONE HCL 5 MG PO TABS: ORAL_TABLET | ORAL | 0 refills | Status: DC

## 2019-09-08 NOTE — Progress Notes (Signed)
Chief Complaint  Patient presents with  . Post-op Follow-up    07/29/2019 knee scope/ post operative infection     SARK lavage for infection 08-09-2019  4 weeks of doxycycline  Mr. [name] [name] is improving slowly.  He still complains of popping and pain in his knee especially at night and early in the morning he is using oxycodone for pain is using doxycycline for the infection he had  He does not appear to have infection today although his knee is still sore his range of motion is 115 degrees of full extension he can now control his quads  Plan  Encounter Diagnoses  Name Primary?  . S/P right knee arthroscopy 07/29/2019   . Post-operative pain Yes     Stop doxycycline OXYCOdone for pain  Exercises as tolerated   4 weeks f/u

## 2019-09-08 NOTE — Patient Instructions (Signed)
Stop doxycycline OXYCOdone for pain  Exercises as tolerated   4 weeks f/u

## 2019-09-15 ENCOUNTER — Other Ambulatory Visit: Payer: Self-pay | Admitting: Orthopedic Surgery

## 2019-09-15 DIAGNOSIS — Z9889 Other specified postprocedural states: Secondary | ICD-10-CM

## 2019-09-15 DIAGNOSIS — G8918 Other acute postprocedural pain: Secondary | ICD-10-CM

## 2019-09-16 DIAGNOSIS — M25561 Pain in right knee: Secondary | ICD-10-CM | POA: Diagnosis not present

## 2019-09-16 DIAGNOSIS — Z136 Encounter for screening for cardiovascular disorders: Secondary | ICD-10-CM | POA: Diagnosis not present

## 2019-09-16 DIAGNOSIS — E1140 Type 2 diabetes mellitus with diabetic neuropathy, unspecified: Secondary | ICD-10-CM | POA: Diagnosis not present

## 2019-09-16 DIAGNOSIS — E785 Hyperlipidemia, unspecified: Secondary | ICD-10-CM | POA: Diagnosis not present

## 2019-09-16 DIAGNOSIS — I10 Essential (primary) hypertension: Secondary | ICD-10-CM | POA: Diagnosis not present

## 2019-09-22 ENCOUNTER — Telehealth: Payer: Self-pay | Admitting: Orthopedic Surgery

## 2019-09-22 ENCOUNTER — Other Ambulatory Visit: Payer: Self-pay | Admitting: Orthopedic Surgery

## 2019-09-22 DIAGNOSIS — Z9889 Other specified postprocedural states: Secondary | ICD-10-CM

## 2019-09-22 DIAGNOSIS — G8918 Other acute postprocedural pain: Secondary | ICD-10-CM

## 2019-09-22 NOTE — Telephone Encounter (Signed)
Mr. Szoke called and asked for the "lady up front".  I told him that I worked up front and asked if I could help him.  He told me no, he didn't need to talk to me.  He said he called earlier and spoke to the other lady who works upfront(Carol) and now he wants to speak to the supervisor.  He was very agitated and said a couple of curse words.  I told him that my supervisor Abigail Butts was in a meeting but that I would be glad to ask her call him as he requested.  Would you call him please?  Thanks

## 2019-09-23 ENCOUNTER — Other Ambulatory Visit: Payer: Self-pay | Admitting: Orthopedic Surgery

## 2019-09-23 DIAGNOSIS — G8918 Other acute postprocedural pain: Secondary | ICD-10-CM

## 2019-09-23 DIAGNOSIS — Z9889 Other specified postprocedural states: Secondary | ICD-10-CM

## 2019-09-23 MED ORDER — OXYCODONE HCL 5 MG PO TABS: ORAL_TABLET | ORAL | 0 refills | Status: DC

## 2019-09-23 NOTE — Telephone Encounter (Signed)
Refilled new pharm but no increase

## 2019-09-23 NOTE — Telephone Encounter (Signed)
I called patient and LM advised Rx sent to the new pharm for him.

## 2019-09-23 NOTE — Telephone Encounter (Signed)
I called, went to VM, I left message for him to call me back if assistance still needed.

## 2019-09-23 NOTE — Telephone Encounter (Signed)
I spoke with patient, he says the [facility] is out of the oxycodone for the Rx that you sent in for him.  He says that [facility] has this in stock.  Can you please cancel the Rx to [place], and send to [facility]?  Patient also asked about getting the 10mg  tablets instead of the 5mg  tablets.

## 2019-09-28 ENCOUNTER — Other Ambulatory Visit: Payer: Self-pay

## 2019-09-28 ENCOUNTER — Ambulatory Visit (HOSPITAL_COMMUNITY)
Admission: RE | Admit: 2019-09-28 | Discharge: 2019-09-28 | Disposition: A | Discharge status: Home / Self Care | Payer: Medicare Other | Source: Ambulatory Visit | Attending: Family Medicine | Admitting: Family Medicine

## 2019-09-28 DIAGNOSIS — Z20822 Contact with and (suspected) exposure to covid-19: Secondary | ICD-10-CM

## 2019-09-29 ENCOUNTER — Other Ambulatory Visit: Payer: Self-pay | Admitting: Orthopedic Surgery

## 2019-09-29 DIAGNOSIS — G8918 Other acute postprocedural pain: Secondary | ICD-10-CM

## 2019-09-29 DIAGNOSIS — Z9889 Other specified postprocedural states: Secondary | ICD-10-CM

## 2019-09-30 LAB — NOVEL CORONAVIRUS, NAA: SARS-CoV-2, NAA: NOT DETECTED

## 2019-10-06 ENCOUNTER — Other Ambulatory Visit: Payer: Self-pay

## 2019-10-06 ENCOUNTER — Ambulatory Visit (INDEPENDENT_AMBULATORY_CARE_PROVIDER_SITE_OTHER): Payer: Medicare Other | Admitting: Orthopedic Surgery

## 2019-10-06 VITALS — BP 127/89 | HR 76 | Temp 97.1°F | Ht 70.0 in | Wt 188.0 lb

## 2019-10-06 DIAGNOSIS — Z9889 Other specified postprocedural states: Secondary | ICD-10-CM

## 2019-10-06 DIAGNOSIS — M1711 Unilateral primary osteoarthritis, right knee: Secondary | ICD-10-CM

## 2019-10-06 DIAGNOSIS — G8918 Other acute postprocedural pain: Secondary | ICD-10-CM

## 2019-10-06 MED ORDER — OXYCODONE-ACETAMINOPHEN 5-325 MG PO TABS: 1.0000 | ORAL_TABLET | Freq: Four times a day (QID) | ORAL | 0 refills | Status: DC | PRN

## 2019-10-06 NOTE — Progress Notes (Signed)
58 postop knee scope August 6 complicated by infection treated with lavage right knee August 17 +4 weeks of doxycycline  Patient says his knee is still hurting  He had an effusion today I tapped it I got back 25 cc of clear fluid noninfectious  He can bend his knee 115 degrees he has full extension.  The knee was a little warm to touch but no erythema no pain with range of motion that would suggest infection  Recommend hinged brace Aspiration injection Follow-up 4 weeks Continue oxycodone  Meds ordered this encounter  Medications  . oxyCODONE-acetaminophen (PERCOCET/ROXICET) 5-325 MG tablet    Sig: Take 1 tablet by mouth every 6 (six) hours as needed for up to 7 days for severe pain.    Dispense:  28 tablet    Refill:  0    Procedure note injection and aspiration right knee joint  Verbal consent was obtained to aspirate and inject the right knee joint   Timeout was completed to confirm the site of aspiration and injection  An 18-gauge needle was used to aspirate the knee joint from a suprapatellar lateral approach.  The medications used were 40 mg of Depo-Medrol and 1% lidocaine 3 cc  Anesthesia was provided by ethyl chloride and the skin was prepped with alcohol.  After cleaning the skin with alcohol an 18-gauge needle was used to aspirate the right knee joint.  We obtained 25  cc of fluid clear   We follow this by injection of 80 mg of Depo-Medrol and 3 cc 1% lidocaine.  There were no complications. A sterile bandage was applied.

## 2019-10-13 ENCOUNTER — Other Ambulatory Visit: Payer: Self-pay | Admitting: Orthopedic Surgery

## 2019-10-14 DIAGNOSIS — E118 Type 2 diabetes mellitus with unspecified complications: Secondary | ICD-10-CM | POA: Diagnosis not present

## 2019-10-14 DIAGNOSIS — E559 Vitamin D deficiency, unspecified: Secondary | ICD-10-CM | POA: Diagnosis not present

## 2019-10-14 DIAGNOSIS — Z7984 Long term (current) use of oral hypoglycemic drugs: Secondary | ICD-10-CM | POA: Diagnosis not present

## 2019-10-14 DIAGNOSIS — I10 Essential (primary) hypertension: Secondary | ICD-10-CM | POA: Diagnosis not present

## 2019-10-14 DIAGNOSIS — M19031 Primary osteoarthritis, right wrist: Secondary | ICD-10-CM | POA: Diagnosis not present

## 2019-10-14 DIAGNOSIS — E1140 Type 2 diabetes mellitus with diabetic neuropathy, unspecified: Secondary | ICD-10-CM | POA: Diagnosis not present

## 2019-10-14 DIAGNOSIS — E785 Hyperlipidemia, unspecified: Secondary | ICD-10-CM | POA: Diagnosis not present

## 2019-10-14 DIAGNOSIS — M25561 Pain in right knee: Secondary | ICD-10-CM | POA: Diagnosis not present

## 2019-10-20 ENCOUNTER — Other Ambulatory Visit: Payer: Self-pay | Admitting: Orthopedic Surgery

## 2019-10-27 ENCOUNTER — Other Ambulatory Visit: Payer: Self-pay | Admitting: Orthopedic Surgery

## 2019-11-03 ENCOUNTER — Other Ambulatory Visit: Payer: Self-pay

## 2019-11-03 ENCOUNTER — Ambulatory Visit (INDEPENDENT_AMBULATORY_CARE_PROVIDER_SITE_OTHER): Payer: Medicare Other | Admitting: Orthopedic Surgery

## 2019-11-03 ENCOUNTER — Other Ambulatory Visit: Payer: Self-pay | Admitting: Orthopedic Surgery

## 2019-11-03 ENCOUNTER — Encounter: Payer: Self-pay | Admitting: Orthopedic Surgery

## 2019-11-03 VITALS — BP 142/97 | HR 76 | Ht 70.0 in | Wt 196.0 lb

## 2019-11-03 DIAGNOSIS — M25461 Effusion, right knee: Secondary | ICD-10-CM

## 2019-11-03 DIAGNOSIS — G8918 Other acute postprocedural pain: Secondary | ICD-10-CM

## 2019-11-03 DIAGNOSIS — M00869 Arthritis due to other bacteria, unspecified knee: Secondary | ICD-10-CM

## 2019-11-03 DIAGNOSIS — Z20822 Contact with and (suspected) exposure to covid-19: Secondary | ICD-10-CM

## 2019-11-03 DIAGNOSIS — Z9889 Other specified postprocedural states: Secondary | ICD-10-CM

## 2019-11-03 MED ORDER — OXYCODONE HCL 5 MG PO TABS: 5.0000 mg | ORAL_TABLET | Freq: Four times a day (QID) | ORAL | 0 refills | Status: DC | PRN

## 2019-11-03 NOTE — Patient Instructions (Signed)
Continue exercises  Continue brace  Continue cane  Continue oxycodone  Follow-up in 2 weeks

## 2019-11-03 NOTE — Progress Notes (Signed)
Chief Complaint  Patient presents with  . Knee Pain    right knee painful swollen gives out 07/29/2019 knee scope   Knee arthroscopy 8 - 6 complicated by knee infection rescoped on August 17  He still has to use his cane the knee feels like it is going to give out he still using a hinged knee brace  However his knee is not warm to touch she can flex it now past 100 degrees he does have an effusion which was tapped and got back thin blood-tinged synovial fluid  Procedure note injection and aspiration right knee joint  Verbal consent was obtained to aspirate and inject the right knee joint   Timeout was completed to confirm the site of aspiration and injection  An 18-gauge needle was used to aspirate the knee joint from a suprapatellar lateral approach.  The medications used were 40 mg of Depo-Medrol and 1% lidocaine 3 cc  Anesthesia was provided by ethyl chloride and the skin was prepped with alcohol.  After cleaning the skin with alcohol an 18-gauge needle was used to aspirate the right knee joint.  We obtained 45  cc of fluid blood tinged synovial fluid  We follow this by injection of 40 mg of Depo-Medrol and 3 cc 1% lidocaine.  There were no complications. A sterile bandage was applied.  I rechecked his ligaments there is solid  Recommend continue current knee exercises come back in 2 weeks I refilled his pain medicine he should use his cane and brace

## 2019-11-05 LAB — NOVEL CORONAVIRUS, NAA: SARS-CoV-2, NAA: NOT DETECTED

## 2019-11-10 ENCOUNTER — Other Ambulatory Visit: Payer: Self-pay | Admitting: Orthopedic Surgery

## 2019-11-10 ENCOUNTER — Telehealth: Payer: Self-pay | Admitting: *Deleted

## 2019-11-10 DIAGNOSIS — Z9889 Other specified postprocedural states: Secondary | ICD-10-CM

## 2019-11-10 DIAGNOSIS — G8918 Other acute postprocedural pain: Secondary | ICD-10-CM

## 2019-11-10 NOTE — Telephone Encounter (Signed)
Pt given result of COVID test obtained 11/03/2019; he verbalized understanding.

## 2019-11-16 DIAGNOSIS — I10 Essential (primary) hypertension: Secondary | ICD-10-CM | POA: Diagnosis not present

## 2019-11-16 DIAGNOSIS — Z7984 Long term (current) use of oral hypoglycemic drugs: Secondary | ICD-10-CM | POA: Diagnosis not present

## 2019-11-16 DIAGNOSIS — E559 Vitamin D deficiency, unspecified: Secondary | ICD-10-CM | POA: Diagnosis not present

## 2019-11-16 DIAGNOSIS — E1140 Type 2 diabetes mellitus with diabetic neuropathy, unspecified: Secondary | ICD-10-CM | POA: Diagnosis not present

## 2019-11-16 DIAGNOSIS — E785 Hyperlipidemia, unspecified: Secondary | ICD-10-CM | POA: Diagnosis not present

## 2019-11-17 ENCOUNTER — Other Ambulatory Visit: Payer: Self-pay | Admitting: Orthopedic Surgery

## 2019-11-17 DIAGNOSIS — Z9889 Other specified postprocedural states: Secondary | ICD-10-CM

## 2019-11-17 DIAGNOSIS — G8918 Other acute postprocedural pain: Secondary | ICD-10-CM

## 2019-11-22 ENCOUNTER — Ambulatory Visit (INDEPENDENT_AMBULATORY_CARE_PROVIDER_SITE_OTHER): Payer: Medicare Other | Admitting: Orthopedic Surgery

## 2019-11-22 ENCOUNTER — Emergency Department (HOSPITAL_COMMUNITY)
Admission: EM | Admit: 2019-11-22 | Discharge: 2019-11-22 | Discharge status: Against Medical Advice | Payer: Medicare Other

## 2019-11-22 ENCOUNTER — Encounter: Payer: Self-pay | Admitting: Orthopedic Surgery

## 2019-11-22 ENCOUNTER — Other Ambulatory Visit: Payer: Self-pay

## 2019-11-22 VITALS — BP 105/74 | HR 81 | Temp 97.1°F | Ht 70.0 in | Wt 194.6 lb

## 2019-11-22 DIAGNOSIS — Z9889 Other specified postprocedural states: Secondary | ICD-10-CM

## 2019-11-22 DIAGNOSIS — G8918 Other acute postprocedural pain: Secondary | ICD-10-CM | POA: Diagnosis not present

## 2019-11-22 DIAGNOSIS — M19049 Primary osteoarthritis, unspecified hand: Secondary | ICD-10-CM | POA: Diagnosis not present

## 2019-11-22 MED ORDER — OXYCODONE HCL 5 MG PO TABS: ORAL_TABLET | ORAL | 0 refills | Status: DC

## 2019-11-22 MED ORDER — CELECOXIB 100 MG PO CAPS: 100.0000 mg | ORAL_CAPSULE | Freq: Every day | ORAL | 5 refills | Status: DC | PRN

## 2019-11-22 NOTE — Patient Instructions (Signed)
Take celerex for the hands   Wear the brace   Use the cane as needed   Fu in 2 months

## 2019-11-22 NOTE — ED Notes (Signed)
When I called pt for triage, he got up out of chair to follow me and then turned toward door and yelled "I'm not being seen" while walking out. Not sure why this was as pt had not been waiting long and would not answer when I asked why he was leaving. Pt left in no distress.

## 2019-11-22 NOTE — Progress Notes (Signed)
  Chief Complaint  Patient presents with  . Follow-up    Right Knee DOS 07/29/19    Secondary chief complaint pain both hands right and left thumb right worse than left.  Pain has been present for over 6 weeks no prior treatment.  Previously on Celebrex for another arthritic condition and currently not taking that  Patient had a right knee scope on August 6 complicated by infection  Arthroscopic lavage on August 17 comes in now 3-1/2 months later with improved range of motion decreased swelling after aspiration injection last visit with occasional giving way symptoms clicking and locking but improved pain currently on oxycodone and tapering at every 6 hours  He has a good straight leg raise today no effusion he does have a click when he straightens his knee.  Arthroscopically and by MRI his ligaments were stable.  He may have some degenerative disc disease lumbar spine  Review of systems pain in his hands without numbness   both hands show no swelling or deformity is tender with crepitance in the right metacarpophalangeal joint of the thumb mild tenderness on the left without crepitance  Start Celebrex 100 mg twice a day  Continue brace cane as needed  Continue oxycodone taper  Return in 2 months   Encounter Diagnoses  Name Primary?  . S/P right knee arthroscopy 07/29/2019   . Post-operative pain   . Arthritis of hand Yes

## 2019-12-01 ENCOUNTER — Other Ambulatory Visit: Payer: Self-pay | Admitting: Orthopedic Surgery

## 2019-12-01 DIAGNOSIS — Z9889 Other specified postprocedural states: Secondary | ICD-10-CM

## 2019-12-01 DIAGNOSIS — G8918 Other acute postprocedural pain: Secondary | ICD-10-CM

## 2019-12-01 MED ORDER — OXYCODONE HCL 5 MG PO TABS: 5.0000 mg | ORAL_TABLET | Freq: Three times a day (TID) | ORAL | 0 refills | Status: DC | PRN

## 2019-12-01 NOTE — Progress Notes (Signed)
Meds ordered this encounter  Medications  . oxyCODONE (OXY IR/ROXICODONE) 5 MG immediate release tablet    Sig: Take 1 tablet (5 mg total) by mouth every 8 (eight) hours as needed for up to 7 days for severe pain. TAKE (1) TABLET BY MOUTH EVERY 6 HOURS AS NEEDED FOR UP TO 7 DAYS FOR SEVERE PAIN.    Dispense:  21 tablet    Refill:  0

## 2019-12-07 ENCOUNTER — Telehealth: Payer: Self-pay | Admitting: Orthopedic Surgery

## 2019-12-07 NOTE — Telephone Encounter (Signed)
Mr. [name] [name] called at 5:00 stating that his right knee has been swollen for about a week.  He wanted to know what to do.  He said he has been taking his meds and icing the knee.  I told him that I would send a message to Dr. Aline Brochure and we will see what he suggests for the patient to do.   He was fine with this.

## 2019-12-08 ENCOUNTER — Ambulatory Visit: Payer: Medicare Other | Admitting: Orthopedic Surgery

## 2019-12-08 ENCOUNTER — Other Ambulatory Visit: Payer: Self-pay | Admitting: Orthopedic Surgery

## 2019-12-08 DIAGNOSIS — Z9889 Other specified postprocedural states: Secondary | ICD-10-CM

## 2019-12-08 DIAGNOSIS — G8918 Other acute postprocedural pain: Secondary | ICD-10-CM

## 2019-12-08 NOTE — Telephone Encounter (Signed)
I called the patient and added him at 1:40 this afternoon.  He said he would try to get a ride here.  I told him to let me know if he couldn't come and we would try to schedule something for later in the week if he could get here.  He is to call me back and let me know for sure.

## 2019-12-08 NOTE — Telephone Encounter (Signed)
It is swollen, we need to see him, please.

## 2019-12-10 ENCOUNTER — Ambulatory Visit: Payer: Medicare Other | Admitting: Orthopedic Surgery

## 2019-12-15 ENCOUNTER — Other Ambulatory Visit: Payer: Self-pay | Admitting: Orthopedic Surgery

## 2019-12-15 ENCOUNTER — Telehealth: Payer: Self-pay | Admitting: Orthopedic Surgery

## 2019-12-15 DIAGNOSIS — G8918 Other acute postprocedural pain: Secondary | ICD-10-CM

## 2019-12-15 DIAGNOSIS — Z9889 Other specified postprocedural states: Secondary | ICD-10-CM

## 2019-12-15 NOTE — Telephone Encounter (Signed)
Make an appointment next week

## 2019-12-15 NOTE — Telephone Encounter (Signed)
Patient called this morning stating that his knee is swollen and it feels like something is torn inside.  He was scheduled to come this past Friday but didn't have any transportation.  He wants to know what he should do.    Please advise

## 2019-12-22 ENCOUNTER — Other Ambulatory Visit: Payer: Self-pay | Admitting: Orthopedic Surgery

## 2019-12-22 ENCOUNTER — Ambulatory Visit (INDEPENDENT_AMBULATORY_CARE_PROVIDER_SITE_OTHER): Payer: Medicare Other | Admitting: Orthopedic Surgery

## 2019-12-22 ENCOUNTER — Other Ambulatory Visit: Payer: Self-pay

## 2019-12-22 VITALS — BP 130/80 | HR 83 | Temp 97.1°F | Ht 70.0 in | Wt 195.0 lb

## 2019-12-22 DIAGNOSIS — Z9889 Other specified postprocedural states: Secondary | ICD-10-CM | POA: Diagnosis not present

## 2019-12-22 DIAGNOSIS — M25461 Effusion, right knee: Secondary | ICD-10-CM | POA: Diagnosis not present

## 2019-12-22 DIAGNOSIS — G8929 Other chronic pain: Secondary | ICD-10-CM | POA: Diagnosis not present

## 2019-12-22 DIAGNOSIS — G8918 Other acute postprocedural pain: Secondary | ICD-10-CM

## 2019-12-22 MED ORDER — GABAPENTIN 400 MG PO CAPS: 400.0000 mg | ORAL_CAPSULE | Freq: Every day | ORAL | 5 refills | Status: DC

## 2019-12-22 MED ORDER — HYDROCODONE-ACETAMINOPHEN 10-325 MG PO TABS: 1.0000 | ORAL_TABLET | ORAL | 0 refills | Status: DC | PRN

## 2019-12-22 NOTE — Patient Instructions (Signed)
[name] [name] you are having continued pain in your knee based on the arthritis and the scarring that occurred after the infection  You should be on gabapentin and I am changing the dose to 400 mg at night  Your pain medication and your Celebrex  Continue to use your brace and your cane for walking and I will see you in 4 weeks

## 2019-12-22 NOTE — Progress Notes (Signed)
Chief Complaint  Patient presents with  . Follow-up    Recheck on right knee, DOS 07-29-19.    [name] [name] had surgery on his right knee August 6 arthroscopy and then had an infection was treated with lavage on August 17  Complains of pain at night continued swelling  He is currently on Celebrex Percocet and use of the brace and a cane  We are on Percocet taper and switch him over to [employer] today  He is here for follow-up  Review of systems no fever warmth erythema redness to the right knee    BP 130/80   Pulse 83   Temp (!) 97.1 F (36.2 C)   Ht 5\' 10"  (1.778 m)   Wt 195 lb (88.5 kg)   BMI 27.98 kg/m   Exam shows no erythema no warmth to touch.  He does have an effusion.  His knee is straight his flexion is about 110 degrees  Knee feels stable  Muscle tone and strength are normal  Neurovascular exam is intact  Recommend aspiration injection right knee    Procedure note injection and aspiration right knee joint  Verbal consent was obtained to aspirate and inject the right knee joint   Timeout was completed to confirm the site of aspiration and injection  An 18-gauge needle was used to aspirate the knee joint from a suprapatellar lateral approach.  The medications used were 40 mg of Depo-Medrol and 1% lidocaine 3 cc  Anesthesia was provided by ethyl chloride and the skin was prepped with alcohol.  After cleaning the skin with alcohol an 18-gauge needle was used to aspirate the right knee joint.  We obtained 30  cc of fluid clear noninfectious fluid  We follow this by injection of 40 mg of Depo-Medrol and 3 cc 1% lidocaine.  There were no complications. A sterile bandage was applied.  [name] [name] you are having continued pain in your knee based on the arthritis and the scarring that occurred after the infection  You should be on gabapentin and I am changing the dose to 400 mg at night  Your pain medication and your Celebrex  Continue to use your brace and your  cane for walking and I will see you in 4 weeks  Encounter Diagnoses  Name Primary?  . S/P right knee arthroscopy 07/29/2019 Yes  . Effusion of right knee   . Other chronic pain

## 2019-12-29 ENCOUNTER — Other Ambulatory Visit: Payer: Self-pay | Admitting: Orthopedic Surgery

## 2020-01-05 ENCOUNTER — Other Ambulatory Visit: Payer: Self-pay | Admitting: Orthopedic Surgery

## 2020-01-12 ENCOUNTER — Other Ambulatory Visit: Payer: Self-pay | Admitting: Orthopedic Surgery

## 2020-01-18 DIAGNOSIS — E1140 Type 2 diabetes mellitus with diabetic neuropathy, unspecified: Secondary | ICD-10-CM | POA: Diagnosis not present

## 2020-01-18 DIAGNOSIS — E785 Hyperlipidemia, unspecified: Secondary | ICD-10-CM | POA: Diagnosis not present

## 2020-01-18 DIAGNOSIS — I10 Essential (primary) hypertension: Secondary | ICD-10-CM | POA: Diagnosis not present

## 2020-01-18 DIAGNOSIS — Z0001 Encounter for general adult medical examination with abnormal findings: Secondary | ICD-10-CM | POA: Diagnosis not present

## 2020-01-19 ENCOUNTER — Other Ambulatory Visit: Payer: Self-pay | Admitting: Orthopedic Surgery

## 2020-01-24 ENCOUNTER — Encounter: Payer: Self-pay | Admitting: Orthopedic Surgery

## 2020-01-24 ENCOUNTER — Ambulatory Visit: Payer: Medicare Other | Admitting: Orthopedic Surgery

## 2020-01-26 ENCOUNTER — Other Ambulatory Visit: Payer: Self-pay | Admitting: Orthopedic Surgery

## 2020-01-28 ENCOUNTER — Ambulatory Visit: Payer: Medicare Other

## 2020-01-28 ENCOUNTER — Other Ambulatory Visit: Payer: Self-pay

## 2020-01-28 ENCOUNTER — Ambulatory Visit (INDEPENDENT_AMBULATORY_CARE_PROVIDER_SITE_OTHER): Payer: Medicare Other | Admitting: Orthopedic Surgery

## 2020-01-28 ENCOUNTER — Encounter: Payer: Self-pay | Admitting: Orthopedic Surgery

## 2020-01-28 VITALS — Temp 93.0°F | Ht 70.0 in | Wt 185.4 lb

## 2020-01-28 DIAGNOSIS — Z9889 Other specified postprocedural states: Secondary | ICD-10-CM

## 2020-01-28 DIAGNOSIS — G8929 Other chronic pain: Secondary | ICD-10-CM

## 2020-01-28 DIAGNOSIS — M25461 Effusion, right knee: Secondary | ICD-10-CM

## 2020-01-28 DIAGNOSIS — M25561 Pain in right knee: Secondary | ICD-10-CM

## 2020-01-28 NOTE — Progress Notes (Signed)
Chief Complaint  Patient presents with  . Knee Pain    R/ hurting bad for the last couple of weeks   59 year old male status post arthroscopy right knee for meniscectomy and then developed infection had a lavage  Has had chronic pain since then  Presents now with increasing pain over the last 2 weeks no evidence of fever chills warmth erythema around the knee joint  Says his knee gives out when he goes downstairs  Review of Systems  Constitutional: Negative for chills and fever.  Musculoskeletal: Positive for back pain.  Neurological: Negative for tingling.   Temp (!) 93 F (33.9 C)   Ht 5\' 10"  (1.778 m)   Wt 185 lb 6 oz (84.1 kg)   BMI 26.60 kg/m   Knee has an effusion no warmth full extension flexes about 110 degrees feels stable no erythema  X-ray was negative for any acute process there was an effusion joint surfaces are actually maintained medially and laterally with mild osteophytes noted on the periphery of the joint  Impression Joint effusion  Encounter Diagnoses  Name Primary?  . S/P right knee arthroscopy 07/29/2019 Yes  . Chronic pain of right knee   . Effusion, right knee     Aspiration of the right knee joint 50 cc of clear yellow fluid injection was performed  Procedure note injection and aspiration left knee joint  Verbal consent was obtained to aspirate and inject the left knee joint   Timeout was completed to confirm the site of aspiration and injection  An 18-gauge needle was used to aspirate the left knee joint from a suprapatellar lateral approach.  The medications used were 40 mg of Depo-Medrol and 1% lidocaine 3 cc  Anesthesia was provided by ethyl chloride and the skin was prepped with alcohol.  After cleaning the skin with alcohol an 18-gauge needle was used to aspirate the right knee joint.  We obtained 50 cc of fluid clear yellow  We followed this by injection of 40 mg of Depo-Medrol and 3 cc 1% lidocaine.  There were no  complications. A sterile bandage was applied.   Recommend physical therapy for the knee joint follow-up in 6 weeks

## 2020-01-28 NOTE — Patient Instructions (Addendum)
We will order physical therapy you should receive a phone call from the PT department. If you havent in 3 days please call us to follow up on it    Smoking Tobacco Information, Adult Smoking tobacco can be harmful to your health. Tobacco contains a poisonous (toxic), colorless chemical called nicotine. Nicotine is addictive. It changes the brain and can make it hard to stop smoking. Tobacco also has other toxic chemicals that can hurt your body and raise your risk of many cancers. How can smoking tobacco affect me? Smoking tobacco puts you at risk for:  Cancer. Smoking is most commonly associated with lung cancer, but can also lead to cancer in other parts of the body.  Chronic obstructive pulmonary disease (COPD). This is a long-term lung condition that makes it hard to breathe. It also gets worse over time.  High blood pressure (hypertension), heart disease, stroke, or heart attack.  Lung infections, such as pneumonia.  Cataracts. This is when the lenses in the eyes become clouded.  Digestive problems. This may include peptic ulcers, heartburn, and gastroesophageal reflux disease (GERD).  Oral health problems, such as gum disease and tooth loss.  Loss of taste and smell. Smoking can affect your appearance by causing:  Wrinkles.  Yellow or stained teeth, fingers, and fingernails. Smoking tobacco can also affect your social life, because:  It may be challenging to find places to smoke when away from home. Many workplaces, [employer], hotels, and public places are tobacco-free.  Smoking is expensive. This is due to the cost of tobacco and the long-term costs of treating health problems from smoking.  Secondhand smoke may affect those around you. Secondhand smoke can cause lung cancer, breathing problems, and heart disease. Children of smokers have a higher risk for: ? Sudden infant death syndrome (SIDS). ? Ear infections. ? Lung infections. If you currently smoke tobacco,  quitting now can help you:  Lead a longer and healthier life.  Look, smell, breathe, and feel better over time.  Save money.  Protect others from the harms of secondhand smoke. What actions can I take to prevent health problems? Quit smoking   Do not start smoking. Quit if you already do.  Make a plan to quit smoking and commit to it. Look for programs to help you and ask your health care provider for recommendations and ideas.  Set a date and write down all the reasons you want to quit.  Let your friends and family know you are quitting so they can help and support you. Consider finding friends who also want to quit. It can be easier to quit with someone else, so that you can support each other.  Talk with your health care provider about using nicotine replacement medicines to help you quit, such as gum, lozenges, patches, sprays, or pills.  Do not replace cigarette smoking with electronic cigarettes, which are commonly called e-cigarettes. The safety of e-cigarettes is not known, and some may contain harmful chemicals.  If you try to quit but return to smoking, stay positive. It is common to slip up when you first quit, so take it one day at a time.  Be prepared for cravings. When you feel the urge to smoke, chew gum or suck on hard candy. Lifestyle  Stay busy and take care of your body.  Drink enough fluid to keep your urine pale yellow.  Get plenty of exercise and eat a healthy diet. This can help prevent weight gain after quitting.  Monitor your eating  habits. Quitting smoking can cause you to have a larger appetite than when you smoke.  Find ways to relax. Go out with friends or family to a movie or a restaurant where people do not smoke.  Ask your health care provider about having regular tests (screenings) to check for cancer. This may include blood tests, imaging tests, and other tests.  Find ways to manage your stress, such as meditation, yoga, or exercise. Where  to find support To get support to quit smoking, consider:  Asking your health care provider for more information and resources.  Taking classes to learn more about quitting smoking.  Looking for local organizations that offer resources about quitting smoking.  Joining a support group for people who want to quit smoking in your local community.  Calling the smokefree.gov counselor helpline: 1-800-Quit-Now 629-804-3592) Where to find more information You may find more information about quitting smoking from:  HelpGuide.org: www.helpguide.org  https://hall.com/: smokefree.gov  American Lung Association: www.lung.org Contact a health care provider if you:  Have problems breathing.  Notice that your lips, nose, or fingers turn blue.  Have chest pain.  Are coughing up blood.  Feel faint or you pass out.  Have other health changes that cause you to worry. Summary  Smoking tobacco can negatively affect your health, the health of those around you, your finances, and your social life.  Do not start smoking. Quit if you already do. If you need help quitting, ask your health care provider.  Think about joining a support group for people who want to quit smoking in your local community. There are many effective programs that will help you to quit this behavior. This information is not intended to replace advice given to you by your health care provider. Make sure you discuss any questions you have with your health care provider. Document Revised: 09/03/2019 Document Reviewed: 12/24/2016 Elsevier Patient Education  2020 [employer].

## 2020-01-31 ENCOUNTER — Ambulatory Visit (HOSPITAL_COMMUNITY): Payer: Medicare Other | Admitting: Physical Therapy

## 2020-01-31 ENCOUNTER — Telehealth (HOSPITAL_COMMUNITY): Payer: Self-pay | Admitting: Physical Therapy

## 2020-01-31 NOTE — Telephone Encounter (Signed)
pt stated he cannot make today's appt.

## 2020-02-01 ENCOUNTER — Other Ambulatory Visit: Payer: Self-pay

## 2020-02-01 ENCOUNTER — Ambulatory Visit
Admission: EM | Admit: 2020-02-01 | Discharge: 2020-02-01 | Disposition: A | Discharge status: Home / Self Care | Payer: Medicare Other | Attending: Emergency Medicine | Admitting: Emergency Medicine

## 2020-02-01 DIAGNOSIS — Z20822 Contact with and (suspected) exposure to covid-19: Secondary | ICD-10-CM

## 2020-02-01 DIAGNOSIS — Z0189 Encounter for other specified special examinations: Secondary | ICD-10-CM | POA: Diagnosis not present

## 2020-02-01 DIAGNOSIS — Z202 Contact with and (suspected) exposure to infections with a predominantly sexual mode of transmission: Secondary | ICD-10-CM | POA: Insufficient documentation

## 2020-02-01 NOTE — Discharge Instructions (Signed)
Penile self swab  obtained We will follow up with you regarding the results of your test If tests are positive, please abstain from sexual activity for at least 7 days and notify partners Follow up with PCP if symptoms persists Return here or go to ER if you have any new or worsening symptom   COVID testing ordered.  It will take between 2-7 days for test results.  Someone will contact you regarding abnormal results.    In the meantime: You should remain isolated in your home for 10 days from symptom onset AND greater than 72 hours after symptoms resolution (absence of fever without the use of fever-reducing medication and improvement in respiratory symptoms), whichever is longer Get plenty of rest and push fluids Use medications daily for symptom relief Use OTC medications like ibuprofen or tylenol as needed fever or pain Call or go to the ED if you have any new or worsening symptoms such as fever, worsening cough, shortness of breath, chest tightness, chest pain, turning blue, changes in mental status, etc..Marland Kitchen

## 2020-02-01 NOTE — ED Provider Notes (Signed)
[facility]    CSN: [id number] Arrival date & time: 02/01/20  1618      History   Chief Complaint Chief Complaint  Patient presents with  . COVID test    HPI [name]. is a 59 y.o. male.   Who presented to the urgent care for complaint of STD exposure.  He denies a precipitating event, recent sexual encounter or recent antibiotic use.  Patient is sexually active with 1 male partner. Denies penile discharge.  Report he has not tried any OTC medication.  Denies any worsening symptoms.  He reports similar symptoms in the past and was diagnosed and treated appropriately.  He denies fever, chills, nausea, vomiting, abdominal or pelvic pain, urinary symptoms, vaginal itching, vaginal odor, vaginal bleeding, dyspareunia, vaginal rashes or lesions.  Patient wanted to be tested for COVID-19 due to Covid exposure.    The history is provided by the patient. No language interpreter was used.    Past Medical History:  Diagnosis Date  . Acid reflux   . Alcohol abuse    6-8 cans of beer daily; hx of incarceration for DWI  . Arthritis   . Bipolar disorder ([place])   . Bronchitis   . Chronic back pain   . Chronic neck pain   . Chronic pain   . Diabetes mellitus without complication ([place])   . Fracture of lower leg   . Gout   . Hyperlipidemia   . Hypertension   . Left arm pain    chronic  . Migraine   . Pancreatitis    March 2013  . Pseudocyst of pancreas 02/28/2012  . Substance abuse [facility])     Patient Active Problem List   Diagnosis Date Noted  . Septic arthritis ([place]) 08/09/2019  . S/P right knee arthroscopy 07/29/2019 08/03/2019  . Acute lateral meniscus tear of right knee   . Acute medial meniscus tear of right knee   . Substance use disorder 03/16/2019  . History of diabetes mellitus 10/10/2016  . Avascular necrosis of hip, left ([place]) 10/04/2016  . Status post left hip replacement 10/04/2016  . Rash and nonspecific skin eruption 08/12/2013  . Cholelithiases  08/04/2013  . Subcutaneous nodule 07/21/2013  . Acute alcoholic pancreatitis [id number]  . Syncope 04/11/2013  . Prolonged Q-T interval on ECG 04/11/2013  . Cocaine abuse ([place]) 03/13/2013    Class: Chronic  . At risk for adverse drug event 02/14/2013  . DDD (degenerative disc disease), lumbar 02/01/2013  . Dyspepsia 12/01/2012  . BPH (benign prostatic hyperplasia) 10/07/2012  . Allergic rhinitis 10/03/2012  . Transaminitis 10/02/2012  . Chronic pain syndrome 10/02/2012  . Essential hypertension, benign 07/01/2012  . Tobacco user 07/01/2012  . Hepatic steatosis 03/06/2012  . ED (erectile dysfunction) 03/06/2012  . Pseudocyst of pancreas 02/28/2012  . Alcohol abuse 02/25/2012  . GERD (gastroesophageal reflux disease) 02/23/2012  . Bipolar 1 disorder ([place]) 02/23/2012  . Hypertriglyceridemia 12/05/2011    Past Surgical History:  Procedure Laterality Date  . CIRCUMCISION  03/17/2012   Procedure: CIRCUMCISION ADULT;  Surgeon: Marissa Nestle, MD;  Location: [facility];  Service: Urology;  Laterality: N/A;  . COLONOSCOPY WITH PROPOFOL  12/24/2012   [id number] lesion as described above status post biopsy; otherwise normal rectum/Sigmoid diverticulosis/Sigmoid polyps -- resected as described above. anal lesion, benign. colon polyp, hyperplastic  . ESOPHAGOGASTRODUODENOSCOPY (EGD) WITH PROPOFOL  12/24/2012   [id number] erythema and erosions of uncertain significance status post gastric biopsy (reactive gastropathy NO h.pylori)  . KNEE  ARTHROSCOPY Right 08/09/2019   Procedure: arthroscopic incision and drainage snovectomy;  Surgeon: Paralee Cancel, MD;  Location: [facility];  Service: Orthopedics;  Laterality: Right;  . KNEE ARTHROSCOPY WITH MEDIAL MENISECTOMY Right 07/29/2019   Procedure: RIGHT KNEE ARTHROSCOPY WITH MEDIAL MENISCECTOMY AND LATERAL MENISCECTOMY;  Surgeon: Carole Civil, MD;  Location: [facility];  Service: Orthopedics;  Laterality: Right;  . NOSE SURGERY     broken nose  . TOTAL HIP  ARTHROPLASTY Left 10/04/2016   Procedure: LEFT TOTAL HIP ARTHROPLASTY ANTERIOR APPROACH;  Surgeon: Mcarthur Rossetti, MD;  Location: [facility];  Service: Orthopedics;  Laterality: Left;       Home Medications    Prior to Admission medications   Medication Sig Start Date End Date Taking? Authorizing Provider  amLODipine (NORVASC) 5 MG tablet Take 5 mg by mouth daily.    [provider]  celecoxib (CELEBREX) 100 MG capsule Take 1 capsule (100 mg total) by mouth daily as needed for moderate pain. 11/22/19   Carole Civil, MD  citalopram (CELEXA) 20 MG tablet Take 1 tablet by mouth daily. 02/16/19   [provider]  docusate sodium (COLACE) 100 MG capsule Take 1 capsule (100 mg total) by mouth 2 (two) times daily. Patient not taking: Reported on 01/28/2020 08/11/19   Danae Orleans, PA-C  fluticasone [facility]) 50 MCG/ACT nasal spray Place 2 sprays into both nostrils daily as needed for allergies.     [provider]  gabapentin (NEURONTIN) 400 MG capsule Take 1 capsule (400 mg total) by mouth at bedtime. 12/22/19   Carole Civil, MD  guaifenesin (MUCUS RELIEF) 400 MG TABS tablet Take 400 mg by mouth daily.    [provider]  HYDROcodone-acetaminophen (NORCO) 10-325 MG tablet TAKE (1) TABLET BY MOUTH EVERY 4 HOURS AS NEEDED FOR UP TO 7 DAYS FOR PAIN. 01/26/20   Carole Civil, MD  levocetirizine (XYZAL) 5 MG tablet Take 5 mg by mouth every evening.    [provider]  lisinopril (PRINIVIL,ZESTRIL) 10 MG tablet Take 10 mg by mouth daily.  01/30/18   [provider]  lovastatin (MEVACOR) 40 MG tablet Take 1 tablet by mouth daily. 06/30/19   [provider]  metFORMIN (GLUCOPHAGE) 850 MG tablet Take 850 mg by mouth daily with breakfast.  01/30/18   [provider]  omeprazole (PRILOSEC) 40 MG capsule Take 40 mg by mouth daily.  01/30/18   [provider]  oxyCODONE (OXY IR/ROXICODONE) 5 MG immediate release  tablet TAKE (1) TABLET BY MOUTH EVERY 8 HOURS AS NEEDED FOR UP TO 7 DAYS FOR SEVERE PAIN. Patient not taking: Reported on 01/28/2020 12/15/19   Carole Civil, MD  oxyCODONE-acetaminophen (PERCOCET/ROXICET) 5-325 MG tablet TAKE 1 TABLET BY MOUTH EVERY 6 HOURS AS NEEDED. Patient not taking: Reported on 01/28/2020 10/27/19   Carole Civil, MD  polyethylene glycol (MIRALAX / GLYCOLAX) 17 g packet Take 17 g by mouth daily as needed for mild constipation. 08/11/19   Danae Orleans, PA-C  tamsulosin (FLOMAX) 0.4 MG CAPS capsule Take 1 capsule by mouth daily. 06/30/19   [provider]  traZODone (DESYREL) 100 MG tablet Take 1-2 tablets by mouth at bedtime as needed. 02/15/19   [provider]    Family History Family History  Problem Relation Age of Onset  . Diabetes Father   . Healthy Mother   . Anesthesia problems Neg Hx   . Hypotension Neg Hx   . Malignant hyperthermia Neg Hx   .  Pseudochol deficiency Neg Hx   . Colon cancer Neg Hx   . Heart disease Neg Hx   . Stroke Neg Hx   . Cancer Neg Hx     Social History Social History   Tobacco Use  . Smoking status: Current Every Day Smoker    Packs/day: 0.50    Years: 25.00    Pack years: 12.50    Types: Cigarettes  . Smokeless tobacco: Former Systems developer    Types: Chew  Substance Use Topics  . Alcohol use: Yes    Comment: once a week  . Drug use: Not Currently    Types: Cocaine, Marijuana    Comment: 06/26/19 - Cocaine     Allergies   Penicillins   Review of Systems Review of Systems  Constitutional: Negative.   HENT: Negative.   Respiratory: Negative.   Cardiovascular: Negative.   Genitourinary: Negative.   All other systems reviewed and are negative.    Physical Exam Triage Vital Signs ED Triage Vitals  Enc Vitals Group     BP      Pulse      Resp      Temp      Temp src      SpO2      Weight      Height      Head Circumference      Peak Flow      Pain Score      Pain Loc      Pain Edu?        Excl. in [place]?    No data found.  Updated Vital Signs BP 118/69 (BP Location: Right Arm)   Pulse 65   Temp 98.7 F (37.1 C) (Oral)   Resp 16   SpO2 97%   Visual Acuity Right Eye Distance:   Left Eye Distance:   Bilateral Distance:    Right Eye Near:   Left Eye Near:    Bilateral Near:     Physical Exam Vitals and nursing note reviewed.  Constitutional:      General: He is not in acute distress.    Appearance: Normal appearance. He is normal weight. He is not ill-appearing or toxic-appearing.  HENT:     Head: Normocephalic.     Right Ear: Tympanic membrane, ear canal and external ear normal. There is no impacted cerumen.     Left Ear: Tympanic membrane, ear canal and external ear normal. There is no impacted cerumen.     Nose: Nose normal. No congestion.     Mouth/Throat:     Mouth: Mucous membranes are moist.     Pharynx: Oropharynx is clear. No oropharyngeal exudate or posterior oropharyngeal erythema.  Cardiovascular:     Rate and Rhythm: Normal rate and regular rhythm.     Pulses: Normal pulses.     Heart sounds: Normal heart sounds. No murmur. No gallop.   Pulmonary:     Effort: Pulmonary effort is normal. No respiratory distress.     Breath sounds: Normal breath sounds. No wheezing, rhonchi or rales.  Chest:     Chest wall: No tenderness.  Abdominal:     General: Abdomen is flat. Bowel sounds are normal. There is no distension.     Palpations: There is no mass.     Tenderness: There is no abdominal tenderness.  Skin:    Capillary Refill: Capillary refill takes less than 2 seconds.  Neurological:     General: No focal deficit present.  Mental Status: He is alert and oriented to person, place, and time.      UC Treatments / Results  Labs (all labs ordered are listed, but only abnormal results are displayed) Labs Reviewed  NOVEL CORONAVIRUS, NAA  CYTOLOGY, (ORAL, ANAL, URETHRAL) ANCILLARY ONLY    EKG   Radiology No results  found.  Procedures Procedures (including critical care time)  Medications Ordered in UC Medications - No data to display  Initial Impression / Assessment and Plan / UC Course  I have reviewed the triage vital signs and the nursing notes.  Pertinent labs & imaging results that were available during my care of the patient were reviewed by me and considered in my medical decision making (see chart for details).     Patient stable for discharge. COVID-19 test was ordered  STD screening was completed Advised patient to quarantine To practice safe sex  Final Clinical Impressions(s) / UC Diagnoses   Final diagnoses:  STD exposure  Exposure to COVID-19 virus     Discharge Instructions     Penile self swab  obtained We will follow up with you regarding the results of your test If tests are positive, please abstain from sexual activity for at least 7 days and notify partners Follow up with PCP if symptoms persists Return here or go to ER if you have any new or worsening symptom   COVID testing ordered.  It will take between 2-7 days for test results.  Someone will contact you regarding abnormal results.    In the meantime: You should remain isolated in your home for 10 days from symptom onset AND greater than 72 hours after symptoms resolution (absence of fever without the use of fever-reducing medication and improvement in respiratory symptoms), whichever is longer Get plenty of rest and push fluids Use medications daily for symptom relief Use OTC medications like ibuprofen or tylenol as needed fever or pain Call or go to the ED if you have any new or worsening symptoms such as fever, worsening cough, shortness of breath, chest tightness, chest pain, turning blue, changes in mental status, etc...        ED Prescriptions    None     PDMP not reviewed this encounter.   Emerson Monte, FNP 02/01/20 [phone]

## 2020-02-01 NOTE — ED Triage Notes (Signed)
Pt presents with request to be tested for COVID. States he was potentially exposed a week ago. Denies any symptoms.  Pt also would like to be tested for Trichomonas. States that he was exposed through his partner. Denies any symptoms.

## 2020-02-02 ENCOUNTER — Other Ambulatory Visit: Payer: Self-pay | Admitting: Orthopedic Surgery

## 2020-02-02 LAB — NOVEL CORONAVIRUS, NAA: SARS-CoV-2, NAA: NOT DETECTED

## 2020-02-03 LAB — CYTOLOGY, (ORAL, ANAL, URETHRAL) ANCILLARY ONLY
Chlamydia: NEGATIVE
Neisseria Gonorrhea: NEGATIVE
Trichomonas: NEGATIVE

## 2020-02-09 ENCOUNTER — Other Ambulatory Visit: Payer: Self-pay | Admitting: Orthopedic Surgery

## 2020-02-09 DIAGNOSIS — G8929 Other chronic pain: Secondary | ICD-10-CM

## 2020-02-09 MED ORDER — HYDROCODONE-ACETAMINOPHEN 10-325 MG PO TABS: ORAL_TABLET | ORAL | 0 refills | Status: DC

## 2020-02-16 ENCOUNTER — Other Ambulatory Visit: Payer: Self-pay | Admitting: Orthopedic Surgery

## 2020-02-16 DIAGNOSIS — G8929 Other chronic pain: Secondary | ICD-10-CM

## 2020-02-23 ENCOUNTER — Other Ambulatory Visit: Payer: Self-pay | Admitting: Orthopedic Surgery

## 2020-02-23 DIAGNOSIS — Z9889 Other specified postprocedural states: Secondary | ICD-10-CM

## 2020-02-23 DIAGNOSIS — G8929 Other chronic pain: Secondary | ICD-10-CM

## 2020-02-23 DIAGNOSIS — M00869 Arthritis due to other bacteria, unspecified knee: Secondary | ICD-10-CM

## 2020-02-23 DIAGNOSIS — M25561 Pain in right knee: Secondary | ICD-10-CM

## 2020-02-23 MED ORDER — HYDROCODONE-ACETAMINOPHEN 7.5-325 MG PO TABS: 1.0000 | ORAL_TABLET | Freq: Three times a day (TID) | ORAL | 0 refills | Status: DC | PRN

## 2020-02-23 NOTE — Progress Notes (Signed)
Chronic knee pain after infection after knee arthroscopy  Taper opioids  Meds ordered this encounter  Medications  . DISCONTD: HYDROcodone-acetaminophen (NORCO) 7.5-325 MG tablet    Sig: Take 1 tablet by mouth every 8 (eight) hours as needed for moderate pain.    Dispense:  42 tablet    Refill:  0  . HYDROcodone-acetaminophen (NORCO) 7.5-325 MG tablet    Sig: Take 1 tablet by mouth every 8 (eight) hours as needed for moderate pain.    Dispense:  42 tablet    Refill:  0

## 2020-02-26 ENCOUNTER — Emergency Department (HOSPITAL_COMMUNITY)
Admission: EM | Admit: 2020-02-26 | Discharge: 2020-02-26 | Disposition: A | Discharge status: Home / Self Care | Payer: Medicare Other | Attending: Emergency Medicine | Admitting: Emergency Medicine

## 2020-02-26 ENCOUNTER — Emergency Department (HOSPITAL_COMMUNITY): Payer: Medicare Other

## 2020-02-26 ENCOUNTER — Other Ambulatory Visit: Payer: Self-pay

## 2020-02-26 ENCOUNTER — Encounter (HOSPITAL_COMMUNITY): Payer: Self-pay

## 2020-02-26 DIAGNOSIS — Z7984 Long term (current) use of oral hypoglycemic drugs: Secondary | ICD-10-CM | POA: Diagnosis not present

## 2020-02-26 DIAGNOSIS — S61216A Laceration without foreign body of right little finger without damage to nail, initial encounter: Secondary | ICD-10-CM | POA: Diagnosis not present

## 2020-02-26 DIAGNOSIS — F17210 Nicotine dependence, cigarettes, uncomplicated: Secondary | ICD-10-CM | POA: Diagnosis not present

## 2020-02-26 DIAGNOSIS — E119 Type 2 diabetes mellitus without complications: Secondary | ICD-10-CM | POA: Diagnosis not present

## 2020-02-26 DIAGNOSIS — Y999 Unspecified external cause status: Secondary | ICD-10-CM | POA: Diagnosis not present

## 2020-02-26 DIAGNOSIS — Y9289 Other specified places as the place of occurrence of the external cause: Secondary | ICD-10-CM | POA: Insufficient documentation

## 2020-02-26 DIAGNOSIS — W228XXA Striking against or struck by other objects, initial encounter: Secondary | ICD-10-CM | POA: Insufficient documentation

## 2020-02-26 DIAGNOSIS — M79641 Pain in right hand: Secondary | ICD-10-CM | POA: Diagnosis not present

## 2020-02-26 DIAGNOSIS — Y9389 Activity, other specified: Secondary | ICD-10-CM | POA: Insufficient documentation

## 2020-02-26 MED ORDER — LIDOCAINE HCL (PF) 2 % IJ SOLN: 10.0000 mL | Freq: Once | INTRAMUSCULAR | Status: AC

## 2020-02-26 MED ORDER — LIDOCAINE HCL (PF) 2 % IJ SOLN
INTRAMUSCULAR | Status: AC
  Filled 2020-02-26: qty 20
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
## 2020-02-26 NOTE — ED Triage Notes (Signed)
Pt reports he cut his right small finger last night. Unsure how he cut it or when

## 2020-02-26 NOTE — ED Notes (Signed)
Finger and dry dressing applied

## 2020-02-26 NOTE — Discharge Instructions (Signed)
Thank you for allowing me to care for you today. Please return to the emergency department if you have new or worsening symptoms.   

## 2020-03-01 ENCOUNTER — Telehealth: Payer: Self-pay | Admitting: Orthopedic Surgery

## 2020-03-01 DIAGNOSIS — G8929 Other chronic pain: Secondary | ICD-10-CM

## 2020-03-01 DIAGNOSIS — Z9889 Other specified postprocedural states: Secondary | ICD-10-CM

## 2020-03-01 DIAGNOSIS — M00869 Arthritis due to other bacteria, unspecified knee: Secondary | ICD-10-CM

## 2020-03-01 NOTE — Telephone Encounter (Signed)
[name] [name] called.  He asks that you call him.  Thanks

## 2020-03-01 NOTE — Telephone Encounter (Signed)
He wants his knee aspirated, I worked him in on Monday at 75, he also wants refill on Hydrocodone

## 2020-03-01 NOTE — Telephone Encounter (Signed)
I called him back. Left message for him to call me

## 2020-03-02 ENCOUNTER — Other Ambulatory Visit: Payer: Self-pay | Admitting: Orthopedic Surgery

## 2020-03-02 DIAGNOSIS — S61411D Laceration without foreign body of right hand, subsequent encounter: Secondary | ICD-10-CM | POA: Diagnosis not present

## 2020-03-02 DIAGNOSIS — E119 Type 2 diabetes mellitus without complications: Secondary | ICD-10-CM | POA: Diagnosis not present

## 2020-03-02 DIAGNOSIS — G8929 Other chronic pain: Secondary | ICD-10-CM

## 2020-03-02 DIAGNOSIS — M25561 Pain in right knee: Secondary | ICD-10-CM | POA: Diagnosis not present

## 2020-03-02 DIAGNOSIS — Z9889 Other specified postprocedural states: Secondary | ICD-10-CM

## 2020-03-02 DIAGNOSIS — M00869 Arthritis due to other bacteria, unspecified knee: Secondary | ICD-10-CM

## 2020-03-02 DIAGNOSIS — L03011 Cellulitis of right finger: Secondary | ICD-10-CM | POA: Diagnosis not present

## 2020-03-06 ENCOUNTER — Ambulatory Visit: Payer: Medicare Other | Admitting: Orthopedic Surgery

## 2020-03-06 DIAGNOSIS — R945 Abnormal results of liver function studies: Secondary | ICD-10-CM | POA: Diagnosis not present

## 2020-03-06 DIAGNOSIS — E785 Hyperlipidemia, unspecified: Secondary | ICD-10-CM | POA: Diagnosis not present

## 2020-03-06 DIAGNOSIS — E119 Type 2 diabetes mellitus without complications: Secondary | ICD-10-CM | POA: Diagnosis not present

## 2020-03-06 DIAGNOSIS — E559 Vitamin D deficiency, unspecified: Secondary | ICD-10-CM | POA: Diagnosis not present

## 2020-03-06 DIAGNOSIS — I10 Essential (primary) hypertension: Secondary | ICD-10-CM | POA: Diagnosis not present

## 2020-03-13 ENCOUNTER — Other Ambulatory Visit: Payer: Self-pay

## 2020-03-13 ENCOUNTER — Ambulatory Visit: Payer: Medicare Other | Admitting: Orthopedic Surgery

## 2020-03-13 ENCOUNTER — Encounter: Payer: Self-pay | Admitting: Orthopedic Surgery

## 2020-03-13 ENCOUNTER — Ambulatory Visit (INDEPENDENT_AMBULATORY_CARE_PROVIDER_SITE_OTHER): Payer: Medicare Other | Admitting: Orthopedic Surgery

## 2020-03-13 VITALS — BP 144/92 | HR 83 | Ht 70.0 in | Wt 185.0 lb

## 2020-03-13 DIAGNOSIS — M1711 Unilateral primary osteoarthritis, right knee: Secondary | ICD-10-CM | POA: Diagnosis not present

## 2020-03-13 DIAGNOSIS — G8929 Other chronic pain: Secondary | ICD-10-CM | POA: Diagnosis not present

## 2020-03-13 DIAGNOSIS — I10 Essential (primary) hypertension: Secondary | ICD-10-CM | POA: Diagnosis not present

## 2020-03-13 DIAGNOSIS — M25461 Effusion, right knee: Secondary | ICD-10-CM | POA: Diagnosis not present

## 2020-03-13 DIAGNOSIS — M25561 Pain in right knee: Secondary | ICD-10-CM

## 2020-03-13 DIAGNOSIS — E782 Mixed hyperlipidemia: Secondary | ICD-10-CM | POA: Diagnosis not present

## 2020-03-13 DIAGNOSIS — E119 Type 2 diabetes mellitus without complications: Secondary | ICD-10-CM | POA: Diagnosis not present

## 2020-03-13 DIAGNOSIS — E785 Hyperlipidemia, unspecified: Secondary | ICD-10-CM | POA: Diagnosis not present

## 2020-03-13 MED ORDER — OXYCODONE HCL 5 MG PO CAPS: 5.0000 mg | ORAL_CAPSULE | ORAL | 0 refills | Status: DC | PRN

## 2020-03-13 NOTE — Patient Instructions (Signed)
Please use alternative methods for pain relief  Use ice for swelling and heat for pain  Apply topical arthritis creams or medications which you can pick up over-the-counter  Rest and stay off the knee  If the pain is 8-10 then you can use opioids on a limited basis  What You Need to Know About Prescription Opioid Pain Medicine        Please be advised. You are on a medication which is classified as an "opiod". The CDC the [facility]  has recently advised all providers to advise patient's that these medications have certain risks which include but are not limited to:    drug intolerance  drug addiction  respiratory depression   respiratory failure  Death  Please keep these medications locked away. If you feel that you are becoming addicted to these medicines or you are having difficulties with these medications please alert your provider.   As your provider I will attempt to wean you off of these medications when you're severe acute pain has been taking care of. However, if we cannot wean you off of this medication you will be sent to a pain management center where they can better manage chronic pain   Opioids are powerful medicines that are used to treat moderate to severe pain. Opioids should be taken with the supervision of a trained health care provider. They should be taken for the shortest period of time as possible. This is because opioids can be addictive and the longer you take opioids, the greater your risk of addiction (opioid use disorder). What do opioids do? Opioids help reduce or eliminate pain. When used for short periods of time, they can help you:  Sleep better.  Do better in physical or occupational therapy.  Feel better in the first few days after an injury.  Recover from surgery. What kind of problems can opioids cause? Opioids can cause side effects, such  as:  Constipation.  Nausea.  Vomiting.  Drowsiness.  Confusion.  Opioid use disorder.  Breathing difficulties (respiratory depression). Using opioid pain medicines for longer than 3 days increases your risk of these side effects. Taking opioid pain medicine for a long period of time can affect your ability to do daily tasks. It also puts you at risk for:  Car accidents.  Heart attack.  Overdose, which can sometimes lead to death. What can increase my risk for developing problems while taking opioids? You may be at an especially high risk for problems while taking opioids if you:  Are over the age of 45.  Are pregnant.  Have kidney or liver disease.  Have certain mental health conditions, such as depression or anxiety.  Have a history of substance use disorder.  Have had an opioid overdose in the past. How do I stop taking opioids if I have been taking them for a long time? If you have been taking opioid medicine for more than a few weeks, you may need to slowly stop taking them (taper). Tapering your use of opioids can decrease your chances of experiencing withdrawal symptoms, such as:  Abdominal pain and cramping.  Nausea.  Sweating.  Sleepiness.  Restlessness.  Uncontrollable shaking (tremors).  Cravings for the medicine. Do not attempt to taper your use of opioids on your own. Talk with your health care provider about how to do this. Your health care provider may prescribe a step-down schedule based on how much medicine you are taking and how long you have been taking  it. What are the benefits of stopping the use of opioids? By switching from opioid pain medicine to non-opioid pain management options, you will decrease your risk of accidents and injuries associated with long-term opioid use. You will also be able to:  Monitor your pain more accurately and know when to seek medical care if it is not improving.  Decrease risk to others around you. Having  opioids in the home increases the risk for accidental or intentional use or overdose by others. How can I treat pain without opioids? Pain can be managed with many types of alternative treatments. Ask your health care provider to refer you to one or more specialists who can help you manage pain through:  Physical or occupational therapy.  Counseling (cognitive-behavioral therapy).  Good nutrition.  Biofeedback.  Massage.  Meditation.  Non-opioid medicine.  Following a gentle exercise program. Where can I get support? If you have been taking opioids for a long time, you may benefit from receiving support for quitting from a local support group or counselor. Ask your health care provider for a referral to these resources in your area. When should I seek medical care? Seek medical care right away if you are taking opioids and you experience any of the following:  Difficulty breathing.  Breathing that is more shallow or slower than normal.  A very slow heartbeat (pulse).  Severe confusion.  Unconsciousness.  Sleepiness.  Difficulty waking from sleep.  Slurred speech.  Nausea and vomiting.  Cold, clammy skin.  Blue lips or fingernails.  Limpness.  Abnormally small pupils. If you think that you or someone else may have taken too much of an opioid medicine, get medical help right away. Do not wait to see if the symptoms go away on their own. Call your local emergency services (911 in the [place] or call the hotline of the [facility] (604)333-3944 in the [place].).  Where can I get more information? To learn more about opioid medicines, visit the Centers for Disease Control and Prevention web site Opioid Basics at https://keller-santana.com/. Summary  Opioid medicines can help you manage moderate-to-severe pain for a short period of time.  Taking opioid pain medicine for a long period of time puts you at risk for unintentional  accidents, injury, and even death.  If you think that you or someone else may have taken too much of an opioid, get medical help right away. This information is not intended to replace advice given to you by your health care provider. Make sure you discuss any questions you have with your health care provider. Document Released: 01/05/2016 Document Revised: 08/02/2016 Document Reviewed: 07/21/2015 Elsevier Interactive Patient Education  2017 [employer].

## 2020-03-13 NOTE — Progress Notes (Signed)
Chief Complaint  Patient presents with  . Knee Pain    right / swelling pain    Body mass index is 26.54 kg/m. BP (!) 144/92   Pulse 83   Ht 5\' 10"  (1.778 m)   Wt 185 lb (83.9 kg)   BMI 26.54 kg/m    Pain swelling right knee  Encounter Diagnoses  Name Primary?  . Chronic pain of right knee   . Effusion of right knee Yes    Post knee arthroscopy complicated by infection had to have a lavage  Has chronic arthritis in the knee chronic knee pain presents c/o giving out after working on some tress limbs  Swelling cant go down the stairs sequentially  Past Medical History:  Diagnosis Date  . Acid reflux   . Alcohol abuse    6-8 cans of beer daily; hx of incarceration for DWI  . Arthritis   . Bipolar disorder ([place])   . Bronchitis   . Chronic back pain   . Chronic neck pain   . Chronic pain   . Diabetes mellitus without complication ([place])   . Fracture of lower leg   . Gout   . Hyperlipidemia   . Hypertension   . Left arm pain    chronic  . Migraine   . Pancreatitis    March 2013  . Pseudocyst of pancreas 02/28/2012  . Substance abuse [facility])    Physical Exam Vitals and nursing note reviewed.  Constitutional:      Appearance: Normal appearance.  Musculoskeletal:     Right knee: Swelling, effusion and crepitus present. No deformity, erythema or ecchymosis. Decreased range of motion. Tenderness present over the medial joint line and lateral joint line. No ACL laxity or PCL laxity.  Neurological:     Mental Status: He is alert and oriented to person, place, and time.  Psychiatric:        Mood and Affect: Mood normal.      Procedure note injection and aspiration right knee joint  Verbal consent was obtained to aspirate and inject the right knee joint   Timeout was completed to confirm the site of aspiration and injection  An 18-gauge needle was used to aspirate the knee joint from a suprapatellar lateral approach.  The medications used were 40 mg of  Depo-Medrol and 1% lidocaine 3 cc  Anesthesia was provided by ethyl chloride and the skin was prepped with alcohol.  After cleaning the skin with alcohol an 18-gauge needle was used to aspirate the right knee joint.  We obtained 30  cc of fluid clear yellow  We follow this by injection of 40 mg of Depo-Medrol and 3 cc 1% lidocaine.  There were no complications. A sterile bandage was applied.  Encounter Diagnoses  Name Primary?  . Chronic pain of right knee   . Effusion of right knee Yes    Meds ordered this encounter  Medications  . oxycodone (OXY-IR) 5 MG capsule    Sig: Take 1 capsule (5 mg total) by mouth every 2 (two) hours as needed.    Dispense:  42 capsule    Refill:  0   Bigger brace : playmaker    Fu 3 months

## 2020-03-22 ENCOUNTER — Other Ambulatory Visit: Payer: Self-pay | Admitting: Orthopedic Surgery

## 2020-03-28 ENCOUNTER — Other Ambulatory Visit: Payer: Self-pay | Admitting: Orthopedic Surgery

## 2020-04-04 ENCOUNTER — Other Ambulatory Visit: Payer: Self-pay | Admitting: Orthopedic Surgery

## 2020-04-11 ENCOUNTER — Other Ambulatory Visit: Payer: Self-pay | Admitting: Orthopedic Surgery

## 2020-04-18 ENCOUNTER — Other Ambulatory Visit: Payer: Self-pay | Admitting: Orthopedic Surgery

## 2020-04-25 ENCOUNTER — Other Ambulatory Visit: Payer: Self-pay | Admitting: Orthopedic Surgery

## 2020-05-02 ENCOUNTER — Other Ambulatory Visit: Payer: Self-pay | Admitting: Orthopedic Surgery

## 2020-05-09 ENCOUNTER — Other Ambulatory Visit: Payer: Self-pay | Admitting: Orthopedic Surgery

## 2020-05-16 ENCOUNTER — Other Ambulatory Visit: Payer: Self-pay | Admitting: Orthopedic Surgery

## 2020-05-23 ENCOUNTER — Other Ambulatory Visit: Payer: Self-pay | Admitting: Orthopedic Surgery

## 2020-05-31 ENCOUNTER — Other Ambulatory Visit: Payer: Self-pay | Admitting: Orthopedic Surgery

## 2020-06-07 ENCOUNTER — Other Ambulatory Visit: Payer: Self-pay | Admitting: Orthopedic Surgery

## 2020-06-07 NOTE — Telephone Encounter (Signed)
Patient called for refill: oxyCODONE (OXY IR/ROXICODONE) 5 MG immediate release tablet 28 tablet  [facility]  

## 2020-06-08 MED ORDER — OXYCODONE HCL 5 MG PO TABS: 5.0000 mg | ORAL_TABLET | Freq: Four times a day (QID) | ORAL | 0 refills | Status: DC | PRN

## 2020-06-14 ENCOUNTER — Ambulatory Visit: Payer: Medicare Other | Admitting: Orthopedic Surgery

## 2020-06-20 ENCOUNTER — Other Ambulatory Visit: Payer: Self-pay | Admitting: Orthopedic Surgery

## 2020-06-28 ENCOUNTER — Other Ambulatory Visit: Payer: Self-pay | Admitting: Orthopedic Surgery

## 2020-06-28 DIAGNOSIS — G8929 Other chronic pain: Secondary | ICD-10-CM

## 2020-07-05 ENCOUNTER — Other Ambulatory Visit: Payer: Self-pay | Admitting: Orthopedic Surgery

## 2020-07-12 ENCOUNTER — Other Ambulatory Visit: Payer: Self-pay | Admitting: Orthopedic Surgery

## 2020-07-18 ENCOUNTER — Other Ambulatory Visit: Payer: Self-pay | Admitting: Orthopedic Surgery

## 2020-07-25 ENCOUNTER — Other Ambulatory Visit: Payer: Self-pay | Admitting: Orthopedic Surgery

## 2020-07-25 DIAGNOSIS — G8929 Other chronic pain: Secondary | ICD-10-CM

## 2020-08-01 ENCOUNTER — Other Ambulatory Visit: Payer: Self-pay | Admitting: Orthopedic Surgery

## 2020-08-08 ENCOUNTER — Other Ambulatory Visit: Payer: Self-pay | Admitting: Orthopedic Surgery

## 2020-08-09 ENCOUNTER — Emergency Department (HOSPITAL_COMMUNITY)
Admission: EM | Admit: 2020-08-09 | Discharge: 2020-08-10 | Discharge status: Against Medical Advice | Payer: Medicare Other | Attending: Emergency Medicine | Admitting: Emergency Medicine

## 2020-08-09 ENCOUNTER — Encounter (HOSPITAL_COMMUNITY): Payer: Self-pay | Admitting: Emergency Medicine

## 2020-08-09 ENCOUNTER — Other Ambulatory Visit: Payer: Self-pay

## 2020-08-09 DIAGNOSIS — I10 Essential (primary) hypertension: Secondary | ICD-10-CM | POA: Insufficient documentation

## 2020-08-09 DIAGNOSIS — Z79899 Other long term (current) drug therapy: Secondary | ICD-10-CM | POA: Diagnosis not present

## 2020-08-09 DIAGNOSIS — Z96642 Presence of left artificial hip joint: Secondary | ICD-10-CM | POA: Insufficient documentation

## 2020-08-09 DIAGNOSIS — Z7984 Long term (current) use of oral hypoglycemic drugs: Secondary | ICD-10-CM | POA: Diagnosis not present

## 2020-08-09 DIAGNOSIS — F17210 Nicotine dependence, cigarettes, uncomplicated: Secondary | ICD-10-CM | POA: Insufficient documentation

## 2020-08-09 DIAGNOSIS — E119 Type 2 diabetes mellitus without complications: Secondary | ICD-10-CM | POA: Insufficient documentation

## 2020-08-09 DIAGNOSIS — T63481A Toxic effect of venom of other arthropod, accidental (unintentional), initial encounter: Secondary | ICD-10-CM

## 2020-08-09 DIAGNOSIS — R220 Localized swelling, mass and lump, head: Secondary | ICD-10-CM

## 2020-08-09 DIAGNOSIS — T63441A Toxic effect of venom of bees, accidental (unintentional), initial encounter: Secondary | ICD-10-CM | POA: Insufficient documentation

## 2020-08-09 MED ORDER — HYDROCODONE-ACETAMINOPHEN 5-325 MG PO TABS
1.0000 | ORAL_TABLET | Freq: Once | ORAL | Status: AC
  Administered 2020-08-09: 1 via ORAL
  Filled 2020-08-09: qty 1

## 2020-08-09 MED ORDER — METHYLPREDNISOLONE SODIUM SUCC 125 MG IJ SOLR
125.0000 mg | Freq: Once | INTRAMUSCULAR | Status: AC
  Administered 2020-08-09: 125 mg via INTRAVENOUS
  Filled 2020-08-09: qty 2

## 2020-08-09 NOTE — ED Notes (Signed)
Pt given another coke, peanut butter, crackers per request-DR Melina Copa ok

## 2020-08-09 NOTE — Discharge Instructions (Signed)
You were seen in the emergency department for facial swelling after getting stung in the face by bees.  He was given medications her for period of time with improvement in your symptoms.  Please use ice to the affected area.  Tylenol or ibuprofen for pain.  Follow-up with your doctor.  Return to the emergency department for any worsening or concerning symptoms.  Please continue Benadryl 1 to 2 tablets every 4-6 hours as needed for itching.

## 2020-08-09 NOTE — ED Provider Notes (Signed)
[facility] EMERGENCY DEPARTMENT Provider Note   CSN: [id number] Arrival date & time: 08/09/20  2125     History Chief Complaint  Patient presents with  . Angioedema    [name] [name]. is a 59 y.o. male.  He is complaining of facial swelling after getting stung multiple times by  bees while he was cutting the grass.  He was given IV Benadryl and IV epinephrine.  Complaining of facial pain and swelling.  Some pain with swallowing.  No rash.  No shortness of breath.  Admits to a few beers tonight.  History of allergic reaction to stings.  The history is provided by the patient.  Allergic Reaction Presenting symptoms: difficulty swallowing and swelling   Presenting symptoms: no rash   Severity:  Moderate Duration:  30 minutes Prior allergic episodes:  Insect allergies Context: insect bite/sting   Relieved by:  Nothing Worsened by:  Nothing Ineffective treatments:  None tried      Past Medical History:  Diagnosis Date  . Acid reflux   . Alcohol abuse    6-8 cans of beer daily; hx of incarceration for DWI  . Arthritis   . Bipolar disorder ([place])   . Bronchitis   . Chronic back pain   . Chronic neck pain   . Chronic pain   . Diabetes mellitus without complication ([place])   . Fracture of lower leg   . Gout   . Hyperlipidemia   . Hypertension   . Left arm pain    chronic  . Migraine   . Pancreatitis    March 2013  . Pseudocyst of pancreas 02/28/2012  . Substance abuse [facility])     Patient Active Problem List   Diagnosis Date Noted  . Septic arthritis ([place]) 08/09/2019  . S/P right knee arthroscopy 07/29/2019 08/03/2019  . Acute lateral meniscus tear of right knee   . Acute medial meniscus tear of right knee   . Substance use disorder 03/16/2019  . History of diabetes mellitus 10/10/2016  . Avascular necrosis of hip, left ([place]) 10/04/2016  . Status post left hip replacement 10/04/2016  . Rash and nonspecific skin eruption 08/12/2013  . Cholelithiases 08/04/2013  .  Subcutaneous nodule 07/21/2013  . Acute alcoholic pancreatitis 78/29/5621  . Syncope 04/11/2013  . Prolonged Q-T interval on ECG 04/11/2013  . Cocaine abuse ([place]) 03/13/2013    Class: Chronic  . At risk for adverse drug event 02/14/2013  . DDD (degenerative disc disease), lumbar 02/01/2013  . Dyspepsia 12/01/2012  . BPH (benign prostatic hyperplasia) 10/07/2012  . Allergic rhinitis 10/03/2012  . Transaminitis 10/02/2012  . Chronic pain syndrome 10/02/2012  . Essential hypertension, benign 07/01/2012  . Tobacco user 07/01/2012  . Hepatic steatosis 03/06/2012  . ED (erectile dysfunction) 03/06/2012  . Pseudocyst of pancreas 02/28/2012  . Alcohol abuse 02/25/2012  . GERD (gastroesophageal reflux disease) 02/23/2012  . Bipolar 1 disorder ([place]) 02/23/2012  . Hypertriglyceridemia 12/05/2011    Past Surgical History:  Procedure Laterality Date  . CIRCUMCISION  03/17/2012   Procedure: CIRCUMCISION ADULT;  Surgeon: Marissa Nestle, MD;  Location: [facility];  Service: Urology;  Laterality: N/A;  . COLONOSCOPY WITH PROPOFOL  12/24/2012   HYQ:MVHQ lesion as described above status post biopsy; otherwise normal rectum/Sigmoid diverticulosis/Sigmoid polyps -- resected as described above. anal lesion, benign. colon polyp, hyperplastic  . ESOPHAGOGASTRODUODENOSCOPY (EGD) WITH PROPOFOL  12/24/2012   ION:GEXBMWU erythema and erosions of uncertain significance status post gastric biopsy (reactive gastropathy NO h.pylori)  . KNEE  ARTHROSCOPY Right 08/09/2019   Procedure: arthroscopic incision and drainage snovectomy;  Surgeon: Paralee Cancel, MD;  Location: [facility];  Service: Orthopedics;  Laterality: Right;  . KNEE ARTHROSCOPY WITH MEDIAL MENISECTOMY Right 07/29/2019   Procedure: RIGHT KNEE ARTHROSCOPY WITH MEDIAL MENISCECTOMY AND LATERAL MENISCECTOMY;  Surgeon: Carole Civil, MD;  Location: [facility];  Service: Orthopedics;  Laterality: Right;  . NOSE SURGERY     broken nose  . TOTAL HIP ARTHROPLASTY Left  10/04/2016   Procedure: LEFT TOTAL HIP ARTHROPLASTY ANTERIOR APPROACH;  Surgeon: Mcarthur Rossetti, MD;  Location: [facility];  Service: Orthopedics;  Laterality: Left;       Family History  Problem Relation Age of Onset  . Diabetes Father   . Healthy Mother   . Anesthesia problems Neg Hx   . Hypotension Neg Hx   . Malignant hyperthermia Neg Hx   . Pseudochol deficiency Neg Hx   . Colon cancer Neg Hx   . Heart disease Neg Hx   . Stroke Neg Hx   . Cancer Neg Hx     Social History   Tobacco Use  . Smoking status: Current Every Day Smoker    Packs/day: 0.50    Years: 25.00    Pack years: 12.50    Types: Cigarettes  . Smokeless tobacco: Former Systems developer    Types: Secondary school teacher  . Vaping Use: Never used  Substance Use Topics  . Alcohol use: Yes    Comment: once a week  . Drug use: Not Currently    Types: Cocaine, Marijuana    Comment: 06/26/19 - Cocaine    Home Medications Prior to Admission medications   Medication Sig Start Date End Date Taking? Authorizing Provider  amLODipine (NORVASC) 5 MG tablet Take 5 mg by mouth daily.    [provider]  celecoxib (CELEBREX) 100 MG capsule Take 1 capsule (100 mg total) by mouth daily as needed for moderate pain. 11/22/19   Carole Civil, MD  citalopram (CELEXA) 20 MG tablet Take 1 tablet by mouth daily. 02/16/19   [provider]  fluticasone (FLONASE) 50 MCG/ACT nasal spray Place 2 sprays into both nostrils daily as needed for allergies.     [provider]  gabapentin (NEURONTIN) 400 MG capsule TAKE (1) CAPSULE BY MOUTH AT BEDTIME. 07/25/20   Carole Civil, MD  guaifenesin (MUCUS RELIEF) 400 MG TABS tablet Take 400 mg by mouth daily.    [provider]  HYDROcodone-acetaminophen (NORCO) 7.5-325 MG tablet TAKE 1 TABLET BY MOUTH EVERY 8 HOURS AS NEEDED. 03/02/20   Carole Civil, MD  levocetirizine (XYZAL) 5 MG tablet Take 5 mg by mouth every evening.    [provider]   lisinopril (PRINIVIL,ZESTRIL) 10 MG tablet Take 10 mg by mouth daily.  01/30/18   [provider]  lovastatin (MEVACOR) 40 MG tablet Take 1 tablet by mouth daily. 06/30/19   [provider]  metFORMIN (GLUCOPHAGE) 850 MG tablet Take 850 mg by mouth daily with breakfast.  01/30/18   [provider]  omeprazole (PRILOSEC) 40 MG capsule Take 40 mg by mouth daily.  01/30/18   [provider]  oxyCODONE (OXY IR/ROXICODONE) 5 MG immediate release tablet TAKE 1 TABLET BY MOUTH EVERY 6 HOURS AS NEEDED. 08/08/20   Carole Civil, MD  oxycodone (OXY-IR) 5 MG capsule Take 1 capsule (5 mg total) by mouth every 2 (two) hours as needed. 03/13/20   Carole Civil, MD  tamsulosin [facility])  0.4 MG CAPS capsule Take 1 capsule by mouth daily. 06/30/19   [provider]  traZODone (DESYREL) 100 MG tablet Take 1-2 tablets by mouth at bedtime as needed. 02/15/19   [provider]    Allergies    Bee venom and Penicillins  Review of Systems   Review of Systems  Constitutional: Negative for fever.  HENT: Positive for facial swelling and trouble swallowing. Negative for sore throat.   Eyes: Negative for visual disturbance.  Respiratory: Negative for shortness of breath.   Cardiovascular: Negative for chest pain.  Gastrointestinal: Negative for abdominal pain.  Genitourinary: Negative for dysuria.  Musculoskeletal: Positive for neck pain.  Skin: Negative for rash.  Neurological: Negative for headaches.    Physical Exam Updated Vital Signs BP 129/73   Pulse 76   Temp 98.5 F (36.9 C) (Oral)   Resp 19   Ht 5\' 10"  (1.778 m)   Wt 83.5 kg   SpO2 94%   BMI 26.40 kg/m   Physical Exam Vitals and nursing note reviewed.  Constitutional:      Appearance: Normal appearance. He is well-developed.  HENT:     Head: Normocephalic.     Comments: Some swelling around his lips and cheek on the right.  No trismus.  No tongue swelling.  Normal voice.  No  stridor. Eyes:     Conjunctiva/sclera: Conjunctivae normal.  Cardiovascular:     Rate and Rhythm: Normal rate and regular rhythm.     Heart sounds: No murmur heard.   Pulmonary:     Effort: Pulmonary effort is normal. No respiratory distress.     Breath sounds: Normal breath sounds.  Abdominal:     Palpations: Abdomen is soft.     Tenderness: There is no abdominal tenderness.  Musculoskeletal:        General: No deformity. Normal range of motion.     Cervical back: Neck supple.  Skin:    General: Skin is warm and dry.     Capillary Refill: Capillary refill takes less than 2 seconds.  Neurological:     General: No focal deficit present.     Mental Status: He is alert.       ED Results / Procedures / Treatments   Labs (all labs ordered are listed, but only abnormal results are displayed) Labs Reviewed - No data to display  EKG None  Radiology No results found.  Procedures Procedures (including critical care time)  Medications Ordered in ED Medications  methylPREDNISolone sodium succinate (SOLU-MEDROL) 125 mg/2 mL injection 125 mg (has no administration in time range)    ED Course  I have reviewed the triage vital signs and the nursing notes.  Pertinent labs & imaging results that were available during my care of the patient were reviewed by me and considered in my medical decision making (see chart for details).  Clinical Course as of Aug 11 1155  Wed Aug 09, 2020  2223 Reassessed patient, he feels like the swelling is improving.   [MB]  2324 Signed out to oncoming provider Dr. Waverly Ferrari to reassess the patient and likely discharge if continues to improve.   [MB]    Clinical Course User Index [MB] Hayden Rasmussen, MD   MDM Rules/Calculators/A&P                         Patient is here with multiple bee stings to face.  He was given Benadryl and epinephrine by EMS.  Have  given him steroids.  Have held off on Pepcid as he has mild QTC prolongation.  He is also  on an ACE inhibitor and angioedema is in the differential.  He clearly states that he was stung by bees to his face I think this is more allergic reaction mostly local.  He is eating and drinking in the department in no distress.  Anticipate can be discharged after a little longer observation.  Final Clinical Impression(s) / ED Diagnoses Final diagnoses:  Allergic reaction to insect sting, accidental or unintentional, initial encounter  Right facial swelling    Rx / DC Orders ED Discharge Orders    None       Hayden Rasmussen, MD 08/10/20 1157

## 2020-08-09 NOTE — ED Triage Notes (Addendum)
Pt was mowing and got stung by atleast 7 bees. Pt presents with swelling to right side of face and upper lip. Pt received 0.5mg  epi IV and 50mg  benadryl IV in route. ETOH on board.

## 2020-08-10 NOTE — ED Notes (Signed)
Pt upset and cussing at staff at this time. Pt given multiple snacks and drinks, upset that we haven't give him more. Attempted to explain to pt but he is unable to reason with at this time. Security called to room due to pt cussing and threatening staff.

## 2020-08-10 NOTE — ED Notes (Signed)
Pt advised he was leaving AMA- pt refused to sign- pt escorted out by security.

## 2020-08-15 ENCOUNTER — Other Ambulatory Visit: Payer: Self-pay | Admitting: Orthopedic Surgery

## 2020-08-22 ENCOUNTER — Other Ambulatory Visit: Payer: Self-pay | Admitting: Orthopedic Surgery

## 2020-08-24 ENCOUNTER — Ambulatory Visit: Payer: Medicare Other | Admitting: Orthopedic Surgery

## 2020-08-29 ENCOUNTER — Other Ambulatory Visit: Payer: Self-pay | Admitting: Orthopedic Surgery

## 2020-09-12 ENCOUNTER — Other Ambulatory Visit: Payer: Self-pay | Admitting: Orthopedic Surgery

## 2020-09-20 ENCOUNTER — Other Ambulatory Visit: Payer: Self-pay | Admitting: Orthopedic Surgery

## 2020-09-21 ENCOUNTER — Ambulatory Visit (INDEPENDENT_AMBULATORY_CARE_PROVIDER_SITE_OTHER): Payer: Medicare Other | Admitting: Orthopedic Surgery

## 2020-09-21 ENCOUNTER — Other Ambulatory Visit: Payer: Self-pay

## 2020-09-21 ENCOUNTER — Encounter: Payer: Self-pay | Admitting: Orthopedic Surgery

## 2020-09-21 DIAGNOSIS — E1140 Type 2 diabetes mellitus with diabetic neuropathy, unspecified: Secondary | ICD-10-CM

## 2020-09-21 DIAGNOSIS — M00861 Arthritis due to other bacteria, right knee: Secondary | ICD-10-CM | POA: Diagnosis not present

## 2020-09-21 DIAGNOSIS — F319 Bipolar disorder, unspecified: Secondary | ICD-10-CM | POA: Diagnosis not present

## 2020-09-21 DIAGNOSIS — M00869 Arthritis due to other bacteria, unspecified knee: Secondary | ICD-10-CM

## 2020-09-21 DIAGNOSIS — M25461 Effusion, right knee: Secondary | ICD-10-CM

## 2020-09-21 MED ORDER — OXYCODONE HCL 5 MG PO TABS
5.0000 mg | ORAL_TABLET | Freq: Four times a day (QID) | ORAL | 0 refills | Status: DC | PRN
Start: 2020-09-21 — End: 2020-10-02

## 2020-09-21 NOTE — Progress Notes (Signed)
Chief Complaint  Patient presents with  . Knee Pain    right knee pain, fluid on knee.    58 year old man with chronic right knee pain had an arthroscopy developed a postop infection had to have a arthroscopic incision drainage and lavage and now has chronic pain currently managed with oxycodone  He thinks he has some fluid on his knee  Right knee exam no warmth or erythema  May have a trace effusion with the wave test  Range of motion 0-1 20 knee feels stable  Aspiration attempted no fluid settled on injecting.  Procedure note right knee injection   verbal consent was obtained to inject right knee joint  Timeout was completed to confirm the site of injection  The medications used were 40 mg of Depo-Medrol and 1% lidocaine 3 cc  Anesthesia was provided by ethyl chloride and the skin was prepped with alcohol.  After cleaning the skin with alcohol a 20-gauge needle was used to inject the right knee joint. There were no complications. A sterile bandage was applied.   Return 3 months for chronic pain management  Meds ordered this encounter  Medications  . oxyCODONE (OXY IR/ROXICODONE) 5 MG immediate release tablet    Sig: Take 1 tablet (5 mg total) by mouth every 6 (six) hours as needed.    Dispense:  28 tablet    Refill:  0

## 2020-09-21 NOTE — Patient Instructions (Signed)

## 2020-09-23 ENCOUNTER — Other Ambulatory Visit: Payer: Self-pay | Admitting: Orthopedic Surgery

## 2020-09-23 DIAGNOSIS — G8929 Other chronic pain: Secondary | ICD-10-CM

## 2020-09-27 ENCOUNTER — Other Ambulatory Visit: Payer: Self-pay | Admitting: Orthopedic Surgery

## 2020-09-29 NOTE — Telephone Encounter (Signed)
Rx refill request

## 2020-10-13 ENCOUNTER — Other Ambulatory Visit: Payer: Self-pay | Admitting: Orthopedic Surgery

## 2020-11-02 ENCOUNTER — Encounter: Payer: Self-pay | Admitting: Orthopedic Surgery

## 2020-11-02 ENCOUNTER — Ambulatory Visit: Payer: Medicare Other

## 2020-11-02 ENCOUNTER — Other Ambulatory Visit: Payer: Self-pay

## 2020-11-02 ENCOUNTER — Other Ambulatory Visit: Payer: Self-pay | Admitting: Orthopedic Surgery

## 2020-11-02 ENCOUNTER — Ambulatory Visit (INDEPENDENT_AMBULATORY_CARE_PROVIDER_SITE_OTHER): Payer: Medicare Other | Admitting: Orthopedic Surgery

## 2020-11-02 VITALS — BP 128/78 | HR 88 | Ht 70.0 in | Wt 174.0 lb

## 2020-11-02 DIAGNOSIS — M25511 Pain in right shoulder: Secondary | ICD-10-CM

## 2020-11-02 MED ORDER — GABAPENTIN 400 MG PO CAPS: 400.0000 mg | ORAL_CAPSULE | Freq: Three times a day (TID) | ORAL | 5 refills | Status: DC
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
## 2020-11-02 NOTE — Patient Instructions (Signed)
Schedule MRI  You have received an injection of steroids into the joint. 15% of patients will have increased pain within the 24 hours postinjection.   This is transient and will go away.   We recommend that you use ice packs on the injection site for 20 minutes every 2 hours and extra strength Tylenol 2 tablets every 8 as needed until the pain resolves.  If you continue to have pain after taking the Tylenol and using the ice please call the office for further instructions.

## 2020-11-11 ENCOUNTER — Other Ambulatory Visit: Payer: Self-pay

## 2020-11-11 ENCOUNTER — Ambulatory Visit (HOSPITAL_COMMUNITY)
Admission: EM | Admit: 2020-11-11 | Discharge: 2020-11-11 | Disposition: A | Discharge status: Rehabilitation Facility | Payer: Medicare Other | Attending: Behavioral Health | Admitting: Behavioral Health

## 2020-11-11 ENCOUNTER — Emergency Department (HOSPITAL_COMMUNITY)
Admission: EM | Admit: 2020-11-11 | Discharge: 2020-11-11 | Disposition: A | Discharge status: Home / Self Care | Payer: Medicare Other | Attending: Emergency Medicine | Admitting: Emergency Medicine

## 2020-11-11 DIAGNOSIS — K852 Alcohol induced acute pancreatitis without necrosis or infection: Secondary | ICD-10-CM

## 2020-11-11 DIAGNOSIS — E1140 Type 2 diabetes mellitus with diabetic neuropathy, unspecified: Secondary | ICD-10-CM | POA: Insufficient documentation

## 2020-11-11 DIAGNOSIS — I10 Essential (primary) hypertension: Secondary | ICD-10-CM | POA: Diagnosis not present

## 2020-11-11 DIAGNOSIS — F209 Schizophrenia, unspecified: Secondary | ICD-10-CM | POA: Insufficient documentation

## 2020-11-11 DIAGNOSIS — F1910 Other psychoactive substance abuse, uncomplicated: Secondary | ICD-10-CM

## 2020-11-11 DIAGNOSIS — Z7984 Long term (current) use of oral hypoglycemic drugs: Secondary | ICD-10-CM | POA: Insufficient documentation

## 2020-11-11 DIAGNOSIS — Z79899 Other long term (current) drug therapy: Secondary | ICD-10-CM | POA: Diagnosis not present

## 2020-11-11 DIAGNOSIS — F17210 Nicotine dependence, cigarettes, uncomplicated: Secondary | ICD-10-CM | POA: Diagnosis not present

## 2020-11-11 DIAGNOSIS — Z96642 Presence of left artificial hip joint: Secondary | ICD-10-CM | POA: Diagnosis not present

## 2020-11-11 DIAGNOSIS — Z20822 Contact with and (suspected) exposure to covid-19: Secondary | ICD-10-CM | POA: Insufficient documentation

## 2020-11-11 LAB — RAPID URINE DRUG SCREEN, HOSP PERFORMED
Amphetamines: NOT DETECTED
Barbiturates: NOT DETECTED
Benzodiazepines: NOT DETECTED
Cocaine: POSITIVE — AB
Opiates: NOT DETECTED
Tetrahydrocannabinol: POSITIVE — AB

## 2020-11-11 LAB — CBC
HCT: 45.8 % (ref 39.0–52.0)
Hemoglobin: 15.4 g/dL (ref 13.0–17.0)
MCH: 31.6 pg (ref 26.0–34.0)
MCHC: 33.6 g/dL (ref 30.0–36.0)
MCV: 93.9 fL (ref 80.0–100.0)
Platelets: 388 10*3/uL (ref 150–400)
RBC: 4.88 MIL/uL (ref 4.22–5.81)
RDW: 14.6 % (ref 11.5–15.5)
WBC: 6.4 10*3/uL (ref 4.0–10.5)
nRBC: 0 % (ref 0.0–0.2)

## 2020-11-11 LAB — COMPREHENSIVE METABOLIC PANEL
ALT: 22 U/L (ref 0–44)
AST: 35 U/L (ref 15–41)
Albumin: 4.2 g/dL (ref 3.5–5.0)
Alkaline Phosphatase: 30 U/L — ABNORMAL LOW (ref 38–126)
Anion gap: 10 (ref 5–15)
BUN: 13 mg/dL (ref 6–20)
CO2: 25 mmol/L (ref 22–32)
Calcium: 8.7 mg/dL — ABNORMAL LOW (ref 8.9–10.3)
Chloride: 101 mmol/L (ref 98–111)
Creatinine, Ser: 0.79 mg/dL (ref 0.61–1.24)
GFR, Estimated: >60 mL/min (ref >60)
Glucose, Bld: 108 mg/dL — ABNORMAL HIGH (ref 70–99)
Potassium: 3.7 mmol/L (ref 3.5–5.1)
Sodium: 136 mmol/L (ref 135–145)
Total Bilirubin: 0.8 mg/dL (ref 0.3–1.2)
Total Protein: 7.3 g/dL (ref 6.5–8.1)

## 2020-11-11 LAB — POC SARS CORONAVIRUS 2 AG: SARS Coronavirus 2 Ag: NEGATIVE

## 2020-11-11 LAB — POC SARS CORONAVIRUS 2 AG -  ED: SARS Coronavirus 2 Ag: NEGATIVE

## 2020-11-11 LAB — ETHANOL: Alcohol, Ethyl (B): <10 mg/dL (ref <10)

## 2020-11-11 NOTE — ED Provider Notes (Signed)
[facility] Medical Screening Exam  Patient Name: [name] [name]. MRN: [id number] Date of Evaluation: 11/11/20 Chief Complaint:  Requesting inpatient for substance use Diagnosis: Schizophrenia   History of Present illness: [name] [name]. is a 59 y.o. male with a cc of wanting substance rehab.  The patient states he is addicted to alcohol cocaine and marijuana. The patient discussed this evening that he is ready to stop using and get his life back on track.  No specific reason why he would do it today.  He discussed that he wants to connect back with his family. Denies suicidal or homicidal ideation. He denies A/V/H. Denies chest pain shortness of breath denies abdominal pain.  Denies any withdrawal symptoms.  Last did cocaine and marijuana couple days ago last drank this morning.  Psychiatric Specialty Exam  Presentation  General Appearance:Appropriate for Environment;Casual  Eye Contact:Good  Speech:Clear and Coherent  Speech Volume:Normal  Handedness:Right   Mood and Affect  Mood:Euthymic  Affect:Appropriate   Thought Process  Thought Processes:Coherent  Descriptions of Associations:Intact  Orientation:Full (Time, Place and Person)  Thought Content:Logical  Hallucinations:None  Ideas of Reference:None  Suicidal Thoughts:No  Homicidal Thoughts:No   Sensorium  Memory:Immediate Good;Recent Good;Remote Good  Judgment:Good  Insight:Good   Executive Functions  Concentration:Good  Attention Span:Good  [place] recorded Language:Good   Psychomotor Activity  Psychomotor Activity:Normal   Assets  Assets:Communication Skills;Housing;Financial Resources/Insurance;Resilience;Social Support   Sleep  Sleep:Good  Number of hours: 6   Physical Exam: Physical Exam Vitals and nursing note reviewed.  Constitutional:      Appearance: Normal appearance. He is normal weight.  HENT:     Right Ear:  Tympanic membrane normal.     Left Ear: Tympanic membrane normal.     Nose: Nose normal.  Cardiovascular:     Rate and Rhythm: Bradycardia present.  Pulmonary:     Effort: Pulmonary effort is normal.  Musculoskeletal:        General: Normal range of motion.     Cervical back: Normal range of motion and neck supple.  Neurological:     General: No focal deficit present.     Mental Status: He is alert and oriented to person, place, and time. Mental status is at baseline.  Psychiatric:        Attention and Perception: Attention and perception normal.        Mood and Affect: Mood and affect normal.        Speech: Speech normal.        Behavior: Behavior normal.        Thought Content: Thought content normal.        Cognition and Memory: Cognition and memory normal.        Judgment: Judgment normal.    Review of Systems  Psychiatric/Behavioral: Positive for substance abuse.  All other systems reviewed and are negative.  Blood pressure (!) 143/102, pulse (!) 58, temperature 98.9 F (37.2 C), temperature source Oral, resp. rate 20, height 5\' 10"  (1.778 m), weight 184 lb 15.5 oz (83.9 kg), SpO2 100 %. Body mass index is 26.54 kg/m.  Musculoskeletal: Strength & Muscle Tone: within normal limits Gait & Station: normal Patient leans: N/A   [place] [facility] Discharge Disposition for Follow up and Recommendations: Based on my evaluation the patient does not appear to have an emergency medical condition and can be discharged with resources and follow up care in outpatient services for Medication Management and Substance used inpatient treatment.  Caroline Sauger, NP 11/11/2020, 8:24 PM

## 2020-11-11 NOTE — ED Triage Notes (Signed)
Pt presents as walk-in requesting residential tx help for ETOH and cocaine addiction. Denies SI/HI/AVH. TTS Marianna Fuss) notified [facility] to inquire about beds and referral criteria for inpatient stay. [facility] coordinator stated to see if provider at [facility] would perform an assessment to get pt in our system, then transfer pt to their facility. Informed on-coming shift RN and TTS of facility's request. Awaiting results. Pt stable in lobby.

## 2020-11-11 NOTE — ED Triage Notes (Signed)
Pt states he wants help with detox and drug and alcohol treatment. States he uses cocaine, marijuana, alcohol and occasionally heroin. Alert and oriented. Denies SI/HI.

## 2020-11-11 NOTE — ED Provider Notes (Addendum)
[place] DEPT Provider Note   CSN: [id number] Arrival date & time: 11/11/20  1046     History Chief Complaint  Patient presents with  . Drug Problem    [name] Tse. is a 59 y.o. male.  59 yo M with a cc of wanting rehab.  Patient states he is addicted to alcohol cocaine and marijuana.  Tells me he is just ready to quit.  No specific reason why he would do it today.  Denies suicidal or homicidal ideation.  Denies chest pain shortness of breath denies abdominal pain.  Denies any withdrawal symptoms.  Last did cocaine and marijuana couple days ago last drank this morning.  The history is provided by the patient.  Drug Problem This is a new problem. The current episode started 2 days ago. The problem occurs constantly. Pertinent negatives include no chest pain, no abdominal pain, no headaches and no shortness of breath. Nothing aggravates the symptoms. Nothing relieves the symptoms. He has tried nothing for the symptoms. The treatment provided no relief.  Illness Severity:  Moderate Onset quality:  Gradual Duration:  2 weeks Timing:  Constant Progression:  Worsening Chronicity:  New Associated symptoms: no abdominal pain, no chest pain, no congestion, no diarrhea, no fever, no headaches, no myalgias, no rash, no shortness of breath and no vomiting        Past Medical History:  Diagnosis Date  . Acid reflux   . Alcohol abuse    6-8 cans of beer daily; hx of incarceration for DWI  . Arthritis   . Bipolar disorder ([place])   . Bronchitis   . Chronic back pain   . Chronic neck pain   . Chronic pain   . Diabetes mellitus without complication ([place])   . Fracture of lower leg   . Gout   . Hyperlipidemia   . Hypertension   . Left arm pain    chronic  . Migraine   . Pancreatitis    March 2013  . Pseudocyst of pancreas 02/28/2012  . Substance abuse [facility])     Patient Active Problem List   Diagnosis Date Noted  . Type 2 diabetes mellitus with  diabetic neuropathy, without long-term current use of insulin ([place]) 09/21/2020  . Septic arthritis ([place]) 08/09/2019  . S/P right knee arthroscopy 07/29/2019 08/03/2019  . Acute lateral meniscus tear of right knee   . Acute medial meniscus tear of right knee   . Substance use disorder 03/16/2019  . History of diabetes mellitus 10/10/2016  . Avascular necrosis of hip, left ([place]) 10/04/2016  . Status post left hip replacement 10/04/2016  . Rash and nonspecific skin eruption 08/12/2013  . Cholelithiases 08/04/2013  . Subcutaneous nodule 07/21/2013  . Acute alcoholic pancreatitis 96/29/5284  . Syncope 04/11/2013  . Prolonged Q-T interval on ECG 04/11/2013  . Cocaine abuse ([place]) 03/13/2013    Class: Chronic  . At risk for adverse drug event 02/14/2013  . DDD (degenerative disc disease), lumbar 02/01/2013  . Dyspepsia 12/01/2012  . BPH (benign prostatic hyperplasia) 10/07/2012  . Allergic rhinitis 10/03/2012  . Transaminitis 10/02/2012  . Chronic pain syndrome 10/02/2012  . Essential hypertension, benign 07/01/2012  . Tobacco user 07/01/2012  . Hepatic steatosis 03/06/2012  . ED (erectile dysfunction) 03/06/2012  . Pseudocyst of pancreas 02/28/2012  . Alcohol abuse 02/25/2012  . GERD (gastroesophageal reflux disease) 02/23/2012  . Bipolar 1 disorder ([place]) 02/23/2012  . Hypertriglyceridemia 12/05/2011    Past Surgical History:  Procedure Laterality Date  .  CIRCUMCISION  03/17/2012   Procedure: CIRCUMCISION ADULT;  Surgeon: Marissa Nestle, MD;  Location: [facility];  Service: Urology;  Laterality: N/A;  . COLONOSCOPY WITH PROPOFOL  12/24/2012   VEL:FYBO lesion as described above status post biopsy; otherwise normal rectum/Sigmoid diverticulosis/Sigmoid polyps -- resected as described above. anal lesion, benign. colon polyp, hyperplastic  . ESOPHAGOGASTRODUODENOSCOPY (EGD) WITH PROPOFOL  12/24/2012   FBP:ZWCHENI erythema and erosions of uncertain significance status post gastric biopsy  (reactive gastropathy NO h.pylori)  . KNEE ARTHROSCOPY Right 08/09/2019   Procedure: arthroscopic incision and drainage snovectomy;  Surgeon: Paralee Cancel, MD;  Location: [facility];  Service: Orthopedics;  Laterality: Right;  . KNEE ARTHROSCOPY WITH MEDIAL MENISECTOMY Right 07/29/2019   Procedure: RIGHT KNEE ARTHROSCOPY WITH MEDIAL MENISCECTOMY AND LATERAL MENISCECTOMY;  Surgeon: Carole Civil, MD;  Location: [facility];  Service: Orthopedics;  Laterality: Right;  . NOSE SURGERY     broken nose  . TOTAL HIP ARTHROPLASTY Left 10/04/2016   Procedure: LEFT TOTAL HIP ARTHROPLASTY ANTERIOR APPROACH;  Surgeon: Mcarthur Rossetti, MD;  Location: [facility];  Service: Orthopedics;  Laterality: Left;       Family History  Problem Relation Age of Onset  . Diabetes Father   . Healthy Mother   . Anesthesia problems Neg Hx   . Hypotension Neg Hx   . Malignant hyperthermia Neg Hx   . Pseudochol deficiency Neg Hx   . Colon cancer Neg Hx   . Heart disease Neg Hx   . Stroke Neg Hx   . Cancer Neg Hx     Social History   Tobacco Use  . Smoking status: Current Every Day Smoker    Packs/day: 0.50    Years: 25.00    Pack years: 12.50    Types: Cigarettes  . Smokeless tobacco: Former Systems developer    Types: Secondary school teacher  . Vaping Use: Never used  Substance Use Topics  . Alcohol use: Yes    Comment: once a week  . Drug use: Not Currently    Types: Cocaine, Marijuana    Comment: 06/26/19 - Cocaine    Home Medications Prior to Admission medications   Medication Sig Start Date End Date Taking? Authorizing Provider  amLODipine (NORVASC) 5 MG tablet Take 5 mg by mouth daily.    [provider]  celecoxib (CELEBREX) 100 MG capsule Take 1 capsule (100 mg total) by mouth daily as needed for moderate pain. 11/22/19   Carole Civil, MD  citalopram (CELEXA) 20 MG tablet Take 1 tablet by mouth daily. Patient not taking: Reported on 11/02/2020 02/16/19   [provider]  cloNIDine  (CATAPRES) 0.2 MG tablet Take 0.2 mg by mouth at bedtime. 10/25/20   [provider]  fluticasone (FLONASE) 50 MCG/ACT nasal spray Place 2 sprays into both nostrils daily as needed for allergies.     [provider]  gabapentin (NEURONTIN) 400 MG capsule Take 1 capsule (400 mg total) by mouth 3 (three) times daily. 11/02/20   Carole Civil, MD  guaifenesin (MUCUS RELIEF) 400 MG TABS tablet Take 400 mg by mouth daily.    [provider]  HYDROcodone-acetaminophen (NORCO) 7.5-325 MG tablet TAKE 1 TABLET BY MOUTH EVERY 8 HOURS AS NEEDED. 03/02/20   Carole Civil, MD  levocetirizine (XYZAL) 5 MG tablet Take 5 mg by mouth every evening.    [provider]  lisinopril (PRINIVIL,ZESTRIL) 10 MG tablet Take 10 mg by mouth daily.  01/30/18   [provider]  lovastatin (MEVACOR) 40 MG tablet Take 1 tablet by mouth daily. 06/30/19   [provider]  metFORMIN (GLUCOPHAGE) 850 MG tablet Take 850 mg by mouth daily with breakfast.  01/30/18   [provider]  omeprazole (PRILOSEC) 40 MG capsule Take 40 mg by mouth daily.  01/30/18   [provider]  oxyCODONE (OXY IR/ROXICODONE) 5 MG immediate release tablet TAKE 1 TABLET BY MOUTH EVERY 6 HOURS AS NEEDED. 11/02/20   Carole Civil, MD  tamsulosin (FLOMAX) 0.4 MG CAPS capsule Take 1 capsule by mouth daily. 06/30/19   [provider]  traZODone (DESYREL) 100 MG tablet Take 1-2 tablets by mouth at bedtime as needed. 02/15/19   [provider]    Allergies    Bee venom and Penicillins  Review of Systems   Review of Systems  Constitutional: Negative for chills and fever.  HENT: Negative for congestion and facial swelling.   Eyes: Negative for discharge and visual disturbance.  Respiratory: Negative for shortness of breath.   Cardiovascular: Negative for chest pain and palpitations.  Gastrointestinal: Negative for abdominal pain, diarrhea and vomiting.   Musculoskeletal: Negative for arthralgias and myalgias.  Skin: Negative for color change and rash.  Neurological: Negative for tremors, syncope and headaches.  Psychiatric/Behavioral: Negative for confusion and dysphoric mood.    Physical Exam Updated Vital Signs BP (!) 130/103   Pulse 88   Temp 98 F (36.7 C) (Oral)   Resp 18   Ht 5\' 10"  (1.778 m)   Wt 83.9 kg   SpO2 99%   BMI 26.54 kg/m   Physical Exam Vitals and nursing note reviewed.  Constitutional:      Appearance: He is well-developed.  HENT:     Head: Normocephalic and atraumatic.  Eyes:     Pupils: Pupils are equal, round, and reactive to light.  Neck:     Vascular: No JVD.  Cardiovascular:     Rate and Rhythm: Normal rate and regular rhythm.     Heart sounds: No murmur heard.  No friction rub. No gallop.   Pulmonary:     Effort: No respiratory distress.     Breath sounds: No wheezing.  Abdominal:     General: There is no distension.     Tenderness: There is no guarding or rebound.  Musculoskeletal:        General: Normal range of motion.     Cervical back: Normal range of motion and neck supple.  Skin:    Coloration: Skin is not pale.     Findings: No rash.  Neurological:     Mental Status: He is alert and oriented to person, place, and time.  Psychiatric:        Behavior: Behavior normal.        Thought Content: Thought content does not include homicidal or suicidal ideation. Thought content does not include homicidal or suicidal plan.     ED Results / Procedures / Treatments   Labs (all labs ordered are listed, but only abnormal results are displayed) Labs Reviewed  COMPREHENSIVE METABOLIC PANEL - Abnormal; Notable for the following components:      Result Value   Glucose, Bld 108 (*)    Calcium 8.7 (*)    Alkaline Phosphatase 30 (*)    All other components within normal limits  RAPID URINE DRUG SCREEN, HOSP PERFORMED - Abnormal; Notable for the following components:   Cocaine POSITIVE (*)     Tetrahydrocannabinol POSITIVE (*)  All other components within normal limits  ETHANOL  CBC    EKG None  Radiology No results found.  Procedures Procedures (including critical care time)  Medications Ordered in ED Medications - No data to display  ED Course  I have reviewed the triage vital signs and the nursing notes.  Pertinent labs & imaging results that were available during my care of the patient were reviewed by me and considered in my medical decision making (see chart for details).    MDM Rules/Calculators/A&P                          59 yo M here with a chief complaints of wanting rehab from alcohol marijuana and cocaine.  Lasted cocaine and marijuana couple days ago last drink today.  Not in withdrawal.  No concerning lab abnormality.  Will discharge home.  Given a list of places that can help him with rehab.  The patients results and plan were reviewed and discussed.   Any x-rays performed were independently reviewed by myself.   Differential diagnosis were considered with the presenting HPI.  Medications - No data to display  Vitals:   11/11/20 1104 11/11/20 1114 11/11/20 1115 11/11/20 1406  BP: (!) 132/97   (!) 130/103  Pulse: 69   88  Resp: 16   18  Temp:   98 F (36.7 C)   TempSrc: Oral  Oral   SpO2: 97%   99%  Weight:  83.9 kg    Height:  5\' 10"  (1.778 m)      Final diagnoses:  Polysubstance abuse ([place])    Admission/ observation were discussed with the admitting physician, patient and/or family and they are comfortable with the plan.   Final Clinical Impression(s) / ED Diagnoses Final diagnoses:  Polysubstance abuse [facility])    Rx / DC Orders ED Discharge Orders    None       Deno Etienne, DO 11/11/20 Riggins, [place], DO 11/11/20 1436

## 2020-11-11 NOTE — ED Notes (Signed)
Admission information, labs & NP note faxed to [facility].

## 2020-11-11 NOTE — ED Notes (Signed)
ED Provider at bedside. 

## 2020-11-17 ENCOUNTER — Telehealth (HOSPITAL_COMMUNITY): Payer: Self-pay | Admitting: Family Medicine

## 2020-11-17 NOTE — Telephone Encounter (Signed)
Care Management - Follow Up [facility] Discharges   Writer left a HIPPA compliant voice mail.    Writer left name and phone number for the patent to call back if further assistance is needed in scheduling a follow up appointment with an outpatient provider.

## 2020-11-20 ENCOUNTER — Emergency Department (HOSPITAL_COMMUNITY): Payer: Medicare Other

## 2020-11-20 ENCOUNTER — Other Ambulatory Visit: Payer: Self-pay | Admitting: Orthopedic Surgery

## 2020-11-20 ENCOUNTER — Other Ambulatory Visit: Payer: Self-pay

## 2020-11-20 ENCOUNTER — Encounter (HOSPITAL_COMMUNITY): Payer: Self-pay

## 2020-11-20 ENCOUNTER — Emergency Department (HOSPITAL_COMMUNITY)
Admission: EM | Admit: 2020-11-20 | Discharge: 2020-11-21 | Disposition: A | Discharge status: Home / Self Care | Payer: Medicare Other | Attending: Emergency Medicine | Admitting: Emergency Medicine

## 2020-11-20 DIAGNOSIS — Z79899 Other long term (current) drug therapy: Secondary | ICD-10-CM | POA: Insufficient documentation

## 2020-11-20 DIAGNOSIS — M542 Cervicalgia: Secondary | ICD-10-CM | POA: Diagnosis not present

## 2020-11-20 DIAGNOSIS — E1169 Type 2 diabetes mellitus with other specified complication: Secondary | ICD-10-CM | POA: Diagnosis not present

## 2020-11-20 DIAGNOSIS — E1140 Type 2 diabetes mellitus with diabetic neuropathy, unspecified: Secondary | ICD-10-CM | POA: Diagnosis not present

## 2020-11-20 DIAGNOSIS — I10 Essential (primary) hypertension: Secondary | ICD-10-CM | POA: Diagnosis not present

## 2020-11-20 DIAGNOSIS — E785 Hyperlipidemia, unspecified: Secondary | ICD-10-CM | POA: Insufficient documentation

## 2020-11-20 DIAGNOSIS — M25511 Pain in right shoulder: Secondary | ICD-10-CM | POA: Insufficient documentation

## 2020-11-20 DIAGNOSIS — Z7984 Long term (current) use of oral hypoglycemic drugs: Secondary | ICD-10-CM | POA: Diagnosis not present

## 2020-11-20 DIAGNOSIS — F17210 Nicotine dependence, cigarettes, uncomplicated: Secondary | ICD-10-CM | POA: Insufficient documentation

## 2020-11-20 DIAGNOSIS — R0789 Other chest pain: Secondary | ICD-10-CM | POA: Insufficient documentation

## 2020-11-20 DIAGNOSIS — Z96642 Presence of left artificial hip joint: Secondary | ICD-10-CM | POA: Insufficient documentation

## 2020-11-20 DIAGNOSIS — W01198A Fall on same level from slipping, tripping and stumbling with subsequent striking against other object, initial encounter: Secondary | ICD-10-CM | POA: Insufficient documentation

## 2020-11-20 LAB — CBC
HCT: 44.7 % (ref 39.0–52.0)
Hemoglobin: 14.7 g/dL (ref 13.0–17.0)
MCH: 30.9 pg (ref 26.0–34.0)
MCHC: 32.9 g/dL (ref 30.0–36.0)
MCV: 94.1 fL (ref 80.0–100.0)
Platelets: 289 10*3/uL (ref 150–400)
RBC: 4.75 MIL/uL (ref 4.22–5.81)
RDW: 14.6 % (ref 11.5–15.5)
WBC: 6 10*3/uL (ref 4.0–10.5)
nRBC: 0 % (ref 0.0–0.2)

## 2020-11-20 LAB — GLUCOSE, CAPILLARY: Glucose-Capillary: 94 mg/dL (ref 70–99)

## 2020-11-20 LAB — BASIC METABOLIC PANEL
Anion gap: 10 (ref 5–15)
BUN: 23 mg/dL — ABNORMAL HIGH (ref 6–20)
CO2: 24 mmol/L (ref 22–32)
Calcium: 9.1 mg/dL (ref 8.9–10.3)
Chloride: 105 mmol/L (ref 98–111)
Creatinine, Ser: 1.04 mg/dL (ref 0.61–1.24)
GFR, Estimated: >60 mL/min (ref >60)
Glucose, Bld: 105 mg/dL — ABNORMAL HIGH (ref 70–99)
Potassium: 3.5 mmol/L (ref 3.5–5.1)
Sodium: 139 mmol/L (ref 135–145)

## 2020-11-20 LAB — TROPONIN I (HIGH SENSITIVITY)
Troponin I (High Sensitivity): 7 ng/L (ref <18)
Troponin I (High Sensitivity): 7 ng/L (ref <18)

## 2020-11-20 NOTE — ED Triage Notes (Signed)
Pt to er, pt states that he is here for htn, and problems with his sugar, states that he is out of his medications, states that he last checked his sugar on Wednesday, states that he also has some chest pain, but thinks that it is acid reflux.

## 2020-11-20 NOTE — Telephone Encounter (Signed)
Patient has called again requesting his pain medicine.   Patient has called the pharmacy and they have not received anything from [place]     oxyCODONE HCl 5 MG TAKE 1 TABLET BY MOUTH EVERY 6 HOURS AS NEEDED.   Pharmacy [facility]

## 2020-11-21 LAB — CBG MONITORING, ED: Glucose-Capillary: 83 mg/dL (ref 70–99)

## 2020-11-21 LAB — RAPID URINE DRUG SCREEN, HOSP PERFORMED
Amphetamines: NOT DETECTED
Barbiturates: NOT DETECTED
Benzodiazepines: POSITIVE — AB
Cocaine: POSITIVE — AB
Opiates: NOT DETECTED
Tetrahydrocannabinol: NOT DETECTED

## 2020-11-21 MED ORDER — LISINOPRIL 10 MG PO TABS
10.0000 mg | ORAL_TABLET | Freq: Every day | ORAL | 0 refills | Status: DC
Start: 2020-11-21 — End: 2021-01-01

## 2020-11-21 MED ORDER — AMLODIPINE BESYLATE 5 MG PO TABS
5.0000 mg | ORAL_TABLET | Freq: Once | ORAL | Status: AC
  Administered 2020-11-21: 5 mg via ORAL
  Filled 2020-11-21: qty 1

## 2020-11-21 MED ORDER — AMLODIPINE BESYLATE 5 MG PO TABS
5.0000 mg | ORAL_TABLET | Freq: Every day | ORAL | 0 refills | Status: DC
Start: 2020-11-21 — End: 2021-01-01

## 2020-11-21 MED ORDER — LISINOPRIL 10 MG PO TABS
10.0000 mg | ORAL_TABLET | Freq: Once | ORAL | Status: AC
  Administered 2020-11-21: 10 mg via ORAL
  Filled 2020-11-21: qty 1

## 2020-11-21 MED ORDER — METFORMIN HCL 850 MG PO TABS
850.0000 mg | ORAL_TABLET | Freq: Every day | ORAL | 0 refills | Status: DC
Start: 2020-11-21 — End: 2021-01-01

## 2020-11-21 NOTE — Discharge Instructions (Addendum)
You can take ibuprofen 400 mg + Omeprazole OTC twice a day for your chest wall pain. You will need to call [facility] after 8 am to talk to them about being admitted there. Look at the resource guide if they are unable to take you.  Your blood sugar was 83 today.

## 2020-11-21 NOTE — ED Provider Notes (Signed)
[facility] EMERGENCY DEPARTMENT Provider Note   CSN: [id number] Arrival date & time: 11/20/20  1556   Time seen 11:54 PM  History Chief Complaint  Patient presents with  . Chest Pain  . Hypertension    [name] [name]. is a 59 y.o. male.  HPI   Patient gets me a story that he was admitted at [facility] and [facility] for alcohol and drug abuse.  He states he went there November 21 and was discharged on the 22nd.  And then he went back on the 23rd  and they discharged him on the 24th and then he was admitted again on the 25th, Thanksgiving at noon and was discharged the following day.  He states when they discharged him they kept his medications.  He called him today and they are going to mail them to him.  He also states while he was there he had spilled some coffee on the floor on the 24th.  He was mopping up the floor and then he went to the bathroom for some reason and mopped that floor.  He came back out of the bathroom and then went back in and forgot the floor was wet and states he slipped and fell backwards and he states he had pain in his right shoulder however he has graphically showing me good range of motion, neck pain although he graphically illustrates to me how his neck was a whiplash and low back pain and then at the same time he started having chest pain in his lower chest and upper abdomen.  He states the chest pain comes and goes and lasts 35 to 40 minutes at a time.  He describes it as tightening and sharp.  He states it can come at rest and with exertion although sometimes walking makes it worse.  He states rubbing his chest makes it feel better.  He also describes as a soreness to his chest.  He states he is coughing up black phlegm and this also started about the time he fell.  He denies fever.  He states he feels like he is having some night sweats.  He has nausea without vomiting states he is having watery diarrhea about 3 times a day.  He states his upper abdomen is sore and  certain foods make it hurt more.  He also states he was on medication for GERD which he does not have now.  Patient also states he is homeless.  When I ask him where they are mailing his medications he states they are mail it to a friend's house.  I asked him if he could stay with that friend and he said he guessed he could.  He also tells me that he has spoken to [facility] about staying for drug abuse and they told him they can take him "anytime".  However he states he does not have a ride until November 1.  PCP Denyce Talis, FNP   Past Medical History:  Diagnosis Date  . Acid reflux   . Alcohol abuse    6-8 cans of beer daily; hx of incarceration for DWI  . Arthritis   . Bipolar disorder ([place])   . Bronchitis   . Chronic back pain   . Chronic neck pain   . Chronic pain   . Diabetes mellitus without complication ([place])   . Fracture of lower leg   . Gout   . Hyperlipidemia   . Hypertension   . Left arm pain    chronic  .  Migraine   . Pancreatitis    March 2013  . Pseudocyst of pancreas 02/28/2012  . Substance abuse [facility])     Patient Active Problem List   Diagnosis Date Noted  . Type 2 diabetes mellitus with diabetic neuropathy, without long-term current use of insulin ([place]) 09/21/2020  . Septic arthritis ([place]) 08/09/2019  . S/P right knee arthroscopy 07/29/2019 08/03/2019  . Acute lateral meniscus tear of right knee   . Acute medial meniscus tear of right knee   . Substance use disorder 03/16/2019  . History of diabetes mellitus 10/10/2016  . Avascular necrosis of hip, left ([place]) 10/04/2016  . Status post left hip replacement 10/04/2016  . Rash and nonspecific skin eruption 08/12/2013  . Cholelithiases 08/04/2013  . Subcutaneous nodule 07/21/2013  . Acute alcoholic pancreatitis 09/98/3382  . Syncope 04/11/2013  . Prolonged Q-T interval on ECG 04/11/2013  . Cocaine abuse ([place]) 03/13/2013    Class: Chronic  . At risk for adverse drug event 02/14/2013  . DDD (degenerative disc  disease), lumbar 02/01/2013  . Dyspepsia 12/01/2012  . BPH (benign prostatic hyperplasia) 10/07/2012  . Allergic rhinitis 10/03/2012  . Transaminitis 10/02/2012  . Chronic pain syndrome 10/02/2012  . Essential hypertension, benign 07/01/2012  . Tobacco user 07/01/2012  . Hepatic steatosis 03/06/2012  . ED (erectile dysfunction) 03/06/2012  . Pseudocyst of pancreas 02/28/2012  . Alcohol abuse 02/25/2012  . GERD (gastroesophageal reflux disease) 02/23/2012  . Bipolar 1 disorder ([place]) 02/23/2012  . Hypertriglyceridemia 12/05/2011    Past Surgical History:  Procedure Laterality Date  . CIRCUMCISION  03/17/2012   Procedure: CIRCUMCISION ADULT;  Surgeon: Marissa Nestle, MD;  Location: [facility];  Service: Urology;  Laterality: N/A;  . COLONOSCOPY WITH PROPOFOL  12/24/2012   NKN:LZJQ lesion as described above status post biopsy; otherwise normal rectum/Sigmoid diverticulosis/Sigmoid polyps -- resected as described above. anal lesion, benign. colon polyp, hyperplastic  . ESOPHAGOGASTRODUODENOSCOPY (EGD) WITH PROPOFOL  12/24/2012   BHA:[id number] erythema and erosions of uncertain significance status post gastric biopsy (reactive gastropathy NO h.pylori)  . KNEE ARTHROSCOPY Right 08/09/2019   Procedure: arthroscopic incision and drainage snovectomy;  Surgeon: Paralee Cancel, MD;  Location: [facility];  Service: Orthopedics;  Laterality: Right;  . KNEE ARTHROSCOPY WITH MEDIAL MENISECTOMY Right 07/29/2019   Procedure: RIGHT KNEE ARTHROSCOPY WITH MEDIAL MENISCECTOMY AND LATERAL MENISCECTOMY;  Surgeon: Carole Civil, MD;  Location: [facility];  Service: Orthopedics;  Laterality: Right;  . NOSE SURGERY     broken nose  . TOTAL HIP ARTHROPLASTY Left 10/04/2016   Procedure: LEFT TOTAL HIP ARTHROPLASTY ANTERIOR APPROACH;  Surgeon: Mcarthur Rossetti, MD;  Location: [facility];  Service: Orthopedics;  Laterality: Left;       Family History  Problem Relation Age of Onset  . Diabetes Father   . Healthy Mother    . Anesthesia problems Neg Hx   . Hypotension Neg Hx   . Malignant hyperthermia Neg Hx   . Pseudochol deficiency Neg Hx   . Colon cancer Neg Hx   . Heart disease Neg Hx   . Stroke Neg Hx   . Cancer Neg Hx     Social History   Tobacco Use  . Smoking status: Current Every Day Smoker    Packs/day: 0.50    Years: 25.00    Pack years: 12.50    Types: Cigarettes  . Smokeless tobacco: Former Systems developer    Types: Chew    Quit date: 12/23/2014  Vaping Use  . Vaping Use: Never used  Substance Use Topics  . Alcohol use: Yes  . Drug use: Yes    Types: Cocaine, Marijuana    Home Medications Prior to Admission medications   Medication Sig Start Date End Date Taking? Authorizing Provider  amLODipine (NORVASC) 5 MG tablet Take 1 tablet (5 mg total) by mouth daily. 11/21/20   Rolland Porter, MD  celecoxib (CELEBREX) 100 MG capsule Take 1 capsule (100 mg total) by mouth daily as needed for moderate pain. 11/22/19   Carole Civil, MD  cloNIDine (CATAPRES) 0.2 MG tablet Take 0.2 mg by mouth at bedtime. 10/25/20   [provider]  fluticasone (FLONASE) 50 MCG/ACT nasal spray Place 2 sprays into both nostrils daily as needed for allergies.     [provider]  gabapentin (NEURONTIN) 400 MG capsule Take 1 capsule (400 mg total) by mouth 3 (three) times daily. 11/02/20   Carole Civil, MD  guaifenesin (MUCUS RELIEF) 400 MG TABS tablet Take 400 mg by mouth daily.    [provider]  levocetirizine (XYZAL) 5 MG tablet Take 5 mg by mouth every evening.    [provider]  lisinopril (ZESTRIL) 10 MG tablet Take 1 tablet (10 mg total) by mouth daily. 11/21/20   Rolland Porter, MD  lovastatin (MEVACOR) 40 MG tablet Take 1 tablet by mouth daily. 06/30/19   [provider]  metFORMIN (GLUCOPHAGE) 850 MG tablet Take 1 tablet (850 mg total) by mouth daily with breakfast. 11/21/20   Rolland Porter, MD  omeprazole (PRILOSEC) 40 MG capsule Take 40 mg by mouth daily.  01/30/18    [provider]  oxyCODONE (OXY IR/ROXICODONE) 5 MG immediate release tablet TAKE 1 TABLET BY MOUTH EVERY 6 HOURS AS NEEDED. 11/20/20   Carole Civil, MD  tamsulosin (FLOMAX) 0.4 MG CAPS capsule Take 1 capsule by mouth daily. 06/30/19   [provider]  traZODone (DESYREL) 100 MG tablet Take 1-2 tablets by mouth at bedtime as needed. 02/15/19   [provider]    Allergies    Bee venom and Penicillins  Review of Systems   Review of Systems  All other systems reviewed and are negative.   Physical Exam Updated Vital Signs BP (!) 145/108   Pulse 63   Temp 98.3 F (36.8 C) (Oral)   Resp 16   Ht 5\' 10"  (1.778 m)   Wt 90.7 kg   SpO2 95%   BMI 28.70 kg/m   Physical Exam Vitals and nursing note reviewed.  Constitutional:      General: He is not in acute distress.    Appearance: Normal appearance. He is normal weight.     Comments: Patient constantly asking me for food to eat  HENT:     Head: Normocephalic and atraumatic.     Right Ear: External ear normal.     Left Ear: External ear normal.  Eyes:     Extraocular Movements: Extraocular movements intact.     Conjunctiva/sclera: Conjunctivae normal.     Pupils: Pupils are equal, round, and reactive to light.  Cardiovascular:     Rate and Rhythm: Normal rate and regular rhythm.     Pulses: Normal pulses.     Heart sounds: Normal heart sounds.  Pulmonary:     Effort: Pulmonary effort is normal. No respiratory distress.     Breath sounds: Normal breath sounds.     Comments: Patient has diffuse tenderness of his anterior chest even with light placement by stethoscope. Chest:  Chest wall: Tenderness present.  Abdominal:     General: Abdomen is flat. Bowel sounds are normal.     Palpations: Abdomen is soft.     Tenderness: There is no guarding or rebound.     Comments: No focal tenderness  Musculoskeletal:        General: No deformity. Normal range of motion.     Cervical back: Normal range  of motion. No rigidity.     Comments: Excellent range of motion  Skin:    General: Skin is warm and dry.  Neurological:     General: No focal deficit present.     Mental Status: He is alert and oriented to person, place, and time.     Cranial Nerves: No cranial nerve deficit.  Psychiatric:        Mood and Affect: Mood normal.        Behavior: Behavior normal.        Thought Content: Thought content normal.     ED Results / Procedures / Treatments   Labs (all labs ordered are listed, but only abnormal results are displayed) Results for orders placed or performed during the hospital encounter of 77/82/42  Basic metabolic panel  Result Value Ref Range   Sodium 139 135 - 145 mmol/L   Potassium 3.5 3.5 - 5.1 mmol/L   Chloride 105 98 - 111 mmol/L   CO2 24 22 - 32 mmol/L   Glucose, Bld 105 (H) 70 - 99 mg/dL   BUN 23 (H) 6 - 20 mg/dL   Creatinine, Ser 1.04 0.61 - 1.24 mg/dL   Calcium 9.1 8.9 - 10.3 mg/dL   GFR, Estimated >60 >60 mL/min   Anion gap 10 5 - 15  CBC  Result Value Ref Range   WBC 6.0 4.0 - 10.5 K/uL   RBC 4.75 4.22 - 5.81 MIL/uL   Hemoglobin 14.7 13.0 - 17.0 g/dL   HCT 44.7 39 - 52 %   MCV 94.1 80.0 - 100.0 fL   MCH 30.9 26.0 - 34.0 pg   MCHC 32.9 30.0 - 36.0 g/dL   RDW 14.6 11.5 - 15.5 %   Platelets 289 150 - 400 K/uL   nRBC 0.0 0.0 - 0.2 %  Glucose, capillary  Result Value Ref Range   Glucose-Capillary 94 70 - 99 mg/dL  POC CBG, ED  Result Value Ref Range   Glucose-Capillary 83 70 - 99 mg/dL  Troponin I (High Sensitivity)  Result Value Ref Range   Troponin I (High Sensitivity) 7 <18 ng/L  Troponin I (High Sensitivity)  Result Value Ref Range   Troponin I (High Sensitivity) 7 <18 ng/L    Laboratory interpretation all normal    EKG EKG Interpretation  Date/Time:  Monday November 20 2020 16:02:09 EST Ventricular Rate:  78 PR Interval:  140 QRS Duration: 88 QT Interval:  400 QTC Calculation: 456 R Axis:   8 Text Interpretation: Normal sinus  rhythm Normal ECG No significant change since last tracing 09 Aug 2020 Confirmed by Rolland Porter [phone]) on 11/20/2020 11:33:09 PM   Radiology DG Chest 2 View  Result Date: 11/20/2020 CLINICAL DATA:  Chest pain with history of hypertension diabetes EXAM: CHEST - 2 VIEW COMPARISON:  Chest radiograph November 16, 2020. FINDINGS: The heart size and mediastinal contours are unchanged. Similar mildly tortuous thoracic aorta. Both lungs are clear. No visible pneumothorax or pleural effusion. Three the visualized skeletal structures are unremarkable. IMPRESSION: No active cardiopulmonary disease. Electronically Signed   By: Dellis Filbert  Nance Pew MD   On: 11/20/2020 16:31   DG Shoulder Right  Result Date: 11/02/2020 Radiology report Ortho care imaging Chief complaint pain right shoulder AP lateral right shoulder Scapula shows no fracture acromion is a type I humeral joint is normal greater tuberosity show some sclerosis otherwise the shoulder looks good The distal clavicle is 100% superior to the acromion there are some degenerative changes there Impression normal glenohumeral joint Arthritis and superior subluxation acromioclavicular joint could be chronic  Procedures Procedures (including critical care time)  Medications Ordered in ED Medications  amLODipine (NORVASC) tablet 5 mg (has no administration in time range)  lisinopril (ZESTRIL) tablet 10 mg (has no administration in time range)    ED Course  I have reviewed the triage vital signs and the nursing notes.  Pertinent labs & imaging results that were available during my care of the patient were reviewed by me and considered in my medical decision making (see chart for details).    MDM Rules/Calculators/A&P                          Interestingly he states his right shoulder hurts and he has a x-ray done on November 11 by an orthopedist evaluating his right shoulder.  Nursing staff called [facility] and they have no record of him calling.  I gave  patient printed refills for his blood pressure and diabetes medication.  He was given the resource guide for inpatient drug rehabilitation.  He will need to follow-up with that tomorrow.  His chest pain appears to be chest wall pain.  His abdominal pain seems very benign.  Patient constantly wants to eat.  It also appears he is homeless although he evidently has a friend he is going to have his prescriptions mailed to.  Review of the [place] shows that since July 12, 2020 Dr. Aline Brochure, orthopedics has been prescribing patient #[id number] oxycodone 5 mg every 7 days.  They were last filled November 15.   Final Clinical Impression(s) / ED Diagnoses Final diagnoses:  Chest wall pain    Rx / DC Orders ED Discharge Orders         Ordered    amLODipine (NORVASC) 5 MG tablet  Daily        11/21/20 0045    lisinopril (ZESTRIL) 10 MG tablet  Daily        11/21/20 0047    metFORMIN (GLUCOPHAGE) 850 MG tablet  Daily with breakfast        11/21/20 0047         Plan discharge  Rolland Porter, MD, Barbette Or, MD 11/21/20 [phone]

## 2020-12-01 ENCOUNTER — Other Ambulatory Visit: Payer: Self-pay | Admitting: Orthopedic Surgery

## 2020-12-04 ENCOUNTER — Telehealth: Payer: Self-pay | Admitting: Orthopedic Surgery

## 2020-12-04 DIAGNOSIS — M25511 Pain in right shoulder: Secondary | ICD-10-CM

## 2020-12-04 NOTE — Telephone Encounter (Signed)
Patient called to ask about status of having a CT scan of right shoulder per last office visit with Dr Aline Brochure?  Please advise.

## 2020-12-04 NOTE — Telephone Encounter (Signed)
Dr Aline Brochure ordered MRI right shoulder, do not see order, have put in system   He can call to schedule  I called gave him the number to call but his mailbox is full.

## 2020-12-05 ENCOUNTER — Ambulatory Visit: Payer: Medicare Other | Admitting: Orthopaedic Surgery

## 2020-12-12 ENCOUNTER — Encounter (HOSPITAL_COMMUNITY): Payer: Self-pay

## 2020-12-12 ENCOUNTER — Emergency Department (HOSPITAL_COMMUNITY)
Admission: EM | Admit: 2020-12-12 | Discharge: 2020-12-12 | Disposition: A | Discharge status: Home / Self Care | Payer: Medicare Other | Attending: Emergency Medicine | Admitting: Emergency Medicine

## 2020-12-12 ENCOUNTER — Other Ambulatory Visit: Payer: Self-pay

## 2020-12-12 DIAGNOSIS — M25511 Pain in right shoulder: Secondary | ICD-10-CM | POA: Diagnosis not present

## 2020-12-12 DIAGNOSIS — I10 Essential (primary) hypertension: Secondary | ICD-10-CM | POA: Diagnosis not present

## 2020-12-12 DIAGNOSIS — G43909 Migraine, unspecified, not intractable, without status migrainosus: Secondary | ICD-10-CM | POA: Diagnosis not present

## 2020-12-12 DIAGNOSIS — Z5321 Procedure and treatment not carried out due to patient leaving prior to being seen by health care provider: Secondary | ICD-10-CM | POA: Insufficient documentation

## 2020-12-12 DIAGNOSIS — E119 Type 2 diabetes mellitus without complications: Secondary | ICD-10-CM | POA: Insufficient documentation

## 2020-12-12 DIAGNOSIS — R519 Headache, unspecified: Secondary | ICD-10-CM | POA: Diagnosis present

## 2020-12-12 LAB — CBG MONITORING, ED: Glucose-Capillary: 87 mg/dL (ref 70–99)

## 2020-12-12 NOTE — ED Triage Notes (Signed)
Pt here for eval/treatment of migraine headache that started 2 days ago, states that he also has pain in R shoulder. Hx htn, DM, requesting sugar be checked in triage.

## 2020-12-12 NOTE — ED Notes (Signed)
Pt states that he is leaving due to wait time 

## 2020-12-19 ENCOUNTER — Other Ambulatory Visit: Payer: Self-pay | Admitting: Orthopedic Surgery

## 2020-12-20 ENCOUNTER — Other Ambulatory Visit: Payer: Self-pay | Admitting: Orthopedic Surgery

## 2020-12-21 ENCOUNTER — Encounter: Payer: Self-pay | Admitting: Orthopedic Surgery

## 2020-12-21 ENCOUNTER — Other Ambulatory Visit: Payer: Self-pay

## 2020-12-21 ENCOUNTER — Ambulatory Visit (INDEPENDENT_AMBULATORY_CARE_PROVIDER_SITE_OTHER): Payer: Medicare Other | Admitting: Orthopedic Surgery

## 2020-12-21 VITALS — BP 163/102 | HR 80 | Ht 70.0 in | Wt 200.0 lb

## 2020-12-21 DIAGNOSIS — G8929 Other chronic pain: Secondary | ICD-10-CM | POA: Diagnosis not present

## 2020-12-21 DIAGNOSIS — M25561 Pain in right knee: Secondary | ICD-10-CM

## 2020-12-21 DIAGNOSIS — Z9889 Other specified postprocedural states: Secondary | ICD-10-CM

## 2020-12-21 MED ORDER — OXYCODONE HCL 5 MG PO TABS: 5.0000 mg | ORAL_TABLET | Freq: Four times a day (QID) | ORAL | 0 refills | Status: DC | PRN

## 2020-12-21 NOTE — Progress Notes (Signed)
FOLLOW-UP OFFICE VISIT   Encounter Diagnoses  Name Primary?  . Chronic pain of right knee Yes  . S/P right knee arthroscopy     59 year old male had an arthroscopy of his right knee developed a postop infection had an arthroscopic lavage and since that time his had chronic knee pain with recurrent effusions which have intermittently shown no signs of infection  Right knee is in stable condition pain is well controlled with oxycodone IR 5 mg no swelling recently  (and prior treatment)  + EXAM FINDINGS:  Right knee no swelling full range of motion no instability no tenderness  ASSESSMENT AND PLAN Stable chronic right knee pain after infection  Continue oxycodone IR 5 mg every 6  Follow-up after MRI right shoulder  Meds ordered this encounter  Medications  . oxyCODONE (OXY IR/ROXICODONE) 5 MG immediate release tablet    Sig: Take 1 tablet (5 mg total) by mouth every 6 (six) hours as needed.    Dispense:  28 tablet    Refill:  0

## 2020-12-27 ENCOUNTER — Other Ambulatory Visit: Payer: Self-pay | Admitting: Orthopedic Surgery

## 2020-12-27 DIAGNOSIS — G8929 Other chronic pain: Secondary | ICD-10-CM

## 2020-12-27 DIAGNOSIS — Z9889 Other specified postprocedural states: Secondary | ICD-10-CM

## 2020-12-29 ENCOUNTER — Ambulatory Visit (HOSPITAL_COMMUNITY): Admission: RE | Admit: 2020-12-29 | Payer: Medicare Other | Source: Ambulatory Visit

## 2020-12-30 ENCOUNTER — Other Ambulatory Visit: Payer: Self-pay

## 2020-12-30 ENCOUNTER — Emergency Department (HOSPITAL_COMMUNITY)
Admission: EM | Admit: 2020-12-30 | Discharge: 2020-12-30 | Disposition: A | Discharge status: Home / Self Care | Payer: Medicare Other | Attending: Emergency Medicine | Admitting: Emergency Medicine

## 2020-12-30 ENCOUNTER — Encounter (HOSPITAL_COMMUNITY): Payer: Self-pay | Admitting: Emergency Medicine

## 2020-12-30 ENCOUNTER — Emergency Department (HOSPITAL_COMMUNITY): Payer: Medicare Other

## 2020-12-30 DIAGNOSIS — Z79899 Other long term (current) drug therapy: Secondary | ICD-10-CM | POA: Insufficient documentation

## 2020-12-30 DIAGNOSIS — F1590 Other stimulant use, unspecified, uncomplicated: Secondary | ICD-10-CM | POA: Insufficient documentation

## 2020-12-30 DIAGNOSIS — Z7984 Long term (current) use of oral hypoglycemic drugs: Secondary | ICD-10-CM | POA: Diagnosis not present

## 2020-12-30 DIAGNOSIS — Y900 Blood alcohol level of less than 20 mg/100 ml: Secondary | ICD-10-CM | POA: Insufficient documentation

## 2020-12-30 DIAGNOSIS — E119 Type 2 diabetes mellitus without complications: Secondary | ICD-10-CM | POA: Insufficient documentation

## 2020-12-30 DIAGNOSIS — Z96642 Presence of left artificial hip joint: Secondary | ICD-10-CM | POA: Insufficient documentation

## 2020-12-30 DIAGNOSIS — F1410 Cocaine abuse, uncomplicated: Secondary | ICD-10-CM | POA: Diagnosis not present

## 2020-12-30 DIAGNOSIS — F17210 Nicotine dependence, cigarettes, uncomplicated: Secondary | ICD-10-CM | POA: Diagnosis not present

## 2020-12-30 DIAGNOSIS — F1010 Alcohol abuse, uncomplicated: Secondary | ICD-10-CM

## 2020-12-30 DIAGNOSIS — R079 Chest pain, unspecified: Secondary | ICD-10-CM

## 2020-12-30 DIAGNOSIS — R0789 Other chest pain: Secondary | ICD-10-CM | POA: Diagnosis not present

## 2020-12-30 LAB — COMPREHENSIVE METABOLIC PANEL
ALT: 30 U/L (ref 0–44)
AST: 67 U/L — ABNORMAL HIGH (ref 15–41)
Albumin: 4.2 g/dL (ref 3.5–5.0)
Alkaline Phosphatase: 37 U/L — ABNORMAL LOW (ref 38–126)
Anion gap: 12 (ref 5–15)
BUN: 15 mg/dL (ref 6–20)
CO2: 21 mmol/L — ABNORMAL LOW (ref 22–32)
Calcium: 8.7 mg/dL — ABNORMAL LOW (ref 8.9–10.3)
Chloride: 101 mmol/L (ref 98–111)
Creatinine, Ser: 1.18 mg/dL (ref 0.61–1.24)
GFR, Estimated: >60 mL/min (ref >60)
Glucose, Bld: 81 mg/dL (ref 70–99)
Potassium: 3.7 mmol/L (ref 3.5–5.1)
Sodium: 134 mmol/L — ABNORMAL LOW (ref 135–145)
Total Bilirubin: 0.6 mg/dL (ref 0.3–1.2)
Total Protein: 7.3 g/dL (ref 6.5–8.1)

## 2020-12-30 LAB — CBC
HCT: 44.4 % (ref 39.0–52.0)
Hemoglobin: 15 g/dL (ref 13.0–17.0)
MCH: 30.9 pg (ref 26.0–34.0)
MCHC: 33.8 g/dL (ref 30.0–36.0)
MCV: 91.5 fL (ref 80.0–100.0)
Platelets: 314 10*3/uL (ref 150–400)
RBC: 4.85 MIL/uL (ref 4.22–5.81)
RDW: 14.9 % (ref 11.5–15.5)
WBC: 4.4 10*3/uL (ref 4.0–10.5)
nRBC: 0 % (ref 0.0–0.2)

## 2020-12-30 LAB — RAPID URINE DRUG SCREEN, HOSP PERFORMED
Amphetamines: NOT DETECTED
Barbiturates: NOT DETECTED
Benzodiazepines: NOT DETECTED
Cocaine: POSITIVE — AB
Opiates: NOT DETECTED
Tetrahydrocannabinol: NOT DETECTED

## 2020-12-30 LAB — TROPONIN I (HIGH SENSITIVITY): Troponin I (High Sensitivity): 7 ng/L (ref <18)

## 2020-12-30 LAB — ETHANOL: Alcohol, Ethyl (B): 17 mg/dL — ABNORMAL HIGH (ref <10)

## 2020-12-30 MED ORDER — PANTOPRAZOLE SODIUM 40 MG PO TBEC
40.0000 mg | DELAYED_RELEASE_TABLET | Freq: Once | ORAL | Status: AC
  Administered 2020-12-30: 40 mg via ORAL
  Filled 2020-12-30: qty 1

## 2020-12-30 NOTE — Discharge Instructions (Addendum)
Your lab tests tonight are stable with no evidence of heart injury from your recent cocaine use.  I encourage you to take the steps to stop using this drug.  I have given a list of resources for you as alternatives if you are unable to get back into [facility] for this.  Additionally, we tried to locate a shelter for you - there is not one in [facility], but there is one in [place] at [employer] at [address].  Phone # [phone].  However, you have to arrive no later than 8 pm and will have to leave at 7 am. This will not be helpful tonight, but may be tomorrow - you would have to find transportation to get there.

## 2020-12-30 NOTE — ED Notes (Signed)
Recent detox in [place] [facility] 10 days   Lab delayed no phlebotomist seen in ED today

## 2020-12-30 NOTE — ED Triage Notes (Signed)
Patient requesting help with alcohol, cocaine, and marijuana addiction. Per patient drinks "all day every day wine, beer, and liquor." Patient reports that he also uses cocaine and marijuana daily. Per patient last used both today around 10:30am. Patient reports mid-sternal chest pain that started last night. Non-radiating. Per patient shortness of breath.  Denies any cardiac hx. Patient denies any SI or HI.

## 2020-12-30 NOTE — ED Provider Notes (Signed)
[facility] EMERGENCY DEPARTMENT Provider Note   CSN: [id number] Arrival date & time: 12/30/20  1226     History Chief Complaint  Patient presents with   Addiction Problem    [name] [name]. is a 60 y.o. male with a history as outlined below, most significant for polysubstance abuse including EtOH, cocaine and marijuana also with a mental health history of bipolar disorder, medical history including diabetes, hypertension, hyperlipidemia, presenting today for 2 concerns, first he is desirous of assistance for his polysubstance abuse.  He has had multiple inpatient admissions for this problem, stating he was admitted in [place] 3 months ago, then last month was admitted at [place].  He remained sober for 10 days after his last admission.  He reports drinking daily, today consumed a pint of wine and 40 ounces of beer.  He denies current intoxication.  He also has concerns about currently being homeless.  He has been sleeping in the bed and cars here in [place].  Patient denies homicidal or suicidal ideation.  The second complaint is for chest pain which started last night while he was using cocaine.  He stated it tasted different but used it anyway, and quickly developed sharp left-sided chest pain.  This was not associated with shortness of breath, diaphoresis, nausea or vomiting.  He woke today with residual mild pressure in his chest which is almost resolved at this time.  He denies cough, fevers, no prior history of cardiac illness.  HPI     Past Medical History:  Diagnosis Date   Acid reflux    Alcohol abuse    6-8 cans of beer daily; hx of incarceration for DWI   Arthritis    Bipolar disorder (HCC)    Bronchitis    Chronic back pain    Chronic neck pain    Chronic pain    Diabetes mellitus without complication ([place])    Fracture of lower leg    Gout    Hyperlipidemia    Hypertension    Left arm pain    chronic   Migraine    Pancreatitis    March 2013    Pseudocyst of pancreas 02/28/2012   Substance abuse [facility])     Patient Active Problem List   Diagnosis Date Noted   Type 2 diabetes mellitus with diabetic neuropathy, without long-term current use of insulin ([place]) 09/21/2020   Septic arthritis ([place]) 08/09/2019   S/P right knee arthroscopy 07/29/2019 08/03/2019   Acute lateral meniscus tear of right knee    Acute medial meniscus tear of right knee    Substance use disorder 03/16/2019   History of diabetes mellitus 10/10/2016   Avascular necrosis of hip, left ([place]) 10/04/2016   Status post left hip replacement 10/04/2016   Rash and nonspecific skin eruption 08/12/2013   Cholelithiases 08/04/2013   Subcutaneous nodule 90/30/0923   Acute alcoholic pancreatitis 30/06/6225   Syncope 04/11/2013   Prolonged Q-T interval on ECG 04/11/2013   Cocaine abuse ([place]) 03/13/2013    Class: Chronic   At risk for adverse drug event 02/14/2013   DDD (degenerative disc disease), lumbar 02/01/2013   Dyspepsia 12/01/2012   BPH (benign prostatic hyperplasia) 10/07/2012   Allergic rhinitis 10/03/2012   Transaminitis 10/02/2012   Chronic pain syndrome 10/02/2012   Essential hypertension, benign 07/01/2012   Tobacco user 07/01/2012   Hepatic steatosis 03/06/2012   ED (erectile dysfunction) 03/06/2012   Pseudocyst of pancreas 02/28/2012   Alcohol abuse 02/25/2012   GERD (gastroesophageal reflux disease) 02/23/2012  Bipolar 1 disorder ([place]) 02/23/2012   Hypertriglyceridemia 12/05/2011    Past Surgical History:  Procedure Laterality Date   CIRCUMCISION  03/17/2012   Procedure: CIRCUMCISION ADULT;  Surgeon: Marissa Nestle, MD;  Location: [facility];  Service: Urology;  Laterality: N/A;   COLONOSCOPY WITH PROPOFOL  12/24/2012   [id number] lesion as described above status post biopsy; otherwise normal rectum/Sigmoid diverticulosis/Sigmoid polyps -- resected as described above. anal lesion, benign. colon polyp, hyperplastic    ESOPHAGOGASTRODUODENOSCOPY (EGD) WITH PROPOFOL  12/24/2012   [id number] erythema and erosions of uncertain significance status post gastric biopsy (reactive gastropathy NO h.pylori)   KNEE ARTHROSCOPY Right 08/09/2019   Procedure: arthroscopic incision and drainage snovectomy;  Surgeon: Paralee Cancel, MD;  Location: [facility];  Service: Orthopedics;  Laterality: Right;   KNEE ARTHROSCOPY WITH MEDIAL MENISECTOMY Right 07/29/2019   Procedure: RIGHT KNEE ARTHROSCOPY WITH MEDIAL MENISCECTOMY AND LATERAL MENISCECTOMY;  Surgeon: Carole Civil, MD;  Location: [facility];  Service: Orthopedics;  Laterality: Right;   NOSE SURGERY     broken nose   TOTAL HIP ARTHROPLASTY Left 10/04/2016   Procedure: LEFT TOTAL HIP ARTHROPLASTY ANTERIOR APPROACH;  Surgeon: Mcarthur Rossetti, MD;  Location: [facility];  Service: Orthopedics;  Laterality: Left;       Family History  Problem Relation Age of Onset   Diabetes Father    Healthy Mother    Anesthesia problems Neg Hx    Hypotension Neg Hx    Malignant hyperthermia Neg Hx    Pseudochol deficiency Neg Hx    Colon cancer Neg Hx    Heart disease Neg Hx    Stroke Neg Hx    Cancer Neg Hx     Social History   Tobacco Use   Smoking status: Current Every Day Smoker    Packs/day: 0.50    Years: 25.00    Pack years: 12.50    Types: Cigarettes   Smokeless tobacco: Former Systems developer    Types: Chew    Quit date: 12/23/2014  Vaping Use   Vaping Use: Never used  Substance Use Topics   Alcohol use: Yes    Comment: heavily   Drug use: Yes    Types: Cocaine, Marijuana    Home Medications Prior to Admission medications   Medication Sig Start Date End Date Taking? Authorizing Provider  amLODipine (NORVASC) 5 MG tablet Take 1 tablet (5 mg total) by mouth daily. 11/21/20   Rolland Porter, MD  celecoxib (CELEBREX) 100 MG capsule Take 1 capsule (100 mg total) by mouth daily as needed for moderate pain. 11/22/19   Carole Civil, MD  cloNIDine  (CATAPRES) 0.2 MG tablet Take 0.2 mg by mouth at bedtime. 10/25/20   [provider]  fluticasone (FLONASE) 50 MCG/ACT nasal spray Place 2 sprays into both nostrils daily as needed for allergies.     [provider]  gabapentin (NEURONTIN) 400 MG capsule Take 1 capsule (400 mg total) by mouth 3 (three) times daily. 11/02/20   Carole Civil, MD  guaifenesin (HUMIBID E) 400 MG TABS tablet Take 400 mg by mouth daily.    [provider]  levocetirizine (XYZAL) 5 MG tablet Take 5 mg by mouth every evening.    [provider]  lisinopril (ZESTRIL) 10 MG tablet Take 1 tablet (10 mg total) by mouth daily. 11/21/20   Rolland Porter, MD  lovastatin (MEVACOR) 40 MG tablet Take 1 tablet by mouth daily. 06/30/19   [provider]  metFORMIN (GLUCOPHAGE) 850 MG tablet  Take 1 tablet (850 mg total) by mouth daily with breakfast. 11/21/20   Rolland Porter, MD  omeprazole (PRILOSEC) 40 MG capsule Take 40 mg by mouth daily.  01/30/18   [provider]  oxyCODONE (OXY IR/ROXICODONE) 5 MG immediate release tablet TAKE 1 TABLET BY MOUTH EVERY 6 HOURS AS NEEDED. 12/28/20   Carole Civil, MD  tamsulosin (FLOMAX) 0.4 MG CAPS capsule Take 1 capsule by mouth daily. 06/30/19   [provider]  traZODone (DESYREL) 100 MG tablet Take 1-2 tablets by mouth at bedtime as needed. 02/15/19   [provider]    Allergies    Bee venom and Penicillins  Review of Systems   Review of Systems  Constitutional: Negative for chills, diaphoresis and fever.  HENT: Negative for congestion and sore throat.   Eyes: Negative.   Respiratory: Positive for chest tightness. Negative for shortness of breath.   Cardiovascular: Positive for chest pain.  Gastrointestinal: Negative for abdominal pain, nausea and vomiting.  Genitourinary: Negative.   Musculoskeletal: Negative for arthralgias, joint swelling and neck pain.  Skin: Negative.  Negative for rash and wound.  Neurological:  Negative for dizziness, tremors, weakness, light-headedness, numbness and headaches.  Psychiatric/Behavioral: Negative.  The patient is not nervous/anxious.     Physical Exam Updated Vital Signs BP 126/85    Pulse 77    Temp 98.4 F (36.9 C) (Oral)    Resp 16    Ht 5\' 10"  (1.778 m)    Wt 83.9 kg    SpO2 96%    BMI 26.54 kg/m   Physical Exam Vitals and nursing note reviewed.  Constitutional:      Appearance: He is well-developed and well-nourished.  HENT:     Head: Normocephalic and atraumatic.     Mouth/Throat:     Mouth: Mucous membranes are moist.  Eyes:     Conjunctiva/sclera: Conjunctivae normal.  Cardiovascular:     Rate and Rhythm: Normal rate and regular rhythm.     Pulses: Intact distal pulses.     Heart sounds: Normal heart sounds.  Pulmonary:     Effort: Pulmonary effort is normal.     Breath sounds: Normal breath sounds. No wheezing.  Abdominal:     General: Bowel sounds are normal.     Palpations: Abdomen is soft.     Tenderness: There is no abdominal tenderness.  Musculoskeletal:        General: Normal range of motion.     Cervical back: Normal range of motion.  Skin:    General: Skin is warm and dry.  Neurological:     Mental Status: He is alert.  Psychiatric:        Mood and Affect: Mood and affect normal.     ED Results / Procedures / Treatments   Labs (all labs ordered are listed, but only abnormal results are displayed) Labs Reviewed  COMPREHENSIVE METABOLIC PANEL - Abnormal; Notable for the following components:      Result Value   Sodium 134 (*)    CO2 21 (*)    Calcium 8.7 (*)    AST 67 (*)    Alkaline Phosphatase 37 (*)    All other components within normal limits  ETHANOL - Abnormal; Notable for the following components:   Alcohol, Ethyl (B) 17 (*)    All other components within normal limits  RAPID URINE DRUG SCREEN, HOSP PERFORMED - Abnormal; Notable for the following components:   Cocaine POSITIVE (*)    All other  components within  normal limits  CBC  TROPONIN I (HIGH SENSITIVITY)    EKG EKG Interpretation  Date/Time:  Saturday December 30 2020 13:21:52 EST Ventricular Rate:  83 PR Interval:  124 QRS Duration: 90 QT Interval:  392 QTC Calculation: 460 R Axis:   49 Text Interpretation: Normal sinus rhythm Normal ECG Confirmed by Fredia Sorrow [phone]) on 12/30/2020 7:06:21 PM   Radiology DG Chest 2 View  Result Date: 12/30/2020 CLINICAL DATA:  Chest pain.  Substance abuse. EXAM: CHEST - 2 VIEW COMPARISON:  11/20/2020 FINDINGS: The heart size and mediastinal contours are within normal limits. Both lungs are clear. The visualized skeletal structures are unremarkable. IMPRESSION: No active cardiopulmonary disease. Electronically Signed   By: Marlaine Hind M.D.   On: 12/30/2020 14:00    Procedures Procedures (including critical care time)  Medications Ordered in ED Medications  pantoprazole (PROTONIX) EC tablet 40 mg (40 mg Oral Given 12/30/20 1829)    ED Course  I have reviewed the triage vital signs and the nursing notes.  Pertinent labs & imaging results that were available during my care of the patient were reviewed by me and considered in my medical decision making (see chart for details).    MDM Rules/Calculators/A&P                         Pt's labs and imaging, ekg reviewed and discussed with pt.  No evidence for ACS associated with cocaine abuse. Also discussed that we cannot provide placement for substance abuse but he was given resources for obtaining this care.  He states he can return to [place], but not until day 21 after last admission.  Additional resources given. No evidence for withdrawal at this time.  Charge RN Shirlean Mylar contacted SW about homeless shelters for this pt.  No local shelter in [facility], closest is [facility] in [place], but no transportation services there.  Also, have to arrive by 8pm and out by 7 am.  He was given this information but this is not an option for him tonight.   Final  Clinical Impression(s) / ED Diagnoses Final diagnoses:  Chest pain, unspecified type  Cocaine abuse ([place])  ETOH abuse    Rx / DC Orders ED Discharge Orders    None       Landis Martins 12/30/20 1937    Fredia Sorrow, MD 12/31/20 2144

## 2020-12-31 ENCOUNTER — Ambulatory Visit (HOSPITAL_COMMUNITY)
Admission: EM | Admit: 2020-12-31 | Discharge: 2021-01-01 | Disposition: A | Discharge status: Home / Self Care | Payer: Medicare Other | Attending: Family | Admitting: Family

## 2020-12-31 ENCOUNTER — Other Ambulatory Visit: Payer: Self-pay

## 2020-12-31 ENCOUNTER — Ambulatory Visit (INDEPENDENT_AMBULATORY_CARE_PROVIDER_SITE_OTHER)
Admission: RE | Admit: 2020-12-31 | Discharge: 2020-12-31 | Disposition: A | Discharge status: Home / Self Care | Payer: Medicare Other | Source: Intra-hospital

## 2020-12-31 DIAGNOSIS — Z5900 Homelessness unspecified: Secondary | ICD-10-CM | POA: Insufficient documentation

## 2020-12-31 DIAGNOSIS — F329 Major depressive disorder, single episode, unspecified: Secondary | ICD-10-CM | POA: Diagnosis present

## 2020-12-31 DIAGNOSIS — F1029 Alcohol dependence with unspecified alcohol-induced disorder: Secondary | ICD-10-CM | POA: Diagnosis not present

## 2020-12-31 DIAGNOSIS — Z20822 Contact with and (suspected) exposure to covid-19: Secondary | ICD-10-CM | POA: Insufficient documentation

## 2020-12-31 DIAGNOSIS — F1410 Cocaine abuse, uncomplicated: Secondary | ICD-10-CM | POA: Diagnosis not present

## 2020-12-31 DIAGNOSIS — F1910 Other psychoactive substance abuse, uncomplicated: Secondary | ICD-10-CM | POA: Insufficient documentation

## 2020-12-31 LAB — POC SARS CORONAVIRUS 2 AG -  ED: SARS Coronavirus 2 Ag: NEGATIVE

## 2020-12-31 MED ORDER — ACETAMINOPHEN 325 MG PO TABS: 650.0000 mg | ORAL_TABLET | Freq: Four times a day (QID) | ORAL | Status: DC | PRN

## 2020-12-31 MED ORDER — MAGNESIUM HYDROXIDE 400 MG/5ML PO SUSP: 30.0000 mL | Freq: Every day | ORAL | Status: DC | PRN

## 2020-12-31 MED ORDER — LISINOPRIL 10 MG PO TABS
10.0000 mg | ORAL_TABLET | Freq: Once | ORAL | Status: AC
  Administered 2020-12-31: 10 mg via ORAL
  Filled 2020-12-31: qty 1

## 2020-12-31 MED ORDER — METFORMIN HCL 850 MG PO TABS
850.0000 mg | ORAL_TABLET | Freq: Once | ORAL | Status: AC
  Administered 2020-12-31: 850 mg via ORAL
  Filled 2020-12-31: qty 1

## 2020-12-31 MED ORDER — ALUM & MAG HYDROXIDE-SIMETH 200-200-20 MG/5ML PO SUSP: 30.0000 mL | ORAL | Status: DC | PRN

## 2020-12-31 MED ORDER — GABAPENTIN 300 MG PO CAPS
300.0000 mg | ORAL_CAPSULE | Freq: Two times a day (BID) | ORAL | Status: DC
  Administered 2020-12-31: 300 mg via ORAL
  Filled 2020-12-31: qty 1
  Filled 2020-12-31: qty 14

## 2020-12-31 MED ORDER — AMLODIPINE BESYLATE 5 MG PO TABS
5.0000 mg | ORAL_TABLET | Freq: Every day | ORAL | Status: DC
  Administered 2020-12-31 – 2021-01-01 (×2): 5 mg via ORAL
  Filled 2020-12-31 (×2): qty 1
  Filled 2020-12-31: qty 7

## 2020-12-31 NOTE — Discharge Summary (Signed)
[name] to be D/C'd Home per MD order.  Discussed with the patient and all questions fully answered.   An After Visit Summary was printed and given to the patient with referrals for outpatient treatment.   D/c education completed with patient/patient able to verbalize understanding, all questions fully answered.   Patient instructed to return to ED, call 911,for any changes in condition.  Patient received all belongings from locker and escorted to front lobby to complete registration.  Geraldo Docker 12/31/2020 12:22 PM

## 2020-12-31 NOTE — ED Notes (Signed)
Pt sleeping @this time. breathing even and unlabored. 

## 2020-12-31 NOTE — ED Provider Notes (Signed)
Behavioral Health Urgent Care Medical Screening Exam  Patient Name: [name] [name]. MRN: [id number] Date of Evaluation: 12/31/20 Chief Complaint:   Diagnosis:  Final diagnoses:  None    History of Present illness: [name] Karim. is a 60 y.o. male. presents voluntary to [facility] behavioral health urgent care center requesting resources for substance abuse treatment facility.  He is denying suicidal or homicidal ideations.  Denies auditory or visual hallucinations.  Reports " I think I need help with medications."  Currently is followed by Doctor Hoyle Barr in [place] at  [facility].  Patient was recently discharged from [place] [facility] with additional outpatient resources for homeless shelters.  CSW to provide additional outpatient resources for substance abuse treatment.  Support, encouragement reassurance was provided.  Noted on discharge summary1/07/2020 by Evalee Jefferson: Pt's labs and imaging, ekg reviewed and discussed with pt.  No evidence for ACS associated with cocaine abuse. Also discussed that we cannot provide placement for substance abuse but he was given resources for obtaining this care.  He states he can return to [place], but not until day 21 after last admission.  Additional resources given. No evidence for withdrawal at this time.  Charge RN Shirlean Mylar contacted SW about homeless shelters for this pt.  No local shelter in [facility], closest is [facility] in [place], but no transportation services there.  Also, have to arrive by 8pm and out by 7 am.  He was given this information but this is not an option for him tonight.    Psychiatric Specialty Exam  Presentation  General Appearance:Appropriate for Environment  Eye Contact:Good  Speech:Clear and Coherent  Speech Volume:Normal  Handedness:Right   Mood and Affect  Mood:Depressed  Affect:Congruent   Thought Process  Thought Processes:Coherent  Descriptions of Associations:Intact  Orientation:Full (Time, Place and  Person)  Thought Content:Logical  Hallucinations:None  Ideas of Reference:None  Suicidal Thoughts:No  Homicidal Thoughts:No   Sensorium  Memory:Remote Good; Immediate Good; Recent Good  Judgment:Good  Insight:Good   Executive Functions  Concentration:Good  Attention Span:Good  [place]  Language:Good   Psychomotor Activity  Psychomotor Activity:Normal   Assets  Assets:Communication Skills; Desire for Improvement; Resilience; Social Support   Sleep  Sleep:Fair  Number of hours: 6   Physical Exam: Physical Exam ROS Blood pressure (!) 145/105, pulse 77, temperature 98.4 F (36.9 C), temperature source Oral, resp. rate 18, SpO2 100 %. There is no height or weight on file to calculate BMI.  Musculoskeletal: Strength & Muscle Tone: within normal limits Gait & Station: normal Patient leans: N/A   [place] [facility] Discharge Disposition for Follow up and Recommendations: Based on my evaluation the patient does not appear to have an emergency medical condition and can be discharged with resources and follow up care in outpatient services for Substance Abuse Intensive Outpatient Program   Derrill [place], NP 12/31/2020, 12:02 PM

## 2020-12-31 NOTE — ED Provider Notes (Signed)
Behavioral Health Admission H&P [facility] & OBS)  Date: 12/31/20 Patient Name: [name] [name]. MRN: [id number] Chief Complaint: No chief complaint on file.     Diagnoses:  Final diagnoses:  None    HPI: History of Present illness: [name] [name]. is a 60 y.o. male. presents voluntary to [facility] behavioral health urgent care center requesting resources for substance abuse treatment facility.  He is denying suicidal or homicidal ideations.  Denies auditory or visual hallucinations.  Reports " I think I need help with medications."  Currently is followed by Doctor Hoyle Barr in [place] at  [place].  Patient was recently discharged from [facility] with additional outpatient resources for homeless shelters.  CSW to provide additional outpatient resources for substance abuse treatment.  Support, encouragement reassurance was provided.  19:00-Patient remained in lobby seeking transportation to [facility] facility, however it was reported that transportation is not working today and patient can be transported on 01/01/2021, patient then stated he was suicidal denying plan or intent. Will keep patient for overnight observation.   Noted on discharge summary1/07/2020 by Evalee Jefferson: Pt's labs and imaging, ekg reviewed and discussed with pt. No evidence for ACS associated with cocaine abuse. Also discussed that we cannot provide placement for substance abuse but he was given resources for obtaining this care. He states he can return to [facility], but not until day 21 after last admission. Additional resources given. No evidence for withdrawal at this time. Charge RN Shirlean Mylar contacted SW about homeless shelters for this pt. No local shelter in [facility], closest is [facility] in [place], but no transportation services there. Also, have to arrive by 8pm and out by 7 am. He was given this information but this is not an option for him tonight.   PHQ 2-9:  [place] ED from 12/31/2020 in [facility]  Thoughts that you would be better off dead, or of hurting yourself in some way Several days  [Phreesia 12/31/2020]  PHQ-9 Total Score 16      [place] [facility] ED from 12/31/2020 in [facility] ED from 12/30/2020 in [facility] No Risk No Risk       Total Time spent with patient: 15 minutes  Musculoskeletal  Strength & Muscle Tone: within normal limits Gait & Station: normal Patient leans: N/A  Psychiatric Specialty Exam  Presentation General Appearance: Appropriate for Environment  Eye Contact:Good  Speech:Clear and Coherent  Speech Volume:Normal  Handedness:Right   Mood and Affect  Mood:Depressed  Affect:Congruent   Thought Process  Thought Processes:Coherent  Descriptions of Associations:Intact  Orientation:Full (Time, Place and Person)  Thought Content:Logical  Hallucinations:Hallucinations: None  Ideas of Reference:None  Suicidal Thoughts:Suicidal Thoughts: No  Homicidal Thoughts:Homicidal Thoughts: No   Sensorium  Memory:Remote Good; Immediate Good; Recent Good  Judgment:Good  Insight:Good   Executive Functions  Concentration:Good  Attention Span:Good  [place]  Language:Good   Psychomotor Activity  Psychomotor Activity:Psychomotor Activity: Normal   Assets  Assets:Communication Skills; Desire for Improvement; Resilience; Social Support   Sleep  Sleep:Sleep: Fair   Physical Exam ROS  There were no vitals taken for this visit. There is no height or weight on file to calculate BMI.  Past Psychiatric History:    Is the patient at risk to self? No  Has the patient been a risk to self in the past 6 months? No .    Has the patient been a risk  to self within the distant past? No   Is the patient a risk to others? No   Has the patient been a risk to others in the past 6 months? No   Has the patient been a  risk to others within the distant past? No   Past Medical History:  Past Medical History:  Diagnosis Date  . Acid reflux   . Alcohol abuse    6-8 cans of beer daily; hx of incarceration for DWI  . Arthritis   . Bipolar disorder ([place])   . Bronchitis   . Chronic back pain   . Chronic neck pain   . Chronic pain   . Diabetes mellitus without complication ([employer])   . Fracture of lower leg   . Gout   . Hyperlipidemia   . Hypertension   . Left arm pain    chronic  . Migraine   . Pancreatitis    March 2013  . Pseudocyst of pancreas 02/28/2012  . Substance abuse [facility])     Past Surgical History:  Procedure Laterality Date  . CIRCUMCISION  03/17/2012   Procedure: CIRCUMCISION ADULT;  Surgeon: Marissa Nestle, MD;  Location: [facility]S;  Service: Urology;  Laterality: N/A;  . COLONOSCOPY WITH PROPOFOL  12/24/2012   [id number] lesion as described above status post biopsy; otherwise normal rectum/Sigmoid diverticulosis/Sigmoid polyps -- resected as described above. anal lesion, benign. colon polyp, hyperplastic  . ESOPHAGOGASTRODUODENOSCOPY (EGD) WITH PROPOFOL  12/24/2012   [id number] erythema and erosions of uncertain significance status post gastric biopsy (reactive gastropathy NO h.pylori)  . KNEE ARTHROSCOPY Right 08/09/2019   Procedure: arthroscopic incision and drainage snovectomy;  Surgeon: Paralee Cancel, MD;  Location: [facility] ORS;  Service: Orthopedics;  Laterality: Right;  . KNEE ARTHROSCOPY WITH MEDIAL MENISECTOMY Right 07/29/2019   Procedure: RIGHT KNEE ARTHROSCOPY WITH MEDIAL MENISCECTOMY AND LATERAL MENISCECTOMY;  Surgeon: Carole Civil, MD;  Location: [facility] ORS;  Service: Orthopedics;  Laterality: Right;  . NOSE SURGERY     broken nose  . TOTAL HIP ARTHROPLASTY Left 10/04/2016   Procedure: LEFT TOTAL HIP ARTHROPLASTY ANTERIOR APPROACH;  Surgeon: Mcarthur Rossetti, MD;  Location: [facility];  Service: Orthopedics;  Laterality: Left;    Family History:  Family History  Problem Relation  Age of Onset  . Diabetes Father   . Healthy Mother   . Anesthesia problems Neg Hx   . Hypotension Neg Hx   . Malignant hyperthermia Neg Hx   . Pseudochol deficiency Neg Hx   . Colon cancer Neg Hx   . Heart disease Neg Hx   . Stroke Neg Hx   . Cancer Neg Hx     Social History:  Social History   Socioeconomic History  . Marital status: Single    Spouse name: Not on file  . Number of children: Not on file  . Years of education: Not on file  . Highest education level: Not on file  Occupational History  . Occupation: unemployed  Tobacco Use  . Smoking status: Current Every Day Smoker    Packs/day: 0.50    Years: 25.00    Pack years: 12.50    Types: Cigarettes  . Smokeless tobacco: Former Systems developer    Types: Chew    Quit date: 12/23/2014  Vaping Use  . Vaping Use: Never used  Substance and Sexual Activity  . Alcohol use: Yes    Comment: heavily  . Drug use: Yes    Types: Cocaine, Marijuana  . Sexual activity: Not Currently  Other Topics Concern  . Not on file  Social History Narrative  . Not on file   Social Determinants of Health   Financial Resource Strain: Not on file  Food Insecurity: Not on file  Transportation Needs: Not on file  Physical Activity: Not on file  Stress: Not on file  Social Connections: Not on file  Intimate Partner Violence: Not on file    SDOH:  SDOH Screenings   Alcohol Screen: Not on file  Depression (PHQ2-9): Medium Risk  . PHQ-2 Score: 16  Financial Resource Strain: Not on file  Food Insecurity: Not on file  Housing: Not on file  Physical Activity: Not on file  Social Connections: Not on file  Stress: Not on file  Tobacco Use: High Risk  . Smoking Tobacco Use: Current Every Day Smoker  . Smokeless Tobacco Use: Former Soil scientist Needs: Not on file    Last Labs:  Admission on 12/30/2020, Discharged on 12/30/2020  Component Date Value Ref Range Status  . Sodium 12/30/2020 134* 135 - 145 mmol/L Final  . Potassium  12/30/2020 3.7  3.5 - 5.1 mmol/L Final  . Chloride 12/30/2020 101  98 - 111 mmol/L Final  . CO2 12/30/2020 21* 22 - 32 mmol/L Final  . Glucose, Bld 12/30/2020 81  70 - 99 mg/dL Final   Glucose reference range applies only to samples taken after fasting for at least 8 hours.  . BUN 12/30/2020 15  6 - 20 mg/dL Final  . Creatinine, Ser 12/30/2020 1.18  0.61 - 1.24 mg/dL Final  . Calcium 12/30/2020 8.7* 8.9 - 10.3 mg/dL Final  . Total Protein 12/30/2020 7.3  6.5 - 8.1 g/dL Final  . Albumin 12/30/2020 4.2  3.5 - 5.0 g/dL Final  . AST 12/30/2020 67* 15 - 41 U/L Final  . ALT 12/30/2020 30  0 - 44 U/L Final  . Alkaline Phosphatase 12/30/2020 37* 38 - 126 U/L Final  . Total Bilirubin 12/30/2020 0.6  0.3 - 1.2 mg/dL Final  . GFR, Estimated 12/30/2020 >60  >60 mL/min Final   Comment: (NOTE) Calculated using the CKD-EPI Creatinine Equation (2021)   . Anion gap 12/30/2020 12  5 - 15 Final   Performed at [facility], [address] [place], [place] [address]  . Alcohol, Ethyl (B) 12/30/2020 17* <10 mg/dL Final   Comment: (NOTE) Lowest detectable limit for serum alcohol is 10 mg/dL.  For medical purposes only. Performed at [facility], [address] [place], [place] [address]   . WBC 12/30/2020 4.4  4.0 - 10.5 K/uL Final  . RBC 12/30/2020 4.85  4.22 - 5.81 MIL/uL Final  . Hemoglobin 12/30/2020 15.0  13.0 - 17.0 g/dL Final  . HCT 12/30/2020 44.4  39.0 - 52.0 % Final  . MCV 12/30/2020 91.5  80.0 - 100.0 fL Final  . MCH 12/30/2020 30.9  26.0 - 34.0 pg Final  . MCHC 12/30/2020 33.8  30.0 - 36.0 g/dL Final  . RDW 12/30/2020 14.9  11.5 - 15.5 % Final  . Platelets 12/30/2020 314  150 - 400 K/uL Final  . nRBC 12/30/2020 0.0  0.0 - 0.2 % Final   Performed at [facility], [address], [place], [place] [address]  . Opiates 12/30/2020 NONE DETECTED  NONE DETECTED Final  . Cocaine 12/30/2020 POSITIVE* NONE DETECTED Final  . Benzodiazepines 12/30/2020 NONE DETECTED  NONE DETECTED Final  .  Amphetamines 12/30/2020 NONE DETECTED  NONE DETECTED Final  . Tetrahydrocannabinol 12/30/2020 NONE DETECTED  NONE  DETECTED Final  . Barbiturates 12/30/2020 NONE DETECTED  NONE DETECTED Final   Comment: (NOTE) DRUG SCREEN FOR MEDICAL PURPOSES ONLY.  IF CONFIRMATION IS NEEDED FOR ANY PURPOSE, NOTIFY LAB WITHIN 5 DAYS.  LOWEST DETECTABLE LIMITS FOR URINE DRUG SCREEN Drug Class                     Cutoff (ng/mL) Amphetamine and metabolites    1000 Barbiturate and metabolites    200 Benzodiazepine                 [id number] Tricyclics and metabolites     300 Opiates and metabolites        300 Cocaine and metabolites        300 THC                            50 Performed at [facility], [address], [place], [place] [address]   . Troponin I (High Sensitivity) 12/30/2020 7  <18 ng/L Final   Comment: (NOTE) Elevated high sensitivity troponin I (hsTnI) values and significant  changes across serial measurements may suggest ACS but many other  chronic and acute conditions are known to elevate hsTnI results.  Refer to the "Links" section for chest pain algorithms and additional  guidance. Performed at [facility], [address], [place], [place] [address]   Admission on 12/12/2020, Discharged on 12/12/2020  Component Date Value Ref Range Status  . Glucose-Capillary 12/12/2020 87  70 - 99 mg/dL Final   Glucose reference range applies only to samples taken after fasting for at least 8 hours.  Admission on 11/20/2020, Discharged on 11/21/2020  Component Date Value Ref Range Status  . Sodium 11/20/2020 139  135 - 145 mmol/L Final  . Potassium 11/20/2020 3.5  3.5 - 5.1 mmol/L Final  . Chloride 11/20/2020 105  98 - 111 mmol/L Final  . CO2 11/20/2020 24  22 - 32 mmol/L Final  . Glucose, Bld 11/20/2020 105* 70 - 99 mg/dL Final   Glucose reference range applies only to samples taken after fasting for at least 8 hours.  . BUN 11/20/2020 23* 6 - 20 mg/dL Final  . Creatinine, Ser 11/20/2020 1.04   0.61 - 1.24 mg/dL Final  . Calcium 11/20/2020 9.1  8.9 - 10.3 mg/dL Final  . GFR, Estimated 11/20/2020 >60  >60 mL/min Final   Comment: (NOTE) Calculated using the CKD-EPI Creatinine Equation (2021)   . Anion gap 11/20/2020 10  5 - 15 Final   Performed at [facility], [address], [place], [place] [address]  . WBC 11/20/2020 6.0  4.0 - 10.5 K/uL Final  . RBC 11/20/2020 4.75  4.22 - 5.81 MIL/uL Final  . Hemoglobin 11/20/2020 14.7  13.0 - 17.0 g/dL Final  . HCT 11/20/2020 44.7  39.0 - 52.0 % Final  . MCV 11/20/2020 94.1  80.0 - 100.0 fL Final  . MCH 11/20/2020 30.9  26.0 - 34.0 pg Final  . MCHC 11/20/2020 32.9  30.0 - 36.0 g/dL Final  . RDW 11/20/2020 14.6  11.5 - 15.5 % Final  . Platelets 11/20/2020 289  150 - 400 K/uL Final  . nRBC 11/20/2020 0.0  0.0 - 0.2 % Final   Performed at [facility], [address], [place], [place] [address]  . Troponin I (High Sensitivity) 11/20/2020 7  <18 ng/L Final   Comment: (NOTE) Elevated high sensitivity troponin I (hsTnI) values and significant  changes across serial measurements may suggest ACS but many other  chronic and acute conditions are known to elevate hsTnI results.  Refer to the "Links" section for chest pain algorithms and additional  guidance. Performed at [facility], [address] [place], [place] [address]   . Glucose-Capillary 11/21/2020 83  70 - 99 mg/dL Final   Glucose reference range applies only to samples taken after fasting for at least 8 hours.  . Glucose-Capillary 11/20/2020 94  70 - 99 mg/dL Final   Glucose reference range applies only to samples taken after fasting for at least 8 hours.  . Troponin I (High Sensitivity) 11/20/2020 7  <18 ng/L Final   Comment: (NOTE) Elevated high sensitivity troponin I (hsTnI) values and significant  changes across serial measurements may suggest ACS but many other  chronic and acute conditions are known to elevate hsTnI results.  Refer to the "Links" section for chest pain  algorithms and additional  guidance. Performed at [facility], [address] [place], [place] [address]   . Opiates 11/21/2020 NONE DETECTED  NONE DETECTED Final  . Cocaine 11/21/2020 POSITIVE* NONE DETECTED Final  . Benzodiazepines 11/21/2020 POSITIVE* NONE DETECTED Final  . Amphetamines 11/21/2020 NONE DETECTED  NONE DETECTED Final  . Tetrahydrocannabinol 11/21/2020 NONE DETECTED  NONE DETECTED Final  . Barbiturates 11/21/2020 NONE DETECTED  NONE DETECTED Final   Comment: (NOTE) DRUG SCREEN FOR MEDICAL PURPOSES ONLY.  IF CONFIRMATION IS NEEDED FOR ANY PURPOSE, NOTIFY LAB WITHIN 5 DAYS.  LOWEST DETECTABLE LIMITS FOR URINE DRUG SCREEN Drug Class                     Cutoff (ng/mL) Amphetamine and metabolites    1000 Barbiturate and metabolites    200 Benzodiazepine                 [id number] Tricyclics and metabolites     300 Opiates and metabolites        300 Cocaine and metabolites        300 THC                            50 Performed at [facility], [address], [place], [place] [address]   Admission on 11/11/2020, Discharged on 11/11/2020  Component Date Value Ref Range Status  . SARS Coronavirus 2 Ag 11/11/2020 Negative  Negative Final  . SARS Coronavirus 2 Ag 11/11/2020 NEGATIVE  NEGATIVE Final   Comment: (NOTE) SARS-CoV-2 antigen NOT DETECTED.   Negative results are presumptive.  Negative results do not preclude SARS-CoV-2 infection and should not be used as the sole basis for treatment or other patient management decisions, including infection  control decisions, particularly in the presence of clinical signs and  symptoms consistent with COVID-19, or in those who have been in contact with the virus.  Negative results must be combined with clinical observations, patient history, and epidemiological information. The expected result is Negative.  Fact Sheet for Patients: PodPark.tn  Fact Sheet for Healthcare  Providers: GiftContent.is   This test is not yet approved or cleared by the [place] FDA and  has been authorized for detection and/or diagnosis of SARS-CoV-2 by FDA under an Emergency Use Authorization (EUA).  This EUA will remain in effect (meaning this test can be used) for the duration of  the C  OVID-19 declaration under Section 564(b)(1) of the Act, 21 U.S.C. section 360bbb-3(b)(1), unless the authorization is terminated or revoked sooner.    Admission on 11/11/2020, Discharged on 11/11/2020  Component Date Value Ref Range Status  . Sodium 11/11/2020 136  135 - 145 mmol/L Final  . Potassium 11/11/2020 3.7  3.5 - 5.1 mmol/L Final  . Chloride 11/11/2020 101  98 - 111 mmol/L Final  . CO2 11/11/2020 25  22 - 32 mmol/L Final  . Glucose, Bld 11/11/2020 108* 70 - 99 mg/dL Final   Glucose reference range applies only to samples taken after fasting for at least 8 hours.  . BUN 11/11/2020 13  6 - 20 mg/dL Final  . Creatinine, Ser 11/11/2020 0.79  0.61 - 1.24 mg/dL Final  . Calcium 11/11/2020 8.7* 8.9 - 10.3 mg/dL Final  . Total Protein 11/11/2020 7.3  6.5 - 8.1 g/dL Final  . Albumin 11/11/2020 4.2  3.5 - 5.0 g/dL Final  . AST 11/11/2020 35  15 - 41 U/L Final  . ALT 11/11/2020 22  0 - 44 U/L Final  . Alkaline Phosphatase 11/11/2020 30* 38 - 126 U/L Final  . Total Bilirubin 11/11/2020 0.8  0.3 - 1.2 mg/dL Final  . GFR, Estimated 11/11/2020 >60  >60 mL/min Final   Comment: (NOTE) Calculated using the CKD-EPI Creatinine Equation (2021)   . Anion gap 11/11/2020 10  5 - 15 Final   Performed at [facility], [place] [address], [place], [place] [address]  . Alcohol, Ethyl (B) 11/11/2020 <10  <10 mg/dL Final   Comment: (NOTE) Lowest detectable limit for serum alcohol is 10 mg/dL.  For medical purposes only. Performed at [facility], [place] [address], [place], [place] [address]   . WBC 11/11/2020  6.4  4.0 - 10.5 K/uL Final  . RBC 11/11/2020 4.88  4.22 - 5.81 MIL/uL Final  . Hemoglobin 11/11/2020 15.4  13.0 - 17.0 g/dL Final  . HCT 11/11/2020 45.8  39.0 - 52.0 % Final  . MCV 11/11/2020 93.9  80.0 - 100.0 fL Final  . MCH 11/11/2020 31.6  26.0 - 34.0 pg Final  . MCHC 11/11/2020 33.6  30.0 - 36.0 g/dL Final  . RDW 11/11/2020 14.6  11.5 - 15.5 % Final  . Platelets 11/11/2020 388  150 - 400 K/uL Final  . nRBC 11/11/2020 0.0  0.0 - 0.2 % Final   Performed at [facility], [place] [address], [place], [place] [address]  . Opiates 11/11/2020 NONE DETECTED  NONE DETECTED Final  . Cocaine 11/11/2020 POSITIVE* NONE DETECTED Final  . Benzodiazepines 11/11/2020 NONE DETECTED  NONE DETECTED Final  . Amphetamines 11/11/2020 NONE DETECTED  NONE DETECTED Final  . Tetrahydrocannabinol 11/11/2020 POSITIVE* NONE DETECTED Final  . Barbiturates 11/11/2020 NONE DETECTED  NONE DETECTED Final   Comment: (NOTE) DRUG SCREEN FOR MEDICAL PURPOSES ONLY.  IF CONFIRMATION IS NEEDED FOR ANY PURPOSE, NOTIFY LAB WITHIN 5 DAYS.  LOWEST DETECTABLE LIMITS FOR URINE DRUG SCREEN Drug Class                     Cutoff (ng/mL) Amphetamine and metabolites    1000 Barbiturate and metabolites    200 Benzodiazepine                 846 Tricyclics and metabolites     300 Opiates and metabolites        300 Cocaine and metabolites        300 THC  50 Performed at [facility], [place] [address], [place], [place] [address]     Allergies: Bee venom and Penicillins  PTA Medications: (Not in a hospital admission)   Medical Decision Making  OVERNIGHT Obs -medications was restarted where appropriate     Recommendations  Based on my evaluation the patient does not appear to have an emergency medical condition.  Derrill Center, NP 12/31/20  7:08 PM
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
## 2020-12-31 NOTE — Discharge Instructions (Addendum)
Take all medications as prescribed. Keep all follow-up appointments as scheduled.  Do not consume alcohol or use illegal drugs while on prescription medications. Report any adverse effects from your medications to your primary care provider promptly.  In the event of recurrent symptoms or worsening symptoms, call 911, a crisis hotline, or go to the nearest emergency department for evaluation.   

## 2020-12-31 NOTE — ED Triage Notes (Signed)
Patient states he is depressed and has a drug and alcohol problem. Patient denies SI/HI but does have auditory hallucinations at times. Patient uses alcohol, cocaine and marijuana. Last used all of them yesterday. Patient is wanted to go inpatient for detox.

## 2020-12-31 NOTE — Progress Notes (Addendum)
Substance abuse resources and Residential Options:  [facility] day residential substance abuse facility (not an option if you have active assault charges). [address], [place], [place] [address] Phone: [phone]: Ask for Myrlene Broker in admissions to complete intake if interested in pursuing this option.  [facility]: Can get intake scheduled; (not an option if you have active assault charges). [place] [address] [place], [place] [phone]) Call Mon-Fri.  [facility]): (offers outpatient therapy and intensive outpatient substance abuse therapy).  [address] [place], [place] [address] Phone: [phone]  [facility]: Offers FREE recovery skills classes, support groups, 1:1 Peer Support, and Compeer Classes. [address], [place], [place] [address] Phone: [phone] (Call to complete intake).   [facility] [address] [place], [place] [address] Phone: [phone] ext [phone]  [facility]: spoke to admissions--no bed availability today. They recommended that Kealii contact them directly to do phone intake and get on the waitlist if he has not yet done so. **   [address] [place], [place] [address] Hours:  Open 24 hours Phone: [phone]   The [facility] [facility] provides food, shelter and other programs and services to the homeless men of [place] through our [employer].  By offering safe shelter, three meals a day, clean clothing, Biblical counseling, financial planning, vocational training, GED/education and employment assistance, we've helped mend the shattered lives of many homeless men since opening in 1974.  We have approximately 267 beds available, with a max of 312 beds including mats for emergency situations and currently house an average of 270 men a night.  Prospective Client Check-In Information Photo ID Required (State/ Out of State/ [facility]) - if photo ID is  not available, clients are required to have a printout of a police/sheriff's criminal history report. Help out with chores around the [place]. No sex offender of any type (pending, charged, registered and/or any other sex related offenses) will be permitted to check in. Must be willing to abide by all rules, regulations, and policies established by the [facility]. The following will be provided - shelter, food, clothing, and biblical counseling. If you or someone you know is in need of assistance at our [facility] shelter in [place], [place], please call [phone] ext. [phone].   [place] [facility] [place], [place] [address] PHONE: [phone] Hours: Monday-Friday 8:00AM-3:00PM  This agency offers the following services for free:   *Integrated Care -Case management -[place] clinic -Mental health nurse -Referrals  *Fundamental Services -Showers and hygiene supplies -Laundry -Phone access -Mailing addresses and mailboxes -Replacement IDs -Onsite barbershop -Storage lockers -[employer] winter warming center  *Librarian, academic -Skilled trade classes -Job skills classes -Resume and jobs application assistance -Interview training -GED classes -Forensic scientist vouchers -Film/video editor S. Ouida Sills, MSW, LCSW Clinical Social Worker 12/31/2020 12:04 PM

## 2020-12-31 NOTE — Discharge Instructions (Signed)
Substance abuse resources and Residential Options:  [facility]: spoke to [employer] bed availability today. They recommended that Brice contact them directly to do phone intake and get on the waitlist if he has not yet done so. ** [address] [place], [place] [address] Hours:  Open 24 hours Phone: [phone]   [facility] day residential substance abuse facility (not an option if you have active assault charges). [address] [place], [place] [address] Phone: [phone]: Ask for Myrlene Broker in admissions to complete intake if interested in pursuing this option.  [facility]: Can get intake scheduled; (not an option if you have active assault charges). [place] [address] [place], [place] [phone]) Call Mon-Fri.  [facility] ([facility]): (offers outpatient therapy and intensive outpatient substance abuse therapy).  [address], [place], [place] [address] Phone: [phone]  [facility]: Offers FREE recovery skills classes, support groups, 1:1 Peer Support, and Compeer Classes. [address] [place], [place] [address] Phone: [phone] (Call to complete intake).   [facility] [place], [place] [address] Phone: [phone] ext [phone]  The [facility] provides food, shelter and other programs and services to the homeless men of [place] through our [employer] program.  By offering safe shelter, three meals a day, clean clothing, Biblical counseling, financial planning, vocational training, GED/education and employment assistance, we've helped mend the shattered lives of many homeless men since opening in 1974.  We have approximately 267 beds available, with a max of 312 beds including mats for emergency situations and currently house an average of 270 men a night.  Prospective Client Check-In Information Photo ID Required (State/ Out of State/ [facility]) - if photo ID is  not available, clients are required to have a printout of a police/sheriff's criminal history report. Help out with chores around the [place]. No sex offender of any type (pending, charged, registered and/or any other sex related offenses) will be permitted to check in. Must be willing to abide by all rules, regulations, and policies established by the [employer]. The following will be provided - shelter, food, clothing, and biblical counseling. If you or someone you know is in need of assistance at our [facility] shelter in [place], [place], please call [phone] ext. [phone].   [place] [facility] [place], [place] [address] PHONE: [phone] Hours: Monday-Friday 8:00AM-3:00PM  This agency offers the following services for free:   *Integrated Care -Case management -[place] clinic -Mental health nurse -Referrals  *Fundamental Services -Showers and hygiene supplies -Laundry -Phone access -Mailing addresses and mailboxes -Replacement IDs -Onsite barbershop -Storage lockers -[employer] winter warming center  *Librarian, academic -Skilled trade classes -Job skills classes -Resume and jobs application assistance -Interview training -GED classes -Psychiatrist -Chemical engineer

## 2021-01-01 DIAGNOSIS — F1029 Alcohol dependence with unspecified alcohol-induced disorder: Secondary | ICD-10-CM | POA: Diagnosis not present

## 2021-01-01 MED ORDER — LISINOPRIL 10 MG PO TABS: 10.0000 mg | ORAL_TABLET | Freq: Every day | ORAL | 0 refills | Status: DC

## 2021-01-01 MED ORDER — GABAPENTIN 300 MG PO CAPS
300.0000 mg | ORAL_CAPSULE | Freq: Three times a day (TID) | ORAL | Status: DC
Start: 2021-01-01 — End: 2021-01-01
  Administered 2021-01-01: 300 mg via ORAL
  Filled 2021-01-01: qty 21
  Filled 2021-01-01: qty 1

## 2021-01-01 MED ORDER — TAMSULOSIN HCL 0.4 MG PO CAPS
0.4000 mg | ORAL_CAPSULE | Freq: Every day | ORAL | 0 refills | Status: DC
Start: 2021-01-01 — End: 2021-05-04

## 2021-01-01 MED ORDER — GABAPENTIN 300 MG PO CAPS: 300.0000 mg | ORAL_CAPSULE | Freq: Three times a day (TID) | ORAL | Status: DC

## 2021-01-01 MED ORDER — TAMSULOSIN HCL 0.4 MG PO CAPS
0.4000 mg | ORAL_CAPSULE | Freq: Every day | ORAL | Status: DC
  Administered 2021-01-01: 0.4 mg via ORAL
  Filled 2021-01-01: qty 1
  Filled 2021-01-01: qty 7

## 2021-01-01 MED ORDER — GABAPENTIN 300 MG PO CAPS: 300.0000 mg | ORAL_CAPSULE | Freq: Three times a day (TID) | ORAL | 0 refills | Status: DC

## 2021-01-01 MED ORDER — METFORMIN HCL 850 MG PO TABS
850.0000 mg | ORAL_TABLET | Freq: Every day | ORAL | Status: DC
  Filled 2021-01-01: qty 7

## 2021-01-01 MED ORDER — PANTOPRAZOLE SODIUM 40 MG PO TBEC
40.0000 mg | DELAYED_RELEASE_TABLET | Freq: Every day | ORAL | 0 refills | Status: DC
Start: 2021-01-02 — End: 2021-05-04

## 2021-01-01 MED ORDER — CITALOPRAM HYDROBROMIDE 20 MG PO TABS: 20.0000 mg | ORAL_TABLET | Freq: Every day | ORAL | 0 refills | Status: DC

## 2021-01-01 MED ORDER — CITALOPRAM HYDROBROMIDE 20 MG PO TABS
20.0000 mg | ORAL_TABLET | Freq: Every day | ORAL | Status: DC
  Administered 2021-01-01: 20 mg via ORAL
  Filled 2021-01-01: qty 1
  Filled 2021-01-01: qty 7

## 2021-01-01 MED ORDER — TRAZODONE HCL 50 MG PO TABS
50.0000 mg | ORAL_TABLET | Freq: Every evening | ORAL | Status: DC | PRN
  Filled 2021-01-01 (×2): qty 1

## 2021-01-01 MED ORDER — AMLODIPINE BESYLATE 5 MG PO TABS: 5.0000 mg | ORAL_TABLET | Freq: Every day | ORAL | 0 refills | Status: DC

## 2021-01-01 MED ORDER — TRAZODONE HCL 50 MG PO TABS: 50.0000 mg | ORAL_TABLET | Freq: Every evening | ORAL | 0 refills | Status: DC | PRN

## 2021-01-01 MED ORDER — METFORMIN HCL 850 MG PO TABS: 850.0000 mg | ORAL_TABLET | Freq: Every day | ORAL | 0 refills | Status: DC

## 2021-01-01 MED ORDER — LISINOPRIL 10 MG PO TABS
10.0000 mg | ORAL_TABLET | Freq: Every day | ORAL | Status: DC
  Administered 2021-01-01: 10 mg via ORAL
  Filled 2021-01-01: qty 1
  Filled 2021-01-01: qty 7

## 2021-01-01 MED ORDER — PANTOPRAZOLE SODIUM 40 MG PO TBEC
40.0000 mg | DELAYED_RELEASE_TABLET | Freq: Every day | ORAL | Status: DC
  Administered 2021-01-01: 40 mg via ORAL
  Filled 2021-01-01: qty 1
  Filled 2021-01-01: qty 7

## 2021-01-01 NOTE — ED Notes (Signed)
Pt awake sitting on side of bed. Ambulating per self on unit. A&O x4. Breakfast given. No new issues noted. No s/s pain, discomfort, or acute distress. Will continue to monitor for safety

## 2021-01-01 NOTE — ED Provider Notes (Signed)
FBC/OBS ASAP Discharge Summary  Date and Time: 01/01/2021 12:17 PM  Name: [name] [name].  MRN:  [id number]   Discharge Diagnoses:  Final diagnoses:  Alcohol dependence with unspecified alcohol-induced disorder ([place])    Subjective: Patient reports today that he is feeling better.  He denies any suicidal or homicidal ideations and denies any hallucinations.  Patient continues to report that he was told that [facility] and states full and that he needs to contact them this morning.  After numerous phone calls throughout the day the patient did not have a bed at [facility] and states full.  All other facilities either declined the patient or there were no available beds.  Patient was informed of the situation and he agreed to go to the [facility].  Patient is provided with 30-day prescriptions and 7-day samples of his medications.  Stay Summary: Patient is a 60 year old male who presented voluntarily to the [facility] requesting services for substance abuse treatment and requesting assistance with getting to a substance abuse treatment facility.  Patient had denied any suicidal or homicidal ideations and denied any hallucinations.  Patient was requesting to be restarted on medications as well as get into substance abuse treatment.  Patient was admitted to the [facility] continuous observation unit for overnight assessment.  This morning it was found that the patient did not have a bed at any facility that he had reported that he had contacted yesterday.  Patient was either declined at the facilities are there were no available beds as they were full.  Patient was informed of this and patient stated that he would like to go to the [facility] as that was an option.  Patient continued to deny any suicidal homicidal ideations and denied any hallucinations.  Patient is provided with 30-day prescriptions and 7-day samples of his medications and is provided with transportation via safe  transport to the [facility].  Patient stated that he was interested in the victory program that is at [facility] rescue mission.  Total Time spent with patient: 30 minutes  Past Psychiatric History: Bipolar disorder, MDD, polysubstance absue Past Medical History:  Past Medical History:  Diagnosis Date  . Acid reflux   . Alcohol abuse    6-8 cans of beer daily; hx of incarceration for DWI  . Arthritis   . Bipolar disorder ([place])   . Bronchitis   . Chronic back pain   . Chronic neck pain   . Chronic pain   . Diabetes mellitus without complication ([place])   . Fracture of lower leg   . Gout   . Hyperlipidemia   . Hypertension   . Left arm pain    chronic  . Migraine   . Pancreatitis    March 2013  . Pseudocyst of pancreas 02/28/2012  . Substance abuse [facility])     Past Surgical History:  Procedure Laterality Date  . CIRCUMCISION  03/17/2012   Procedure: CIRCUMCISION ADULT;  Surgeon: Marissa Nestle, MD;  Location: [facility];  Service: Urology;  Laterality: N/A;  . COLONOSCOPY WITH PROPOFOL  12/24/2012   [id number] lesion as described above status post biopsy; otherwise normal rectum/Sigmoid diverticulosis/Sigmoid polyps -- resected as described above. anal lesion, benign. colon polyp, hyperplastic  . ESOPHAGOGASTRODUODENOSCOPY (EGD) WITH PROPOFOL  12/24/2012   [id number] erythema and erosions of uncertain significance status post gastric biopsy (reactive gastropathy NO h.pylori)  . KNEE ARTHROSCOPY Right 08/09/2019   Procedure: arthroscopic incision  and drainage snovectomy;  Surgeon: Paralee Cancel, MD;  Location: [facility];  Service: Orthopedics;  Laterality: Right;  . KNEE ARTHROSCOPY WITH MEDIAL MENISECTOMY Right 07/29/2019   Procedure: RIGHT KNEE ARTHROSCOPY WITH MEDIAL MENISCECTOMY AND LATERAL MENISCECTOMY;  Surgeon: Carole Civil, MD;  Location: [facility];  Service: Orthopedics;  Laterality: Right;  . NOSE SURGERY     broken nose  . TOTAL HIP ARTHROPLASTY Left 10/04/2016    Procedure: LEFT TOTAL HIP ARTHROPLASTY ANTERIOR APPROACH;  Surgeon: Mcarthur Rossetti, MD;  Location: [facility];  Service: Orthopedics;  Laterality: Left;   Family History:  Family History  Problem Relation Age of Onset  . Diabetes Father   . Healthy Mother   . Anesthesia problems Neg Hx   . Hypotension Neg Hx   . Malignant hyperthermia Neg Hx   . Pseudochol deficiency Neg Hx   . Colon cancer Neg Hx   . Heart disease Neg Hx   . Stroke Neg Hx   . Cancer Neg Hx    Family Psychiatric History: Noone reported Social History:  Social History   Substance and Sexual Activity  Alcohol Use Yes   Comment: heavily     Social History   Substance and Sexual Activity  Drug Use Yes  . Types: Cocaine, Marijuana    Social History   Socioeconomic History  . Marital status: Single    Spouse name: Not on file  . Number of children: Not on file  . Years of education: Not on file  . Highest education level: Not on file  Occupational History  . Occupation: unemployed  Tobacco Use  . Smoking status: Current Every Day Smoker    Packs/day: 0.50    Years: 25.00    Pack years: 12.50    Types: Cigarettes  . Smokeless tobacco: Former Systems developer    Types: Chew    Quit date: 12/23/2014  Vaping Use  . Vaping Use: Never used  Substance and Sexual Activity  . Alcohol use: Yes    Comment: heavily  . Drug use: Yes    Types: Cocaine, Marijuana  . Sexual activity: Not Currently  Other Topics Concern  . Not on file  Social History Narrative  . Not on file   Social Determinants of Health   Financial Resource Strain: Not on file  Food Insecurity: Not on file  Transportation Needs: Not on file  Physical Activity: Not on file  Stress: Not on file  Social Connections: Not on file   SDOH:  SDOH Screenings   Alcohol Screen: Not on file  Depression (PHQ2-9): Medium Risk  . PHQ-2 Score: 16  Financial Resource Strain: Not on file  Food Insecurity: Not on file  Housing: Not on file  Physical  Activity: Not on file  Social Connections: Not on file  Stress: Not on file  Tobacco Use: High Risk  . Smoking Tobacco Use: Current Every Day Smoker  . Smokeless Tobacco Use: Former Soil scientist Needs: Not on file    Has this patient used any form of tobacco in the last 30 days? (Cigarettes, Smokeless Tobacco, Cigars, and/or Pipes) A prescription for an FDA-approved tobacco cessation medication was offered at discharge and the patient refused  Current Medications:  Current Facility-Administered Medications  Medication Dose Route Frequency Provider Last Rate Last Admin  . acetaminophen (TYLENOL) tablet 650 mg  650 mg Oral Q6H PRN Derrill Center, NP      . alum & mag hydroxide-simeth (MAALOX/MYLANTA) 200-200-20 MG/5ML suspension 30 mL  30 mL Oral Q4H PRN Derrill Center, NP      . amLODipine (NORVASC) tablet 5 mg  5 mg Oral Daily Derrill Center, NP   5 mg at 01/01/21 0917  . citalopram (CELEXA) tablet 20 mg  20 mg Oral Daily Danamarie Minami, Lowry Ram, FNP   20 mg at 01/01/21 0920  . gabapentin (NEURONTIN) capsule 300 mg  300 mg Oral TID Giannamarie Paulus, Lowry Ram, FNP   300 mg at 01/01/21 0917  . lisinopril (ZESTRIL) tablet 10 mg  10 mg Oral Daily Marquez Ceesay, Lowry Ram, FNP   10 mg at 01/01/21 1017  . magnesium hydroxide (MILK OF MAGNESIA) suspension 30 mL  30 mL Oral Daily PRN Derrill Center, NP      . Derrill Memo ON 01/02/2021] metFORMIN (GLUCOPHAGE) tablet 850 mg  850 mg Oral Q breakfast Khaleed Holan, Lowry Ram, FNP      . pantoprazole (PROTONIX) EC tablet 40 mg  40 mg Oral Daily Brody Bonneau, Lowry Ram, FNP   40 mg at 01/01/21 0917  . tamsulosin (FLOMAX) capsule 0.4 mg  0.4 mg Oral Daily Leonie Amacher, Darnelle Maffucci B, FNP   0.4 mg at 01/01/21 1016  . traZODone (DESYREL) tablet 50 mg  50 mg Oral QHS PRN Peyten Weare, Lowry Ram, FNP       Current Outpatient Medications  Medication Sig Dispense Refill  . amLODipine (NORVASC) 5 MG tablet Take 1 tablet (5 mg total) by mouth daily. 30 tablet 0  . citalopram (CELEXA) 20 MG tablet Take 1 tablet (20 mg  total) by mouth daily. 30 tablet 0  . gabapentin (NEURONTIN) 300 MG capsule Take 1 capsule (300 mg total) by mouth 3 (three) times daily. 90 capsule 0  . lisinopril (ZESTRIL) 10 MG tablet Take 1 tablet (10 mg total) by mouth daily. 30 tablet 0  . metFORMIN (GLUCOPHAGE) 850 MG tablet Take 1 tablet (850 mg total) by mouth daily with breakfast. 30 tablet 0  . [START ON 01/02/2021] pantoprazole (PROTONIX) 40 MG tablet Take 1 tablet (40 mg total) by mouth daily. 30 tablet 0  . tamsulosin (FLOMAX) 0.4 MG CAPS capsule Take 1 capsule (0.4 mg total) by mouth daily. 30 capsule 0  . traZODone (DESYREL) 50 MG tablet Take 1 tablet (50 mg total) by mouth at bedtime as needed for sleep. 30 tablet 0    PTA Medications: (Not in a hospital admission)   Musculoskeletal  Strength & Muscle Tone: within normal limits Gait & Station: normal Patient leans: N/A  Psychiatric Specialty Exam  Presentation  General Appearance: Appropriate for Environment; Casual  Eye Contact:Good  Speech:Clear and Coherent; Normal Rate  Speech Volume:Normal  Handedness:Right   Mood and Affect  Mood:Euthymic  Affect:Appropriate; Congruent   Thought Process  Thought Processes:Coherent  Descriptions of Associations:Intact  Orientation:Full (Time, Place and Person)  Thought Content:WDL  Hallucinations:Hallucinations: None  Ideas of Reference:None  Suicidal Thoughts:Suicidal Thoughts: No  Homicidal Thoughts:Homicidal Thoughts: No   Sensorium  Memory:Immediate Good; Recent Good; Remote Good  Judgment:Good  Insight:Good   Executive Functions  Concentration:Good  Attention Span:Good  [place] of Knowledge:Good  Language:Good   Psychomotor Activity  Psychomotor Activity:Psychomotor Activity: Normal   Assets  Assets:Communication Skills; Desire for Improvement; Financial Resources/Insurance; Resilience; Social Support   Sleep  Sleep:Sleep: Good   Physical Exam  Physical  Exam Vitals and nursing note reviewed.  Constitutional:      Appearance: He is well-developed.  HENT:     Head: Normocephalic.  Eyes:     Pupils: Pupils  are equal, round, and reactive to light.  Cardiovascular:     Rate and Rhythm: Normal rate.  Pulmonary:     Effort: Pulmonary effort is normal.  Musculoskeletal:        General: Normal range of motion.  Neurological:     Mental Status: He is alert and oriented to person, place, and time.    Review of Systems  Constitutional: Negative.   HENT: Negative.   Eyes: Negative.   Respiratory: Negative.   Cardiovascular: Negative.   Gastrointestinal: Negative.   Genitourinary: Negative.   Musculoskeletal: Negative.   Skin: Negative.   Neurological: Negative.   Endo/Heme/Allergies: Negative.   Psychiatric/Behavioral: Positive for substance abuse.   Blood pressure 122/78, pulse 64, temperature 98.1 F (36.7 C), temperature source Oral, resp. rate 18, SpO2 98 %. There is no height or weight on file to calculate BMI.  Demographic Factors:  Male and Low socioeconomic status  Loss Factors: NA  Historical Factors: NA  Risk Reduction Factors:   Positive social support and Positive therapeutic relationship  Continued Clinical Symptoms:  Alcohol/Substance Abuse/Dependencies Previous Psychiatric Diagnoses and Treatments  Cognitive Features That Contribute To Risk:  None    Suicide Risk:  Minimal: No identifiable suicidal ideation.  Patients presenting with no risk factors but with morbid ruminations; may be classified as minimal risk based on the severity of the depressive symptoms  Plan Of Care/Follow-up recommendations:  Continue activity as tolerated. Continue diet as recommended by your PCP. Ensure to keep all appointments with outpatient providers.  Disposition: Discharge to [place], [place] 01/01/2021, 12:17 PM

## 2021-01-01 NOTE — ED Notes (Signed)
Pt sleeping'@this'$  time. breathing even and unlabored. Will continue to monitor for safety

## 2021-01-01 NOTE — ED Notes (Signed)
Breakfast giving.

## 2021-01-01 NOTE — ED Notes (Signed)
Pt educated on avs and medications. Verbalized understanding. Escorted to back to retrieve belongings. Ambulated per self. No new issues noted. No s/s pain, discomfort, or acute distress. No SI, HI, or AVH noted. A&O x4. Escorted to back [place] to safe transport for transportation. Stable at time of d/c

## 2021-01-03 ENCOUNTER — Ambulatory Visit: Payer: Medicare Other | Admitting: Orthopedic Surgery

## 2021-01-05 DIAGNOSIS — F1020 Alcohol dependence, uncomplicated: Secondary | ICD-10-CM | POA: Insufficient documentation

## 2021-01-05 DIAGNOSIS — F32A Depression, unspecified: Secondary | ICD-10-CM | POA: Insufficient documentation

## 2021-01-05 DIAGNOSIS — F1420 Cocaine dependence, uncomplicated: Secondary | ICD-10-CM | POA: Insufficient documentation

## 2021-01-19 DIAGNOSIS — F1994 Other psychoactive substance use, unspecified with psychoactive substance-induced mood disorder: Secondary | ICD-10-CM | POA: Insufficient documentation

## 2021-01-25 ENCOUNTER — Ambulatory Visit (HOSPITAL_COMMUNITY): Payer: Medicare Other | Attending: Orthopedic Surgery

## 2021-01-29 ENCOUNTER — Ambulatory Visit (HOSPITAL_COMMUNITY): Admission: RE | Admit: 2021-01-29 | Payer: Medicare Other | Source: Ambulatory Visit

## 2021-01-29 ENCOUNTER — Ambulatory Visit (HOSPITAL_COMMUNITY)
Admission: RE | Admit: 2021-01-29 | Discharge: 2021-01-29 | Disposition: A | Discharge status: Home / Self Care | Payer: Medicare Other | Source: Ambulatory Visit | Attending: Orthopedic Surgery | Admitting: Orthopedic Surgery

## 2021-01-29 ENCOUNTER — Ambulatory Visit (INDEPENDENT_AMBULATORY_CARE_PROVIDER_SITE_OTHER): Payer: Medicare Other | Admitting: Orthopedic Surgery

## 2021-01-29 ENCOUNTER — Other Ambulatory Visit: Payer: Self-pay

## 2021-01-29 ENCOUNTER — Ambulatory Visit: Payer: Medicare Other

## 2021-01-29 ENCOUNTER — Other Ambulatory Visit: Payer: Self-pay | Admitting: Orthopedic Surgery

## 2021-01-29 VITALS — BP 168/101 | HR 90 | Ht 70.0 in | Wt 185.0 lb

## 2021-01-29 DIAGNOSIS — M25511 Pain in right shoulder: Secondary | ICD-10-CM | POA: Insufficient documentation

## 2021-01-29 DIAGNOSIS — G8929 Other chronic pain: Secondary | ICD-10-CM

## 2021-01-29 DIAGNOSIS — M75121 Complete rotator cuff tear or rupture of right shoulder, not specified as traumatic: Secondary | ICD-10-CM | POA: Diagnosis not present

## 2021-01-29 DIAGNOSIS — M25561 Pain in right knee: Secondary | ICD-10-CM | POA: Diagnosis not present

## 2021-01-29 NOTE — Progress Notes (Signed)
  Chief Complaint  Patient presents with  . Knee Pain    Right/ fell   . Shoulder Pain    Review MRI scan     Encounter Diagnoses  Name Primary?  . Chronic pain of right knee Yes  . Acute pain of right knee   . Nontraumatic complete tear of right rotator cuff     #1 patient fell on right knee patient ha has chronic right knee pain status post arthroscopy complicated by infection status post irrigation and debridement with residual pain  #2  Incomplete tear right shoulder MRI positive for partial to complete tear right shoulder.  Right knee has a small effusion he has tenderness diffusely about the knee  X-ray was negative  I aspirated 5 cc to 7 cc of clear fluid and injected knee with steroid  I reviewed his MRI after looking at it and he does have a near complete right rotator cuff tear which will require surgery based on the patient's weak abduction and weak flexion  Procedure note injection and aspiration right knee joint  Verbal consent was obtained to aspirate and inject the right knee joint   Timeout was completed to confirm the site of aspiration and injection  An 18-gauge needle was used to aspirate the knee joint from a suprapatellar lateral approach.  The medications used were 40 mg of Depo-Medrol and 1% lidocaine 3 cc  Anesthesia was provided by ethyl chloride and the skin was prepped with alcohol.  After cleaning the skin with alcohol an 18-gauge needle was used to aspirate the right knee joint.  We obtained 10  cc of fluid clear yellow  We follow this by injection of 40 mg of Depo-Medrol and 3 cc 1% lidocaine.  There were no complications. A sterile bandage was applied.  Meds ordered this encounter  Medications  . oxycodone (OXY-IR) 5 MG capsule    Sig: Take 1 capsule (5 mg total) by mouth every 6 (six) hours as needed for up to 7 days.    Dispense:  28 capsule    Refill:  0

## 2021-01-29 NOTE — Patient Instructions (Signed)
You have received an injection of steroids into the joint. 15% of patients will have increased pain within the 24 hours postinjection.   This is transient and will go away.   We recommend that you use ice packs on the injection site for 20 minutes every 2 hours and extra strength Tylenol 2 tablets every 8 as needed until the pain resolves.  If you continue to have pain after taking the Tylenol and using the ice please call the office for further instructions.  Patient will have surgery on his right shoulder when he calls [place] back to give [place] a date

## 2021-01-29 NOTE — Telephone Encounter (Signed)
Rx request 

## 2021-01-30 MED ORDER — OXYCODONE HCL 5 MG PO CAPS: 5.0000 mg | ORAL_CAPSULE | Freq: Four times a day (QID) | ORAL | 0 refills | Status: DC | PRN

## 2021-01-30 NOTE — Addendum Note (Signed)
Addended by: Carole Civil on: 01/30/2021 08:50 AM   Modules accepted: Orders

## 2021-02-06 ENCOUNTER — Other Ambulatory Visit: Payer: Self-pay | Admitting: Orthopedic Surgery

## 2021-02-06 ENCOUNTER — Other Ambulatory Visit: Payer: Self-pay

## 2021-02-06 ENCOUNTER — Emergency Department (HOSPITAL_COMMUNITY)
Admission: EM | Admit: 2021-02-06 | Discharge: 2021-02-06 | Disposition: A | Discharge status: Home / Self Care | Payer: Medicare Other | Attending: Emergency Medicine | Admitting: Emergency Medicine

## 2021-02-06 ENCOUNTER — Emergency Department (HOSPITAL_COMMUNITY): Payer: Medicare Other

## 2021-02-06 ENCOUNTER — Encounter (HOSPITAL_COMMUNITY): Payer: Self-pay | Admitting: *Deleted

## 2021-02-06 DIAGNOSIS — I10 Essential (primary) hypertension: Secondary | ICD-10-CM | POA: Diagnosis not present

## 2021-02-06 DIAGNOSIS — Z20822 Contact with and (suspected) exposure to covid-19: Secondary | ICD-10-CM | POA: Diagnosis not present

## 2021-02-06 DIAGNOSIS — Z79899 Other long term (current) drug therapy: Secondary | ICD-10-CM | POA: Diagnosis not present

## 2021-02-06 DIAGNOSIS — K2970 Gastritis, unspecified, without bleeding: Secondary | ICD-10-CM

## 2021-02-06 DIAGNOSIS — K529 Noninfective gastroenteritis and colitis, unspecified: Secondary | ICD-10-CM | POA: Diagnosis not present

## 2021-02-06 DIAGNOSIS — E1140 Type 2 diabetes mellitus with diabetic neuropathy, unspecified: Secondary | ICD-10-CM | POA: Insufficient documentation

## 2021-02-06 DIAGNOSIS — Z96652 Presence of left artificial knee joint: Secondary | ICD-10-CM | POA: Insufficient documentation

## 2021-02-06 DIAGNOSIS — K2990 Gastroduodenitis, unspecified, without bleeding: Secondary | ICD-10-CM | POA: Diagnosis not present

## 2021-02-06 DIAGNOSIS — M25561 Pain in right knee: Secondary | ICD-10-CM

## 2021-02-06 DIAGNOSIS — G8929 Other chronic pain: Secondary | ICD-10-CM

## 2021-02-06 DIAGNOSIS — Z7984 Long term (current) use of oral hypoglycemic drugs: Secondary | ICD-10-CM | POA: Diagnosis not present

## 2021-02-06 DIAGNOSIS — F17210 Nicotine dependence, cigarettes, uncomplicated: Secondary | ICD-10-CM | POA: Insufficient documentation

## 2021-02-06 DIAGNOSIS — R109 Unspecified abdominal pain: Secondary | ICD-10-CM | POA: Diagnosis present

## 2021-02-06 LAB — URINALYSIS, ROUTINE W REFLEX MICROSCOPIC
Bacteria, UA: NONE SEEN
Bilirubin Urine: NEGATIVE
Glucose, UA: NEGATIVE mg/dL
Ketones, ur: NEGATIVE mg/dL
Leukocytes,Ua: NEGATIVE
Nitrite: NEGATIVE
Protein, ur: NEGATIVE mg/dL
Specific Gravity, Urine: 1.005 (ref 1.005–1.030)
pH: 6 (ref 5.0–8.0)

## 2021-02-06 LAB — CBC
HCT: 47.2 % (ref 39.0–52.0)
Hemoglobin: 15.5 g/dL (ref 13.0–17.0)
MCH: 30 pg (ref 26.0–34.0)
MCHC: 32.8 g/dL (ref 30.0–36.0)
MCV: 91.5 fL (ref 80.0–100.0)
Platelets: 413 10*3/uL — ABNORMAL HIGH (ref 150–400)
RBC: 5.16 MIL/uL (ref 4.22–5.81)
RDW: 16.1 % — ABNORMAL HIGH (ref 11.5–15.5)
WBC: 6.6 10*3/uL (ref 4.0–10.5)
nRBC: 0 % (ref 0.0–0.2)

## 2021-02-06 LAB — RESP PANEL BY RT-PCR (FLU A&B, COVID) ARPGX2
Influenza A by PCR: NEGATIVE
Influenza B by PCR: NEGATIVE
SARS Coronavirus 2 by RT PCR: NEGATIVE

## 2021-02-06 LAB — LIPASE, BLOOD: Lipase: 26 U/L (ref 11–51)

## 2021-02-06 LAB — COMPREHENSIVE METABOLIC PANEL WITH GFR
ALT: 23 U/L (ref 0–44)
AST: 42 U/L — ABNORMAL HIGH (ref 15–41)
Albumin: 4.2 g/dL (ref 3.5–5.0)
Alkaline Phosphatase: 43 U/L (ref 38–126)
Anion gap: 9 (ref 5–15)
BUN: 16 mg/dL (ref 6–20)
CO2: 25 mmol/L (ref 22–32)
Calcium: 9 mg/dL (ref 8.9–10.3)
Chloride: 100 mmol/L (ref 98–111)
Creatinine, Ser: 0.98 mg/dL (ref 0.61–1.24)
GFR, Estimated: >60 mL/min (ref >60)
Glucose, Bld: 87 mg/dL (ref 70–99)
Potassium: 3.6 mmol/L (ref 3.5–5.1)
Sodium: 134 mmol/L — ABNORMAL LOW (ref 135–145)
Total Bilirubin: 1 mg/dL (ref 0.3–1.2)
Total Protein: 7.9 g/dL (ref 6.5–8.1)

## 2021-02-06 MED ORDER — SUCRALFATE 1 G PO TABS: 1.0000 g | ORAL_TABLET | Freq: Three times a day (TID) | ORAL | 1 refills | Status: DC

## 2021-02-06 MED ORDER — ALUM & MAG HYDROXIDE-SIMETH 200-200-20 MG/5ML PO SUSP
30.0000 mL | Freq: Once | ORAL | Status: AC
  Administered 2021-02-06: 30 mL via ORAL
  Filled 2021-02-06: qty 30

## 2021-02-06 MED ORDER — SODIUM CHLORIDE 0.9 % IV BOLUS
1000.0000 mL | Freq: Once | INTRAVENOUS | Status: AC
  Administered 2021-02-06: 1000 mL via INTRAVENOUS

## 2021-02-06 MED ORDER — IOHEXOL 300 MG/ML  SOLN
100.0000 mL | Freq: Once | INTRAMUSCULAR | Status: AC | PRN
  Administered 2021-02-06: 100 mL via INTRAVENOUS

## 2021-02-06 MED ORDER — SUCRALFATE 1 GM/10ML PO SUSP
1.0000 g | Freq: Three times a day (TID) | ORAL | Status: DC
  Administered 2021-02-06: 1 g via ORAL
  Filled 2021-02-06: qty 10

## 2021-02-06 NOTE — ED Triage Notes (Signed)
Abdominal pain x 1 week 

## 2021-02-06 NOTE — ED Provider Notes (Signed)
[facility] EMERGENCY DEPARTMENT Provider Note   CSN: [id number] Arrival date & time: 02/06/21  1237     History Chief Complaint  Patient presents with  . Abdominal Pain    [name] [name]. is a 60 y.o. male with past medical history of acid reflux, alcohol abuse, arthritis, bipolar disorder, hyperlipidemia, hypertension, diabetes, pancreatitis, pseudocyst of the pancreas that presents to the emergency department today for abdominal pain.  Patient states that he is had abdominal pain for the past week.  Patient states that chest pain is mainly in his epigastric region, however radiates throughout his abdomen into his mid back.  Also states that he has had diarrhea for the past week, states that everything he eats goes right through him.  Denies any nausea or vomiting.  Patient states that he drinks about 6 beers daily with some liqouir mixed in between, last drink was this morning.  States that the drinks upset his stomach, however he pushes through.  Also states that he has been using cocaine recently, last use was yesterday.  Denies any IV drug use.  Denies any fevers or chills.  Also admits to some cough and congestion.  Denies any sick contacts.  Patient states that he currently lives with his cousin who denies any similar symptoms.  Denies any chest pain or shortness of breath.  Denies any trauma to any of these areas.  States that he has not been using them for this.  States that pain feels slightly similar to when he had his pancreatitis.  Denies any regurgitation.  No urinary or pelvic complaints.  No blood in his stool.  No other complaints at this time.  Per chart review patient has been seen here 9 times in the last month for complaints with homelessness, suicidal ideations, mood disorder, cocaine abuse.  HPI     Past Medical History:  Diagnosis Date  . Acid reflux   . Alcohol abuse    6-8 cans of beer daily; hx of incarceration for DWI  . Arthritis   . Bipolar disorder ([place])    . Bronchitis   . Chronic back pain   . Chronic neck pain   . Chronic pain   . Diabetes mellitus without complication ([place])   . Fracture of lower leg   . Gout   . Hyperlipidemia   . Hypertension   . Left arm pain    chronic  . Migraine   . Pancreatitis    March 2013  . Pseudocyst of pancreas 02/28/2012  . Substance abuse [facility])     Patient Active Problem List   Diagnosis Date Noted  . Type 2 diabetes mellitus with diabetic neuropathy, without long-term current use of insulin ([place]) 09/21/2020  . Septic arthritis ([place]) 08/09/2019  . S/P right knee arthroscopy 07/29/2019 08/03/2019  . Acute lateral meniscus tear of right knee   . Acute medial meniscus tear of right knee   . Substance use disorder 03/16/2019  . History of diabetes mellitus 10/10/2016  . Avascular necrosis of hip, left ([place]) 10/04/2016  . Status post left hip replacement 10/04/2016  . Rash and nonspecific skin eruption 08/12/2013  . Cholelithiases 08/04/2013  . Subcutaneous nodule 07/21/2013  . Acute alcoholic pancreatitis 24/23/5361  . Syncope 04/11/2013  . Prolonged Q-T interval on ECG 04/11/2013  . Cocaine abuse ([place]) 03/13/2013    Class: Chronic  . At risk for adverse drug event 02/14/2013  . DDD (degenerative disc disease), lumbar 02/01/2013  . Dyspepsia 12/01/2012  .  BPH (benign prostatic hyperplasia) 10/07/2012  . Allergic rhinitis 10/03/2012  . Transaminitis 10/02/2012  . Chronic pain syndrome 10/02/2012  . Essential hypertension, benign 07/01/2012  . Tobacco user 07/01/2012  . Hepatic steatosis 03/06/2012  . ED (erectile dysfunction) 03/06/2012  . Pseudocyst of pancreas 02/28/2012  . Alcohol abuse 02/25/2012  . GERD (gastroesophageal reflux disease) 02/23/2012  . Bipolar 1 disorder ([place]) 02/23/2012  . Hypertriglyceridemia 12/05/2011    Past Surgical History:  Procedure Laterality Date  . CIRCUMCISION  03/17/2012   Procedure: CIRCUMCISION ADULT;  Surgeon: Marissa Nestle, MD;  Location: [facility]  ORS;  Service: Urology;  Laterality: N/A;  . COLONOSCOPY WITH PROPOFOL  12/24/2012   FFM:BWGY lesion as described above status post biopsy; otherwise normal rectum/Sigmoid diverticulosis/Sigmoid polyps -- resected as described above. anal lesion, benign. colon polyp, hyperplastic  . ESOPHAGOGASTRODUODENOSCOPY (EGD) WITH PROPOFOL  12/24/2012   KZL:[id number] erythema and erosions of uncertain significance status post gastric biopsy (reactive gastropathy NO h.pylori)  . KNEE ARTHROSCOPY Right 08/09/2019   Procedure: arthroscopic incision and drainage snovectomy;  Surgeon: Paralee Cancel, MD;  Location: [facility] ORS;  Service: Orthopedics;  Laterality: Right;  . KNEE ARTHROSCOPY WITH MEDIAL MENISECTOMY Right 07/29/2019   Procedure: RIGHT KNEE ARTHROSCOPY WITH MEDIAL MENISCECTOMY AND LATERAL MENISCECTOMY;  Surgeon: Carole Civil, MD;  Location: [facility] ORS;  Service: Orthopedics;  Laterality: Right;  . NOSE SURGERY     broken nose  . TOTAL HIP ARTHROPLASTY Left 10/04/2016   Procedure: LEFT TOTAL HIP ARTHROPLASTY ANTERIOR APPROACH;  Surgeon: Mcarthur Rossetti, MD;  Location: [facility] ORS;  Service: Orthopedics;  Laterality: Left;       Family History  Problem Relation Age of Onset  . Diabetes Father   . Healthy Mother   . Anesthesia problems Neg Hx   . Hypotension Neg Hx   . Malignant hyperthermia Neg Hx   . Pseudochol deficiency Neg Hx   . Colon cancer Neg Hx   . Heart disease Neg Hx   . Stroke Neg Hx   . Cancer Neg Hx     Social History   Tobacco Use  . Smoking status: Current Every Day Smoker    Packs/day: 0.50    Years: 25.00    Pack years: 12.50    Types: Cigarettes  . Smokeless tobacco: Former Systems developer    Types: Chew    Quit date: 12/23/2014  Vaping Use  . Vaping Use: Never used  Substance Use Topics  . Alcohol use: Yes    Comment: heavily  . Drug use: Yes    Types: Cocaine, Marijuana    Home Medications Prior to Admission medications   Medication Sig Start Date End Date Taking?  Authorizing Provider  sucralfate (CARAFATE) 1 g tablet Take 1 tablet (1 g total) by mouth 4 (four) times daily -  with meals and at bedtime. 02/06/21  Yes Zharia Conrow, PA-C  amLODipine (NORVASC) 5 MG tablet Take 1 tablet (5 mg total) by mouth daily. 01/01/21   Money, Lowry Ram, FNP  citalopram (CELEXA) 20 MG tablet Take 1 tablet (20 mg total) by mouth daily. 01/01/21   Money, Lowry Ram, FNP  gabapentin (NEURONTIN) 300 MG capsule Take 1 capsule (300 mg total) by mouth 3 (three) times daily. 01/01/21   Money, Lowry Ram, FNP  lisinopril (ZESTRIL) 10 MG tablet Take 1 tablet (10 mg total) by mouth daily. 01/01/21   Money, Lowry Ram, FNP  metFORMIN (GLUCOPHAGE) 850 MG tablet Take 1 tablet (850 mg total) by mouth daily with breakfast.  01/01/21   Money, Lowry Ram, FNP  oxyCODONE (OXY IR/ROXICODONE) 5 MG immediate release tablet TAKE ONE TABLET EVERY 6 HOURS AS NEEDED FOR 7 DAYS 02/06/21   Carole Civil, MD  pantoprazole (PROTONIX) 40 MG tablet Take 1 tablet (40 mg total) by mouth daily. 01/02/21   Money, Lowry Ram, FNP  tamsulosin (FLOMAX) 0.4 MG CAPS capsule Take 1 capsule (0.4 mg total) by mouth daily. 01/01/21   Money, Lowry Ram, FNP  traZODone (DESYREL) 50 MG tablet Take 1 tablet (50 mg total) by mouth at bedtime as needed for sleep. 01/01/21   Money, Lowry Ram, FNP    Allergies    Bee venom and Penicillins  Review of Systems   Review of Systems  Constitutional: Negative for chills, diaphoresis, fatigue and fever.  HENT: Positive for congestion. Negative for sore throat and trouble swallowing.   Eyes: Negative for pain and visual disturbance.  Respiratory: Positive for cough. Negative for shortness of breath and wheezing.   Cardiovascular: Negative for chest pain, palpitations and leg swelling.  Gastrointestinal: Positive for abdominal pain and diarrhea. Negative for abdominal distention, nausea and vomiting.  Genitourinary: Negative for difficulty urinating.  Musculoskeletal: Negative for back pain, neck  pain and neck stiffness.  Skin: Negative for pallor.  Neurological: Negative for dizziness, speech difficulty, weakness and headaches.  Psychiatric/Behavioral: Negative for confusion.    Physical Exam Updated Vital Signs BP (!) 135/99   Pulse 71   Temp 98 F (36.7 C) (Oral)   Resp 16   SpO2 99%   Physical Exam Constitutional:      General: He is not in acute distress.    Appearance: Normal appearance. He is not ill-appearing, toxic-appearing or diaphoretic.  HENT:     Mouth/Throat:     Mouth: Mucous membranes are moist.     Pharynx: Oropharynx is clear.  Eyes:     General: No scleral icterus.    Extraocular Movements: Extraocular movements intact.     Pupils: Pupils are equal, round, and reactive to light.  Cardiovascular:     Rate and Rhythm: Normal rate and regular rhythm.     Pulses: Normal pulses.     Heart sounds: Normal heart sounds.  Pulmonary:     Effort: Pulmonary effort is normal. No respiratory distress.     Breath sounds: Normal breath sounds. No stridor. No wheezing, rhonchi or rales.  Chest:     Chest wall: No tenderness.  Abdominal:     General: Abdomen is flat. There is no distension.     Palpations: Abdomen is soft.     Tenderness: There is abdominal tenderness in the epigastric area. There is no guarding or rebound.  Musculoskeletal:        General: No swelling or tenderness. Normal range of motion.     Cervical back: Normal range of motion and neck supple. No rigidity.     Right lower leg: No edema.     Left lower leg: No edema.  Skin:    General: Skin is warm and dry.     Capillary Refill: Capillary refill takes less than 2 seconds.     Coloration: Skin is not pale.  Neurological:     General: No focal deficit present.     Mental Status: He is alert and oriented to person, place, and time.  Psychiatric:        Mood and Affect: Mood normal.        Behavior: Behavior normal.     ED Results /  Procedures / Treatments   Labs (all labs ordered  are listed, but only abnormal results are displayed) Labs Reviewed  COMPREHENSIVE METABOLIC PANEL - Abnormal; Notable for the following components:      Result Value   Sodium 134 (*)    AST 42 (*)    All other components within normal limits  CBC - Abnormal; Notable for the following components:   RDW 16.1 (*)    Platelets 413 (*)    All other components within normal limits  URINALYSIS, ROUTINE W REFLEX MICROSCOPIC - Abnormal; Notable for the following components:   Color, Urine STRAW (*)    Hgb urine dipstick SMALL (*)    All other components within normal limits  RESP PANEL BY RT-PCR (FLU A&B, COVID) ARPGX2  LIPASE, BLOOD    EKG None  Radiology CT Abdomen Pelvis W Contrast  Result Date: 02/06/2021 CLINICAL DATA:  Abdominal pain and diarrhea for 1 week. History of pancreatitis. EXAM: CT ABDOMEN AND PELVIS WITH CONTRAST TECHNIQUE: Multidetector CT imaging of the abdomen and pelvis was performed using the standard protocol following bolus administration of intravenous contrast. CONTRAST:  152mL OMNIPAQUE IOHEXOL 300 MG/ML  SOLN COMPARISON:  08/06/2013 FINDINGS: Lower chest: Dependent subsegmental atelectasis. Included lung bases are otherwise clear. Heart size within normal limits. Hepatobiliary: Mildly decreased attenuation of the hepatic parenchyma suggesting hepatic steatosis. No focal liver lesion is identified. Multiple small stones layer dependently within the gallbladder lumen. No pericholecystic inflammatory changes by CT. No biliary dilatation. Pancreas: Unremarkable. No pancreatic ductal dilatation or surrounding inflammatory changes. Previously seen pseudocysts adjacent to the pancreatic tail have resolved. Spleen: Normal in size without focal abnormality. Adrenals/Urinary Tract: Unremarkable adrenal glands. Kidneys enhance symmetrically without focal lesion, stone, or hydronephrosis. Ureters are nondilated. Urinary bladder appears unremarkable. Stomach/Bowel: Prominent asymmetric  wall thickening along the greater curvature stomach (series 2, image 21). Mild circumferential wall thickening of the duodenum. Fluid-filled small bowel throughout the abdomen with air-fluid levels. No dilated loops of bowel to suggest obstruction. Liquid stool within the colon, predominantly right-sided. Multiple scattered colonic diverticula. No focal colonic thickening or inflammatory changes. A normal appendix is present in the right lower quadrant. Vascular/Lymphatic: Scattered aortoiliac atherosclerotic calcifications without aneurysm. No abdominopelvic lymphadenopathy. Reproductive: Prostate is unremarkable. Other: No free fluid. No abdominopelvic fluid collection. No pneumoperitoneum. Tiny fat containing umbilical hernia. Musculoskeletal: Left total hip arthroplasty. No acute osseous findings. IMPRESSION: 1. Prominent asymmetric wall thickening along the greater curvature of the stomach. Mild circumferential wall thickening of the duodenum. Findings may represent gastritis/duodenitis. Consider follow-up endoscopy. 2. Fluid-filled small bowel throughout the abdomen with air-fluid levels. Liquid stool within the colon, predominantly right-sided. Findings are suggestive of enteritis/diarrheal illness. 3. Cholelithiasis without evidence of acute cholecystitis. 4. Colonic diverticulosis. 5. Hepatic steatosis. 6. Aortic atherosclerosis (ICD10-I70.0). Electronically Signed   By: Davina Poke D.O.   On: 02/06/2021 16:25    Procedures Procedures   Medications Ordered in ED Medications  sucralfate (CARAFATE) 1 GM/10ML suspension 1 g (has no administration in time range)  sodium chloride 0.9 % bolus 1,000 mL (0 mLs Intravenous Stopped 02/06/21 1535)  alum & mag hydroxide-simeth (MAALOX/MYLANTA) 200-200-20 MG/5ML suspension 30 mL (30 mLs Oral Given 02/06/21 1443)  iohexol (OMNIPAQUE) 300 MG/ML solution 100 mL (100 mLs Intravenous Contrast Given 02/06/21 1555)    ED Course  I have reviewed the triage  vital signs and the nursing notes.  Pertinent labs & imaging results that were available during my care of the patient were reviewed by me and  considered in my medical decision making (see chart for details).    MDM Rules/Calculators/A&P                          Khristopher Kapaun. is a 60 y.o. male with past medical history of acid reflux, alcohol abuse arthritis, bipolar disorder, hyperlipidemia, hypertension, diabetes, pancreatitis, pseudocyst of the pancreas that presents to the emergency department today for abdominal pain.  Patient with epigastric pain with diarrhea and some URI symptoms, differential to include COVID, pancreatitis, acid reflux, viral syndrome, gastritis, colitis.  Will obtain basic labs and reevaluate.  Initial interventions GI cocktail and IV fluids. No nausea/vomiting. Slightly hypertensive, otherwise reasuring vitals.   Labs demonstrated unremarkable CBC, CMP urinalysis, negative Covid test.  CT abdomen does show gastritis/duodenitis, this is most likely from chronic alcohol abuse.  Did discuss this with patient.  Will provide resources to patient.  We will also have patient follow-up with GI for upper endoscopy, patient agreeable.  Did place patient on Carafate, patient with prolonged QTC, unable to prescribe Protonix at this time, patient will follow up with PCP about this.  Symptomatic treatment discussed in regards to diarrhea, patient looks like he has enteritis.  No concerns for colitis.  Patient currently eating, appears well on reassessment.  Patient to be discharged at this time.  Given the above findings, my suspicion is that patient symptoms of gastritis due to chronic alcohol use.  Labs are reassuring.  Patient be discharged.  Doubt need for further emergent work up at this time. I explained the diagnosis and have given explicit precautions to return to the ER including for any other new or worsening symptoms. The patient understands and accepts the medical plan as  it's been dictated and I have answered their questions. Discharge instructions concerning home care and prescriptions have been given. The patient is STABLE and is discharged to home in good condition.  Final Clinical Impression(s) / ED Diagnoses Final diagnoses:  Gastritis and gastroduodenitis  Enteritis    Rx / DC Orders ED Discharge Orders         Ordered    sucralfate (CARAFATE) 1 g tablet  3 times daily with meals & bedtime        02/06/21 1736           Alfredia Client, PA-C 02/06/21 1806    Truddie Hidden, MD 02/07/21 0700

## 2021-02-06 NOTE — Telephone Encounter (Signed)
Rx request 

## 2021-02-06 NOTE — Discharge Instructions (Addendum)
You are seen today for abdominal pain, as we discussed your work-up does show inflammation of your stomach and area surrounding this.  This is most likely from excessive drinking as we discussed, you can use the attached instructions on gastritis.  I want you to take the medication as prescribed and follow-up with the GI doctor, as we discussed you most likely need an endoscopy.  Please schedule appointment with them.  If you have any new or worsening concerning symptoms like back to the emergency department.  I attached your CT below.  Please try to avoid alcohol as these are most likely causing your symptoms.  Make sure to stay hydrated. IMPRESSION:  1. Prominent asymmetric wall thickening along the greater curvature  of the stomach. Mild circumferential wall thickening of the  duodenum. Findings may represent gastritis/duodenitis. Consider  follow-up endoscopy.  2. Fluid-filled small bowel throughout the abdomen with air-fluid  levels. Liquid stool within the colon, predominantly right-sided.  Findings are suggestive of enteritis/diarrheal illness.  3. Cholelithiasis without evidence of acute cholecystitis.  4. Colonic diverticulosis.  5. Hepatic steatosis.  6. Aortic atherosclerosis (ICD10-I70.0).   Get help right away if: You vomit blood or material that looks like coffee grounds. You have black or dark red stools. You are unable to keep fluids down. Your abdominal pain gets worse. You have a fever. You do not feel better after one week.

## 2021-02-13 ENCOUNTER — Other Ambulatory Visit: Payer: Self-pay | Admitting: Orthopedic Surgery

## 2021-02-13 DIAGNOSIS — G8929 Other chronic pain: Secondary | ICD-10-CM

## 2021-02-20 ENCOUNTER — Other Ambulatory Visit: Payer: Self-pay | Admitting: Orthopedic Surgery

## 2021-02-20 DIAGNOSIS — M25561 Pain in right knee: Secondary | ICD-10-CM

## 2021-02-20 DIAGNOSIS — G8929 Other chronic pain: Secondary | ICD-10-CM

## 2021-02-23 NOTE — Telephone Encounter (Signed)
Patient said he called on Wednesday to request refill for pain medicine he has a broken bone and needs his pain medicine    oxyCODONE (OXY IR/ROXICODONE) 5 MG immediate release tablet    Pharm[facility]:  [facility] same [facility] he has always uses    Please call him at [phone]

## 2021-02-28 ENCOUNTER — Other Ambulatory Visit: Payer: Self-pay | Admitting: Orthopedic Surgery

## 2021-02-28 DIAGNOSIS — M25561 Pain in right knee: Secondary | ICD-10-CM

## 2021-02-28 DIAGNOSIS — G8929 Other chronic pain: Secondary | ICD-10-CM

## 2021-03-01 ENCOUNTER — Encounter (HOSPITAL_COMMUNITY): Payer: Self-pay | Admitting: *Deleted

## 2021-03-01 ENCOUNTER — Emergency Department (HOSPITAL_COMMUNITY)
Admission: EM | Admit: 2021-03-01 | Discharge: 2021-03-01 | Disposition: A | Discharge status: Home / Self Care | Payer: Medicare Other | Attending: Emergency Medicine | Admitting: Emergency Medicine

## 2021-03-01 ENCOUNTER — Other Ambulatory Visit: Payer: Self-pay

## 2021-03-01 ENCOUNTER — Emergency Department (HOSPITAL_COMMUNITY): Payer: Medicare Other

## 2021-03-01 DIAGNOSIS — Z96642 Presence of left artificial hip joint: Secondary | ICD-10-CM | POA: Insufficient documentation

## 2021-03-01 DIAGNOSIS — I10 Essential (primary) hypertension: Secondary | ICD-10-CM | POA: Diagnosis not present

## 2021-03-01 DIAGNOSIS — Z7984 Long term (current) use of oral hypoglycemic drugs: Secondary | ICD-10-CM | POA: Diagnosis not present

## 2021-03-01 DIAGNOSIS — R1084 Generalized abdominal pain: Secondary | ICD-10-CM | POA: Diagnosis present

## 2021-03-01 DIAGNOSIS — F17210 Nicotine dependence, cigarettes, uncomplicated: Secondary | ICD-10-CM | POA: Insufficient documentation

## 2021-03-01 DIAGNOSIS — Z79899 Other long term (current) drug therapy: Secondary | ICD-10-CM | POA: Insufficient documentation

## 2021-03-01 DIAGNOSIS — E1140 Type 2 diabetes mellitus with diabetic neuropathy, unspecified: Secondary | ICD-10-CM | POA: Diagnosis not present

## 2021-03-01 DIAGNOSIS — A084 Viral intestinal infection, unspecified: Secondary | ICD-10-CM | POA: Insufficient documentation

## 2021-03-01 LAB — COMPREHENSIVE METABOLIC PANEL
ALT: 22 U/L (ref 0–44)
AST: 73 U/L — ABNORMAL HIGH (ref 15–41)
Albumin: 4 g/dL (ref 3.5–5.0)
Alkaline Phosphatase: 37 U/L — ABNORMAL LOW (ref 38–126)
Anion gap: 11 (ref 5–15)
BUN: 13 mg/dL (ref 6–20)
CO2: 27 mmol/L (ref 22–32)
Calcium: 8.9 mg/dL (ref 8.9–10.3)
Chloride: 96 mmol/L — ABNORMAL LOW (ref 98–111)
Creatinine, Ser: 0.9 mg/dL (ref 0.61–1.24)
GFR, Estimated: >60 mL/min (ref >60)
Glucose, Bld: 90 mg/dL (ref 70–99)
Potassium: 3.8 mmol/L (ref 3.5–5.1)
Sodium: 134 mmol/L — ABNORMAL LOW (ref 135–145)
Total Bilirubin: 0.6 mg/dL (ref 0.3–1.2)
Total Protein: 7.9 g/dL (ref 6.5–8.1)

## 2021-03-01 LAB — CBC
HCT: 46.4 % (ref 39.0–52.0)
Hemoglobin: 15.4 g/dL (ref 13.0–17.0)
MCH: 30.4 pg (ref 26.0–34.0)
MCHC: 33.2 g/dL (ref 30.0–36.0)
MCV: 91.7 fL (ref 80.0–100.0)
Platelets: 357 10*3/uL (ref 150–400)
RBC: 5.06 MIL/uL (ref 4.22–5.81)
RDW: 16.9 % — ABNORMAL HIGH (ref 11.5–15.5)
WBC: 3.8 10*3/uL — ABNORMAL LOW (ref 4.0–10.5)
nRBC: 0 % (ref 0.0–0.2)

## 2021-03-01 LAB — URINALYSIS, ROUTINE W REFLEX MICROSCOPIC
Bilirubin Urine: NEGATIVE
Glucose, UA: NEGATIVE mg/dL
Hgb urine dipstick: NEGATIVE
Ketones, ur: 20 mg/dL — AB
Leukocytes,Ua: NEGATIVE
Nitrite: NEGATIVE
Protein, ur: NEGATIVE mg/dL
Specific Gravity, Urine: 1.025 (ref 1.005–1.030)
pH: 7 (ref 5.0–8.0)

## 2021-03-01 LAB — LIPASE, BLOOD: Lipase: 21 U/L (ref 11–51)

## 2021-03-01 MED ORDER — ACETAMINOPHEN 325 MG PO TABS
650.0000 mg | ORAL_TABLET | Freq: Once | ORAL | Status: AC
  Administered 2021-03-01: 650 mg via ORAL
  Filled 2021-03-01: qty 2

## 2021-03-01 MED ORDER — SODIUM CHLORIDE 0.9 % IV BOLUS
1000.0000 mL | Freq: Once | INTRAVENOUS | Status: AC
  Administered 2021-03-01: 1000 mL via INTRAVENOUS

## 2021-03-01 MED ORDER — IOHEXOL 300 MG/ML  SOLN
100.0000 mL | Freq: Once | INTRAMUSCULAR | Status: AC | PRN
  Administered 2021-03-01: 100 mL via INTRAVENOUS

## 2021-03-01 MED ORDER — LORAZEPAM 0.5 MG PO TABS
0.5000 mg | ORAL_TABLET | Freq: Once | ORAL | Status: AC
  Administered 2021-03-01: 0.5 mg via ORAL
  Filled 2021-03-01: qty 1

## 2021-03-01 MED ORDER — MORPHINE SULFATE (PF) 2 MG/ML IV SOLN
4.0000 mg | Freq: Once | INTRAVENOUS | Status: AC
  Administered 2021-03-01: 4 mg via INTRAVENOUS
  Filled 2021-03-01: qty 2

## 2021-03-01 NOTE — ED Provider Notes (Signed)
[facility] EMERGENCY DEPARTMENT Provider Note   CSN: [id number] Arrival date & time: 03/01/21  1101     History Chief Complaint  Patient presents with   Abdominal Pain    [name] [name]. is a 60 y.o. male with past medical history of alcohol abuse, about 8 cans of beer a day, acid reflux, bipolar disorder, chronic pain, diabetes, hyperlipidemia, hypertension, pancreatitis, pseudocyst of the pancreas that presents the emerge department today for abdominal pain, nausea and vomiting, diarrhea.  Patient states that the symptoms started yesterday.  Patient states that abdominal pain is generalized, does not rate anywhere..  States like he feels like a cramping sensation throughout his abdomen.  Patient states that he drinks about 8 cans of beers a day, states that he was unable to keep his beer down yesterday.  Denies any other substance abuse currently.  States that he had one episode of diarrhea today, nonbloody.  No bloody emesis either.  Denies any fevers or chills.  Denies any sick contacts.  Unsure if he ate anything that would have upset his stomach.  Denies any other complaints at this time.  No chest pain or shortness of breath.  No myalgias, cough or URI symptoms.  HPI     Past Medical History:  Diagnosis Date   Acid reflux    Alcohol abuse    6-8 cans of beer daily; hx of incarceration for DWI   Arthritis    Bipolar disorder (HCC)    Bronchitis    Chronic back pain    Chronic neck pain    Chronic pain    Diabetes mellitus without complication ([place])    Fracture of lower leg    Gout    Hyperlipidemia    Hypertension    Left arm pain    chronic   Migraine    Pancreatitis    March 2013   Pseudocyst of pancreas 02/28/2012   Substance abuse [facility])     Patient Active Problem List   Diagnosis Date Noted   Type 2 diabetes mellitus with diabetic neuropathy, without long-term current use of insulin ([place]) 09/21/2020   Septic arthritis ([place]) 08/09/2019    S/P right knee arthroscopy 07/29/2019 08/03/2019   Acute lateral meniscus tear of right knee    Acute medial meniscus tear of right knee    Substance use disorder 03/16/2019   History of diabetes mellitus 10/10/2016   Avascular necrosis of hip, left ([place]) 10/04/2016   Status post left hip replacement 10/04/2016   Rash and nonspecific skin eruption 08/12/2013   Cholelithiases 08/04/2013   Subcutaneous nodule 75/64/3329   Acute alcoholic pancreatitis 51/88/4166   Syncope 04/11/2013   Prolonged Q-T interval on ECG 04/11/2013   Cocaine abuse ([place]) 03/13/2013    Class: Chronic   At risk for adverse drug event 02/14/2013   DDD (degenerative disc disease), lumbar 02/01/2013   Dyspepsia 12/01/2012   BPH (benign prostatic hyperplasia) 10/07/2012   Allergic rhinitis 10/03/2012   Transaminitis 10/02/2012   Chronic pain syndrome 10/02/2012   Essential hypertension, benign 07/01/2012   Tobacco user 07/01/2012   Hepatic steatosis 03/06/2012   ED (erectile dysfunction) 03/06/2012   Pseudocyst of pancreas 02/28/2012   Alcohol abuse 02/25/2012   GERD (gastroesophageal reflux disease) 02/23/2012   Bipolar 1 disorder ([place]) 02/23/2012   Hypertriglyceridemia 12/05/2011    Past Surgical History:  Procedure Laterality Date   CIRCUMCISION  03/17/2012   Procedure: CIRCUMCISION ADULT;  Surgeon: Marissa Nestle, MD;  Location: [facility];  Service:  Urology;  Laterality: N/A;   COLONOSCOPY WITH PROPOFOL  12/24/2012   QAS:TMHD lesion as described above status post biopsy; otherwise normal rectum/Sigmoid diverticulosis/Sigmoid polyps -- resected as described above. anal lesion, benign. colon polyp, hyperplastic   ESOPHAGOGASTRODUODENOSCOPY (EGD) WITH PROPOFOL  12/24/2012   QQI:WLNLGXQ erythema and erosions of uncertain significance status post gastric biopsy (reactive gastropathy NO h.pylori)   KNEE ARTHROSCOPY Right 08/09/2019   Procedure: arthroscopic incision and drainage  snovectomy;  Surgeon: Paralee Cancel, MD;  Location: [facility];  Service: Orthopedics;  Laterality: Right;   KNEE ARTHROSCOPY WITH MEDIAL MENISECTOMY Right 07/29/2019   Procedure: RIGHT KNEE ARTHROSCOPY WITH MEDIAL MENISCECTOMY AND LATERAL MENISCECTOMY;  Surgeon: Carole Civil, MD;  Location: [facility];  Service: Orthopedics;  Laterality: Right;   NOSE SURGERY     broken nose   TOTAL HIP ARTHROPLASTY Left 10/04/2016   Procedure: LEFT TOTAL HIP ARTHROPLASTY ANTERIOR APPROACH;  Surgeon: Mcarthur Rossetti, MD;  Location: [facility];  Service: Orthopedics;  Laterality: Left;       Family History  Problem Relation Age of Onset   Diabetes Father    Healthy Mother    Anesthesia problems Neg Hx    Hypotension Neg Hx    Malignant hyperthermia Neg Hx    Pseudochol deficiency Neg Hx    Colon cancer Neg Hx    Heart disease Neg Hx    Stroke Neg Hx    Cancer Neg Hx     Social History   Tobacco Use   Smoking status: Current Every Day Smoker    Packs/day: 0.50    Years: 25.00    Pack years: 12.50    Types: Cigarettes   Smokeless tobacco: Former Systems developer    Types: Chew    Quit date: 12/23/2014  Vaping Use   Vaping Use: Never used  Substance Use Topics   Alcohol use: Yes    Comment: heavily   Drug use: Yes    Types: Cocaine, Marijuana    Home Medications Prior to Admission medications   Medication Sig Start Date End Date Taking? Authorizing Provider  amLODipine (NORVASC) 5 MG tablet Take 1 tablet (5 mg total) by mouth daily. 01/01/21   Money, Lowry Ram, FNP  citalopram (CELEXA) 20 MG tablet Take 1 tablet (20 mg total) by mouth daily. 01/01/21   Money, Lowry Ram, FNP  gabapentin (NEURONTIN) 300 MG capsule Take 1 capsule (300 mg total) by mouth 3 (three) times daily. 01/01/21   Money, Lowry Ram, FNP  lisinopril (ZESTRIL) 10 MG tablet Take 1 tablet (10 mg total) by mouth daily. 01/01/21   Money, Lowry Ram, FNP  metFORMIN (GLUCOPHAGE) 850 MG tablet Take 1 tablet (850 mg total) by  mouth daily with breakfast. 01/01/21   Money, Lowry Ram, FNP  oxyCODONE (OXY IR/ROXICODONE) 5 MG immediate release tablet TAKE ONE TABLET EVERY 6 HOURS AS NEEDED FOR 7 DAYS 02/23/21   Carole Civil, MD  pantoprazole (PROTONIX) 40 MG tablet Take 1 tablet (40 mg total) by mouth daily. 01/02/21   Money, Lowry Ram, FNP  sucralfate (CARAFATE) 1 g tablet Take 1 tablet (1 g total) by mouth 4 (four) times daily -  with meals and at bedtime. 02/06/21   Alfredia Client, PA-C  tamsulosin (FLOMAX) 0.4 MG CAPS capsule Take 1 capsule (0.4 mg total) by mouth daily. 01/01/21   Money, Lowry Ram, FNP  traZODone (DESYREL) 50 MG tablet Take 1 tablet (50 mg total) by mouth at bedtime as needed for sleep. 01/01/21   Money,  Lowry Ram, FNP    Allergies    Bee venom and Penicillins  Review of Systems   Review of Systems  Constitutional: Negative for chills, diaphoresis, fatigue and fever.  HENT: Negative for congestion, sore throat and trouble swallowing.   Eyes: Negative for pain and visual disturbance.  Respiratory: Negative for cough, shortness of breath and wheezing.   Cardiovascular: Negative for chest pain, palpitations and leg swelling.  Gastrointestinal: Positive for abdominal pain, nausea and vomiting. Negative for abdominal distention and diarrhea.  Genitourinary: Negative for difficulty urinating.  Musculoskeletal: Negative for back pain, neck pain and neck stiffness.  Skin: Negative for pallor.  Neurological: Negative for dizziness, speech difficulty, weakness and headaches.  Psychiatric/Behavioral: Negative for confusion.    Physical Exam Updated Vital Signs BP 136/86    Pulse 72    Temp 98.2 F (36.8 C) (Oral)    Resp 10    SpO2 96%   Physical Exam Constitutional:      General: He is not in acute distress.    Appearance: Normal appearance. He is not ill-appearing, toxic-appearing or diaphoretic.     Comments: Appears well.   HENT:     Mouth/Throat:     Mouth: Mucous membranes are moist.      Pharynx: Oropharynx is clear.  Eyes:     General: No scleral icterus.    Extraocular Movements: Extraocular movements intact.     Pupils: Pupils are equal, round, and reactive to light.  Cardiovascular:     Rate and Rhythm: Normal rate and regular rhythm.     Pulses: Normal pulses.     Heart sounds: Normal heart sounds.  Pulmonary:     Effort: Pulmonary effort is normal. No respiratory distress.     Breath sounds: Normal breath sounds. No stridor. No wheezing, rhonchi or rales.  Chest:     Chest wall: No tenderness.  Abdominal:     General: Abdomen is flat. There is no distension.     Palpations: Abdomen is soft.     Tenderness: There is generalized abdominal tenderness. There is no guarding or rebound.  Musculoskeletal:        General: No swelling or tenderness. Normal range of motion.     Cervical back: Normal range of motion and neck supple. No rigidity.     Right lower leg: No edema.     Left lower leg: No edema.  Skin:    General: Skin is warm and dry.     Capillary Refill: Capillary refill takes less than 2 seconds.     Coloration: Skin is not pale.  Neurological:     General: No focal deficit present.     Mental Status: He is alert and oriented to person, place, and time.  Psychiatric:        Mood and Affect: Mood normal.        Behavior: Behavior normal.     ED Results / Procedures / Treatments   Labs (all labs ordered are listed, but only abnormal results are displayed) Labs Reviewed  COMPREHENSIVE METABOLIC PANEL - Abnormal; Notable for the following components:      Result Value   Sodium 134 (*)    Chloride 96 (*)    AST 73 (*)    Alkaline Phosphatase 37 (*)    All other components within normal limits  CBC - Abnormal; Notable for the following components:   WBC 3.8 (*)    RDW 16.9 (*)    All other components within normal  limits  URINALYSIS, ROUTINE W REFLEX MICROSCOPIC - Abnormal; Notable for the following components:   Ketones, ur 20 (*)    All other  components within normal limits  LIPASE, BLOOD    EKG None  Radiology CT Abdomen Pelvis W Contrast  Result Date: 03/01/2021 CLINICAL DATA:  Abdominal pain. EXAM: CT ABDOMEN AND PELVIS WITH CONTRAST TECHNIQUE: Multidetector CT imaging of the abdomen and pelvis was performed using the standard protocol following bolus administration of intravenous contrast. CONTRAST:  157mL OMNIPAQUE IOHEXOL 300 MG/ML  SOLN COMPARISON:  02/06/2021 FINDINGS: Lower chest: The lung bases are clear. The heart size is normal. Hepatobiliary: The liver is normal. Cholelithiasis without acute inflammation.There is no biliary ductal dilation. Pancreas: Normal contours without ductal dilatation. No peripancreatic fluid collection. Spleen: Unremarkable. Adrenals/Urinary Tract: --Adrenal glands: Unremarkable. --Right kidney/ureter: No hydronephrosis or radiopaque kidney stones. --Left kidney/ureter: No hydronephrosis or radiopaque kidney stones. --Urinary bladder: Unremarkable. Stomach/Bowel: --Stomach/Duodenum: No hiatal hernia or other gastric abnormality. Normal duodenal course and caliber. --Small bowel: Unremarkable. --Colon: There is scattered colonic diverticula without CT evidence for diverticulitis. --Appendix: Normal. Vascular/Lymphatic: Atherosclerotic calcification is present within the non-aneurysmal abdominal aorta, without hemodynamically significant stenosis. --No retroperitoneal lymphadenopathy. --No mesenteric lymphadenopathy. --No pelvic or inguinal lymphadenopathy. Reproductive: Unremarkable Other: No ascites or free air. The abdominal wall is normal. Musculoskeletal. Patient is status post prior total hip arthroplasty on the left. There is very early right femoral head avascular necrosis. IMPRESSION: 1. No acute abdominopelvic abnormality. 2. Cholelithiasis without acute inflammation. 3. Very early right femoral head avascular necrosis. Aortic Atherosclerosis (ICD10-I70.0). Electronically Signed   By: Constance Holster M.D.   On: 03/01/2021 16:38    Procedures Procedures   Medications Ordered in ED Medications  sodium chloride 0.9 % bolus 1,000 mL (0 mLs Intravenous Stopped 03/01/21 1605)  morphine 2 MG/ML injection 4 mg (4 mg Intravenous Given 03/01/21 1455)  iohexol (OMNIPAQUE) 300 MG/ML solution 100 mL (100 mLs Intravenous Contrast Given 03/01/21 1610)  acetaminophen (TYLENOL) tablet 650 mg (650 mg Oral Given 03/01/21 1727)  LORazepam (ATIVAN) tablet 0.5 mg (0.5 mg Oral Given 03/01/21 1807)    ED Course  I have reviewed the triage vital signs and the nursing notes.  Pertinent labs & imaging results that were available during my care of the patient were reviewed by me and considered in my medical decision making (see chart for details).    MDM Rules/Calculators/A&P                         Elliott Lasecki. is a 60 y.o. male with past medical history of alcohol abuse, about 8 cans of beer a day, acid reflux, bipolar disorder, chronic pain, diabetes, hyperlipidemia, hypertension, pancreatitis, pseudocyst of the pancreas that presents the emerge department today for abdominal pain, nausea and vomiting, diarrhea.  Differential diagnoses considered include viral gastroenteritis, colitis, acid reflux.   Initial interventions fluids and morphine.  Patient does have history of prolonged QTC, EKG today with QTC 508.\  Labs unremarkable.  CT abdomen pelvis without any acute intra-abdominal pathology.  Did provide patient with copy of this in regards to incidental findings which he can follow-up with PCP.  Upon reassessment states that he feels better, states that he is ready to eat.  Asking for sandwich, after eating thishe states that he feels nauseous again patient did eat the entire sandwich and ginger ale and kept it down.  I did give him some Ativan since he does have a  prolonged QTC, upon reevaluation an hour after this he states that he feels much better, asking to leave at this time.  Given the above  findings, my suspicion is that patient most likely has viral gastroenteritis.  Patient will follow with PCP.Pt to follow up with PCP.   Doubt need for further emergent work up at this time. I explained the diagnosis and have given explicit precautions to return to the ER including for any other new or worsening symptoms. The patient understands and accepts the medical plan as it's been dictated and I have answered their questions. Discharge instructions concerning home care and prescriptions have been given. The patient is STABLE and is discharged to home in good condition.  Final Clinical Impression(s) / ED Diagnoses Final diagnoses:  Viral gastroenteritis    Rx / DC Orders ED Discharge Orders    None       Alfredia Client, PA-C 03/01/21 1854    Fredia Sorrow, MD 03/13/21 [phone]

## 2021-03-01 NOTE — ED Notes (Signed)
Pt ate a sandwich and drank a can of ginger ale states he is now nauseated. EDP made aware.

## 2021-03-01 NOTE — ED Triage Notes (Signed)
Abdominal pain onset yesterday

## 2021-03-01 NOTE — ED Triage Notes (Signed)
Also c/o nausea and vomiting

## 2021-03-01 NOTE — ED Notes (Signed)
Pt notified of need for urine sample. Pt unable to give sample at this time.

## 2021-03-01 NOTE — Discharge Instructions (Addendum)
You are seen today for gastroenteritis, as we discussed you most likely have food poisoning.  I want to use the attached instructions.  Make sure to stay hydrated, if you have any new worsening concerning symptoms please come back to the emergency department.  Please speak to your primary care doctor about the incidental findings of your CT which is below.  I want you to follow-up with your primary care in the next couple of days.  Your white count was also slightly low today, which have this rechecked with your PCP in the next couple of days.  IMPRESSION:  1. No acute abdominopelvic abnormality.  2. Cholelithiasis without acute inflammation.  3. Very early right femoral head avascular necrosis.    Get help right away if you: Have chest pain. Feel extremely weak or you faint. See blood in your vomit. Have vomit that looks like coffee grounds. Have bloody or black stools or stools that look like tar. Have a severe headache, a stiff neck, or both. Have a rash. Have severe pain, cramping, or bloating in your abdomen. Have trouble breathing or you are breathing very quickly. Have a fast heartbeat. Have skin that feels cold and clammy. Feel confused. Have pain when you urinate. Have signs of dehydration, such as: Dark urine, very little urine, or no urine. Cracked lips. Dry mouth. Sunken eyes. Sleepiness. Weakness.

## 2021-03-01 NOTE — ED Notes (Signed)
Pt given sandwich and something to drink per EDP request.

## 2021-03-02 ENCOUNTER — Other Ambulatory Visit: Payer: Self-pay | Admitting: Orthopedic Surgery

## 2021-03-02 DIAGNOSIS — M25561 Pain in right knee: Secondary | ICD-10-CM

## 2021-03-02 DIAGNOSIS — G8929 Other chronic pain: Secondary | ICD-10-CM

## 2021-03-02 NOTE — Telephone Encounter (Signed)
Patient requests Oxycodone 5 mgs.  Qty 28  Sig: TAKE ONE TABLET EVERY 6 HOURS AS NEEDED FOR 7 DAYS  Patient uses [facility]

## 2021-03-05 ENCOUNTER — Telehealth: Payer: Self-pay | Admitting: Orthopedic Surgery

## 2021-03-05 MED ORDER — OXYCODONE HCL 5 MG PO TABS: ORAL_TABLET | ORAL | 0 refills | Status: DC

## 2021-03-05 NOTE — Telephone Encounter (Signed)
Patient called request refill for pain medicine    oxyCODONE (OXY IR/ROXICODONE) 5 MG immediate release tablet     Pharmacy:  [facility]

## 2021-03-13 ENCOUNTER — Other Ambulatory Visit: Payer: Self-pay | Admitting: Orthopedic Surgery

## 2021-03-13 DIAGNOSIS — M25561 Pain in right knee: Secondary | ICD-10-CM

## 2021-03-13 DIAGNOSIS — G8929 Other chronic pain: Secondary | ICD-10-CM

## 2021-03-13 NOTE — Telephone Encounter (Signed)
Patient called to request refill:  oxyCODONE (OXY IR/ROXICODONE) 5 MG immediate release tablet 28 tablet  [place]

## 2021-03-14 ENCOUNTER — Other Ambulatory Visit: Payer: Self-pay | Admitting: Orthopedic Surgery

## 2021-03-14 DIAGNOSIS — G8929 Other chronic pain: Secondary | ICD-10-CM

## 2021-03-21 ENCOUNTER — Telehealth: Payer: Self-pay | Admitting: Orthopedic Surgery

## 2021-03-21 DIAGNOSIS — M25561 Pain in right knee: Secondary | ICD-10-CM

## 2021-03-21 DIAGNOSIS — G8929 Other chronic pain: Secondary | ICD-10-CM

## 2021-03-21 NOTE — Telephone Encounter (Signed)
Patient called, states [name] [name] relayed they not have his prescription, which is noted as being issued 03/14/21: oxyCODONE (OXY IR/ROXICODONE) 5 MG immediate release tablet 28 tablet  [name] [name] Please advise patient.

## 2021-03-22 ENCOUNTER — Other Ambulatory Visit: Payer: Self-pay | Admitting: Orthopedic Surgery

## 2021-03-22 DIAGNOSIS — M25561 Pain in right knee: Secondary | ICD-10-CM

## 2021-03-22 DIAGNOSIS — G8929 Other chronic pain: Secondary | ICD-10-CM

## 2021-03-22 MED ORDER — OXYCODONE HCL 5 MG PO TABS
5.0000 mg | ORAL_TABLET | Freq: Four times a day (QID) | ORAL | 0 refills | Status: DC | PRN
Start: 2021-03-22 — End: 2021-03-29

## 2021-03-22 NOTE — Progress Notes (Signed)
Meds ordered this encounter  Medications  . oxyCODONE (OXY IR/ROXICODONE) 5 MG immediate release tablet    Sig: Take 1 tablet (5 mg total) by mouth every 6 (six) hours as needed for up to 7 days for severe pain.    Dispense:  28 tablet    Refill:  0

## 2021-03-29 ENCOUNTER — Other Ambulatory Visit: Payer: Self-pay | Admitting: Orthopedic Surgery

## 2021-03-29 DIAGNOSIS — M25561 Pain in right knee: Secondary | ICD-10-CM

## 2021-03-29 DIAGNOSIS — G8929 Other chronic pain: Secondary | ICD-10-CM

## 2021-03-29 NOTE — Telephone Encounter (Signed)
Duplicate request

## 2021-03-29 NOTE — Telephone Encounter (Signed)
Patient called for refill: oxyCODONE (OXY IR/ROXICODONE) 5 MG immediate release tablet 28 tablet  [facility]

## 2021-03-29 NOTE — Telephone Encounter (Signed)
Done

## 2021-04-05 ENCOUNTER — Other Ambulatory Visit: Payer: Self-pay | Admitting: Orthopedic Surgery

## 2021-04-05 DIAGNOSIS — G8929 Other chronic pain: Secondary | ICD-10-CM

## 2021-04-05 NOTE — Telephone Encounter (Signed)
Rx refill requests

## 2021-04-23 ENCOUNTER — Other Ambulatory Visit: Payer: Self-pay | Admitting: Orthopedic Surgery

## 2021-04-23 DIAGNOSIS — G8929 Other chronic pain: Secondary | ICD-10-CM

## 2021-04-23 DIAGNOSIS — M25561 Pain in right knee: Secondary | ICD-10-CM

## 2021-05-02 ENCOUNTER — Other Ambulatory Visit: Payer: Self-pay | Admitting: Orthopedic Surgery

## 2021-05-02 DIAGNOSIS — M25561 Pain in right knee: Secondary | ICD-10-CM

## 2021-05-02 DIAGNOSIS — G8929 Other chronic pain: Secondary | ICD-10-CM

## 2021-05-03 ENCOUNTER — Encounter (HOSPITAL_COMMUNITY): Payer: Self-pay | Admitting: Emergency Medicine

## 2021-05-03 ENCOUNTER — Other Ambulatory Visit: Payer: Self-pay

## 2021-05-03 ENCOUNTER — Emergency Department (HOSPITAL_COMMUNITY)
Admission: EM | Admit: 2021-05-03 | Discharge: 2021-05-04 | Disposition: A | Discharge status: Other Acute Inpatient Hospital | Payer: Medicare Other | Attending: Emergency Medicine | Admitting: Emergency Medicine

## 2021-05-03 ENCOUNTER — Telehealth: Payer: Self-pay | Admitting: Orthopedic Surgery

## 2021-05-03 DIAGNOSIS — Z20822 Contact with and (suspected) exposure to covid-19: Secondary | ICD-10-CM | POA: Diagnosis not present

## 2021-05-03 DIAGNOSIS — I10 Essential (primary) hypertension: Secondary | ICD-10-CM | POA: Diagnosis not present

## 2021-05-03 DIAGNOSIS — Z96642 Presence of left artificial hip joint: Secondary | ICD-10-CM | POA: Diagnosis not present

## 2021-05-03 DIAGNOSIS — R45851 Suicidal ideations: Secondary | ICD-10-CM | POA: Insufficient documentation

## 2021-05-03 DIAGNOSIS — E1140 Type 2 diabetes mellitus with diabetic neuropathy, unspecified: Secondary | ICD-10-CM | POA: Diagnosis not present

## 2021-05-03 DIAGNOSIS — Y900 Blood alcohol level of less than 20 mg/100 ml: Secondary | ICD-10-CM | POA: Diagnosis not present

## 2021-05-03 DIAGNOSIS — F17210 Nicotine dependence, cigarettes, uncomplicated: Secondary | ICD-10-CM | POA: Insufficient documentation

## 2021-05-03 DIAGNOSIS — F250 Schizoaffective disorder, bipolar type: Secondary | ICD-10-CM | POA: Diagnosis not present

## 2021-05-03 DIAGNOSIS — Z79899 Other long term (current) drug therapy: Secondary | ICD-10-CM | POA: Insufficient documentation

## 2021-05-03 DIAGNOSIS — Z7984 Long term (current) use of oral hypoglycemic drugs: Secondary | ICD-10-CM | POA: Insufficient documentation

## 2021-05-03 LAB — CBC
HCT: 44.1 % (ref 39.0–52.0)
Hemoglobin: 14.7 g/dL (ref 13.0–17.0)
MCH: 30.1 pg (ref 26.0–34.0)
MCHC: 33.3 g/dL (ref 30.0–36.0)
MCV: 90.2 fL (ref 80.0–100.0)
Platelets: 292 10*3/uL (ref 150–400)
RBC: 4.89 MIL/uL (ref 4.22–5.81)
RDW: 15.3 % (ref 11.5–15.5)
WBC: 4.6 10*3/uL (ref 4.0–10.5)
nRBC: 0 % (ref 0.0–0.2)

## 2021-05-03 LAB — COMPREHENSIVE METABOLIC PANEL
ALT: 22 U/L (ref 0–44)
AST: 37 U/L (ref 15–41)
Albumin: 4 g/dL (ref 3.5–5.0)
Alkaline Phosphatase: 35 U/L — ABNORMAL LOW (ref 38–126)
Anion gap: 8 (ref 5–15)
BUN: 11 mg/dL (ref 6–20)
CO2: 22 mmol/L (ref 22–32)
Calcium: 9.2 mg/dL (ref 8.9–10.3)
Chloride: 102 mmol/L (ref 98–111)
Creatinine, Ser: 0.96 mg/dL (ref 0.61–1.24)
GFR, Estimated: >60 mL/min (ref >60)
Glucose, Bld: 90 mg/dL (ref 70–99)
Potassium: 4 mmol/L (ref 3.5–5.1)
Sodium: 132 mmol/L — ABNORMAL LOW (ref 135–145)
Total Bilirubin: 0.6 mg/dL (ref 0.3–1.2)
Total Protein: 7.3 g/dL (ref 6.5–8.1)

## 2021-05-03 LAB — RAPID URINE DRUG SCREEN, HOSP PERFORMED
Amphetamines: NOT DETECTED
Barbiturates: NOT DETECTED
Benzodiazepines: NOT DETECTED
Cocaine: POSITIVE — AB
Opiates: NOT DETECTED
Tetrahydrocannabinol: NOT DETECTED

## 2021-05-03 LAB — RESP PANEL BY RT-PCR (FLU A&B, COVID) ARPGX2
Influenza A by PCR: NEGATIVE
Influenza B by PCR: NEGATIVE
SARS Coronavirus 2 by RT PCR: NEGATIVE

## 2021-05-03 LAB — SALICYLATE LEVEL: Salicylate Lvl: <7 mg/dL — ABNORMAL LOW (ref 7.0–30.0)

## 2021-05-03 LAB — ACETAMINOPHEN LEVEL: Acetaminophen (Tylenol), Serum: <10 ug/mL — ABNORMAL LOW (ref 10–30)

## 2021-05-03 LAB — ETHANOL: Alcohol, Ethyl (B): 10 mg/dL — ABNORMAL HIGH (ref <10)

## 2021-05-03 NOTE — Telephone Encounter (Signed)
Patient called again requesting pain medication. It appears that this was refused by Dr. Aline Brochure.   Would you double check that for me and call him to advise?   Thanks

## 2021-05-03 NOTE — ED Notes (Signed)
Pt has been sleeping since snack after TTS, pt ambulated to the bathroom to void.
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
## 2021-05-03 NOTE — ED Notes (Signed)
Lytle Michaels ordered for pt

## 2021-05-03 NOTE — ED Notes (Signed)
Pt is having TTS done

## 2021-05-03 NOTE — Telephone Encounter (Signed)
He needs an appt   On chronic opioid therapy

## 2021-05-03 NOTE — Telephone Encounter (Signed)
What should I advise him regarding the Oxycodone? TIme to d/c?

## 2021-05-03 NOTE — ED Provider Notes (Signed)
Emergency Medicine Provider Triage Evaluation Note  [name] [name]. , a 60 y.o. male  was evaluated in triage.  Pt for eval for detox from drugs and etoh. States he "kind of" has thoughts of SI. He denies plan.  Review of Systems  Positive: Substance use, depression, si Negative: fevers  Physical Exam  BP (!) 146/74 (BP Location: Left Arm)   Pulse (!) 56   Temp 98.9 F (37.2 C) (Oral)   Resp 15   Ht 5\' 10"  (1.778 m)   Wt 86.2 kg   SpO2 97%   BMI 27.26 kg/m  Gen:   Awake, no distress   Resp:  Normal effort  MSK:   Moves extremities without difficulty  Other:  Si, no plan  Medical Decision Making  Medically screening exam initiated at 12:26 PM.  Appropriate orders placed.  [name] was informed that the remainder of the evaluation will be completed by another provider, this initial triage assessment does not replace that evaluation, and the importance of remaining in the ED until their evaluation is complete.     Rodney Booze, PA-C 05/03/21 [place], [place], DO 05/03/21 1411

## 2021-05-03 NOTE — ED Triage Notes (Signed)
Pt states he is feeling suicidal and also wants to detox from cocaine, heroine, marijuana and alcohol. Pt drinks a 12 pack of  Beer daily. Pt last used cocaine and marijuana this morning.
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
## 2021-05-04 ENCOUNTER — Ambulatory Visit (HOSPITAL_COMMUNITY)
Admission: EM | Admit: 2021-05-04 | Discharge: 2021-05-04 | Disposition: A | Discharge status: Home / Self Care | Payer: Medicare Other | Attending: Nurse Practitioner | Admitting: Nurse Practitioner

## 2021-05-04 DIAGNOSIS — F1994 Other psychoactive substance use, unspecified with psychoactive substance-induced mood disorder: Secondary | ICD-10-CM | POA: Insufficient documentation

## 2021-05-04 DIAGNOSIS — F1020 Alcohol dependence, uncomplicated: Secondary | ICD-10-CM | POA: Insufficient documentation

## 2021-05-04 DIAGNOSIS — Z79899 Other long term (current) drug therapy: Secondary | ICD-10-CM | POA: Insufficient documentation

## 2021-05-04 DIAGNOSIS — F1410 Cocaine abuse, uncomplicated: Secondary | ICD-10-CM | POA: Diagnosis not present

## 2021-05-04 DIAGNOSIS — R45851 Suicidal ideations: Secondary | ICD-10-CM | POA: Insufficient documentation

## 2021-05-04 DIAGNOSIS — F17210 Nicotine dependence, cigarettes, uncomplicated: Secondary | ICD-10-CM | POA: Diagnosis not present

## 2021-05-04 DIAGNOSIS — Z560 Unemployment, unspecified: Secondary | ICD-10-CM | POA: Diagnosis not present

## 2021-05-04 DIAGNOSIS — F32A Depression, unspecified: Secondary | ICD-10-CM | POA: Diagnosis not present

## 2021-05-04 LAB — GLUCOSE, CAPILLARY
Glucose-Capillary: 241 mg/dL — ABNORMAL HIGH (ref 70–99)
Glucose-Capillary: 83 mg/dL (ref 70–99)

## 2021-05-04 MED ORDER — LEVOCETIRIZINE DIHYDROCHLORIDE 5 MG PO TABS: 5.0000 mg | ORAL_TABLET | Freq: Every day | ORAL | Status: DC

## 2021-05-04 MED ORDER — TRAZODONE HCL 50 MG PO TABS: 50.0000 mg | ORAL_TABLET | Freq: Every evening | ORAL | 0 refills | Status: DC | PRN

## 2021-05-04 MED ORDER — PANTOPRAZOLE SODIUM 40 MG PO TBEC: 40.0000 mg | DELAYED_RELEASE_TABLET | Freq: Every day | ORAL | 0 refills | Status: DC

## 2021-05-04 MED ORDER — AMLODIPINE BESYLATE 5 MG PO TABS: 5.0000 mg | ORAL_TABLET | Freq: Every day | ORAL | 0 refills | Status: DC

## 2021-05-04 MED ORDER — ONDANSETRON 4 MG PO TBDP: 4.0000 mg | ORAL_TABLET | Freq: Four times a day (QID) | ORAL | Status: DC | PRN

## 2021-05-04 MED ORDER — VASCEPA 1 G PO CAPS: 2.0000 g | ORAL_CAPSULE | Freq: Two times a day (BID) | ORAL | 0 refills | Status: DC

## 2021-05-04 MED ORDER — TRAZODONE HCL 50 MG PO TABS
50.0000 mg | ORAL_TABLET | Freq: Every evening | ORAL | Status: DC | PRN
  Filled 2021-05-04: qty 7

## 2021-05-04 MED ORDER — GABAPENTIN 300 MG PO CAPS
300.0000 mg | ORAL_CAPSULE | Freq: Three times a day (TID) | ORAL | Status: DC
  Filled 2021-05-04: qty 1

## 2021-05-04 MED ORDER — ICOSAPENT ETHYL 1 G PO CAPS
2.0000 g | ORAL_CAPSULE | Freq: Two times a day (BID) | ORAL | Status: DC
  Filled 2021-05-04 (×3): qty 2

## 2021-05-04 MED ORDER — TAMSULOSIN HCL 0.4 MG PO CAPS
0.4000 mg | ORAL_CAPSULE | Freq: Every day | ORAL | Status: DC
  Administered 2021-05-04: 0.4 mg via ORAL
  Filled 2021-05-04: qty 7
  Filled 2021-05-04: qty 1

## 2021-05-04 MED ORDER — GABAPENTIN 300 MG PO CAPS: 300.0000 mg | ORAL_CAPSULE | Freq: Three times a day (TID) | ORAL | 0 refills | Status: DC

## 2021-05-04 MED ORDER — PANTOPRAZOLE SODIUM 20 MG PO TBEC
40.0000 mg | DELAYED_RELEASE_TABLET | Freq: Every day | ORAL | Status: DC
  Administered 2021-05-04: 40 mg via ORAL
  Filled 2021-05-04: qty 1
  Filled 2021-05-04: qty 14
  Filled 2021-05-04: qty 7

## 2021-05-04 MED ORDER — CITALOPRAM HYDROBROMIDE 20 MG PO TABS: 20.0000 mg | ORAL_TABLET | Freq: Every day | ORAL | 0 refills | Status: DC

## 2021-05-04 MED ORDER — ACETAMINOPHEN 325 MG PO TABS: 650.0000 mg | ORAL_TABLET | Freq: Four times a day (QID) | ORAL | Status: DC | PRN

## 2021-05-04 MED ORDER — MAGNESIUM HYDROXIDE 400 MG/5ML PO SUSP: 30.0000 mL | Freq: Every day | ORAL | Status: DC | PRN

## 2021-05-04 MED ORDER — METFORMIN HCL 500 MG PO TABS: 1000.0000 mg | ORAL_TABLET | Freq: Two times a day (BID) | ORAL | 0 refills | Status: DC

## 2021-05-04 MED ORDER — GLIPIZIDE 5 MG PO TABS
5.0000 mg | ORAL_TABLET | Freq: Every day | ORAL | Status: DC
  Administered 2021-05-04: 5 mg via ORAL
  Filled 2021-05-04: qty 7
  Filled 2021-05-04: qty 1

## 2021-05-04 MED ORDER — LORATADINE 10 MG PO TABS: 10.0000 mg | ORAL_TABLET | Freq: Every day | ORAL | 0 refills | Status: DC

## 2021-05-04 MED ORDER — ALUM & MAG HYDROXIDE-SIMETH 200-200-20 MG/5ML PO SUSP: 30.0000 mL | ORAL | Status: DC | PRN

## 2021-05-04 MED ORDER — HYDROXYZINE HCL 25 MG PO TABS: 25.0000 mg | ORAL_TABLET | Freq: Four times a day (QID) | ORAL | Status: DC | PRN

## 2021-05-04 MED ORDER — LOPERAMIDE HCL 2 MG PO CAPS: 2.0000 mg | ORAL_CAPSULE | ORAL | Status: DC | PRN

## 2021-05-04 MED ORDER — TAMSULOSIN HCL 0.4 MG PO CAPS: 0.4000 mg | ORAL_CAPSULE | Freq: Every day | ORAL | 0 refills | Status: DC

## 2021-05-04 MED ORDER — BENAZEPRIL HCL 10 MG PO TABS: 10.0000 mg | ORAL_TABLET | Freq: Every day | ORAL | 0 refills | Status: DC

## 2021-05-04 MED ORDER — LORATADINE 10 MG PO TABS
10.0000 mg | ORAL_TABLET | Freq: Every day | ORAL | Status: DC
  Administered 2021-05-04: 10 mg via ORAL
  Filled 2021-05-04: qty 7
  Filled 2021-05-04: qty 1

## 2021-05-04 MED ORDER — INSULIN GLARGINE 100 UNIT/ML ~~LOC~~ SOLN
20.0000 [IU] | Freq: Every day | SUBCUTANEOUS | Status: DC
  Administered 2021-05-04: 20 [IU] via SUBCUTANEOUS
  Filled 2021-05-04: qty 10

## 2021-05-04 MED ORDER — METFORMIN HCL 500 MG PO TABS
1000.0000 mg | ORAL_TABLET | Freq: Two times a day (BID) | ORAL | Status: DC
  Administered 2021-05-04: 1000 mg via ORAL
  Filled 2021-05-04: qty 2
  Filled 2021-05-04: qty 28

## 2021-05-04 MED ORDER — AMLODIPINE BESY-BENAZEPRIL HCL 5-10 MG PO CAPS: 1.0000 | ORAL_CAPSULE | Freq: Every day | ORAL | Status: DC

## 2021-05-04 MED ORDER — GLIPIZIDE 5 MG PO TABS: 5.0000 mg | ORAL_TABLET | Freq: Every day | ORAL | 0 refills | Status: DC

## 2021-05-04 MED ORDER — GABAPENTIN 300 MG PO CAPS
300.0000 mg | ORAL_CAPSULE | Freq: Three times a day (TID) | ORAL | Status: DC
  Administered 2021-05-04: 300 mg via ORAL
  Filled 2021-05-04: qty 1
  Filled 2021-05-04: qty 21

## 2021-05-04 MED ORDER — BENAZEPRIL HCL 10 MG PO TABS
10.0000 mg | ORAL_TABLET | Freq: Every day | ORAL | Status: DC
  Administered 2021-05-04: 10 mg via ORAL
  Filled 2021-05-04: qty 7
  Filled 2021-05-04: qty 1

## 2021-05-04 MED ORDER — INSULIN ASPART 100 UNIT/ML IJ SOLN
0.0000 [IU] | Freq: Three times a day (TID) | INTRAMUSCULAR | Status: DC
  Administered 2021-05-04: 2 [IU] via SUBCUTANEOUS

## 2021-05-04 MED ORDER — INSULIN GLARGINE 100 UNIT/ML ~~LOC~~ SOLN: 20.0000 [IU] | Freq: Every day | SUBCUTANEOUS | 11 refills | Status: DC

## 2021-05-04 MED ORDER — CITALOPRAM HYDROBROMIDE 20 MG PO TABS
20.0000 mg | ORAL_TABLET | Freq: Every day | ORAL | Status: DC
  Administered 2021-05-04: 20 mg via ORAL
  Filled 2021-05-04: qty 1
  Filled 2021-05-04: qty 7

## 2021-05-04 MED ORDER — AMLODIPINE BESYLATE 5 MG PO TABS
5.0000 mg | ORAL_TABLET | Freq: Every day | ORAL | Status: DC
  Administered 2021-05-04: 5 mg via ORAL
  Filled 2021-05-04: qty 1
  Filled 2021-05-04: qty 7

## 2021-05-04 NOTE — ED Provider Notes (Signed)
Behavioral Health Admission H&P [facility] & OBS)  Date: 05/04/21 Patient Name: [name] [name]. MRN: [id number] Chief Complaint:  Chief Complaint  Patient presents with  . Drug Problem  . Alcohol Problem  . Suicidal      Diagnoses:  Final diagnoses:  Substance induced mood disorder (HCC)  Cocaine abuse ([place])  Alcohol use disorder, severe, dependence ([place])    HPI: Courtney Fenlon is a 60 y.o. male with a history of cocaine abuse, alcohol abuse opioid abuse who presented to [facility] due to SI and requesting substance abuse treatment. He is well known to behavioral health and emergency departments. He was transferred to [facility] for continuous assessment.  On evaluation at [facility], patient is alert and oriented x 4. He is pleasant and cooperative. Speech is clear and coherent. He reports that he has been feeling more depressed. He continues to report SI with no intent or plan. He reports HI towards "lots of people" but denies intent or plan. He reports that he drinks a case of beer daily. Last drink was yesterday. BAL <10 in the ED. Reports using cocaine several days a week, last use yesterday. Reports using marijuana daily. Reports occasional use of heroin, last use approximately 2 weeks ago. UDS positive for cocaine, otherwise negative. He denies auditory and visual hallucinations. No indication that he is responding to internal stimuli.   Patient was last admitted for continuous assessment at [facility] on 01/01/21. He was discharged to [facility].     PHQ 2-9:  [place] from 12/31/2020 in [facility]  Thoughts that you would be better off dead, or of hurting yourself in some way Several days  [Phreesia 12/31/2020]  PHQ-9 Total Score 16      [facility] ED from 05/04/2021 in [facility] ED from 05/03/2021 in [place] ED from 03/01/2021 in [place]  Error: Q7 should not be populated when Q6 is No Error: Q7 should not be populated when Q6 is No No Risk       Total Time spent with patient: 30 minutes  Musculoskeletal  Strength & Muscle Tone: within normal limits Gait & Station: normal Patient leans: N/A  Psychiatric Specialty Exam  Presentation General Appearance: Appropriate for Environment; Casual  Eye Contact:Good  Speech:Clear and Coherent; Normal Rate  Speech Volume:Normal  Handedness:Right   Mood and Affect  Mood:Depressed; Anxious; Hopeless; Worthless  Affect:Congruent   Thought Process  Thought Processes:Coherent; Linear  Descriptions of Associations:Intact  Orientation:Full (Time, Place and Person)  Thought Content:WDL  Diagnosis of Schizophrenia or Schizoaffective disorder in past: No  Duration of Psychotic Symptoms: Less than six months  Hallucinations:Hallucinations: None  Ideas of Reference:None  Suicidal Thoughts:Suicidal Thoughts: Yes, Passive SI Passive Intent and/or Plan: Without Intent; Without Plan  Homicidal Thoughts:Homicidal Thoughts: No   Sensorium  Memory:Immediate Good; Recent Good; Remote Good  Judgment:Fair  Insight:Fair   Executive Functions  Concentration:Good  Attention Span:Good  Recall:Good  Fund of Knowledge:Good  Language:Good   Psychomotor Activity  Psychomotor Activity:Psychomotor Activity: Normal   Assets  Assets:Communication Skills; Desire for Improvement; Financial Resources/Insurance   Sleep  Sleep:Sleep: Fair   Nutritional Assessment (For OBS and FBC admissions only) Has the patient had a weight loss or gain of 10 pounds or more in the last 3 months?: No Has the patient had a decrease in food intake/or appetite?: No Does the patient have dental problems?: No Does the patient  have eating habits or behaviors that may be indicators of an eating disorder including binging or inducing vomiting?: No Has the patient recently lost weight  without trying?: No Has the patient been eating poorly because of a decreased appetite?: No Malnutrition Screening Tool Score: 0    Physical Exam Constitutional:      General: He is not in acute distress.    Appearance: He is not ill-appearing, toxic-appearing or diaphoretic.  HENT:     Head: Normocephalic.     Right Ear: External ear normal.     Left Ear: External ear normal.  Eyes:     Pupils: Pupils are equal, round, and reactive to light.  Cardiovascular:     Rate and Rhythm: Normal rate.  Pulmonary:     Effort: Pulmonary effort is normal. No respiratory distress.  Musculoskeletal:        General: Normal range of motion.  Neurological:     Mental Status: He is alert and oriented to person, place, and time.  Psychiatric:        Mood and Affect: Mood is depressed.        Speech: Speech normal.        Behavior: Behavior is cooperative.        Thought Content: Thought content is not paranoid or delusional. Thought content includes homicidal and suicidal ideation. Thought content does not include homicidal or suicidal plan.    Review of Systems  Constitutional: Negative for chills, diaphoresis, fever, malaise/fatigue and weight loss.  HENT: Negative for congestion.   Respiratory: Negative for cough and shortness of breath.   Cardiovascular: Negative for chest pain and palpitations.  Gastrointestinal: Negative for diarrhea, nausea and vomiting.  Neurological: Negative for dizziness and seizures.  Psychiatric/Behavioral: Positive for depression, substance abuse and suicidal ideas. Negative for hallucinations and memory loss. The patient is nervous/anxious and has insomnia.   All other systems reviewed and are negative.   Blood pressure (!) 124/98, pulse 69, temperature 98.7 F (37.1 C), temperature source Oral, resp. rate 18, SpO2 98 %. There is no height or weight on file to calculate BMI.  Past Psychiatric History: MDD, alcohol abuse, opioid abuse, cocaine abuse  Is the  patient at risk to self? Yes  Has the patient been a risk to self in the past 6 months? Yes .    Has the patient been a risk to self within the distant past? Yes   Is the patient a risk to others? No   Has the patient been a risk to others in the past 6 months? No   Has the patient been a risk to others within the distant past? No   Past Medical History:  Past Medical History:  Diagnosis Date  . Acid reflux   . Alcohol abuse    6-8 cans of beer daily; hx of incarceration for DWI  . Arthritis   . Bipolar disorder (HCC)   . Bronchitis   . Chronic back pain   . Chronic neck pain   . Chronic pain   . Diabetes mellitus without complication (HCC)   . Fracture of lower leg   . Gout   . Hyperlipidemia   . Hypertension   . Left arm pain    chronic  . Migraine   . Pancreatitis    March 2013  . Pseudocyst of pancreas 02/28/2012  . Substance abuse [facility])     Past Surgical History:  Procedure Laterality Date  . CIRCUMCISION  03/17/2012   Procedure: CIRCUMCISION  ADULT;  Surgeon: Ky Barban, MD;  Location: [facility] ORS;  Service: Urology;  Laterality: N/A;  . COLONOSCOPY WITH PROPOFOL  12/24/2012   BMW:UXLK lesion as described above status post biopsy; otherwise normal rectum/Sigmoid diverticulosis/Sigmoid polyps -- resected as described above. anal lesion, benign. colon polyp, hyperplastic  . ESOPHAGOGASTRODUODENOSCOPY (EGD) WITH PROPOFOL  12/24/2012   GMW:NUUVOZD erythema and erosions of uncertain significance status post gastric biopsy (reactive gastropathy NO h.pylori)  . KNEE ARTHROSCOPY Right 08/09/2019   Procedure: arthroscopic incision and drainage snovectomy;  Surgeon: Durene Romans, MD;  Location: [facility];  Service: Orthopedics;  Laterality: Right;  . KNEE ARTHROSCOPY WITH MEDIAL MENISECTOMY Right 07/29/2019   Procedure: RIGHT KNEE ARTHROSCOPY WITH MEDIAL MENISCECTOMY AND LATERAL MENISCECTOMY;  Surgeon: Vickki Hearing, MD;  Location: [facility];  Service: Orthopedics;  Laterality: Right;   . NOSE SURGERY     broken nose  . TOTAL HIP ARTHROPLASTY Left 10/04/2016   Procedure: LEFT TOTAL HIP ARTHROPLASTY ANTERIOR APPROACH;  Surgeon: Kathryne Hitch, MD;  Location: [facility];  Service: Orthopedics;  Laterality: Left;    Family History:  Family History  Problem Relation Age of Onset  . Diabetes Father   . Healthy Mother   . Anesthesia problems Neg Hx   . Hypotension Neg Hx   . Malignant hyperthermia Neg Hx   . Pseudochol deficiency Neg Hx   . Colon cancer Neg Hx   . Heart disease Neg Hx   . Stroke Neg Hx   . Cancer Neg Hx     Social History:  Social History   Socioeconomic History  . Marital status: Single    Spouse name: Not on file  . Number of children: Not on file  . Years of education: Not on file  . Highest education level: Not on file  Occupational History  . Occupation: unemployed  Tobacco Use  . Smoking status: Current Every Day Smoker    Packs/day: 0.50    Years: 25.00    Pack years: 12.50    Types: Cigarettes  . Smokeless tobacco: Former Neurosurgeon    Types: Chew    Quit date: 12/23/2014  Vaping Use  . Vaping Use: Never used  Substance and Sexual Activity  . Alcohol use: Yes    Comment: heavily  . Drug use: Yes    Types: Cocaine, Marijuana  . Sexual activity: Not Currently  Other Topics Concern  . Not on file  Social History Narrative  . Not on file   Social Determinants of Health   Financial Resource Strain: Not on file  Food Insecurity: Not on file  Transportation Needs: Not on file  Physical Activity: Not on file  Stress: Not on file  Social Connections: Not on file  Intimate Partner Violence: Not on file    SDOH:  SDOH Screenings   Alcohol Screen: Not on file  Depression (PHQ2-9): Medium Risk  . PHQ-2 Score: 16  Financial Resource Strain: Not on file  Food Insecurity: Not on file  Housing: Not on file  Physical Activity: Not on file  Social Connections: Not on file  Stress: Not on file  Tobacco Use: High Risk  .  Smoking Tobacco Use: Current Every Day Smoker  . Smokeless Tobacco Use: Former Dispensing optician Needs: Not on file    Last Labs:  Admission on 05/03/2021, Discharged on 05/04/2021  Component Date Value Ref Range Status  . Sodium 05/03/2021 132* 135 - 145 mmol/L Final  . Potassium 05/03/2021 4.0  3.5 - 5.1  mmol/L Final  . Chloride 05/03/2021 102  98 - 111 mmol/L Final  . CO2 05/03/2021 22  22 - 32 mmol/L Final  . Glucose, Bld 05/03/2021 90  70 - 99 mg/dL Final   Glucose reference range applies only to samples taken after fasting for at least 8 hours.  . BUN 05/03/2021 11  6 - 20 mg/dL Final  . Creatinine, Ser 05/03/2021 0.96  0.61 - 1.24 mg/dL Final  . Calcium 05/03/2021 9.2  8.9 - 10.3 mg/dL Final  . Total Protein 05/03/2021 7.3  6.5 - 8.1 g/dL Final  . Albumin 05/03/2021 4.0  3.5 - 5.0 g/dL Final  . AST 05/03/2021 37  15 - 41 U/L Final  . ALT 05/03/2021 22  0 - 44 U/L Final  . Alkaline Phosphatase 05/03/2021 35* 38 - 126 U/L Final  . Total Bilirubin 05/03/2021 0.6  0.3 - 1.2 mg/dL Final  . GFR, Estimated 05/03/2021 >60  >60 mL/min Final   Comment: (NOTE) Calculated using the CKD-EPI Creatinine Equation (2021)   . Anion gap 05/03/2021 8  5 - 15 Final   Performed at [facility] [address] [place], [place] [address]  . Alcohol, Ethyl (B) 05/03/2021 10* <10 mg/dL Final   Comment: (NOTE) Lowest detectable limit for serum alcohol is 10 mg/dL.  For medical purposes only. Performed at [facility], [place] [address], [place], [place] [address]   . Salicylate Lvl 62/70/3500 <7.0* 7.0 - 30.0 mg/dL Final   Performed at [facility] [address], [place], [place] [address]  . Acetaminophen (Tylenol), Serum 05/03/2021 <10* 10 - 30 ug/mL Final   Comment: (NOTE) Therapeutic concentrations vary significantly. A range of 10-30 ug/mL  may be an effective concentration for many patients. However, some  are best treated at concentrations outside of this  range. Acetaminophen concentrations >150 ug/mL at 4 hours after ingestion  and >50 ug/mL at 12 hours after ingestion are often associated with  toxic reactions.  Performed at [facility], [place] [address], [place], [place] [address]   . WBC 05/03/2021 4.6  4.0 - 10.5 K/uL Final  . RBC 05/03/2021 4.89  4.22 - 5.81 MIL/uL Final  . Hemoglobin 05/03/2021 14.7  13.0 - 17.0 g/dL Final  . HCT 05/03/2021 44.1  39.0 - 52.0 % Final  . MCV 05/03/2021 90.2  80.0 - 100.0 fL Final  . MCH 05/03/2021 30.1  26.0 - 34.0 pg Final  . MCHC 05/03/2021 33.3  30.0 - 36.0 g/dL Final  . RDW 05/03/2021 15.3  11.5 - 15.5 % Final  . Platelets 05/03/2021 292  150 - 400 K/uL Final  . nRBC 05/03/2021 0.0  0.0 - 0.2 % Final   Performed at [facility], [place] [address] [place], [place] [address]  . Opiates 05/03/2021 NONE DETECTED  NONE DETECTED Final  . Cocaine 05/03/2021 POSITIVE* NONE DETECTED Final  . Benzodiazepines 05/03/2021 NONE DETECTED  NONE DETECTED Final  . Amphetamines 05/03/2021 NONE DETECTED  NONE DETECTED Final  . Tetrahydrocannabinol 05/03/2021 NONE DETECTED  NONE DETECTED Final  . Barbiturates 05/03/2021 NONE DETECTED  NONE DETECTED Final   Comment: (NOTE) DRUG SCREEN FOR MEDICAL PURPOSES ONLY.  IF CONFIRMATION IS NEEDED FOR ANY PURPOSE, NOTIFY LAB WITHIN 5 DAYS.  LOWEST DETECTABLE LIMITS FOR URINE DRUG SCREEN Drug Class                     Cutoff (ng/mL) Amphetamine and metabolites  1000 Barbiturate and metabolites    200 Benzodiazepine                 409 Tricyclics and metabolites     300 Opiates and metabolites        300 Cocaine and metabolites        300 THC                            50 Performed at [facility], [place] [address], [place], [place] [address]   . SARS Coronavirus 2 by RT PCR 05/03/2021 NEGATIVE  NEGATIVE Final   Comment: (NOTE) SARS-CoV-2 target nucleic acids are NOT DETECTED.  The SARS-CoV-2 RNA is generally detectable in upper  respiratory specimens during the acute phase of infection. The lowest concentration of SARS-CoV-2 viral copies this assay can detect is 138 copies/mL. A negative result does not preclude SARS-Cov-2 infection and should not be used as the sole basis for treatment or other patient management decisions. A negative result may occur with  improper specimen collection/handling, submission of specimen other than nasopharyngeal swab, presence of viral mutation(s) within the areas targeted by this assay, and inadequate number of viral copies(<138 copies/mL). A negative result must be combined with clinical observations, patient history, and epidemiological information. The expected result is Negative.  Fact Sheet for Patients:  EntrepreneurPulse.com.au  Fact Sheet for Healthcare Providers:  IncredibleEmployment.be  This test is no                          t yet approved or cleared by the [place] FDA and  has been authorized for detection and/or diagnosis of SARS-CoV-2 by FDA under an Emergency Use Authorization (EUA). This EUA will remain  in effect (meaning this test can be used) for the duration of the COVID-19 declaration under Section 564(b)(1) of the Act, 21 U.S.C.section 360bbb-3(b)(1), unless the authorization is terminated  or revoked sooner.      . Influenza A by PCR 05/03/2021 NEGATIVE  NEGATIVE Final  . Influenza B by PCR 05/03/2021 NEGATIVE  NEGATIVE Final   Comment: (NOTE) The Xpert Xpress SARS-CoV-2/FLU/RSV plus assay is intended as an aid in the diagnosis of influenza from Nasopharyngeal swab specimens and should not be used as a sole basis for treatment. Nasal washings and aspirates are unacceptable for Xpert Xpress SARS-CoV-2/FLU/RSV testing.  Fact Sheet for Patients: EntrepreneurPulse.com.au  Fact Sheet for Healthcare Providers: IncredibleEmployment.be  This test is not yet approved or  cleared by the [place] FDA and has been authorized for detection and/or diagnosis of SARS-CoV-2 by FDA under an Emergency Use Authorization (EUA). This EUA will remain in effect (meaning this test can be used) for the duration of the COVID-19 declaration under Section 564(b)(1) of the Act, 21 U.S.C. section 360bbb-3(b)(1), unless the authorization is terminated or revoked.  Performed at [facility], [place] [address], [place], [place] [address]   Admission on 03/01/2021, Discharged on 03/01/2021  Component Date Value Ref Range Status  . Lipase 03/01/2021 21  11 - 51 U/L Final   Performed at [facility], [address] [place], [place] [address]  . Sodium 03/01/2021 134* 135 - 145 mmol/L Final  . Potassium 03/01/2021 3.8  3.5 - 5.1 mmol/L Final  . Chloride 03/01/2021 96* 98 - 111 mmol/L Final  . CO2 03/01/2021 27  22 - 32 mmol/L Final  . Glucose, Bld 03/01/2021 90  70 - 99 mg/dL Final   Glucose reference range applies only to samples taken after fasting for at least 8 hours.  . BUN 03/01/2021 13  6 - 20 mg/dL Final  . Creatinine, Ser 03/01/2021 0.90  0.61 - 1.24 mg/dL Final  . Calcium 16/09/9603 8.9  8.9 - 10.3 mg/dL Final  . Total Protein 03/01/2021 7.9  6.5 - 8.1 g/dL Final  . Albumin 54/08/8118 4.0  3.5 - 5.0 g/dL Final  . AST 14/78/2956 73* 15 - 41 U/L Final  . ALT 03/01/2021 22  0 - 44 U/L Final  . Alkaline Phosphatase 03/01/2021 37* 38 - 126 U/L Final  . Total Bilirubin 03/01/2021 0.6  0.3 - 1.2 mg/dL Final  . GFR, Estimated 03/01/2021 >60  >60 mL/min Final   Comment: (NOTE) Calculated using the CKD-EPI Creatinine Equation (2021)   . Anion gap 03/01/2021 11  5 - 15 Final   Performed at [facility], [address] [place], [place] [address]  . WBC 03/01/2021 3.8* 4.0 - 10.5 K/uL Final  . RBC 03/01/2021 5.06  4.22 - 5.81 MIL/uL Final  . Hemoglobin 03/01/2021 15.4  13.0 - 17.0 g/dL Final  . HCT 65/78/4696 46.4  39.0 - 52.0 % Final  . MCV 03/01/2021 91.7  80.0 -  100.0 fL Final  . MCH 03/01/2021 30.4  26.0 - 34.0 pg Final  . MCHC 03/01/2021 33.2  30.0 - 36.0 g/dL Final  . RDW 29/52/8413 16.9* 11.5 - 15.5 % Final  . Platelets 03/01/2021 357  150 - 400 K/uL Final  . nRBC 03/01/2021 0.0  0.0 - 0.2 % Final   Performed at [facility], [address], [place], [place] [address]  . Color, Urine 03/01/2021 YELLOW  YELLOW Final  . APPearance 03/01/2021 CLEAR  CLEAR Final  . Specific Gravity, Urine 03/01/2021 1.025  1.005 - 1.030 Final  . pH 03/01/2021 7.0  5.0 - 8.0 Final  . Glucose, UA 03/01/2021 NEGATIVE  NEGATIVE mg/dL Final  . Hgb urine dipstick 03/01/2021 NEGATIVE  NEGATIVE Final  . Bilirubin Urine 03/01/2021 NEGATIVE  NEGATIVE Final  . Ketones, ur 03/01/2021 20* NEGATIVE mg/dL Final  . Protein, ur 02/72/5366 NEGATIVE  NEGATIVE mg/dL Final  . Nitrite 44/02/4741 NEGATIVE  NEGATIVE Final  . Glori Luis 03/01/2021 NEGATIVE  NEGATIVE Final   Performed at [facility], [address], [place], [place] [address]  Admission on 02/06/2021, Discharged on 02/06/2021  Component Date Value Ref Range Status  . Lipase 02/06/2021 26  11 - 51 U/L Final   Performed at [facility], [address], [place], [place] [address]  . Sodium 02/06/2021 134* 135 - 145 mmol/L Final  . Potassium 02/06/2021 3.6  3.5 - 5.1 mmol/L Final  . Chloride 02/06/2021 100  98 - 111 mmol/L Final  . CO2 02/06/2021 25  22 - 32 mmol/L Final  . Glucose, Bld 02/06/2021 87  70 - 99 mg/dL Final   Glucose reference range applies only to samples taken after fasting for at least 8 hours.  . BUN 02/06/2021 16  6 - 20 mg/dL Final  . Creatinine, Ser 02/06/2021 0.98  0.61 - 1.24 mg/dL Final  . Calcium 33/29/5188 9.0  8.9 - 10.3 mg/dL Final  . Total Protein 02/06/2021 7.9  6.5 - 8.1 g/dL Final  . Albumin 41/66/0630 4.2  3.5 - 5.0 g/dL Final  . AST 16/12/930 42* 15 - 41 U/L Final  . ALT 02/06/2021 23  0 - 44 U/L Final  . Alkaline Phosphatase 02/06/2021  43  38 - 126 U/L Final  . Total Bilirubin  02/06/2021 1.0  0.3 - 1.2 mg/dL Final  . GFR, Estimated 02/06/2021 >60  >60 mL/min Final   Comment: (NOTE) Calculated using the CKD-EPI Creatinine Equation (2021)   . Anion gap 02/06/2021 9  5 - 15 Final   Performed at [facility], [address], [place], [place] [address]  . WBC 02/06/2021 6.6  4.0 - 10.5 K/uL Final  . RBC 02/06/2021 5.16  4.22 - 5.81 MIL/uL Final  . Hemoglobin 02/06/2021 15.5  13.0 - 17.0 g/dL Final  . HCT 01/00/7121 47.2  39.0 - 52.0 % Final  . MCV 02/06/2021 91.5  80.0 - 100.0 fL Final  . MCH 02/06/2021 30.0  26.0 - 34.0 pg Final  . MCHC 02/06/2021 32.8  30.0 - 36.0 g/dL Final  . RDW 97/58/8325 16.1* 11.5 - 15.5 % Final  . Platelets 02/06/2021 413* 150 - 400 K/uL Final  . nRBC 02/06/2021 0.0  0.0 - 0.2 % Final   Performed at [facility], [address] [place], [place] [address]  . Color, Urine 02/06/2021 STRAW* YELLOW Final  . APPearance 02/06/2021 CLEAR  CLEAR Final  . Specific Gravity, Urine 02/06/2021 1.005  1.005 - 1.030 Final  . pH 02/06/2021 6.0  5.0 - 8.0 Final  . Glucose, UA 02/06/2021 NEGATIVE  NEGATIVE mg/dL Final  . Hgb urine dipstick 02/06/2021 SMALL* NEGATIVE Final  . Bilirubin Urine 02/06/2021 NEGATIVE  NEGATIVE Final  . Ketones, ur 02/06/2021 NEGATIVE  NEGATIVE mg/dL Final  . Protein, ur 41/58/3094 NEGATIVE  NEGATIVE mg/dL Final  . Nitrite 07/68/0881 NEGATIVE  NEGATIVE Final  . Leukocytes,Ua 02/06/2021 NEGATIVE  NEGATIVE Final  . WBC, UA 02/06/2021 0-5  0 - 5 WBC/hpf Final  . Bacteria, UA 02/06/2021 NONE SEEN  NONE SEEN Final  . Squamous Epithelial / LPF 02/06/2021 0-5  0 - 5 Final   Performed at [facility], [address], [place], [place] [address]  . SARS Coronavirus 2 by RT PCR 02/06/2021 NEGATIVE  NEGATIVE Final   Comment: (NOTE) SARS-CoV-2 target nucleic acids are NOT DETECTED.  The SARS-CoV-2 RNA is generally detectable in upper respiratory specimens during the acute phase of infection. The lowest concentration of SARS-CoV-2 viral  copies this assay can detect is 138 copies/mL. A negative result does not preclude SARS-Cov-2 infection and should not be used as the sole basis for treatment or other patient management decisions. A negative result may occur with  improper specimen collection/handling, submission of specimen other than nasopharyngeal swab, presence of viral mutation(s) within the areas targeted by this assay, and inadequate number of viral copies(<138 copies/mL). A negative result must be combined with clinical observations, patient history, and epidemiological information. The expected result is Negative.  Fact Sheet for Patients:  BloggerCourse.com  Fact Sheet for Healthcare Providers:  SeriousBroker.it  This test is no                          t yet approved or cleared by the [place] FDA and  has been authorized for detection and/or diagnosis of SARS-CoV-2 by FDA under an Emergency Use Authorization (EUA). This EUA will remain  in effect (meaning this test can be used) for the duration of the COVID-19 declaration under Section 564(b)(1) of the Act, 21 U.S.C.section 360bbb-3(b)(1), unless the authorization is terminated  or revoked sooner.      . Influenza A by PCR 02/06/2021 NEGATIVE  NEGATIVE Final  .  Influenza B by PCR 02/06/2021 NEGATIVE  NEGATIVE Final   Comment: (NOTE) The Xpert Xpress SARS-CoV-2/FLU/RSV plus assay is intended as an aid in the diagnosis of influenza from Nasopharyngeal swab specimens and should not be used as a sole basis for treatment. Nasal washings and aspirates are unacceptable for Xpert Xpress SARS-CoV-2/FLU/RSV testing.  Fact Sheet for Patients: BloggerCourse.comhttps://www.fda.gov/media/152166/download  Fact Sheet for Healthcare Providers: SeriousBroker.ithttps://www.fda.gov/media/152162/download  This test is not yet approved or cleared by the [place] FDA and has been authorized for detection and/or diagnosis of SARS-CoV-2  by FDA under an Emergency Use Authorization (EUA). This EUA will remain in effect (meaning this test can be used) for the duration of the COVID-19 declaration under Section 564(b)(1) of the Act, 21 U.S.C. section 360bbb-3(b)(1), unless the authorization is terminated or revoked.  Performed at [facility], [address] [place], [place] [id number]   Admission on 12/31/2020, Discharged on 01/01/2021  Component Date Value Ref Range Status  . SARS Coronavirus 2 Ag 12/31/2020 Negative  Negative Preliminary  Admission on 12/30/2020, Discharged on 12/30/2020  Component Date Value Ref Range Status  . Sodium 12/30/2020 134* 135 - 145 mmol/L Final  . Potassium 12/30/2020 3.7  3.5 - 5.1 mmol/L Final  . Chloride 12/30/2020 101  98 - 111 mmol/L Final  . CO2 12/30/2020 21* 22 - 32 mmol/L Final  . Glucose, Bld 12/30/2020 81  70 - 99 mg/dL Final   Glucose reference range applies only to samples taken after fasting for at least 8 hours.  . BUN 12/30/2020 15  6 - 20 mg/dL Final  . Creatinine, Ser 12/30/2020 1.18  0.61 - 1.24 mg/dL Final  . Calcium 47/82/956201/07/2021 8.7* 8.9 - 10.3 mg/dL Final  . Total Protein 12/30/2020 7.3  6.5 - 8.1 g/dL Final  . Albumin 13/08/657801/07/2021 4.2  3.5 - 5.0 g/dL Final  . AST 46/96/295201/07/2021 67* 15 - 41 U/L Final  . ALT 12/30/2020 30  0 - 44 U/L Final  . Alkaline Phosphatase 12/30/2020 37* 38 - 126 U/L Final  . Total Bilirubin 12/30/2020 0.6  0.3 - 1.2 mg/dL Final  . GFR, Estimated 12/30/2020 >60  >60 mL/min Final   Comment: (NOTE) Calculated using the CKD-EPI Creatinine Equation (2021)   . Anion gap 12/30/2020 12  5 - 15 Final   Performed at [facility], [address] [place], [place] [address]  . Alcohol, Ethyl (B) 12/30/2020 17* <10 mg/dL Final   Comment: (NOTE) Lowest detectable limit for serum alcohol is 10 mg/dL.  For medical purposes only. Performed at [facility], [address] [place], [place] [address]   . WBC 12/30/2020 4.4  4.0 - 10.5 K/uL Final  . RBC 12/30/2020  4.85  4.22 - 5.81 MIL/uL Final  . Hemoglobin 12/30/2020 15.0  13.0 - 17.0 g/dL Final  . HCT 27/25/366401/07/2021 44.4  39.0 - 52.0 % Final  . MCV 12/30/2020 91.5  80.0 - 100.0 fL Final  . MCH 12/30/2020 30.9  26.0 - 34.0 pg Final  . MCHC 12/30/2020 33.8  30.0 - 36.0 g/dL Final  . RDW 40/34/742501/07/2021 14.9  11.5 - 15.5 % Final  . Platelets 12/30/2020 314  150 - 400 K/uL Final  . nRBC 12/30/2020 0.0  0.0 - 0.2 % Final   Performed at [facility], [address] [place], [place] [address]  . Opiates 12/30/2020 NONE DETECTED  NONE DETECTED Final  . Cocaine 12/30/2020 POSITIVE* NONE DETECTED Final  . Benzodiazepines 12/30/2020 NONE DETECTED  NONE DETECTED Final  .  Amphetamines 12/30/2020 NONE DETECTED  NONE DETECTED Final  . Tetrahydrocannabinol 12/30/2020 NONE DETECTED  NONE DETECTED Final  . Barbiturates 12/30/2020 NONE DETECTED  NONE DETECTED Final   Comment: (NOTE) DRUG SCREEN FOR MEDICAL PURPOSES ONLY.  IF CONFIRMATION IS NEEDED FOR ANY PURPOSE, NOTIFY LAB WITHIN 5 DAYS.  LOWEST DETECTABLE LIMITS FOR URINE DRUG SCREEN Drug Class                     Cutoff (ng/mL) Amphetamine and metabolites    1000 Barbiturate and metabolites    200 Benzodiazepine                 200 Tricyclics and metabolites     300 Opiates and metabolites        300 Cocaine and metabolites        300 THC                            50 Performed at [facility], [address] [place], [place] [address]   . Troponin I (High Sensitivity) 12/30/2020 7  <18 ng/L Final   Comment: (NOTE) Elevated high sensitivity troponin I (hsTnI) values and significant  changes across serial measurements may suggest ACS but many other  chronic and acute conditions are known to elevate hsTnI results.  Refer to the "Links" section for chest pain algorithms and additional  guidance. Performed at [facility], [address], [place], [place] [address]   Admission on 12/12/2020, Discharged on 12/12/2020  Component Date Value Ref Range Status  .  Glucose-Capillary 12/12/2020 87  70 - 99 mg/dL Final   Glucose reference range applies only to samples taken after fasting for at least 8 hours.  Admission on 11/20/2020, Discharged on 11/21/2020  Component Date Value Ref Range Status  . Sodium 11/20/2020 139  135 - 145 mmol/L Final  . Potassium 11/20/2020 3.5  3.5 - 5.1 mmol/L Final  . Chloride 11/20/2020 105  98 - 111 mmol/L Final  . CO2 11/20/2020 24  22 - 32 mmol/L Final  . Glucose, Bld 11/20/2020 105* 70 - 99 mg/dL Final   Glucose reference range applies only to samples taken after fasting for at least 8 hours.  . BUN 11/20/2020 23* 6 - 20 mg/dL Final  . Creatinine, Ser 11/20/2020 1.04  0.61 - 1.24 mg/dL Final  . Calcium 25/95/6387 9.1  8.9 - 10.3 mg/dL Final  . GFR, Estimated 11/20/2020 >60  >60 mL/min Final   Comment: (NOTE) Calculated using the CKD-EPI Creatinine Equation (2021)   . Anion gap 11/20/2020 10  5 - 15 Final   Performed at [facility], [address] [place], [place] [address]  . WBC 11/20/2020 6.0  4.0 - 10.5 K/uL Final  . RBC 11/20/2020 4.75  4.22 - 5.81 MIL/uL Final  . Hemoglobin 11/20/2020 14.7  13.0 - 17.0 g/dL Final  . HCT 29/51/8841 44.7  39.0 - 52.0 % Final  . MCV 11/20/2020 94.1  80.0 - 100.0 fL Final  . MCH 11/20/2020 30.9  26.0 - 34.0 pg Final  . MCHC 11/20/2020 32.9  30.0 - 36.0 g/dL Final  . RDW 66/05/3015 14.6  11.5 - 15.5 % Final  . Platelets 11/20/2020 289  150 - 400 K/uL Final  . nRBC 11/20/2020 0.0  0.0 - 0.2 % Final   Performed at [facility], [address] [place], [place] [address]  . Troponin I (High Sensitivity) 11/20/2020 7  <18  ng/L Final   Comment: (NOTE) Elevated high sensitivity troponin I (hsTnI) values and significant  changes across serial measurements may suggest ACS but many other  chronic and acute conditions are known to elevate hsTnI results.  Refer to the "Links" section for chest pain algorithms and additional  guidance. Performed at [facility], [address]  [place], [place] [address]   . Glucose-Capillary 11/21/2020 83  70 - 99 mg/dL Final   Glucose reference range applies only to samples taken after fasting for at least 8 hours.  . Glucose-Capillary 11/20/2020 94  70 - 99 mg/dL Final   Glucose reference range applies only to samples taken after fasting for at least 8 hours.  . Troponin I (High Sensitivity) 11/20/2020 7  <18 ng/L Final   Comment: (NOTE) Elevated high sensitivity troponin I (hsTnI) values and significant  changes across serial measurements may suggest ACS but many other  chronic and acute conditions are known to elevate hsTnI results.  Refer to the "Links" section for chest pain algorithms and additional  guidance. Performed at [facility], [address] [place], [place] [address]   . Opiates 11/21/2020 NONE DETECTED  NONE DETECTED Final  . Cocaine 11/21/2020 POSITIVE* NONE DETECTED Final  . Benzodiazepines 11/21/2020 POSITIVE* NONE DETECTED Final  . Amphetamines 11/21/2020 NONE DETECTED  NONE DETECTED Final  . Tetrahydrocannabinol 11/21/2020 NONE DETECTED  NONE DETECTED Final  . Barbiturates 11/21/2020 NONE DETECTED  NONE DETECTED Final   Comment: (NOTE) DRUG SCREEN FOR MEDICAL PURPOSES ONLY.  IF CONFIRMATION IS NEEDED FOR ANY PURPOSE, NOTIFY LAB WITHIN 5 DAYS.  LOWEST DETECTABLE LIMITS FOR URINE DRUG SCREEN Drug Class                     Cutoff (ng/mL) Amphetamine and metabolites    1000 Barbiturate and metabolites    200 Benzodiazepine                 200 Tricyclics and metabolites     300 Opiates and metabolites        300 Cocaine and metabolites        300 THC                            50 Performed at [facility], [address] [place], [place] [address]   Admission on 11/11/2020, Discharged on 11/11/2020  Component Date Value Ref Range Status  . SARS Coronavirus 2 Ag 11/11/2020 Negative  Negative Final  . SARS Coronavirus 2 Ag 11/11/2020 NEGATIVE  NEGATIVE Final   Comment: (NOTE) SARS-CoV-2 antigen NOT  DETECTED.   Negative results are presumptive.  Negative results do not preclude SARS-CoV-2 infection and should not be used as the sole basis for treatment or other patient management decisions, including infection  control decisions, particularly in the presence of clinical signs and  symptoms consistent with COVID-19, or in those who have been in contact with the virus.  Negative results must be combined with clinical observations, patient history, and epidemiological information. The expected result is Negative.  Fact Sheet for Patients: https://sanders-williams.net/  Fact Sheet for Healthcare Providers: https://martinez.com/   This test is not yet approved or cleared by the [place] FDA and  has been authorized for detection and/or diagnosis of SARS-CoV-2 by FDA under an Emergency Use Authorization (EUA).  This EUA will remain in effect (meaning this test can be used) for the duration of  the C  OVID-19 declaration under Section 564(b)(1) of the Act, 21 U.S.C. section 360bbb-3(b)(1), unless the authorization is terminated or revoked sooner.    Admission on 11/11/2020, Discharged on 11/11/2020  Component Date Value Ref Range Status  . Sodium 11/11/2020 136  135 - 145 mmol/L Final  . Potassium 11/11/2020 3.7  3.5 - 5.1 mmol/L Final  . Chloride 11/11/2020 101  98 - 111 mmol/L Final  . CO2 11/11/2020 25  22 - 32 mmol/L Final  . Glucose, Bld 11/11/2020 108* 70 - 99 mg/dL Final   Glucose reference range applies only to samples taken after fasting for at least 8 hours.  . BUN 11/11/2020 13  6 - 20 mg/dL Final  . Creatinine, Ser 11/11/2020 0.79  0.61 - 1.24 mg/dL Final  . Calcium 16/09/9603 8.7* 8.9 - 10.3 mg/dL Final  . Total Protein 11/11/2020 7.3  6.5 - 8.1 g/dL Final  . Albumin 54/08/8118 4.2  3.5 - 5.0 g/dL Final  . AST 14/78/2956 35  15 - 41 U/L Final  . ALT 11/11/2020 22  0 - 44 U/L Final  . Alkaline Phosphatase  11/11/2020 30* 38 - 126 U/L Final  . Total Bilirubin 11/11/2020 0.8  0.3 - 1.2 mg/dL Final  . GFR, Estimated 11/11/2020 >60  >60 mL/min Final   Comment: (NOTE) Calculated using the CKD-EPI Creatinine Equation (2021)   . Anion gap 11/11/2020 10  5 - 15 Final   Performed at [facility], [address] [place], [place] [address]  . Alcohol, Ethyl (B) 11/11/2020 <10  <10 mg/dL Final   Comment: (NOTE) Lowest detectable limit for serum alcohol is 10 mg/dL.  For medical purposes only. Performed at [facility], [address] [place], [place] [address]   . WBC 11/11/2020 6.4  4.0 - 10.5 K/uL Final  . RBC 11/11/2020 4.88  4.22 - 5.81 MIL/uL Final  . Hemoglobin 11/11/2020 15.4  13.0 - 17.0 g/dL Final  . HCT 69/62/9528 45.8  39.0 - 52.0 % Final  . MCV 11/11/2020 93.9  80.0 - 100.0 fL Final  . MCH 11/11/2020 31.6  26.0 - 34.0 pg Final  . MCHC 11/11/2020 33.6  30.0 - 36.0 g/dL Final  . RDW 41/32/4401 14.6  11.5 - 15.5 % Final  . Platelets 11/11/2020 388  150 - 400 K/uL Final  . nRBC 11/11/2020 0.0  0.0 - 0.2 % Final   Performed at [facility], [address], [place], [place] [address]  . Opiates 11/11/2020 NONE DETECTED  NONE DETECTED Final  . Cocaine 11/11/2020 POSITIVE* NONE DETECTED Final  . Benzodiazepines 11/11/2020 NONE DETECTED  NONE DETECTED Final  . Amphetamines 11/11/2020 NONE DETECTED  NONE DETECTED Final  . Tetrahydrocannabinol 11/11/2020 POSITIVE* NONE DETECTED Final  . Barbiturates 11/11/2020 NONE DETECTED  NONE DETECTED Final   Comment: (NOTE) DRUG SCREEN FOR MEDICAL PURPOSES ONLY.  IF CONFIRMATION IS NEEDED FOR ANY PURPOSE, NOTIFY LAB WITHIN 5 DAYS.  LOWEST DETECTABLE LIMITS FOR URINE DRUG SCREEN Drug Class                     Cutoff (ng/mL) Amphetamine and metabolites    1000 Barbiturate and metabolites    200 Benzodiazepine                 200 Tricyclics and metabolites     300 Opiates and metabolites         300 Cocaine and metabolites        300  THC                            50 Performed at [facility], [address] [place], [place] [address]     Allergies: Bee venom and Penicillins  PTA Medications: (Not in a hospital admission)   Medical Decision Making  Admission orders placed Patient was medically cleared in the ED Labs reviewed  Patient has history of QT prolongation. QTC 510 on 03/01/21. Placed order for EKG.   Monitor CIWA  Monitor CBGs AC and HS  Continue home medications  Current Facility-Administered Medications  Medication Dose Route Frequency Provider Last Rate Last Admin  . acetaminophen (TYLENOL) tablet 650 mg  650 mg Oral Q6H PRN Jackelyn Poling, NP      . alum & mag hydroxide-simeth (MAALOX/MYLANTA) 200-200-20 MG/5ML suspension 30 mL  30 mL Oral Q4H PRN Nira Conn A, NP      . amLODipine (NORVASC) tablet 5 mg  5 mg Oral Daily Nira Conn A, NP       And  . benazepril (LOTENSIN) tablet 10 mg  10 mg Oral Daily Nira Conn A, NP      . gabapentin (NEURONTIN) capsule 300 mg  300 mg Oral TID Nira Conn A, NP      . glipiZIDE (GLUCOTROL) tablet 5 mg  5 mg Oral QAC breakfast Nira Conn A, NP      . hydrOXYzine (ATARAX/VISTARIL) tablet 25 mg  25 mg Oral Q6H PRN Jackelyn Poling, NP      . icosapent Ethyl (VASCEPA) 1 g capsule 2 g  2 g Oral BID Nira Conn A, NP      . insulin aspart (novoLOG) injection 0-6 Units  0-6 Units Subcutaneous TID WC Nira Conn A, NP      . insulin glargine (LANTUS) injection 20 Units  20 Units Subcutaneous Daily Nira Conn A, NP      . loperamide (IMODIUM) capsule 2-4 mg  2-4 mg Oral PRN Jackelyn Poling, NP      . loratadine (CLARITIN) tablet 10 mg  10 mg Oral Daily Nira Conn A, NP      . magnesium hydroxide (MILK OF MAGNESIA) suspension 30 mL  30 mL Oral Daily PRN Nira Conn A, NP      . metFORMIN (GLUCOPHAGE) tablet 1,000 mg  1,000 mg Oral BID WC Nira Conn A, NP      . ondansetron (ZOFRAN-ODT)  disintegrating tablet 4 mg  4 mg Oral Q6H PRN Nira Conn A, NP      . pantoprazole (PROTONIX) EC tablet 40 mg  40 mg Oral Daily Nira Conn A, NP      . tamsulosin (FLOMAX) capsule 0.4 mg  0.4 mg Oral Daily Nira Conn A, NP      . traZODone (DESYREL) tablet 50 mg  50 mg Oral QHS PRN Jackelyn Poling, NP       Current Outpatient Medications  Medication Sig Dispense Refill  . amLODipine-benazepril (LOTREL) 5-10 MG capsule Take 1 capsule by mouth daily.    Marland Kitchen glipiZIDE (GLUCOTROL) 5 MG tablet Take 5 mg by mouth daily.    Marland Kitchen LANTUS SOLOSTAR 100 UNIT/ML Solostar Pen Inject 20 Units into the skin daily.    Marland Kitchen levocetirizine (XYZAL) 5 MG tablet Take 5 mg by mouth daily.    . metFORMIN (GLUCOPHAGE) 1000 MG tablet Take 1 tablet by mouth 2 (two) times daily.    Marland Kitchen  mirtazapine (REMERON) 15 MG tablet Take 15 mg by mouth at bedtime.    Marland Kitchen omeprazole (PRILOSEC) 40 MG capsule Take 1 capsule by mouth daily.    Marland Kitchen VASCEPA 1 g capsule Take 2 g by mouth 2 (two) times daily.    Marland Kitchen amLODipine (NORVASC) 5 MG tablet Take 1 tablet (5 mg total) by mouth daily. 30 tablet 0  . citalopram (CELEXA) 20 MG tablet Take 1 tablet (20 mg total) by mouth daily. 30 tablet 0  . FLONASE ALLERGY RELIEF 50 MCG/ACT nasal spray Place 1 spray into both nostrils daily as needed.    . gabapentin (NEURONTIN) 300 MG capsule Take 1 capsule (300 mg total) by mouth 3 (three) times daily. 90 capsule 0  . gabapentin (NEURONTIN) 400 MG capsule TAKE 1 CAPSULE BY MOUTH THREE TIMES A DAY. 90 capsule 0  . lisinopril (ZESTRIL) 10 MG tablet Take 1 tablet (10 mg total) by mouth daily. 30 tablet 0  . metFORMIN (GLUCOPHAGE) 850 MG tablet Take 1 tablet (850 mg total) by mouth daily with breakfast. 30 tablet 0  . oxyCODONE (OXY IR/ROXICODONE) 5 MG immediate release tablet TAKE ONE TABLET EVERY 6 HOURS AS NEEDED FOR 7 DAYS 28 tablet 0  . pantoprazole (PROTONIX) 40 MG tablet Take 1 tablet (40 mg total) by mouth daily. 30 tablet 0  . sucralfate (CARAFATE) 1 g  tablet Take 1 tablet (1 g total) by mouth 4 (four) times daily -  with meals and at bedtime. 30 tablet 1  . tamsulosin (FLOMAX) 0.4 MG CAPS capsule Take 1 capsule (0.4 mg total) by mouth daily. 30 capsule 0  . traZODone (DESYREL) 50 MG tablet Take 1 tablet (50 mg total) by mouth at bedtime as needed for sleep. 30 tablet 0       Recommendations  Based on my evaluation the patient does not appear to have an emergency medical condition.  Patient would like assistance with placement at a long term residential substance abuse treatment facility.    Rozetta Nunnery, NP 05/04/21  6:32 AM

## 2021-05-04 NOTE — ED Notes (Signed)
Pt discharged in no acute distress. Resources given along with samples of RX's. Pt transported to [facility] for pending assessment at facility. Denied SI/HI/AVH. Safety maintained.

## 2021-05-04 NOTE — ED Notes (Signed)
Pt resting with eyes closed in no acute distress. RR even and unlabored. Safety maintained.

## 2021-05-04 NOTE — Discharge Instructions (Signed)

## 2021-05-04 NOTE — ED Notes (Addendum)
Pt admitted to continuous assessment due to SI and requesting drug and ETOH detox. Pt A&O x4, calm and cooperative. Pt tolerated skin assessment well. Pt ambulated independently to unit. Oriented to unit/staff. No signs of acute distress noted. Will continue to monitor for safety.

## 2021-05-04 NOTE — Progress Notes (Addendum)
At request of provider, [facility], CSW called for for bed placement into a detox facility.  Patient has medicare.  Facilities that accept medicare for detox [facility], [facility]: [facility].    CS[facility] and [facility].  B[facility] are full at [facility] and [facility].  [facility]    CSW will call [facility] for detox and residential treatment.  Addendum: Patient refused treatment program option in [place].  Provider notified. Patient presented with options. Patient agreed to go to [facility]    Denni France, LCSW, [place] Social Worker  [facility]

## 2021-05-04 NOTE — ED Notes (Signed)
EDP at bedside  

## 2021-05-04 NOTE — ED Provider Notes (Signed)
[facility] EMERGENCY DEPARTMENT Provider Note  CSN: [id number] Arrival date & time: 05/03/21 1149  Chief Complaint(s) Suicidal  HPI [name] [name]. is a 60 y.o. male with a past medical history listed below including polysubstance abuse here for passive suicidal ideation.  Patient reports prior history of suicide attempt.  He is requesting detox from drugs and alcohol. Admits to being depressed. Denies any HI or AVH.  Denies any active plan.  Denies any other physical complaints.  HPI  Past Medical History Past Medical History:  Diagnosis Date  . Acid reflux   . Alcohol abuse    6-8 cans of beer daily; hx of incarceration for DWI  . Arthritis   . Bipolar disorder ([place])   . Bronchitis   . Chronic back pain   . Chronic neck pain   . Chronic pain   . Diabetes mellitus without complication ([place])   . Fracture of lower leg   . Gout   . Hyperlipidemia   . Hypertension   . Left arm pain    chronic  . Migraine   . Pancreatitis    March 2013  . Pseudocyst of pancreas 02/28/2012  . Substance abuse [facility])    Patient Active Problem List   Diagnosis Date Noted  . Type 2 diabetes mellitus with diabetic neuropathy, without long-term current use of insulin ([place]) 09/21/2020  . Septic arthritis ([place]) 08/09/2019  . S/P right knee arthroscopy 07/29/2019 08/03/2019  . Acute lateral meniscus tear of right knee   . Acute medial meniscus tear of right knee   . Substance use disorder 03/16/2019  . History of diabetes mellitus 10/10/2016  . Avascular necrosis of hip, left ([place]) 10/04/2016  . Status post left hip replacement 10/04/2016  . Rash and nonspecific skin eruption 08/12/2013  . Cholelithiases 08/04/2013  . Subcutaneous nodule 07/21/2013  . Acute alcoholic pancreatitis 41/74/0814  . Syncope 04/11/2013  . Prolonged Q-T interval on ECG 04/11/2013  . Cocaine abuse ([place]) 03/13/2013    Class: Chronic  . At risk for adverse drug event 02/14/2013  . DDD (degenerative disc  disease), lumbar 02/01/2013  . Dyspepsia 12/01/2012  . BPH (benign prostatic hyperplasia) 10/07/2012  . Allergic rhinitis 10/03/2012  . Transaminitis 10/02/2012  . Chronic pain syndrome 10/02/2012  . Essential hypertension, benign 07/01/2012  . Tobacco user 07/01/2012  . Hepatic steatosis 03/06/2012  . ED (erectile dysfunction) 03/06/2012  . Pseudocyst of pancreas 02/28/2012  . Alcohol abuse 02/25/2012  . GERD (gastroesophageal reflux disease) 02/23/2012  . Bipolar 1 disorder ([place]) 02/23/2012  . Hypertriglyceridemia 12/05/2011   Home Medication(s) Prior to Admission medications   Medication Sig Start Date End Date Taking? Authorizing Provider  amLODipine (NORVASC) 5 MG tablet Take 1 tablet (5 mg total) by mouth daily. 01/01/21   Money, Lowry Ram, FNP  citalopram (CELEXA) 20 MG tablet Take 1 tablet (20 mg total) by mouth daily. 01/01/21   Money, Lowry Ram, FNP  gabapentin (NEURONTIN) 300 MG capsule Take 1 capsule (300 mg total) by mouth 3 (three) times daily. 01/01/21   Money, Lowry Ram, FNP  gabapentin (NEURONTIN) 400 MG capsule TAKE 1 CAPSULE BY MOUTH THREE TIMES A DAY. 04/09/21   Carole Civil, MD  lisinopril (ZESTRIL) 10 MG tablet Take 1 tablet (10 mg total) by mouth daily. 01/01/21   Money, Lowry Ram, FNP  metFORMIN (GLUCOPHAGE) 850 MG tablet Take 1 tablet (850 mg total) by mouth daily with breakfast. 01/01/21   Money, Lowry Ram, FNP  oxyCODONE (OXY  IR/ROXICODONE) 5 MG immediate release tablet TAKE ONE TABLET EVERY 6 HOURS AS NEEDED FOR 7 DAYS 04/23/21   Carole Civil, MD  pantoprazole (PROTONIX) 40 MG tablet Take 1 tablet (40 mg total) by mouth daily. 01/02/21   Money, Lowry Ram, FNP  sucralfate (CARAFATE) 1 g tablet Take 1 tablet (1 g total) by mouth 4 (four) times daily -  with meals and at bedtime. 02/06/21   Alfredia Client, PA-C  tamsulosin (FLOMAX) 0.4 MG CAPS capsule Take 1 capsule (0.4 mg total) by mouth daily. 01/01/21   Money, Lowry Ram, FNP  traZODone (DESYREL) 50 MG tablet Take  1 tablet (50 mg total) by mouth at bedtime as needed for sleep. 01/01/21   Money, Lowry Ram, FNP                                                                                                                                    Past Surgical History Past Surgical History:  Procedure Laterality Date  . CIRCUMCISION  03/17/2012   Procedure: CIRCUMCISION ADULT;  Surgeon: Marissa Nestle, MD;  Location: [facility];  Service: Urology;  Laterality: N/A;  . COLONOSCOPY WITH PROPOFOL  12/24/2012   MWU:XLKG lesion as described above status post biopsy; otherwise normal rectum/Sigmoid diverticulosis/Sigmoid polyps -- resected as described above. anal lesion, benign. colon polyp, hyperplastic  . ESOPHAGOGASTRODUODENOSCOPY (EGD) WITH PROPOFOL  12/24/2012   MWN:[id number] erythema and erosions of uncertain significance status post gastric biopsy (reactive gastropathy NO h.pylori)  . KNEE ARTHROSCOPY Right 08/09/2019   Procedure: arthroscopic incision and drainage snovectomy;  Surgeon: Paralee Cancel, MD;  Location: [facility];  Service: Orthopedics;  Laterality: Right;  . KNEE ARTHROSCOPY WITH MEDIAL MENISECTOMY Right 07/29/2019   Procedure: RIGHT KNEE ARTHROSCOPY WITH MEDIAL MENISCECTOMY AND LATERAL MENISCECTOMY;  Surgeon: Carole Civil, MD;  Location: [facility];  Service: Orthopedics;  Laterality: Right;  . NOSE SURGERY     broken nose  . TOTAL HIP ARTHROPLASTY Left 10/04/2016   Procedure: LEFT TOTAL HIP ARTHROPLASTY ANTERIOR APPROACH;  Surgeon: Mcarthur Rossetti, MD;  Location: [facility];  Service: Orthopedics;  Laterality: Left;   Family History Family History  Problem Relation Age of Onset  . Diabetes Father   . Healthy Mother   . Anesthesia problems Neg Hx   . Hypotension Neg Hx   . Malignant hyperthermia Neg Hx   . Pseudochol deficiency Neg Hx   . Colon cancer Neg Hx   . Heart disease Neg Hx   . Stroke Neg Hx   . Cancer Neg Hx     Social History Social History   Tobacco Use  . Smoking status:  Current Every Day Smoker    Packs/day: 0.50    Years: 25.00    Pack years: 12.50    Types: Cigarettes  . Smokeless tobacco: Former Systems developer    Types: Chew    Quit date: 12/23/2014  Vaping Use  . Vaping  Use: Never used  Substance Use Topics  . Alcohol use: Yes    Comment: heavily  . Drug use: Yes    Types: Cocaine, Marijuana   Allergies Bee venom and Penicillins  Review of Systems Review of Systems All other systems are reviewed and are negative for acute change except as noted in the HPI  Physical Exam Vital Signs  I have reviewed the triage vital signs BP 120/85 (BP Location: Left Arm)   Pulse 98   Temp 97.8 F (36.6 C) (Oral)   Resp 17   Ht 5\' 10"  (1.778 m)   Wt 86.2 kg   SpO2 97%   BMI 27.26 kg/m   Physical Exam Vitals reviewed.  Constitutional:      General: He is not in acute distress.    Appearance: He is well-developed. He is not diaphoretic.  HENT:     Head: Normocephalic and atraumatic.     Jaw: No trismus.     Right Ear: External ear normal.     Left Ear: External ear normal.     Nose: Nose normal.  Eyes:     General: No scleral icterus.    Conjunctiva/sclera: Conjunctivae normal.  Neck:     Trachea: Phonation normal.  Cardiovascular:     Rate and Rhythm: Normal rate and regular rhythm.  Pulmonary:     Effort: Pulmonary effort is normal. No respiratory distress.     Breath sounds: No stridor.  Abdominal:     General: There is no distension.  Musculoskeletal:        General: Normal range of motion.     Cervical back: Normal range of motion.  Neurological:     Mental Status: He is alert and oriented to person, place, and time.  Psychiatric:        Behavior: Behavior normal.     ED Results and Treatments Labs (all labs ordered are listed, but only abnormal results are displayed) Labs Reviewed  COMPREHENSIVE METABOLIC PANEL - Abnormal; Notable for the following components:      Result Value   Sodium 132 (*)    Alkaline Phosphatase 35 (*)     All other components within normal limits  ETHANOL - Abnormal; Notable for the following components:   Alcohol, Ethyl (B) 10 (*)    All other components within normal limits  SALICYLATE LEVEL - Abnormal; Notable for the following components:   Salicylate Lvl <7.0 (*)    All other components within normal limits  ACETAMINOPHEN LEVEL - Abnormal; Notable for the following components:   Acetaminophen (Tylenol), Serum <10 (*)    All other components within normal limits  RAPID URINE DRUG SCREEN, HOSP PERFORMED - Abnormal; Notable for the following components:   Cocaine POSITIVE (*)    All other components within normal limits  RESP PANEL BY RT-PCR (FLU A&B, COVID) ARPGX2  CBC  EKG  EKG Interpretation  Date/Time:    Ventricular Rate:    PR Interval:    QRS Duration:   QT Interval:    QTC Calculation:   R Axis:     Text Interpretation:        Radiology No results found.  Pertinent labs & imaging results that were available during my care of the patient were reviewed by me and considered in my medical decision making (see chart for details).  Medications Ordered in ED Medications - No data to display                                                                                                                                  Procedures Procedures  (including critical care time)  Medical Decision Making / ED Course I have reviewed the nursing notes for this encounter and the patient's prior records (if available in EHR or on provided paperwork).   Aloysuis Ribaudo. was evaluated in Emergency Department on 05/04/2021 for the symptoms described in the history of present illness. He was evaluated in the context of the global COVID-19 pandemic, which necessitated consideration that the patient might be at risk for infection with the SARS-CoV-2 virus that causes  COVID-19. Institutional protocols and algorithms that pertain to the evaluation of patients at risk for COVID-19 are in a state of rapid change based on information released by regulatory bodies including the CDC and federal and state organizations. These policies and algorithms were followed during the patient's care in the ED.  Patient was seen and first look process and screening labs obtained. Patient medically cleared. Behavioral health evaluated and would like to observe him [place]. Transferred.     Final Clinical Impression(s) / ED Diagnoses Final diagnoses:  Suicidal ideation      This chart was dictated using voice recognition software.  Despite best efforts to proofread,  errors can occur which can change the documentation meaning.   Fatima Blank, MD 05/04/21 [phone]

## 2021-05-04 NOTE — ED Notes (Addendum)
Pt cooperative this am. Denies needs or concerns. Denies SI/HI/AVH. Request assistance with rehab placement. Pt spoke with NP on request for placement. Informed pt to notify staff with any needs or concerns.Will continue to monitor for safety.

## 2021-05-04 NOTE — ED Notes (Signed)
TWO Lockers Locker 28: white and black suitcase with white patient belongings bags Locker 47: black suitcase with single white patients belongings bag tied to handle

## 2021-05-04 NOTE — ED Provider Notes (Signed)
FBC/OBS ASAP Discharge Summary  Date and Time: 05/04/2021 10:55 AM  Name: [name] [name].  MRN:  [id number]   Discharge Diagnoses:  Final diagnoses:  Substance induced mood disorder ([place])  Cocaine abuse ([place])  Alcohol use disorder, severe, dependence ([place])    Subjective: Patient reports that he is doing better today.  Patient denies any suicidal or homicidal ideations and denies any hallucinations.  Patient states that he wants to get into substance abuse treatment program to get off of drugs and alcohol.  Patient reports that he has been to other substance abuse treatment programs in the past.  Social work was consulted and patient had refused to go to [facility] and [facility] [place] at first however once patient discovered that other facilities were full or he was declined to be accepted to [facility] [place] as they had open beds.  Stay Summary: Patient is a 60 year old male with a history of cocaine abuse, depression and anxiety who presented to [facility] reporting suicidal ideations requesting detox and mental health treatment.  Patient was transported to the [facility] for continuous assessment.  Patient was restarted on his home medications and remained overnight for observation.  Today the patient had reported that he still wants to go to substance abuse treatment and denied any suicidal or homicidal ideations.  Patient was offered various detox and residential facilities however patient had refused some of them and the other ones today were for no bed availability for this patient.  [facility] except [facility] [place] [facility] treatment.  Patient was provided with 30-day prescriptions and 7-day samples of his medications and was transported [facility] [place].  Total Time spent with patient: 30 minutes  Past Psychiatric History: polysubstance abuse, mood disorder, numerous ED visits Past Medical History:  Past Medical  History:  Diagnosis Date  . Acid reflux   . Alcohol abuse    6-8 cans of beer daily; hx of incarceration for DWI  . Arthritis   . Bipolar disorder ([place])   . Bronchitis   . Chronic back pain   . Chronic neck pain   . Chronic pain   . Diabetes mellitus without complication ([place])   . Fracture of lower leg   . Gout   . Hyperlipidemia   . Hypertension   . Left arm pain    chronic  . Migraine   . Pancreatitis    March 2013  . Pseudocyst of pancreas 02/28/2012  . Substance abuse [facility])     Past Surgical History:  Procedure Laterality Date  . CIRCUMCISION  03/17/2012   Procedure: CIRCUMCISION ADULT;  Surgeon: Marissa Nestle, MD;  Location: [facility];  Service: Urology;  Laterality: N/A;  . COLONOSCOPY WITH PROPOFOL  12/24/2012   HKV:QQVZ lesion as described above status post biopsy; otherwise normal rectum/Sigmoid diverticulosis/Sigmoid polyps -- resected as described above. anal lesion, benign. colon polyp, hyperplastic  . ESOPHAGOGASTRODUODENOSCOPY (EGD) WITH PROPOFOL  12/24/2012   DGL:OVFIEPP erythema and erosions of uncertain significance status post gastric biopsy (reactive gastropathy NO h.pylori)  . KNEE ARTHROSCOPY Right 08/09/2019   Procedure: arthroscopic incision and drainage snovectomy;  Surgeon: Paralee Cancel, MD;  Location: [facility];  Service: Orthopedics;  Laterality: Right;  . KNEE ARTHROSCOPY WITH MEDIAL MENISECTOMY Right 07/29/2019   Procedure: RIGHT KNEE ARTHROSCOPY WITH MEDIAL MENISCECTOMY AND LATERAL MENISCECTOMY;  Surgeon: Carole Civil, MD;  Location: [facility];  Service: Orthopedics;  Laterality: Right;  . NOSE SURGERY     broken nose  . TOTAL HIP ARTHROPLASTY Left 10/04/2016   Procedure: LEFT TOTAL HIP ARTHROPLASTY ANTERIOR APPROACH;  Surgeon: Mcarthur Rossetti, MD;  Location: [facility];  Service: Orthopedics;  Laterality: Left;   Family History:  Family History  Problem Relation Age of Onset  . Diabetes Father   . Healthy Mother   . Anesthesia problems Neg Hx    . Hypotension Neg Hx   . Malignant hyperthermia Neg Hx   . Pseudochol deficiency Neg Hx   . Colon cancer Neg Hx   . Heart disease Neg Hx   . Stroke Neg Hx   . Cancer Neg Hx    Family Psychiatric History: None reported Social History:  Social History   Substance and Sexual Activity  Alcohol Use Yes   Comment: heavily     Social History   Substance and Sexual Activity  Drug Use Yes  . Types: Cocaine, Marijuana    Social History   Socioeconomic History  . Marital status: Single    Spouse name: Not on file  . Number of children: Not on file  . Years of education: Not on file  . Highest education level: Not on file  Occupational History  . Occupation: unemployed  Tobacco Use  . Smoking status: Current Every Day Smoker    Packs/day: 0.50    Years: 25.00    Pack years: 12.50    Types: Cigarettes  . Smokeless tobacco: Former Systems developer    Types: Chew    Quit date: 12/23/2014  Vaping Use  . Vaping Use: Never used  Substance and Sexual Activity  . Alcohol use: Yes    Comment: heavily  . Drug use: Yes    Types: Cocaine, Marijuana  . Sexual activity: Not Currently  Other Topics Concern  . Not on file  Social History Narrative  . Not on file   Social Determinants of Health   Financial Resource Strain: Not on file  Food Insecurity: Not on file  Transportation Needs: Not on file  Physical Activity: Not on file  Stress: Not on file  Social Connections: Not on file   SDOH:  SDOH Screenings   Alcohol Screen: Not on file  Depression (PHQ2-9): Medium Risk  . PHQ-2 Score: 16  Financial Resource Strain: Not on file  Food Insecurity: Not on file  Housing: Not on file  Physical Activity: Not on file  Social Connections: Not on file  Stress: Not on file  Tobacco Use: High Risk  . Smoking Tobacco Use: Current Every Day Smoker  . Smokeless Tobacco Use: Former Soil scientist Needs: Not on file    Has this patient used any form of tobacco in the last 30 days?  (Cigarettes, Smokeless Tobacco, Cigars, and/or Pipes) A prescription for an FDA-approved tobacco cessation medication was offered at discharge and the patient refused  Current Medications:  Current Facility-Administered Medications  Medication Dose Route Frequency Provider Last Rate Last Admin  . acetaminophen (TYLENOL) tablet 650 mg  650 mg Oral Q6H PRN Lindon Romp A, NP      . alum & mag hydroxide-simeth (MAALOX/MYLANTA) 200-200-20 MG/5ML suspension 30 mL  30 mL Oral Q4H PRN Lindon Romp A, NP      . amLODipine (NORVASC) tablet 5 mg  5 mg Oral Daily Lindon Romp A, NP   5 mg at 05/04/21 [id number]   And  . benazepril (LOTENSIN) tablet 10 mg  10 mg Oral Daily Lindon Romp  A, NP   10 mg at 05/04/21 0917  . citalopram (CELEXA) tablet 20 mg  20 mg Oral Daily Kyra Laffey, Lowry Ram, FNP   20 mg at 05/04/21 [id number]  . gabapentin (NEURONTIN) capsule 300 mg  300 mg Oral TID Lindon Romp A, NP      . gabapentin (NEURONTIN) capsule 300 mg  300 mg Oral TID Sonda Coppens, Lowry Ram, FNP   300 mg at 05/04/21 0918  . glipiZIDE (GLUCOTROL) tablet 5 mg  5 mg Oral QAC breakfast Lindon Romp A, NP   5 mg at 05/04/21 [id number]  . hydrOXYzine (ATARAX/VISTARIL) tablet 25 mg  25 mg Oral Q6H PRN Rozetta Nunnery, NP      . icosapent Ethyl (VASCEPA) 1 g capsule 2 g  2 g Oral BID Lindon Romp A, NP      . insulin aspart (novoLOG) injection 0-6 Units  0-6 Units Subcutaneous TID WC Lindon Romp A, NP   2 Units at 05/04/21 0000  . insulin glargine (LANTUS) injection 20 Units  20 Units Subcutaneous Daily Lindon Romp A, NP   20 Units at 05/04/21 [id number]  . loperamide (IMODIUM) capsule 2-4 mg  2-4 mg Oral PRN Lindon Romp A, NP      . loratadine (CLARITIN) tablet 10 mg  10 mg Oral Daily Lindon Romp A, NP   10 mg at 05/04/21 0918  . magnesium hydroxide (MILK OF MAGNESIA) suspension 30 mL  30 mL Oral Daily PRN Lindon Romp A, NP      . metFORMIN (GLUCOPHAGE) tablet 1,000 mg  1,000 mg Oral BID WC Lindon Romp A, NP   1,000 mg at 05/04/21 0924  . ondansetron  (ZOFRAN-ODT) disintegrating tablet 4 mg  4 mg Oral Q6H PRN Lindon Romp A, NP      . pantoprazole (PROTONIX) EC tablet 40 mg  40 mg Oral Daily Lindon Romp A, NP   40 mg at 05/04/21 0918  . tamsulosin (FLOMAX) capsule 0.4 mg  0.4 mg Oral Daily Lindon Romp A, NP   0.4 mg at 05/04/21 0927  . traZODone (DESYREL) tablet 50 mg  50 mg Oral QHS PRN Rozetta Nunnery, NP       Current Outpatient Medications  Medication Sig Dispense Refill  . [START ON 05/05/2021] amLODipine (NORVASC) 5 MG tablet Take 1 tablet (5 mg total) by mouth daily. 30 tablet 0  . [START ON 05/05/2021] benazepril (LOTENSIN) 10 MG tablet Take 1 tablet (10 mg total) by mouth daily. 30 tablet 0  . citalopram (CELEXA) 20 MG tablet Take 1 tablet (20 mg total) by mouth daily. 30 tablet 0  . gabapentin (NEURONTIN) 300 MG capsule Take 1 capsule (300 mg total) by mouth 3 (three) times daily. 90 capsule 0  . glipiZIDE (GLUCOTROL) 5 MG tablet Take 1 tablet (5 mg total) by mouth daily. 30 tablet 0  . [START ON 05/05/2021] insulin glargine (LANTUS) 100 UNIT/ML injection Inject 0.2 mLs (20 Units total) into the skin daily. 10 mL 11  . [START ON 05/05/2021] loratadine (CLARITIN) 10 MG tablet Take 1 tablet (10 mg total) by mouth daily. 30 tablet 0  . metFORMIN (GLUCOPHAGE) 500 MG tablet Take 2 tablets (1,000 mg total) by mouth 2 (two) times daily with a meal. 60 tablet 0  . [START ON 05/05/2021] pantoprazole (PROTONIX) 40 MG tablet Take 1 tablet (40 mg total) by mouth daily. 30 tablet 0  . tamsulosin (FLOMAX) 0.4 MG CAPS capsule Take 1 capsule (0.4 mg total) by mouth daily. 30 capsule  0  . traZODone (DESYREL) 50 MG tablet Take 1 tablet (50 mg total) by mouth at bedtime as needed for sleep. 30 tablet 0  . VASCEPA 1 g capsule Take 2 capsules (2 g total) by mouth 2 (two) times daily. 120 capsule 0    PTA Medications: (Not in a hospital admission)   Musculoskeletal  Strength & Muscle Tone: within normal limits Gait & Station: normal Patient leans:  N/A  Psychiatric Specialty Exam  Presentation  General Appearance: Appropriate for Environment; Casual  Eye Contact:Good  Speech:Clear and Coherent; Normal Rate  Speech Volume:Normal  Handedness:Right   Mood and Affect  Mood:Depressed  Affect:Appropriate; Congruent   Thought Process  Thought Processes:Coherent  Descriptions of Associations:Intact  Orientation:Full (Time, Place and Person)  Thought Content:WDL  Diagnosis of Schizophrenia or Schizoaffective disorder in past: No  Duration of Psychotic Symptoms: Less than six months   Hallucinations:Hallucinations: None  Ideas of Reference:None  Suicidal Thoughts:Suicidal Thoughts: No SI Passive Intent and/or Plan: Without Intent; Without Plan  Homicidal Thoughts:Homicidal Thoughts: No   Sensorium  Memory:Immediate Good; Recent Good; Remote Good  Judgment:Fair  Insight:Fair   Executive Functions  Concentration:Good  Attention Span:Good  Recall:Good  Fund of Knowledge:Good  Language:Good   Psychomotor Activity  Psychomotor Activity:Psychomotor Activity: Normal   Assets  Assets:Communication Skills; Desire for Improvement; Financial Resources/Insurance; Web designer; Social Support; Physical Health   Sleep  Sleep:Sleep: Good   Nutritional Assessment (For OBS and FBC admissions only) Has the patient had a weight loss or gain of 10 pounds or more in the last 3 months?: No Has the patient had a decrease in food intake/or appetite?: No Does the patient have dental problems?: No Does the patient have eating habits or behaviors that may be indicators of an eating disorder including binging or inducing vomiting?: No Has the patient recently lost weight without trying?: No Has the patient been eating poorly because of a decreased appetite?: No Malnutrition Screening Tool Score: 0    Physical Exam  Physical Exam Vitals and nursing note reviewed.  Constitutional:      Appearance: He  is well-developed.  HENT:     Head: Normocephalic.  Eyes:     Pupils: Pupils are equal, round, and reactive to light.  Cardiovascular:     Rate and Rhythm: Normal rate.  Pulmonary:     Effort: Pulmonary effort is normal.  Musculoskeletal:        General: Normal range of motion.  Neurological:     Mental Status: He is alert and oriented to person, place, and time.    Review of Systems  Constitutional: Negative.   HENT: Negative.   Eyes: Negative.   Respiratory: Negative.   Cardiovascular: Negative.   Gastrointestinal: Negative.   Genitourinary: Negative.   Musculoskeletal: Negative.   Skin: Negative.   Neurological: Negative.   Endo/Heme/Allergies: Negative.   Psychiatric/Behavioral: Positive for substance abuse.   Blood pressure 124/79, pulse 73, temperature 98.6 F (37 C), temperature source Oral, resp. rate 18, SpO2 100 %. There is no height or weight on file to calculate BMI.  Demographic Factors:  Male, Low socioeconomic status, Living alone and Unemployed  Loss Factors: NA  Historical Factors: NA  Risk Reduction Factors:   Positive social support and Positive therapeutic relationship  Continued Clinical Symptoms:  Alcohol/Substance Abuse/Dependencies Previous Psychiatric Diagnoses and Treatments  Cognitive Features That Contribute To Risk:  None    Suicide Risk:  Minimal: No identifiable suicidal ideation.  Patients presenting with no risk factors but  with morbid ruminations; may be classified as minimal risk based on the severity of the depressive symptoms  Plan Of Care/Follow-up recommendations:  Continue activity as tolerated. Continue diet as recommended by your PCP. Ensure to keep all appointments with outpatient providers.  Disposition: Discharge to [facility] in [place], [place] 05/04/2021, 10:55 AM

## 2021-05-07 NOTE — BH Assessment (Signed)
Care Management - Follow Up [facility] Discharges   Writer attempted to make contact with patient today and was unsuccessful.  Writer was able to leave a HIPPA compliant voice message and will await callback.   

## 2021-05-07 NOTE — Telephone Encounter (Signed)
I have called patient and left multiple messages for him to call the office.  Still have not heard back from him.

## 2021-05-09 ENCOUNTER — Encounter (HOSPITAL_COMMUNITY): Payer: Self-pay

## 2021-05-09 ENCOUNTER — Other Ambulatory Visit: Payer: Self-pay

## 2021-05-09 ENCOUNTER — Emergency Department (HOSPITAL_COMMUNITY)
Admission: EM | Admit: 2021-05-09 | Discharge: 2021-05-10 | Disposition: A | Discharge status: Home / Self Care | Payer: Medicare Other | Attending: Emergency Medicine | Admitting: Emergency Medicine

## 2021-05-09 DIAGNOSIS — Z96642 Presence of left artificial hip joint: Secondary | ICD-10-CM | POA: Insufficient documentation

## 2021-05-09 DIAGNOSIS — F17210 Nicotine dependence, cigarettes, uncomplicated: Secondary | ICD-10-CM | POA: Diagnosis not present

## 2021-05-09 DIAGNOSIS — F1910 Other psychoactive substance abuse, uncomplicated: Secondary | ICD-10-CM

## 2021-05-09 DIAGNOSIS — I10 Essential (primary) hypertension: Secondary | ICD-10-CM | POA: Diagnosis not present

## 2021-05-09 DIAGNOSIS — Z794 Long term (current) use of insulin: Secondary | ICD-10-CM | POA: Diagnosis not present

## 2021-05-09 DIAGNOSIS — Y900 Blood alcohol level of less than 20 mg/100 ml: Secondary | ICD-10-CM | POA: Diagnosis not present

## 2021-05-09 DIAGNOSIS — Z7984 Long term (current) use of oral hypoglycemic drugs: Secondary | ICD-10-CM | POA: Insufficient documentation

## 2021-05-09 DIAGNOSIS — Z79899 Other long term (current) drug therapy: Secondary | ICD-10-CM | POA: Diagnosis not present

## 2021-05-09 DIAGNOSIS — E1140 Type 2 diabetes mellitus with diabetic neuropathy, unspecified: Secondary | ICD-10-CM | POA: Diagnosis not present

## 2021-05-09 DIAGNOSIS — F1020 Alcohol dependence, uncomplicated: Secondary | ICD-10-CM | POA: Diagnosis not present

## 2021-05-09 DIAGNOSIS — F1410 Cocaine abuse, uncomplicated: Secondary | ICD-10-CM | POA: Insufficient documentation

## 2021-05-09 DIAGNOSIS — Z5900 Homelessness unspecified: Secondary | ICD-10-CM | POA: Diagnosis not present

## 2021-05-09 DIAGNOSIS — E1165 Type 2 diabetes mellitus with hyperglycemia: Secondary | ICD-10-CM | POA: Diagnosis present

## 2021-05-09 LAB — CBC WITH DIFFERENTIAL/PLATELET
Abs Immature Granulocytes: 0.02 10*3/uL (ref 0.00–0.07)
Basophils Absolute: 0 10*3/uL (ref 0.0–0.1)
Basophils Relative: 1 %
Eosinophils Absolute: 0.2 10*3/uL (ref 0.0–0.5)
Eosinophils Relative: 4 %
HCT: 40.7 % (ref 39.0–52.0)
Hemoglobin: 13.2 g/dL (ref 13.0–17.0)
Immature Granulocytes: 1 %
Lymphocytes Relative: 42 %
Lymphs Abs: 1.8 10*3/uL (ref 0.7–4.0)
MCH: 30.3 pg (ref 26.0–34.0)
MCHC: 32.4 g/dL (ref 30.0–36.0)
MCV: 93.6 fL (ref 80.0–100.0)
Monocytes Absolute: 0.4 10*3/uL (ref 0.1–1.0)
Monocytes Relative: 10 %
Neutro Abs: 1.8 10*3/uL (ref 1.7–7.7)
Neutrophils Relative %: 42 %
Platelets: 118 10*3/uL — ABNORMAL LOW (ref 150–400)
RBC: 4.35 MIL/uL (ref 4.22–5.81)
RDW: 15.2 % (ref 11.5–15.5)
WBC: 4.2 10*3/uL (ref 4.0–10.5)
nRBC: 0 % (ref 0.0–0.2)

## 2021-05-09 LAB — COMPREHENSIVE METABOLIC PANEL
ALT: 17 U/L (ref 0–44)
AST: 23 U/L (ref 15–41)
Albumin: 4 g/dL (ref 3.5–5.0)
Alkaline Phosphatase: 28 U/L — ABNORMAL LOW (ref 38–126)
Anion gap: 8 (ref 5–15)
BUN: 11 mg/dL (ref 6–20)
CO2: 26 mmol/L (ref 22–32)
Calcium: 9.1 mg/dL (ref 8.9–10.3)
Chloride: 100 mmol/L (ref 98–111)
Creatinine, Ser: 0.92 mg/dL (ref 0.61–1.24)
GFR, Estimated: >60 mL/min (ref >60)
Glucose, Bld: 190 mg/dL — ABNORMAL HIGH (ref 70–99)
Potassium: 3.7 mmol/L (ref 3.5–5.1)
Sodium: 134 mmol/L — ABNORMAL LOW (ref 135–145)
Total Bilirubin: 0.5 mg/dL (ref 0.3–1.2)
Total Protein: 7.4 g/dL (ref 6.5–8.1)

## 2021-05-09 LAB — URINALYSIS, ROUTINE W REFLEX MICROSCOPIC
Bilirubin Urine: NEGATIVE
Glucose, UA: NEGATIVE mg/dL
Hgb urine dipstick: NEGATIVE
Ketones, ur: NEGATIVE mg/dL
Leukocytes,Ua: NEGATIVE
Nitrite: NEGATIVE
Protein, ur: NEGATIVE mg/dL
Specific Gravity, Urine: 1.011 (ref 1.005–1.030)
pH: 7 (ref 5.0–8.0)

## 2021-05-09 LAB — CBG MONITORING, ED: Glucose-Capillary: 76 mg/dL (ref 70–99)

## 2021-05-09 LAB — RAPID URINE DRUG SCREEN, HOSP PERFORMED
Amphetamines: NOT DETECTED
Barbiturates: NOT DETECTED
Benzodiazepines: POSITIVE — AB
Cocaine: NOT DETECTED
Opiates: NOT DETECTED
Tetrahydrocannabinol: NOT DETECTED

## 2021-05-09 LAB — ETHANOL: Alcohol, Ethyl (B): <10 mg/dL (ref <10)

## 2021-05-09 MED ORDER — METFORMIN HCL 500 MG PO TABS
1000.0000 mg | ORAL_TABLET | Freq: Two times a day (BID) | ORAL | Status: DC
  Administered 2021-05-10: 1000 mg via ORAL
  Filled 2021-05-09: qty 2

## 2021-05-09 MED ORDER — GABAPENTIN 300 MG PO CAPS
300.0000 mg | ORAL_CAPSULE | Freq: Three times a day (TID) | ORAL | Status: DC
  Administered 2021-05-09 – 2021-05-10 (×2): 300 mg via ORAL
  Filled 2021-05-09 (×2): qty 1

## 2021-05-09 MED ORDER — OLANZAPINE 5 MG PO TABS
20.0000 mg | ORAL_TABLET | Freq: Every day | ORAL | Status: DC
  Administered 2021-05-09: 20 mg via ORAL
  Filled 2021-05-09: qty 4

## 2021-05-09 MED ORDER — AMLODIPINE BESYLATE 5 MG PO TABS
5.0000 mg | ORAL_TABLET | Freq: Every day | ORAL | Status: DC
  Administered 2021-05-10: 5 mg via ORAL
  Filled 2021-05-09: qty 1

## 2021-05-09 MED ORDER — PANTOPRAZOLE SODIUM 40 MG PO TBEC
40.0000 mg | DELAYED_RELEASE_TABLET | Freq: Every day | ORAL | Status: DC
  Administered 2021-05-10: 40 mg via ORAL
  Filled 2021-05-09: qty 1

## 2021-05-09 MED ORDER — INSULIN GLARGINE 100 UNIT/ML ~~LOC~~ SOLN
20.0000 [IU] | Freq: Every day | SUBCUTANEOUS | Status: DC
  Filled 2021-05-09 (×2): qty 0.2

## 2021-05-09 MED ORDER — CITALOPRAM HYDROBROMIDE 20 MG PO TABS
20.0000 mg | ORAL_TABLET | Freq: Every day | ORAL | Status: DC
  Administered 2021-05-10: 20 mg via ORAL
  Filled 2021-05-09: qty 1

## 2021-05-09 MED ORDER — LISINOPRIL 10 MG PO TABS
10.0000 mg | ORAL_TABLET | Freq: Every day | ORAL | Status: DC
  Administered 2021-05-10: 10 mg via ORAL
  Filled 2021-05-09: qty 1

## 2021-05-09 MED ORDER — BENAZEPRIL HCL 10 MG PO TABS
10.0000 mg | ORAL_TABLET | Freq: Every day | ORAL | Status: DC
  Administered 2021-05-10: 10 mg via ORAL
  Filled 2021-05-09: qty 1

## 2021-05-09 MED ORDER — TAMSULOSIN HCL 0.4 MG PO CAPS
0.4000 mg | ORAL_CAPSULE | Freq: Every day | ORAL | Status: DC
  Administered 2021-05-10: 0.4 mg via ORAL
  Filled 2021-05-09: qty 1

## 2021-05-09 MED ORDER — GLIPIZIDE 5 MG PO TABS
5.0000 mg | ORAL_TABLET | Freq: Every day | ORAL | Status: DC
  Administered 2021-05-10: 5 mg via ORAL
  Filled 2021-05-09: qty 1

## 2021-05-09 NOTE — ED Provider Notes (Signed)
Emergency Medicine Provider Triage Evaluation Note  [name] [name]. , a 60 y.o. male  was evaluated in triage.  Pt here for evaluation of problems with his blood sugar.  He was recently admitted at [facility] for alcohol and drug detox.  States during his rehabilitation his blood sugars have been up and down with sugars as low as 100 and as high as 300s and he left the facility today.  Last etoh or drug use was 05/02/21.  States he has changed his diet and avoiding sugars, but continues to have problems regulating his medication.  Denies recent illness, syncope, dizziness, fever, chills, chest pain, abdominal pain, vomiting or diarrhea.  Review of Systems  Positive: Abnormal blood sugars Negative: Fever, chills, chest pain, shortness of breath, abdominal pain  Physical Exam  BP (!) 135/93 (BP Location: Right Arm)   Pulse 68   Temp 97.8 F (36.6 C) (Oral)   Resp 18   Ht 5\' 10"  (1.778 m)   Wt 91.6 kg   SpO2 98%   BMI 28.98 kg/m  Gen:   Awake, no distress   Resp:  Normal effort, lungs clear to auscultation bilaterally MSK:   Moves extremities without difficulty  Other:    Medical Decision Making  Medically screening exam initiated at 4:10 PM.  Appropriate orders placed.  [name] was informed that the remainder of the evaluation will be completed by another provider, this initial triage assessment does not replace that evaluation, and the importance of remaining in the ED until their evaluation is complete.  Patient here for evaluation of abnormal blood sugars.  He was recently admitted to [facility] for detox from alcohol and drugs.  Well-appearing on exam without focal deficit.  He will need further evaluation in emergency department.  Patient agreeable to plan.   Kem Parkinson, PA-C 05/09/21 1633    Daleen Bo, MD 05/11/21 [phone]

## 2021-05-09 NOTE — ED Triage Notes (Signed)
Pt to er, pt states that he was in detox for drugs and etoh rehab, states that he has been having a problem with his blood sugar since getting into rehab, states that they changed his diet but he is still having sugar problems

## 2021-05-09 NOTE — ED Provider Notes (Signed)
[facility] EMERGENCY DEPARTMENT Provider Note   CSN: [id number] Arrival date & time: 05/09/21  1353     History Chief Complaint  Patient presents with  . Hyperglycemia    [name] [name]. is a 60 y.o. male.  HPI Patient states he was at [facility] today, when he was discharged because his blood sugars were low and high.  He was treated at [facility] earlier this month, subsequently sent to the behavioral health urgent care, then sent to [facility], in [place], on 05/04/2021.  He states that personnel from [facility] in [place], drove him to [place] [facility] and dropped him off.  Patient states that they were treating his diabetes with oral medication and long-term insulin at night.  He states he usually takes sliding scale insulin during the day as well as long-acting insulin at night.  He states he wants to be admitted to another detox unit so he can "graduate," then go to a long-term facility for detox.  He states that if he goes out on the street he will start using drugs again and relapse.  He states that he does not get admitted to a detox center he will probably commit suicide.  I explained to him that this was manipulative behavior, and that I would not take that as a serious gesture.  Patient agreed to have his CBG rechecked.  It had been done earlier during the screening process.  Patient denies other acute problems.    Past Medical History:  Diagnosis Date  . Acid reflux   . Alcohol abuse    6-8 cans of beer daily; hx of incarceration for DWI  . Arthritis   . Bipolar disorder ([place])   . Bronchitis   . Chronic back pain   . Chronic neck pain   . Chronic pain   . Diabetes mellitus without complication ([place])   . Fracture of lower leg   . Gout   . Hyperlipidemia   . Hypertension   . Left arm pain    chronic  . Migraine   . Pancreatitis    March 2013  . Pseudocyst of pancreas 02/28/2012  . Substance abuse [facility])     Patient Active Problem List   Diagnosis Date Noted  . Substance  induced mood disorder ([place]) 01/19/2021  . Alcohol use disorder, severe, dependence ([place]) 01/05/2021  . Cocaine use disorder, moderate, dependence ([place]) 01/05/2021  . Depression 01/05/2021  . Type 2 diabetes mellitus with diabetic neuropathy, without long-term current use of insulin ([place]) 09/21/2020  . Septic arthritis ([place]) 08/09/2019  . S/P right knee arthroscopy 07/29/2019 08/03/2019  . Acute lateral meniscus tear of right knee   . Acute medial meniscus tear of right knee   . Substance use disorder 03/16/2019  . History of diabetes mellitus 10/10/2016  . Avascular necrosis of hip, left ([place]) 10/04/2016  . Status post left hip replacement 10/04/2016  . Rash and nonspecific skin eruption 08/12/2013  . Cholelithiases 08/04/2013  . Subcutaneous nodule 07/21/2013  . Acute alcoholic pancreatitis [id number]  . Syncope 04/11/2013  . Prolonged Q-T interval on ECG 04/11/2013  . Cocaine abuse ([place]) 03/13/2013    Class: Chronic  . At risk for adverse drug event 02/14/2013  . DDD (degenerative disc disease), lumbar 02/01/2013  . Dyspepsia 12/01/2012  . BPH (benign prostatic hyperplasia) 10/07/2012  . Allergic rhinitis 10/03/2012  . Transaminitis 10/02/2012  . Chronic pain syndrome 10/02/2012  . Essential hypertension, benign 07/01/2012  . Tobacco user 07/01/2012  . Hepatic steatosis 03/06/2012  .  ED (erectile dysfunction) 03/06/2012  . Pseudocyst of pancreas 02/28/2012  . Alcohol abuse 02/25/2012  . GERD (gastroesophageal reflux disease) 02/23/2012  . Bipolar 1 disorder ([place]) 02/23/2012  . Hypertriglyceridemia 12/05/2011    Past Surgical History:  Procedure Laterality Date  . CIRCUMCISION  03/17/2012   Procedure: CIRCUMCISION ADULT;  Surgeon: Marissa Nestle, MD;  Location: [facility];  Service: Urology;  Laterality: N/A;  . COLONOSCOPY WITH PROPOFOL  12/24/2012   SWF:UXNA lesion as described above status post biopsy; otherwise normal rectum/Sigmoid diverticulosis/Sigmoid polyps -- resected  as described above. anal lesion, benign. colon polyp, hyperplastic  . ESOPHAGOGASTRODUODENOSCOPY (EGD) WITH PROPOFOL  12/24/2012   TFT:DDUKGUR erythema and erosions of uncertain significance status post gastric biopsy (reactive gastropathy NO h.pylori)  . KNEE ARTHROSCOPY Right 08/09/2019   Procedure: arthroscopic incision and drainage snovectomy;  Surgeon: Paralee Cancel, MD;  Location: [facility];  Service: Orthopedics;  Laterality: Right;  . KNEE ARTHROSCOPY WITH MEDIAL MENISECTOMY Right 07/29/2019   Procedure: RIGHT KNEE ARTHROSCOPY WITH MEDIAL MENISCECTOMY AND LATERAL MENISCECTOMY;  Surgeon: Carole Civil, MD;  Location: [facility];  Service: Orthopedics;  Laterality: Right;  . NOSE SURGERY     broken nose  . TOTAL HIP ARTHROPLASTY Left 10/04/2016   Procedure: LEFT TOTAL HIP ARTHROPLASTY ANTERIOR APPROACH;  Surgeon: Mcarthur Rossetti, MD;  Location: [facility];  Service: Orthopedics;  Laterality: Left;       Family History  Problem Relation Age of Onset  . Diabetes Father   . Healthy Mother   . Anesthesia problems Neg Hx   . Hypotension Neg Hx   . Malignant hyperthermia Neg Hx   . Pseudochol deficiency Neg Hx   . Colon cancer Neg Hx   . Heart disease Neg Hx   . Stroke Neg Hx   . Cancer Neg Hx     Social History   Tobacco Use  . Smoking status: Current Every Day Smoker    Packs/day: 0.50    Years: 25.00    Pack years: 12.50    Types: Cigarettes  . Smokeless tobacco: Former Systems developer    Types: Chew    Quit date: 12/23/2014  Vaping Use  . Vaping Use: Never used  Substance Use Topics  . Alcohol use: Yes    Comment: heavily  . Drug use: Yes    Types: Cocaine, Marijuana    Home Medications Prior to Admission medications   Medication Sig Start Date End Date Taking? Authorizing Provider  amLODipine (NORVASC) 5 MG tablet Take 1 tablet (5 mg total) by mouth daily. 05/05/21  Yes Money, Lowry Ram, FNP  benazepril (LOTENSIN) 10 MG tablet Take 1 tablet (10 mg total) by mouth daily.  05/05/21  Yes Money, Lowry Ram, FNP  citalopram (CELEXA) 20 MG tablet Take 1 tablet (20 mg total) by mouth daily. 05/04/21  Yes Money, Lowry Ram, FNP  fluticasone (FLONASE) 50 MCG/ACT nasal spray Place 1 spray into both nostrils daily.   Yes [provider]  gabapentin (NEURONTIN) 300 MG capsule Take 1 capsule (300 mg total) by mouth 3 (three) times daily. 05/04/21  Yes Money, Lowry Ram, FNP  gabapentin (NEURONTIN) 400 MG capsule Take 400 mg by mouth 3 (three) times daily.   Yes [provider]  glipiZIDE (GLUCOTROL) 5 MG tablet Take 1 tablet (5 mg total) by mouth daily. 05/04/21  Yes Money, Lowry Ram, FNP  guaiFENesin (MUCINEX) 600 MG 12 hr tablet Take 600 mg by mouth daily.   Yes [provider]  insulin glargine (  LANTUS) 100 UNIT/ML injection Inject 0.2 mLs (20 Units total) into the skin daily. Patient taking differently: Inject 2-8 Units into the skin daily as needed. Sliding scale 2-8 05/05/21  Yes Money, Lowry Ram, FNP  insulin lispro (HUMALOG) 100 UNIT/ML injection Inject 20 Units into the skin every evening.   Yes [provider]  lisinopril (ZESTRIL) 10 MG tablet Take 10 mg by mouth daily.   Yes [provider]  loratadine (CLARITIN) 10 MG tablet Take 1 tablet (10 mg total) by mouth daily. 05/05/21  Yes Money, Lowry Ram, FNP  metFORMIN (GLUCOPHAGE) 500 MG tablet Take 2 tablets (1,000 mg total) by mouth 2 (two) times daily with a meal. 05/04/21  Yes Money, Lowry Ram, FNP  OLANZapine (ZYPREXA) 20 MG tablet Take 20 mg by mouth in the morning and at bedtime.   Yes [provider]  omeprazole (PRILOSEC) 40 MG capsule Take 40 mg by mouth daily.   Yes [provider]  ondansetron (ZOFRAN-ODT) 4 MG disintegrating tablet Take 4 mg by mouth every 8 (eight) hours as needed for nausea or vomiting.   Yes [provider]  oxymetazoline (AFRIN) 0.05 % nasal spray Place 1 spray into both nostrils 2 (two) times daily.   Yes [provider]   pantoprazole (PROTONIX) 40 MG tablet Take 1 tablet (40 mg total) by mouth daily. 05/05/21  Yes Money, Lowry Ram, FNP  sucralfate (CARAFATE) 1 g tablet Take 1 g by mouth 4 (four) times daily.   Yes [provider]  tamsulosin (FLOMAX) 0.4 MG CAPS capsule Take 1 capsule (0.4 mg total) by mouth daily. 05/04/21  Yes Money, Lowry Ram, FNP  traZODone (DESYREL) 50 MG tablet Take 1 tablet (50 mg total) by mouth at bedtime as needed for sleep. 05/04/21  Yes Money, Lowry Ram, FNP  VASCEPA 1 g capsule Take 2 capsules (2 g total) by mouth 2 (two) times daily. Patient not taking: No sig reported 05/04/21   Money, Lowry Ram, FNP    Allergies    Bee venom and Penicillins  Review of Systems   Review of Systems  All other systems reviewed and are negative.   Physical Exam Updated Vital Signs BP (!) 123/91   Pulse 63   Temp 97.9 F (36.6 C) (Oral)   Resp 18   Ht 5\' 10"  (1.778 m)   Wt 91.6 kg   SpO2 95%   BMI 28.98 kg/m   Physical Exam Vitals and nursing note reviewed.  Constitutional:      General: He is not in acute distress.    Appearance: He is well-developed. He is not ill-appearing.     Comments: Patient arrived to the ED with 2 large suitcases.  He has 2 bags of medication.  1 in which he was discharged from [facility] with, and the other his chronic medications.  HENT:     Head: Normocephalic and atraumatic.     Right Ear: External ear normal.     Left Ear: External ear normal.  Eyes:     Conjunctiva/sclera: Conjunctivae normal.     Pupils: Pupils are equal, round, and reactive to light.  Neck:     Trachea: Phonation normal.  Cardiovascular:     Rate and Rhythm: Normal rate.  Pulmonary:     Effort: Pulmonary effort is normal.  Abdominal:     General: There is no distension.     Tenderness: There is no abdominal tenderness.  Musculoskeletal:        General: Normal  range of motion.     Cervical back: Normal range of motion and neck supple.  Skin:    General: Skin is warm and  dry.  Neurological:     Mental Status: He is alert and oriented to person, place, and time.     Cranial Nerves: No cranial nerve deficit.     Sensory: No sensory deficit.     Motor: No abnormal muscle tone.     Coordination: Coordination normal.  Psychiatric:        Mood and Affect: Mood normal.        Behavior: Behavior normal.        Thought Content: Thought content normal.        Judgment: Judgment normal.     ED Results / Procedures / Treatments   Labs (all labs ordered are listed, but only abnormal results are displayed) Labs Reviewed  COMPREHENSIVE METABOLIC PANEL - Abnormal; Notable for the following components:      Result Value   Sodium 134 (*)    Glucose, Bld 190 (*)    Alkaline Phosphatase 28 (*)    All other components within normal limits  CBC WITH DIFFERENTIAL/PLATELET - Abnormal; Notable for the following components:   Platelets 118 (*)    All other components within normal limits  URINALYSIS, ROUTINE W REFLEX MICROSCOPIC - Abnormal; Notable for the following components:   Color, Urine STRAW (*)    All other components within normal limits  RAPID URINE DRUG SCREEN, HOSP PERFORMED - Abnormal; Notable for the following components:   Benzodiazepines POSITIVE (*)    All other components within normal limits  ETHANOL  CBG MONITORING, ED    EKG None  Radiology No results found.  Procedures Procedures   Medications Ordered in ED Medications - No data to display  ED Course  I have reviewed the triage vital signs and the nursing notes.  Pertinent labs & imaging results that were available during my care of the patient were reviewed by me and considered in my medical decision making (see chart for details).    MDM Rules/Calculators/A&P                           Patient Vitals for the past 24 hrs:  BP Temp Temp src Pulse Resp SpO2 Height Weight  05/09/21 2300 (!) 123/91 -- -- 63 18 95 % -- --  05/09/21 2200 (!) 128/96 -- -- (!) 59 19 98 % -- --   05/09/21 2130 (!) 114/93 -- -- (!) 57 19 96 % -- --  05/09/21 2103 (!) 123/92 97.9 F (36.6 C) Oral (!) 57 18 99 % -- --  05/09/21 1409 (!) 135/93 97.8 F (36.6 C) Oral 68 18 98 % 5\' 10"  (1.778 m) 91.6 kg      Medical Decision Making:  This patient is presenting for evaluation of personal concern for relapse of illegal substance use, which does not require a range of treatment options, and is not a complaint that involves a high risk of morbidity and mortality. The differential diagnoses include manipulative behavior, homelessness, polysubstance abuse. I decided to review old records, and in summary middle-aged male presenting for personal desire for detox.  He is clinically stable and does not have concerning findings on history or physical exam.  Screening labs ordered.  I did not require additional historical information from anyone.  Clinical Laboratory Tests Ordered, included CBC, Metabolic panel, Urinalysis and UDS, alcohol level. Review  indicates normal except mild elevation of glucose.   Critical Interventions-clinical evaluation, laboratory testing, observation reassess  After These Interventions, the Patient was reevaluated and was found stable from the perspective of chronic substance abuse.  Patient is apparently homeless and requesting detox.  CRITICAL CARE-no Performed by: Daleen Bo  Nursing Notes Reviewed/ Care Coordinated Applicable Imaging Reviewed Interpretation of Laboratory Data incorporated into ED treatment  Plan TTS consultation-for outpatient management     Final Clinical Impression(s) / ED Diagnoses Final diagnoses:  Polysubstance abuse [facility])  Homelessness    Rx / DC Orders ED Discharge Orders    None       Daleen Bo, MD 05/11/21 1023

## 2021-05-10 ENCOUNTER — Telehealth: Payer: Self-pay | Admitting: Orthopedic Surgery

## 2021-05-10 NOTE — BH Assessment (Signed)
DISPOSITION: Gave clinical report to Thomes Lolling, NPwho determined Pt does not meet criteria for inpatient psychiatric treatment.  Notified Tammy Briaroaks, Pa- C  and Leonette Most , RN of disposition recommendation and the sitter utilization recommendation.

## 2021-05-10 NOTE — ED Provider Notes (Signed)
Cleared by behavioral health for discharge.   Fredia Sorrow, MD 05/10/21 1241

## 2021-05-10 NOTE — Telephone Encounter (Signed)
Mr. [name] [name] called in today stating he got the letter that he has an appointment on Monday, 05/14/21.  I told him that I had sent that because I had been unable to get him by phone last week.  He then asked if he could get a prescription for pain medication from Dr. Aline Brochure.  I told him that was why I wanted to speak to him last week.  When asked at that time, Dr. Aline Brochure had said that [name] [name] needed to come in before getting any pain medications.  He was told this and understands
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
## 2021-05-10 NOTE — Discharge Instructions (Addendum)
Follow-up as per behavioral health.  Patient cleared for discharge.

## 2021-05-14 ENCOUNTER — Other Ambulatory Visit: Payer: Self-pay

## 2021-05-14 ENCOUNTER — Encounter: Payer: Self-pay | Admitting: Orthopedic Surgery

## 2021-05-14 ENCOUNTER — Ambulatory Visit (INDEPENDENT_AMBULATORY_CARE_PROVIDER_SITE_OTHER): Payer: Medicare Other | Admitting: Orthopedic Surgery

## 2021-05-14 VITALS — BP 132/87 | HR 96 | Ht 70.0 in | Wt 200.0 lb

## 2021-05-14 DIAGNOSIS — M25461 Effusion, right knee: Secondary | ICD-10-CM

## 2021-05-14 DIAGNOSIS — Z9889 Other specified postprocedural states: Secondary | ICD-10-CM

## 2021-05-14 DIAGNOSIS — G8929 Other chronic pain: Secondary | ICD-10-CM

## 2021-05-14 DIAGNOSIS — M25561 Pain in right knee: Secondary | ICD-10-CM | POA: Diagnosis not present

## 2021-05-14 NOTE — Progress Notes (Signed)
   Chief Complaint  Patient presents with  . Knee Pain    Patient reports right knee pain, and wants to discuss Right shoulder surgery in the fall.     60 year old male status post right knee arthroscopy complicated by septic arthritis back in 2020;  Presents with right knee pain chronic shoulder pain thinking of having surgery on the right shoulder this fall  However the patient has had substance abuse positive cocaine in his urine and is no longer a candidate for opioid therapy from our office  He does have a swollen right knee which we will aspirate and inject I will see him in 3 months to discuss shoulder surgery provided his substance abuse has been treated  Procedure note injection and aspiration right knee joint  Verbal consent was obtained to aspirate and inject the right knee joint   Timeout was completed to confirm the site of aspiration and injection  An 18-gauge needle was used to aspirate the knee joint from a suprapatellar lateral approach.  The medications used were 40 mg of Depo-Medrol and 1% lidocaine 3 cc  Anesthesia was provided by ethyl chloride and the skin was prepped with alcohol.  After cleaning the skin with alcohol an 18-gauge needle was used to aspirate the right knee joint.  We obtained 10  cc of fluid / clear  We follow this by injection of 40 mg of Depo-Medrol and 3 cc 1% lidocaine.  There were no complications. A sterile bandage was applied.  Encounter Diagnoses  Name Primary?  . Chronic pain of right knee Yes  . S/P right knee arthroscopy   . Effusion, right knee

## 2021-05-15 ENCOUNTER — Other Ambulatory Visit: Payer: Self-pay

## 2021-05-15 ENCOUNTER — Ambulatory Visit (HOSPITAL_COMMUNITY)
Admission: EM | Admit: 2021-05-15 | Discharge: 2021-05-15 | Disposition: A | Discharge status: Home / Self Care | Payer: Medicare Other | Attending: Psychiatry | Admitting: Psychiatry

## 2021-05-15 DIAGNOSIS — F1020 Alcohol dependence, uncomplicated: Secondary | ICD-10-CM | POA: Diagnosis present

## 2021-05-15 NOTE — Discharge Instructions (Addendum)

## 2021-05-15 NOTE — BH Assessment (Signed)
TTS triage: Patient presents to [facility] from [facility] requesting detox. This counselor informed patient this is not a detox facility. He states "Last time yall helped me get to [facility]" so is requesting evaluation. He denies SI/HI/AVH. He reports he last drank alcohol yesterday- 2 beers and 2 shots. Patient states he went to [facility] today for 28 day program but they told him he needed detox and sent him here.  Patient is routine.

## 2021-05-15 NOTE — ED Provider Notes (Signed)
Behavioral Health Urgent Care Medical Screening Exam  Patient Name: [name] [name]. MRN: [id number] Date of Evaluation: 05/15/21 Chief Complaint:   Diagnosis:  Final diagnoses:  Uncomplicated alcohol dependence ([place])    History of Present illness: [name] [name]. is a 60 y.o. male.  Patient presents voluntarily as a walk-in to the [facility].  Patient reports that he came in today because he was trying to get into detox.  Patient stated that he went to [facility] today and he was told he had to go to detox first.  Patient reports that he has been drinking daily up until yesterday.  He states he has been having 2-3 beers and shots of liquor daily.  Patient denies any suicidal or homicidal ideations and denies any hallucinations.  Patient reports that he came here because we assisted him in getting to a detox facility in the past.  Social work was consulted and they discovered that there are available beds at [facility].  Social work and Engineer, civil (consulting) are assisting with transportation to [facility].  Patient will be transported via safe transport.  Psychiatric Specialty Exam  Presentation  General Appearance:Appropriate for Environment; Casual  Eye Contact:Good  Speech:Clear and Coherent; Normal Rate  Speech Volume:Normal  Handedness:Right   Mood and Affect  Mood:Euthymic  Affect:Appropriate; Congruent   Thought Process  Thought Processes:Coherent  Descriptions of Associations:Intact  Orientation:Full (Time, Place and Person)  Thought Content:WDL  Diagnosis of Schizophrenia or Schizoaffective disorder in past: Yes  Duration of Psychotic Symptoms: Less than six months  Hallucinations:None  Ideas of Reference:None  Suicidal Thoughts:No Without Intent; Without Plan  Homicidal Thoughts:No   Sensorium  Memory:Immediate Good; Recent Good; Remote Good  Judgment:Good  Insight:Fair   Executive Functions  Concentration:Good  Attention  Span:Good  [place] of Knowledge:Good  Language:Good   Psychomotor Activity  Psychomotor Activity:Normal   Assets  Assets:Communication Skills; Desire for Improvement; Financial Resources/Insurance; Housing; Physical Health; Social Support; Transportation   Sleep  Sleep:Good  Number of hours: 6   No data recorded  Physical Exam: Physical Exam Vitals and nursing note reviewed.  Constitutional:      Appearance: He is well-developed.  HENT:     Head: Normocephalic.  Eyes:     Pupils: Pupils are equal, round, and reactive to light.  Cardiovascular:     Rate and Rhythm: Normal rate.  Pulmonary:     Effort: Pulmonary effort is normal.  Musculoskeletal:        General: Normal range of motion.  Neurological:     Mental Status: He is alert and oriented to person, place, and time.    Review of Systems  Constitutional: Negative.   HENT: Negative.   Eyes: Negative.   Respiratory: Negative.   Cardiovascular: Negative.   Gastrointestinal: Negative.   Genitourinary: Negative.   Musculoskeletal: Negative.   Skin: Negative.   Neurological: Negative.   Endo/Heme/Allergies: Negative.   Psychiatric/Behavioral: Positive for substance abuse.   Blood pressure (!) 138/95, pulse 87, temperature 98 F (36.7 C), temperature source Oral, resp. rate 18, SpO2 99 %. There is no height or weight on file to calculate BMI.  Musculoskeletal: Strength & Muscle Tone: within normal limits Gait & Station: normal Patient leans: N/A   [place] MSE Discharge Disposition for Follow up and Recommendations: Based on my evaluation the patient does not appear to have an emergency medical condition and can be discharged with resources and follow up care in outpatient services for Medication Management, Individual Therapy and  substance abuse treatment   Lewis Shock, FNP 05/15/2021, 2:15 PM

## 2021-05-16 ENCOUNTER — Emergency Department (HOSPITAL_COMMUNITY)
Admission: EM | Admit: 2021-05-16 | Discharge: 2021-05-17 | Disposition: A | Discharge status: Home / Self Care | Payer: Medicare Other | Attending: Emergency Medicine | Admitting: Emergency Medicine

## 2021-05-16 ENCOUNTER — Other Ambulatory Visit: Payer: Self-pay

## 2021-05-16 ENCOUNTER — Encounter (HOSPITAL_COMMUNITY): Payer: Self-pay

## 2021-05-16 DIAGNOSIS — E1169 Type 2 diabetes mellitus with other specified complication: Secondary | ICD-10-CM | POA: Diagnosis not present

## 2021-05-16 DIAGNOSIS — E1140 Type 2 diabetes mellitus with diabetic neuropathy, unspecified: Secondary | ICD-10-CM | POA: Insufficient documentation

## 2021-05-16 DIAGNOSIS — Z560 Unemployment, unspecified: Secondary | ICD-10-CM | POA: Insufficient documentation

## 2021-05-16 DIAGNOSIS — Z96642 Presence of left artificial hip joint: Secondary | ICD-10-CM | POA: Diagnosis not present

## 2021-05-16 DIAGNOSIS — E785 Hyperlipidemia, unspecified: Secondary | ICD-10-CM | POA: Diagnosis not present

## 2021-05-16 DIAGNOSIS — Z7984 Long term (current) use of oral hypoglycemic drugs: Secondary | ICD-10-CM | POA: Insufficient documentation

## 2021-05-16 DIAGNOSIS — F1910 Other psychoactive substance abuse, uncomplicated: Secondary | ICD-10-CM | POA: Diagnosis not present

## 2021-05-16 DIAGNOSIS — I10 Essential (primary) hypertension: Secondary | ICD-10-CM | POA: Insufficient documentation

## 2021-05-16 DIAGNOSIS — Z79899 Other long term (current) drug therapy: Secondary | ICD-10-CM | POA: Diagnosis not present

## 2021-05-16 DIAGNOSIS — Y900 Blood alcohol level of less than 20 mg/100 ml: Secondary | ICD-10-CM | POA: Diagnosis not present

## 2021-05-16 DIAGNOSIS — F10129 Alcohol abuse with intoxication, unspecified: Secondary | ICD-10-CM | POA: Diagnosis present

## 2021-05-16 DIAGNOSIS — F14129 Cocaine abuse with intoxication, unspecified: Secondary | ICD-10-CM | POA: Diagnosis not present

## 2021-05-16 DIAGNOSIS — F17210 Nicotine dependence, cigarettes, uncomplicated: Secondary | ICD-10-CM | POA: Diagnosis not present

## 2021-05-16 DIAGNOSIS — F19920 Other psychoactive substance use, unspecified with intoxication, uncomplicated: Secondary | ICD-10-CM

## 2021-05-16 DIAGNOSIS — Z880 Allergy status to penicillin: Secondary | ICD-10-CM | POA: Diagnosis not present

## 2021-05-16 DIAGNOSIS — F17220 Nicotine dependence, chewing tobacco, uncomplicated: Secondary | ICD-10-CM | POA: Insufficient documentation

## 2021-05-16 DIAGNOSIS — Z794 Long term (current) use of insulin: Secondary | ICD-10-CM | POA: Diagnosis not present

## 2021-05-16 LAB — COMPREHENSIVE METABOLIC PANEL
ALT: 29 U/L (ref 0–44)
AST: 27 U/L (ref 15–41)
Albumin: 3.9 g/dL (ref 3.5–5.0)
Alkaline Phosphatase: 36 U/L — ABNORMAL LOW (ref 38–126)
Anion gap: 9 (ref 5–15)
BUN: 17 mg/dL (ref 6–20)
CO2: 24 mmol/L (ref 22–32)
Calcium: 9 mg/dL (ref 8.9–10.3)
Chloride: 107 mmol/L (ref 98–111)
Creatinine, Ser: 0.92 mg/dL (ref 0.61–1.24)
GFR, Estimated: >60 mL/min (ref >60)
Glucose, Bld: 147 mg/dL — ABNORMAL HIGH (ref 70–99)
Potassium: 3.7 mmol/L (ref 3.5–5.1)
Sodium: 140 mmol/L (ref 135–145)
Total Bilirubin: 0.4 mg/dL (ref 0.3–1.2)
Total Protein: 7.5 g/dL (ref 6.5–8.1)

## 2021-05-16 LAB — CBC
HCT: 41.1 % (ref 39.0–52.0)
Hemoglobin: 13.5 g/dL (ref 13.0–17.0)
MCH: 30 pg (ref 26.0–34.0)
MCHC: 32.8 g/dL (ref 30.0–36.0)
MCV: 91.3 fL (ref 80.0–100.0)
Platelets: 195 10*3/uL (ref 150–400)
RBC: 4.5 MIL/uL (ref 4.22–5.81)
RDW: 15.1 % (ref 11.5–15.5)
WBC: 5.9 10*3/uL (ref 4.0–10.5)
nRBC: 0 % (ref 0.0–0.2)

## 2021-05-16 LAB — LIPASE, BLOOD: Lipase: 27 U/L (ref 11–51)

## 2021-05-16 LAB — ETHANOL: Alcohol, Ethyl (B): <10 mg/dL (ref <10)

## 2021-05-16 NOTE — ED Provider Notes (Signed)
[facility] Emergency Department Provider Note MRN:  [id number]  Arrival date & time: 05/17/21     Chief Complaint   Intoxication History of Present Illness   [name] Maya. is a 60 y.o. year-old male with a history of polysubstance abuse, diabetes presenting to the ED with chief complaint of intoxication.  Patient is intoxicated and has trouble staying awake.  He tries to explain that he wants help getting off of his alcohol and cocaine.  Wants to "get straight" before he goes to detox tomorrow.  Denies any bodily complaints this time.  I was unable to obtain an accurate HPI, PMH, or ROS due to the patient's intoxication.  Level 5 caveat.  Review of Systems  Positive for intoxication.  Patient's Health History    Past Medical History:  Diagnosis Date  . Acid reflux   . Alcohol abuse    6-8 cans of beer daily; hx of incarceration for DWI  . Arthritis   . Bipolar disorder ([place])   . Bronchitis   . Chronic back pain   . Chronic neck pain   . Chronic pain   . Diabetes mellitus without complication ([place])   . Fracture of lower leg   . Gout   . Hyperlipidemia   . Hypertension   . Left arm pain    chronic  . Migraine   . Pancreatitis    March 2013  . Pseudocyst of pancreas 02/28/2012  . Substance abuse [facility])     Past Surgical History:  Procedure Laterality Date  . CIRCUMCISION  03/17/2012   Procedure: CIRCUMCISION ADULT;  Surgeon: Marissa Nestle, MD;  Location: [facility];  Service: Urology;  Laterality: N/A;  . COLONOSCOPY WITH PROPOFOL  12/24/2012   MPN:TIRW lesion as described above status post biopsy; otherwise normal rectum/Sigmoid diverticulosis/Sigmoid polyps -- resected as described above. anal lesion, benign. colon polyp, hyperplastic  . ESOPHAGOGASTRODUODENOSCOPY (EGD) WITH PROPOFOL  12/24/2012   ERX:VQMGQQP erythema and erosions of uncertain significance status post gastric biopsy (reactive gastropathy NO h.pylori)  . KNEE ARTHROSCOPY Right  08/09/2019   Procedure: arthroscopic incision and drainage snovectomy;  Surgeon: Paralee Cancel, MD;  Location: [facility];  Service: Orthopedics;  Laterality: Right;  . KNEE ARTHROSCOPY WITH MEDIAL MENISECTOMY Right 07/29/2019   Procedure: RIGHT KNEE ARTHROSCOPY WITH MEDIAL MENISCECTOMY AND LATERAL MENISCECTOMY;  Surgeon: Carole Civil, MD;  Location: [facility];  Service: Orthopedics;  Laterality: Right;  . NOSE SURGERY     broken nose  . TOTAL HIP ARTHROPLASTY Left 10/04/2016   Procedure: LEFT TOTAL HIP ARTHROPLASTY ANTERIOR APPROACH;  Surgeon: Mcarthur Rossetti, MD;  Location: [facility];  Service: Orthopedics;  Laterality: Left;    Family History  Problem Relation Age of Onset  . Diabetes Father   . Healthy Mother   . Anesthesia problems Neg Hx   . Hypotension Neg Hx   . Malignant hyperthermia Neg Hx   . Pseudochol deficiency Neg Hx   . Colon cancer Neg Hx   . Heart disease Neg Hx   . Stroke Neg Hx   . Cancer Neg Hx     Social History   Socioeconomic History  . Marital status: Single    Spouse name: Not on file  . Number of children: Not on file  . Years of education: Not on file  . Highest education level: Not on file  Occupational History  . Occupation: unemployed  Tobacco Use  . Smoking status: Current Every Day Smoker    Packs/day:  0.50    Years: 25.00    Pack years: 12.50    Types: Cigarettes  . Smokeless tobacco: Former Systems developer    Types: Chew    Quit date: 12/23/2014  Vaping Use  . Vaping Use: Never used  Substance and Sexual Activity  . Alcohol use: Yes    Comment: heavily  . Drug use: Yes    Types: Cocaine, Marijuana  . Sexual activity: Not Currently  Other Topics Concern  . Not on file  Social History Narrative  . Not on file   Social Determinants of Health   Financial Resource Strain: Not on file  Food Insecurity: Not on file  Transportation Needs: Not on file  Physical Activity: Not on file  Stress: Not on file  Social Connections: Not on file   Intimate Partner Violence: Not on file     Physical Exam   Vitals:   05/17/21 0400 05/17/21 0430  BP: 121/82 110/83  Pulse: 80   Resp: 20   Temp:    SpO2: 99%     CONSTITUTIONAL: Well-appearing, NAD NEURO: Somnolent, wakes easily but falls back asleep, moves all extremities, slurred speech EYES:  eyes equal and reactive ENT/NECK:  no LAD, no JVD CARDIO: Regular rate, well-perfused, normal S1 and S2 PULM:  CTAB no wheezing or rhonchi GI/GU:  normal bowel sounds, non-distended, non-tender MSK/SPINE:  No gross deformities, no edema SKIN:  no rash, atraumatic PSYCH:  Appropriate speech and behavior  *Additional and/or pertinent findings included in MDM below  Diagnostic and Interventional Summary    EKG Interpretation  Date/Time:    Ventricular Rate:    PR Interval:    QRS Duration:   QT Interval:    QTC Calculation:   R Axis:     Text Interpretation:        Labs Reviewed  COMPREHENSIVE METABOLIC PANEL - Abnormal; Notable for the following components:      Result Value   Glucose, Bld 147 (*)    Alkaline Phosphatase 36 (*)    All other components within normal limits  LIPASE, BLOOD  CBC  ETHANOL  URINALYSIS, ROUTINE W REFLEX MICROSCOPIC  RAPID URINE DRUG SCREEN, HOSP PERFORMED    No orders to display    Medications - No data to display   Procedures  /  Critical Care Procedures  ED Course and Medical Decision Making  I have reviewed the triage vital signs, the nursing notes, and pertinent available records from the EMR.  Listed above are laboratory and imaging tests that I personally ordered, reviewed, and interpreted and then considered in my medical decision making (see below for details).  Intoxication, wants help with detox, will allow for some metabolization and reassess.  Vital signs normal, well-appearing, nontraumatic, nonfocal neurological exam.     On reassessment patient is more awake, conversant, more clinically sober.  Labs are reassuring,  alcohol level was 0 and so suspect other form of intoxication.  Nonfocal neurological exam.  He is requesting admission for detox but I explained that this is not a service that we provide.  Referred to outpatient resources.  He is sitting comfortably with normal vital signs, no signs or symptoms of acute withdrawal.  Barth Kirks. Sedonia Small, MD [place] [email or web address]  Final Clinical Impressions(s) / ED Diagnoses     ICD-10-CM   1. Drug intoxication without complication ([place])  Q22.297     ED Discharge Orders    None  Discharge Instructions Discussed with and Provided to Patient:     Discharge Instructions     You were evaluated in the Emergency Department and after careful evaluation, we did not find any emergent condition requiring admission or further testing in the hospital.  Your exam/testing today was overall reassuring.  Please return to the Emergency Department if you experience any worsening of your condition.  Thank you for allowing [place] to be a part of your care.        Maudie Flakes, MD 05/17/21 [phone]

## 2021-05-16 NOTE — ED Triage Notes (Signed)
Pt states he wants detox from alcohol, cocaine, opioids, and marijuana. Pt endorses abdominal pain and diarrhea.

## 2021-05-17 ENCOUNTER — Encounter (HOSPITAL_COMMUNITY): Payer: Self-pay | Admitting: Emergency Medicine

## 2021-05-17 ENCOUNTER — Other Ambulatory Visit: Payer: Self-pay

## 2021-05-17 ENCOUNTER — Emergency Department (HOSPITAL_COMMUNITY)
Admission: EM | Admit: 2021-05-17 | Discharge: 2021-05-18 | Disposition: A | Discharge status: Home / Self Care | Payer: Medicare Other | Source: Home / Self Care | Attending: Emergency Medicine | Admitting: Emergency Medicine

## 2021-05-17 ENCOUNTER — Ambulatory Visit (HOSPITAL_COMMUNITY)
Admission: RE | Admit: 2021-05-17 | Discharge: 2021-05-17 | Disposition: A | Discharge status: Home / Self Care | Payer: Medicare Other | Source: Home / Self Care | Attending: Psychiatry | Admitting: Psychiatry

## 2021-05-17 ENCOUNTER — Ambulatory Visit (INDEPENDENT_AMBULATORY_CARE_PROVIDER_SITE_OTHER)
Admission: EM | Admit: 2021-05-17 | Discharge: 2021-05-17 | Disposition: A | Discharge status: Home / Self Care | Payer: Medicare Other | Source: Home / Self Care

## 2021-05-17 DIAGNOSIS — F1910 Other psychoactive substance abuse, uncomplicated: Secondary | ICD-10-CM

## 2021-05-17 DIAGNOSIS — F1290 Cannabis use, unspecified, uncomplicated: Secondary | ICD-10-CM | POA: Insufficient documentation

## 2021-05-17 DIAGNOSIS — F1490 Cocaine use, unspecified, uncomplicated: Secondary | ICD-10-CM | POA: Insufficient documentation

## 2021-05-17 DIAGNOSIS — Z96642 Presence of left artificial hip joint: Secondary | ICD-10-CM | POA: Insufficient documentation

## 2021-05-17 DIAGNOSIS — Z794 Long term (current) use of insulin: Secondary | ICD-10-CM | POA: Insufficient documentation

## 2021-05-17 DIAGNOSIS — Z79899 Other long term (current) drug therapy: Secondary | ICD-10-CM | POA: Insufficient documentation

## 2021-05-17 DIAGNOSIS — R45850 Homicidal ideations: Secondary | ICD-10-CM | POA: Insufficient documentation

## 2021-05-17 DIAGNOSIS — R451 Restlessness and agitation: Secondary | ICD-10-CM | POA: Insufficient documentation

## 2021-05-17 DIAGNOSIS — Z7984 Long term (current) use of oral hypoglycemic drugs: Secondary | ICD-10-CM | POA: Insufficient documentation

## 2021-05-17 DIAGNOSIS — R45851 Suicidal ideations: Secondary | ICD-10-CM | POA: Insufficient documentation

## 2021-05-17 DIAGNOSIS — R443 Hallucinations, unspecified: Secondary | ICD-10-CM | POA: Insufficient documentation

## 2021-05-17 DIAGNOSIS — E1140 Type 2 diabetes mellitus with diabetic neuropathy, unspecified: Secondary | ICD-10-CM | POA: Insufficient documentation

## 2021-05-17 DIAGNOSIS — F17210 Nicotine dependence, cigarettes, uncomplicated: Secondary | ICD-10-CM | POA: Insufficient documentation

## 2021-05-17 DIAGNOSIS — I10 Essential (primary) hypertension: Secondary | ICD-10-CM | POA: Insufficient documentation

## 2021-05-17 DIAGNOSIS — Z7289 Other problems related to lifestyle: Secondary | ICD-10-CM | POA: Insufficient documentation

## 2021-05-17 MED ORDER — AMLODIPINE BESYLATE 5 MG PO TABS
5.0000 mg | ORAL_TABLET | Freq: Every day | ORAL | Status: DC
  Filled 2021-05-17: qty 10

## 2021-05-17 MED ORDER — AMLODIPINE BESYLATE 5 MG PO TABS
5.0000 mg | ORAL_TABLET | Freq: Once | ORAL | Status: AC
  Administered 2021-05-17: 5 mg via ORAL
  Filled 2021-05-17: qty 1

## 2021-05-17 NOTE — ED Triage Notes (Signed)
Pt requesting detox from Cocaine, marijuana and alcohol. Last used alcohol yesterday, no signs of withdrawals, CIWA 0.

## 2021-05-17 NOTE — Discharge Instructions (Addendum)
You were evaluated in the Emergency Department and after careful evaluation, we did not find any emergent condition requiring admission or further testing in the hospital.  Your exam/testing today was overall reassuring.  Please return to the Emergency Department if you experience any worsening of your condition.  Thank you for allowing us to be a part of your care.  

## 2021-05-17 NOTE — Discharge Summary (Signed)
[name] to be D/C'd per MD order. Discussed with the patient and all questions fully answered. An After Visit Summary was printed and given to the patient. Medication sample also given to pt. Patient escorted out and D/C home via safe transport.  Clois Dupes  05/17/2021 11:56 AM

## 2021-05-17 NOTE — Progress Notes (Signed)
CSW met with patient and discused next steps regarding detox and residential treatment. Patient reports that he was discharged from [facility] due to high blood sugars.  Patient reports that he has been calling [facility] and [facility] for ongoing residential treatment.  CSW agreed to fax assessment notes to [facility] for assistance with admission. Patient reports that [facility] has told him they may have bed availability tomorrow, 05/18/2021. Patient reports that he needs shelter for tonight and ability to do laundry.  Patient agreeable to go to [facility] to look for additional shelter resources and to do his laundry and that he would follow up with [facility] and [facility] for bed availability.   CSW agreed to fax information to [place] [facility].    Tasheka Houseman, LCSW, [place] Social Worker  [facility]

## 2021-05-17 NOTE — Progress Notes (Signed)
One time dose of Amlodipine 5mg   PO was administered to pt per MD order. No adverse reaction noted. Will continue to monitor.

## 2021-05-17 NOTE — H&P (Addendum)
Behavioral Health Medical Screening Exam  [name] [name]. is a 60 y.o. male who presents to the [facility] voluntarily as a walk-in unaccompanied with a chief complaint of "I need to be safe and not do drugs."   Patient reports that he is going to rehab tomorrow at [facility] treatment center for a 90 day program. He states that he doesn't have anywhere to stay tonight and he's afraid that he will do illegal drugs tonight, alcohol and cocaine.   On approach, the patient is alert and oriented x 4. Patient answers questions appropriately. Patient thought process is logical, speech coherent and tone ranges between normal to loud. During the assessment the patient became somewhat easily agitated and started to yell and curse at this provider. Patient is causally dressed. Patient denies suicidal thoughts, and states "not today, not currently."  Patient denies self harm behaviors. Patient endorses homicidal ideations and states that he will hurt someone and states that he got ripped off recently. Patient vaguely endorses AVH and states "a little bit". He reports hearing "something" and reports seeing "black spots." Patient does not appear to be responding to internal, or external stimuli. Patient's drug hx includes: drinking beer daily, and last drink was yesterday. Daily cocaine use, on average a gram to half a gram per day and last used on Tuesday. Daily marijuana use, "1 blunt everyday."   Collateral:Patient declined for this provider to contact his sister, or brother for collateral information and to discuss his treatment plan for going to the [facility] treatment center tomorrow at 3:30 pm (self reported plan). This Probation officer then asked if it would be okay to contact the [facility] treatment facility to inquire about his arrangements for treatment tomorrow and the patient stated "[place]." He refused to provide this provider with the contact information for [facility] treatment  facility and declined for me to contact the facility.  Per chart review:  Patient was evaluated at the [facility] earlier today and provided with resources for housing/shelters. Patient was seen at [facility] on 05/16/21 requesting detox treatment.   Patient declined outpatient resources. The patient was only interested in receiving a bus pass and something to eat. The patient was given a bus pass at his request and a sandwich tray. The patient was safely released to the lobby. Patient states that he will catch the bus to [facility] and see if he can stay there for tonight.   Total Time spent with patient: 15 minutes  Psychiatric Specialty Exam:  Presentation  General Appearance: Appropriate for Environment  Eye Contact:Fair  Speech:Clear and Coherent  Speech Volume:Normal  Handedness:Right   Mood and Affect  Mood:Hopeless  Affect:Congruent   Thought Process  Thought Processes:Coherent  Descriptions of Associations:Intact  Orientation:Full (Time, Place and Person)  Thought Content:Logical  History of Schizophrenia/Schizoaffective disorder:No  Duration of Psychotic Symptoms:Less than six months  Hallucinations:Hallucinations: Other (comment) ("a little bit.")  Ideas of Reference:None  Suicidal Thoughts:Suicidal Thoughts: No  Homicidal Thoughts:Homicidal Thoughts: Yes, Passive HI Passive Intent and/or Plan: Without Intent; Without Plan   Sensorium  Memory:Immediate Fair; Recent Fair; Remote Fair  Judgment:Intact  Insight:Present   Executive Functions  Concentration:Fair  Attention Span:Fair  [place]   Psychomotor Activity  Psychomotor Activity:Psychomotor Activity: Normal   Assets  Assets:Communication Skills; Desire for Improvement   Sleep  Sleep:Sleep: Fair Number of Hours of Sleep: 6    Physical Exam: Physical Exam Constitutional:  Appearance: Normal appearance.  HENT:     Head:  Normocephalic and atraumatic.     Nose: Nose normal.  Eyes:     Conjunctiva/sclera: Conjunctivae normal.  Pulmonary:     Effort: Pulmonary effort is normal.  Musculoskeletal:        General: Normal range of motion.     Cervical back: Normal range of motion.  Neurological:     General: No focal deficit present.     Mental Status: He is alert and oriented to person, place, and time.  Psychiatric:        Thought Content: Thought content normal.    Review of Systems  Constitutional: Negative.   HENT: Negative.   Eyes: Negative.   Respiratory: Negative.   Cardiovascular: Negative.   Gastrointestinal: Negative.   Genitourinary: Negative.   Musculoskeletal: Negative.   Skin: Negative.   Neurological: Negative.   Endo/Heme/Allergies: Negative.   Psychiatric/Behavioral: Positive for hallucinations and substance abuse. Negative for depression and suicidal ideas. The patient does not have insomnia.    There were no vitals taken for this visit. There is no height or weight on file to calculate BMI. Temp 98.5, B/p 134/71, HR 61, O2 98%  Musculoskeletal: Strength & Muscle Tone: within normal limits Gait & Station: normal Patient leans: N/A   Recommendations:  Based on my evaluation the patient does not appear to have an emergency medical condition.   Based on my evaluation the patient does not meet criteria for inpatient, or continuous overnight observation treatment. The patient made it clear that he is trying to find a place to sleep for tonight.   No evidence of imminent risk to self or others at present. No psychosis observed.   Supportive therapy provided about ongoing stressors.  Discussed crisis plan, support from social network, calling 911, coming to the Emergency Department or [facility] and calling the Suicide Hotline  Patient declined resources for outpatient resources, housing, and shelters.   Marissa Calamity, NP 05/17/2021, 11:24  PM

## 2021-05-17 NOTE — Progress Notes (Addendum)
Patient presents to the [facility] voluntarily as a walk-in  seeking substance abuse treatment placement. Patient was just seen at [facility] for the same issue and he was discharged and referred to the [facility].  Patient states that the [facility] referred him to the [facility] on Tuesday and he was evaluated and not accepted due to his glucose level and HTN.  Patient is drinking a 12 pack of beer daily and he is using 2 grams of cocaine and some marijuana. No use of marijuana or cocaine since Tuesday.  Patient is requesting to go back to a detox or treatment center and requesting help from [place] to get there.  Patient is routine.

## 2021-05-17 NOTE — Discharge Instructions (Signed)
Patient is instructed to take all prescribed medications as recommended. Report any side effects or adverse reactions to your outpatient psychiatrist. Patient is instructed to abstain from alcohol and illegal drugs while on prescription medications. In the event of worsening symptoms, patient is instructed to call the crisis hotline, 911, or go to the nearest emergency department for evaluation and treatment.   

## 2021-05-17 NOTE — ED Provider Notes (Signed)
Behavioral Health Urgent Care Medical Screening Exam  Patient Name: [name] [name]. MRN: [id number] Date of Evaluation: 05/17/21 Chief Complaint:   Trying to get into a detox program Diagnosis:  Final diagnoses:  Polysubstance abuse ([place])    History of Present illness: [name] [name]. is a 60 y.o. male w/ PMH of polysubstance use. Patient reports that he is trying to go to a detox program and he was turned away yesterday due to elevated BP and BGL. Patient went to [facility] and patient was medically cleared. Patient asked to be brought to [facility] for further assistance. Patient reports that he is out of his amlodipine but he has been taking his other medications as recommended. Patient denies SI, HI, and AVH. Patient reports he has not had any cocaine or EtoH since Tuesday and this is confirmed in patient's recent blood work. Patient is overall very pleasant and looking for more assistance.   Psychiatric Specialty Exam  Presentation  General Appearance:Appropriate for Environment  Eye Contact:Good  Speech:Clear and Coherent  Speech Volume:Normal  Handedness:Right   Mood and Affect  Mood:Euthymic  Affect:Congruent   Thought Process  Thought Processes:Coherent  Descriptions of Associations:Intact  Orientation:Full (Time, Place and Person)  Thought Content:Logical  Diagnosis of Schizophrenia or Schizoaffective disorder in past: No  Duration of Psychotic Symptoms: Less than six months  Hallucinations:None  Ideas of Reference:None  Suicidal Thoughts:No Without Intent; Without Plan  Homicidal Thoughts:No   Sensorium  Memory:Immediate Good; Recent Good; Remote Good  Judgment:Good  Insight:Fair   Executive Functions  Concentration:Good  Attention Span:Good  [place] of Knowledge:Good  Language:Good   Psychomotor Activity  Psychomotor Activity:Normal   Assets  Assets:Desire for Improvement; Armed forces logistics/support/administrative officer; Resilience   Sleep   Sleep:Fair  Number of hours: 6   No data recorded  Physical Exam: Physical Exam Constitutional:      Appearance: Normal appearance.  HENT:     Head: Normocephalic and atraumatic.     Nose: Nose normal.  Eyes:     Extraocular Movements: Extraocular movements intact.     Pupils: Pupils are equal, round, and reactive to light.  Cardiovascular:     Rate and Rhythm: Normal rate.     Pulses: Normal pulses.  Pulmonary:     Effort: Pulmonary effort is normal.  Musculoskeletal:        General: Normal range of motion.  Skin:    General: Skin is warm and dry.  Neurological:     General: No focal deficit present.     Mental Status: He is alert.    Review of Systems  Constitutional: Negative for chills and fever.  HENT: Negative for hearing loss.   Eyes: Negative for blurred vision.  Respiratory: Negative for cough and wheezing.   Cardiovascular: Negative for chest pain.  Gastrointestinal: Negative for abdominal pain.  Neurological: Negative for dizziness.  Psychiatric/Behavioral: Negative for suicidal ideas.   Blood pressure (!) 120/95, pulse 81, temperature 99 F (37.2 C), temperature source Oral, resp. rate 18, height 5\' 9"  (1.753 m), weight 204 lb (92.5 kg), SpO2 100 %. Body mass index is 30.13 kg/m.  Musculoskeletal: Strength & Muscle Tone: within normal limits Gait & Station: normal Patient leans: N/A   [place] [facility] Discharge Disposition for Follow up and Recommendations: Based on my evaluation the patient does not appear to have an emergency medical condition and can be discharged with resources and follow up care in outpatient services for Substance abuse inpatient programs Polysubstance Use - patient will be provided  7 day samples of his amlodipine  - SW will see patient and help him with his goal of getting into detox Patient is clearly very determined as he has been documented going to facilities and calling throughout the week and patient has gone 1 day without  use.   PGY-1 Freida Busman, MD 05/17/2021, 10:55 AM

## 2021-05-18 ENCOUNTER — Inpatient Hospital Stay (HOSPITAL_COMMUNITY)
Admission: RE | Admit: 2021-05-18 | Payer: Medicare Other | Source: Intra-hospital | Admitting: Physical Medicine and Rehabilitation

## 2021-05-18 LAB — HEMOGLOBIN A1C
Hgb A1c MFr Bld: 7.7 % — ABNORMAL HIGH (ref 4.8–5.6)
Mean Plasma Glucose: 174 mg/dL

## 2021-05-18 LAB — COMPREHENSIVE METABOLIC PANEL
ALT: 22 U/L (ref 0–44)
AST: 21 U/L (ref 15–41)
Albumin: 3.4 g/dL — ABNORMAL LOW (ref 3.5–5.0)
Alkaline Phosphatase: 29 U/L — ABNORMAL LOW (ref 38–126)
Anion gap: 9 (ref 5–15)
BUN: 20 mg/dL (ref 6–20)
CO2: 24 mmol/L (ref 22–32)
Calcium: 8.7 mg/dL — ABNORMAL LOW (ref 8.9–10.3)
Chloride: 107 mmol/L (ref 98–111)
Creatinine, Ser: 0.99 mg/dL (ref 0.61–1.24)
GFR, Estimated: >60 mL/min (ref >60)
Glucose, Bld: 112 mg/dL — ABNORMAL HIGH (ref 70–99)
Potassium: 3.7 mmol/L (ref 3.5–5.1)
Sodium: 140 mmol/L (ref 135–145)
Total Bilirubin: 0.5 mg/dL (ref 0.3–1.2)
Total Protein: 6 g/dL — ABNORMAL LOW (ref 6.5–8.1)

## 2021-05-18 LAB — CBC WITH DIFFERENTIAL/PLATELET
Abs Immature Granulocytes: 0.03 10*3/uL (ref 0.00–0.07)
Basophils Absolute: 0.1 10*3/uL (ref 0.0–0.1)
Basophils Relative: 1 %
Eosinophils Absolute: 0.1 10*3/uL (ref 0.0–0.5)
Eosinophils Relative: 2 %
HCT: 38.3 % — ABNORMAL LOW (ref 39.0–52.0)
Hemoglobin: 12.9 g/dL — ABNORMAL LOW (ref 13.0–17.0)
Immature Granulocytes: 1 %
Lymphocytes Relative: 38 %
Lymphs Abs: 2.3 10*3/uL (ref 0.7–4.0)
MCH: 30.9 pg (ref 26.0–34.0)
MCHC: 33.7 g/dL (ref 30.0–36.0)
MCV: 91.8 fL (ref 80.0–100.0)
Monocytes Absolute: 0.4 10*3/uL (ref 0.1–1.0)
Monocytes Relative: 6 %
Neutro Abs: 3.1 10*3/uL (ref 1.7–7.7)
Neutrophils Relative %: 52 %
Platelets: 235 10*3/uL (ref 150–400)
RBC: 4.17 MIL/uL — ABNORMAL LOW (ref 4.22–5.81)
RDW: 14.9 % (ref 11.5–15.5)
WBC: 5.9 10*3/uL (ref 4.0–10.5)
nRBC: 0 % (ref 0.0–0.2)
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
## 2021-05-18 NOTE — ED Notes (Signed)
Pt given a cup of coffee.

## 2021-05-18 NOTE — ED Provider Notes (Signed)
[facility] EMERGENCY DEPARTMENT Provider Note   CSN: [id number] Arrival date & time: 05/17/21  2252     History Chief Complaint  Patient presents with  . Drug Problem    [name] [name]. is a 61 y.o. male.  Planning to go to detox later today in [place].  Patient states that he needs abs done for clearance.  I discussed with him that his labs been done 3 times in the last week he states he still needs an A1c checked.  Patient with no other complaints at this time.   Drug Problem       Past Medical History:  Diagnosis Date  . Acid reflux   . Alcohol abuse    6-8 cans of beer daily; hx of incarceration for DWI  . Arthritis   . Bipolar disorder ([place])   . Bronchitis   . Chronic back pain   . Chronic neck pain   . Chronic pain   . Diabetes mellitus without complication ([place])   . Fracture of lower leg   . Gout   . Hyperlipidemia   . Hypertension   . Left arm pain    chronic  . Migraine   . Pancreatitis    March 2013  . Pseudocyst of pancreas 02/28/2012  . Substance abuse [facility])     Patient Active Problem List   Diagnosis Date Noted  . Substance induced mood disorder ([place]) 01/19/2021  . Alcohol use disorder, severe, dependence ([place]) 01/05/2021  . Cocaine use disorder, moderate, dependence ([place]) 01/05/2021  . Depression 01/05/2021  . Type 2 diabetes mellitus with diabetic neuropathy, without long-term current use of insulin ([place]) 09/21/2020  . Septic arthritis ([place]) 08/09/2019  . S/P right knee arthroscopy 07/29/2019 08/03/2019  . Acute lateral meniscus tear of right knee   . Acute medial meniscus tear of right knee   . Substance use disorder 03/16/2019  . History of diabetes mellitus 10/10/2016  . Avascular necrosis of hip, left ([place]) 10/04/2016  . Status post left hip replacement 10/04/2016  . Rash and nonspecific skin eruption 08/12/2013  . Cholelithiases 08/04/2013  . Subcutaneous nodule 07/21/2013  . Acute alcoholic pancreatitis  10/19/2535  . Syncope 04/11/2013  . Prolonged Q-T interval on ECG 04/11/2013  . Cocaine abuse ([place]) 03/13/2013    Class: Chronic  . At risk for adverse drug event 02/14/2013  . DDD (degenerative disc disease), lumbar 02/01/2013  . Dyspepsia 12/01/2012  . BPH (benign prostatic hyperplasia) 10/07/2012  . Allergic rhinitis 10/03/2012  . Transaminitis 10/02/2012  . Chronic pain syndrome 10/02/2012  . Essential hypertension, benign 07/01/2012  . Tobacco user 07/01/2012  . Hepatic steatosis 03/06/2012  . ED (erectile dysfunction) 03/06/2012  . Pseudocyst of pancreas 02/28/2012  . Alcohol abuse 02/25/2012  . GERD (gastroesophageal reflux disease) 02/23/2012  . Bipolar 1 disorder ([place]) 02/23/2012  . Hypertriglyceridemia 12/05/2011    Past Surgical History:  Procedure Laterality Date  . CIRCUMCISION  03/17/2012   Procedure: CIRCUMCISION ADULT;  Surgeon: Marissa Nestle, MD;  Location: [facility];  Service: Urology;  Laterality: N/A;  . COLONOSCOPY WITH PROPOFOL  12/24/2012   UYQ:IHKV lesion as described above status post biopsy; otherwise normal rectum/Sigmoid diverticulosis/Sigmoid polyps -- resected as described above. anal lesion, benign. colon polyp, hyperplastic  . ESOPHAGOGASTRODUODENOSCOPY (EGD) WITH PROPOFOL  12/24/2012   QQV:[id number] erythema and erosions of uncertain significance status post gastric biopsy (reactive gastropathy NO h.pylori)  . KNEE ARTHROSCOPY Right 08/09/2019   Procedure: arthroscopic incision and drainage snovectomy;  Surgeon: Paralee Cancel, MD;  Location: [facility];  Service: Orthopedics;  Laterality: Right;  . KNEE ARTHROSCOPY WITH MEDIAL MENISECTOMY Right 07/29/2019   Procedure: RIGHT KNEE ARTHROSCOPY WITH MEDIAL MENISCECTOMY AND LATERAL MENISCECTOMY;  Surgeon: Carole Civil, MD;  Location: [facility];  Service: Orthopedics;  Laterality: Right;  . NOSE SURGERY     broken nose  . TOTAL HIP ARTHROPLASTY Left 10/04/2016   Procedure: LEFT TOTAL HIP ARTHROPLASTY ANTERIOR  APPROACH;  Surgeon: Mcarthur Rossetti, MD;  Location: [facility];  Service: Orthopedics;  Laterality: Left;       Family History  Problem Relation Age of Onset  . Diabetes Father   . Healthy Mother   . Anesthesia problems Neg Hx   . Hypotension Neg Hx   . Malignant hyperthermia Neg Hx   . Pseudochol deficiency Neg Hx   . Colon cancer Neg Hx   . Heart disease Neg Hx   . Stroke Neg Hx   . Cancer Neg Hx     Social History   Tobacco Use  . Smoking status: Current Every Day Smoker    Packs/day: 0.50    Years: 25.00    Pack years: 12.50    Types: Cigarettes  . Smokeless tobacco: Former Systems developer    Types: Chew    Quit date: 12/23/2014  Vaping Use  . Vaping Use: Never used  Substance Use Topics  . Alcohol use: Yes    Comment: heavily  . Drug use: Yes    Types: Cocaine, Marijuana    Home Medications Prior to Admission medications   Medication Sig Start Date End Date Taking? Authorizing Provider  amLODipine-benazepril (LOTREL) 5-10 MG capsule Take 1 capsule by mouth daily. 05/17/21  Yes [provider]  citalopram (CELEXA) 20 MG tablet Take 1 tablet (20 mg total) by mouth daily. 05/04/21  Yes Money, Lowry Ram, FNP  fluticasone (FLONASE) 50 MCG/ACT nasal spray Place 1 spray into both nostrils daily as needed for allergies or rhinitis.   Yes [provider]  gabapentin (NEURONTIN) 400 MG capsule Take 400 mg by mouth 3 (three) times daily.   Yes [provider]  glipiZIDE (GLUCOTROL) 5 MG tablet Take 1 tablet (5 mg total) by mouth daily. 05/04/21  Yes Money, Lowry Ram, FNP  guaiFENesin (MUCINEX) 600 MG 12 hr tablet Take 600 mg by mouth 2 (two) times daily as needed for cough or to loosen phlegm.   Yes [provider]  insulin glargine (LANTUS) 100 UNIT/ML injection Inject 0.2 mLs (20 Units total) into the skin daily. Patient taking differently: Inject 2-8 Units into the skin daily as needed. Sliding scale 2-8 05/05/21  Yes Money, Lowry Ram, FNP  insulin  lispro (HUMALOG) 100 UNIT/ML injection Inject 20 Units into the skin daily as needed for high blood sugar.   Yes [provider]  levocetirizine (XYZAL) 5 MG tablet Take 5 mg by mouth daily. 05/17/21  Yes [provider]  lisinopril (ZESTRIL) 10 MG tablet Take 10 mg by mouth daily.   Yes [provider]  loratadine (CLARITIN) 10 MG tablet Take 1 tablet (10 mg total) by mouth daily. Patient taking differently: Take 10 mg by mouth daily as needed for allergies. 05/05/21  Yes Money, Lowry Ram, FNP  metFORMIN (GLUCOPHAGE) 1000 MG tablet Take 1,000 mg by mouth 2 (two) times daily with a meal.   Yes [provider]  OLANZapine (ZYPREXA) 20 MG tablet Take 20 mg by mouth in the morning and at bedtime.  Yes [provider]  omeprazole (PRILOSEC) 40 MG capsule Take 40 mg by mouth daily.   Yes [provider]  ondansetron (ZOFRAN-ODT) 4 MG disintegrating tablet Take 4 mg by mouth every 8 (eight) hours as needed for nausea or vomiting.   Yes [provider]  oxyCODONE (OXY IR/ROXICODONE) 5 MG immediate release tablet Take 5 mg by mouth every 6 (six) hours as needed for severe pain or moderate pain.   Yes [provider]  oxymetazoline (AFRIN) 0.05 % nasal spray Place 1 spray into both nostrils 2 (two) times daily as needed for congestion.   Yes [provider]  pantoprazole (PROTONIX) 40 MG tablet Take 1 tablet (40 mg total) by mouth daily. 05/05/21  Yes Money, Lowry Ram, FNP  tamsulosin (FLOMAX) 0.4 MG CAPS capsule Take 1 capsule (0.4 mg total) by mouth daily. 05/04/21  Yes Money, Lowry Ram, FNP  traZODone (DESYREL) 100 MG tablet Take 100 mg by mouth at bedtime as needed for sleep.   Yes [provider]  VASCEPA 1 g capsule Take 2 capsules (2 g total) by mouth 2 (two) times daily. 05/04/21  Yes Money, Lowry Ram, FNP  amLODipine (NORVASC) 5 MG tablet Take 1 tablet (5 mg total) by mouth daily. Patient not taking: Reported on  05/18/2021 05/05/21   Money, Lowry Ram, FNP  benazepril (LOTENSIN) 10 MG tablet Take 1 tablet (10 mg total) by mouth daily. Patient not taking: Reported on 05/18/2021 05/05/21   Money, Lowry Ram, FNP  metFORMIN (GLUCOPHAGE) 500 MG tablet Take 2 tablets (1,000 mg total) by mouth 2 (two) times daily with a meal. Patient not taking: Reported on 05/18/2021 05/04/21   Money, Lowry Ram, FNP  traZODone (DESYREL) 50 MG tablet Take 1 tablet (50 mg total) by mouth at bedtime as needed for sleep. Patient not taking: Reported on 05/18/2021 05/04/21   Money, Lowry Ram, FNP    Allergies    Bee venom and Penicillins  Review of Systems   Review of Systems  All other systems reviewed and are negative.   Physical Exam Updated Vital Signs BP 115/83   Pulse 73   Temp (!) 97.5 F (36.4 C) (Oral)   Resp 13   SpO2 92%   Physical Exam Vitals and nursing note reviewed.  Constitutional:      Appearance: He is well-developed.  HENT:     Head: Normocephalic and atraumatic.     Nose: Nose normal. No congestion or rhinorrhea.     Mouth/Throat:     Mouth: Mucous membranes are moist.     Pharynx: Oropharynx is clear.  Eyes:     Pupils: Pupils are equal, round, and reactive to light.  Cardiovascular:     Rate and Rhythm: Normal rate.  Pulmonary:     Effort: Pulmonary effort is normal. No respiratory distress.  Abdominal:     General: Abdomen is flat. There is no distension.  Musculoskeletal:        General: Normal range of motion.     Cervical back: Normal range of motion.  Skin:    General: Skin is warm and dry.     Coloration: Skin is not jaundiced or pale.  Neurological:     General: No focal deficit present.     Mental Status: He is alert.     ED Results / Procedures / Treatments   Labs (all labs ordered are listed, but only abnormal results are displayed) Labs Reviewed  CBC WITH DIFFERENTIAL/PLATELET - Abnormal; Notable for  the following components:      Result Value   RBC 4.17 (*)     Hemoglobin 12.9 (*)    HCT 38.3 (*)    All other components within normal limits  COMPREHENSIVE METABOLIC PANEL - Abnormal; Notable for the following components:   Glucose, Bld 112 (*)    Calcium 8.7 (*)    Total Protein 6.0 (*)    Albumin 3.4 (*)    Alkaline Phosphatase 29 (*)    All other components within normal limits  HEMOGLOBIN A1C    EKG None  Radiology No results found.  Procedures Procedures   Medications Ordered in ED Medications - No data to display  ED Course  I have reviewed the triage vital signs and the nursing notes.  Pertinent labs & imaging results that were available during my care of the patient were reviewed by me and considered in my medical decision making (see chart for details).    MDM Rules/Calculators/A&P                          Patient with CIWA of 0.  No evidence of active withdrawal.  Patient A1c ordered but patient is able to be discharged and can follow-up at lab on his own.  He already has plans in place for his detoxification and rehabilitation.  Final Clinical Impression(s) / ED Diagnoses Final diagnoses:  Polysubstance abuse [facility])    Rx / DC Orders ED Discharge Orders    None       Kelcey Wickstrom, Corene Cornea, MD 05/18/21 [phone]

## 2021-06-26 ENCOUNTER — Telehealth: Payer: Self-pay | Admitting: Orthopedic Surgery

## 2021-06-26 ENCOUNTER — Other Ambulatory Visit: Payer: Self-pay | Admitting: Orthopedic Surgery

## 2021-06-26 NOTE — Telephone Encounter (Signed)
Patient came to office in person to ask about scheduling an appointment; mentioned he is aware of the 08/13/21 appointment. He came in following a voice message he had left in which he asked mainly about getting pain medicine. He also stated in the voice message that he would "pee in a cup" if necessary.  When he arrived to office, we discussed the appointments, and he was given a 06/28/21 (this Thursday) time slot to see Dr Aline Brochure for his knee "fluid."  We discussed masking, as patient did have on a cloth mask, and was advised that when he comes to the scheduled visit, we can provide a surgical mask if he does not have one.  He then asked if he can have a spare one today. Both of [place] at the front desk relayed that we provide them at the appointment, and patient then became agitated, stated "never mind about the appointment, I'll just go to a doctor in [place]," and left.  At this point, I have left the appointments on the schedule as is.

## 2021-06-27 NOTE — Telephone Encounter (Signed)
Noted  

## 2021-06-28 ENCOUNTER — Ambulatory Visit: Payer: Medicare Other | Admitting: Orthopedic Surgery

## 2021-06-28 NOTE — Progress Notes (Deleted)
FOLLOW UP   Encounter Diagnoses  Name Primary?   Chronic pain of right knee Yes   Effusion, right knee    S/P right knee arthroscopy    Staphylococcal arthritis of right knee ([place]) post op knee infection       No chief complaint on file.    ***

## 2021-07-03 ENCOUNTER — Ambulatory Visit
Admission: EM | Admit: 2021-07-03 | Discharge: 2021-07-03 | Disposition: A | Discharge status: Home / Self Care | Payer: Medicare Other | Attending: Family Medicine | Admitting: Family Medicine

## 2021-07-03 ENCOUNTER — Other Ambulatory Visit: Payer: Self-pay

## 2021-07-03 ENCOUNTER — Encounter: Payer: Self-pay | Admitting: Emergency Medicine

## 2021-07-03 DIAGNOSIS — B029 Zoster without complications: Secondary | ICD-10-CM | POA: Diagnosis not present

## 2021-07-03 MED ORDER — KETOROLAC TROMETHAMINE 60 MG/2ML IM SOLN
60.0000 mg | Freq: Once | INTRAMUSCULAR | Status: AC
  Administered 2021-07-03: 60 mg via INTRAMUSCULAR

## 2021-07-03 MED ORDER — GABAPENTIN 400 MG PO CAPS: 400.0000 mg | ORAL_CAPSULE | Freq: Three times a day (TID) | ORAL | 0 refills | Status: DC

## 2021-07-03 MED ORDER — TRIAMCINOLONE ACETONIDE 0.1 % EX CREA: 1.0000 "application " | TOPICAL_CREAM | Freq: Two times a day (BID) | CUTANEOUS | 0 refills | Status: DC

## 2021-07-03 NOTE — ED Triage Notes (Signed)
Burning Pain to lower LT back that radiates to hip.  Pt had shingles recently, rash is still visible.

## 2021-07-06 NOTE — ED Provider Notes (Signed)
[facility]    CSN: [id number] Arrival date & time: 07/03/21  1816      History   Chief Complaint No chief complaint on file.   HPI [name] [name]. is a 60 y.o. male.   HPI Patient present today with shingles rash which is visible on left lower back causing pain.Reports pain is radiating down the left hip. He was treated while at facility in [place] with Valtrex and hydrocodone but reports pain is severe and rash has not improved. Diagnosed with shingles x 9 days ago. He has completed 10 days of valtrex without improvement. He denies fever or drainage from shingles vesiciles    Past Medical History:  Diagnosis Date   Acid reflux    Alcohol abuse    6-8 cans of beer daily; hx of incarceration for DWI   Arthritis    Bipolar disorder ([facility])    Bronchitis    Chronic back pain    Chronic neck pain    Chronic pain    Diabetes mellitus without complication ([place])    Fracture of lower leg    Gout    Hyperlipidemia    Hypertension    Left arm pain    chronic   Migraine    Pancreatitis    March 2013   Pseudocyst of pancreas 02/28/2012   Substance abuse [facility])     Patient Active Problem List   Diagnosis Date Noted   Substance induced mood disorder ([place]) 01/19/2021   Alcohol use disorder, severe, dependence ([place]) 01/05/2021   Cocaine use disorder, moderate, dependence ([place]) 01/05/2021   Depression 01/05/2021   Type 2 diabetes mellitus with diabetic neuropathy, without long-term current use of insulin ([place]) 09/21/2020   Septic arthritis ([place]) 08/09/2019   S/P right knee arthroscopy 07/29/2019 08/03/2019   Acute lateral meniscus tear of right knee    Acute medial meniscus tear of right knee    Substance use disorder 03/16/2019   History of diabetes mellitus 10/10/2016   Avascular necrosis of hip, left ([place]) 10/04/2016   Status post left hip replacement 10/04/2016   Rash and nonspecific skin eruption 08/12/2013   Cholelithiases 08/04/2013   Subcutaneous nodule  19/41/7408   Acute alcoholic pancreatitis 14/48/1856   Syncope 04/11/2013   Prolonged Q-T interval on ECG 04/11/2013   Cocaine abuse ([place]) 03/13/2013    Class: Chronic   At risk for adverse drug event 02/14/2013   DDD (degenerative disc disease), lumbar 02/01/2013   Dyspepsia 12/01/2012   BPH (benign prostatic hyperplasia) 10/07/2012   Allergic rhinitis 10/03/2012   Transaminitis 10/02/2012   Chronic pain syndrome 10/02/2012   Essential hypertension, benign 07/01/2012   Tobacco user 07/01/2012   Hepatic steatosis 03/06/2012   ED (erectile dysfunction) 03/06/2012   Pseudocyst of pancreas 02/28/2012   Alcohol abuse 02/25/2012   GERD (gastroesophageal reflux disease) 02/23/2012   Bipolar 1 disorder ([place]) 02/23/2012   Hypertriglyceridemia 12/05/2011    Past Surgical History:  Procedure Laterality Date   CIRCUMCISION  03/17/2012   Procedure: CIRCUMCISION ADULT;  Surgeon: Marissa Nestle, MD;  Location: [facility];  Service: Urology;  Laterality: N/A;   COLONOSCOPY WITH PROPOFOL  12/24/2012   DJS:HFWY lesion as described above status post biopsy; otherwise normal rectum/Sigmoid diverticulosis/Sigmoid polyps -- resected as described above. anal lesion, benign. colon polyp, hyperplastic   ESOPHAGOGASTRODUODENOSCOPY (EGD) WITH PROPOFOL  12/24/2012   OVZ:CHYIFOY erythema and erosions of uncertain significance status post gastric biopsy (reactive gastropathy NO h.pylori)   KNEE ARTHROSCOPY Right 08/09/2019  Procedure: arthroscopic incision and drainage snovectomy;  Surgeon: Paralee Cancel, MD;  Location: [facility];  Service: Orthopedics;  Laterality: Right;   KNEE ARTHROSCOPY WITH MEDIAL MENISECTOMY Right 07/29/2019   Procedure: RIGHT KNEE ARTHROSCOPY WITH MEDIAL MENISCECTOMY AND LATERAL MENISCECTOMY;  Surgeon: Carole Civil, MD;  Location: [facility];  Service: Orthopedics;  Laterality: Right;   NOSE SURGERY     broken nose   TOTAL HIP ARTHROPLASTY Left 10/04/2016   Procedure: LEFT TOTAL HIP  ARTHROPLASTY ANTERIOR APPROACH;  Surgeon: Mcarthur Rossetti, MD;  Location: [facility];  Service: Orthopedics;  Laterality: Left;       Home Medications    Prior to Admission medications   Medication Sig Start Date End Date Taking? Authorizing Provider  gabapentin (NEURONTIN) 400 MG capsule Take 1 capsule (400 mg total) by mouth 3 (three) times daily. 07/03/21  Yes Scot Jun, FNP  triamcinolone cream (KENALOG) 0.1 % Apply 1 application topically 2 (two) times daily. 07/03/21  Yes Scot Jun, FNP  amLODipine (NORVASC) 5 MG tablet Take 1 tablet (5 mg total) by mouth daily. Patient not taking: Reported on 05/18/2021 05/05/21   Money, Lowry Ram, FNP  amLODipine-benazepril (LOTREL) 5-10 MG capsule Take 1 capsule by mouth daily. 05/17/21   [provider]  benazepril (LOTENSIN) 10 MG tablet Take 1 tablet (10 mg total) by mouth daily. Patient not taking: Reported on 05/18/2021 05/05/21   Money, Lowry Ram, FNP  citalopram (CELEXA) 20 MG tablet Take 1 tablet (20 mg total) by mouth daily. 05/04/21   Money, Lowry Ram, FNP  fluticasone (FLONASE) 50 MCG/ACT nasal spray Place 1 spray into both nostrils daily as needed for allergies or rhinitis.    [provider]  glipiZIDE (GLUCOTROL) 5 MG tablet Take 1 tablet (5 mg total) by mouth daily. 05/04/21   Money, Lowry Ram, FNP  guaiFENesin (MUCINEX) 600 MG 12 hr tablet Take 600 mg by mouth 2 (two) times daily as needed for cough or to loosen phlegm.    [provider]  insulin glargine (LANTUS) 100 UNIT/ML injection Inject 0.2 mLs (20 Units total) into the skin daily. Patient taking differently: Inject 2-8 Units into the skin daily as needed. Sliding scale 2-8 05/05/21   Money, Lowry Ram, FNP  insulin lispro (HUMALOG) 100 UNIT/ML injection Inject 20 Units into the skin daily as needed for high blood sugar.    [provider]  levocetirizine (XYZAL) 5 MG tablet Take 5 mg by mouth daily. 05/17/21   [provider]   lisinopril (ZESTRIL) 10 MG tablet Take 10 mg by mouth daily.    [provider]  loratadine (CLARITIN) 10 MG tablet Take 1 tablet (10 mg total) by mouth daily. Patient taking differently: Take 10 mg by mouth daily as needed for allergies. 05/05/21   Money, Lowry Ram, FNP  metFORMIN (GLUCOPHAGE) 1000 MG tablet Take 1,000 mg by mouth 2 (two) times daily with a meal.    [provider]  metFORMIN (GLUCOPHAGE) 500 MG tablet Take 2 tablets (1,000 mg total) by mouth 2 (two) times daily with a meal. Patient not taking: Reported on 05/18/2021 05/04/21   Money, Lowry Ram, FNP  OLANZapine (ZYPREXA) 20 MG tablet Take 20 mg by mouth in the morning and at bedtime.    [provider]  omeprazole (PRILOSEC) 40 MG capsule Take 40 mg by mouth daily.    [provider]  ondansetron (ZOFRAN-ODT) 4 MG disintegrating tablet Take 4 mg by mouth every 8 (eight) hours  as needed for nausea or vomiting.    [provider]  oxyCODONE (OXY IR/ROXICODONE) 5 MG immediate release tablet Take 5 mg by mouth every 6 (six) hours as needed for severe pain or moderate pain.    [provider]  oxymetazoline (AFRIN) 0.05 % nasal spray Place 1 spray into both nostrils 2 (two) times daily as needed for congestion.    [provider]  pantoprazole (PROTONIX) 40 MG tablet Take 1 tablet (40 mg total) by mouth daily. 05/05/21   Money, Lowry Ram, FNP  tamsulosin (FLOMAX) 0.4 MG CAPS capsule Take 1 capsule (0.4 mg total) by mouth daily. 05/04/21   Money, Lowry Ram, FNP  traZODone (DESYREL) 100 MG tablet Take 100 mg by mouth at bedtime as needed for sleep.    [provider]  traZODone (DESYREL) 50 MG tablet Take 1 tablet (50 mg total) by mouth at bedtime as needed for sleep. Patient not taking: Reported on 05/18/2021 05/04/21   Money, Lowry Ram, FNP  VASCEPA 1 g capsule Take 2 capsules (2 g total) by mouth 2 (two) times daily. 05/04/21   Money, Lowry Ram, FNP    Family History Family  History  Problem Relation Age of Onset   Diabetes Father    Healthy Mother    Anesthesia problems Neg Hx    Hypotension Neg Hx    Malignant hyperthermia Neg Hx    Pseudochol deficiency Neg Hx    Colon cancer Neg Hx    Heart disease Neg Hx    Stroke Neg Hx    Cancer Neg Hx     Social History Social History   Tobacco Use   Smoking status: Every Day    Packs/day: 0.50    Years: 25.00    Pack years: 12.50    Types: Cigarettes   Smokeless tobacco: Former    Types: Chew    Quit date: 12/23/2014  Vaping Use   Vaping Use: Never used  Substance Use Topics   Alcohol use: Yes    Comment: heavily   Drug use: Yes    Types: Cocaine, Marijuana     Allergies   Bee venom and Penicillins   Review of Systems Review of Systems Pertinent negatives listed in HPI   Physical Exam Triage Vital Signs ED Triage Vitals [07/03/21 1909]  Enc Vitals Group     BP      Pulse      Resp      Temp      Temp src      SpO2      Weight      Height      Head Circumference      Peak Flow      Pain Score 9     Pain Loc      Pain Edu?      Excl. in [place]?    No data found.  Updated Vital Signs There were no vitals taken for this visit.  Visual Acuity Right Eye Distance:   Left Eye Distance:   Bilateral Distance:    Right Eye Near:   Left Eye Near:    Bilateral Near:     Physical Exam General appearance: Alert, cooperative,no distress Head: Normocephalic, without obvious abnormality, atraumatic Respiratory: Respirations even and unlabored, normal respiratory rate Heart: Rate and rhythm normal.  Skin: vesicular zoster form rash left lower back distributed toward left hip Neurologic: No focal deficit   UC Treatments / Results  Labs (all labs ordered are  listed, but only abnormal results are displayed) Labs Reviewed - No data to display  EKG   Radiology No results found.  Procedures Procedures (including critical care time)  Medications Ordered in UC Medications   ketorolac (TORADOL) injection 60 mg (60 mg Intramuscular Given 07/03/21 1950)    Initial Impression / Assessment and Plan / UC Course  I have reviewed the triage vital signs and the nursing notes.  Pertinent labs & imaging results that were available during my care of the patient were reviewed by me and considered in my medical decision making (see chart for details).      Shingles rash with delayed healing.Toradol 6 mg IM given. Triamcinolone cream and short 10 days supply of gabapentin prescribed. Follow-up with PCP. Pt reports an upcoming appt within 2 weeks with PCP.  Final Clinical Impressions(s) / UC Diagnoses   Final diagnoses:  Herpes zoster without complication   Discharge Instructions   None    ED Prescriptions     Medication Sig Dispense Auth. Provider   gabapentin (NEURONTIN) 400 MG capsule Take 1 capsule (400 mg total) by mouth 3 (three) times daily. 30 capsule Scot Jun, FNP   triamcinolone cream (KENALOG) 0.1 % Apply 1 application topically 2 (two) times daily. 250 g Scot Jun, FNP      PDMP not reviewed this encounter.   Scot Jun, [place] 07/08/21 2054

## 2021-08-13 ENCOUNTER — Ambulatory Visit: Payer: Medicare Other | Admitting: Orthopedic Surgery

## 2021-08-13 ENCOUNTER — Encounter: Payer: Self-pay | Admitting: Orthopedic Surgery

## 2021-08-15 ENCOUNTER — Other Ambulatory Visit: Payer: Self-pay | Admitting: Orthopedic Surgery

## 2021-09-14 ENCOUNTER — Other Ambulatory Visit: Payer: Self-pay | Admitting: Orthopedic Surgery

## 2022-10-01 IMAGING — MR MR SHOULDER*R* W/O CM
5 series · 40 of 40 positions shown · non-contrast
Comparison: Right shoulder x-rays dated November 02, 2020.

CLINICAL DATA: Right shoulder pain.

EXAM:
MRI OF THE RIGHT SHOULDER WITHOUT CONTRAST
TECHNIQUE: Multiplanar, multisequence MR imaging of the shoulder was performed.
No intravenous contrast was administered.

[Series 5: T2 fat-sat · axial · right · 4.0mm · 0.47mm/px · z∈[-68,+46]mm · 8 of 26 slices shown (1 of 3)]
[im 1/26]
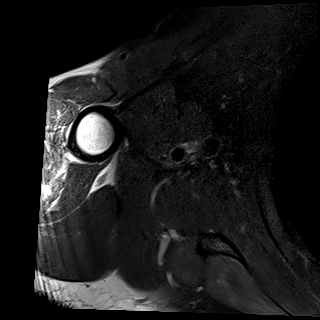
[im 4/26]
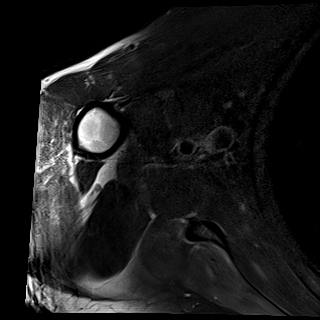
[im 8/26]
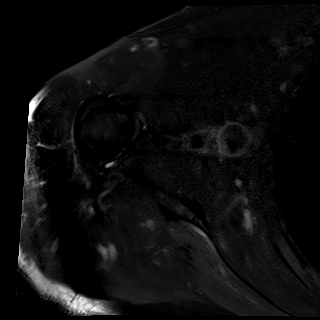
[im 11/26]
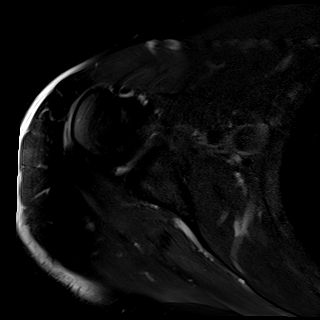
[im 15/26]
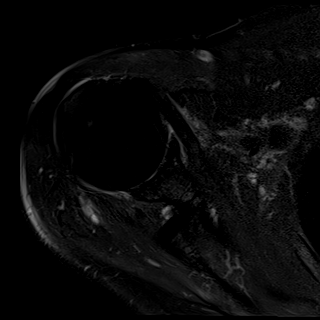
[im 18/26]
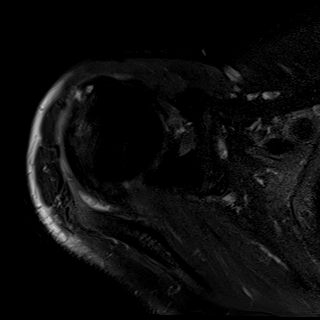
[im 22/26]
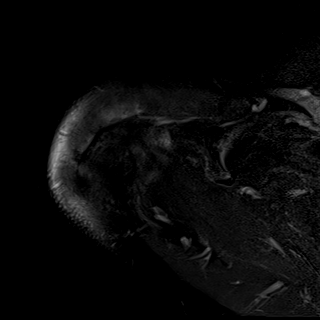
[im 26/26]
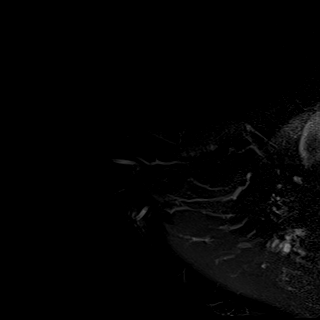

[Series 6: T2 fat-sat · oblique · right · 4.0mm · 0.47mm/px · 8 of 26 slices shown (2 of 3)]
[im 1/26]
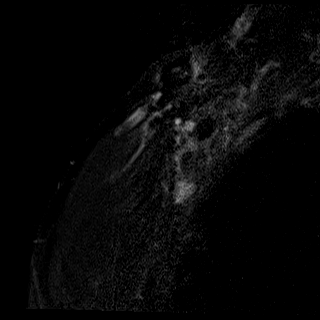
[im 4/26]
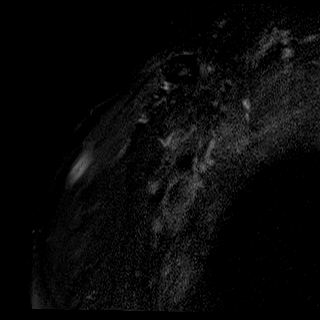
[im 8/26]
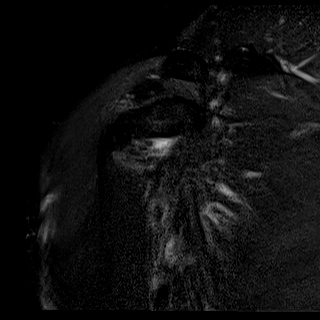
[im 11/26]
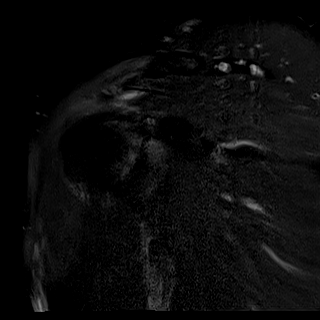
[im 15/26]
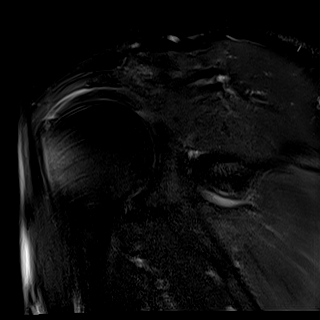
[im 18/26]
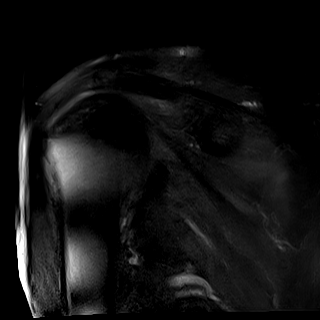
[im 22/26]
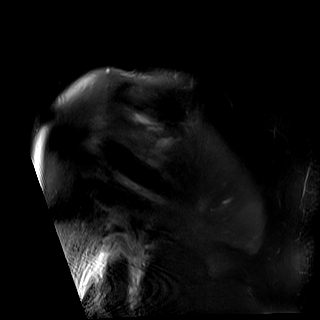
[im 26/26]
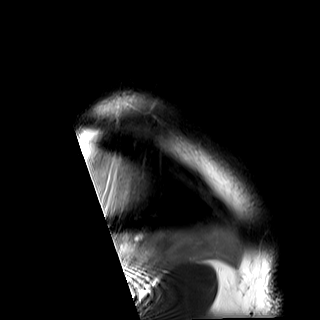

[Series 7: PD · oblique · right · 4.0mm · 0.47mm/px · 8 of 26 slices shown]
[im 1/26]
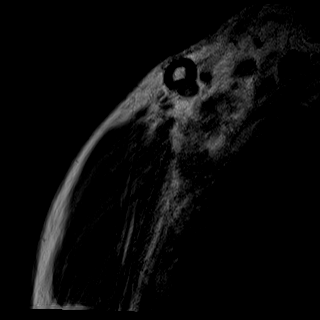
[im 4/26]
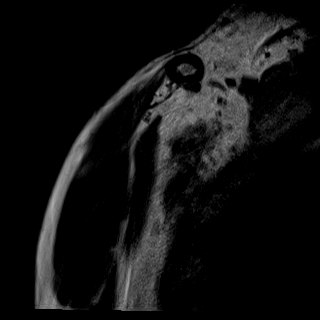
[im 8/26]
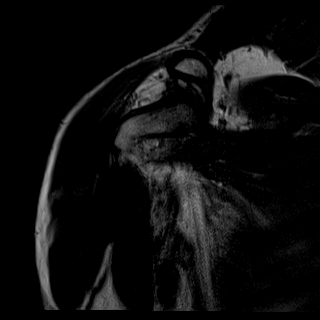
[im 11/26]
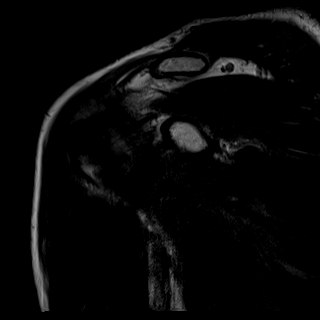
[im 15/26]
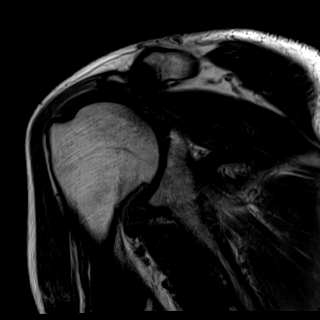
[im 18/26]
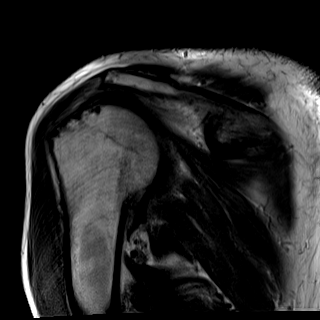
[im 22/26]
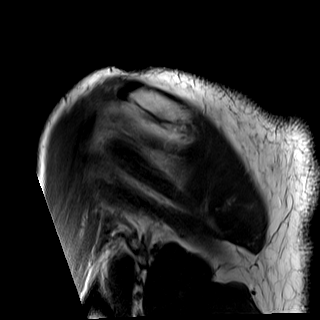
[im 26/26]
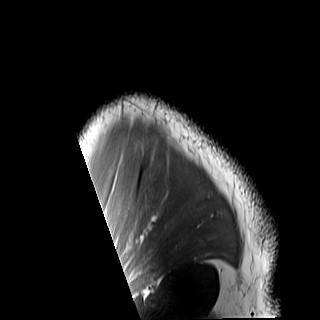

[Series 8: T2 fat-sat · oblique · right · 4.0mm · 0.44mm/px · 8 of 25 slices shown (3 of 3)]
[im 1/25]
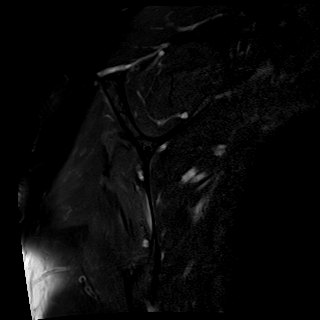
[im 4/25]
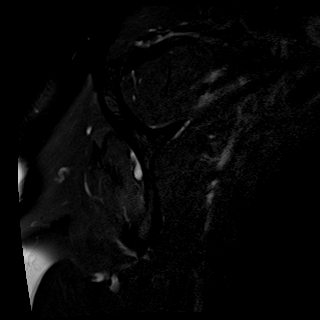
[im 7/25]
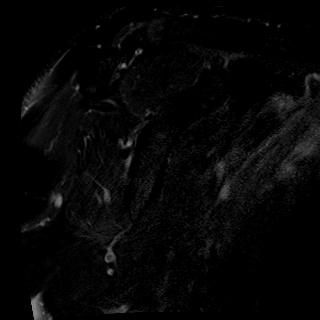
[im 11/25]
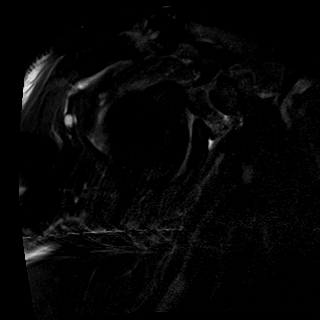
[im 14/25]
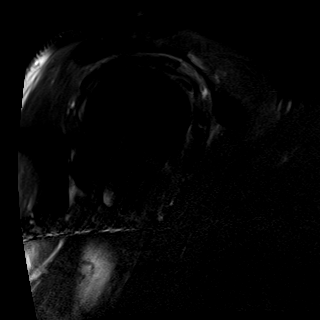
[im 18/25]
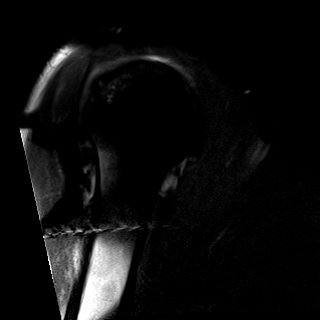
[im 21/25]
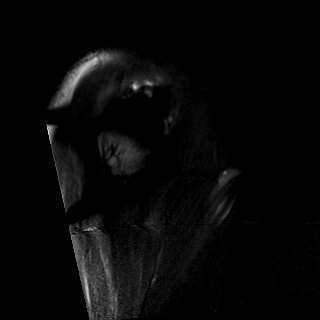
[im 25/25]
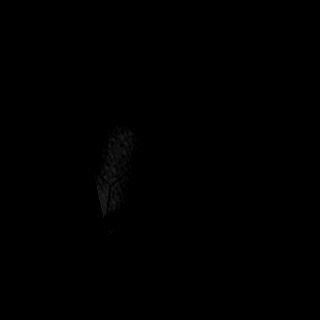

[Series 9: T1 · oblique · right · 4.0mm · 0.41mm/px · 8 of 25 slices shown]
[im 1/25]
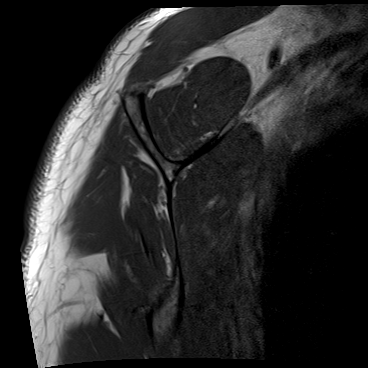
[im 4/25]
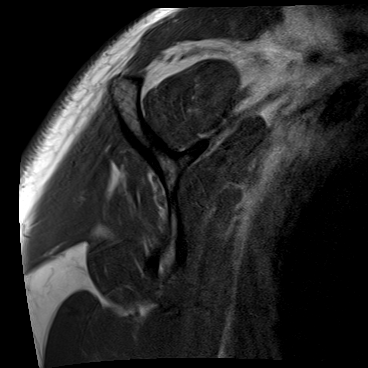
[im 7/25]
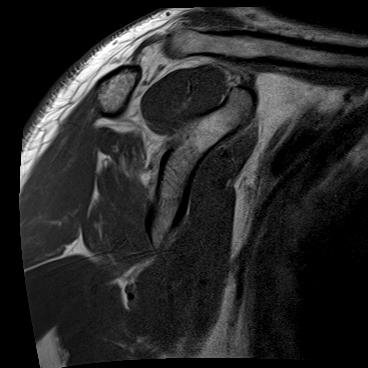
[im 11/25]
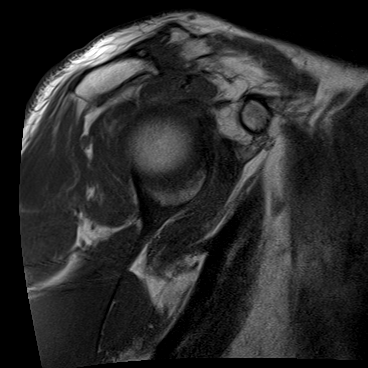
[im 14/25]
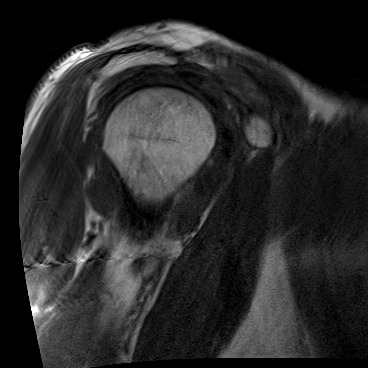
[im 18/25]
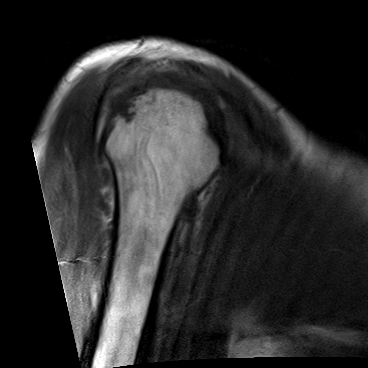
[im 21/25]
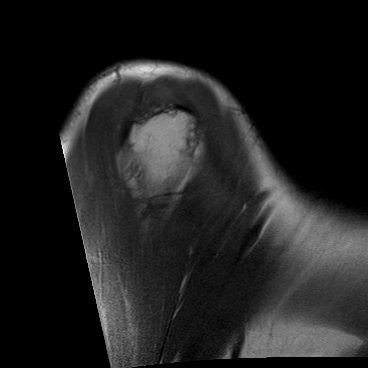
[im 25/25]
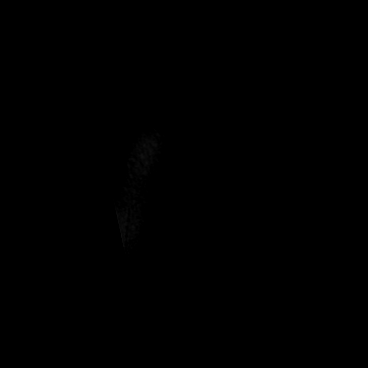

[40 of 40 positions shown; findings below may reference images not displayed]

FINDINGS: Despite efforts by the technologist and patient, motion artifact is
present on today's exam and could not be eliminated. This reduces
exam sensitivity and specificity.

Rotator cuff: Mild supraspinatus and infraspinatus tendinosis with
focal high-grade partial-thickness bursal surface tear of the
conjoined distal tendon fibers at the insertion, extending
posteriorly into the infraspinatus tendon. Mild subscapularis
tendinosis. The teres minor tendon is unremarkable.

Muscles:  Mild infraspinatus muscle atrophy.  No muscle edema.

Biceps long head:  Intact and normally positioned.

Acromioclavicular Joint: Mild to moderate arthropathy of the
acromioclavicular joint. Type II acromion. Trace
subacromial/subdeltoid bursal fluid.

Glenohumeral Joint: Mild diffuse cartilage thinning without focal
defect. No joint effusion.

Labrum: Grossly intact, but evaluation is limited by lack of
intraarticular fluid.

Bones:  No marrow abnormality, fracture or dislocation.

Other: None.
IMPRESSION: 1. Focal high-grade partial-thickness bursal surface tear of the
conjoined distal supraspinatus and infraspinatus tendon fibers at
the insertion, extending posteriorly into the infraspinatus tendon.
Mild infraspinatus muscle atrophy.
2. Mild to moderate acromioclavicular and mild glenohumeral
osteoarthritis.

## 2022-12-04 ENCOUNTER — Encounter: Payer: Self-pay | Admitting: *Deleted

## 2022-12-11 ENCOUNTER — Ambulatory Visit: Payer: Self-pay

## 2022-12-11 NOTE — Telephone Encounter (Signed)
  Pt. Asking if he can receive a flu vaccine while he has cold symptoms. No fever. Instructed he can. Verbalizes understanding.  Answer Assessment - Initial Assessment Questions 1. REASON FOR CALL: "What is your main concern right now?"     Can I get the flu vaccine? 2. ONSET: "When did the  start?"     N/a 3. SEVERITY: "How bad is the ?"     N/a 4. FEVER: "Do you have a fever?"     No 5. OTHER SYMPTOMS: "Do you have any other new symptoms?"     Cold symptoms 6. TREATMENTS AND RESPONSE: "What have you done so far to try to make this better? What medicines have you used?"     N/a 7. PREGNANCY: "Is there any chance you are pregnant?" "When was your last menstrual period?"     N/a  Protocols used: No Guideline Available-[facility]

## 2023-07-07 ENCOUNTER — Emergency Department (HOSPITAL_COMMUNITY): Payer: 59

## 2023-07-07 ENCOUNTER — Ambulatory Visit (HOSPITAL_COMMUNITY)
Admission: EM | Admit: 2023-07-07 | Discharge: 2023-07-07 | Disposition: A | Discharge status: Home / Self Care | Payer: 59 | Source: Home / Self Care

## 2023-07-07 ENCOUNTER — Emergency Department (HOSPITAL_COMMUNITY)
Admission: EM | Admit: 2023-07-07 | Discharge: 2023-07-07 | Disposition: A | Discharge status: Home / Self Care | Payer: 59 | Attending: Emergency Medicine | Admitting: Emergency Medicine

## 2023-07-07 ENCOUNTER — Other Ambulatory Visit: Payer: Self-pay

## 2023-07-07 DIAGNOSIS — F1910 Other psychoactive substance abuse, uncomplicated: Secondary | ICD-10-CM | POA: Insufficient documentation

## 2023-07-07 DIAGNOSIS — F1290 Cannabis use, unspecified, uncomplicated: Secondary | ICD-10-CM | POA: Insufficient documentation

## 2023-07-07 DIAGNOSIS — F1410 Cocaine abuse, uncomplicated: Secondary | ICD-10-CM | POA: Insufficient documentation

## 2023-07-07 DIAGNOSIS — Z794 Long term (current) use of insulin: Secondary | ICD-10-CM | POA: Insufficient documentation

## 2023-07-07 DIAGNOSIS — F1010 Alcohol abuse, uncomplicated: Secondary | ICD-10-CM | POA: Insufficient documentation

## 2023-07-07 DIAGNOSIS — Z5900 Homelessness unspecified: Secondary | ICD-10-CM | POA: Insufficient documentation

## 2023-07-07 DIAGNOSIS — Z96642 Presence of left artificial hip joint: Secondary | ICD-10-CM | POA: Diagnosis not present

## 2023-07-07 DIAGNOSIS — F319 Bipolar disorder, unspecified: Secondary | ICD-10-CM | POA: Insufficient documentation

## 2023-07-07 DIAGNOSIS — Z79899 Other long term (current) drug therapy: Secondary | ICD-10-CM | POA: Insufficient documentation

## 2023-07-07 DIAGNOSIS — M25552 Pain in left hip: Secondary | ICD-10-CM | POA: Diagnosis present

## 2023-07-07 LAB — COMPREHENSIVE METABOLIC PANEL
ALT: 20 U/L (ref 0–44)
AST: 26 U/L (ref 15–41)
Albumin: 3.9 g/dL (ref 3.5–5.0)
Alkaline Phosphatase: 30 U/L — ABNORMAL LOW (ref 38–126)
Anion gap: 9 (ref 5–15)
BUN: 12 mg/dL (ref 6–20)
CO2: 26 mmol/L (ref 22–32)
Calcium: 9.1 mg/dL (ref 8.9–10.3)
Chloride: 105 mmol/L (ref 98–111)
Creatinine, Ser: 0.99 mg/dL (ref 0.61–1.24)
GFR, Estimated: >60 mL/min (ref >60)
Glucose, Bld: 85 mg/dL (ref 70–99)
Potassium: 3.7 mmol/L (ref 3.5–5.1)
Sodium: 140 mmol/L (ref 135–145)
Total Bilirubin: 0.7 mg/dL (ref 0.3–1.2)
Total Protein: 7.2 g/dL (ref 6.5–8.1)

## 2023-07-07 LAB — RAPID URINE DRUG SCREEN, HOSP PERFORMED
Amphetamines: NOT DETECTED
Barbiturates: NOT DETECTED
Benzodiazepines: POSITIVE — AB
Cocaine: POSITIVE — AB
Opiates: NOT DETECTED
Tetrahydrocannabinol: NOT DETECTED

## 2023-07-07 LAB — CBC
HCT: 45.3 % (ref 39.0–52.0)
Hemoglobin: 14.5 g/dL (ref 13.0–17.0)
MCH: 28.8 pg (ref 26.0–34.0)
MCHC: 32 g/dL (ref 30.0–36.0)
MCV: 89.9 fL (ref 80.0–100.0)
Platelets: 446 10*3/uL — ABNORMAL HIGH (ref 150–400)
RBC: 5.04 MIL/uL (ref 4.22–5.81)
RDW: 15.6 % — ABNORMAL HIGH (ref 11.5–15.5)
WBC: 6.4 10*3/uL (ref 4.0–10.5)
nRBC: 0 % (ref 0.0–0.2)

## 2023-07-07 LAB — ETHANOL: Alcohol, Ethyl (B): <10 mg/dL (ref <10)

## 2023-07-07 MED ORDER — CHLORDIAZEPOXIDE HCL 25 MG PO CAPS
50.0000 mg | ORAL_CAPSULE | Freq: Once | ORAL | Status: AC
  Administered 2023-07-07: 50 mg via ORAL
  Filled 2023-07-07: qty 2

## 2023-07-07 MED ORDER — INSULIN PEN NEEDLE 30G X 8 MM MISC: 1.0000 | 3 refills | Status: AC | PRN

## 2023-07-07 MED ORDER — KETOROLAC TROMETHAMINE 15 MG/ML IJ SOLN
15.0000 mg | Freq: Once | INTRAMUSCULAR | Status: AC
  Administered 2023-07-07: 15 mg via INTRAMUSCULAR
  Filled 2023-07-07: qty 1

## 2023-07-07 MED ORDER — CHLORDIAZEPOXIDE HCL 25 MG PO CAPS: ORAL_CAPSULE | ORAL | 0 refills | Status: DC

## 2023-07-07 NOTE — ED Triage Notes (Signed)
Patient requesting detox from alcohol and cocaine. Drinks approximately a 12 pack of beer daily, and so far has had only one beer today. Uses cocaine every day. Also would like evaluation of L hip pain, ongoing since he had hip replacement in 2017.

## 2023-07-07 NOTE — Discharge Instructions (Signed)
  Discharge recommendations:  Patient is to take medications as prescribed. Please see information for follow-up appointment with psychiatry and therapy. Please follow up with your primary care provider for all medical related needs.   Therapy: We recommend that patient participate in individual therapy to address mental health concerns.  Medications: The patient or guardian is to contact a medical professional and/or outpatient provider to address any new side effects that develop. The patient or guardian should update outpatient providers of any new medications and/or medication changes.   Safety:  The patient should abstain from use of illicit substances/drugs and abuse of any medications. If symptoms worsen or do not continue to improve or if the patient becomes actively suicidal or homicidal then it is recommended that the patient return to the closest hospital emergency department, the [facility], or call 911 for further evaluation and treatment. National Suicide Prevention Lifeline 1-800-SUICIDE or 1-800-273-8255.  About 988 988 offers 24/7 access to trained crisis counselors who can help people experiencing mental health-related distress. People can call or text 988 or chat 988lifeline.org for themselves or if they are worried about a loved one who may need crisis support.  Crisis Mobile: [facility]:                     1-877-626-1772 (for crisis response 24 hours a day) [facility] Hotline:                                            1-800-256-2452  

## 2023-07-07 NOTE — Progress Notes (Signed)
   07/07/23 2245  [facility] Triage Screening (Walk-ins at [facility] only)  How Did You Hear About [place]? Self  What Is the Reason for Your Visit/Call Today? Pt said that he was at [facility] earlier getting his hip checked out.  He said "I wanted to stay somewhere overnight until I get into a detox center."  He said he wanted to go to [facility] in [place] to be able to stay the night.  Pt denies any suicidal thoughts.  Pt says he wants to hurt "anybody" no specific plan.  Pt denies any A/V halluciantions.  He is wanting to detox from ETOH and cocaine.  He says he used both today "it was not much."  No access to guns.  Last detox was at a [facility] in [place] a couple months ago.  Pt says he has no outpatient care at this time.  Pt wanted ot know if he could get transportation to [facility] in [place].  How Long Has This Been Causing You Problems? 1 wk - 1 month  Have You Recently Had Any Thoughts About Hurting Yourself? No  Are You Planning to Commit Suicide/Harm Yourself At This time? No  Have you Recently Had Thoughts About Hurting Someone [name]? Yes  How long ago did you have thoughts of harming others? For the last few minutes.  He is irritated about his situation.  Are You Planning To Harm Someone At This Time? No  Are you currently experiencing any auditory, visual or other hallucinations? No  Have You Used Any Alcohol or Drugs in the Past 24 Hours? Yes  How long ago did you use Drugs or Alcohol? Says he used "a little ETOH and cocaine today.  What Did You Use and How Much? Pt says he used "a little" ETOH and cocaine today.  Do you have any current medical co-morbidities that require immediate attention? No  Clinician description of patient physical appearance/behavior: Pt is irritable about not being able to stay overnight.  He is discheveled.  Pt has crutches and arrived with a lot of bags full of belonging.  He is oriented x4 and maintains good eye contact.  What Do You Feel Would Help You the Most Today?  Alcohol or Drug Use Treatment;Housing Assistance;Financial Resources  If access to [facility] was not available, would you have sought care in the Emergency Department? Yes  Determination of Need Routine (7 days)  Options For Referral Outpatient Therapy

## 2023-07-07 NOTE — ED Provider Notes (Signed)
[place] EMERGENCY DEPARTMENT AT [facility] Provider Note   CSN: [id number] Arrival date & time: 07/07/23  1231     History  Chief Complaint  Patient presents with   Alcohol Problem   Hip Pain   Drug Problem    [name] [name]. is a 62 y.o. male.  62 year old male with a history of alcohol abuse and cocaine abuse who presents to the emergency department due to wishes for detox.  Says that last drink was this morning at 10 AM.  No symptoms currently.  Typically drinks 12 pack of beer a day.  Is requesting detox from alcohol and cocaine.  Says that he is having left hip pain.  Had a replacement in 2017.  No trauma or injuries noted.  Does have a history of multiple presentations to the emergency department for this.        Home Medications Prior to Admission medications   Medication Sig Start Date End Date Taking? Authorizing Provider  chlordiazePOXIDE (LIBRIUM) 25 MG capsule 50mg  PO TID x 1D, then 25-50mg  PO BID X 1D, then 25-50mg  PO QD X 1D 07/07/23  Yes Rondel Baton, MD  Insulin Pen Needle (NOVOFINE) 30G X 8 MM MISC Inject 10 each into the skin as needed. 07/07/23 08/06/23 Yes Rondel Baton, MD  amLODipine (NORVASC) 5 MG tablet Take 1 tablet (5 mg total) by mouth daily. Patient not taking: Reported on 05/18/2021 05/05/21   Money, Gerlene Burdock, FNP  amLODipine-benazepril (LOTREL) 5-10 MG capsule Take 1 capsule by mouth daily. 05/17/21   [provider]  benazepril (LOTENSIN) 10 MG tablet Take 1 tablet (10 mg total) by mouth daily. Patient not taking: Reported on 05/18/2021 05/05/21   Money, Gerlene Burdock, FNP  citalopram (CELEXA) 20 MG tablet Take 1 tablet (20 mg total) by mouth daily. 05/04/21   Money, Gerlene Burdock, FNP  fluticasone (FLONASE) 50 MCG/ACT nasal spray Place 1 spray into both nostrils daily as needed for allergies or rhinitis.    [provider]  gabapentin (NEURONTIN) 400 MG capsule TAKE 1 CAPSULE BY MOUTH THREE TIMES A DAY. 09/17/21   Vickki Hearing, MD  glipiZIDE (GLUCOTROL) 5 MG tablet Take 1 tablet (5 mg total) by mouth daily. 05/04/21   Money, Gerlene Burdock, FNP  guaiFENesin (MUCINEX) 600 MG 12 hr tablet Take 600 mg by mouth 2 (two) times daily as needed for cough or to loosen phlegm.    [provider]  insulin glargine (LANTUS) 100 UNIT/ML injection Inject 0.2 mLs (20 Units total) into the skin daily. Patient taking differently: Inject 2-8 Units into the skin daily as needed. Sliding scale 2-8 05/05/21   Money, Gerlene Burdock, FNP  insulin lispro (HUMALOG) 100 UNIT/ML injection Inject 20 Units into the skin daily as needed for high blood sugar.    [provider]  levocetirizine (XYZAL) 5 MG tablet Take 5 mg by mouth daily. 05/17/21   [provider]  lisinopril (ZESTRIL) 10 MG tablet Take 10 mg by mouth daily.    [provider]  loratadine (CLARITIN) 10 MG tablet Take 1 tablet (10 mg total) by mouth daily. Patient taking differently: Take 10 mg by mouth daily as needed for allergies. 05/05/21   Money, Gerlene Burdock, FNP  metFORMIN (GLUCOPHAGE) 1000 MG tablet Take 1,000 mg by mouth 2 (two) times daily with a meal.    [provider]  metFORMIN (GLUCOPHAGE) 500 MG tablet Take 2 tablets (1,000 mg total) by mouth 2 (two) times  daily with a meal. Patient not taking: Reported on 05/18/2021 05/04/21   Money, Gerlene Burdock, FNP  OLANZapine (ZYPREXA) 20 MG tablet Take 20 mg by mouth in the morning and at bedtime.    [provider]  omeprazole (PRILOSEC) 40 MG capsule Take 40 mg by mouth daily.    [provider]  ondansetron (ZOFRAN-ODT) 4 MG disintegrating tablet Take 4 mg by mouth every 8 (eight) hours as needed for nausea or vomiting.    [provider]  oxyCODONE (OXY IR/ROXICODONE) 5 MG immediate release tablet Take 5 mg by mouth every 6 (six) hours as needed for severe pain or moderate pain.    [provider]  oxymetazoline (AFRIN) 0.05 % nasal spray Place 1 spray into both  nostrils 2 (two) times daily as needed for congestion.    [provider]  pantoprazole (PROTONIX) 40 MG tablet Take 1 tablet (40 mg total) by mouth daily. 05/05/21   Money, Gerlene Burdock, FNP  tamsulosin (FLOMAX) 0.4 MG CAPS capsule Take 1 capsule (0.4 mg total) by mouth daily. 05/04/21   Money, Gerlene Burdock, FNP  traZODone (DESYREL) 100 MG tablet Take 100 mg by mouth at bedtime as needed for sleep.    [provider]  traZODone (DESYREL) 50 MG tablet Take 1 tablet (50 mg total) by mouth at bedtime as needed for sleep. Patient not taking: Reported on 05/18/2021 05/04/21   Money, Gerlene Burdock, FNP  triamcinolone cream (KENALOG) 0.1 % Apply 1 application topically 2 (two) times daily. 07/03/21   Bing Neighbors, NP  VASCEPA 1 g capsule Take 2 capsules (2 g total) by mouth 2 (two) times daily. 05/04/21   Money, Gerlene Burdock, FNP      Allergies    Bee venom and Penicillins    Review of Systems   Review of Systems  Physical Exam Updated Vital Signs BP 128/79 (BP Location: Right Arm)   Pulse 74   Temp 97.9 F (36.6 C) (Oral)   Resp 16   SpO2 94%  Physical Exam Vitals and nursing note reviewed.  Constitutional:      General: He is not in acute distress.    Appearance: He is well-developed.  HENT:     Head: Normocephalic and atraumatic.     Right Ear: External ear normal.     Left Ear: External ear normal.     Nose: Nose normal.  Eyes:     Extraocular Movements: Extraocular movements intact.     Conjunctiva/sclera: Conjunctivae normal.     Pupils: Pupils are equal, round, and reactive to light.  Cardiovascular:     Rate and Rhythm: Normal rate and regular rhythm.  Pulmonary:     Effort: Pulmonary effort is normal. No respiratory distress.  Musculoskeletal:     Cervical back: Normal range of motion and neck supple.     Right lower leg: No edema.     Left lower leg: No edema.     Comments: No tenderness palpation of left hip.  Full range of motion.  No effusion noted on left hip.   Skin:    General: Skin is warm and dry.  Neurological:     Mental Status: He is alert. Mental status is at baseline.     Comments: No tremors or tongue fasciculations  Psychiatric:        Mood and Affect: Mood normal.        Behavior: Behavior normal.     ED Results / Procedures / Treatments  Labs (all labs ordered are listed, but only abnormal results are displayed) Labs Reviewed  COMPREHENSIVE METABOLIC PANEL - Abnormal; Notable for the following components:      Result Value   Alkaline Phosphatase 30 (*)    All other components within normal limits  CBC - Abnormal; Notable for the following components:   RDW 15.6 (*)    Platelets 446 (*)    All other components within normal limits  RAPID URINE DRUG SCREEN, HOSP PERFORMED - Abnormal; Notable for the following components:   Cocaine POSITIVE (*)    Benzodiazepines POSITIVE (*)    All other components within normal limits  ETHANOL    EKG None  Radiology DG Hip Unilat W or Wo Pelvis 2-3 Views Left  Result Date: 07/07/2023 CLINICAL DATA:  Chronic left hip pain. EXAM: DG HIP (WITH OR WITHOUT PELVIS) 2-3V LEFT COMPARISON:  April 09, 2018. FINDINGS: Status post left total hip arthroplasty. No fracture or dislocation is noted. IMPRESSION: No acute abnormality seen. Electronically Signed   By: Lupita Raider M.D.   On: 07/07/2023 14:19    Procedures Procedures    Medications Ordered in ED Medications  ketorolac (TORADOL) 15 MG/ML injection 15 mg (has no administration in time range)  chlordiazePOXIDE (LIBRIUM) capsule 50 mg (50 mg Oral Given 07/07/23 1836)    ED Course/ Medical Decision Making/ A&P                             Medical Decision Making Amount and/or Complexity of Data Reviewed Labs: ordered. Radiology: ordered.  Risk OTC drugs. Prescription drug management.   Iden Stripling. is a 62 y.o. male with comorbidities that complicate the patient evaluation including alcohol and cocaine abuse presents  emergency department with request for detox and chronic hip pain  Initial Ddx:  Preprostatic fracture, septic joint, alcohol withdrawals, cocaine withdrawals, intoxication  MDM/Course:  Patient presents to the emergency department with request for detox.  Not having any significant symptoms that he is reporting at this time.  No tremors or tongue fasciculations or significant hemodynamic instability to suggest severe withdrawals.  Does not appear to be currently intoxicated.  Was given a dose of Librium in the emergency department and a prescription to take this at home since he is interested in stopping alcohol at this time.  Will have him follow-up with behavioral health urgent care for additional resources.  Did have an x-ray that was sent from triage for his hip pain that did not show any acute abnormalities.  Does still have full range of motion and so no concern for septic joint.  Has had multiple visits to the emergency department for similar symptoms.  Upon re-evaluation patient remained stable.  Will have him follow-up with his primary doctor in several days.  Was also given insulin pen needles which he says that he is run out of for his insulin.  This patient presents to the ED for concern of complaints listed in HPI, this involves an extensive number of treatment options, and is a complaint that carries with it a high risk of complications and morbidity. Disposition including potential need for admission considered.   Dispo: DC Home. Return precautions discussed including, but not limited to, those listed in the AVS. Allowed pt time to ask questions which were answered fully prior to dc.  Records reviewed ED Visit Notes The following labs were independently interpreted: Chemistry and show no acute abnormality I independently  reviewed the following imaging with scope of interpretation limited to determining acute life threatening conditions related to emergency care: Extremity x-ray(s) and  agree with the radiologist interpretation with the following exceptions: none I personally reviewed and interpreted cardiac monitoring: normal sinus rhythm  I have reviewed the patients home medications and made adjustments as needed Social Determinants of health:  Substance abuse       Final Clinical Impression(s) / ED Diagnoses Final diagnoses:  Alcohol abuse  Pain of left hip    Rx / DC Orders ED Discharge Orders          Ordered    chlordiazePOXIDE (LIBRIUM) 25 MG capsule        07/07/23 1957    Insulin Pen Needle (NOVOFINE) 30G X 8 MM MISC  As needed        07/07/23 1959              Rondel Baton, MD 07/07/23 2348

## 2023-07-07 NOTE — Discharge Instructions (Signed)
You were seen for your hip pain in the emergency department.   At home, please continue the muscle relaxer you were given.    Take the Librium for alcohol withdrawals.  Do not use this with alcohol.  Check your MyChart online for the results of any tests that had not resulted by the time you left the emergency department.   Follow-up with your primary doctor in 2-3 days regarding your visit.  Follow-up with behavioral health urgent care regarding detox.  Follow-up with your orthopedic doctor regarding your hip pain  Return immediately to the emergency department if you experience any of the following: Severe withdrawals, or any other concerning symptoms.    Thank you for visiting our Emergency Department. It was a pleasure taking care of you today.

## 2023-07-07 NOTE — ED Notes (Signed)
Pt agitated, requesting pain medication and doctor to go over x-ray results prior to discharge.

## 2023-07-07 NOTE — ED Provider Notes (Signed)
Behavioral Health Urgent Care Medical Screening Exam  Patient Name: [name] [name]. MRN: [id number] Date of Evaluation: 07/07/23 Chief Complaint:  "I need a 30-day detox treatment". Diagnosis:  Final diagnoses:  Polysubstance abuse (HCC)  Cocaine abuse (HCC)  Homelessness    History of Present illness: [name] [name]. is a 62 y.o. male.  With psychiatric history of alcohol abuse, bipolar disorder, cocaine abuse, tobacco use, depression, and substance-induced mood disorder, who presented voluntarily as a walk-in to [facility] requesting shelter for night and a 30-day detox treatment from alcohol and cocaine.  Patient was seen face-to-face by this provider and chart reviewed. Per chart review, patient has a history of polysubstance abuse and reports he has been to rehab/detox programs  "a couple of times". Patient has a history of multiple presentations to the emergency department for left hip pain and detox. Had a replacement in 2017"   On evaluation, patient is alert, oriented x 4, and cooperative. Speech is clear, normal rate and coherent. Pt appears casual. Eye contact is fair. Mood is irritable, affect is blunt and congruent with mood. Thought process is goal directed and thought content is WNL. Pt denies SI/HI/AVH or paranoia. There is no objective indication that the patient is responding to internal stimuli. No delusions elicited during this assessment.    Patient reports "I just went to ED to get checked out to make sure I was alright before I get detoxed for alcohol and cocaine". Patient has an irritated tone of voice and endorses drinking 12 pack/day of beer and a pint of liquor and reports he drank a beer today.  Patient also reports using half a gram of cocaine daily, last used on Sunday.  Patient also reports smoking a joint of marijuana daily. Patient denies a history of alcohol withdrawal seizures.  He is also denying any current withdrawal symptoms. Patient is noted to have a  crutch, but was seen walking to and from the bathroom independently with a steady gait.  At this point in the evaluation, patient presents as irritable and snappy when answering questions.  He reports he was at a detox center in [place] a month ago and relapsed a week after he was discharged.  Patient reports "I always relapse a week between treatments, but I think it'll be best if I go to a 30 day program".  Patient reports he is on disability and lives "anywhere". He reports his appetite and sleep as good.   Patient declined to answer any further evaluation questions and asked "can I get something to eat?, I need a place to stay tonight, can I at least stay here for the night and I'll go to [facility] in [facility]".  Support, encouragement and reassurance provided about ongoing stressors. Patient is provided with opportunity for questions.   Discussed recommendation for discharge and follow up with outpatient psychiatric services for SAIOP or 30 day detox for AUD per his request.    Flowsheet Row ED from 07/07/2023 in [facility] Emergency Department at [facility] ED from 07/03/2021 in [facility] at [facility] ED from 05/17/2021 in [facility] Emergency Department at [facility]  C-SSRS RISK CATEGORY No Risk No Risk Error: Q3, 4, or 5 should not be populated when Q2 is No       Psychiatric Specialty Exam  Presentation  General Appearance:Casual  Eye Contact:Fair  Speech:Clear and Coherent  Speech Volume:Normal  Handedness:Right   Mood and Affect  Mood: Irritable  Affect: Congruent  Thought Process  Thought Processes: Goal Directed  Descriptions of Associations:Intact  Orientation:Full (Time, Place and Person)  Thought Content:WDL  Diagnosis of Schizophrenia or Schizoaffective disorder in past: No data recorded Duration of Psychotic Symptoms: No data recorded Hallucinations:None  Ideas of Reference:None  Suicidal  Thoughts:No  Homicidal Thoughts:No   Sensorium  Memory: Immediate Fair  Judgment: Poor  Insight: Lacking   Executive Functions  Concentration: Good  Attention Span: Good  Recall: Good  Fund of Knowledge: Good  Language: Good   Psychomotor Activity  Psychomotor Activity: Normal   Assets  Assets: Communication Skills; Desire for Improvement   Sleep  Sleep: Good  Number of hours: No data recorded  Physical Exam: Physical Exam Constitutional:      General: He is not in acute distress.    Appearance: He is not diaphoretic.  HENT:     Head: Normocephalic.     Right Ear: External ear normal.     Left Ear: External ear normal.     Nose: No congestion.  Eyes:     General:        Right eye: No discharge.        Left eye: No discharge.  Cardiovascular:     Rate and Rhythm: Normal rate.  Pulmonary:     Effort: No respiratory distress.  Chest:     Chest wall: No tenderness.  Neurological:     Mental Status: He is alert and oriented to person, place, and time.  Psychiatric:        Attention and Perception: Attention and perception normal.        Mood and Affect: Affect is blunt.        Speech: Speech normal.        Behavior: Behavior is cooperative.        Thought Content: Thought content is not paranoid or delusional. Thought content does not include homicidal or suicidal ideation. Thought content does not include homicidal or suicidal plan.        Cognition and Memory: Cognition and memory normal.    Review of Systems  Constitutional:  Negative for chills, diaphoresis and fever.  HENT:  Negative for congestion.   Eyes:  Negative for discharge.  Respiratory:  Negative for cough, shortness of breath and wheezing.   Cardiovascular:  Negative for chest pain and palpitations.  Gastrointestinal:  Negative for diarrhea, nausea and vomiting.  Neurological:  Negative for dizziness, seizures, loss of consciousness, weakness and headaches.   Psychiatric/Behavioral:  Positive for substance abuse.    Blood pressure (!) 136/90, pulse 73, resp. rate 18, SpO2 97%. There is no height or weight on file to calculate BMI.  Musculoskeletal: Strength & Muscle Tone: within normal limits Gait & Station: normal Patient leans: Patient seen ambulating independently, but has a crutch.   [facility] MSE Discharge Disposition for Follow up and Recommendations: Based on my evaluation the patient does not appear to have an emergency medical condition and can be discharged with resources and follow up care in outpatient services for Substance Abuse Intensive Outpatient Program  Recommend discharge follow-up with outpatient psychiatric services for substance abuse intensive outpatient programs or 30-day inpatient substance abuse treatment programs per patient request.  Patient denies SI/HI/AVH or Paranoia. Patient does not meet inpatient psychiatric admission criteria or IVC criteria at this time.  No evidence of imminent risk of harm to self or others.  Discharge recommendations:  Please follow up with your primary care provider for all medical related needs.   Medications:The  patient or guardian should update outpatient providers of any new medications and/or medication changes.   Safety:  The patient should abstain from use of illicit substances/drugs and abuse of any medications. If symptoms worsen or do not continue to improve or if the patient becomes actively suicidal or homicidal then it is recommended that the patient return to the closest hospital emergency department, the [facility], or call 911 for further evaluation and treatment. National Suicide Prevention Lifeline 1-800-SUICIDE or (952)857-6769.  About 988 988 offers 24/7 access to trained crisis counselors who can help people experiencing mental health-related distress. People can call or text 988 or chat 988lifeline.org for themselves or if they are  worried about a loved one who may need crisis support.  Crisis Mobile: [facility]:                     830-174-6601 (for crisis response 24 hours a day) [facility] Hotline:                                            289-623-0839   Patient discharged in stable condition.  Mancel Bale, NP 07/07/2023, 10:47 PM

## 2023-07-08 ENCOUNTER — Encounter (HOSPITAL_COMMUNITY): Payer: Self-pay

## 2023-07-08 ENCOUNTER — Emergency Department (HOSPITAL_COMMUNITY)
Admission: EM | Admit: 2023-07-08 | Discharge: 2023-07-08 | Disposition: A | Discharge status: Home / Self Care | Payer: 59 | Attending: Emergency Medicine | Admitting: Emergency Medicine

## 2023-07-08 ENCOUNTER — Other Ambulatory Visit: Payer: Self-pay

## 2023-07-08 DIAGNOSIS — Z79899 Other long term (current) drug therapy: Secondary | ICD-10-CM | POA: Diagnosis not present

## 2023-07-08 DIAGNOSIS — F39 Unspecified mood [affective] disorder: Secondary | ICD-10-CM | POA: Insufficient documentation

## 2023-07-08 DIAGNOSIS — F1910 Other psychoactive substance abuse, uncomplicated: Secondary | ICD-10-CM | POA: Diagnosis not present

## 2023-07-08 DIAGNOSIS — Z794 Long term (current) use of insulin: Secondary | ICD-10-CM | POA: Diagnosis not present

## 2023-07-08 DIAGNOSIS — E119 Type 2 diabetes mellitus without complications: Secondary | ICD-10-CM | POA: Diagnosis not present

## 2023-07-08 DIAGNOSIS — F32A Depression, unspecified: Secondary | ICD-10-CM | POA: Diagnosis present

## 2023-07-08 DIAGNOSIS — I10 Essential (primary) hypertension: Secondary | ICD-10-CM | POA: Diagnosis not present

## 2023-07-08 DIAGNOSIS — Z7984 Long term (current) use of oral hypoglycemic drugs: Secondary | ICD-10-CM | POA: Insufficient documentation

## 2023-07-08 LAB — CBG MONITORING, ED: Glucose-Capillary: 143 mg/dL — ABNORMAL HIGH (ref 70–99)

## 2023-07-08 NOTE — ED Provider Notes (Signed)
[place] EMERGENCY DEPARTMENT AT [facility] Provider Note   CSN: [id number] Arrival date & time: 07/08/23  0016     History  Chief Complaint  Patient presents with   Depression    [name] Kloth. is a 62 y.o. male.  The history is provided by the patient.  Depression  [name] Moeller. is a 62 y.o. male who presents to the Emergency Department complaining of depression.  He states that he has been feeling depressed for 1-1/2 months due to problems with drug and alcohol.  He states he uses alcohol regularly and smokes crack cocaine.  Last use yesterday.  He states that things are not going right.  He does not have any intent to harm himself.  He does have passive HI at times but does not currently have a plan or anyone that he intends to harm.  He states that he has not had his meds in a few days but he does have them with him.  Hx/o DM, HTN.      Home Medications Prior to Admission medications   Medication Sig Start Date End Date Taking? Authorizing Provider  amLODipine (NORVASC) 5 MG tablet Take 1 tablet (5 mg total) by mouth daily. Patient not taking: Reported on 05/18/2021 05/05/21   Money, Gerlene Burdock, FNP  amLODipine-benazepril (LOTREL) 5-10 MG capsule Take 1 capsule by mouth daily. 05/17/21   [provider]  benazepril (LOTENSIN) 10 MG tablet Take 1 tablet (10 mg total) by mouth daily. Patient not taking: Reported on 05/18/2021 05/05/21   Money, Gerlene Burdock, FNP  chlordiazePOXIDE (LIBRIUM) 25 MG capsule 50mg  PO TID x 1D, then 25-50mg  PO BID X 1D, then 25-50mg  PO QD X 1D 07/07/23   Rondel Baton, MD  citalopram (CELEXA) 20 MG tablet Take 1 tablet (20 mg total) by mouth daily. 05/04/21   Money, Gerlene Burdock, FNP  fluticasone (FLONASE) 50 MCG/ACT nasal spray Place 1 spray into both nostrils daily as needed for allergies or rhinitis.    [provider]  gabapentin (NEURONTIN) 400 MG capsule TAKE 1 CAPSULE BY MOUTH THREE TIMES A DAY. 09/17/21   Vickki Hearing, MD  glipiZIDE (GLUCOTROL) 5 MG tablet Take 1 tablet (5 mg total) by mouth daily. 05/04/21   Money, Gerlene Burdock, FNP  guaiFENesin (MUCINEX) 600 MG 12 hr tablet Take 600 mg by mouth 2 (two) times daily as needed for cough or to loosen phlegm.    [provider]  insulin glargine (LANTUS) 100 UNIT/ML injection Inject 0.2 mLs (20 Units total) into the skin daily. Patient taking differently: Inject 2-8 Units into the skin daily as needed. Sliding scale 2-8 05/05/21   Money, Gerlene Burdock, FNP  insulin lispro (HUMALOG) 100 UNIT/ML injection Inject 20 Units into the skin daily as needed for high blood sugar.    [provider]  Insulin Pen Needle (NOVOFINE) 30G X 8 MM MISC Inject 10 each into the skin as needed. 07/07/23 08/06/23  Rondel Baton, MD  levocetirizine (XYZAL) 5 MG tablet Take 5 mg by mouth daily. 05/17/21   [provider]  lisinopril (ZESTRIL) 10 MG tablet Take 10 mg by mouth daily.    [provider]  loratadine (CLARITIN) 10 MG tablet Take 1 tablet (10 mg total) by mouth daily. Patient taking differently: Take 10 mg by mouth daily as needed for allergies. 05/05/21   Money, Gerlene Burdock, FNP  metFORMIN (GLUCOPHAGE) 1000 MG tablet Take 1,000 mg by mouth 2 (two) times  daily with a meal.    [provider]  metFORMIN (GLUCOPHAGE) 500 MG tablet Take 2 tablets (1,000 mg total) by mouth 2 (two) times daily with a meal. Patient not taking: Reported on 05/18/2021 05/04/21   Money, Gerlene Burdock, FNP  OLANZapine (ZYPREXA) 20 MG tablet Take 20 mg by mouth in the morning and at bedtime.    [provider]  omeprazole (PRILOSEC) 40 MG capsule Take 40 mg by mouth daily.    [provider]  ondansetron (ZOFRAN-ODT) 4 MG disintegrating tablet Take 4 mg by mouth every 8 (eight) hours as needed for nausea or vomiting.    [provider]  oxyCODONE (OXY IR/ROXICODONE) 5 MG immediate release tablet Take 5 mg by mouth every 6 (six) hours as needed  for severe pain or moderate pain.    [provider]  oxymetazoline (AFRIN) 0.05 % nasal spray Place 1 spray into both nostrils 2 (two) times daily as needed for congestion.    [provider]  pantoprazole (PROTONIX) 40 MG tablet Take 1 tablet (40 mg total) by mouth daily. 05/05/21   Money, Gerlene Burdock, FNP  tamsulosin (FLOMAX) 0.4 MG CAPS capsule Take 1 capsule (0.4 mg total) by mouth daily. 05/04/21   Money, Gerlene Burdock, FNP  traZODone (DESYREL) 100 MG tablet Take 100 mg by mouth at bedtime as needed for sleep.    [provider]  traZODone (DESYREL) 50 MG tablet Take 1 tablet (50 mg total) by mouth at bedtime as needed for sleep. Patient not taking: Reported on 05/18/2021 05/04/21   Money, Gerlene Burdock, FNP  triamcinolone cream (KENALOG) 0.1 % Apply 1 application topically 2 (two) times daily. 07/03/21   Bing Neighbors, NP  VASCEPA 1 g capsule Take 2 capsules (2 g total) by mouth 2 (two) times daily. 05/04/21   Money, Gerlene Burdock, FNP      Allergies    Bee venom and Penicillins    Review of Systems   Review of Systems  Psychiatric/Behavioral:  Positive for depression.   All other systems reviewed and are negative.   Physical Exam Updated Vital Signs BP (!) 128/90   Pulse 68   Temp 98 F (36.7 C) (Oral)   Resp 16   SpO2 96%  Physical Exam Vitals and nursing note reviewed.  Constitutional:      Appearance: He is well-developed.  HENT:     Head: Normocephalic and atraumatic.  Cardiovascular:     Rate and Rhythm: Normal rate and regular rhythm.  Pulmonary:     Effort: Pulmonary effort is normal. No respiratory distress.  Abdominal:     Palpations: Abdomen is soft.  Musculoskeletal:        General: No tenderness.  Skin:    General: Skin is warm and dry.  Neurological:     Mental Status: He is alert and oriented to person, place, and time.  Psychiatric:     Comments: Flat affect, poor eye contact. Does not appear to be responding to internal stimuli     ED  Results / Procedures / Treatments   Labs (all labs ordered are listed, but only abnormal results are displayed) Labs Reviewed  CBG MONITORING, ED - Abnormal; Notable for the following components:      Result Value   Glucose-Capillary 143 (*)    All other components within normal limits    EKG None  Radiology DG Hip Unilat W or Wo Pelvis 2-3 Views Left  Result Date: 07/07/2023 CLINICAL DATA:  Chronic left hip pain. EXAM: DG HIP (WITH OR WITHOUT PELVIS) 2-3V LEFT COMPARISON:  April 09, 2018. FINDINGS: Status post left total hip arthroplasty. No fracture or dislocation is noted. IMPRESSION: No acute abnormality seen. Electronically Signed   By: Lupita Raider M.D.   On: 07/07/2023 14:19    Procedures Procedures    Medications Ordered in ED Medications - No data to display  ED Course/ Medical Decision Making/ A&P                             Medical Decision Making  Pt with hx/o HTN, DM, polysubstance abuse here for depression.  He has flat affect, does not appear to be psychotic.  He has been evaluated by behavioral health today and cleared. He is not clinically intoxicated.  He does not meet IVC criteria at this time. Plan to d/c with outpatient resources.          Final Clinical Impression(s) / ED Diagnoses Final diagnoses:  Mood disorder [facility])  Substance abuse [facility])    Rx / DC Orders ED Discharge Orders     None         Tilden Fossa, MD 07/08/23 [phone]

## 2023-07-08 NOTE — ED Notes (Signed)
Opened chart to help arrange transport to [facility] at pts request.

## 2023-07-08 NOTE — ED Triage Notes (Addendum)
Pt BIB EMS from [facility] for a depressive episode. Pt was d/c from [facility] earlier and they told him to go to [facility]. After [facility] d/c him, he called transportation to bring him to [facility]. Pt stated he has medications but is noncompliant. Pt also stated he has had used cocaine, marijuana, and alcohol today. Pt also stated he just have homicidal thoughts but no plans on acting on it.  114 sbp palpated 96% RA 89 hr 134 cbg

## 2023-07-08 NOTE — ED Notes (Signed)
Pt sleeping in the bed. Pt given a blanket and lights were turned low. Bed at lowest position call light and belongings within reach.

## 2023-07-09 ENCOUNTER — Ambulatory Visit (HOSPITAL_COMMUNITY)
Admission: EM | Admit: 2023-07-09 | Discharge: 2023-07-09 | Disposition: A | Discharge status: Home / Self Care | Payer: 59 | Attending: Family Medicine | Admitting: Family Medicine

## 2023-07-09 ENCOUNTER — Encounter (HOSPITAL_COMMUNITY): Payer: Self-pay | Admitting: Emergency Medicine

## 2023-07-09 ENCOUNTER — Other Ambulatory Visit: Payer: Self-pay

## 2023-07-09 DIAGNOSIS — Z79899 Other long term (current) drug therapy: Secondary | ICD-10-CM | POA: Diagnosis not present

## 2023-07-09 NOTE — ED Provider Notes (Signed)
[facility]    CSN: [id number] Arrival date & time: 07/09/23  1311      History   Chief Complaint Chief Complaint  Patient presents with   Medication Refill    HPI Dewone Sunderman. is a 62 y.o. male.   HPI Patient with a history of polysubstance use and alcohol use disorder, bipolar disorder diabetes, and hypertension presents today requesting refill of all his medications.  Patient divulged to the nurse that he had been drinking alcohol today.  This Clinical research associate asked him if he consumed any alcohol as he appears overly drowsy he tells this Clinical research associate that he had not had any sleep overnight.  Speech is mildly slurred and he does appear to be under the influence or intoxicated. Patient was seen at the ER on yesterday and per ED doc patient had all of his medications with him.  Patient was seen yesterday for depression and reported to ED provider that he had all of his medicines.  Patient asked to be excused from the room and he goes to the lobby and brings to exam room a large bag with multiple different medications.  He reports that he needs insulin as the ED provider only prescribed him insulin needles.  On chart review patient has not had an A1c level drawn since 2022 and at that time it was 7.7.  Apparently he was also prescribed metformin and and glipizide at some point however it is unclear as patient does not have a PCP.  He reports that he gives himself insulin "based on how I'm feeling".  Past Medical History:  Diagnosis Date   Acid reflux    Alcohol abuse    6-8 cans of beer daily; hx of incarceration for DWI   Arthritis    Bipolar disorder (HCC)    Bronchitis    Chronic back pain    Chronic neck pain    Chronic pain    Diabetes mellitus without complication (HCC)    Fracture of lower leg    Gout    Hyperlipidemia    Hypertension    Left arm pain    chronic   Migraine    Pancreatitis    March 2013   Pseudocyst of pancreas 02/28/2012   Substance abuse (HCC)      Patient Active Problem List   Diagnosis Date Noted   Substance induced mood disorder (HCC) 01/19/2021   Alcohol use disorder, severe, dependence (HCC) 01/05/2021   Cocaine use disorder, moderate, dependence (HCC) 01/05/2021   Depression 01/05/2021   Type 2 diabetes mellitus with diabetic neuropathy, without long-term current use of insulin (HCC) 09/21/2020   Septic arthritis (HCC) 08/09/2019   S/P right knee arthroscopy 07/29/2019 08/03/2019   Acute lateral meniscus tear of right knee    Acute medial meniscus tear of right knee    Substance use disorder 03/16/2019   History of diabetes mellitus 10/10/2016   Avascular necrosis of hip, left (HCC) 10/04/2016   Status post left hip replacement 10/04/2016   Rash and nonspecific skin eruption 08/12/2013   Cholelithiases 08/04/2013   Subcutaneous nodule 07/21/2013   Acute alcoholic pancreatitis 04/24/2013   Syncope 04/11/2013   Prolonged Q-T interval on ECG 04/11/2013   Cocaine abuse (HCC) 03/13/2013    Class: Chronic   At risk for adverse drug event 02/14/2013   DDD (degenerative disc disease), lumbar 02/01/2013   Dyspepsia 12/01/2012   BPH (benign prostatic hyperplasia) 10/07/2012   Allergic rhinitis 10/03/2012   Transaminitis 10/02/2012   Chronic  pain syndrome 10/02/2012   Essential hypertension, benign 07/01/2012   Tobacco user 07/01/2012   Hepatic steatosis 03/06/2012   ED (erectile dysfunction) 03/06/2012   Pseudocyst of pancreas 02/28/2012   Alcohol abuse 02/25/2012   GERD (gastroesophageal reflux disease) 02/23/2012   Bipolar 1 disorder (HCC) 02/23/2012   Hypertriglyceridemia 12/05/2011    Past Surgical History:  Procedure Laterality Date   CIRCUMCISION  03/17/2012   Procedure: CIRCUMCISION ADULT;  Surgeon: Ky Barban, MD;  Location: [facility];  Service: Urology;  Laterality: N/A;   COLONOSCOPY WITH PROPOFOL  12/24/2012   ZOX:WRUE lesion as described above status post biopsy; otherwise normal rectum/Sigmoid  diverticulosis/Sigmoid polyps -- resected as described above. anal lesion, benign. colon polyp, hyperplastic   ESOPHAGOGASTRODUODENOSCOPY (EGD) WITH PROPOFOL  12/24/2012   AVW:[id number] erythema and erosions of uncertain significance status post gastric biopsy (reactive gastropathy NO h.pylori)   KNEE ARTHROSCOPY Right 08/09/2019   Procedure: arthroscopic incision and drainage snovectomy;  Surgeon: Durene Romans, MD;  Location: [facility];  Service: Orthopedics;  Laterality: Right;   KNEE ARTHROSCOPY WITH MEDIAL MENISECTOMY Right 07/29/2019   Procedure: RIGHT KNEE ARTHROSCOPY WITH MEDIAL MENISCECTOMY AND LATERAL MENISCECTOMY;  Surgeon: Vickki Hearing, MD;  Location: [facility];  Service: Orthopedics;  Laterality: Right;   NOSE SURGERY     broken nose   TOTAL HIP ARTHROPLASTY Left 10/04/2016   Procedure: LEFT TOTAL HIP ARTHROPLASTY ANTERIOR APPROACH;  Surgeon: Kathryne Hitch, MD;  Location: [facility];  Service: Orthopedics;  Laterality: Left;       Home Medications    Prior to Admission medications   Medication Sig Start Date End Date Taking? Authorizing Provider  amLODipine (NORVASC) 5 MG tablet Take 1 tablet (5 mg total) by mouth daily. Patient not taking: Reported on 05/18/2021 05/05/21   Money, Gerlene Burdock, FNP  amLODipine-benazepril (LOTREL) 5-10 MG capsule Take 1 capsule by mouth daily. 05/17/21   [provider]  benazepril (LOTENSIN) 10 MG tablet Take 1 tablet (10 mg total) by mouth daily. Patient not taking: Reported on 05/18/2021 05/05/21   Money, Gerlene Burdock, FNP  chlordiazePOXIDE (LIBRIUM) 25 MG capsule 50mg  PO TID x 1D, then 25-50mg  PO BID X 1D, then 25-50mg  PO QD X 1D Patient not taking: Reported on 07/09/2023 07/07/23   Rondel Baton, MD  citalopram (CELEXA) 20 MG tablet Take 1 tablet (20 mg total) by mouth daily. Patient not taking: Reported on 07/09/2023 05/04/21   Money, Gerlene Burdock, FNP  fluticasone [facility]) 50 MCG/ACT nasal spray Place 1 spray into both nostrils daily as  needed for allergies or rhinitis.    [provider]  gabapentin (NEURONTIN) 400 MG capsule TAKE 1 CAPSULE BY MOUTH THREE TIMES A DAY. Patient not taking: Reported on 07/09/2023 09/17/21   Vickki Hearing, MD  glipiZIDE (GLUCOTROL) 5 MG tablet Take 1 tablet (5 mg total) by mouth daily. Patient not taking: Reported on 07/09/2023 05/04/21   Money, Gerlene Burdock, FNP  guaiFENesin (MUCINEX) 600 MG 12 hr tablet Take 600 mg by mouth 2 (two) times daily as needed for cough or to loosen phlegm.    [provider]  insulin glargine (LANTUS) 100 UNIT/ML injection Inject 0.2 mLs (20 Units total) into the skin daily. Patient taking differently: Inject 2-8 Units into the skin daily as needed. Sliding scale 2-8 05/05/21   Money, Gerlene Burdock, FNP  insulin lispro (HUMALOG) 100 UNIT/ML injection Inject 20 Units into the skin daily as needed for high blood sugar.    [provider]  Insulin Pen Needle (NOVOFINE) 30G X 8 MM MISC Inject 10 each into the skin as needed. 07/07/23 08/06/23  Rondel Baton, MD  levocetirizine (XYZAL) 5 MG tablet Take 5 mg by mouth daily. 05/17/21   [provider]  lisinopril (ZESTRIL) 10 MG tablet Take 10 mg by mouth daily. Patient not taking: Reported on 07/09/2023    [provider]  loratadine (CLARITIN) 10 MG tablet Take 1 tablet (10 mg total) by mouth daily. Patient taking differently: Take 10 mg by mouth daily as needed for allergies. 05/05/21   Money, Gerlene Burdock, FNP  metFORMIN (GLUCOPHAGE) 1000 MG tablet Take 1,000 mg by mouth 2 (two) times daily with a meal.    [provider]  metFORMIN (GLUCOPHAGE) 500 MG tablet Take 2 tablets (1,000 mg total) by mouth 2 (two) times daily with a meal. Patient not taking: Reported on 05/18/2021 05/04/21   Money, Gerlene Burdock, FNP  OLANZapine (ZYPREXA) 20 MG tablet Take 20 mg by mouth in the morning and at bedtime. Patient not taking: Reported on 07/09/2023    [provider]  omeprazole (PRILOSEC)  40 MG capsule Take 40 mg by mouth daily. Patient not taking: Reported on 07/09/2023    [provider]  ondansetron (ZOFRAN-ODT) 4 MG disintegrating tablet Take 4 mg by mouth every 8 (eight) hours as needed for nausea or vomiting.    [provider]  oxyCODONE (OXY IR/ROXICODONE) 5 MG immediate release tablet Take 5 mg by mouth every 6 (six) hours as needed for severe pain or moderate pain. Patient not taking: Reported on 07/09/2023    [provider]  oxymetazoline (AFRIN) 0.05 % nasal spray Place 1 spray into both nostrils 2 (two) times daily as needed for congestion.    [provider]  pantoprazole (PROTONIX) 40 MG tablet Take 1 tablet (40 mg total) by mouth daily. Patient not taking: Reported on 07/09/2023 05/05/21   Money, Gerlene Burdock, FNP  tamsulosin (FLOMAX) 0.4 MG CAPS capsule Take 1 capsule (0.4 mg total) by mouth daily. 05/04/21   Money, Gerlene Burdock, FNP  traZODone (DESYREL) 100 MG tablet Take 100 mg by mouth at bedtime as needed for sleep. Patient not taking: Reported on 07/09/2023    [provider]  traZODone (DESYREL) 50 MG tablet Take 1 tablet (50 mg total) by mouth at bedtime as needed for sleep. Patient not taking: Reported on 05/18/2021 05/04/21   Money, Gerlene Burdock, FNP  triamcinolone cream (KENALOG) 0.1 % Apply 1 application topically 2 (two) times daily. 07/03/21   Bing Neighbors, NP  VASCEPA 1 g capsule Take 2 capsules (2 g total) by mouth 2 (two) times daily. 05/04/21   Money, Gerlene Burdock, FNP    Family History Family History  Problem Relation Age of Onset   Diabetes Father    Healthy Mother    Anesthesia problems Neg Hx    Hypotension Neg Hx    Malignant hyperthermia Neg Hx    Pseudochol deficiency Neg Hx    Colon cancer Neg Hx    Heart disease Neg Hx    Stroke Neg Hx    Cancer Neg Hx     Social History Social History   Tobacco Use   Smoking status: Every Day    Current packs/day: 0.50    Average packs/day: 0.5 packs/day for  25.0 years (12.5 ttl pk-yrs)    Types: Cigarettes   Smokeless tobacco: Former    Types: Chew    Quit date: 12/23/2014  Vaping  Use   Vaping status: Never Used  Substance Use Topics   Alcohol use: Yes    Comment: heavily   Drug use: Yes    Types: Cocaine, Marijuana     Allergies   Bee venom and Penicillins   Review of Systems Review of Systems Pertinent negatives listed in HPI  Physical Exam Triage Vital Signs ED Triage Vitals  Encounter Vitals Group     BP 07/09/23 1424 107/70     Systolic BP Percentile --      Diastolic BP Percentile --      Pulse Rate 07/09/23 1424 84     Resp 07/09/23 1424 20     Temp 07/09/23 1424 98.1 F (36.7 C)     Temp Source 07/09/23 1424 Oral     SpO2 07/09/23 1424 93 %     Weight --      Height --      Head Circumference --      Peak Flow --      Pain Score 07/09/23 1420 8     Pain Loc --      Pain Education --      Exclude from Growth Chart --    No data found.  Updated Vital Signs BP 107/70 (BP Location: Left Arm)   Pulse 84   Temp 98.1 F (36.7 C) (Oral)   Resp 20   SpO2 93%   Visual Acuity Right Eye Distance:   Left Eye Distance:   Bilateral Distance:    Right Eye Near:   Left Eye Near:    Bilateral Near:     Physical Exam Vitals reviewed.  Constitutional:      Appearance: Normal appearance. He is not ill-appearing.     Comments: Chronically ill appearing   HENT:     Head: Normocephalic and atraumatic.  Cardiovascular:     Rate and Rhythm: Normal rate and regular rhythm.  Pulmonary:     Effort: Pulmonary effort is normal.     Breath sounds: Normal breath sounds.  Musculoskeletal:     Cervical back: Normal range of motion and neck supple.  Neurological:     General: No focal deficit present.  Psychiatric:        Attention and Perception: He is inattentive.        Mood and Affect: Affect is labile and angry.        Behavior: Behavior is agitated. Behavior is cooperative.        Judgment: Judgment is  impulsive.      UC Treatments / Results  Labs (all labs ordered are listed, but only abnormal results are displayed) Labs Reviewed  HEMOGLOBIN A1C    EKG   Radiology No results found.  Procedures Procedures (including critical care time)  Medications Ordered in UC Medications - No data to display  Initial Impression / Assessment and Plan / UC Course  I have reviewed the triage vital signs and the nursing notes.  Pertinent labs & imaging results that were available during my care of the patient were reviewed by me and considered in my medical decision making (see chart for details).    Hemoglobin A1C pending. Deferred refilling insulin until A1c obtained and reviewed.  Prior A1C seen on review of EPIC all were <8%, therefore unclear of the indication patient was ever prescribed insulin.  Patient was in disagreement with this plan, however was cooperative with discharge. Final Clinical Impressions(s) / UC Diagnoses   Final diagnoses:  Medication management  Discharge Instructions      I will review your results of your A1c, if appropriate send insulin to the pharmacy.  You have not had an A1c completed since 2022, at that time your A1c 7.7, which typically doesn't require insulin to manage.     ED Prescriptions   None    PDMP not reviewed this encounter.   Bing Neighbors, NP 07/12/23 [phone]

## 2023-07-09 NOTE — ED Triage Notes (Signed)
Patient is requesting refill of medications.  See notes from 07/08/2023.  Patient reports has been drinking alcohol today.

## 2023-07-09 NOTE — Discharge Instructions (Signed)
I will review your results of your A1c, if appropriate send insulin to the pharmacy.  You have not had an A1c completed since 2022, at that time your A1c 7.7, which typically doesn't require insulin to manage.

## 2023-07-10 LAB — HEMOGLOBIN A1C
Hgb A1c MFr Bld: 5.6 % (ref 4.8–5.6)
Mean Plasma Glucose: 114 mg/dL

## 2023-08-11 ENCOUNTER — Other Ambulatory Visit (INDEPENDENT_AMBULATORY_CARE_PROVIDER_SITE_OTHER)
Admission: EM | Admit: 2023-08-11 | Discharge: 2023-08-18 | Disposition: A | Discharge status: Other Acute Inpatient Hospital | Payer: 59 | Source: Home / Self Care | Admitting: Psychiatry

## 2023-08-11 ENCOUNTER — Encounter (HOSPITAL_COMMUNITY): Payer: Self-pay | Admitting: Registered Nurse

## 2023-08-11 ENCOUNTER — Ambulatory Visit (HOSPITAL_COMMUNITY)
Admission: EM | Admit: 2023-08-11 | Discharge: 2023-08-11 | Disposition: A | Discharge status: Home / Self Care | Payer: 59 | Attending: Registered Nurse | Admitting: Registered Nurse

## 2023-08-11 DIAGNOSIS — I10 Essential (primary) hypertension: Secondary | ICD-10-CM | POA: Insufficient documentation

## 2023-08-11 DIAGNOSIS — Z794 Long term (current) use of insulin: Secondary | ICD-10-CM | POA: Insufficient documentation

## 2023-08-11 DIAGNOSIS — F1410 Cocaine abuse, uncomplicated: Secondary | ICD-10-CM | POA: Insufficient documentation

## 2023-08-11 DIAGNOSIS — F17210 Nicotine dependence, cigarettes, uncomplicated: Secondary | ICD-10-CM | POA: Insufficient documentation

## 2023-08-11 DIAGNOSIS — F1090 Alcohol use, unspecified, uncomplicated: Secondary | ICD-10-CM | POA: Diagnosis not present

## 2023-08-11 DIAGNOSIS — K219 Gastro-esophageal reflux disease without esophagitis: Secondary | ICD-10-CM | POA: Insufficient documentation

## 2023-08-11 DIAGNOSIS — E781 Pure hyperglyceridemia: Secondary | ICD-10-CM | POA: Insufficient documentation

## 2023-08-11 DIAGNOSIS — F1994 Other psychoactive substance use, unspecified with psychoactive substance-induced mood disorder: Secondary | ICD-10-CM | POA: Diagnosis present

## 2023-08-11 DIAGNOSIS — N400 Enlarged prostate without lower urinary tract symptoms: Secondary | ICD-10-CM | POA: Insufficient documentation

## 2023-08-11 DIAGNOSIS — E119 Type 2 diabetes mellitus without complications: Secondary | ICD-10-CM | POA: Diagnosis not present

## 2023-08-11 DIAGNOSIS — M549 Dorsalgia, unspecified: Secondary | ICD-10-CM | POA: Insufficient documentation

## 2023-08-11 DIAGNOSIS — G894 Chronic pain syndrome: Secondary | ICD-10-CM | POA: Insufficient documentation

## 2023-08-11 DIAGNOSIS — Z79899 Other long term (current) drug therapy: Secondary | ICD-10-CM | POA: Insufficient documentation

## 2023-08-11 DIAGNOSIS — E1165 Type 2 diabetes mellitus with hyperglycemia: Secondary | ICD-10-CM | POA: Insufficient documentation

## 2023-08-11 DIAGNOSIS — F10230 Alcohol dependence with withdrawal, uncomplicated: Secondary | ICD-10-CM | POA: Insufficient documentation

## 2023-08-11 DIAGNOSIS — F1020 Alcohol dependence, uncomplicated: Secondary | ICD-10-CM | POA: Diagnosis present

## 2023-08-11 DIAGNOSIS — F1210 Cannabis abuse, uncomplicated: Secondary | ICD-10-CM | POA: Insufficient documentation

## 2023-08-11 DIAGNOSIS — F10930 Alcohol use, unspecified with withdrawal, uncomplicated: Secondary | ICD-10-CM

## 2023-08-11 DIAGNOSIS — F1910 Other psychoactive substance abuse, uncomplicated: Secondary | ICD-10-CM | POA: Diagnosis present

## 2023-08-11 DIAGNOSIS — M25552 Pain in left hip: Secondary | ICD-10-CM | POA: Insufficient documentation

## 2023-08-11 DIAGNOSIS — F1914 Other psychoactive substance abuse with psychoactive substance-induced mood disorder: Secondary | ICD-10-CM | POA: Diagnosis not present

## 2023-08-11 DIAGNOSIS — F10939 Alcohol use, unspecified with withdrawal, unspecified: Secondary | ICD-10-CM | POA: Diagnosis present

## 2023-08-11 DIAGNOSIS — F1110 Opioid abuse, uncomplicated: Secondary | ICD-10-CM | POA: Insufficient documentation

## 2023-08-11 DIAGNOSIS — Z5900 Homelessness unspecified: Secondary | ICD-10-CM | POA: Insufficient documentation

## 2023-08-11 DIAGNOSIS — Z022 Encounter for examination for admission to residential institution: Secondary | ICD-10-CM | POA: Diagnosis not present

## 2023-08-11 DIAGNOSIS — F319 Bipolar disorder, unspecified: Secondary | ICD-10-CM | POA: Diagnosis not present

## 2023-08-11 DIAGNOSIS — F1024 Alcohol dependence with alcohol-induced mood disorder: Secondary | ICD-10-CM | POA: Insufficient documentation

## 2023-08-11 LAB — LIPID PANEL
Cholesterol: 244 mg/dL — ABNORMAL HIGH (ref 0–200)
HDL: 79 mg/dL (ref >40)
LDL Cholesterol: UNDETERMINED mg/dL (ref 0–99)
Total CHOL/HDL Ratio: 3.1 RATIO
Triglycerides: 528 mg/dL — ABNORMAL HIGH (ref <150)
VLDL: UNDETERMINED mg/dL (ref 0–40)

## 2023-08-11 LAB — CBC WITH DIFFERENTIAL/PLATELET
Abs Immature Granulocytes: 0.01 10*3/uL (ref 0.00–0.07)
Basophils Absolute: 0 10*3/uL (ref 0.0–0.1)
Basophils Relative: 1 %
Eosinophils Absolute: 0.1 10*3/uL (ref 0.0–0.5)
Eosinophils Relative: 2 %
HCT: 47 % (ref 39.0–52.0)
Hemoglobin: 15.4 g/dL (ref 13.0–17.0)
Immature Granulocytes: 0 %
Lymphocytes Relative: 31 %
Lymphs Abs: 2 10*3/uL (ref 0.7–4.0)
MCH: 29.4 pg (ref 26.0–34.0)
MCHC: 32.8 g/dL (ref 30.0–36.0)
MCV: 89.9 fL (ref 80.0–100.0)
Monocytes Absolute: 0.7 10*3/uL (ref 0.1–1.0)
Monocytes Relative: 11 %
Neutro Abs: 3.5 10*3/uL (ref 1.7–7.7)
Neutrophils Relative %: 55 %
Platelets: 302 10*3/uL (ref 150–400)
RBC: 5.23 MIL/uL (ref 4.22–5.81)
RDW: 15.8 % — ABNORMAL HIGH (ref 11.5–15.5)
WBC: 6.3 10*3/uL (ref 4.0–10.5)
nRBC: 0 % (ref 0.0–0.2)

## 2023-08-11 LAB — URINALYSIS, ROUTINE W REFLEX MICROSCOPIC
Bilirubin Urine: NEGATIVE
Glucose, UA: NEGATIVE mg/dL
Hgb urine dipstick: NEGATIVE
Ketones, ur: 20 mg/dL — AB
Leukocytes,Ua: NEGATIVE
Nitrite: NEGATIVE
Protein, ur: NEGATIVE mg/dL
Specific Gravity, Urine: 1.025 (ref 1.005–1.030)
pH: 6 (ref 5.0–8.0)

## 2023-08-11 LAB — COMPREHENSIVE METABOLIC PANEL
ALT: 17 U/L (ref 0–44)
AST: 34 U/L (ref 15–41)
Albumin: 4 g/dL (ref 3.5–5.0)
Alkaline Phosphatase: 41 U/L (ref 38–126)
Anion gap: 15 (ref 5–15)
BUN: 20 mg/dL (ref 6–20)
CO2: 27 mmol/L (ref 22–32)
Calcium: 9.7 mg/dL (ref 8.9–10.3)
Chloride: 97 mmol/L — ABNORMAL LOW (ref 98–111)
Creatinine, Ser: 1.13 mg/dL (ref 0.61–1.24)
GFR, Estimated: >60 mL/min (ref >60)
Glucose, Bld: 125 mg/dL — ABNORMAL HIGH (ref 70–99)
Potassium: 4.5 mmol/L (ref 3.5–5.1)
Sodium: 139 mmol/L (ref 135–145)
Total Bilirubin: 0.3 mg/dL (ref 0.3–1.2)
Total Protein: 7.1 g/dL (ref 6.5–8.1)

## 2023-08-11 LAB — POCT URINE DRUG SCREEN - MANUAL ENTRY (I-SCREEN)
POC Amphetamine UR: NOT DETECTED
POC Buprenorphine (BUP): NOT DETECTED
POC Cocaine UR: POSITIVE — AB
POC Marijuana UR: POSITIVE — AB
POC Methadone UR: NOT DETECTED
POC Methamphetamine UR: NOT DETECTED
POC Morphine: NOT DETECTED
POC Oxazepam (BZO): POSITIVE — AB
POC Oxycodone UR: NOT DETECTED
POC Secobarbital (BAR): NOT DETECTED

## 2023-08-11 LAB — MAGNESIUM: Magnesium: 2.2 mg/dL (ref 1.7–2.4)

## 2023-08-11 LAB — TSH: TSH: 0.855 u[IU]/mL (ref 0.350–4.500)

## 2023-08-11 LAB — ETHANOL: Alcohol, Ethyl (B): <10 mg/dL (ref <10)

## 2023-08-11 LAB — HEMOGLOBIN A1C
Hgb A1c MFr Bld: 6.9 % — ABNORMAL HIGH (ref 4.8–5.6)
Mean Plasma Glucose: 151.33 mg/dL

## 2023-08-11 LAB — GLUCOSE, CAPILLARY: Glucose-Capillary: 179 mg/dL — ABNORMAL HIGH (ref 70–99)

## 2023-08-11 LAB — LDL CHOLESTEROL, DIRECT: Direct LDL: 101 mg/dL — ABNORMAL HIGH (ref 0–99)

## 2023-08-11 MED ORDER — GLIPIZIDE 5 MG PO TABS: 5.0000 mg | ORAL_TABLET | Freq: Every day | ORAL | Status: DC

## 2023-08-11 MED ORDER — MIRTAZAPINE 15 MG PO TABS
15.0000 mg | ORAL_TABLET | Freq: Every day | ORAL | Status: DC
  Administered 2023-08-11 – 2023-08-17 (×7): 15 mg via ORAL
  Filled 2023-08-11: qty 1
  Filled 2023-08-11: qty 14
  Filled 2023-08-11 (×6): qty 1

## 2023-08-11 MED ORDER — TAMSULOSIN HCL 0.4 MG PO CAPS
0.4000 mg | ORAL_CAPSULE | Freq: Every day | ORAL | Status: DC
  Administered 2023-08-12 – 2023-08-18 (×7): 0.4 mg via ORAL
  Filled 2023-08-11 (×6): qty 1
  Filled 2023-08-11: qty 14
  Filled 2023-08-11 (×3): qty 1

## 2023-08-11 MED ORDER — DULOXETINE HCL 60 MG PO CPEP: 60.0000 mg | ORAL_CAPSULE | Freq: Every day | ORAL | Status: DC

## 2023-08-11 MED ORDER — GABAPENTIN 300 MG PO CAPS: 600.0000 mg | ORAL_CAPSULE | Freq: Three times a day (TID) | ORAL | Status: DC

## 2023-08-11 MED ORDER — DULOXETINE HCL 60 MG PO CPEP
60.0000 mg | ORAL_CAPSULE | Freq: Every day | ORAL | Status: DC
  Administered 2023-08-11 – 2023-08-18 (×8): 60 mg via ORAL
  Filled 2023-08-11 (×8): qty 1

## 2023-08-11 MED ORDER — FLUTICASONE PROPIONATE 50 MCG/ACT NA SUSP
1.0000 | Freq: Every day | NASAL | Status: DC | PRN
  Administered 2023-08-13: 1 via NASAL
  Filled 2023-08-11: qty 16

## 2023-08-11 MED ORDER — MELOXICAM 7.5 MG PO TABS
15.0000 mg | ORAL_TABLET | Freq: Every day | ORAL | Status: DC
  Administered 2023-08-12 – 2023-08-18 (×7): 15 mg via ORAL
  Filled 2023-08-11 (×4): qty 2
  Filled 2023-08-11: qty 28
  Filled 2023-08-11 (×3): qty 2

## 2023-08-11 MED ORDER — GABAPENTIN 300 MG PO CAPS
600.0000 mg | ORAL_CAPSULE | Freq: Three times a day (TID) | ORAL | Status: DC
  Administered 2023-08-11 – 2023-08-18 (×20): 600 mg via ORAL
  Filled 2023-08-11 (×16): qty 2
  Filled 2023-08-11: qty 84
  Filled 2023-08-11 (×4): qty 2

## 2023-08-11 MED ORDER — MAGNESIUM HYDROXIDE 400 MG/5ML PO SUSP: 30.0000 mL | Freq: Every day | ORAL | Status: DC | PRN

## 2023-08-11 MED ORDER — MELOXICAM 7.5 MG PO TABS: 15.0000 mg | ORAL_TABLET | Freq: Every day | ORAL | Status: DC

## 2023-08-11 MED ORDER — AMLODIPINE BESYLATE 5 MG PO TABS
5.0000 mg | ORAL_TABLET | Freq: Every day | ORAL | Status: DC
  Administered 2023-08-11 – 2023-08-18 (×8): 5 mg via ORAL
  Filled 2023-08-11 (×4): qty 1
  Filled 2023-08-11: qty 14
  Filled 2023-08-11 (×4): qty 1

## 2023-08-11 MED ORDER — LISINOPRIL 10 MG PO TABS: 10.0000 mg | ORAL_TABLET | Freq: Every day | ORAL | Status: DC

## 2023-08-11 MED ORDER — PANTOPRAZOLE SODIUM 40 MG PO TBEC
40.0000 mg | DELAYED_RELEASE_TABLET | Freq: Every day | ORAL | Status: DC
  Administered 2023-08-11 – 2023-08-18 (×8): 40 mg via ORAL
  Filled 2023-08-11 (×4): qty 1
  Filled 2023-08-11: qty 14
  Filled 2023-08-11 (×4): qty 1

## 2023-08-11 MED ORDER — GEMFIBROZIL 600 MG PO TABS: 600.0000 mg | ORAL_TABLET | Freq: Two times a day (BID) | ORAL | Status: DC

## 2023-08-11 MED ORDER — GLIPIZIDE 5 MG PO TABS
5.0000 mg | ORAL_TABLET | Freq: Every day | ORAL | Status: DC
  Administered 2023-08-11 – 2023-08-18 (×8): 5 mg via ORAL
  Filled 2023-08-11 (×3): qty 1
  Filled 2023-08-11: qty 14
  Filled 2023-08-11 (×5): qty 1

## 2023-08-11 MED ORDER — GEMFIBROZIL 600 MG PO TABS
600.0000 mg | ORAL_TABLET | Freq: Two times a day (BID) | ORAL | Status: DC
  Administered 2023-08-12 – 2023-08-18 (×13): 600 mg via ORAL
  Filled 2023-08-11 (×7): qty 1
  Filled 2023-08-11: qty 28
  Filled 2023-08-11 (×6): qty 1

## 2023-08-11 MED ORDER — DOXEPIN HCL 10 MG PO CAPS: 10.0000 mg | ORAL_CAPSULE | Freq: Two times a day (BID) | ORAL | Status: DC

## 2023-08-11 MED ORDER — METFORMIN HCL 500 MG PO TABS
500.0000 mg | ORAL_TABLET | Freq: Two times a day (BID) | ORAL | Status: DC
  Administered 2023-08-12 – 2023-08-18 (×13): 500 mg via ORAL
  Filled 2023-08-11 (×12): qty 1
  Filled 2023-08-11: qty 28
  Filled 2023-08-11: qty 1

## 2023-08-11 MED ORDER — ALUM & MAG HYDROXIDE-SIMETH 200-200-20 MG/5ML PO SUSP: 30.0000 mL | ORAL | Status: DC | PRN

## 2023-08-11 MED ORDER — OLANZAPINE 10 MG PO TABS: 20.0000 mg | ORAL_TABLET | Freq: Every day | ORAL | Status: DC

## 2023-08-11 MED ORDER — LISINOPRIL 10 MG PO TABS
10.0000 mg | ORAL_TABLET | Freq: Every day | ORAL | Status: DC
  Administered 2023-08-11 – 2023-08-18 (×8): 10 mg via ORAL
  Filled 2023-08-11 (×8): qty 1
  Filled 2023-08-11: qty 14

## 2023-08-11 MED ORDER — PANTOPRAZOLE SODIUM 40 MG PO TBEC: 40.0000 mg | DELAYED_RELEASE_TABLET | Freq: Every day | ORAL | Status: DC

## 2023-08-11 MED ORDER — ACETAMINOPHEN 325 MG PO TABS
650.0000 mg | ORAL_TABLET | Freq: Four times a day (QID) | ORAL | Status: DC | PRN
  Administered 2023-08-12 (×2): 650 mg via ORAL
  Filled 2023-08-11 (×2): qty 2

## 2023-08-11 MED ORDER — ACETAMINOPHEN 325 MG PO TABS: 650.0000 mg | ORAL_TABLET | Freq: Four times a day (QID) | ORAL | Status: DC | PRN

## 2023-08-11 MED ORDER — METHOCARBAMOL 750 MG PO TABS
750.0000 mg | ORAL_TABLET | Freq: Three times a day (TID) | ORAL | Status: DC
  Administered 2023-08-11 – 2023-08-18 (×20): 750 mg via ORAL
  Filled 2023-08-11 (×12): qty 1
  Filled 2023-08-11: qty 42
  Filled 2023-08-11 (×8): qty 1

## 2023-08-11 MED ORDER — TAMSULOSIN HCL 0.4 MG PO CAPS: 0.4000 mg | ORAL_CAPSULE | Freq: Every day | ORAL | Status: DC

## 2023-08-11 MED ORDER — MIRTAZAPINE 15 MG PO TABS: 15.0000 mg | ORAL_TABLET | Freq: Every day | ORAL | Status: DC

## 2023-08-11 MED ORDER — METFORMIN HCL 500 MG PO TABS: 500.0000 mg | ORAL_TABLET | Freq: Two times a day (BID) | ORAL | Status: DC

## 2023-08-11 MED ORDER — CITALOPRAM HYDROBROMIDE 20 MG PO TABS
20.0000 mg | ORAL_TABLET | Freq: Every day | ORAL | Status: DC
  Administered 2023-08-11 – 2023-08-16 (×6): 20 mg via ORAL
  Filled 2023-08-11 (×3): qty 1
  Filled 2023-08-11: qty 14
  Filled 2023-08-11 (×3): qty 1

## 2023-08-11 MED ORDER — INSULIN GLARGINE-YFGN 100 UNIT/ML ~~LOC~~ SOLN: 20.0000 [IU] | Freq: Every day | SUBCUTANEOUS | Status: DC

## 2023-08-11 MED ORDER — CITALOPRAM HYDROBROMIDE 20 MG PO TABS: 20.0000 mg | ORAL_TABLET | Freq: Every day | ORAL | Status: DC

## 2023-08-11 MED ORDER — AMLODIPINE BESYLATE 5 MG PO TABS: 5.0000 mg | ORAL_TABLET | Freq: Every day | ORAL | Status: DC

## 2023-08-11 MED ORDER — DOXEPIN HCL 10 MG PO CAPS
10.0000 mg | ORAL_CAPSULE | Freq: Two times a day (BID) | ORAL | Status: DC
  Administered 2023-08-11 – 2023-08-16 (×10): 10 mg via ORAL
  Filled 2023-08-11 (×3): qty 1
  Filled 2023-08-11: qty 28
  Filled 2023-08-11 (×7): qty 1

## 2023-08-11 NOTE — Discharge Instructions (Signed)
Transfer to [facility]

## 2023-08-11 NOTE — Progress Notes (Signed)
   08/11/23 1250  [facility] Triage Screening (Walk-ins at [facility] [address] only)  How Did You Hear About [place]? Self  What Is the Reason for Your Visit/Call Today? Patient is a 62 y.o. male with a hx of Bipolar Disorder and polysubstance abuse who presents requesting detox.  He reports he completed a 21 Day program at [facility] 2 mos ago.  He states he was clean/ sober for a month prior to relapsing last month.  Patient reports he is drinking "a lot" daily, on average 12 pack and a pint.  He uses a joint of THC daily and 1/2 g of cocaine daily for the past month.  Patient is seeking detox treatment, stating he called a Tx center in [place] and was told he could enter their Residential [facility] program once he completes detox.  Patient denies SI, HI and AVH.  How Long Has This Been Causing You Problems? 1 wk - 1 month  Have You Recently Had Any Thoughts About Hurting Yourself? No  Are You Planning to Commit Suicide/Harm Yourself At This time? No  Have you Recently Had Thoughts About Hurting Someone [name]? No  How long ago did you have thoughts of harming others? N/A  Are You Planning To Harm Someone At This Time? No  Are you currently experiencing any auditory, visual or other hallucinations? No  Have You Used Any Alcohol or Drugs in the Past 24 Hours? Yes  How long ago did you use Drugs or Alcohol? Today  What Did You Use and How Much? 1/2 g cocaine today and THC  Do you have any current medical co-morbidities that require immediate attention? No  Clinician description of patient physical appearance/behavior: Patient has several large bags, he appears somewhat disheveled.  He is AAOx4.  What Do You Feel Would Help You the Most Today? Alcohol or Drug Use Treatment  If access to [facility] was not available, would you have sought care in the Emergency Department? No  Determination of Need Urgent (48 hours)  Options For Referral Facility-Based Crisis;[facility]

## 2023-08-11 NOTE — Group Note (Unsigned)
Group Topic: Communication  Group Date: 08/11/2023 Start Time: 2000 End Time: 2030 Facilitators: Rae Lips B  Department: [facility]  Number of Participants: 4  Group Focus: acceptance and activities of daily living skills Treatment Modality:  Leisure Development Interventions utilized were clarification Purpose: express feelings  Name: [name] [name]. Date of Birth: Feb 17, 1961  MR: [id number]    Level of Participation: active Quality of Participation: attentive Interactions with others: gave feedback Mood/Affect: appropriate Triggers (if applicable): NA Cognition: coherent/clear Progress: Gaining insight Response: NA Plan: patient will be encouraged to go to groups.  Patients Problems:  Patient Active Problem List   Diagnosis Date Noted   Polysubstance abuse (HCC) 08/11/2023   Substance induced mood disorder (HCC) 01/19/2021   Alcohol use disorder, severe, dependence (HCC) 01/05/2021   Cocaine use disorder, moderate, dependence (HCC) 01/05/2021   Depression 01/05/2021   Type 2 diabetes mellitus with diabetic neuropathy, without long-term current use of insulin (HCC) 09/21/2020   Septic arthritis (HCC) 08/09/2019   S/P right knee arthroscopy 07/29/2019 08/03/2019   Acute lateral meniscus tear of right knee    Acute medial meniscus tear of right knee    Substance use disorder 03/16/2019   History of diabetes mellitus 10/10/2016   Avascular necrosis of hip, left (HCC) 10/04/2016   Status post left hip replacement 10/04/2016   Rash and nonspecific skin eruption 08/12/2013   Cholelithiases 08/04/2013   Subcutaneous nodule 07/21/2013   Acute alcoholic pancreatitis 04/24/2013   Syncope 04/11/2013   Prolonged Q-T interval on ECG 04/11/2013   Cocaine abuse (HCC) 03/13/2013    Class: Chronic   At risk for adverse drug event 02/14/2013   DDD (degenerative disc disease), lumbar 02/01/2013   Dyspepsia 12/01/2012   BPH (benign prostatic  hyperplasia) 10/07/2012   Allergic rhinitis 10/03/2012   Transaminitis 10/02/2012   Chronic pain syndrome 10/02/2012   Essential hypertension, benign 07/01/2012   Tobacco user 07/01/2012   Hepatic steatosis 03/06/2012   ED (erectile dysfunction) 03/06/2012   Pseudocyst of pancreas 02/28/2012   Alcohol abuse 02/25/2012   GERD (gastroesophageal reflux disease) 02/23/2012   Bipolar 1 disorder (HCC) 02/23/2012   Hypertriglyceridemia 12/05/2011

## 2023-08-11 NOTE — ED Notes (Signed)
Pt keeps shutting his door every round.

## 2023-08-11 NOTE — ED Notes (Signed)
Pt is calm and cooperative during [facility] admission.  Pt is alert and oriented, denies SI/HI, AVH, and any pain. Admission/orientation packet given and pt was oriented to the unit. Education, support, reassurance, and encouragement provided, q15 minute safety checks initiated. Pt's belongings in locker #-----.    R: Pt denies any concerns at this time, and verbally contracts for safety. Pt ambulating on the unit with no issues. Pt remains safe on and off the unit.

## 2023-08-11 NOTE — ED Notes (Signed)
Patient was discharged to [facility] by provider . Patient's labs were completed,EKG, skin check and locker # 9 assigned.

## 2023-08-11 NOTE — ED Provider Notes (Signed)
Behavioral Health Urgent Care Medical Screening Exam  Patient Name: [name] [name]. MRN: [id number] Date of Evaluation: 08/11/23 Chief Complaint:   Diagnosis:  Final diagnoses:  Substance induced mood disorder (HCC)  Polysubstance abuse (HCC)    History of Present illness: [name] [name]. is a 62 y.o. male patient presented to [facility] as a walk in voluntarily requesting detox and assistance with rehab services  [name] Mina., 62 y.o., male patient seen face to face by this provider, chart reviewed, and consulted with Dr. Nelly Rout on 08/11/23.  On evaluation [name] Vallo. reports a history of bipolar disorder and polysubstance abuse (cocaine, alcohol, marijuana, and heroin).  States he came to [facility] today for detox and to get assistance in getting into a rehab facility afterwards.  Patient states he completed 21-day detox program at [facility] 2 months ago and remained clean and sober for one month before he relapsed last month.  He reports he is drinking a 12-pack beer and paint liquor daily,  gram cocaine, and marijuana daily.  Last use of each was today.  He states he has also done heroin twice since relapsed.  He reports he talked to someone at a treatment facility in [facility] and was told had bed available, but he would need to detox first.  Patient denies suicidal/self-harm/homicidal ideation, psychosis, and paranoia.  He states he is homeless.   During evaluation [name] Mina. is seated in exam room no noted distress.  He is alert/oriented x 4, calm, cooperative, attentive, and responses were relevant and appropriate to assessment questions.  He spoke in a clear tone at moderate volume, and normal pace, with good eye contact.   He denies suicidal/self-harm/homicidal ideation, psychosis, and paranoia.  Objectively there is no evidence of psychosis/mania or delusional thinking.  He conversed coherently, with goal directed thoughts, no distractibility, or  pre-occupation.   Flowsheet Row ED from 08/11/2023 in [facility] ED from 07/09/2023 in [facility] at [facility] ED from 07/08/2023 in [facility] Emergency Department at [facility]  C-SSRS RISK CATEGORY No Risk No Risk No Risk       Psychiatric Specialty Exam  Presentation  General Appearance:Appropriate for Environment  Eye Contact:Good  Speech:Clear and Coherent; Normal Rate  Speech Volume:Normal  Handedness:Right   Mood and Affect  Mood: Dysphoric  Affect: Congruent   Thought Process  Thought Processes: Coherent; Goal Directed  Descriptions of Associations:Intact  Orientation:Full (Time, Place and Person)  Thought Content:Logical  Diagnosis of Schizophrenia or Schizoaffective disorder in past: No data recorded  Hallucinations:None  Ideas of Reference:None  Suicidal Thoughts:No  Homicidal Thoughts:No   Sensorium  Memory: Immediate Good; Recent Good  Judgment: Intact  Insight: Lacking   Executive Functions  Concentration: Good  Attention Span: Good  Recall: Good  Fund of Knowledge: Good  Language: Good   Psychomotor Activity  Psychomotor Activity: Normal   Assets  Assets: Communication Skills; Desire for Improvement; Physical Health   Sleep  Sleep: Good  Number of hours: No data recorded  Physical Exam: Physical Exam Vitals and nursing note reviewed. Exam conducted with a chaperone present.  Constitutional:      General: He is not in acute distress.    Appearance: Normal appearance. He is not ill-appearing.  HENT:     Head: Normocephalic.  Eyes:     Conjunctiva/sclera: Conjunctivae normal.  Cardiovascular:     Rate and Rhythm: Normal rate.  Pulmonary:  Effort: Pulmonary effort is normal. No respiratory distress.  Musculoskeletal:        General: Normal range of motion.     Cervical back: Normal range of motion.  Skin:    General: Skin is warm and dry.   Neurological:     Mental Status: He is alert and oriented to person, place, and time.  Psychiatric:        Attention and Perception: Attention and perception normal. He does not perceive auditory or visual hallucinations.        Mood and Affect: Affect normal. Mood is anxious. Depressed: dysphoric.       Speech: Speech normal.        Behavior: Behavior normal. Behavior is cooperative.        Thought Content: Thought content normal. Thought content is not paranoid or delusional. Thought content does not include homicidal or suicidal ideation.        Cognition and Memory: Cognition normal.        Judgment: Judgment normal.    Review of Systems  Constitutional:        No other complaints voiced  Psychiatric/Behavioral:  Positive for substance abuse (Polysubstance). Negative for memory loss. Depression: Stable. Hallucinations: Denies. Suicidal ideas: Denies.Nervous/anxious: Stable.   All other systems reviewed and are negative.  There were no vitals taken for this visit. There is no height or weight on file to calculate BMI.  Musculoskeletal: Strength & Muscle Tone: within normal limits Gait & Station: normal Patient leans: N/A   [facility] MSE Discharge Disposition for Follow up and Recommendations: Based on my evaluation the patient does not appear to have an emergency medical condition and can be discharged with resources and follow up care in outpatient services for Medication Management and [facility] for detox   Lab Orders         CBC with Differential/Platelet         Comprehensive metabolic panel         Hemoglobin A1c         Magnesium         Ethanol         Lipid panel         TSH         Prolactin         Urinalysis, Routine w reflex microscopic -Urine, Clean Catch         POCT Urine Drug Screen - (I-Screen)      Meds ordered this encounter  Medications   acetaminophen (TYLENOL) tablet 650 mg   alum & mag hydroxide-simeth (MAALOX/MYLANTA) 200-200-20  MG/5ML suspension 30 mL   magnesium hydroxide (MILK OF MAGNESIA) suspension 30 mL   meloxicam (MOBIC) tablet 15 mg   gabapentin (NEURONTIN) capsule 600 mg   citalopram (CELEXA) tablet 20 mg   doxepin (SINEQUAN) capsule 10 mg   DULoxetine (CYMBALTA) DR capsule 60 mg   mirtazapine (REMERON) tablet 15 mg   glipiZIDE (GLUCOTROL) tablet 5 mg   metFORMIN (GLUCOPHAGE) tablet 500 mg   lisinopril (ZESTRIL) tablet 10 mg   gemfibrozil (LOPID) tablet 600 mg   amLODipine (NORVASC) tablet 5 mg   OLANZapine (ZYPREXA) tablet 20 mg   pantoprazole (PROTONIX) EC tablet 40 mg   tamsulosin (FLOMAX) capsule 0.4 mg     Calix Heinbaugh, NP 08/11/2023, 3:44 PM
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
## 2023-08-12 DIAGNOSIS — E119 Type 2 diabetes mellitus without complications: Secondary | ICD-10-CM

## 2023-08-12 DIAGNOSIS — F10939 Alcohol use, unspecified with withdrawal, unspecified: Secondary | ICD-10-CM | POA: Diagnosis present

## 2023-08-12 DIAGNOSIS — F1994 Other psychoactive substance use, unspecified with psychoactive substance-induced mood disorder: Secondary | ICD-10-CM | POA: Diagnosis not present

## 2023-08-12 DIAGNOSIS — F1090 Alcohol use, unspecified, uncomplicated: Secondary | ICD-10-CM | POA: Diagnosis not present

## 2023-08-12 MED ORDER — ADULT MULTIVITAMIN W/MINERALS CH
1.0000 | ORAL_TABLET | Freq: Every day | ORAL | Status: DC
  Administered 2023-08-12 – 2023-08-18 (×7): 1 via ORAL
  Filled 2023-08-12 (×7): qty 1

## 2023-08-12 MED ORDER — LIDOCAINE 5 % EX PTCH
1.0000 | MEDICATED_PATCH | CUTANEOUS | Status: DC
  Administered 2023-08-12 – 2023-08-17 (×6): 1 via TRANSDERMAL
  Filled 2023-08-12 (×6): qty 1
  Filled 2023-08-12: qty 14

## 2023-08-12 MED ORDER — BISMUTH SUBSALICYLATE 262 MG PO CHEW
524.0000 mg | CHEWABLE_TABLET | ORAL | Status: DC | PRN
  Administered 2023-08-12: 524 mg via ORAL
  Filled 2023-08-12: qty 2

## 2023-08-12 MED ORDER — HYDROXYZINE HCL 25 MG PO TABS
25.0000 mg | ORAL_TABLET | Freq: Three times a day (TID) | ORAL | Status: DC | PRN
  Administered 2023-08-17 – 2023-08-18 (×2): 25 mg via ORAL
  Filled 2023-08-12 (×2): qty 1
  Filled 2023-08-12: qty 10
  Filled 2023-08-12: qty 1

## 2023-08-12 MED ORDER — LORAZEPAM 2 MG/ML IJ SOLN
1.0000 mg | INTRAMUSCULAR | Status: DC | PRN
Start: 2023-08-12 — End: 2023-08-12

## 2023-08-12 MED ORDER — LOPERAMIDE HCL 2 MG PO CAPS
4.0000 mg | ORAL_CAPSULE | Freq: Once | ORAL | Status: AC
  Administered 2023-08-12: 4 mg via ORAL
  Filled 2023-08-12: qty 2

## 2023-08-12 MED ORDER — ALUM & MAG HYDROXIDE-SIMETH 200-200-20 MG/5ML PO SUSP: 30.0000 mL | ORAL | Status: DC | PRN

## 2023-08-12 MED ORDER — NAPROXEN 500 MG PO TABS
500.0000 mg | ORAL_TABLET | Freq: Two times a day (BID) | ORAL | Status: DC | PRN
  Administered 2023-08-12 – 2023-08-18 (×7): 500 mg via ORAL
  Filled 2023-08-12 (×7): qty 1

## 2023-08-12 MED ORDER — THIAMINE MONONITRATE 100 MG PO TABS
100.0000 mg | ORAL_TABLET | Freq: Every day | ORAL | Status: DC
  Administered 2023-08-12 – 2023-08-18 (×7): 100 mg via ORAL
  Filled 2023-08-12 (×7): qty 1

## 2023-08-12 MED ORDER — CHLORDIAZEPOXIDE HCL 25 MG PO CAPS: 25.0000 mg | ORAL_CAPSULE | ORAL | Status: DC | PRN

## 2023-08-12 MED ORDER — POLYETHYLENE GLYCOL 3350 17 G PO PACK: 17.0000 g | PACK | Freq: Every day | ORAL | Status: DC | PRN

## 2023-08-12 MED ORDER — ACETAMINOPHEN 325 MG PO TABS
650.0000 mg | ORAL_TABLET | Freq: Four times a day (QID) | ORAL | Status: DC | PRN
  Administered 2023-08-13 – 2023-08-15 (×3): 650 mg via ORAL
  Filled 2023-08-12 (×3): qty 2

## 2023-08-12 MED ORDER — SENNA 8.6 MG PO TABS: 1.0000 | ORAL_TABLET | Freq: Every evening | ORAL | Status: DC | PRN

## 2023-08-12 MED ORDER — LORAZEPAM 1 MG PO TABS
1.0000 mg | ORAL_TABLET | ORAL | Status: DC | PRN
Start: 2023-08-12 — End: 2023-08-12

## 2023-08-12 NOTE — Care Management (Signed)
[facility] Care Management   Writer referred patient to [facility], [facility], [facility] and  [facility] [place]

## 2023-08-12 NOTE — Group Note (Signed)
Group Topic: Communication  Group Date: 08/12/2023 Start Time: 0900 End Time: 0945 Facilitators: Mayer Camel L, RN  Department: [facility]  Number of Participants: 5  Group Focus: communication Treatment Modality:  Individual Therapy Interventions utilized were patient education Purpose: medication education  Name: [name] [name]. Date of Birth: 1961/08/31  MR: [id number]    Level of Participation: when cued Quality of Participation: passive Interactions with others: gave feedback Mood/Affect: flat Triggers (if applicable): n/a Cognition: coherent/clear Progress: moderate Response: n/a Plan: patient will be encouraged to continue taking medications a prescribed.  Patients Problems:  Patient Active Problem List   Diagnosis Date Noted   Alcohol withdrawal (HCC) 08/12/2023   Polysubstance abuse (HCC) 08/11/2023   Substance induced mood disorder (HCC) 01/19/2021   Alcohol use disorder, severe, dependence (HCC) 01/05/2021   Cocaine use disorder, moderate, dependence (HCC) 01/05/2021   Depression 01/05/2021   Type 2 diabetes mellitus with diabetic neuropathy, without long-term current use of insulin (HCC) 09/21/2020   Septic arthritis (HCC) 08/09/2019   S/P right knee arthroscopy 07/29/2019 08/03/2019   Acute lateral meniscus tear of right knee    Acute medial meniscus tear of right knee    Substance use disorder 03/16/2019   History of diabetes mellitus 10/10/2016   Avascular necrosis of hip, left (HCC) 10/04/2016   Status post left hip replacement 10/04/2016   Rash and nonspecific skin eruption 08/12/2013   Cholelithiases 08/04/2013   Subcutaneous nodule 07/21/2013   Acute alcoholic pancreatitis 04/24/2013   Syncope 04/11/2013   Prolonged Q-T interval on ECG 04/11/2013   Cocaine abuse (HCC) 03/13/2013    Class: Chronic   At risk for adverse drug event 02/14/2013   DDD (degenerative disc disease), lumbar 02/01/2013   Dyspepsia 12/01/2012    BPH (benign prostatic hyperplasia) 10/07/2012   Allergic rhinitis 10/03/2012   Transaminitis 10/02/2012   Chronic pain syndrome 10/02/2012   Essential hypertension, benign 07/01/2012   Tobacco user 07/01/2012   Hepatic steatosis 03/06/2012   ED (erectile dysfunction) 03/06/2012   Pseudocyst of pancreas 02/28/2012   Alcohol abuse 02/25/2012   GERD (gastroesophageal reflux disease) 02/23/2012   Bipolar 1 disorder (HCC) 02/23/2012   Hypertriglyceridemia 12/05/2011

## 2023-08-12 NOTE — ED Notes (Signed)
Patient A&Ox4. Patient denies SI/Hi and AVH. Patient denies any physical complaints when asked. No acute alcohol withdrawal noted. Support and encouragement provided. Routine safety checks conducted according to facility protocol. Encouraged patient to notify staff if thoughts of harm toward self or others arise. Patient verbalize understanding and agreement. Will continue to monitor for safety.

## 2023-08-12 NOTE — Group Note (Signed)
Group Topic: Relaxation  Group Date: 08/12/2023 Start Time: 1100 End Time: 1130 Facilitators: Londell Moh, NT  Department: [facility]  Number of Participants: 7  Group Focus: leisure skills and relaxation Treatment Modality:  Psychoeducation Interventions utilized were leisure development and patient education Purpose: increase insight   Name: [name] [name]. Date of Birth: 1961/04/17  MR: [id number]    Level of Participation: Pt did not attend group, stated that he did not want to. Patients Problems:  Patient Active Problem List   Diagnosis Date Noted   Polysubstance abuse (HCC) 08/11/2023   Substance induced mood disorder (HCC) 01/19/2021   Alcohol use disorder, severe, dependence (HCC) 01/05/2021   Cocaine use disorder, moderate, dependence (HCC) 01/05/2021   Depression 01/05/2021   Type 2 diabetes mellitus with diabetic neuropathy, without long-term current use of insulin (HCC) 09/21/2020   Septic arthritis (HCC) 08/09/2019   S/P right knee arthroscopy 07/29/2019 08/03/2019   Acute lateral meniscus tear of right knee    Acute medial meniscus tear of right knee    Substance use disorder 03/16/2019   History of diabetes mellitus 10/10/2016   Avascular necrosis of hip, left (HCC) 10/04/2016   Status post left hip replacement 10/04/2016   Rash and nonspecific skin eruption 08/12/2013   Cholelithiases 08/04/2013   Subcutaneous nodule 07/21/2013   Acute alcoholic pancreatitis 04/24/2013   Syncope 04/11/2013   Prolonged Q-T interval on ECG 04/11/2013   Cocaine abuse (HCC) 03/13/2013    Class: Chronic   At risk for adverse drug event 02/14/2013   DDD (degenerative disc disease), lumbar 02/01/2013   Dyspepsia 12/01/2012   BPH (benign prostatic hyperplasia) 10/07/2012   Allergic rhinitis 10/03/2012   Transaminitis 10/02/2012   Chronic pain syndrome 10/02/2012   Essential hypertension, benign 07/01/2012   Tobacco user 07/01/2012   Hepatic  steatosis 03/06/2012   ED (erectile dysfunction) 03/06/2012   Pseudocyst of pancreas 02/28/2012   Alcohol abuse 02/25/2012   GERD (gastroesophageal reflux disease) 02/23/2012   Bipolar 1 disorder (HCC) 02/23/2012   Hypertriglyceridemia 12/05/2011

## 2023-08-12 NOTE — ED Notes (Signed)
Pt is currently sleeping, no distress noted, environmental check complete, will continue to monitor patient for safety.  

## 2023-08-12 NOTE — ED Notes (Signed)
 Pt was provided lunch

## 2023-08-12 NOTE — ED Notes (Signed)
Patient has requested resources for other detox facilities. Provider messaged.

## 2023-08-12 NOTE — ED Notes (Signed)
Pt was provided dinner.

## 2023-08-12 NOTE — Group Note (Signed)
Group Topic: Communication  Group Date: 08/12/2023 Start Time: 2000 End Time: 2030 Facilitators: Rae Lips B  Department: [facility]  Number of Participants: 2  Group Focus: activities of daily living skills, anxiety, check in, clarity of thought, communication, coping skills, daily focus, depression, and family Treatment Modality:  Leisure Development Interventions utilized were leisure development Purpose: enhance coping skills, express feelings, increase insight, and regain self-worth  Name: Leon Larue. Date of Birth: December 18, 1961  MR: [id number]    Level of Participation: Did not attend groups.  Quality of Participation: NA Interactions with others: NA Mood/Affect: NA Triggers (if applicable): NA Cognition: NA Progress: Other Response: NA Plan: patient will be encouraged to go to groups.   Patients Problems:  Patient Active Problem List   Diagnosis Date Noted   Alcohol withdrawal (HCC) 08/12/2023   Polysubstance abuse (HCC) 08/11/2023   Substance induced mood disorder (HCC) 01/19/2021   Alcohol use disorder, severe, dependence (HCC) 01/05/2021   Cocaine use disorder, moderate, dependence (HCC) 01/05/2021   Depression 01/05/2021   Type 2 diabetes mellitus with diabetic neuropathy, without long-term current use of insulin (HCC) 09/21/2020   Septic arthritis (HCC) 08/09/2019   S/P right knee arthroscopy 07/29/2019 08/03/2019   Acute lateral meniscus tear of right knee    Acute medial meniscus tear of right knee    Substance use disorder 03/16/2019   History of diabetes mellitus 10/10/2016   Avascular necrosis of hip, left (HCC) 10/04/2016   Status post left hip replacement 10/04/2016   Rash and nonspecific skin eruption 08/12/2013   Cholelithiases 08/04/2013   Subcutaneous nodule 07/21/2013   Acute alcoholic pancreatitis 04/24/2013   Syncope 04/11/2013   Prolonged Q-T interval on ECG 04/11/2013   Cocaine abuse (HCC) 03/13/2013     Class: Chronic   At risk for adverse drug event 02/14/2013   DDD (degenerative disc disease), lumbar 02/01/2013   Dyspepsia 12/01/2012   BPH (benign prostatic hyperplasia) 10/07/2012   Allergic rhinitis 10/03/2012   Transaminitis 10/02/2012   Chronic pain syndrome 10/02/2012   Essential hypertension, benign 07/01/2012   Tobacco user 07/01/2012   Hepatic steatosis 03/06/2012   ED (erectile dysfunction) 03/06/2012   Pseudocyst of pancreas 02/28/2012   Alcohol abuse 02/25/2012   GERD (gastroesophageal reflux disease) 02/23/2012   Bipolar 1 disorder (HCC) 02/23/2012   Hypertriglyceridemia 12/05/2011

## 2023-08-12 NOTE — ED Notes (Signed)
Patient currently sitting in the dayroom watching television, no distress noted, will continue to monitor patient for safety.

## 2023-08-12 NOTE — ED Notes (Signed)
Patient is A&O x 4. Consumed breakfast. Denies S/I, H/I, AVH. Pleasant interaction. Reports experiencing a little anxiety this am but did not elaborate. No s/s of ETOH w/d observed. Safety check continue.

## 2023-08-12 NOTE — ED Notes (Addendum)
Patient c/o ongoing headache which he believes is r/t is DM. BGM check with results of 203. Message to provider.

## 2023-08-12 NOTE — ED Notes (Signed)
Pt was provided breakfast.

## 2023-08-12 NOTE — ED Notes (Signed)
cocaine, alcohol, marijuana, and heroin - new

## 2023-08-12 NOTE — ED Provider Notes (Addendum)
Facility Based Crisis Admission H&P  Date: 08/12/23 Patient Name: [name] [name]. MRN: [id number] Chief Complaint: alcohol detox   Diagnoses:  Final diagnoses:  Alcohol withdrawal syndrome without complication (HCC)   HPI:  [name] Bandstra. is a 62 y.o., male with a past psychiatric history of bipolar disorder, polysubstance abuse, and depression  who presents to the Facility Based Crisis center from behavioral health urgent care [facility]) for evaluation and management of polysubstance abuse with primary concern for alcohol withdrawal.   Patient reportedly attended a 21 day detoxification program at [facility] treatment center 2 months ago, followed by 1 month of sobriety after which he subsequently relapsed. Patient discusses use of alcohol, cocaine, marijuana, and heroin throughout the past month. Patient states that he generally drinks 1 pint of liquor and a 12 back of beer daily, noting most recent drink on the morning of 8/19. He reports smoking/snorting about 0.5 g of cocaine daily and spends hundreds of dollars on cocaine each month. Patient also states that he smokes marijuana daily, spending about $30-40 per month. He notes occasional heroin use, reporting 2 uses since most recent relapse. Patient denies any history of IV drug use. Patient states that his longest period of sobriety lasted 34 months. He denies any history of alcohol withdrawal that resulted in seizures or required inpatient admission. Patient denies current/previous AVH. Patient has previously did rehabilitation at [facility] and [facility]. He thought that [facility] was ineffective for him due to the facility having "too many people," though he found [facility] to be somewhat effective.   At today's encounter, patient denies current cravings for any substances and further denies any current withdrawal symptoms, though he notes history of tremors. Patient denies SI/HI/AVH.   All questions were answered at the time of the  interview. Patient denied any additional concerns or complaints at today's encounter.  Review of Systems  Constitutional:  Negative for chills, diaphoresis, fever and malaise/fatigue.  Respiratory:  Negative for shortness of breath.   Cardiovascular:  Negative for chest pain and palpitations.  Gastrointestinal:  Negative for abdominal pain, constipation, diarrhea, nausea and vomiting.  Musculoskeletal:        Left hip pain (history of total hip arthroplasty)  Neurological:  Negative for seizures and headaches.  Psychiatric/Behavioral:  Positive for substance abuse. Negative for hallucinations and suicidal ideas.    Previous psychiatric diagnoses: Bipolar disorder, depression, polysubstance abuse   Psychotropic medication trials: Celexa 20 mg, Zyprexa 20 mg, Remeron 15 mg  Current psychiatric medications: Patient reports that he stopped medications about 1 week prior to admission   Past Medical History: Hypertriglyceridemia, GERD, HTN, chronic pain syndrome, BPH, degenerative disc disease, type 2 diabetes mellitus Allergies: Bee venom and Penicillins Family History: not discussed at this time  Social History: Homeless and unemployed (disability since 2013 for back pain). Single with 1 child. Previously lived in [place], [place]. Family in [place].  Substance Use History:  Tobacco: uses 1 pack of cigarettes over a 3 day period  Alcohol: daily (1 pint of liquor and 12 pack of beer) Marijuana: daily  Cocaine: 0.5 g daily  Opioids: Heroin (occasional, 2 times in the past month) Rehab: [facility], [facility]   PHQ 2-9:  Flowsheet Row ED from 12/31/2020 in [facility]  Thoughts that you would be better off dead, or of hurting yourself in some way Several days  [Phreesia 12/31/2020]  PHQ-9 Total Score 16       Flowsheet Row ED from 08/11/2023 in  [facility] ED from 07/09/2023 in [facility] ED from  07/08/2023 in [facility] Emergency Department at [facility]  C-SSRS RISK CATEGORY No Risk No Risk No Risk        Total Time spent with patient: 45 minutes  OBJECTIVE:   Blood pressure (!) 137/98, pulse 65, temperature 97.9 F (36.6 C), temperature source Tympanic, resp. rate 20, SpO2 100%. There is no height or weight on file to calculate BMI. Physical Exam Constitutional:      General: He is not in acute distress.    Appearance: Normal appearance. He is not ill-appearing or toxic-appearing.  HENT:     Head: Normocephalic and atraumatic.  Pulmonary:     Effort: Pulmonary effort is normal.  Musculoskeletal:     Cervical back: Normal range of motion.  Neurological:     General: No focal deficit present.     Mental Status: He is alert.  Psychiatric:        Attention and Perception: Attention normal.        Mood and Affect: Mood is depressed.        Behavior: Behavior normal. Behavior is cooperative.        Cognition and Memory: Cognition normal.     Comments: Speech: mumbles     Musculoskeletal  Strength & Muscle Tone: within normal limits Gait & Station: normal Patient leans: N/A  Psychiatric Specialty Exam  Presentation General Appearance: Appropriate for Environment   Eye Contact:Good   Speech:Clear and Coherent; Normal Rate   Speech Volume:Normal   Handedness:Right   Mood and Affect  Mood:Dysphoric   Affect:Congruent   Thought Process  Thought Processes:Coherent; Goal Directed   Descriptions of Associations:Intact   Orientation:Full (Time, Place and Person)   Thought Content:Logical  Diagnosis of Schizophrenia or Schizoaffective disorder in past: No    Hallucinations:Hallucinations: None   Ideas of Reference:None   Suicidal Thoughts:Suicidal Thoughts: No   Homicidal Thoughts:Homicidal Thoughts: No   Sensorium  Memory:Immediate Good; Recent Good   Judgment:Intact   Insight:Lacking   Executive Functions   Concentration:Good   Attention Span:Good   Recall:Good   Fund of Knowledge:Good   Language:Good   Psychomotor Activity  Psychomotor Activity:Psychomotor Activity: Normal   Assets  Assets:Communication Skills; Desire for Improvement; Physical Health   Sleep  Sleep:Sleep: Good   Nutritional Assessment (For OBS and FBC admissions only) Has the patient had a weight loss or gain of 10 pounds or more in the last 3 months?: No Has the patient had a decrease in food intake/or appetite?: No Does the patient have dental problems?: No Does the patient have eating habits or behaviors that may be indicators of an eating disorder including binging or inducing vomiting?: No Has the patient recently lost weight without trying?: 0 Has the patient been eating poorly because of a decreased appetite?: 0 Malnutrition Screening Tool Score: 0     Last Labs:     Latest Ref Rng & Units 08/11/2023    4:04 PM 07/07/2023   12:50 PM 05/18/2021   12:08 AM  CMP  Glucose 70 - 99 mg/dL 161  85  096   BUN 6 - 20 mg/dL 20  12  20    Creatinine 0.61 - 1.24 mg/dL 0.45  4.09  8.11   Sodium 135 - 145 mmol/L 139  140  140   Potassium 3.5 - 5.1 mmol/L 4.5  3.7  3.7   Chloride 98 - 111 mmol/L 97  105  107   CO2 22 - 32 mmol/L 27  26  24    Calcium 8.9 - 10.3 mg/dL 9.7  9.1  8.7   Total Protein 6.5 - 8.1 g/dL 7.1  7.2  6.0   Total Bilirubin 0.3 - 1.2 mg/dL 0.3  0.7  0.5   Alkaline Phos 38 - 126 U/L 41  30  29   AST 15 - 41 U/L 34  26  21   ALT 0 - 44 U/L 17  20  22    CBC    Component Value Date/Time   WBC 6.3 08/11/2023 1604   RBC 5.23 08/11/2023 1604   HGB 15.4 08/11/2023 1604   HCT 47.0 08/11/2023 1604   PLT 302 08/11/2023 1604   MCV 89.9 08/11/2023 1604   MCH 29.4 08/11/2023 1604   MCHC 32.8 08/11/2023 1604   RDW 15.8 (H) 08/11/2023 1604   LYMPHSABS 2.0 08/11/2023 1604   MONOABS 0.7 08/11/2023 1604   EOSABS 0.1 08/11/2023 1604   BASOSABS 0.0 08/11/2023 1604    Is the patient at risk  to self? No Has the patient been a risk to self in the past 6 months? No Has the patient been a risk to self within the distant past? No Is the patient a risk to others? No Has the patient been a risk to others in the past 6 months? No Has the patient been a risk to others within the distant past? No   PTA Medications: (Not in a hospital admission)   Long Term Goals: Improvement in symptoms so as ready for discharge  Short Term Goals: Patient will verbalize feelings in meetings with treatment team members. and Patient will take medications as prescribed daily.  Medical Decision Making   [name] [name] is a 62 year old male with past psychiatric history of polysubstance abuse, depression, and bipolar disorder. Patient presented to the [facility] primarily for alcohol detox. Patient has extensive polysubstance use history and reports to recently using alcohol, marijuana, cocaine, and heroin throughout the past month. Patient was cooperative upon presentation, though his mood was dysphoric. Home medications were started. Patient was regularly monitored for alcohol withdrawal and as needed agitation protocol remained in place. Patient agreeable to residential rehab.    ASSESSMENT & PLAN:  Alcohol use disorder, severe, recurrent  Alcohol withdrawal  Polysubstance abuse  Safety checks every 15 min  As needed agitation protocol in place  Continue PRN acetaminophen 650 mg q6 hours for mild-moderate pain, fever, or headache  Continue PRN hydroxyzine 25 mg TID for anxiety Continue Remeron 15 mg at bedtime for sleep   Substance induced mood disorder Continue Celexa 20 mg   Left hip pain  Initiate naproxen 500 mg BID PRN mild-moderate pain  Lidocaine patch ordered   Medical management: continue home regimen  amlodipine 5 mg daily and lisinopril 10 mg daily for HTN, metformin 500 mg BID and glipizide 5 mg daily for diabetes, gemfibrozil 600 mg BID for hypertriglyceridemia, pantoprazole 40 mg daily  for GERD, tamsulosin 0.4 mg daily for BPH, and doxepin 10 mg BID for sleep initiation/maintenance  Pain management: continue home regimen for chronic medical conditions  Cymbalta 60 mg daily, gabapentin 600 mg TID, Mobic 15 mg daily, robaxin 750 TID  Bowel Regimen:  Bismuth subsalicylate 524 mg oral chewable tablet every 3 hours PRN for diarrhea/loose stools Senna 8.6 mg oral at bedtime and polyethylene glycol 17 g oral daily PRN for mild to moderate constipation Aluminum-Mg hydroxide + simethicone 30 mL every 4  hours PRN for heartburn or indigestion   Disposition: pending residential rehab vs IOP placement Recommendations  Based on my evaluation the patient does not appear to have an emergency medical condition.  Keene Breath, Medical Student 08/12/23  11:44 AM   Augusto Gamble, MD 08/12/23  12:58 PM

## 2023-08-13 DIAGNOSIS — F1994 Other psychoactive substance use, unspecified with psychoactive substance-induced mood disorder: Secondary | ICD-10-CM | POA: Diagnosis not present

## 2023-08-13 DIAGNOSIS — F1090 Alcohol use, unspecified, uncomplicated: Secondary | ICD-10-CM | POA: Diagnosis not present

## 2023-08-13 LAB — GLUCOSE, CAPILLARY
Glucose-Capillary: 203 mg/dL — ABNORMAL HIGH (ref 70–99)
Glucose-Capillary: 157 mg/dL — ABNORMAL HIGH (ref 70–99)

## 2023-08-13 LAB — PROLACTIN: Prolactin: 6.8 ng/mL (ref 3.6–25.2)

## 2023-08-13 NOTE — Group Note (Signed)
Group Topic: Emotional Regulation  Group Date: 08/13/2023 Start Time: 1130 End Time: 1145 Facilitators: Priscille Kluver, NT  Department: [facility]  Number of Participants: 0   Name: [name] [name]. Date of Birth: 10-22-1961  MR: [id number]    Level of Participation: Level of Participation: Pt did not attend group  Patients Problems:  Patient Active Problem List   Diagnosis Date Noted   Alcohol withdrawal (HCC) 08/12/2023   Polysubstance abuse (HCC) 08/11/2023   Substance induced mood disorder (HCC) 01/19/2021   Alcohol use disorder, severe, dependence (HCC) 01/05/2021   Cocaine use disorder, moderate, dependence (HCC) 01/05/2021   Depression 01/05/2021   Type 2 diabetes mellitus with diabetic neuropathy, without long-term current use of insulin (HCC) 09/21/2020   Septic arthritis (HCC) 08/09/2019   S/P right knee arthroscopy 07/29/2019 08/03/2019   Acute lateral meniscus tear of right knee    Acute medial meniscus tear of right knee    Substance use disorder 03/16/2019   History of diabetes mellitus 10/10/2016   Avascular necrosis of hip, left (HCC) 10/04/2016   Status post left hip replacement 10/04/2016   Rash and nonspecific skin eruption 08/12/2013   Cholelithiases 08/04/2013   Subcutaneous nodule 07/21/2013   Acute alcoholic pancreatitis 04/24/2013   Syncope 04/11/2013   Prolonged Q-T interval on ECG 04/11/2013   Cocaine abuse (HCC) 03/13/2013    Class: Chronic   At risk for adverse drug event 02/14/2013   DDD (degenerative disc disease), lumbar 02/01/2013   Dyspepsia 12/01/2012   BPH (benign prostatic hyperplasia) 10/07/2012   Allergic rhinitis 10/03/2012   Transaminitis 10/02/2012   Chronic pain syndrome 10/02/2012   Essential hypertension, benign 07/01/2012   Tobacco user 07/01/2012   Hepatic steatosis 03/06/2012   ED (erectile dysfunction) 03/06/2012   Pseudocyst of pancreas 02/28/2012   Alcohol abuse 02/25/2012   GERD  (gastroesophageal reflux disease) 02/23/2012   Bipolar 1 disorder (HCC) 02/23/2012   Hypertriglyceridemia 12/05/2011

## 2023-08-13 NOTE — Progress Notes (Signed)
Pt's CIWA was 0. 

## 2023-08-13 NOTE — Progress Notes (Signed)
Pt was visible in the milieu. No distress noted or concerns voiced. Staff will monitor for pt's safety.

## 2023-08-13 NOTE — ED Provider Notes (Signed)
Behavioral Health Progress Note  Date and Time: 08/13/2023 8:59 AM Name: [name] Ny. MRN:  [id number]  Subjective: Patient says his sugars are high because the food we have here is too sweet. He says he would like a list of facilities to call. He says he has all the numbers in his locker. I informed him I would let social work know he wants a list to which he was amenable. He denies experiencing any cravings for any substances at this time. He denies experiencing any withdrawal symptoms at this time.   Patient does not endorse any side-effects they attribute to medications. Patient does not endorse any somatic complaints  Diagnosis:  Final diagnoses:  Alcohol withdrawal syndrome without complication (HCC)    Total Time spent with patient: 45 minutes   Previous psychiatric diagnoses: Bipolar disorder, depression, polysubstance abuse   Psychotropic medication trials: Celexa 20 mg, Zyprexa 20 mg, Remeron 15 mg  Current psychiatric medications: Patient reports that he stopped medications about 1 week prior to admission    Past Medical History: Hypertriglyceridemia, GERD, HTN, chronic pain syndrome, BPH, degenerative disc disease, type 2 diabetes mellitus Allergies: Bee venom and Penicillins Family History: not discussed at this time  Social History: Homeless and unemployed (disability since 2013 for back pain). Single with 1 child. Previously lived in [place], [place]. Family in [place].  Substance Use History:  Tobacco: uses 1 pack of cigarettes over a 3 day period  Alcohol: daily (1 pint of liquor and 12 pack of beer) Marijuana: daily  Cocaine: 0.5 g daily  Opioids: Heroin (occasional, 2 times in the past month) Rehab: [facility], [facility]  Additional Social History:    Sleep: Good  Appetite: Good  Current Medications: Current Facility-Administered Medications  Medication Dose Route Frequency Provider Last Rate Last Admin   acetaminophen (TYLENOL) tablet 650  mg  650 mg Oral Q6H PRN Augusto Gamble, MD   650 mg at 08/13/23 0100   alum & mag hydroxide-simeth (MAALOX/MYLANTA) 200-200-20 MG/5ML suspension 30 mL  30 mL Oral Q4H PRN Augusto Gamble, MD       amLODipine (NORVASC) tablet 5 mg  5 mg Oral Daily Rankin, Shuvon B, NP   5 mg at 08/13/23 4098   bismuth subsalicylate (PEPTO BISMOL) chewable tablet 524 mg  524 mg Oral Q3H PRN Augusto Gamble, MD   524 mg at 08/12/23 1601   chlordiazePOXIDE (LIBRIUM) capsule 25 mg  25 mg Oral Q4H PRN Augusto Gamble, MD       citalopram (CELEXA) tablet 20 mg  20 mg Oral Daily Rankin, Shuvon B, NP   20 mg at 08/13/23 1191   doxepin (SINEQUAN) capsule 10 mg  10 mg Oral BID Rankin, Shuvon B, NP   10 mg at 08/13/23 4782   DULoxetine (CYMBALTA) DR capsule 60 mg  60 mg Oral Daily Rankin, Shuvon B, NP   60 mg at 08/13/23 0821   fluticasone (FLONASE) 50 MCG/ACT nasal spray 1 spray  1 spray Each Nare Daily PRN Rankin, Shuvon B, NP   1 spray at 08/13/23 0828   gabapentin (NEURONTIN) capsule 600 mg  600 mg Oral TID Rankin, Shuvon B, NP   600 mg at 08/13/23 0821   gemfibrozil (LOPID) tablet 600 mg  600 mg Oral BID AC Rankin, Shuvon B, NP   600 mg at 08/13/23 0822   glipiZIDE (GLUCOTROL) tablet 5 mg  5 mg Oral Daily Rankin, Shuvon B, NP   5 mg at 08/13/23 9562   hydrOXYzine (ATARAX) tablet  25 mg  25 mg Oral TID PRN Augusto Gamble, MD       lidocaine (LIDODERM) 5 % 1 patch  1 patch Transdermal Q24H Augusto Gamble, MD   1 patch at 08/12/23 1237   lisinopril (ZESTRIL) tablet 10 mg  10 mg Oral Daily Rankin, Shuvon B, NP   10 mg at 08/13/23 1610   meloxicam (MOBIC) tablet 15 mg  15 mg Oral Daily Rankin, Shuvon B, NP   15 mg at 08/13/23 9604   metFORMIN (GLUCOPHAGE) tablet 500 mg  500 mg Oral BID WC Rankin, Shuvon B, NP   500 mg at 08/13/23 5409   methocarbamol (ROBAXIN) tablet 750 mg  750 mg Oral TID Rankin, Shuvon B, NP   750 mg at 08/13/23 8119   mirtazapine (REMERON) tablet 15 mg  15 mg Oral QHS Rankin, Shuvon B, NP   15 mg at 08/12/23 2114   thiamine  (VITAMIN B1) tablet 100 mg  100 mg Oral Daily Augusto Gamble, MD   100 mg at 08/13/23 1478   And   multivitamin with minerals tablet 1 tablet  1 tablet Oral Daily Augusto Gamble, MD   1 tablet at 08/13/23 2956   naproxen (NAPROSYN) tablet 500 mg  500 mg Oral BID PRN Augusto Gamble, MD   500 mg at 08/13/23 0827   pantoprazole (PROTONIX) EC tablet 40 mg  40 mg Oral Daily Rankin, Shuvon B, NP   40 mg at 08/13/23 2130   polyethylene glycol (MIRALAX / GLYCOLAX) packet 17 g  17 g Oral Daily PRN Augusto Gamble, MD       senna (SENOKOT) tablet 8.6 mg  1 tablet Oral QHS PRN Augusto Gamble, MD       tamsulosin [facility]) capsule 0.4 mg  0.4 mg Oral Daily Rankin, Shuvon B, NP   0.4 mg at 08/13/23 8657   Current Outpatient Medications  Medication Sig Dispense Refill   amLODipine (NORVASC) 5 MG tablet Take 1 tablet (5 mg total) by mouth daily. 30 tablet 0   citalopram (CELEXA) 20 MG tablet Take 1 tablet (20 mg total) by mouth daily. 30 tablet 0   doxepin (SINEQUAN) 10 MG capsule Take 10 mg by mouth 2 (two) times daily.     DULoxetine (CYMBALTA) 60 MG capsule Take 60 mg by mouth daily.     fluticasone (FLONASE) 50 MCG/ACT nasal spray Place 1 spray into both nostrils daily as needed for allergies or rhinitis.     gabapentin (NEURONTIN) 300 MG capsule Take 600 mg by mouth 3 (three) times daily.     gemfibrozil (LOPID) 600 MG tablet Take 600 mg by mouth 2 (two) times daily before a meal.     glipiZIDE (GLUCOTROL) 5 MG tablet Take 1 tablet (5 mg total) by mouth daily. 30 tablet 0   insulin glargine (LANTUS) 100 UNIT/ML injection Inject 0.2 mLs (20 Units total) into the skin daily. (Patient taking differently: Inject 2-8 Units into the skin daily as needed. Sliding scale 2-8) 10 mL 11   lisinopril (ZESTRIL) 10 MG tablet Take 10 mg by mouth daily.     loratadine (CLARITIN) 10 MG tablet Take 1 tablet (10 mg total) by mouth daily. (Patient taking differently: Take 10 mg by mouth daily as needed for allergies.) 30 tablet 0    meloxicam (MOBIC) 15 MG tablet Take 15 mg by mouth daily.     metFORMIN (GLUCOPHAGE) 500 MG tablet Take 2 tablets (1,000 mg total) by mouth 2 (two) times daily with a meal. (  Patient taking differently: Take 500 mg by mouth 2 (two) times daily with a meal.) 60 tablet 0   methocarbamol (ROBAXIN) 750 MG tablet Take 750 mg by mouth 3 (three) times daily.     mirtazapine (REMERON) 15 MG tablet Take 15 mg by mouth at bedtime.     OLANZapine (ZYPREXA) 20 MG tablet Take 20 mg by mouth in the morning and at bedtime.     omeprazole (PRILOSEC) 40 MG capsule Take 40 mg by mouth daily.     tamsulosin (FLOMAX) 0.4 MG CAPS capsule Take 1 capsule (0.4 mg total) by mouth daily. 30 capsule 0    Labs  Lab Results:     Latest Ref Rng & Units 08/11/2023    4:04 PM 07/07/2023   12:50 PM 05/18/2021   12:08 AM  CBC  WBC 4.0 - 10.5 K/uL 6.3  6.4  5.9   Hemoglobin 13.0 - 17.0 g/dL 29.5  28.4  13.2   Hematocrit 39.0 - 52.0 % 47.0  45.3  38.3   Platelets 150 - 400 K/uL 302  446  235       Latest Ref Rng & Units 08/11/2023    4:04 PM 07/07/2023   12:50 PM 05/18/2021   12:08 AM  CMP  Glucose 70 - 99 mg/dL 440  85  102   BUN 6 - 20 mg/dL 20  12  20    Creatinine 0.61 - 1.24 mg/dL 7.25  3.66  4.40   Sodium 135 - 145 mmol/L 139  140  140   Potassium 3.5 - 5.1 mmol/L 4.5  3.7  3.7   Chloride 98 - 111 mmol/L 97  105  107   CO2 22 - 32 mmol/L 27  26  24    Calcium 8.9 - 10.3 mg/dL 9.7  9.1  8.7   Total Protein 6.5 - 8.1 g/dL 7.1  7.2  6.0   Total Bilirubin 0.3 - 1.2 mg/dL 0.3  0.7  0.5   Alkaline Phos 38 - 126 U/L 41  30  29   AST 15 - 41 U/L 34  26  21   ALT 0 - 44 U/L 17  20  22      Blood Alcohol level:  Lab Results  Component Value Date   ETH <10 08/11/2023   ETH <10 07/07/2023   Metabolic Disorder Labs: Lab Results  Component Value Date   HGBA1C 6.9 (H) 08/11/2023   MPG 151.33 08/11/2023   MPG 114 07/09/2023   Lab Results  Component Value Date   PROLACTIN 6.8 08/11/2023   Lab Results  Component  Value Date   CHOL 244 (H) 08/11/2023   TRIG 528 (H) 08/11/2023   HDL 79 08/11/2023   CHOLHDL 3.1 08/11/2023   VLDL UNABLE TO CALCULATE IF TRIGLYCERIDE OVER 400 mg/dL 34/74/2595   LDLCALC UNABLE TO CALCULATE IF TRIGLYCERIDE OVER 400 mg/dL 63/87/5643   LDLCALC 90 06/28/2013   Therapeutic Lab Levels: No results found for: "LITHIUM" No results found for: "VALPROATE" No results found for: "CBMZ" Physical Findings   AUDIT    Flowsheet Row Ad[facility] (Discharged) [facility]  Alcohol Use Disorder Identification Test Final Score (AUDIT) 27      PHQ2-9    Flowsheet Row ED from 08/11/2023 in [facility] ED from 12/31/2020 in [facility]  PHQ-2 Total Score 1 2  PHQ-9 Total Score -- 16      Flowsheet  Row ED from 08/11/2023 in [facility] ED from 07/09/2023 in [facility] ED from 07/08/2023 in [facility]  C-SSRS RISK CATEGORY No Risk No Risk No Risk       Musculoskeletal  Strength & Muscle Tone: within normal limits Gait & Station: normal Patient leans: N/A  Psychiatric Specialty Exam  Presentation  General Appearance: Appropriate for Environment   Eye Contact:Good   Speech:Clear and Coherent; Normal Rate   Speech Volume:Normal   Handedness:Right   Mood and Affect  Mood:-- ("my sugars are high")   Affect:Appropriate   Thought Process  Thought Processes:Coherent; Goal Directed   Descriptions of Associations:Intact   Orientation:Full (Time, Place and Person)   Thought Content:Logical  Diagnosis of Schizophrenia or Schizoaffective disorder in past: No data recorded   Hallucinations:No data recorded  Ideas of Reference:None   Suicidal Thoughts:No data recorded  Homicidal Thoughts:No data recorded  Sensorium  Memory:Immediate Good;  Recent Good   Judgment:Intact   Insight:Lacking   Executive Functions  Concentration:Good   Attention Span:Good   Recall:Good   Fund of Knowledge:Good   Language:Good   Psychomotor Activity  Psychomotor Activity:No data recorded  Assets  Assets:Communication Skills; Desire for Improvement; Physical Health   Sleep  Sleep:Sleep: Good   No data recorded  Physical Exam  Physical Exam Vitals and nursing note reviewed.  HENT:     Head: Normocephalic and atraumatic.  Pulmonary:     Effort: Pulmonary effort is normal.  Musculoskeletal:     Cervical back: Normal range of motion.  Neurological:     General: No focal deficit present.     Mental Status: He is alert. Mental status is at baseline.    Review of Systems  Constitutional: Negative.   Respiratory: Negative.    Cardiovascular: Negative.   Gastrointestinal: Negative.   Genitourinary: Negative.   Psychiatric/Behavioral:         Psychiatric subjective data addressed in PSE or HPI / daily subjective report   Blood pressure 113/81, pulse 70, temperature 98.1 F (36.7 C), temperature source Oral, resp. rate 16, SpO2 99%. There is no height or weight on file to calculate BMI.  Treatment Plan Summary: Daily contact with patient to assess and evaluate symptoms and progress in treatment and Medication management: -- continue home citalopram 20 mg daily for depressive symptoms -- continue home duloxetine 60 mg daily for depressive symptoms and neuropathic pain -- continue home gabapentin 600 mg TID for neuropathic pain and alcohol cravings -- continue home mirtazapine 15 mg at bedtime for depressive symptoms and sleep              -- CIWA with as needed chlordiazepoxide protocol              -- medical regimen: amlodipine 5 mg daily and lisinopril 10 mg daily for HTN, metformin 500 mg BID and glipizide 5 mg daily for diabetes, gemfibrozil 600 mg BID for hypertriglyceridemia, pantoprazole 40 mg daily for GERD,  tamsulosin 0.4 mg daily for BPH, and doxepin 10 mg BID for sleep initiation/maintenance, meloxicam 15 mg daily and methocarbamol 750 TID for pain -- Patient does not need nicotine replacement  PRNs              -- continue acetaminophen 650 mg every 6 hours as needed for mild to moderate pain, fever, and headaches              -- continue hydroxyzine 25  mg three times a day as needed for anxiety              -- continue bismuth subsalicylate 524 mg oral chewable tablet every 3 hours as needed for diarrhea / loose stools              -- continue senna 8.6 mg oral at bedtime and polyethylene glycol 17 g oral daily as needed for mild to moderate constipation              -- continue ondansetron 8 mg every 8 hours as needed for nausea or vomiting              -- continue aluminum-magnesium hydroxide + simethicone 30 mL every 4 hours as needed for heartburn or indigestion -- continue naproxen 500 mg BID PRN mild-moderate pain   -- As needed agitation protocol in-place  Augusto Gamble, MD 08/13/23 8:59 AM

## 2023-08-13 NOTE — ED Notes (Signed)
Pt is currently sleeping, no distress noted, environmental check complete, will continue to monitor patient for safety.  

## 2023-08-13 NOTE — Progress Notes (Signed)
Pt is awake, alert and oriented X3. Pt complained of generalized pain. No signs of acute distress noted. PRN Naproxen and scheduled meds administered with no issue. Pt denies current SI/HI/AVH, plan or intent. Staff will monitor for pt's safety.

## 2023-08-13 NOTE — Group Note (Signed)
Group Topic: Relaxation  Group Date: 08/13/2023 Start Time: 1030 End Time: 1035 Facilitators: [name], RN  Department: [facility]  Number of Participants: 0  Group Focus: relaxation Treatment Modality:  Eclectic Therapy Interventions utilized were none Purpose: Dog therapy  Name: [name] [name]. Date of Birth: [birth year]  MR: [id number]    Level of Participation: Pt did not attend dog therapy. Quality of Participation: N/A Interactions with others: N/A Mood/Affect: N/A Triggers (if applicable): N/A Cognition: N/A Progress: N/A Response: N/A Plan: follow-up needed  Patients Problems:  Patient Active Problem List   Diagnosis Date Noted   Alcohol withdrawal (HCC) 08/12/2023   Polysubstance abuse (HCC) 08/11/2023   Substance induced mood disorder (HCC) 01/19/2021   Alcohol use disorder, severe, dependence (HCC) 01/05/2021   Cocaine use disorder, moderate, dependence (HCC) 01/05/2021   Depression 01/05/2021   Type 2 diabetes mellitus with diabetic neuropathy, without long-term current use of insulin (HCC) 09/21/2020   Septic arthritis (HCC) 08/09/2019   S/P right knee arthroscopy 07/29/2019 08/03/2019   Acute lateral meniscus tear of right knee    Acute medial meniscus tear of right knee    Substance use disorder 03/16/2019   History of diabetes mellitus 10/10/2016   Avascular necrosis of hip, left (HCC) 10/04/2016   Status post left hip replacement 10/04/2016   Rash and nonspecific skin eruption 08/12/2013   Cholelithiases 08/04/2013   Subcutaneous nodule 07/21/2013   Acute alcoholic pancreatitis 04/24/2013   Syncope 04/11/2013   Prolonged Q-T interval on ECG 04/11/2013   Cocaine abuse (HCC) 03/13/2013    Class: Chronic   At risk for adverse drug event 02/14/2013   DDD (degenerative disc disease), lumbar 02/01/2013   Dyspepsia 12/01/2012   BPH (benign prostatic hyperplasia) 10/07/2012   Allergic rhinitis 10/03/2012   Transaminitis  10/02/2012   Chronic pain syndrome 10/02/2012   Essential hypertension, benign 07/01/2012   Tobacco user 07/01/2012   Hepatic steatosis 03/06/2012   ED (erectile dysfunction) 03/06/2012   Pseudocyst of pancreas 02/28/2012   Alcohol abuse 02/25/2012   GERD (gastroesophageal reflux disease) 02/23/2012   Bipolar 1 disorder (HCC) 02/23/2012   Hypertriglyceridemia 12/05/2011

## 2023-08-13 NOTE — Care Management (Signed)
[facility] Care Management   Writer re-faxed referral information to [facility], [facility], [facility].    Writer faxed referral to [facility] and [facility].  Patient reports that he has been to [facility] and [facility] in the past.   Clinical research associate informed the patient that [facility] and [facility] have denied the patient due to his [employer].

## 2023-08-13 NOTE — Group Note (Signed)
Group Topic: Relapse and Recovery  Group Date: 08/13/2023 Start Time: 2000 End Time: 2100 Facilitators: Rae Lips B  Department: [facility]  Number of Participants: 4  Group Focus: abuse issues, acceptance, and activities of daily living skills Treatment Modality:  Leisure Development Interventions utilized were leisure development Purpose: enhance coping skills, express feelings, and relapse prevention strategies  Name: [name] [name]. Date of Birth: 11-13-1961  MR: [id number]    Level of Participation: Pt attended group for 5 mins and then went inside.  Quality of Participation: attentive Interactions with others: didn't talk  Mood/Affect: appropriate Triggers (if applicable): NA Cognition: coherent/clear Progress: None Response: NA Plan: patient will be encouraged to keep going to groups.   Patients Problems:  Patient Active Problem List   Diagnosis Date Noted   Alcohol withdrawal (HCC) 08/12/2023   Polysubstance abuse (HCC) 08/11/2023   Substance induced mood disorder (HCC) 01/19/2021   Alcohol use disorder, severe, dependence (HCC) 01/05/2021   Cocaine use disorder, moderate, dependence (HCC) 01/05/2021   Depression 01/05/2021   Type 2 diabetes mellitus with diabetic neuropathy, without long-term current use of insulin (HCC) 09/21/2020   Septic arthritis (HCC) 08/09/2019   S/P right knee arthroscopy 07/29/2019 08/03/2019   Acute lateral meniscus tear of right knee    Acute medial meniscus tear of right knee    Substance use disorder 03/16/2019   History of diabetes mellitus 10/10/2016   Avascular necrosis of hip, left (HCC) 10/04/2016   Status post left hip replacement 10/04/2016   Rash and nonspecific skin eruption 08/12/2013   Cholelithiases 08/04/2013   Subcutaneous nodule 07/21/2013   Acute alcoholic pancreatitis 04/24/2013   Syncope 04/11/2013   Prolonged Q-T interval on ECG 04/11/2013   Cocaine abuse (HCC) 03/13/2013     Class: Chronic   At risk for adverse drug event 02/14/2013   DDD (degenerative disc disease), lumbar 02/01/2013   Dyspepsia 12/01/2012   BPH (benign prostatic hyperplasia) 10/07/2012   Allergic rhinitis 10/03/2012   Transaminitis 10/02/2012   Chronic pain syndrome 10/02/2012   Essential hypertension, benign 07/01/2012   Tobacco user 07/01/2012   Hepatic steatosis 03/06/2012   ED (erectile dysfunction) 03/06/2012   Pseudocyst of pancreas 02/28/2012   Alcohol abuse 02/25/2012   GERD (gastroesophageal reflux disease) 02/23/2012   Bipolar 1 disorder (HCC) 02/23/2012   Hypertriglyceridemia 12/05/2011

## 2023-08-13 NOTE — ED Notes (Signed)
Patient is sleeping. Respirations equal and unlabored, skin warm and dry. No change in assessment or acuity. Routine safety checks conducted according to facility protocol. Will continue to monitor for safety.   

## 2023-08-13 NOTE — Progress Notes (Signed)
Pt's CIWA was 1. 

## 2023-08-13 NOTE — ED Notes (Signed)
Pt is in the dayroom watching TV with peers. Pt denies SI/HI/AVH. No acute distress noted. Will continue to monitor for safety and provide support.

## 2023-08-13 NOTE — Progress Notes (Signed)
Pt stated "I feel that my sugar is low and I am having a headache." CBG checked and it was 157mg /dl.

## 2023-08-14 ENCOUNTER — Encounter (HOSPITAL_COMMUNITY): Payer: Self-pay | Admitting: Registered Nurse

## 2023-08-14 DIAGNOSIS — F1090 Alcohol use, unspecified, uncomplicated: Secondary | ICD-10-CM | POA: Diagnosis not present

## 2023-08-14 DIAGNOSIS — F1994 Other psychoactive substance use, unspecified with psychoactive substance-induced mood disorder: Secondary | ICD-10-CM | POA: Diagnosis not present

## 2023-08-14 NOTE — ED Notes (Signed)
Pt was provided breakfast.

## 2023-08-14 NOTE — ED Notes (Signed)
Patient is sleeping. Respirations equal and unlabored, skin warm and dry. No change in assessment or acuity. Routine safety checks conducted according to facility protocol. Will continue to monitor for safety.   

## 2023-08-14 NOTE — ED Notes (Signed)
Pt was provided dinner.

## 2023-08-14 NOTE — ED Notes (Signed)
Woke pt up bc of opening the door. He apparent pushed the chair against the door so we couldn't get in. RN and MHT opened the door.

## 2023-08-14 NOTE — Progress Notes (Signed)
Pt is awake, alert and oriented X3. Pt complained of left hip pain. No signs of acute distress noted. Administered scheduled meds with no issue. Pt denies current SI/HI/AVH, plan or intent. Staff will monitor for pt's safety.

## 2023-08-14 NOTE — ED Notes (Signed)
Pt requested for medication for back pain. Medication was administered to that effect.

## 2023-08-14 NOTE — Group Note (Signed)
Group Topic: Wellness  Group Date: 08/14/2023 Start Time: 1130 End Time: 1200 Facilitators: [name], RN  Department: [facility]  Number of Participants: 5  Group Focus: relapse prevention and self-awareness Treatment Modality:  Psychoeducation Interventions utilized were patient education, problem solving, and reality testing Purpose: express irrational fears, increase insight, and reinforce self-care  Name: [name] [name]. Date of Birth: Sep 27, 1961  MR: [id number]    Level of Participation: active Quality of Participation: attentive, cooperative, engaged, and offered feedback Interactions with others: gave feedback Mood/Affect: appropriate Triggers (if applicable): none Cognition: coherent/clear, goal directed, insightful, and logical Progress: Gaining insight Response: Pt participated in group and gave positive feedback. Plan: follow-up needed  Patients Problems:  Patient Active Problem List   Diagnosis Date Noted   Alcohol withdrawal (HCC) 08/12/2023   Polysubstance abuse (HCC) 08/11/2023   Substance induced mood disorder (HCC) 01/19/2021   Alcohol use disorder, severe, dependence (HCC) 01/05/2021   Cocaine use disorder, moderate, dependence (HCC) 01/05/2021   Depression 01/05/2021   Type 2 diabetes mellitus with diabetic neuropathy, without long-term current use of insulin (HCC) 09/21/2020   Septic arthritis (HCC) 08/09/2019   S/P right knee arthroscopy 07/29/2019 08/03/2019   Acute lateral meniscus tear of right knee    Acute medial meniscus tear of right knee    Substance use disorder 03/16/2019   History of diabetes mellitus 10/10/2016   Avascular necrosis of hip, left (HCC) 10/04/2016   Status post left hip replacement 10/04/2016   Rash and nonspecific skin eruption 08/12/2013   Cholelithiases 08/04/2013   Subcutaneous nodule 07/21/2013   Acute alcoholic pancreatitis 04/24/2013   Syncope 04/11/2013   Prolonged Q-T interval on ECG  04/11/2013   Cocaine abuse (HCC) 03/13/2013    Class: Chronic   At risk for adverse drug event 02/14/2013   DDD (degenerative disc disease), lumbar 02/01/2013   Dyspepsia 12/01/2012   BPH (benign prostatic hyperplasia) 10/07/2012   Allergic rhinitis 10/03/2012   Transaminitis 10/02/2012   Chronic pain syndrome 10/02/2012   Essential hypertension, benign 07/01/2012   Tobacco user 07/01/2012   Hepatic steatosis 03/06/2012   ED (erectile dysfunction) 03/06/2012   Pseudocyst of pancreas 02/28/2012   Alcohol abuse 02/25/2012   GERD (gastroesophageal reflux disease) 02/23/2012   Bipolar 1 disorder (HCC) 02/23/2012   Hypertriglyceridemia 12/05/2011

## 2023-08-14 NOTE — Group Note (Signed)
Group Topic: Social Support  Group Date: 08/14/2023 Start Time: 1000 End Time: 1030 Facilitators: Londell Moh, NT  Department: [facility]  Number of Participants: 5  Group Focus: affirmation Treatment Modality:  Psychoeducation Interventions utilized were patient education Purpose: increase insight  Name: [name] [name]. Date of Birth: [birth year]  MR: [id number]    Level of Participation: Pt did not attend group.   Patients Problems:  Patient Active Problem List   Diagnosis Date Noted   Alcohol withdrawal (HCC) 08/12/2023   Polysubstance abuse (HCC) 08/11/2023   Substance induced mood disorder (HCC) 01/19/2021   Alcohol use disorder, severe, dependence (HCC) 01/05/2021   Cocaine use disorder, moderate, dependence (HCC) 01/05/2021   Depression 01/05/2021   Type 2 diabetes mellitus with diabetic neuropathy, without long-term current use of insulin (HCC) 09/21/2020   Septic arthritis (HCC) 08/09/2019   S/P right knee arthroscopy 07/29/2019 08/03/2019   Acute lateral meniscus tear of right knee    Acute medial meniscus tear of right knee    Substance use disorder 03/16/2019   History of diabetes mellitus 10/10/2016   Avascular necrosis of hip, left (HCC) 10/04/2016   Status post left hip replacement 10/04/2016   Rash and nonspecific skin eruption 08/12/2013   Cholelithiases 08/04/2013   Subcutaneous nodule 07/21/2013   Acute alcoholic pancreatitis 04/24/2013   Syncope 04/11/2013   Prolonged Q-T interval on ECG 04/11/2013   Cocaine abuse (HCC) 03/13/2013    Class: Chronic   At risk for adverse drug event 02/14/2013   DDD (degenerative disc disease), lumbar 02/01/2013   Dyspepsia 12/01/2012   BPH (benign prostatic hyperplasia) 10/07/2012   Allergic rhinitis 10/03/2012   Transaminitis 10/02/2012   Chronic pain syndrome 10/02/2012   Essential hypertension, benign 07/01/2012   Tobacco user 07/01/2012   Hepatic steatosis 03/06/2012   ED  (erectile dysfunction) 03/06/2012   Pseudocyst of pancreas 02/28/2012   Alcohol abuse 02/25/2012   GERD (gastroesophageal reflux disease) 02/23/2012   Bipolar 1 disorder (HCC) 02/23/2012   Hypertriglyceridemia 12/05/2011

## 2023-08-14 NOTE — ED Provider Notes (Signed)
Behavioral Health Progress Note  Date and Time: 08/14/2023 9:54 AM Name: [name] [name]. MRN:  [id number]  Subjective: Patient says he called [facility] [place] yesterday and he was informed they would send paperwork for him to fill out. He remains committed to residential placement. He says he had some mild withdrawals yesterday. He denies experiencing any cravings for any substances at this time. He denies experiencing any withdrawal symptoms at this time.   Patient does not endorse any side-effects they attribute to medications. Patient does not endorse any somatic complaints  Diagnosis:  Final diagnoses:  Alcohol withdrawal syndrome without complication (HCC)    Total Time spent with patient: 45 minutes   Previous psychiatric diagnoses: Bipolar disorder, depression, polysubstance abuse   Psychotropic medication trials: Celexa 20 mg, Zyprexa 20 mg, Remeron 15 mg  Current psychiatric medications: Patient reports that he stopped medications about 1 week prior to admission    Past Medical History: Hypertriglyceridemia, GERD, HTN, chronic pain syndrome, BPH, degenerative disc disease, type 2 diabetes mellitus Allergies: Bee venom and Penicillins Family History: not discussed at this time  Social History: Homeless and unemployed (disability since 2013 for back pain). Single with 1 child. Previously lived in [place], [place]. Family in [place].  Substance Use History:  Tobacco: uses 1 pack of cigarettes over a 3 day period  Alcohol: daily (1 pint of liquor and 12 pack of beer) Marijuana: daily  Cocaine: 0.5 g daily  Opioids: Heroin (occasional, 2 times in the past month) Rehab: [facility], [facility]  Additional Social History:    Sleep: Good  Appetite: Good  Current Medications: Current Facility-Administered Medications  Medication Dose Route Frequency Provider Last Rate Last Admin   acetaminophen (TYLENOL) tablet 650 mg  650 mg Oral Q6H PRN Augusto Gamble, MD   650 mg at  08/14/23 0520   alum & mag hydroxide-simeth (MAALOX/MYLANTA) 200-200-20 MG/5ML suspension 30 mL  30 mL Oral Q4H PRN Augusto Gamble, MD       amLODipine (NORVASC) tablet 5 mg  5 mg Oral Daily Rankin, Shuvon B, NP   5 mg at 08/14/23 0847   bismuth subsalicylate (PEPTO BISMOL) chewable tablet 524 mg  524 mg Oral Q3H PRN Augusto Gamble, MD   524 mg at 08/12/23 1601   chlordiazePOXIDE (LIBRIUM) capsule 25 mg  25 mg Oral Q4H PRN Augusto Gamble, MD       citalopram (CELEXA) tablet 20 mg  20 mg Oral Daily Rankin, Shuvon B, NP   20 mg at 08/14/23 0846   doxepin (SINEQUAN) capsule 10 mg  10 mg Oral BID Rankin, Shuvon B, NP   10 mg at 08/14/23 0846   DULoxetine (CYMBALTA) DR capsule 60 mg  60 mg Oral Daily Rankin, Shuvon B, NP   60 mg at 08/14/23 0847   fluticasone (FLONASE) 50 MCG/ACT nasal spray 1 spray  1 spray Each Nare Daily PRN Rankin, Shuvon B, NP   1 spray at 08/13/23 0828   gabapentin (NEURONTIN) capsule 600 mg  600 mg Oral TID Rankin, Shuvon B, NP   600 mg at 08/14/23 0846   gemfibrozil (LOPID) tablet 600 mg  600 mg Oral BID AC Rankin, Shuvon B, NP   600 mg at 08/14/23 0847   glipiZIDE (GLUCOTROL) tablet 5 mg  5 mg Oral Daily Rankin, Shuvon B, NP   5 mg at 08/14/23 0847   hydrOXYzine (ATARAX) tablet 25 mg  25 mg Oral TID PRN Augusto Gamble, MD       lidocaine (LIDODERM) 5 %  1 patch  1 patch Transdermal Q24H Augusto Gamble, MD   1 patch at 08/13/23 1239   lisinopril (ZESTRIL) tablet 10 mg  10 mg Oral Daily Rankin, Shuvon B, NP   10 mg at 08/14/23 0846   meloxicam (MOBIC) tablet 15 mg  15 mg Oral Daily Rankin, Shuvon B, NP   15 mg at 08/14/23 0847   metFORMIN (GLUCOPHAGE) tablet 500 mg  500 mg Oral BID WC Rankin, Shuvon B, NP   500 mg at 08/14/23 0847   methocarbamol (ROBAXIN) tablet 750 mg  750 mg Oral TID Rankin, Shuvon B, NP   750 mg at 08/14/23 0846   mirtazapine (REMERON) tablet 15 mg  15 mg Oral QHS Rankin, Shuvon B, NP   15 mg at 08/13/23 2133   thiamine (VITAMIN B1) tablet 100 mg  100 mg Oral Daily Augusto Gamble, MD   100 mg at 08/14/23 5284   And   multivitamin with minerals tablet 1 tablet  1 tablet Oral Daily Augusto Gamble, MD   1 tablet at 08/14/23 0847   naproxen (NAPROSYN) tablet 500 mg  500 mg Oral BID PRN Augusto Gamble, MD   500 mg at 08/14/23 0522   pantoprazole (PROTONIX) EC tablet 40 mg  40 mg Oral Daily Rankin, Shuvon B, NP   40 mg at 08/14/23 0846   polyethylene glycol (MIRALAX / GLYCOLAX) packet 17 g  17 g Oral Daily PRN Augusto Gamble, MD       senna (SENOKOT) tablet 8.6 mg  1 tablet Oral QHS PRN Augusto Gamble, MD       tamsulosin [facility]) capsule 0.4 mg  0.4 mg Oral Daily Rankin, Shuvon B, NP   0.4 mg at 08/14/23 1324   Current Outpatient Medications  Medication Sig Dispense Refill   amLODipine (NORVASC) 5 MG tablet Take 1 tablet (5 mg total) by mouth daily. 30 tablet 0   citalopram (CELEXA) 20 MG tablet Take 1 tablet (20 mg total) by mouth daily. 30 tablet 0   doxepin (SINEQUAN) 10 MG capsule Take 10 mg by mouth 2 (two) times daily.     DULoxetine (CYMBALTA) 60 MG capsule Take 60 mg by mouth daily.     fluticasone (FLONASE) 50 MCG/ACT nasal spray Place 1 spray into both nostrils daily as needed for allergies or rhinitis.     gabapentin (NEURONTIN) 300 MG capsule Take 600 mg by mouth 3 (three) times daily.     gemfibrozil (LOPID) 600 MG tablet Take 600 mg by mouth 2 (two) times daily before a meal.     glipiZIDE (GLUCOTROL) 5 MG tablet Take 1 tablet (5 mg total) by mouth daily. 30 tablet 0   insulin glargine (LANTUS) 100 UNIT/ML injection Inject 0.2 mLs (20 Units total) into the skin daily. (Patient taking differently: Inject 2-8 Units into the skin daily as needed. Sliding scale 2-8) 10 mL 11   lisinopril (ZESTRIL) 10 MG tablet Take 10 mg by mouth daily.     loratadine (CLARITIN) 10 MG tablet Take 1 tablet (10 mg total) by mouth daily. (Patient taking differently: Take 10 mg by mouth daily as needed for allergies.) 30 tablet 0   meloxicam (MOBIC) 15 MG tablet Take 15 mg by mouth daily.      metFORMIN (GLUCOPHAGE) 500 MG tablet Take 2 tablets (1,000 mg total) by mouth 2 (two) times daily with a meal. (Patient taking differently: Take 500 mg by mouth 2 (two) times daily with a meal.) 60 tablet 0   methocarbamol (  ROBAXIN) 750 MG tablet Take 750 mg by mouth 3 (three) times daily.     mirtazapine (REMERON) 15 MG tablet Take 15 mg by mouth at bedtime.     OLANZapine (ZYPREXA) 20 MG tablet Take 20 mg by mouth in the morning and at bedtime.     omeprazole (PRILOSEC) 40 MG capsule Take 40 mg by mouth daily.     tamsulosin (FLOMAX) 0.4 MG CAPS capsule Take 1 capsule (0.4 mg total) by mouth daily. 30 capsule 0    Labs  Lab Results:     Latest Ref Rng & Units 08/11/2023    4:04 PM 07/07/2023   12:50 PM 05/18/2021   12:08 AM  CBC  WBC 4.0 - 10.5 K/uL 6.3  6.4  5.9   Hemoglobin 13.0 - 17.0 g/dL 69.6  29.5  28.4   Hematocrit 39.0 - 52.0 % 47.0  45.3  38.3   Platelets 150 - 400 K/uL 302  446  235       Latest Ref Rng & Units 08/11/2023    4:04 PM 07/07/2023   12:50 PM 05/18/2021   12:08 AM  CMP  Glucose 70 - 99 mg/dL 132  85  440   BUN 6 - 20 mg/dL 20  12  20    Creatinine 0.61 - 1.24 mg/dL 1.02  7.25  3.66   Sodium 135 - 145 mmol/L 139  140  140   Potassium 3.5 - 5.1 mmol/L 4.5  3.7  3.7   Chloride 98 - 111 mmol/L 97  105  107   CO2 22 - 32 mmol/L 27  26  24    Calcium 8.9 - 10.3 mg/dL 9.7  9.1  8.7   Total Protein 6.5 - 8.1 g/dL 7.1  7.2  6.0   Total Bilirubin 0.3 - 1.2 mg/dL 0.3  0.7  0.5   Alkaline Phos 38 - 126 U/L 41  30  29   AST 15 - 41 U/L 34  26  21   ALT 0 - 44 U/L 17  20  22      Blood Alcohol level:  Lab Results  Component Value Date   ETH <10 08/11/2023   ETH <10 07/07/2023   Metabolic Disorder Labs: Lab Results  Component Value Date   HGBA1C 6.9 (H) 08/11/2023   MPG 151.33 08/11/2023   MPG 114 07/09/2023   Lab Results  Component Value Date   PROLACTIN 6.8 08/11/2023   Lab Results  Component Value Date   CHOL 244 (H) 08/11/2023   TRIG 528 (H)  08/11/2023   HDL 79 08/11/2023   CHOLHDL 3.1 08/11/2023   VLDL UNABLE TO CALCULATE IF TRIGLYCERIDE OVER 400 mg/dL 44/02/4741   LDLCALC UNABLE TO CALCULATE IF TRIGLYCERIDE OVER 400 mg/dL 59/56/3875   LDLCALC 90 06/28/2013   Therapeutic Lab Levels: No results found for: "LITHIUM" No results found for: "VALPROATE" No results found for: "CBMZ" Physical Findings   AUDIT    Flowsheet Row Ad[facility] (Discharged[facility]  Alcohol Use Disorder Identification Test Final Score (AUDIT) 27      PHQ2-9    Flowsheet Row ED from 08/11/2023 in [facility] ED from 12/31/2020 in [facility]  PHQ-2 Total Score 1 2  PHQ-9 Total Score -- 16      Flowsheet Row ED from 08/11/2023 in [facility] ED from 07/09/2023 in [facility] ED  from 07/08/2023 in [facility] Emergency Department at [facility]  C-SSRS RISK CATEGORY No Risk No Risk No Risk       Musculoskeletal  Strength & Muscle Tone: within normal limits Gait & Station: normal Patient leans: N/A  Psychiatric Specialty Exam  Presentation  General Appearance: Appropriate for Environment; Well Groomed   Eye Contact:Good   Speech:Clear and Coherent; Normal Rate   Speech Volume:Normal   Handedness:Right   Mood and Affect  Mood:-- ("I'm good how bout you?")   Affect:Appropriate; Congruent; Full Range   Thought Process  Thought Processes:Coherent; Linear; Goal Directed   Descriptions of Associations:Intact   Orientation:Full (Time, Place and Person)   Thought Content:Logical; WDL  Diagnosis of Schizophrenia or Schizoaffective disorder in past: No data recorded   Hallucinations:Hallucinations: None   Ideas of Reference:None   Suicidal Thoughts:Suicidal Thoughts: No   Homicidal Thoughts:Homicidal Thoughts: No   Sensorium   Memory:Immediate Good; Recent Good; Remote Good   Judgment:Good   Insight:Good   Executive Functions  Concentration:Good   Attention Span:Good   Recall:Good   Fund of Knowledge:Good   Language:Good   Psychomotor Activity  Psychomotor Activity:Psychomotor Activity: Normal   Assets  Assets:Resilience   Sleep  Sleep:Sleep: Good   No data recorded  Physical Exam  Physical Exam Vitals and nursing note reviewed.  HENT:     Head: Normocephalic and atraumatic.  Pulmonary:     Effort: Pulmonary effort is normal.  Musculoskeletal:     Cervical back: Normal range of motion.  Neurological:     General: No focal deficit present.     Mental Status: He is alert. Mental status is at baseline.    Review of Systems  Constitutional: Negative.   Respiratory: Negative.    Cardiovascular: Negative.   Gastrointestinal: Negative.   Genitourinary: Negative.   Psychiatric/Behavioral:         Psychiatric subjective data addressed in PSE or HPI / daily subjective report   Blood pressure 128/87, pulse 88, temperature 98.2 F (36.8 C), temperature source Oral, resp. rate 16, SpO2 98%. There is no height or weight on file to calculate BMI.  Treatment Plan Summary: Daily contact with patient to assess and evaluate symptoms and progress in treatment and Medication management: -- continue home citalopram 20 mg daily for depressive symptoms -- continue home duloxetine 60 mg daily for depressive symptoms and neuropathic pain -- continue home gabapentin 600 mg TID for neuropathic pain and alcohol cravings -- continue home mirtazapine 15 mg at bedtime for depressive symptoms and sleep              -- CIWA with as needed chlordiazepoxide protocol              -- medical regimen: amlodipine 5 mg daily and lisinopril 10 mg daily for HTN, metformin 500 mg BID and glipizide 5 mg daily for diabetes, gemfibrozil 600 mg BID for hypertriglyceridemia, pantoprazole 40 mg daily for GERD,  tamsulosin 0.4 mg daily for BPH, and doxepin 10 mg BID for sleep initiation/maintenance, meloxicam 15 mg daily and methocarbamol 750 TID for pain -- Patient does not need nicotine replacement  PRNs              -- continue acetaminophen 650 mg every 6 hours as needed for mild to moderate pain, fever, and headaches              -- continue hydroxyzine 25 mg three times a day as needed for anxiety              --  continue bismuth subsalicylate 524 mg oral chewable tablet every 3 hours as needed for diarrhea / loose stools              -- continue senna 8.6 mg oral at bedtime and polyethylene glycol 17 g oral daily as needed for mild to moderate constipation              -- continue ondansetron 8 mg every 8 hours as needed for nausea or vomiting              -- continue aluminum-magnesium hydroxide + simethicone 30 mL every 4 hours as needed for heartburn or indigestion -- continue naproxen 500 mg BID PRN mild-moderate pain   -- As needed agitation protocol in-place  Augusto Gamble, MD 08/14/23 9:54 AM

## 2023-08-14 NOTE — Care Management (Addendum)
[facility] Care Management   Patient completed a phone interview with [facility].  Per Ephriam Knuckles at [facility] the referral was not received.  Writer re-faxed the referral packet.    11am  Writer was able to confirm that that referral for placement at [facility] has been received.  Per Mickle Plumb, they are in the process of reviewing the patient for placement.   1215pm  [facility] reports that the patient has been provisionally accepted pending a telephone screening.   Per Allysa with [facility], the patient would be responsible for paying $280.00 for the 28 days that he will receive inpatient treatment.  In addition, the facility does not provide transportation to the facility in [place]  The address to the facility is [address] [place] [address].    Writer will talk to the patient regarding the stipulations to the placement.  Writer will speak to leadership regarding transportation if the patient agrees to this placement and has the $280 to pay his portion not covered by his insurance agency.  1:57pm  Durenda Age with [facility] reports that they will not be able to review his referral packet until tomorrow 08-15-2023 Friday.  Writer will inform the patient.   2:17pm  Writer met with the patient.  Patient reports that he may be able to get the $280 if needed.  However, patient reports that he wants to hear from [facility] before making a decision about [facility].  Writer informed the MD and the RN working with the patient.

## 2023-08-14 NOTE — ED Notes (Signed)
 Pt was provided lunch

## 2023-08-14 NOTE — Progress Notes (Signed)
Pt was visible in the milieu this shift. No distress noted or concerns voiced. Staff will monitor for pt's safety.

## 2023-08-14 NOTE — ED Notes (Signed)
Patient observed/assessed at bedside lying in bed asleep. Patient alert and oriented to self and location. Affect is flat and patient seems agitated to be disturbed from sleeping. Patient denies pain and anxiety. He denies A/V/H. He denies having any thoughts/plan of self harm and harm towards others. . Patient states that appetite has been good throughout the day. Verbalizes no further complaints at this time. Will continue to monitor and support.

## 2023-08-14 NOTE — Group Note (Signed)
Group Topic: Positive Affirmations  Group Date: 08/14/2023 Start Time: 2000 End Time: 2045 Facilitators: Guss Bunde  Department: [facility]  Number of Participants: 6  Group Focus: affirmation Treatment Modality:  Leisure Development Interventions utilized were group exercise Purpose: relapse prevention strategies  Name: [name] [name]. Date of Birth: March 30, 1961  MR: [id number]    Level of Participation: active Quality of Participation: engaged Interactions with others: gave feedback Mood/Affect: appropriate Triggers (if applicable):  Cognition: goal directed Progress: Gaining insight Response:  Plan: patient will be encouraged to complete goals  Patients Problems:  Patient Active Problem List   Diagnosis Date Noted   Alcohol withdrawal (HCC) 08/12/2023   Polysubstance abuse (HCC) 08/11/2023   Substance induced mood disorder (HCC) 01/19/2021   Alcohol use disorder, severe, dependence (HCC) 01/05/2021   Cocaine use disorder, moderate, dependence (HCC) 01/05/2021   Depression 01/05/2021   Type 2 diabetes mellitus with diabetic neuropathy, without long-term current use of insulin (HCC) 09/21/2020   Septic arthritis (HCC) 08/09/2019   S/P right knee arthroscopy 07/29/2019 08/03/2019   Acute lateral meniscus tear of right knee    Acute medial meniscus tear of right knee    Substance use disorder 03/16/2019   History of diabetes mellitus 10/10/2016   Avascular necrosis of hip, left (HCC) 10/04/2016   Status post left hip replacement 10/04/2016   Rash and nonspecific skin eruption 08/12/2013   Cholelithiases 08/04/2013   Subcutaneous nodule 07/21/2013   Acute alcoholic pancreatitis 04/24/2013   Syncope 04/11/2013   Prolonged Q-T interval on ECG 04/11/2013   Cocaine abuse (HCC) 03/13/2013    Class: Chronic   At risk for adverse drug event 02/14/2013   DDD (degenerative disc disease), lumbar 02/01/2013   Dyspepsia 12/01/2012   BPH (benign  prostatic hyperplasia) 10/07/2012   Allergic rhinitis 10/03/2012   Transaminitis 10/02/2012   Chronic pain syndrome 10/02/2012   Essential hypertension, benign 07/01/2012   Tobacco user 07/01/2012   Hepatic steatosis 03/06/2012   ED (erectile dysfunction) 03/06/2012   Pseudocyst of pancreas 02/28/2012   Alcohol abuse 02/25/2012   GERD (gastroesophageal reflux disease) 02/23/2012   Bipolar 1 disorder (HCC) 02/23/2012   Hypertriglyceridemia 12/05/2011

## 2023-08-15 DIAGNOSIS — F1090 Alcohol use, unspecified, uncomplicated: Secondary | ICD-10-CM | POA: Diagnosis not present

## 2023-08-15 DIAGNOSIS — F1994 Other psychoactive substance use, unspecified with psychoactive substance-induced mood disorder: Secondary | ICD-10-CM | POA: Diagnosis not present

## 2023-08-15 NOTE — ED Notes (Addendum)
Patient A&Ox4. Denies intent to harm self/others when asked. Denies A/VH. Patient denies any physical complaints when asked. No acute distress noted. Pt continuously on phone calling facilities for residential tx that will take his insurance. Support and encouragement provided. Routine safety checks conducted according to facility protocol. Encouraged patient to notify staff if thoughts of harm toward self or others arise. Patient verbalize understanding and agreement. Will continue to monitor for safety.

## 2023-08-15 NOTE — ED Notes (Signed)
Pt just received a phone callback from [facility] representative, completing interview, per pt, pt can arrive Monday, Aug. 26th and that contact will be made with SW with arrangements. Secure chat will be made to MD and SW for verification.

## 2023-08-15 NOTE — ED Notes (Signed)
Patient is sleeping. Respirations equal and unlabored, skin warm and dry. No change in assessment or acuity. Routine safety checks conducted according to facility protocol. Will continue to monitor for safety.   

## 2023-08-15 NOTE — Group Note (Signed)
Group Topic: Wellness  Group Date: 08/15/2023 Start Time: 1000 End Time: 1030 Facilitators: Priscille Kluver, NT  Department: [facility]  Number of Participants: 4  Group Focus: daily focus Treatment Modality:  Skills Training Interventions utilized were support Purpose: enhance coping skills  Name: [name] [name]. Date of Birth: June 05, 1961  MR: [id number]    Level of Participation: minimal Quality of Participation: attentive Interactions with others: gave feedback Mood/Affect: appropriate  Cognition: coherent/clear Progress: Moderate  Plan:Have realistic goals Patients Problems:  Patient Active Problem List   Diagnosis Date Noted   Alcohol withdrawal (HCC) 08/12/2023   Polysubstance abuse (HCC) 08/11/2023   Substance induced mood disorder (HCC) 01/19/2021   Alcohol use disorder, severe, dependence (HCC) 01/05/2021   Cocaine use disorder, moderate, dependence (HCC) 01/05/2021   Depression 01/05/2021   Type 2 diabetes mellitus with diabetic neuropathy, without long-term current use of insulin (HCC) 09/21/2020   Septic arthritis (HCC) 08/09/2019   S/P right knee arthroscopy 07/29/2019 08/03/2019   Acute lateral meniscus tear of right knee    Acute medial meniscus tear of right knee    Substance use disorder 03/16/2019   History of diabetes mellitus 10/10/2016   Avascular necrosis of hip, left (HCC) 10/04/2016   Status post left hip replacement 10/04/2016   Rash and nonspecific skin eruption 08/12/2013   Cholelithiases 08/04/2013   Subcutaneous nodule 07/21/2013   Acute alcoholic pancreatitis 04/24/2013   Syncope 04/11/2013   Prolonged Q-T interval on ECG 04/11/2013   Cocaine abuse (HCC) 03/13/2013    Class: Chronic   At risk for adverse drug event 02/14/2013   DDD (degenerative disc disease), lumbar 02/01/2013   Dyspepsia 12/01/2012   BPH (benign prostatic hyperplasia) 10/07/2012   Allergic rhinitis 10/03/2012   Transaminitis  10/02/2012   Chronic pain syndrome 10/02/2012   Essential hypertension, benign 07/01/2012   Tobacco user 07/01/2012   Hepatic steatosis 03/06/2012   ED (erectile dysfunction) 03/06/2012   Pseudocyst of pancreas 02/28/2012   Alcohol abuse 02/25/2012   GERD (gastroesophageal reflux disease) 02/23/2012   Bipolar 1 disorder (HCC) 02/23/2012   Hypertriglyceridemia 12/05/2011

## 2023-08-15 NOTE — ED Notes (Signed)
Pt a/o , in dayroom with peers. He is pleasant and engaged. Denies SI/HI/AVH. Denies s/s of withdrawal. No noted distress. Will continue to monitor for safety

## 2023-08-15 NOTE — ED Notes (Signed)
Pt requested for pain medication for headache.

## 2023-08-15 NOTE — Discharge Instructions (Addendum)
Dear [name] [name].,  It was a pleasure to take care of you during your stay at [facility] where you were treated for your Polysubstance abuse [facility]).  While you were here, you were:  observed and cared for by our nurses and nursing assistants  treated with medications by your psychiatrists  evaluated with imaging / lab tests, and treated with medicines / procedures by your doctors  provided individual and group therapy by therapists  provided resources by our social workers and case managers  Please review the medication list provided to you at discharge and stop, start taking, or continue taking the medications listed there.  You should also follow-up with your primary care doctor, or start seeing one if you don't have one yet. If applicable, here are some scheduled follow-ups for you:    I recommend abstinence from alcohol, tobacco, and other illicit drug use.   If your psychiatric symptoms or suicidal thoughts recur, worsen, or if you have side effects to your psychiatric medications, call your outpatient psychiatric provider, 911, [phone] or go to the nearest emergency department.  Take care!  Signed: Augusto Gamble, MD 08/18/2023, 8:32 AM  Naloxone (Narcan) can help reverse an overdose when given to the victim quickly.  [place] offers free naloxone kits and instructions/training on its use.  Add naloxone to your first aid kit and you can help save a life. A prescription can be filled at your local pharmacy or free kits are provided by the county.  Pick up your free kit at the following locations:   [place]:  [facility], [address] [place] [address] [phone]) [facility] [address] [place] [address] [phone]) [facility] [address] [place] [address]  [place]: [facility] [address]  [place] [address] ([phone]) [facility] [address] [place] [address] [phone])

## 2023-08-15 NOTE — Group Note (Unsigned)
Group Topic: Relapse and Recovery  Group Date: 08/15/2023 Start Time: 2000 End Time: 2100 Facilitators: Hilma Favors, RN  Department: [facility]  Number of Participants: 5  Group Focus: relapse prevention Treatment Modality:  Behavior Modification Therapy Interventions utilized were support Purpose: regain self-worth  Name: [name] [name]. Date of Birth: [birth year]  MR: [id number]    Level of Participation: moderate Quality of Participation: attentive Interactions with others: gave feedback Mood/Affect: appropriate Triggers (if applicable):  Cognition: concrete Progress: Moderate Response:  Plan: referral / recommendations  Patients Problems:  Patient Active Problem List   Diagnosis Date Noted   Alcohol withdrawal (HCC) 08/12/2023   Polysubstance abuse (HCC) 08/11/2023   Substance induced mood disorder (HCC) 01/19/2021   Alcohol use disorder, severe, dependence (HCC) 01/05/2021   Cocaine use disorder, moderate, dependence (HCC) 01/05/2021   Depression 01/05/2021   Type 2 diabetes mellitus with diabetic neuropathy, without long-term current use of insulin (HCC) 09/21/2020   Septic arthritis (HCC) 08/09/2019   S/P right knee arthroscopy 07/29/2019 08/03/2019   Acute lateral meniscus tear of right knee    Acute medial meniscus tear of right knee    Substance use disorder 03/16/2019   History of diabetes mellitus 10/10/2016   Avascular necrosis of hip, left (HCC) 10/04/2016   Status post left hip replacement 10/04/2016   Rash and nonspecific skin eruption 08/12/2013   Cholelithiases 08/04/2013   Subcutaneous nodule 07/21/2013   Acute alcoholic pancreatitis 04/24/2013   Syncope 04/11/2013   Prolonged Q-T interval on ECG 04/11/2013   Cocaine abuse (HCC) 03/13/2013    Class: Chronic   At risk for adverse drug event 02/14/2013   DDD (degenerative disc disease), lumbar 02/01/2013   Dyspepsia 12/01/2012   BPH (benign prostatic hyperplasia)  10/07/2012   Allergic rhinitis 10/03/2012   Transaminitis 10/02/2012   Chronic pain syndrome 10/02/2012   Essential hypertension, benign 07/01/2012   Tobacco user 07/01/2012   Hepatic steatosis 03/06/2012   ED (erectile dysfunction) 03/06/2012   Pseudocyst of pancreas 02/28/2012   Alcohol abuse 02/25/2012   GERD (gastroesophageal reflux disease) 02/23/2012   Bipolar 1 disorder (HCC) 02/23/2012   Hypertriglyceridemia 12/05/2011

## 2023-08-15 NOTE — ED Provider Notes (Signed)
Behavioral Health Progress Note  Date and Time: 08/15/2023 9:50 AM Name: [name] [name]. MRN:  [id number]  Subjective: Patient says he is still waiting to hear back from the facilities he has been referred to. He says the most difficult barrier to overcome placement is due to his [employer] which many places don't accept. He denies experiencing any cravings for any substances at this time. He denies experiencing any withdrawal symptoms at this time.   Patient does not endorse any side-effects they attribute to medications. Patient does not endorse any somatic complaints  Diagnosis:  Final diagnoses:  Alcohol withdrawal syndrome without complication (HCC)   Total Time spent with patient: 45 minutes   Previous psychiatric diagnoses: Bipolar disorder, depression, polysubstance abuse   Psychotropic medication trials: Celexa 20 mg, Zyprexa 20 mg, Remeron 15 mg  Current psychiatric medications: Patient reports that he stopped medications about 1 week prior to admission    Past Medical History: Hypertriglyceridemia, GERD, HTN, chronic pain syndrome, BPH, degenerative disc disease, type 2 diabetes mellitus Allergies: Bee venom and Penicillins Family History: not discussed at this time  Social History: Homeless and unemployed (disability since 2013 for back pain). Single with 1 child. Previously lived in [place], [place]. Family in [place].  Substance Use History:  Tobacco: uses 1 pack of cigarettes over a 3 day period  Alcohol: daily (1 pint of liquor and 12 pack of beer) Marijuana: daily  Cocaine: 0.5 g daily  Opioids: Heroin (occasional, 2 times in the past month) Rehab: [facility], [facility]  Additional Social History:    Sleep: Poor  Appetite: Good  Current Medications: Current Facility-Administered Medications  Medication Dose Route Frequency Provider Last Rate Last Admin   acetaminophen (TYLENOL) tablet 650 mg  650 mg Oral Q6H PRN Augusto Gamble,  MD   650 mg at 08/15/23 0448   alum & mag hydroxide-simeth (MAALOX/MYLANTA) 200-200-20 MG/5ML suspension 30 mL  30 mL Oral Q4H PRN Augusto Gamble, MD       amLODipine (NORVASC) tablet 5 mg  5 mg Oral Daily Rankin, Shuvon B, NP   5 mg at 08/14/23 0847   bismuth subsalicylate (PEPTO BISMOL) chewable tablet 524 mg  524 mg Oral Q3H PRN Augusto Gamble, MD   524 mg at 08/12/23 1601   citalopram (CELEXA) tablet 20 mg  20 mg Oral Daily Rankin, Shuvon B, NP   20 mg at 08/14/23 0846   doxepin (SINEQUAN) capsule 10 mg  10 mg Oral BID Rankin, Shuvon B, NP   10 mg at 08/14/23 2115   DULoxetine (CYMBALTA) DR capsule 60 mg  60 mg Oral Daily Rankin, Shuvon B, NP   60 mg at 08/14/23 0847   fluticasone (FLONASE) 50 MCG/ACT nasal spray 1 spray  1 spray Each Nare Daily PRN Rankin, Shuvon B, NP   1 spray at 08/13/23 0828   gabapentin (NEURONTIN) capsule 600 mg  600 mg Oral TID Rankin, Shuvon B, NP   600 mg at 08/14/23 2115   gemfibrozil (LOPID) tablet 600 mg  600 mg Oral BID AC Rankin, Shuvon B, NP   600 mg at 08/14/23 1714   glipiZIDE (GLUCOTROL) tablet 5 mg  5 mg Oral Daily Rankin, Shuvon B, NP   5 mg at 08/14/23 0847   hydrOXYzine (ATARAX) tablet 25 mg  25 mg Oral TID PRN Augusto Gamble, MD       lidocaine (LIDODERM) 5 % 1 patch  1 patch Transdermal Q24H Augusto Gamble, MD   1 patch at  08/14/23 1306   lisinopril (ZESTRIL) tablet 10 mg  10 mg Oral Daily Rankin, Shuvon B, NP   10 mg at 08/14/23 0846   meloxicam (MOBIC) tablet 15 mg  15 mg Oral Daily Rankin, Shuvon B, NP   15 mg at 08/14/23 0847   metFORMIN (GLUCOPHAGE) tablet 500 mg  500 mg Oral BID WC Rankin, Shuvon B, NP   500 mg at 08/14/23 1714   methocarbamol (ROBAXIN) tablet 750 mg  750 mg Oral TID Rankin, Shuvon B, NP   750 mg at 08/14/23 2115   mirtazapine (REMERON) tablet 15 mg  15 mg Oral QHS Rankin, Shuvon B, NP   15 mg at 08/14/23 2115   thiamine (VITAMIN B1) tablet 100 mg  100 mg Oral Daily Augusto Gamble, MD   100 mg at 08/14/23 6962   And   multivitamin with  minerals tablet 1 tablet  1 tablet Oral Daily Augusto Gamble, MD   1 tablet at 08/14/23 0847   naproxen (NAPROSYN) tablet 500 mg  500 mg Oral BID PRN Augusto Gamble, MD   500 mg at 08/14/23 2117   pantoprazole (PROTONIX) EC tablet 40 mg  40 mg Oral Daily Rankin, Shuvon B, NP   40 mg at 08/14/23 0846   polyethylene glycol (MIRALAX / GLYCOLAX) packet 17 g  17 g Oral Daily PRN Augusto Gamble, MD       senna (SENOKOT) tablet 8.6 mg  1 tablet Oral QHS PRN Augusto Gamble, MD       tamsulosin [facility]) capsule 0.4 mg  0.4 mg Oral Daily Rankin, Shuvon B, NP   0.4 mg at 08/14/23 9528   Current Outpatient Medications  Medication Sig Dispense Refill   amLODipine (NORVASC) 5 MG tablet Take 1 tablet (5 mg total) by mouth daily. 30 tablet 0   citalopram (CELEXA) 20 MG tablet Take 1 tablet (20 mg total) by mouth daily. 30 tablet 0   doxepin (SINEQUAN) 10 MG capsule Take 10 mg by mouth 2 (two) times daily.     DULoxetine (CYMBALTA) 60 MG capsule Take 60 mg by mouth daily.     fluticasone (FLONASE) 50 MCG/ACT nasal spray Place 1 spray into both nostrils daily as needed for allergies or rhinitis.     gabapentin (NEURONTIN) 300 MG capsule Take 600 mg by mouth 3 (three) times daily.     gemfibrozil (LOPID) 600 MG tablet Take 600 mg by mouth 2 (two) times daily before a meal.     glipiZIDE (GLUCOTROL) 5 MG tablet Take 1 tablet (5 mg total) by mouth daily. 30 tablet 0   insulin glargine (LANTUS) 100 UNIT/ML injection Inject 0.2 mLs (20 Units total) into the skin daily. (Patient taking differently: Inject 2-8 Units into the skin daily as needed. Sliding scale 2-8) 10 mL 11   lisinopril (ZESTRIL) 10 MG tablet Take 10 mg by mouth daily.     loratadine (CLARITIN) 10 MG tablet Take 1 tablet (10 mg total) by mouth daily. (Patient taking differently: Take 10 mg by mouth daily as needed for allergies.) 30 tablet 0   meloxicam (MOBIC) 15 MG tablet Take 15 mg by mouth daily.     metFORMIN (GLUCOPHAGE) 500 MG tablet Take 2 tablets (1,000 mg  total) by mouth 2 (two) times daily with a meal. (Patient taking differently: Take 500 mg by mouth 2 (two) times daily with a meal.) 60 tablet 0   methocarbamol (ROBAXIN) 750 MG tablet Take 750 mg by mouth 3 (three) times daily.  mirtazapine (REMERON) 15 MG tablet Take 15 mg by mouth at bedtime.     OLANZapine (ZYPREXA) 20 MG tablet Take 20 mg by mouth in the morning and at bedtime.     omeprazole (PRILOSEC) 40 MG capsule Take 40 mg by mouth daily.     tamsulosin (FLOMAX) 0.4 MG CAPS capsule Take 1 capsule (0.4 mg total) by mouth daily. 30 capsule 0    Labs  Lab Results:     Latest Ref Rng & Units 08/11/2023    4:04 PM 07/07/2023   12:50 PM 05/18/2021   12:08 AM  CBC  WBC 4.0 - 10.5 K/uL 6.3  6.4  5.9   Hemoglobin 13.0 - 17.0 g/dL 16.1  09.6  04.5   Hematocrit 39.0 - 52.0 % 47.0  45.3  38.3   Platelets 150 - 400 K/uL 302  446  235       Latest Ref Rng & Units 08/11/2023    4:04 PM 07/07/2023   12:50 PM 05/18/2021   12:08 AM  CMP  Glucose 70 - 99 mg/dL 409  85  811   BUN 6 - 20 mg/dL 20  12  20    Creatinine 0.61 - 1.24 mg/dL 9.14  7.82  9.56   Sodium 135 - 145 mmol/L 139  140  140   Potassium 3.5 - 5.1 mmol/L 4.5  3.7  3.7   Chloride 98 - 111 mmol/L 97  105  107   CO2 22 - 32 mmol/L 27  26  24    Calcium 8.9 - 10.3 mg/dL 9.7  9.1  8.7   Total Protein 6.5 - 8.1 g/dL 7.1  7.2  6.0   Total Bilirubin 0.3 - 1.2 mg/dL 0.3  0.7  0.5   Alkaline Phos 38 - 126 U/L 41  30  29   AST 15 - 41 U/L 34  26  21   ALT 0 - 44 U/L 17  20  22      Blood Alcohol level:  Lab Results  Component Value Date   ETH <10 08/11/2023   ETH <10 07/07/2023   Metabolic Disorder Labs: Lab Results  Component Value Date   HGBA1C 6.9 (H) 08/11/2023   MPG 151.33 08/11/2023   MPG 114 07/09/2023   Lab Results  Component Value Date   PROLACTIN 6.8 08/11/2023   Lab Results  Component Value Date   CHOL 244 (H) 08/11/2023   TRIG 528 (H) 08/11/2023   HDL 79 08/11/2023   CHOLHDL 3.1 08/11/2023   VLDL  UNABLE TO CALCULATE IF TRIGLYCERIDE OVER 400 mg/dL 21/30/8657   LDLCALC UNABLE TO CALCULATE IF TRIGLYCERIDE OVER 400 mg/dL 84/69/6295   LDLCALC 90 06/28/2013   Therapeutic Lab Levels: No results found for: "LITHIUM" No results found for: "VALPROATE" No results found for: "CBMZ" Physical Findings   AUDIT    Flowsheet Row Ad[facility] (Discharged) [facility]  Alcohol Use Disorder Identification Test Final Score (AUDIT) 27      PHQ2-9    Flowsheet Row ED from 08/11/2023 in [facility] ED from 12/31/2020 in [facility]  PHQ-2 Total Score 1 2  PHQ-9 Total Score -- 16      Flowsheet Row ED from 08/11/2023 in [facility] ED from 07/09/2023 in [facility] ED from 07/08/2023 in [facility] Emergency Department at [facility]  C-SSRS RISK CATEGORY No Risk  No Risk No Risk       Musculoskeletal  Strength & Muscle Tone: within normal limits Gait & Station: normal Patient leans: N/A  Psychiatric Specialty Exam  Presentation  General Appearance: Appropriate for Environment; Well Groomed   Eye Contact:Good   Speech:Clear and Coherent; Normal Rate   Speech Volume:Normal   Handedness:Right   Mood and Affect  Mood:-- ("doing fine")   Affect:Appropriate; Congruent; Restricted   Thought Process  Thought Processes:Coherent; Linear; Goal Directed   Descriptions of Associations:Intact   Orientation:Full (Time, Place and Person)   Thought Content:Logical; WDL  Diagnosis of Schizophrenia or Schizoaffective disorder in past: No data recorded   Hallucinations:Hallucinations: None   Ideas of Reference:None   Suicidal Thoughts:Suicidal Thoughts: No   Homicidal Thoughts:Homicidal Thoughts: No   Sensorium  Memory:Immediate Good; Recent Good; Remote  Good   Judgment:Good   Insight:Good   Executive Functions  Concentration:Good   Attention Span:Good   Recall:Good   Fund of Knowledge:Good   Language:Good   Psychomotor Activity  Psychomotor Activity:Psychomotor Activity: Normal   Assets  Assets:Resilience   Sleep  Sleep:Sleep: Poor   No data recorded  Physical Exam  Physical Exam Vitals and nursing note reviewed.  HENT:     Head: Normocephalic and atraumatic.  Pulmonary:     Effort: Pulmonary effort is normal.  Musculoskeletal:     Cervical back: Normal range of motion.  Neurological:     General: No focal deficit present.     Mental Status: He is alert. Mental status is at baseline.    Review of Systems  Constitutional: Negative.   Respiratory: Negative.    Cardiovascular: Negative.   Gastrointestinal: Negative.   Genitourinary: Negative.   Psychiatric/Behavioral:         Psychiatric subjective data addressed in PSE or HPI / daily subjective report   Blood pressure (!) 135/93, pulse 74, temperature 97.9 F (36.6 C), temperature source Oral, resp. rate 18, SpO2 97%. There is no height or weight on file to calculate BMI.  Treatment Plan Summary: Daily contact with patient to assess and evaluate symptoms and progress in treatment and Medication management: -- continue home citalopram 20 mg daily for depressive symptoms -- continue home duloxetine 60 mg daily for depressive symptoms and neuropathic pain -- continue home gabapentin 600 mg TID for neuropathic pain and alcohol cravings -- continue home mirtazapine 15 mg at bedtime for depressive symptoms and sleep              -- medical regimen: amlodipine 5 mg daily and lisinopril 10 mg daily for HTN, metformin 500 mg BID and glipizide 5 mg daily for diabetes, gemfibrozil 600 mg BID for hypertriglyceridemia, pantoprazole 40 mg daily for GERD, tamsulosin 0.4 mg daily for BPH, and doxepin 10 mg BID for sleep initiation/maintenance, meloxicam 15 mg  daily and methocarbamol 750 TID for pain -- Patient does not need nicotine replacement  PRNs              -- continue acetaminophen 650 mg every 6 hours as needed for mild to moderate pain, fever, and headaches              -- continue hydroxyzine 25 mg three times a day as needed for anxiety              -- continue bismuth subsalicylate 524 mg oral chewable tablet every 3 hours as needed for diarrhea / loose stools              --  continue senna 8.6 mg oral at bedtime and polyethylene glycol 17 g oral daily as needed for mild to moderate constipation              -- continue ondansetron 8 mg every 8 hours as needed for nausea or vomiting              -- continue aluminum-magnesium hydroxide + simethicone 30 mL every 4 hours as needed for heartburn or indigestion -- continue naproxen 500 mg BID PRN mild-moderate pain   -- As needed agitation protocol in-place  Augusto Gamble, MD 08/15/23 9:50 AM

## 2023-08-16 DIAGNOSIS — F1090 Alcohol use, unspecified, uncomplicated: Secondary | ICD-10-CM | POA: Diagnosis present

## 2023-08-16 DIAGNOSIS — F1994 Other psychoactive substance use, unspecified with psychoactive substance-induced mood disorder: Secondary | ICD-10-CM | POA: Diagnosis not present

## 2023-08-16 MED ORDER — DOXEPIN HCL 10 MG PO CAPS
10.0000 mg | ORAL_CAPSULE | Freq: Every day | ORAL | Status: DC
  Administered 2023-08-16 – 2023-08-17 (×2): 10 mg via ORAL
  Filled 2023-08-16 (×2): qty 1

## 2023-08-16 MED ORDER — METHOCARBAMOL 500 MG PO TABS
500.0000 mg | ORAL_TABLET | Freq: Two times a day (BID) | ORAL | Status: DC | PRN
  Administered 2023-08-17: 500 mg via ORAL
  Filled 2023-08-16: qty 1

## 2023-08-16 NOTE — ED Notes (Signed)
Patient is sleeping. Respirations equal and unlabored, skin warm and dry. No change in assessment or acuity. Routine safety checks conducted according to facility protocol. Will continue to monitor for safety.   

## 2023-08-16 NOTE — Group Note (Signed)
Group Topic: Recovery Basics  Group Date: 08/16/2023 Start Time: 1600 End Time: 1620 Facilitators: Jenean Lindau, RN  Department: [facility]  Number of Participants: 3  Group Focus: chemical dependency education Treatment Modality:  Behavior Modification Therapy Interventions utilized were patient education Purpose: increase insight  Name: [name] [name]. Date of Birth: [birth year]  MR: [id number]    Level of Participation: active Quality of Participation: attentive Interactions with others: gave feedback Mood/Affect: appropriate Triggers (if applicable):   Cognition: coherent/clear Progress: Gaining insight Response:   Plan: follow-up needed  Patients Problems:  Patient Active Problem List   Diagnosis Date Noted   Alcohol use disorder 08/16/2023   Alcohol withdrawal (HCC) 08/12/2023   Polysubstance abuse (HCC) 08/11/2023   Substance induced mood disorder (HCC) 01/19/2021   Alcohol use disorder, severe, dependence (HCC) 01/05/2021   Cocaine use disorder, moderate, dependence (HCC) 01/05/2021   Depression 01/05/2021   Type 2 diabetes mellitus with diabetic neuropathy, without long-term current use of insulin (HCC) 09/21/2020   Septic arthritis (HCC) 08/09/2019   S/P right knee arthroscopy 07/29/2019 08/03/2019   Acute lateral meniscus tear of right knee    Acute medial meniscus tear of right knee    Substance use disorder 03/16/2019   History of diabetes mellitus 10/10/2016   Avascular necrosis of hip, left (HCC) 10/04/2016   Status post left hip replacement 10/04/2016   Rash and nonspecific skin eruption 08/12/2013   Cholelithiases 08/04/2013   Subcutaneous nodule 07/21/2013   Acute alcoholic pancreatitis 04/24/2013   Syncope 04/11/2013   Prolonged Q-T interval on ECG 04/11/2013   Cocaine abuse (HCC) 03/13/2013    Class: Chronic   At risk for adverse drug event 02/14/2013   DDD (degenerative disc disease), lumbar 02/01/2013    Dyspepsia 12/01/2012   BPH (benign prostatic hyperplasia) 10/07/2012   Allergic rhinitis 10/03/2012   Transaminitis 10/02/2012   Chronic pain syndrome 10/02/2012   Essential hypertension, benign 07/01/2012   Tobacco user 07/01/2012   Hepatic steatosis 03/06/2012   ED (erectile dysfunction) 03/06/2012   Pseudocyst of pancreas 02/28/2012   Alcohol abuse 02/25/2012   GERD (gastroesophageal reflux disease) 02/23/2012   Bipolar 1 disorder (HCC) 02/23/2012   Hypertriglyceridemia 12/05/2011

## 2023-08-16 NOTE — ED Provider Notes (Signed)
Behavioral Health Progress Note  Date and Time: 08/16/2023 11:30 AM Name: [name] [name]. MRN:  [id number]  Subjective:   The patient "[name] [name]" is a 62 year old male with a history of alcohol use disorder, and opioid use disorder.  The patient has a history of homelessness and receives disability income.  His most recent psychiatric admission appears to have been in 2014 for cocaine abuse.  The patient presented to the [facility] behavioral health urgent care requesting substance use treatment and residential rehab placement.  The patient states that [facility] [place].  Weekday team will verify.  Presently the patient denies diarrhea or tremulousness.  He is in no acute distress.  We discussed that we will need to discontinue his Celexa and decrease his doxepin to nightly.  He expresses understanding and agreement.  The patient denies auditory/visual hallucinations.  The patient reports good mood, appetite, and sleep. They deny suicidal and homicidal thoughts. The patient denies side effects from their medications.  Review of systems as below. The patient denies experiencing any withdrawal symptoms.    Diagnosis:  Final diagnoses:  Alcohol use disorder  Substance induced mood disorder (HCC)   Total Time spent with patient: 20 minutes  Past Psychiatric History: as above Past Medical History: as above Family History: none Family Psychiatric  History: none Social History: as above and per H and P  Additional Social History:  See H and P                  Sleep: Fair  Appetite:  Fair   Current Medications:  Current Facility-Administered Medications  Medication Dose Route Frequency Provider Last Rate Last Admin   acetaminophen (TYLENOL) tablet 650 mg  650 mg Oral Q6H PRN Augusto Gamble, MD   650 mg at 08/15/23 0448   alum & mag hydroxide-simeth (MAALOX/MYLANTA) 200-200-20 MG/5ML suspension 30 mL  30 mL Oral Q4H PRN Augusto Gamble, MD       amLODipine  (NORVASC) tablet 5 mg  5 mg Oral Daily Rankin, Shuvon B, NP   5 mg at 08/16/23 0944   bismuth subsalicylate (PEPTO BISMOL) chewable tablet 524 mg  524 mg Oral Q3H PRN Augusto Gamble, MD   524 mg at 08/12/23 1601   citalopram (CELEXA) tablet 20 mg  20 mg Oral Daily Rankin, Shuvon B, NP   20 mg at 08/16/23 0944   doxepin (SINEQUAN) capsule 10 mg  10 mg Oral BID Rankin, Shuvon B, NP   10 mg at 08/16/23 0945   DULoxetine (CYMBALTA) DR capsule 60 mg  60 mg Oral Daily Rankin, Shuvon B, NP   60 mg at 08/16/23 0944   fluticasone (FLONASE) 50 MCG/ACT nasal spray 1 spray  1 spray Each Nare Daily PRN Rankin, Shuvon B, NP   1 spray at 08/13/23 0828   gabapentin (NEURONTIN) capsule 600 mg  600 mg Oral TID Rankin, Shuvon B, NP   600 mg at 08/16/23 0944   gemfibrozil (LOPID) tablet 600 mg  600 mg Oral BID AC Rankin, Shuvon B, NP   600 mg at 08/16/23 0816   glipiZIDE (GLUCOTROL) tablet 5 mg  5 mg Oral Daily Rankin, Shuvon B, NP   5 mg at 08/16/23 0945   hydrOXYzine (ATARAX) tablet 25 mg  25 mg Oral TID PRN Augusto Gamble, MD       lidocaine (LIDODERM) 5 % 1 patch  1 patch Transdermal Q24H Augusto Gamble, MD   1 patch at 08/16/23 [phone]  lisinopril (ZESTRIL) tablet 10 mg  10 mg Oral Daily Rankin, Shuvon B, NP   10 mg at 08/16/23 0945   meloxicam (MOBIC) tablet 15 mg  15 mg Oral Daily Rankin, Shuvon B, NP   15 mg at 08/16/23 0944   metFORMIN (GLUCOPHAGE) tablet 500 mg  500 mg Oral BID WC Rankin, Shuvon B, NP   500 mg at 08/16/23 0816   methocarbamol (ROBAXIN) tablet 750 mg  750 mg Oral TID Rankin, Shuvon B, NP   750 mg at 08/16/23 0945   mirtazapine (REMERON) tablet 15 mg  15 mg Oral QHS Rankin, Shuvon B, NP   15 mg at 08/15/23 2116   thiamine (VITAMIN B1) tablet 100 mg  100 mg Oral Daily Augusto Gamble, MD   100 mg at 08/16/23 0945   And   multivitamin with minerals tablet 1 tablet  1 tablet Oral Daily Augusto Gamble, MD   1 tablet at 08/16/23 0945   naproxen (NAPROSYN) tablet 500 mg  500 mg Oral BID PRN Augusto Gamble, MD   500 mg  at 08/15/23 2116   pantoprazole (PROTONIX) EC tablet 40 mg  40 mg Oral Daily Rankin, Shuvon B, NP   40 mg at 08/16/23 0945   polyethylene glycol (MIRALAX / GLYCOLAX) packet 17 g  17 g Oral Daily PRN Augusto Gamble, MD       senna (SENOKOT) tablet 8.6 mg  1 tablet Oral QHS PRN Augusto Gamble, MD       tamsulosin [facility]) capsule 0.4 mg  0.4 mg Oral Daily Rankin, Shuvon B, NP   0.4 mg at 08/16/23 0945   Current Outpatient Medications  Medication Sig Dispense Refill   amLODipine (NORVASC) 5 MG tablet Take 1 tablet (5 mg total) by mouth daily. 30 tablet 0   citalopram (CELEXA) 20 MG tablet Take 1 tablet (20 mg total) by mouth daily. 30 tablet 0   doxepin (SINEQUAN) 10 MG capsule Take 10 mg by mouth 2 (two) times daily.     DULoxetine (CYMBALTA) 60 MG capsule Take 60 mg by mouth daily.     fluticasone (FLONASE) 50 MCG/ACT nasal spray Place 1 spray into both nostrils daily as needed for allergies or rhinitis.     gabapentin (NEURONTIN) 300 MG capsule Take 600 mg by mouth 3 (three) times daily.     gemfibrozil (LOPID) 600 MG tablet Take 600 mg by mouth 2 (two) times daily before a meal.     glipiZIDE (GLUCOTROL) 5 MG tablet Take 1 tablet (5 mg total) by mouth daily. 30 tablet 0   insulin glargine (LANTUS) 100 UNIT/ML injection Inject 0.2 mLs (20 Units total) into the skin daily. (Patient taking differently: Inject 2-8 Units into the skin daily as needed. Sliding scale 2-8) 10 mL 11   lisinopril (ZESTRIL) 10 MG tablet Take 10 mg by mouth daily.     loratadine (CLARITIN) 10 MG tablet Take 1 tablet (10 mg total) by mouth daily. (Patient taking differently: Take 10 mg by mouth daily as needed for allergies.) 30 tablet 0   meloxicam (MOBIC) 15 MG tablet Take 15 mg by mouth daily.     metFORMIN (GLUCOPHAGE) 500 MG tablet Take 2 tablets (1,000 mg total) by mouth 2 (two) times daily with a meal. (Patient taking differently: Take 500 mg by mouth 2 (two) times daily with a meal.) 60 tablet 0   methocarbamol (ROBAXIN)  750 MG tablet Take 750 mg by mouth 3 (three) times daily.     mirtazapine (REMERON)  15 MG tablet Take 15 mg by mouth at bedtime.     OLANZapine (ZYPREXA) 20 MG tablet Take 20 mg by mouth in the morning and at bedtime.     omeprazole (PRILOSEC) 40 MG capsule Take 40 mg by mouth daily.     tamsulosin (FLOMAX) 0.4 MG CAPS capsule Take 1 capsule (0.4 mg total) by mouth daily. 30 capsule 0    Labs  Lab Results:  Admission on 08/11/2023  Component Date Value Ref Range Status   Glucose-Capillary 08/11/2023 179 (H)  70 - 99 mg/dL Final   Glucose reference range applies only to samples taken after fasting for at least 8 hours.   Glucose-Capillary 08/12/2023 203 (H)  70 - 99 mg/dL Final   Glucose reference range applies only to samples taken after fasting for at least 8 hours.   Glucose-Capillary 08/13/2023 157 (H)  70 - 99 mg/dL Final   Glucose reference range applies only to samples taken after fasting for at least 8 hours.  Admission on 08/11/2023, Discharged on 08/11/2023  Component Date Value Ref Range Status   WBC 08/11/2023 6.3  4.0 - 10.5 K/uL Final   RBC 08/11/2023 5.23  4.22 - 5.81 MIL/uL Final   Hemoglobin 08/11/2023 15.4  13.0 - 17.0 g/dL Final   HCT 16/09/9603 47.0  39.0 - 52.0 % Final   MCV 08/11/2023 89.9  80.0 - 100.0 fL Final   MCH 08/11/2023 29.4  26.0 - 34.0 pg Final   MCHC 08/11/2023 32.8  30.0 - 36.0 g/dL Final   RDW 54/08/8118 15.8 (H)  11.5 - 15.5 % Final   Platelets 08/11/2023 302  150 - 400 K/uL Final   nRBC 08/11/2023 0.0  0.0 - 0.2 % Final   Neutrophils Relative % 08/11/2023 55  % Final   Neutro Abs 08/11/2023 3.5  1.7 - 7.7 K/uL Final   Lymphocytes Relative 08/11/2023 31  % Final   Lymphs Abs 08/11/2023 2.0  0.7 - 4.0 K/uL Final   Monocytes Relative 08/11/2023 11  % Final   Monocytes Absolute 08/11/2023 0.7  0.1 - 1.0 K/uL Final   Eosinophils Relative 08/11/2023 2  % Final   Eosinophils Absolute 08/11/2023 0.1  0.0 - 0.5 K/uL Final   Basophils Relative  08/11/2023 1  % Final   Basophils Absolute 08/11/2023 0.0  0.0 - 0.1 K/uL Final   Immature Granulocytes 08/11/2023 0  % Final   Abs Immature Granulocytes 08/11/2023 0.01  0.00 - 0.07 K/uL Final   Performed at [facility] Lab, [address] [place], [place] [address]   Sodium 08/11/2023 139  135 - 145 mmol/L Final   Potassium 08/11/2023 4.5  3.5 - 5.1 mmol/L Final   Chloride 08/11/2023 97 (L)  98 - 111 mmol/L Final   CO2 08/11/2023 27  22 - 32 mmol/L Final   Glucose, Bld 08/11/2023 125 (H)  70 - 99 mg/dL Final   Glucose reference range applies only to samples taken after fasting for at least 8 hours.   BUN 08/11/2023 20  6 - 20 mg/dL Final   Creatinine, Ser 08/11/2023 1.13  0.61 - 1.24 mg/dL Final   Calcium 95/62/1308 9.7  8.9 - 10.3 mg/dL Final   Total Protein 65/78/4696 7.1  6.5 - 8.1 g/dL Final   Albumin 29/52/8413 4.0  3.5 - 5.0 g/dL Final   AST 24/40/1027 34  15 - 41 U/L Final   ALT 08/11/2023 17  0 - 44 U/L Final   Alkaline Phosphatase 08/11/2023  41  38 - 126 U/L Final   Total Bilirubin 08/11/2023 0.3  0.3 - 1.2 mg/dL Final   GFR, Estimated 08/11/2023 >60  >60 mL/min Final   Comment: (NOTE) Calculated using the CKD-EPI Creatinine Equation (2021)    Anion gap 08/11/2023 15  5 - 15 Final   Performed at [facility] Lab, [address], [place], [place] [address]   Hgb A1c MFr Bld 08/11/2023 6.9 (H)  4.8 - 5.6 % Final   Comment: (NOTE) Pre diabetes:          5.7%-6.4%  Diabetes:              >6.4%  Glycemic control for   <7.0% adults with diabetes    Mean Plasma Glucose 08/11/2023 151.33  mg/dL Final   Performed at [facility] Lab, [address], [place], [place] [address]   Magnesium 08/11/2023 2.2  1.7 - 2.4 mg/dL Final   Performed at [facility], [address] [place], [place] [address]   Alcohol, Ethyl (B) 08/11/2023 <10  <10 mg/dL Final   Comment: (NOTE) Lowest detectable limit for serum alcohol is 10 mg/dL.  For medical purposes only. Performed  at [facility] Lab, [address] [place], [place] [address]    Cholesterol 08/11/2023 244 (H)  0 - 200 mg/dL Final   Triglycerides 29/56/2130 528 (H)  <150 mg/dL Final   HDL 86/57/8469 79  >40 mg/dL Final   Total CHOL/HDL Ratio 08/11/2023 3.1  RATIO Final   VLDL 08/11/2023 UNABLE TO CALCULATE IF TRIGLYCERIDE OVER 400 mg/dL  0 - 40 mg/dL Final   LDL Cholesterol 08/11/2023 UNABLE TO CALCULATE IF TRIGLYCERIDE OVER 400 mg/dL  0 - 99 mg/dL Final   Comment:        Total Cholesterol/HDL:CHD Risk Coronary Heart Disease Risk Table                     Men   Women  1/2 Average Risk   3.4   3.3  Average Risk       5.0   4.4  2 X Average Risk   9.6   7.1  3 X Average Risk  23.4   11.0        Use the calculated Patient Ratio above and the CHD Risk Table to determine the patient's CHD Risk.        ATP III CLASSIFICATION (LDL):  <100     mg/dL   Optimal  629-528  mg/dL   Near or Above                    Optimal  130-159  mg/dL   Borderline  413-244  mg/dL   High  >010     mg/dL   Very High Performed at [facility] Lab, [address], [place], [place] [address]    TSH 08/11/2023 0.855  0.350 - 4.500 uIU/mL Final   Comment: Performed by a 3rd Generation assay with a functional sensitivity of <=0.01 uIU/mL. Performed at [facility], [address] [place], [place] [address]    Prolactin 08/11/2023 6.8  3.6 - 25.2 ng/mL Final   Comment: (NOTE) Performed At: [facility] [address] [place], [place] [id number] Jolene Schimke MD LO:[id number]    Color, Urine 08/11/2023 YELLOW  YELLOW Final   APPearance 08/11/2023 CLEAR  CLEAR Final   Specific Gravity, Urine 08/11/2023 1.025  1.005 - 1.030 Final  pH 08/11/2023 6.0  5.0 - 8.0 Final   Glucose, UA 08/11/2023 NEGATIVE  NEGATIVE mg/dL Final   Hgb urine dipstick 08/11/2023 NEGATIVE  NEGATIVE Final   Bilirubin Urine 08/11/2023 NEGATIVE  NEGATIVE Final   Ketones, ur 08/11/2023 20 (A)  NEGATIVE mg/dL Final   Protein, ur  40/98/1191 NEGATIVE  NEGATIVE mg/dL Final   Nitrite 47/82/9562 NEGATIVE  NEGATIVE Final   Leukocytes,Ua 08/11/2023 NEGATIVE  NEGATIVE Final   RBC / HPF 08/11/2023 0-5  0 - 5 RBC/hpf Final   WBC, UA 08/11/2023 0-5  0 - 5 WBC/hpf Final   Bacteria, UA 08/11/2023 RARE (A)  NONE SEEN Final   Squamous Epithelial / HPF 08/11/2023 0-5  0 - 5 /HPF Final   Mucus 08/11/2023 PRESENT   Final   Performed at [facility] Lab, [address] [place], [place] [address]   POC Amphetamine UR 08/11/2023 None Detected  NONE DETECTED (Cut Off Level 1000 ng/mL) Final   POC Secobarbital (BAR) 08/11/2023 None Detected  NONE DETECTED (Cut Off Level 300 ng/mL) Final   POC Buprenorphine (BUP) 08/11/2023 None Detected  NONE DETECTED (Cut Off Level 10 ng/mL) Final   POC Oxazepam (BZO) 08/11/2023 Positive (A)  NONE DETECTED (Cut Off Level 300 ng/mL) Final   POC Cocaine UR 08/11/2023 Positive (A)  NONE DETECTED (Cut Off Level 300 ng/mL) Final   POC Methamphetamine UR 08/11/2023 None Detected  NONE DETECTED (Cut Off Level 1000 ng/mL) Final   POC Morphine 08/11/2023 None Detected  NONE DETECTED (Cut Off Level 300 ng/mL) Final   POC Methadone UR 08/11/2023 None Detected  NONE DETECTED (Cut Off Level 300 ng/mL) Final   POC Oxycodone UR 08/11/2023 None Detected  NONE DETECTED (Cut Off Level 100 ng/mL) Final   POC Marijuana UR 08/11/2023 Positive (A)  NONE DETECTED (Cut Off Level 50 ng/mL) Final   Direct LDL 08/11/2023 101 (H)  0 - 99 mg/dL Final   Performed at [facility], [address] [place], [place] [address]  Admission on 07/09/2023, Discharged on 07/09/2023  Component Date Value Ref Range Status   Hgb A1c MFr Bld 07/09/2023 5.6  4.8 - 5.6 % Final   Comment: (NOTE)         Prediabetes: 5.7 - 6.4         Diabetes: >6.4         Glycemic control for adults with diabetes: <7.0    Mean Plasma Glucose 07/09/2023 114  mg/dL Final   Comment: (NOTE) Performed At: [facility] [address] [place],  [place] [id number] Jolene Schimke MD LK:[id number]   Admission on 07/08/2023, Discharged on 07/08/2023  Component Date Value Ref Range Status   Glucose-Capillary 07/08/2023 143 (H)  70 - 99 mg/dL Final   Glucose reference range applies only to samples taken after fasting for at least 8 hours.  Admission on 07/07/2023, Discharged on 07/07/2023  Component Date Value Ref Range Status   Sodium 07/07/2023 140  135 - 145 mmol/L Final   Potassium 07/07/2023 3.7  3.5 - 5.1 mmol/L Final   Chloride 07/07/2023 105  98 - 111 mmol/L Final   CO2 07/07/2023 26  22 - 32 mmol/L Final   Glucose, Bld 07/07/2023 85  70 - 99 mg/dL Final   Glucose reference range applies only to samples taken after fasting for at least 8 hours.   BUN 07/07/2023 12  6 - 20 mg/dL Final   Creatinine, Ser 07/07/2023 0.99  0.61 - 1.24 mg/dL Final  Calcium 07/07/2023 9.1  8.9 - 10.3 mg/dL Final   Total Protein 16/09/9603 7.2  6.5 - 8.1 g/dL Final   Albumin 54/08/8118 3.9  3.5 - 5.0 g/dL Final   AST 14/78/2956 26  15 - 41 U/L Final   ALT 07/07/2023 20  0 - 44 U/L Final   Alkaline Phosphatase 07/07/2023 30 (L)  38 - 126 U/L Final   Total Bilirubin 07/07/2023 0.7  0.3 - 1.2 mg/dL Final   GFR, Estimated 07/07/2023 >60  >60 mL/min Final   Comment: (NOTE) Calculated using the CKD-EPI Creatinine Equation (2021)    Anion gap 07/07/2023 9  5 - 15 Final   Performed at [facility], [address], [place], [place] [address]   Alcohol, Ethyl (B) 07/07/2023 <10  <10 mg/dL Final   Comment: (NOTE) Lowest detectable limit for serum alcohol is 10 mg/dL.  For medical purposes only. Performed at [facility], [address], [place], [place] [address]    WBC 07/07/2023 6.4  4.0 - 10.5 K/uL Final   RBC 07/07/2023 5.04  4.22 - 5.81 MIL/uL Final   Hemoglobin 07/07/2023 14.5  13.0 - 17.0 g/dL Final   HCT 69/62/9528 45.3  39.0 - 52.0 % Final   MCV 07/07/2023 89.9  80.0 - 100.0 fL Final   MCH 07/07/2023 28.8  26.0 - 34.0 pg Final    MCHC 07/07/2023 32.0  30.0 - 36.0 g/dL Final   RDW 41/32/4401 15.6 (H)  11.5 - 15.5 % Final   Platelets 07/07/2023 446 (H)  150 - 400 K/uL Final   nRBC 07/07/2023 0.0  0.0 - 0.2 % Final   Performed at [facility], [address], [place], [place] [address]   Opiates 07/07/2023 NONE DETECTED  NONE DETECTED Final   Cocaine 07/07/2023 POSITIVE (A)  NONE DETECTED Final   Benzodiazepines 07/07/2023 POSITIVE (A)  NONE DETECTED Final   Amphetamines 07/07/2023 NONE DETECTED  NONE DETECTED Final   Tetrahydrocannabinol 07/07/2023 NONE DETECTED  NONE DETECTED Final   Barbiturates 07/07/2023 NONE DETECTED  NONE DETECTED Final   Comment: (NOTE) DRUG SCREEN FOR MEDICAL PURPOSES ONLY.  IF CONFIRMATION IS NEEDED FOR ANY PURPOSE, NOTIFY LAB WITHIN 5 DAYS.  LOWEST DETECTABLE LIMITS FOR URINE DRUG SCREEN Drug Class                     Cutoff (ng/mL) Amphetamine and metabolites    1000 Barbiturate and metabolites    200 Benzodiazepine                 200 Opiates and metabolites        300 Cocaine and metabolites        300 THC                            50 Performed at [facility] Lab, [address], [place], [place] [address]     Blood Alcohol level:  Lab Results  Component Value Date   [facility] <10 08/11/2023   ETH <10 07/07/2023    Metabolic Disorder Labs: Lab Results  Component Value Date   HGBA1C 6.9 (H) 08/11/2023   MPG 151.33 08/11/2023   MPG 114 07/09/2023   Lab Results  Component Value Date   PROLACTIN 6.8 08/11/2023   Lab Results  Component Value Date   CHOL 244 (H) 08/11/2023   TRIG 528 (H) 08/11/2023   HDL 79 08/11/2023  CHOLHDL 3.1 08/11/2023   VLDL UNABLE TO CALCULATE IF TRIGLYCERIDE OVER 400 mg/dL 28/41/3244   LDLCALC UNABLE TO CALCULATE IF TRIGLYCERIDE OVER 400 mg/dL 12/25/7251   LDLCALC 90 06/28/2013    Therapeutic Lab Levels: No results found for: "LITHIUM" No results found for: "VALPROATE" No results found for: "CBMZ"  Physical Findings   AUDIT     Flowsheet Row Ad[facility] (Discharged) [facility]  Alcohol Use Disorder Identification Test Final Score (AUDIT) 27      PHQ2-9    Flowsheet Row ED from 08/11/2023 in [facility] ED from 12/31/2020 in [facility]  PHQ-2 Total Score 1 2  PHQ-9 Total Score -- 16      Flowsheet Row ED from 08/11/2023 in [facility] ED from 07/09/2023 in [facility] ED from 07/08/2023 in [facility] Emergency Department at [facility]  C-SSRS RISK CATEGORY No Risk No Risk No Risk        Musculoskeletal  Strength & Muscle Tone: within normal limits Gait & Station: normal Patient leans: N/A  Psychiatric Specialty Exam  Presentation General Appearance: Appropriate for Environment  Eye Contact:Fair  Speech:Clear and Coherent  Speech Volume:Normal  Handedness:-- (not assessed)   Mood and Affect  Mood:Euthymic  Affect:Congruent   Thought Process  Thought Processes:Coherent; Linear  Descriptions of Associations:Intact  Orientation:Full (Time, Place and Person)  Thought Content:Logical    Hallucinations:Hallucinations: None  Ideas of Reference:None  Suicidal Thoughts:Suicidal Thoughts: No  Homicidal Thoughts:Homicidal Thoughts: No   Sensorium  Memory:Immediate Fair; Recent Fair; Remote Fair  Judgment:Fair  Insight:Fair   Executive Functions  Concentration:Fair  Attention Span:Fair  Recall:Fair  Fund of Knowledge:Fair  Language:Fair   Psychomotor Activity  Psychomotor Activity:Psychomotor Activity: Normal   Assets  Assets:Communication Skills; Resilience   Sleep  Sleep:Sleep: Fair    Physical Exam Constitutional:      Appearance: the patient is not toxic-appearing.  Pulmonary:     Effort: Pulmonary effort is normal.  Neurological:     General: No focal deficit  present.     Mental Status: the patient is alert and oriented to person, place, and time.   Review of Systems  Respiratory:  Negative for shortness of breath.   Cardiovascular:  Negative for chest pain.  Gastrointestinal:  Negative for abdominal pain, constipation, diarrhea, nausea and vomiting.  Neurological:  Negative for headaches.    BP 129/80 (BP Location: Left Arm)   Pulse 84   Temp 98.3 F (36.8 C)   Resp 16   SpO2 99%      Treatment Plan Summary: Daily contact with patient to assess and evaluate symptoms and progress in treatment and Medication management  Alcohol use disorder - Presently the patient is past the window for withdrawal - Residential rehab placement  Substance-induced mood disorder - Discontinue Celexa 20 mg daily - Small to 60 mg daily - Continue Remeron 15 mg nightly - Continue doxepin 10 mg but only nightly (patient was previously taking this medication twice daily).  Prolonged QTc - Repeat EKG  - Continue medications for general medical problems as ordered   Carlyn Reichert, MD 08/16/2023 11:30 AM

## 2023-08-16 NOTE — ED Notes (Signed)
Patient has been awake and alert on unit.  He is calm and pleasant on approach.  He is cooperative with care and took meds after eating breakfast this morning.  Patient makes needs known and is without distress or complaint.  Will monitor.

## 2023-08-16 NOTE — ED Notes (Signed)
Patient is in the bedroom sleeping currently. NAD, respirations even and unlabored. Will continue to monitor for safety.

## 2023-08-16 NOTE — Group Note (Signed)
Group Topic: Communication  Group Date: 08/16/2023 Start Time: 1800 End Time: 1820 Facilitators: Vonzell Schlatter B  Department: [facility]  Number of Participants: 3  Group Focus: communication Treatment Modality:  Psychoeducation Interventions utilized were support Purpose: express feelings and increase insight  Name: [name] [name]. Date of Birth: 1961/09/04  MR: [id number]    Level of Participation: active Quality of Participation: attentive and cooperative Interactions with others: gave feedback Mood/Affect: positive Triggers (if applicable): n/a Cognition: coherent/clear Progress: Moderate Response: n/a Plan: follow-up needed  Patients Problems:  Patient Active Problem List   Diagnosis Date Noted   Alcohol use disorder 08/16/2023   Alcohol withdrawal (HCC) 08/12/2023   Polysubstance abuse (HCC) 08/11/2023   Substance induced mood disorder (HCC) 01/19/2021   Alcohol use disorder, severe, dependence (HCC) 01/05/2021   Cocaine use disorder, moderate, dependence (HCC) 01/05/2021   Depression 01/05/2021   Type 2 diabetes mellitus with diabetic neuropathy, without long-term current use of insulin (HCC) 09/21/2020   Septic arthritis (HCC) 08/09/2019   S/P right knee arthroscopy 07/29/2019 08/03/2019   Acute lateral meniscus tear of right knee    Acute medial meniscus tear of right knee    Substance use disorder 03/16/2019   History of diabetes mellitus 10/10/2016   Avascular necrosis of hip, left (HCC) 10/04/2016   Status post left hip replacement 10/04/2016   Rash and nonspecific skin eruption 08/12/2013   Cholelithiases 08/04/2013   Subcutaneous nodule 07/21/2013   Acute alcoholic pancreatitis 04/24/2013   Syncope 04/11/2013   Prolonged Q-T interval on ECG 04/11/2013   Cocaine abuse (HCC) 03/13/2013    Class: Chronic   At risk for adverse drug event 02/14/2013   DDD (degenerative disc disease), lumbar 02/01/2013   Dyspepsia 12/01/2012    BPH (benign prostatic hyperplasia) 10/07/2012   Allergic rhinitis 10/03/2012   Transaminitis 10/02/2012   Chronic pain syndrome 10/02/2012   Essential hypertension, benign 07/01/2012   Tobacco user 07/01/2012   Hepatic steatosis 03/06/2012   ED (erectile dysfunction) 03/06/2012   Pseudocyst of pancreas 02/28/2012   Alcohol abuse 02/25/2012   GERD (gastroesophageal reflux disease) 02/23/2012   Bipolar 1 disorder (HCC) 02/23/2012   Hypertriglyceridemia 12/05/2011

## 2023-08-16 NOTE — ED Notes (Signed)
Patient is the Dayroom watching TV with other colleagues currently. NAD, respirations even and unlabored. Will continue to monitor for safety.

## 2023-08-17 DIAGNOSIS — F1090 Alcohol use, unspecified, uncomplicated: Secondary | ICD-10-CM | POA: Diagnosis not present

## 2023-08-17 DIAGNOSIS — F1994 Other psychoactive substance use, unspecified with psychoactive substance-induced mood disorder: Secondary | ICD-10-CM | POA: Diagnosis not present

## 2023-08-17 DIAGNOSIS — E119 Type 2 diabetes mellitus without complications: Secondary | ICD-10-CM | POA: Diagnosis not present

## 2023-08-17 LAB — GLUCOSE, CAPILLARY
Glucose-Capillary: 406 mg/dL — ABNORMAL HIGH (ref 70–99)
Glucose-Capillary: 429 mg/dL — ABNORMAL HIGH (ref 70–99)
Glucose-Capillary: 369 mg/dL — ABNORMAL HIGH (ref 70–99)
Glucose-Capillary: 281 mg/dL — ABNORMAL HIGH (ref 70–99)
Glucose-Capillary: 429 mg/dL — ABNORMAL HIGH (ref 70–99)

## 2023-08-17 MED ORDER — INSULIN ASPART 100 UNIT/ML IJ SOLN
0.0000 [IU] | Freq: Three times a day (TID) | INTRAMUSCULAR | Status: DC
  Administered 2023-08-17: 4 [IU] via SUBCUTANEOUS
  Administered 2023-08-18: 6 [IU] via SUBCUTANEOUS

## 2023-08-17 MED ORDER — INSULIN ASPART 100 UNIT/ML IJ SOLN
5.0000 [IU] | Freq: Once | INTRAMUSCULAR | Status: AC
  Administered 2023-08-17: 5 [IU] via SUBCUTANEOUS

## 2023-08-17 MED ORDER — INSULIN GLARGINE-YFGN 100 UNIT/ML ~~LOC~~ SOLN
20.0000 [IU] | Freq: Every day | SUBCUTANEOUS | Status: DC
  Administered 2023-08-17: 20 [IU] via SUBCUTANEOUS

## 2023-08-17 MED ORDER — INSULIN ASPART 100 UNIT/ML IJ SOLN
10.0000 [IU] | Freq: Once | INTRAMUSCULAR | Status: AC
  Administered 2023-08-17: 10 [IU] via SUBCUTANEOUS

## 2023-08-17 MED ORDER — INSULIN ASPART 100 UNIT/ML IJ SOLN
0.0000 [IU] | Freq: Every day | INTRAMUSCULAR | Status: DC
  Administered 2023-08-17: 5 [IU] via SUBCUTANEOUS

## 2023-08-17 NOTE — ED Notes (Addendum)
CBG Result: 305. Covered with 4 units of Insulin Aspart; pt tolerated well. Albertina Senegal, RN and Roseanne Reno, MD notified.

## 2023-08-17 NOTE — ED Notes (Signed)
 Patient was provided dinner

## 2023-08-17 NOTE — ED Notes (Signed)
Patient resting with eyes closed. Chest rising and falling slowely. No acute distress noted. Will continue to monitor for safety.

## 2023-08-17 NOTE — ED Notes (Signed)
Patient is the hallway talking on the phone. NAD.  Respirations are even and unlabored. Will continue to monitor for safety.

## 2023-08-17 NOTE — Group Note (Signed)
Group Topic: Communication  Group Date: 08/17/2023 Start Time: 0900 End Time: 1000 Facilitators: Fortunato Curling, LPN; Prentice Docker, RN  Department: [facility]  Number of Participants: 6  Group Focus: nursing group Treatment Modality:  Individual Therapy and Psychoeducation Interventions utilized were other Medication Education Purpose: increase insight  Name: [name] [name]. Date of Birth: 1961-10-13  MR: [id number]    Level of Participation: active Quality of Participation: cooperative Interactions with others: Compliant Mood/Affect: appropriate Triggers (if applicable): N/A Cognition: insightful Progress: Gaining insight Response: Medication Compliant Plan: patient will be encouraged to continue medication compliance  Patients Problems:  Patient Active Problem List   Diagnosis Date Noted   Alcohol use disorder 08/16/2023   Alcohol withdrawal (HCC) 08/12/2023   Polysubstance abuse (HCC) 08/11/2023   Substance induced mood disorder (HCC) 01/19/2021   Alcohol use disorder, severe, dependence (HCC) 01/05/2021   Cocaine use disorder, moderate, dependence (HCC) 01/05/2021   Depression 01/05/2021   Type 2 diabetes mellitus with diabetic neuropathy, without long-term current use of insulin (HCC) 09/21/2020   Septic arthritis (HCC) 08/09/2019   S/P right knee arthroscopy 07/29/2019 08/03/2019   Acute lateral meniscus tear of right knee    Acute medial meniscus tear of right knee    Substance use disorder 03/16/2019   History of diabetes mellitus 10/10/2016   Avascular necrosis of hip, left (HCC) 10/04/2016   Status post left hip replacement 10/04/2016   Rash and nonspecific skin eruption 08/12/2013   Cholelithiases 08/04/2013   Subcutaneous nodule 07/21/2013   Acute alcoholic pancreatitis 04/24/2013   Syncope 04/11/2013   Prolonged Q-T interval on ECG 04/11/2013   Cocaine abuse (HCC) 03/13/2013    Class: Chronic   At risk for adverse drug  event 02/14/2013   DDD (degenerative disc disease), lumbar 02/01/2013   Dyspepsia 12/01/2012   BPH (benign prostatic hyperplasia) 10/07/2012   Allergic rhinitis 10/03/2012   Transaminitis 10/02/2012   Chronic pain syndrome 10/02/2012   Essential hypertension, benign 07/01/2012   Tobacco user 07/01/2012   Hepatic steatosis 03/06/2012   ED (erectile dysfunction) 03/06/2012   Pseudocyst of pancreas 02/28/2012   Alcohol abuse 02/25/2012   GERD (gastroesophageal reflux disease) 02/23/2012   Bipolar 1 disorder (HCC) 02/23/2012   Hypertriglyceridemia 12/05/2011

## 2023-08-17 NOTE — ED Notes (Signed)
Insulin Aspart 5 Units administered to pt after discussion and education on proper diet and diabetes management by Roseanne Reno, MD. Administered to Lt Lower Abdomen. Pt tolerated well.

## 2023-08-17 NOTE — ED Notes (Signed)
Pt is in the dayroom watching television and socializing with other pts. No acute distress noted. Environment is secured. Will continue to monitor for safety.

## 2023-08-17 NOTE — ED Notes (Signed)
 Patient was provided breakfast

## 2023-08-17 NOTE — ED Notes (Signed)
Patient was provided lunch.

## 2023-08-17 NOTE — ED Notes (Signed)
Capillary Blood Glucose Result: 406. Informed Roseanne Reno, MD. Stated to recheck BS in an hour.

## 2023-08-17 NOTE — ED Notes (Signed)
Patient is in the bedroom sleeping currently. NAD, respirations even and unlabored. Will continue to monitor for safety.

## 2023-08-17 NOTE — ED Provider Notes (Addendum)
Notified by nursing staff of CBG of 469, recheck showed CBG of 499.  The patient does have a history of type 2 diabetes and reports taking Lantus 20 units nightly at home.  It appears the patient has not received this medication during his stay here.  He insist that he takes this medication at home in addition to an unknown amount of sliding scale insulin.  Patient states that he was experiencing a headache this morning.  Currently his mentation is appropriate, he demonstrates a linear and logical thought process.  He was asked to stay away from sugary beverages, which he expressed ambivalence towards.  Plan: - CBG monitoring 4 times daily - NovoLog 5 units now, recheck CBG in 1 hour - Add very sensitive sliding scale insulin along with nightly correction - Start Semglee 20 units nightly, home medication  Carlyn Reichert, MD PGY-3

## 2023-08-17 NOTE — Group Note (Signed)
Group Topic: Positive Affirmations  Group Date: 08/17/2023 Start Time: 1110 End Time: 1145 Facilitators: Cassandria Anger  Department: [facility]  Number of Participants: 7  Group Focus: affirmation Treatment Modality:  Psychoeducation Interventions utilized were patient education Purpose: increase insight  Name: [name] [name]. Date of Birth: 20-Jan-1961  MR: [id number]    Level of Participation: moderate Quality of Participation: attentive and cooperative Interactions with others: gave feedback Mood/Affect: appropriate and positive Triggers (if applicable): N/A Cognition: coherent/clear Progress: Moderate Response: N/A Plan: patient will be encouraged to continue to attend group  Patients Problems:  Patient Active Problem List   Diagnosis Date Noted   Alcohol use disorder 08/16/2023   Alcohol withdrawal (HCC) 08/12/2023   Polysubstance abuse (HCC) 08/11/2023   Substance induced mood disorder (HCC) 01/19/2021   Alcohol use disorder, severe, dependence (HCC) 01/05/2021   Cocaine use disorder, moderate, dependence (HCC) 01/05/2021   Depression 01/05/2021   Type 2 diabetes mellitus with diabetic neuropathy, without long-term current use of insulin (HCC) 09/21/2020   Septic arthritis (HCC) 08/09/2019   S/P right knee arthroscopy 07/29/2019 08/03/2019   Acute lateral meniscus tear of right knee    Acute medial meniscus tear of right knee    Substance use disorder 03/16/2019   History of diabetes mellitus 10/10/2016   Avascular necrosis of hip, left (HCC) 10/04/2016   Status post left hip replacement 10/04/2016   Rash and nonspecific skin eruption 08/12/2013   Cholelithiases 08/04/2013   Subcutaneous nodule 07/21/2013   Acute alcoholic pancreatitis 04/24/2013   Syncope 04/11/2013   Prolonged Q-T interval on ECG 04/11/2013   Cocaine abuse (HCC) 03/13/2013    Class: Chronic   At risk for adverse drug event 02/14/2013   DDD (degenerative  disc disease), lumbar 02/01/2013   Dyspepsia 12/01/2012   BPH (benign prostatic hyperplasia) 10/07/2012   Allergic rhinitis 10/03/2012   Transaminitis 10/02/2012   Chronic pain syndrome 10/02/2012   Essential hypertension, benign 07/01/2012   Tobacco user 07/01/2012   Hepatic steatosis 03/06/2012   ED (erectile dysfunction) 03/06/2012   Pseudocyst of pancreas 02/28/2012   Alcohol abuse 02/25/2012   GERD (gastroesophageal reflux disease) 02/23/2012   Bipolar 1 disorder (HCC) 02/23/2012   Hypertriglyceridemia 12/05/2011

## 2023-08-17 NOTE — ED Provider Notes (Signed)
CBG 499->406 with 5U Novolog. Plan for another 10U now and recheck CBG in 1 hr. Based on results may need ED or can continue with plan as outlined in last note.    Carlyn Reichert, MD PGY-3

## 2023-08-17 NOTE — ED Notes (Signed)
Capillary Blood Glucose Result: 429. Informed Sharion Dove, RN and Roseanne Reno, MD.

## 2023-08-17 NOTE — ED Notes (Signed)
Patient A&Ox4. Denies intent to harm self/others when asked. Denies A/VH. Patient denies any physical complaints when asked. No acute distress noted. Support and encouragement provided. Routine safety checks conducted according to facility protocol. Encouraged patient to notify staff if thoughts of harm toward self or others arise. Patient verbalized understanding and agreement. Will continue to monitor for safety.

## 2023-08-17 NOTE — ED Notes (Signed)
Pt CBG is 429. Provider notified.   and RN advised to give 5units of Novolog and rechecked CBG in 2 hours. Will continue to monitor for safety.
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
## 2023-08-17 NOTE — Group Note (Signed)
Group Topic: Fears and Unhealthy Coping Skills  Group Date: 08/17/2023 Start Time: 2030 End Time: 2100 Facilitators: Vassie Loll, RN  Department: [facility]  Number of Participants: 3  Group Focus: coping skills Treatment Modality:  Psychoeducation Interventions utilized were support Purpose: enhance coping skills  Name: [name] [name]. Date of Birth: December 10, 1961  MR: [id number]    Level of Participation: active Quality of Participation: attentive Interactions with others: gave feedback Mood/Affect: appropriate Triggers (if applicable): n/a Cognition: coherent/clear Progress: Moderate Response: n/a Plan: follow-up needed  Patients Problems:  Patient Active Problem List   Diagnosis Date Noted   Alcohol use disorder 08/16/2023   Alcohol withdrawal (HCC) 08/12/2023   Polysubstance abuse (HCC) 08/11/2023   Substance induced mood disorder (HCC) 01/19/2021   Alcohol use disorder, severe, dependence (HCC) 01/05/2021   Cocaine use disorder, moderate, dependence (HCC) 01/05/2021   Depression 01/05/2021   Type 2 diabetes mellitus with diabetic neuropathy, without long-term current use of insulin (HCC) 09/21/2020   Septic arthritis (HCC) 08/09/2019   S/P right knee arthroscopy 07/29/2019 08/03/2019   Acute lateral meniscus tear of right knee    Acute medial meniscus tear of right knee    Substance use disorder 03/16/2019   History of diabetes mellitus 10/10/2016   Avascular necrosis of hip, left (HCC) 10/04/2016   Status post left hip replacement 10/04/2016   Rash and nonspecific skin eruption 08/12/2013   Cholelithiases 08/04/2013   Subcutaneous nodule 07/21/2013   Acute alcoholic pancreatitis 04/24/2013   Syncope 04/11/2013   Prolonged Q-T interval on ECG 04/11/2013   Cocaine abuse (HCC) 03/13/2013    Class: Chronic   At risk for adverse drug event 02/14/2013   DDD (degenerative disc disease), lumbar 02/01/2013   Dyspepsia 12/01/2012   BPH  (benign prostatic hyperplasia) 10/07/2012   Allergic rhinitis 10/03/2012   Transaminitis 10/02/2012   Chronic pain syndrome 10/02/2012   Essential hypertension, benign 07/01/2012   Tobacco user 07/01/2012   Hepatic steatosis 03/06/2012   ED (erectile dysfunction) 03/06/2012   Pseudocyst of pancreas 02/28/2012   Alcohol abuse 02/25/2012   GERD (gastroesophageal reflux disease) 02/23/2012   Bipolar 1 disorder (HCC) 02/23/2012   Hypertriglyceridemia 12/05/2011

## 2023-08-17 NOTE — ED Notes (Addendum)
Capillary Blood Glucose checked at the request of the pt. Stated his "head hurt and wanted it checked". At 1132, Result: 469. Rechecked with another machine at 1136; Result: 499. Informed Sharion Dove, RN and Roseanne Reno, MD.

## 2023-08-18 ENCOUNTER — Other Ambulatory Visit (INDEPENDENT_AMBULATORY_CARE_PROVIDER_SITE_OTHER)
Admission: EM | Admit: 2023-08-18 | Discharge: 2023-08-19 | Disposition: A | Discharge status: Other Acute Inpatient Hospital | Payer: 59 | Source: Home / Self Care | Admitting: Psychiatry

## 2023-08-18 ENCOUNTER — Emergency Department (HOSPITAL_COMMUNITY)
Admission: EM | Admit: 2023-08-18 | Discharge: 2023-08-18 | Disposition: A | Discharge status: Home / Self Care | Payer: 59 | Source: Home / Self Care | Attending: Emergency Medicine | Admitting: Emergency Medicine

## 2023-08-18 ENCOUNTER — Encounter (HOSPITAL_COMMUNITY): Payer: Self-pay

## 2023-08-18 ENCOUNTER — Other Ambulatory Visit: Payer: Self-pay

## 2023-08-18 DIAGNOSIS — F1190 Opioid use, unspecified, uncomplicated: Secondary | ICD-10-CM

## 2023-08-18 DIAGNOSIS — E1165 Type 2 diabetes mellitus with hyperglycemia: Secondary | ICD-10-CM | POA: Insufficient documentation

## 2023-08-18 DIAGNOSIS — Z794 Long term (current) use of insulin: Secondary | ICD-10-CM | POA: Insufficient documentation

## 2023-08-18 DIAGNOSIS — F1994 Other psychoactive substance use, unspecified with psychoactive substance-induced mood disorder: Secondary | ICD-10-CM | POA: Diagnosis not present

## 2023-08-18 DIAGNOSIS — F1510 Other stimulant abuse, uncomplicated: Secondary | ICD-10-CM | POA: Insufficient documentation

## 2023-08-18 DIAGNOSIS — F1090 Alcohol use, unspecified, uncomplicated: Secondary | ICD-10-CM | POA: Diagnosis present

## 2023-08-18 DIAGNOSIS — Z7984 Long term (current) use of oral hypoglycemic drugs: Secondary | ICD-10-CM | POA: Insufficient documentation

## 2023-08-18 DIAGNOSIS — F1590 Other stimulant use, unspecified, uncomplicated: Secondary | ICD-10-CM

## 2023-08-18 DIAGNOSIS — Z5900 Homelessness unspecified: Secondary | ICD-10-CM | POA: Insufficient documentation

## 2023-08-18 DIAGNOSIS — E119 Type 2 diabetes mellitus without complications: Secondary | ICD-10-CM | POA: Insufficient documentation

## 2023-08-18 DIAGNOSIS — F319 Bipolar disorder, unspecified: Secondary | ICD-10-CM | POA: Insufficient documentation

## 2023-08-18 DIAGNOSIS — R739 Hyperglycemia, unspecified: Secondary | ICD-10-CM | POA: Diagnosis present

## 2023-08-18 LAB — BASIC METABOLIC PANEL
Anion gap: 10 (ref 5–15)
BUN: 19 mg/dL (ref 6–20)
CO2: 24 mmol/L (ref 22–32)
Calcium: 9.5 mg/dL (ref 8.9–10.3)
Chloride: 96 mmol/L — ABNORMAL LOW (ref 98–111)
Creatinine, Ser: 1.04 mg/dL (ref 0.61–1.24)
GFR, Estimated: >60 mL/min (ref >60)
Glucose, Bld: 400 mg/dL — ABNORMAL HIGH (ref 70–99)
Potassium: 4 mmol/L (ref 3.5–5.1)
Sodium: 130 mmol/L — ABNORMAL LOW (ref 135–145)

## 2023-08-18 LAB — CBG MONITORING, ED
Glucose-Capillary: 477 mg/dL — ABNORMAL HIGH (ref 70–99)
Glucose-Capillary: 240 mg/dL — ABNORMAL HIGH (ref 70–99)

## 2023-08-18 LAB — CBC
HCT: 42.7 % (ref 39.0–52.0)
Hemoglobin: 14.1 g/dL (ref 13.0–17.0)
MCH: 30.3 pg (ref 26.0–34.0)
MCHC: 33 g/dL (ref 30.0–36.0)
MCV: 91.6 fL (ref 80.0–100.0)
Platelets: 258 10*3/uL (ref 150–400)
RBC: 4.66 MIL/uL (ref 4.22–5.81)
RDW: 15.5 % (ref 11.5–15.5)
WBC: 4.2 10*3/uL (ref 4.0–10.5)
nRBC: 0 % (ref 0.0–0.2)

## 2023-08-18 LAB — GLUCOSE, CAPILLARY
Glucose-Capillary: 130 mg/dL — ABNORMAL HIGH (ref 70–99)
Glucose-Capillary: 184 mg/dL — ABNORMAL HIGH (ref 70–99)
Glucose-Capillary: 305 mg/dL — ABNORMAL HIGH (ref 70–99)
Glucose-Capillary: 326 mg/dL — ABNORMAL HIGH (ref 70–99)
Glucose-Capillary: 403 mg/dL — ABNORMAL HIGH (ref 70–99)
Glucose-Capillary: 416 mg/dL — ABNORMAL HIGH (ref 70–99)
Glucose-Capillary: 499 mg/dL — ABNORMAL HIGH (ref 70–99)

## 2023-08-18 MED ORDER — BISMUTH SUBSALICYLATE 262 MG PO CHEW: 524.0000 mg | CHEWABLE_TABLET | ORAL | Status: DC | PRN

## 2023-08-18 MED ORDER — PANTOPRAZOLE SODIUM 40 MG PO TBEC: 40.0000 mg | DELAYED_RELEASE_TABLET | Freq: Every day | ORAL | 0 refills | Status: DC

## 2023-08-18 MED ORDER — ACETAMINOPHEN 325 MG PO TABS
650.0000 mg | ORAL_TABLET | Freq: Four times a day (QID) | ORAL | Status: DC | PRN
  Administered 2023-08-19: 650 mg via ORAL
  Filled 2023-08-18: qty 2

## 2023-08-18 MED ORDER — METFORMIN HCL 500 MG PO TABS: 500.0000 mg | ORAL_TABLET | Freq: Two times a day (BID) | ORAL | 0 refills | Status: DC

## 2023-08-18 MED ORDER — HALOPERIDOL 5 MG PO TABS: 5.0000 mg | ORAL_TABLET | Freq: Four times a day (QID) | ORAL | Status: DC | PRN

## 2023-08-18 MED ORDER — ACETAMINOPHEN 325 MG PO TABS
650.0000 mg | ORAL_TABLET | Freq: Once | ORAL | Status: AC
  Administered 2023-08-18: 650 mg via ORAL
  Filled 2023-08-18: qty 2

## 2023-08-18 MED ORDER — MIRTAZAPINE 15 MG PO TABS
15.0000 mg | ORAL_TABLET | Freq: Every day | ORAL | Status: DC
  Administered 2023-08-18: 15 mg via ORAL
  Filled 2023-08-18: qty 1

## 2023-08-18 MED ORDER — INSULIN ASPART 100 UNIT/ML IJ SOLN
0.0000 [IU] | Freq: Every day | INTRAMUSCULAR | Status: DC
  Administered 2023-08-18: 2 [IU] via SUBCUTANEOUS

## 2023-08-18 MED ORDER — LISINOPRIL 10 MG PO TABS
10.0000 mg | ORAL_TABLET | Freq: Every day | ORAL | Status: DC
  Administered 2023-08-19: 10 mg via ORAL
  Filled 2023-08-18: qty 1

## 2023-08-18 MED ORDER — GLIPIZIDE 5 MG PO TABS
5.0000 mg | ORAL_TABLET | Freq: Every day | ORAL | Status: DC
  Administered 2023-08-19: 5 mg via ORAL
  Filled 2023-08-18: qty 1

## 2023-08-18 MED ORDER — DIPHENHYDRAMINE HCL 25 MG PO CAPS: 25.0000 mg | ORAL_CAPSULE | Freq: Four times a day (QID) | ORAL | Status: DC | PRN

## 2023-08-18 MED ORDER — PANTOPRAZOLE SODIUM 40 MG PO TBEC
40.0000 mg | DELAYED_RELEASE_TABLET | Freq: Every day | ORAL | Status: DC
  Administered 2023-08-19: 40 mg via ORAL
  Filled 2023-08-18: qty 1

## 2023-08-18 MED ORDER — GABAPENTIN 300 MG PO CAPS: 600.0000 mg | ORAL_CAPSULE | Freq: Three times a day (TID) | ORAL | 0 refills | Status: DC

## 2023-08-18 MED ORDER — HYDROXYZINE HCL 25 MG PO TABS: 25.0000 mg | ORAL_TABLET | Freq: Three times a day (TID) | ORAL | Status: DC | PRN

## 2023-08-18 MED ORDER — INSULIN GLARGINE-YFGN 100 UNIT/ML ~~LOC~~ SOLN: 20.0000 [IU] | Freq: Every day | SUBCUTANEOUS | Status: DC

## 2023-08-18 MED ORDER — DOXEPIN HCL 10 MG PO CAPS: 10.0000 mg | ORAL_CAPSULE | Freq: Every day | ORAL | 0 refills | Status: DC

## 2023-08-18 MED ORDER — METHOCARBAMOL 500 MG PO TABS: 500.0000 mg | ORAL_TABLET | Freq: Two times a day (BID) | ORAL | 0 refills | Status: AC | PRN

## 2023-08-18 MED ORDER — ALUM & MAG HYDROXIDE-SIMETH 200-200-20 MG/5ML PO SUSP
30.0000 mL | ORAL | Status: DC | PRN
  Administered 2023-08-18: 30 mL via ORAL
  Filled 2023-08-18: qty 30

## 2023-08-18 MED ORDER — INSULIN GLARGINE-YFGN 100 UNIT/ML ~~LOC~~ SOLN
10.0000 [IU] | Freq: Every day | SUBCUTANEOUS | Status: DC
  Administered 2023-08-18: 10 [IU] via SUBCUTANEOUS

## 2023-08-18 MED ORDER — INSULIN ASPART 100 UNIT/ML IJ SOLN: 0.0000 [IU] | Freq: Every day | INTRAMUSCULAR | 0 refills | Status: DC

## 2023-08-18 MED ORDER — MIRTAZAPINE 15 MG PO TABS: 15.0000 mg | ORAL_TABLET | Freq: Every day | ORAL | 0 refills | Status: DC

## 2023-08-18 MED ORDER — GEMFIBROZIL 600 MG PO TABS
600.0000 mg | ORAL_TABLET | Freq: Two times a day (BID) | ORAL | Status: DC
  Administered 2023-08-18 – 2023-08-19 (×2): 600 mg via ORAL
  Filled 2023-08-18 (×2): qty 1

## 2023-08-18 MED ORDER — TETANUS-DIPHTH-ACELL PERTUSSIS 5-2.5-18.5 LF-MCG/0.5 IM SUSY: 0.5000 mL | PREFILLED_SYRINGE | Freq: Once | INTRAMUSCULAR | Status: DC

## 2023-08-18 MED ORDER — INSULIN ASPART 100 UNIT/ML IJ SOLN: 0.0000 [IU] | Freq: Three times a day (TID) | INTRAMUSCULAR | 0 refills | Status: AC

## 2023-08-18 MED ORDER — DOXEPIN HCL 10 MG PO CAPS
10.0000 mg | ORAL_CAPSULE | Freq: Every day | ORAL | Status: DC
  Administered 2023-08-18: 10 mg via ORAL
  Filled 2023-08-18: qty 1

## 2023-08-18 MED ORDER — MELOXICAM 7.5 MG PO TABS
15.0000 mg | ORAL_TABLET | Freq: Every day | ORAL | Status: DC
  Administered 2023-08-19: 15 mg via ORAL
  Filled 2023-08-18: qty 2

## 2023-08-18 MED ORDER — INSULIN ASPART 100 UNIT/ML IJ SOLN: 0.0000 [IU] | Freq: Every day | INTRAMUSCULAR | 11 refills | Status: DC

## 2023-08-18 MED ORDER — INSULIN GLARGINE-YFGN 100 UNIT/ML ~~LOC~~ SOLN: 10.0000 [IU] | Freq: Every day | SUBCUTANEOUS | 0 refills | Status: AC

## 2023-08-18 MED ORDER — INSULIN ASPART 100 UNIT/ML IJ SOLN: 0.0000 [IU] | Freq: Three times a day (TID) | INTRAMUSCULAR | 11 refills | Status: DC

## 2023-08-18 MED ORDER — INSULIN ASPART 100 UNIT/ML IJ SOLN
0.0000 [IU] | Freq: Three times a day (TID) | INTRAMUSCULAR | Status: DC
  Administered 2023-08-18: 1 [IU] via SUBCUTANEOUS
  Administered 2023-08-19: 3 [IU] via SUBCUTANEOUS

## 2023-08-18 MED ORDER — LIDOCAINE HCL (PF) 1 % IJ SOLN: 5.0000 mL | Freq: Once | INTRAMUSCULAR | Status: DC

## 2023-08-18 MED ORDER — CEFAZOLIN SODIUM 1 G IM
1.0000 g | Freq: Once | INTRAMUSCULAR | Status: DC
Start: 2023-08-18 — End: 2023-08-18

## 2023-08-18 MED ORDER — ONDANSETRON 4 MG PO TBDP: 8.0000 mg | ORAL_TABLET | Freq: Three times a day (TID) | ORAL | Status: DC | PRN

## 2023-08-18 MED ORDER — METHOCARBAMOL 500 MG PO TABS
500.0000 mg | ORAL_TABLET | Freq: Two times a day (BID) | ORAL | Status: DC | PRN
  Administered 2023-08-18 (×2): 500 mg via ORAL
  Filled 2023-08-18 (×2): qty 1

## 2023-08-18 MED ORDER — POLYETHYLENE GLYCOL 3350 17 G PO PACK: 17.0000 g | PACK | Freq: Every day | ORAL | Status: DC | PRN

## 2023-08-18 MED ORDER — DIPHENHYDRAMINE HCL 50 MG/ML IJ SOLN: 25.0000 mg | Freq: Four times a day (QID) | INTRAMUSCULAR | Status: DC | PRN

## 2023-08-18 MED ORDER — DULOXETINE HCL 60 MG PO CPEP
60.0000 mg | ORAL_CAPSULE | Freq: Every day | ORAL | Status: DC
  Administered 2023-08-19: 60 mg via ORAL
  Filled 2023-08-18: qty 1

## 2023-08-18 MED ORDER — GABAPENTIN 300 MG PO CAPS
600.0000 mg | ORAL_CAPSULE | Freq: Three times a day (TID) | ORAL | Status: DC
  Administered 2023-08-18 – 2023-08-19 (×3): 600 mg via ORAL
  Filled 2023-08-18 (×3): qty 2

## 2023-08-18 MED ORDER — AMLODIPINE BESYLATE 5 MG PO TABS
5.0000 mg | ORAL_TABLET | Freq: Every day | ORAL | Status: DC
  Administered 2023-08-19: 5 mg via ORAL
  Filled 2023-08-18: qty 1

## 2023-08-18 MED ORDER — AMLODIPINE BESYLATE 5 MG PO TABS: 5.0000 mg | ORAL_TABLET | Freq: Every day | ORAL | 0 refills | Status: DC

## 2023-08-18 MED ORDER — HALOPERIDOL LACTATE 5 MG/ML IJ SOLN: 5.0000 mg | Freq: Four times a day (QID) | INTRAMUSCULAR | Status: DC | PRN

## 2023-08-18 MED ORDER — LACTATED RINGERS IV BOLUS
1000.0000 mL | Freq: Once | INTRAVENOUS | Status: AC
  Administered 2023-08-18: 1000 mL via INTRAVENOUS

## 2023-08-18 MED ORDER — SENNA 8.6 MG PO TABS: 1.0000 | ORAL_TABLET | Freq: Every evening | ORAL | Status: DC | PRN

## 2023-08-18 MED ORDER — DULOXETINE HCL 60 MG PO CPEP: 60.0000 mg | ORAL_CAPSULE | Freq: Every day | ORAL | 0 refills | Status: DC

## 2023-08-18 MED ORDER — LORAZEPAM 1 MG PO TABS: 1.0000 mg | ORAL_TABLET | Freq: Four times a day (QID) | ORAL | Status: DC | PRN

## 2023-08-18 MED ORDER — GEMFIBROZIL 600 MG PO TABS: 600.0000 mg | ORAL_TABLET | Freq: Two times a day (BID) | ORAL | 0 refills | Status: DC

## 2023-08-18 MED ORDER — INSULIN ASPART 100 UNIT/ML IJ SOLN
10.0000 [IU] | Freq: Once | INTRAMUSCULAR | Status: AC
  Administered 2023-08-18: 10 [IU] via SUBCUTANEOUS

## 2023-08-18 MED ORDER — INSULIN GLARGINE-YFGN 100 UNIT/ML ~~LOC~~ SOLN: 20.0000 [IU] | Freq: Every day | SUBCUTANEOUS | 11 refills | Status: DC

## 2023-08-18 MED ORDER — METFORMIN HCL 500 MG PO TABS
500.0000 mg | ORAL_TABLET | Freq: Two times a day (BID) | ORAL | Status: DC
  Administered 2023-08-18 – 2023-08-19 (×2): 500 mg via ORAL
  Filled 2023-08-18 (×2): qty 1

## 2023-08-18 MED ORDER — TAMSULOSIN HCL 0.4 MG PO CAPS
0.4000 mg | ORAL_CAPSULE | Freq: Every day | ORAL | Status: DC
  Administered 2023-08-19: 0.4 mg via ORAL
  Filled 2023-08-18: qty 1

## 2023-08-18 MED ORDER — LISINOPRIL 10 MG PO TABS: 10.0000 mg | ORAL_TABLET | Freq: Every day | ORAL | 0 refills | Status: DC

## 2023-08-18 MED ORDER — LORAZEPAM 2 MG/ML IJ SOLN: 1.0000 mg | Freq: Four times a day (QID) | INTRAMUSCULAR | Status: DC | PRN

## 2023-08-18 MED ORDER — INSULIN GLARGINE-YFGN 100 UNIT/ML ~~LOC~~ SOLN
10.0000 [IU] | Freq: Once | SUBCUTANEOUS | Status: AC
  Administered 2023-08-18: 10 [IU] via SUBCUTANEOUS
  Filled 2023-08-18: qty 0.1

## 2023-08-18 NOTE — ED Notes (Signed)
Patient is sleeping. Respirations equal and unlabored, skin warm and dry. No change in assessment or acuity. Routine safety checks conducted according to facility protocol. Will continue to monitor for safety.   

## 2023-08-18 NOTE — Progress Notes (Signed)
Pt is wake, alert and oriented X4. Pt complained of back pain. PRN Naproxen and scheduled meds with no issue. Pt denies current SI/HI/AVH, plan or intent. Staff will monitor for pt's safety.

## 2023-08-18 NOTE — ED Notes (Signed)
Safe Transport Called pt to [facility]

## 2023-08-18 NOTE — ED Provider Notes (Cosign Needed Addendum)
Facility Based Crisis Admission H&P  Date: 08/18/23 Patient Name: [name] [name]. MRN: [id number] Chief Complaint: "detox from substance"  Diagnoses:  Final diagnoses:  Alcohol use disorder  Stimulant use disorder  Opioid use disorder   HPI:  [name] Argent. is a 62 y.o., male with a past psychiatric history of bipolar disorder, polysubstance abuse, and depression who was discharged from [facility], after several days of treatment for polysubstance use disorder, to [facility] ED due to uncontrolled glucose and subsequently readmitted to [facility] to await placement for residential rehab in [place] tomorrow (8/27).  Patient returned from [facility] ED and immediately asked to leave. I spoke to patient along with Social Worker Ava and patient was convinced to stay one more night until he can leave for [place] residential rehab tomorrow morning.  He endorses feeling very hungry and asks for food, water, and diet beverages.  PHQ 2-9:  Flowsheet Row ED from 12/31/2020 in [facility]  Thoughts that you would be better off dead, or of hurting yourself in some way Several days  [Phreesia 12/31/2020]  PHQ-9 Total Score 16       Flowsheet Row ED from 08/18/2023 in [facility] at [facility] ED from 08/11/2023 in [facility] ED from 07/09/2023 in [facility] RISK CATEGORY No Risk No Risk No Risk        Total Time spent with patient: 45 minutes  Musculoskeletal  Strength & Muscle Tone: within normal limits Gait & Station: normal Patient leans: N/A  Psychiatric Specialty Exam  Presentation General Appearance: Casual   Eye Contact:Good   Speech:Clear and Coherent; Normal Rate   Speech Volume:Normal   Handedness:Right   Mood and Affect  Mood:-- ("I'm hungry I had nothing to eat")   Affect:Congruent; Full Range (irritable)   Thought Process  Thought  Processes:Coherent; Linear; Goal Directed   Descriptions of Associations:Intact   Orientation:Full (Time, Place and Person)   Thought Content:Logical; WDL  Diagnosis of Schizophrenia or Schizoaffective disorder in past: No data recorded   Hallucinations:Hallucinations: None   Ideas of Reference:None   Suicidal Thoughts:Suicidal Thoughts: No   Homicidal Thoughts:Homicidal Thoughts: No   Sensorium  Memory:Immediate Good; Recent Good; Remote Good   Judgment:Poor   Insight:Poor   Executive Functions  Concentration:Good   Attention Span:Good   Recall:Good   Fund of Knowledge:Good   Language:Good   Psychomotor Activity  Psychomotor Activity:Psychomotor Activity: Normal   Assets  Assets:Resilience   Sleep  Sleep:Sleep: Good   No data recorded  Physical Exam Vitals and nursing note reviewed.  HENT:     Head: Normocephalic and atraumatic.  Pulmonary:     Effort: Pulmonary effort is normal.  Musculoskeletal:     Cervical back: Normal range of motion.  Neurological:     General: No focal deficit present.     Mental Status: He is alert. Mental status is at baseline.    Review of Systems  Constitutional: Negative.   Respiratory: Negative.    Cardiovascular: Negative.   Gastrointestinal: Negative.   Genitourinary: Negative.   Psychiatric/Behavioral:         Psychiatric subjective data addressed in PSE or HPI / daily subjective report   Blood pressure (!) 134/91, pulse (!) 103, temperature 98.4 F (36.9 C), temperature source Oral, resp. rate 17, SpO2 98%. There is no height or weight on file to calculate BMI.  Previous psychiatric diagnoses: Bipolar disorder, depression, polysubstance abuse  Is the patient at risk to self? No Has the patient been a risk to self in the past 6 months? No Has the patient been a risk to self within the distant past? Unknown Is the patient a risk to others? No Has the patient been a risk to others in the  past 6 months? No Has the patient been a risk to others within the distant past? Unknown  Past Medical History: Hypertriglyceridemia, GERD, HTN, chronic pain syndrome, BPH, degenerative disc disease, type 2 diabetes mellitus Allergies: Bee venom and Penicillins Family History: not discussed at this time  Social History: Homeless and unemployed (disability since 2013 for back pain). Single with 1 child. Previously lived in [place], [place]. Family in [place].  Substance Use History:  Tobacco: uses 1 pack of cigarettes over a 3 day period  Alcohol: daily (1 pint of liquor and 12 pack of beer) Marijuana: daily  Cocaine: 0.5 g daily  Opioids: Heroin (occasional, 2 times in the past month) Rehab: [facility], [facility]  Last Labs:     Latest Ref Rng & Units 08/18/2023   10:08 AM 08/11/2023    4:04 PM 07/07/2023   12:50 PM  CMP  Glucose 70 - 99 mg/dL 295  284  85   BUN 6 - 20 mg/dL 19  20  12    Creatinine 0.61 - 1.24 mg/dL 1.32  4.40  1.02   Sodium 135 - 145 mmol/L 130  139  140   Potassium 3.5 - 5.1 mmol/L 4.0  4.5  3.7   Chloride 98 - 111 mmol/L 96  97  105   CO2 22 - 32 mmol/L 24  27  26    Calcium 8.9 - 10.3 mg/dL 9.5  9.7  9.1   Total Protein 6.5 - 8.1 g/dL  7.1  7.2   Total Bilirubin 0.3 - 1.2 mg/dL  0.3  0.7   Alkaline Phos 38 - 126 U/L  41  30   AST 15 - 41 U/L  34  26   ALT 0 - 44 U/L  17  20   CBC    Component Value Date/Time   WBC 4.2 08/18/2023 1008   RBC 4.66 08/18/2023 1008   HGB 14.1 08/18/2023 1008   HCT 42.7 08/18/2023 1008   PLT 258 08/18/2023 1008   MCV 91.6 08/18/2023 1008   MCH 30.3 08/18/2023 1008   MCHC 33.0 08/18/2023 1008   RDW 15.5 08/18/2023 1008   LYMPHSABS 2.0 08/11/2023 1604   MONOABS 0.7 08/11/2023 1604   EOSABS 0.1 08/11/2023 1604   BASOSABS 0.0 08/11/2023 1604    Allergies: Bee venom and Penicillins  PTA Medications: (Not in a hospital admission)   Long Term Goals: Improvement in symptoms so as ready for discharge  Short  Term Goals: Patient will verbalize feelings in meetings with treatment team members. and Patient will take medications as prescribed daily.  Medical Decision Making  -- continue home duloxetine 60 mg daily for depressive symptoms and neuropathic pain -- continue home gabapentin 600 mg TID for neuropathic pain and alcohol cravings -- continue home mirtazapine 15 mg at bedtime for depressive symptoms and sleep -- continue doxepin 10 mg at bedtime for sleep initiation and maintenance              -- CIWA with as needed chlordiazepoxide protocol              -- medical regimen: meloxicam, amlodipine, gemfibrozil, lisinopril, glipizide, metformin, insulin regimen, pantoprazole, methocarbamol, and  tamsulosin -- Patient does not need nicotine replacement  PRNs              -- start acetaminophen 650 mg every 6 hours as needed for mild to moderate pain, fever, and headaches              -- start hydroxyzine 25 mg three times a day as needed for anxiety              -- start bismuth subsalicylate 524 mg oral chewable tablet every 3 hours as needed for diarrhea / loose stools              -- start senna 8.6 mg oral at bedtime and polyethylene glycol 17 g oral daily as needed for mild to moderate constipation              -- start ondansetron 8 mg every 8 hours as needed for nausea or vomiting              -- start aluminum-magnesium hydroxide + simethicone 30 mL every 4 hours as needed for heartburn or indigestion  -- As needed agitation protocol in-place  Recommendations  Based on my evaluation the patient does not appear to have an emergency medical condition.  Augusto Gamble, MD 08/18/23  2:55 PM   Augusto Gamble, MD 08/18/23 1455

## 2023-08-18 NOTE — Progress Notes (Signed)
Pt was visible in the milieu and was appropriate this shift. No distress noted or concerns voiced. Staff will monitor for pt's safety.

## 2023-08-18 NOTE — ED Provider Notes (Signed)
[place] EMERGENCY DEPARTMENT AT [facility] Provider Note   CSN: [id number] Arrival date & time: 08/18/23  4098     History  Chief Complaint  Patient presents with   Hyperglycemia    Shenan Tipple. is a 62 y.o. male.  62 year old male here today from [facility] for hyperglycemia.  Patient has diabetes, they have been struggling to get his glucose at a "reasonable" number.  They sent him to the emergency department for tighter glycemic control.  I reviewed the patient's medications in his chart.  Patient reportedly has not taken his insulin in over 1 month.  He tells me that he does not take any of his insulin less his sugars over 200.          Home Medications Prior to Admission medications   Medication Sig Start Date End Date Taking? Authorizing Provider  amLODipine (NORVASC) 5 MG tablet Take 1 tablet (5 mg total) by mouth daily. 05/05/21   Money, Gerlene Burdock, FNP  doxepin (SINEQUAN) 10 MG capsule Take 1 capsule (10 mg total) by mouth at bedtime. 08/18/23 09/17/23  Augusto Gamble, MD  DULoxetine (CYMBALTA) 60 MG capsule Take 60 mg by mouth daily.    [provider]  gabapentin (NEURONTIN) 300 MG capsule Take 600 mg by mouth 3 (three) times daily.    [provider]  gemfibrozil (LOPID) 600 MG tablet Take 600 mg by mouth 2 (two) times daily before a meal.    [provider]  glipiZIDE (GLUCOTROL) 5 MG tablet Take 1 tablet (5 mg total) by mouth daily. 05/04/21   Money, Gerlene Burdock, FNP  insulin aspart (NOVOLOG) 100 UNIT/ML injection Inject 0-5 Units into the skin at bedtime. 08/18/23   Augusto Gamble, MD  insulin aspart (NOVOLOG) 100 UNIT/ML injection Inject 0-6 Units into the skin 3 (three) times daily with meals. 08/18/23   Augusto Gamble, MD  insulin glargine (LANTUS) 100 UNIT/ML injection Inject 0.2 mLs (20 Units total) into the skin daily. Patient taking differently: Inject 2-8 Units into the skin daily as needed. Sliding scale 2-8 05/05/21   Money, Gerlene Burdock, FNP  insulin glargine-yfgn (SEMGLEE) 100 UNIT/ML injection Inject 0.2 mLs (20 Units total) into the skin at bedtime. 08/18/23   Augusto Gamble, MD  lisinopril (ZESTRIL) 10 MG tablet Take 10 mg by mouth daily.    [provider]  meloxicam (MOBIC) 15 MG tablet Take 15 mg by mouth daily.    [provider]  metFORMIN (GLUCOPHAGE) 500 MG tablet Take 2 tablets (1,000 mg total) by mouth 2 (two) times daily with a meal. Patient taking differently: Take 500 mg by mouth 2 (two) times daily with a meal. 05/04/21   Money, Gerlene Burdock, FNP  methocarbamol (ROBAXIN) 500 MG tablet Take 1 tablet (500 mg total) by mouth 2 (two) times daily as needed for muscle spasms. 08/18/23 09/17/23  Augusto Gamble, MD  mirtazapine (REMERON) 15 MG tablet Take 15 mg by mouth at bedtime.    [provider]  pantoprazole (PROTONIX) 40 MG tablet Take 1 tablet (40 mg total) by mouth daily. 08/18/23 09/17/23  Augusto Gamble, MD  tamsulosin (FLOMAX) 0.4 MG CAPS capsule Take 1 capsule (0.4 mg total) by mouth daily. 05/04/21   Money, Gerlene Burdock, FNP      Allergies    Bee venom and Penicillins    Review of Systems   Review of Systems  Physical Exam Updated Vital Signs BP (!) 124/91   Pulse 75   Temp 98.5  F (36.9 C) (Oral)   Resp 15   Ht 5\' 10"  (1.778 m)   Wt 88.5 kg   SpO2 97%   BMI 27.98 kg/m  Physical Exam Vitals reviewed.  Cardiovascular:     Rate and Rhythm: Normal rate and regular rhythm.  Pulmonary:     Effort: Pulmonary effort is normal.     Breath sounds: Normal breath sounds.  Abdominal:     Palpations: Abdomen is soft.  Musculoskeletal:     Cervical back: Normal range of motion.  Neurological:     Mental Status: He is alert.     ED Results / Procedures / Treatments   Labs (all labs ordered are listed, but only abnormal results are displayed) Labs Reviewed  BASIC METABOLIC PANEL - Abnormal; Notable for the following components:      Result Value   Sodium 130 (*)    Chloride 96 (*)     Glucose, Bld 400 (*)    All other components within normal limits  CBG MONITORING, ED - Abnormal; Notable for the following components:   Glucose-Capillary 477 (*)    All other components within normal limits  CBG MONITORING, ED - Abnormal; Notable for the following components:   Glucose-Capillary 240 (*)    All other components within normal limits  CBC    EKG None  Radiology No results found.  Procedures Procedures    Medications Ordered in ED Medications  lactated ringers bolus 1,000 mL (0 mLs Intravenous Stopped 08/18/23 1117)  insulin aspart (novoLOG) injection 10 Units (10 Units Subcutaneous Given 08/18/23 1014)  insulin glargine-yfgn (SEMGLEE) injection 10 Units (10 Units Subcutaneous Given 08/18/23 1037)    ED Course/ Medical Decision Making/ A&P                                 Medical Decision Making 62 year old male here today for hyperglycemia.  Plan-I spoke with the psychiatry resident at [facility].  It sounds as though the patient had been planned for discharge to a facility in [place] today.  Patient has been somewhat noncompliant with his diet, has been consuming a lot of sugary beverages over the weekend.  In order for the patient to be discharged to this facility, he needs to have appropriate glycemic control.  He has not been taking any insulin recently.  Patient likely should be taking insulin.  When I look at his prior prescribed medications, this does appear to be an appropriate regimen of long-acting, and mealtime insulin.  Low suspicion for acidosis.  Will provide him with some fluids, provide some insulin here in the emergency department.  Anticipate discharge.  Reassessment-after receiving fast acting insulin, patient's sugar 240.  He has also received some long-acting.  He has no evidence of acidosis.  Patient is medically cleared.  Will print recommended insulin regimen for psychiatric facility.    Amount and/or Complexity of Data Reviewed Labs:  ordered.  Risk Prescription drug management.           Final Clinical Impression(s) / ED Diagnoses Final diagnoses:  Hyperglycemia    Rx / DC Orders ED Discharge Orders     None         Anders Simmonds T, DO 08/18/23 1142

## 2023-08-18 NOTE — ED Notes (Signed)
Patient observed/assessed at nursing station. Patient alert and oriented x 4. Affect is flat. Patient denies pain and anxiety. Complains of Heart burn and was given Mylanta PRN. He denies A/V/H. He denies having any thoughts/plan of self harm and harm towards others. Fluid and snack offered. Patient states that appetite has been good throughout the day. Verbalizes no further complaints at this time. Will continue to monitor and support.

## 2023-08-18 NOTE — Progress Notes (Addendum)
Pt's CBG was 416mg /dl @0742 .  Pt is asymptomatic. Administered 6 units of Novolog per order and MD was notified. CBG recheck was 403mg /dl @0826 . MD notified again. Awaiting orders.

## 2023-08-18 NOTE — Group Note (Signed)
Group Topic: Positive Affirmations  Group Date: 08/18/2023 Start Time: 2000 End Time: 2045 Facilitators: Caroline More, LPN  Department: [facility]  Number of Participants: 6  Group Focus: coping skills, Gratitude in Recovery Worksheet. Treatment Modality:  Psychoeducation Interventions utilized were assignment, group exercise, and story telling Purpose: enhance coping skills, express feelings, improve communication skills, increase insight, regain self-worth, and relapse prevention strategies  Name: [name] [name]. Date of Birth: 02/19/1961  MR: [id number]    Level of Participation: active Quality of Participation: cooperative Interactions with others: gave feedback Mood/Affect: appropriate Triggers (if applicable): N/A Cognition: concrete Progress: Moderate Response: Provided appropriate feedback and insightful responses throughout the exercise/group. Plan: follow-up needed  Patients Problems:  Patient Active Problem List   Diagnosis Date Noted   Opioid use disorder 08/18/2023   Alcohol use disorder 08/16/2023   Alcohol withdrawal (HCC) 08/12/2023   Polysubstance abuse (HCC) 08/11/2023   Substance induced mood disorder (HCC) 01/19/2021   Alcohol use disorder, severe, dependence (HCC) 01/05/2021   Cocaine use disorder, moderate, dependence (HCC) 01/05/2021   Depression 01/05/2021   Type 2 diabetes mellitus with diabetic neuropathy, without long-term current use of insulin (HCC) 09/21/2020   Septic arthritis (HCC) 08/09/2019   S/P right knee arthroscopy 07/29/2019 08/03/2019   Acute lateral meniscus tear of right knee    Acute medial meniscus tear of right knee    Substance use disorder 03/16/2019   History of diabetes mellitus 10/10/2016   Avascular necrosis of hip, left (HCC) 10/04/2016   Status post left hip replacement 10/04/2016   Rash and nonspecific skin eruption 08/12/2013   Cholelithiases 08/04/2013   Subcutaneous nodule  07/21/2013   Acute alcoholic pancreatitis 04/24/2013   Syncope 04/11/2013   Prolonged Q-T interval on ECG 04/11/2013   Stimulant use disorder 03/13/2013    Class: Chronic   At risk for adverse drug event 02/14/2013   DDD (degenerative disc disease), lumbar 02/01/2013   Dyspepsia 12/01/2012   BPH (benign prostatic hyperplasia) 10/07/2012   Allergic rhinitis 10/03/2012   Transaminitis 10/02/2012   Chronic pain syndrome 10/02/2012   Essential hypertension, benign 07/01/2012   Tobacco user 07/01/2012   Hepatic steatosis 03/06/2012   ED (erectile dysfunction) 03/06/2012   Pseudocyst of pancreas 02/28/2012   Alcohol abuse 02/25/2012   GERD (gastroesophageal reflux disease) 02/23/2012   Bipolar 1 disorder (HCC) 02/23/2012   Hypertriglyceridemia 12/05/2011

## 2023-08-18 NOTE — Progress Notes (Signed)
Pt transferred to [facility] due to elevated CBG and for stabilization. Pt was transported via EMS.

## 2023-08-18 NOTE — ED Triage Notes (Signed)
Pt arrives from [facility] for the c/o bg at 418 today. Pt states he has not been checking his sugar. Pt is supposed to take insulin if bs is above 200. VSS. No si or hi.

## 2023-08-18 NOTE — Progress Notes (Signed)
Pt arrived on the unit from [facility] @1409 . Skin assessment completed. Pt was seen by MD and SW. Staff will monitor for pt's safety.

## 2023-08-18 NOTE — Care Management (Addendum)
[facility] Care Management   Writer spoke [facility] Mardelle Matte [phone] ext [phone]). Patient has been accepted to their program. [facility] will coordinate transportation with the [employer] for the patient. Patient is expected to discharge and leave on the [employer] tomorrow Tuesday 08-20-2023. The patient [employer] ticket will be email to me today. Writer will inform the MD and the RN working with the patient.   2:20pm  Writer met with patient who is in agreement to go to treatment in [place] at [facility].  [facility] has coordinated transportation for the patient through [employer].  Patient will leave tomorrow for the [employer] at 8am.  Patient bus leaves at [employer].  Clinical research associate provided a taxi voucher to get to the [employer] to the nurse.

## 2023-08-18 NOTE — ED Notes (Signed)
Pt complaining of feeling dizzy, writer checked blood sugar and vital signs. CBG is 284 and vital signs were 120/80,98,81.

## 2023-08-18 NOTE — Discharge Instructions (Signed)
Dear [name] [name].,  It was a pleasure to take care of you during your stay at [facility] where you were treated for your Alcohol use disorder.  While you were here, you were:  observed and cared for by our nurses and nursing assistants  treated with medications by your psychiatrists  evaluated with imaging / lab tests, and treated with medicines / procedures by your doctors  provided individual and group therapy by therapists  provided resources by our social workers and case managers  Please review the medication list provided to you at discharge and stop, start taking, or continue taking the medications listed there.  You should also follow-up with your primary care doctor, or start seeing one if you don't have one yet. If applicable, here are some scheduled follow-ups for you:    I recommend abstinence from alcohol, tobacco, and other illicit drug use.   If your psychiatric symptoms or suicidal thoughts recur, worsen, or if you have side effects to your psychiatric medications, call your outpatient psychiatric provider, 911, [phone] or go to the nearest emergency department.  Take care!  Signed: Augusto Gamble, MD 08/18/2023, 3:41 PM  For a list of more resources, see the following:  [facility] [address] [place], [place], [address] [phone] phone  New Patient Assessment/Therapy Walk-Ins:  Monday and Wednesday: 8 am until slots are full. Every 1st and 2nd Fridays of the month: 1 pm - 5 pm.  NO ASSESSMENT/THERAPY WALK-INS ON TUESDAYS OR THURSDAYS  New Patient Assessment/Medication Management Walk-Ins:  Monday - Friday:  8 am - 11 am.  For all walk-ins, we ask that you arrive by 7:30 am because patients will be seen in the order of arrival.  Availability is limited; therefore, you may not be seen on the same day that you walk-in.  Our goal is to serve and meet the needs of our community to the best of our ability.  12 STEP  PROGRAMS:  Alcoholics Anonymous of [place] Software[employer]alet.be  Narcotics Anonymous of [place] [employer]ct.dk  Al-Anon of [place], [place] www.[place]alanon.org/find-meetings.html  Nar-Anon https://nar-anon.org/find-a-meeting  Naloxone (Narcan) can help reverse an overdose when given to the victim quickly.  [employer] offers free naloxone kits and instructions/training on its use.  Add naloxone to your first aid kit and you can help save a life. A prescription can be filled at your local pharmacy or free kits are provided by the county.  Pick up your free kit at the following locations:   [place]:  [facility] Division of [facility], [address] [place] [address] [phone]) [facility] [address] [place] [address] [phone]) [facility] [address] [place] [address]  [place]: [facility] Division of [facility] [address] [place] [address] ([phone]) [facility] [address] [place] [address] [phone])

## 2023-08-18 NOTE — ED Provider Notes (Signed)
FBC/OBS ASAP Discharge Summary  Date: 08/19/23 Name: [name] [name]. MRN: [id number]  Discharge Diagnoses:  Final diagnoses:  Alcohol use disorder  Stimulant use disorder  Opioid use disorder   [name] [name]. is a 62 y.o., male with a past psychiatric history of bipolar disorder, polysubstance abuse, and depression who presents to the [facility] from behavioral health urgent care [facility]) for evaluation and management of polysubstance abuse with primary concern for alcohol withdrawal.  Subjective: Patient says he is ready to go. He says he got mad last night because he wasn't given pain meds but feels better now. Patient denies having suicidal or homicidal thoughts. He is not hearing any voices others don't hear or seeing things others don't see. He denies experiencing any withdrawal symptoms.  Stay Summary: During the patient's hospitalization, patient had extensive initial psychiatric evaluation, and follow-up psychiatric evaluations every day.  The following meds were provided and managed during his stay. Scheduled Meds:  amLODipine  5 mg Oral Daily   doxepin  10 mg Oral QHS   DULoxetine  60 mg Oral Daily   gabapentin  600 mg Oral TID   gemfibrozil  600 mg Oral BID AC   glipiZIDE  5 mg Oral QAC breakfast   insulin aspart  0-5 Units Subcutaneous QHS   insulin aspart  0-6 Units Subcutaneous TID WC   insulin glargine-yfgn  10 Units Subcutaneous QHS   lisinopril  10 mg Oral Daily   meloxicam  15 mg Oral Daily   metFORMIN  500 mg Oral BID WC   mirtazapine  15 mg Oral QHS   pantoprazole  40 mg Oral Daily   tamsulosin  0.4 mg Oral Daily   Continuous Infusions: PRN Meds:.acetaminophen, alum & mag hydroxide-simeth, bismuth subsalicylate, haloperidol **AND** LORazepam **AND** diphenhydrAMINE, haloperidol lactate **AND** LORazepam **AND** diphenhydrAMINE, hydrOXYzine, methocarbamol, ondansetron, polyethylene glycol, senna  Patient's care was discussed during the  interdisciplinary team meeting every day during the hospitalization.  The patient denies any side effects to prescribed psychiatric medication.  Gradually, patient started adjusting to milieu. The patient was evaluated each day by a clinical provider to ascertain response to treatment. Improvement was noted by the patient's report of decreasing symptoms, improved sleep and appetite, affect, medication tolerance, behavior, and participation in unit programming.  Patient was asked each day to complete a self inventory noting mood, mental status, pain, new symptoms, anxiety and concerns.   Symptoms were reported as significantly decreased or resolved completely by discharge.  The patient reports that their mood is stable.  The patient denied having suicidal thoughts for more than 48 hours prior to discharge.  Patient denies having homicidal thoughts.  Patient denies having auditory hallucinations.  Patient denies any visual hallucinations or other symptoms of psychosis.  The patient was motivated to continue taking medication with a goal of continued improvement in mental health.   Symptoms were reported as significantly decreased or resolved completely by discharge.   On day of discharge, the patient reports that their mood is stable. The patient denied having suicidal thoughts for more than 48 hours prior to discharge.  Patient denies having homicidal thoughts.  Patient denies having auditory hallucinations.  Patient denies any visual hallucinations or other symptoms of psychosis. The patient was motivated to continue taking medication with a goal of continued improvement in mental health.   The patient reports their target psychiatric symptoms of substance withdrawals responded well to the psychiatric medications, and the patient reports overall benefit other psychiatric hospitalization.  Supportive psychotherapy was provided to the patient. The patient also participated in regular group therapy while  hospitalized. Coping skills, problem solving as well as relaxation therapies were also part of the unit programming.  Labs were reviewed with the patient, and abnormal results were discussed with the patient.  The patient is able to verbalize their individual safety plan to this provider.  # It is recommended to the patient to continue psychiatric medications as prescribed, after discharge from the hospital.    # It is recommended to the patient to follow up with your outpatient psychiatric provider and PCP.  # It was discussed with the patient, the impact of alcohol, drugs, tobacco have been there overall psychiatric and medical wellbeing, and total abstinence from substance use was recommended the patient.ed.  # Prescriptions provided or sent directly to preferred pharmacy at discharge. Patient agreeable to plan. Given opportunity to ask questions. Appears to feel comfortable with discharge.    # In the event of worsening symptoms, the patient is instructed to call the crisis hotline, 911 and or go to the nearest ED for appropriate evaluation and treatment of symptoms. To follow-up with primary care provider for other medical issues, concerns and or health care needs  # Patient was discharged  to [facility] (residential rehab facility in [place]) with plans to follow up provided in the discharge instructions.  On day of discharge patient is not suicidal or homicidal. He is not exhibiting bizarre behavior nor endorsing auditory or visual hallucinations. He is not exhibiting any life-threatening substance withdrawal symptoms. At present, there are no indications to hold patient involuntarily.  Total Time spent with patient: 45 minutes  Previous psychiatric diagnoses: Bipolar disorder, depression, polysubstance abuse   Past Medical History: Hypertriglyceridemia, GERD, HTN, chronic pain syndrome, BPH, degenerative disc disease, type 2 diabetes mellitus Allergies: Bee venom and  Penicillins Family History: not discussed at this time  Social History: Homeless and unemployed (disability since 2013 for back pain). Single with 1 child. Previously lived in [place], [place]. Family in [place].  Substance Use History:  Tobacco: uses 1 pack of cigarettes over a 3 day period  Alcohol: daily (1 pint of liquor and 12 pack of beer) Marijuana: daily  Cocaine: 0.5 g daily  Opioids: Heroin (occasional, 2 times in the past month) Rehab: [facility], [facility] Tobacco Cessation:  N/A, patient does not currently use tobacco products  Current Medications:  Current Facility-Administered Medications  Medication Dose Route Frequency Provider Last Rate Last Admin   acetaminophen (TYLENOL) tablet 650 mg  650 mg Oral Q6H PRN Augusto Gamble, MD   650 mg at 08/19/23 0526   alum & mag hydroxide-simeth (MAALOX/MYLANTA) 200-200-20 MG/5ML suspension 30 mL  30 mL Oral Q4H PRN Augusto Gamble, MD   30 mL at 08/18/23 1938   amLODipine (NORVASC) tablet 5 mg  5 mg Oral Daily Augusto Gamble, MD   5 mg at 08/19/23 0759   bismuth subsalicylate (PEPTO BISMOL) chewable tablet 524 mg  524 mg Oral Q3H PRN Augusto Gamble, MD       haloperidol (HALDOL) tablet 5 mg  5 mg Oral Q6H PRN Augusto Gamble, MD       And   LORazepam (ATIVAN) tablet 1 mg  1 mg Oral Q6H PRN Augusto Gamble, MD       And   diphenhydrAMINE (BENADRYL) capsule 25 mg  25 mg Oral Q6H PRN Augusto Gamble, MD       haloperidol lactate (HALDOL) injection 5 mg  5  mg Intramuscular Q6H PRN Augusto Gamble, MD       And   LORazepam (ATIVAN) injection 1 mg  1 mg Intravenous Q6H PRN Augusto Gamble, MD       And   diphenhydrAMINE (BENADRYL) injection 25 mg  25 mg Intramuscular Q6H PRN Augusto Gamble, MD       doxepin (SINEQUAN) capsule 10 mg  10 mg Oral Rosanne Sack, MD   10 mg at 08/18/23 2130   DULoxetine (CYMBALTA) DR capsule 60 mg  60 mg Oral Daily Augusto Gamble, MD   60 mg at 08/19/23 0758   gabapentin (NEURONTIN) capsule 600 mg  600 mg Oral TID Augusto Gamble, MD    600 mg at 08/19/23 0759   gemfibrozil (LOPID) tablet 600 mg  600 mg Oral BID Venetia Maxon, MD   600 mg at 08/19/23 0758   glipiZIDE (GLUCOTROL) tablet 5 mg  5 mg Oral QAC breakfast Augusto Gamble, MD   5 mg at 08/19/23 7829   hydrOXYzine (ATARAX) tablet 25 mg  25 mg Oral TID PRN Augusto Gamble, MD       insulin aspart (novoLOG) injection 0-5 Units  0-5 Units Subcutaneous Rosanne Sack, MD   2 Units at 08/18/23 2131   insulin aspart (novoLOG) injection 0-6 Units  0-6 Units Subcutaneous TID [facility] Augusto Gamble, MD   3 Units at 08/19/23 0806   insulin glargine-yfgn (SEMGLEE) injection 10 Units  10 Units Subcutaneous Rosanne Sack, MD   10 Units at 08/18/23 2130   lisinopril (ZESTRIL) tablet 10 mg  10 mg Oral Daily Augusto Gamble, MD   10 mg at 08/19/23 0758   meloxicam (MOBIC) tablet 15 mg  15 mg Oral Daily Augusto Gamble, MD   15 mg at 08/19/23 0759   metFORMIN (GLUCOPHAGE) tablet 500 mg  500 mg Oral BID WC Augusto Gamble, MD   500 mg at 08/19/23 5621   methocarbamol (ROBAXIN) tablet 500 mg  500 mg Oral BID PRN Augusto Gamble, MD   500 mg at 08/18/23 2137   mirtazapine (REMERON) tablet 15 mg  15 mg Oral Rosanne Sack, MD   15 mg at 08/18/23 2130   ondansetron (ZOFRAN-ODT) disintegrating tablet 8 mg  8 mg Oral Q8H PRN Augusto Gamble, MD       pantoprazole (PROTONIX) EC tablet 40 mg  40 mg Oral Daily Augusto Gamble, MD   40 mg at 08/19/23 0758   polyethylene glycol (MIRALAX / GLYCOLAX) packet 17 g  17 g Oral Daily PRN Augusto Gamble, MD       senna [facility]) tablet 8.6 mg  1 tablet Oral QHS PRN Augusto Gamble, MD       tamsulosin [facility]) capsule 0.4 mg  0.4 mg Oral Daily Augusto Gamble, MD   0.4 mg at 08/19/23 0801   Current Outpatient Medications  Medication Sig Dispense Refill   amLODipine (NORVASC) 5 MG tablet Take 1 tablet (5 mg total) by mouth daily. 30 tablet 0   doxepin (SINEQUAN) 10 MG capsule Take 1 capsule (10 mg total) by mouth at bedtime. 30 capsule 0   DULoxetine (CYMBALTA) 60 MG capsule Take 1 capsule (60 mg  total) by mouth daily. 30 capsule 0   gabapentin (NEURONTIN) 300 MG capsule Take 2 capsules (600 mg total) by mouth 3 (three) times daily. 180 capsule 0   gemfibrozil (LOPID) 600 MG tablet Take 1 tablet (600 mg total) by mouth 2 (two) times daily before a meal. 60 tablet 0  glipiZIDE (GLUCOTROL) 5 MG tablet Take 1 tablet (5 mg total) by mouth daily. 30 tablet 0   insulin aspart (NOVOLOG) 100 UNIT/ML injection Inject 0-5 Units into the skin at bedtime. 1.5 mL 0   insulin aspart (NOVOLOG) 100 UNIT/ML injection Inject 0-6 Units into the skin 3 (three) times daily with meals. 5.4 mL 0   insulin glargine-yfgn (SEMGLEE) 100 UNIT/ML injection Inject 0.1 mLs (10 Units total) into the skin at bedtime. 3 mL 0   lisinopril (ZESTRIL) 10 MG tablet Take 1 tablet (10 mg total) by mouth daily. 30 tablet 0   meloxicam (MOBIC) 15 MG tablet Take 15 mg by mouth daily.     metFORMIN (GLUCOPHAGE) 500 MG tablet Take 1 tablet (500 mg total) by mouth 2 (two) times daily with a meal. 60 tablet 0   methocarbamol (ROBAXIN) 500 MG tablet Take 1 tablet (500 mg total) by mouth 2 (two) times daily as needed for muscle spasms. 30 tablet 0   mirtazapine (REMERON) 15 MG tablet Take 1 tablet (15 mg total) by mouth at bedtime. 30 tablet 0   pantoprazole (PROTONIX) 40 MG tablet Take 1 tablet (40 mg total) by mouth daily. 30 tablet 0   tamsulosin (FLOMAX) 0.4 MG CAPS capsule Take 1 capsule (0.4 mg total) by mouth daily. 30 capsule 0    PTA Medications: (Not in a hospital admission)      08/19/2023    8:17 AM 08/19/2023    7:41 AM 08/18/2023    2:15 PM  Depression screen PHQ 2/9  Decreased Interest 0 0 0  Down, Depressed, Hopeless 0 0 0  PHQ - 2 Score 0 0 0    Flowsheet Row ED from 08/18/2023 in [facility] Department at [facility] ED from 08/11/2023 in [facility] from 07/09/2023 in [facility] at [facility] RISK CATEGORY No Risk No Risk No Risk        Musculoskeletal  Strength & Muscle Tone: within normal limits Gait & Station: normal Patient leans: N/A  Psychiatric Specialty Exam  Presentation General Appearance: Casual   Eye Contact:Good   Speech:Clear and Coherent; Normal Rate   Speech Volume:Normal   Handedness:Right   Mood and Affect  Mood:-- (Fine)   Affect:Appropriate; Restricted   Thought Process  Thought Processes:Coherent; Linear; Goal Directed   Descriptions of Associations:Intact   Orientation:Full (Time, Place and Person)   Thought Content:Logical; WDL   Diagnosis of Schizophrenia or Schizoaffective disorder in past: No data recorded   Hallucinations:Hallucinations: None   Ideas of Reference:None   Suicidal Thoughts:Suicidal Thoughts: No   Homicidal Thoughts:Homicidal Thoughts: No   Sensorium  Memory:Immediate Good; Recent Good; Remote Good   Judgment:Fair   Insight:Poor   Executive Functions  Concentration:Good   Attention Span:Good   Recall:Good   Fund of Knowledge:Good   Language:Good   Psychomotor Activity  Psychomotor Activity:Psychomotor Activity: Normal   Assets  Assets:Resilience   Sleep  Sleep:Sleep: Good   No data recorded  Physical Exam  Physical Exam Vitals and nursing note reviewed.  HENT:     Head: Normocephalic and atraumatic.  Pulmonary:     Effort: Pulmonary effort is normal.  Musculoskeletal:     Cervical back: Normal range of motion.  Neurological:     General: No focal deficit present.     Mental Status: He is alert. Mental status is at baseline.    Review of Systems  Constitutional: Negative.   Respiratory: Negative.  Cardiovascular: Negative.   Gastrointestinal: Negative.   Genitourinary: Negative.   Psychiatric/Behavioral:         Psychiatric subjective data addressed in PSE or HPI / daily subjective report   Blood pressure (!) 136/97, pulse 83, temperature 98.4 F (36.9 C), temperature source Oral,  resp. rate 16, SpO2 96%. There is no height or weight on file to calculate BMI.  Demographic Factors:  Male, Low socioeconomic status, and Unemployed  Loss Factors: Financial problems/change in socioeconomic status  Historical Factors: NA  Risk Reduction Factors:   NA  Continued Clinical Symptoms:  Alcohol/Substance Abuse/Dependencies More than one psychiatric diagnosis  Cognitive Features That Contribute To Risk:  Thought constriction (tunnel vision)    Suicide Risk:  Mild:  Suicidal ideation of limited frequency, intensity, duration, and specificity.  There are no identifiable plans, no associated intent, mild dysphoria and related symptoms, good self-control (both objective and subjective assessment), few other risk factors, and identifiable protective factors, including available and accessible social support.  Plan Of Care/Follow-up recommendations:  Activity: as tolerated  Diet: heart healthy  Other: -Follow-up with your outpatient psychiatric provider -instructions on appointment date, time, and address (location) are provided to you in discharge paperwork.  -Take your psychiatric medications as prescribed at discharge - instructions are provided to you in the discharge paperwork  -Follow-up with outpatient primary care doctor and other specialists -for management of chronic medical disease, including: health maintenance checks and diabetes, hyponatremia  -Testing: Follow-up with outpatient provider for abnormal lab results: chronic hyponatremia, elevated blood sugars  -Recommend abstinence from alcohol, tobacco, and other illicit drug use at discharge.   -If your psychiatric symptoms recur, worsen, or if you have side effects to your psychiatric medications, call your outpatient psychiatric provider, 911, [phone] or go to the nearest emergency department.  -If suicidal thoughts recur, call your outpatient psychiatric provider, 911, [phone] or go to the nearest emergency  department.  Disposition:  Magazine features editor (residential rehab facility in [place])  Augusto Gamble, MD 08/19/2023, 8:17 AM

## 2023-08-18 NOTE — ED Provider Notes (Signed)
RN notified this Clinical research associate of glucose 416. Advised to give 6 units and repeat glucose in 15 minutes and notify provider of repeat reading.

## 2023-08-18 NOTE — ED Notes (Signed)
Pt was provided dinner.

## 2023-08-18 NOTE — ED Notes (Addendum)
Woke up asking for Robaxin. Explained to patient, he cannot have it at this time.  Vistaril 25 mg given instead. Will continue to monitor for safety.

## 2023-08-18 NOTE — ED Notes (Signed)
Pt was provided breakfast.

## 2023-08-18 NOTE — ED Notes (Signed)
Report given at this time to [facility] adult side

## 2023-08-18 NOTE — ED Notes (Signed)
Patient in the bedroom sleeping at the moment. NAD. Respirations are even and unlabored. Will continue to monitor for safety.

## 2023-08-18 NOTE — Discharge Instructions (Addendum)
While in the emergency department, Mr. Crusan received subcu fast acting and long-acting insulin.  His blood sugar at time of discharge was 240, with long-acting insulin on board.  He should have his usual regularly scheduled blood sugar checks.  Here is a reasonable insulin regiment for Mr. Lamorte:  Insulin NovoLog 0-8 units at mealtime  Insulin glargine 10 units at bedtime.  The long-acting insulin can be increased to 5 units at a time and reassessed depending on how well it is working for him.

## 2023-08-18 NOTE — Progress Notes (Signed)
Report given Jamie [facility] CN.

## 2023-08-18 NOTE — ED Provider Notes (Signed)
FBC/OBS ASAP Discharge Summary  Date: 08/18/23 Name: [name] [name]. MRN: [id number]  Discharge Diagnoses:  Final diagnoses:  Alcohol use disorder  Substance induced mood disorder (HCC)   [name] [name]. is a 62 y.o., male with a past psychiatric history of bipolar disorder, polysubstance abuse, and depression  who presents to the Facility Based Crisis center from behavioral health urgent care [facility]) for evaluation and management of polysubstance abuse with primary concern for alcohol withdrawal.   Subjective: Patient is open to going to ED to get his blood sugars under better control. Patient denies having suicidal or homicidal thoughts. He is not hearing any voices others don't hear or seeing things others don't see. He denies experiencing any withdrawal symptoms.  Stay Summary: During the patient's hospitalization, patient had extensive initial psychiatric evaluation, and follow-up psychiatric evaluations every day.  The following meds were provided and managed during his stay. Scheduled Meds:  amLODipine  5 mg Oral Daily   doxepin  10 mg Oral QHS   DULoxetine  60 mg Oral Daily   gabapentin  600 mg Oral TID   gemfibrozil  600 mg Oral BID AC   glipiZIDE  5 mg Oral Daily   insulin aspart  0-5 Units Subcutaneous QHS   insulin aspart  0-6 Units Subcutaneous TID WC   insulin glargine-yfgn  20 Units Subcutaneous QHS   lidocaine  1 patch Transdermal Q24H   lisinopril  10 mg Oral Daily   meloxicam  15 mg Oral Daily   metFORMIN  500 mg Oral BID WC   methocarbamol  750 mg Oral TID   mirtazapine  15 mg Oral QHS   thiamine  100 mg Oral Daily   And   multivitamin with minerals  1 tablet Oral Daily   pantoprazole  40 mg Oral Daily   tamsulosin  0.4 mg Oral Daily   Continuous Infusions: PRN Meds:.acetaminophen, alum & mag hydroxide-simeth, bismuth subsalicylate, fluticasone, hydrOXYzine, methocarbamol, naproxen, polyethylene glycol, senna  Patient's care was discussed during the  interdisciplinary team meeting every day during the hospitalization.  The patient denies any side effects to prescribed psychiatric medication.  Gradually, patient started adjusting to milieu. The patient was evaluated each day by a clinical provider to ascertain response to treatment. Improvement was noted by the patient's report of decreasing symptoms, improved sleep and appetite, affect, medication tolerance, behavior, and participation in unit programming.  Patient was asked each day to complete a self inventory noting mood, mental status, pain, new symptoms, anxiety and concerns.   Symptoms were reported as significantly decreased or resolved completely by discharge.  The patient reports that their mood is stable.  The patient denied having suicidal thoughts for more than 48 hours prior to discharge.  Patient denies having homicidal thoughts.  Patient denies having auditory hallucinations.  Patient denies any visual hallucinations or other symptoms of psychosis.  The patient was motivated to continue taking medication with a goal of continued improvement in mental health.   Symptoms were reported as significantly decreased or resolved completely by discharge.   On day of discharge, the patient reports that their mood is stable. The patient denied having suicidal thoughts for more than 48 hours prior to discharge.  Patient denies having homicidal thoughts.  Patient denies having auditory hallucinations.  Patient denies any visual hallucinations or other symptoms of psychosis. The patient was motivated to continue taking medication with a goal of continued improvement in mental health.   The patient reports their target psychiatric symptoms of  alcohol withdrawals responded well to the psychiatric medications, and the patient reports overall benefit other psychiatric hospitalization. Supportive psychotherapy was provided to the patient. The patient also participated in regular group therapy while  hospitalized. Coping skills, problem solving as well as relaxation therapies were also part of the unit programming.  Labs were reviewed with the patient, and abnormal results were discussed with the patient.  The patient is able to verbalize their individual safety plan to this provider.  # It is recommended to the patient to continue psychiatric medications as prescribed, after discharge from the hospital.    # It is recommended to the patient to follow up with your outpatient psychiatric provider and PCP.  # It was discussed with the patient, the impact of alcohol, drugs, tobacco have been there overall psychiatric and medical wellbeing, and total abstinence from substance use was recommended the patient.ed.  # Prescriptions provided or sent directly to preferred pharmacy at discharge. Patient agreeable to plan. Given opportunity to ask questions. Appears to feel comfortable with discharge.    # In the event of worsening symptoms, the patient is instructed to call the crisis hotline, 911 and or go to the nearest ED for appropriate evaluation and treatment of symptoms. To follow-up with primary care provider for other medical issues, concerns and or health care needs  # Patient was discharged  [facility]  with plans to follow up provided in the discharge instructions.  On day of discharge patient is not suicidal or homicidal. He is not exhibiting bizarre behavior nor endorsing auditory or visual hallucinations. He is not exhibiting any life-threatening substance withdrawal symptoms. At present, there are no indications to hold patient involuntarily.  Total Time spent with patient: 45 minutes  Past Medical History: Hypertriglyceridemia, GERD, HTN, chronic pain syndrome, BPH, degenerative disc disease, type 2 diabetes mellitus Allergies: Bee venom and Penicillins Family History: not discussed at this time  Social History: Homeless and unemployed (disability since 2013 for back pain).  Single with 1 child. Previously lived in [place], [place]. Family in [place].  Substance Use History:  Tobacco: uses 1 pack of cigarettes over a 3 day period  Alcohol: daily (1 pint of liquor and 12 pack of beer) Marijuana: daily  Cocaine: 0.5 g daily  Opioids: Heroin (occasional, 2 times in the past month) Rehab: [facility], [facility] Tobacco Cessation:  N/A, patient does not currently use tobacco products  Current Medications:  Current Facility-Administered Medications  Medication Dose Route Frequency Provider Last Rate Last Admin   acetaminophen (TYLENOL) tablet 650 mg  650 mg Oral Q6H PRN Augusto Gamble, MD   650 mg at 08/15/23 0448   alum & mag hydroxide-simeth (MAALOX/MYLANTA) 200-200-20 MG/5ML suspension 30 mL  30 mL Oral Q4H PRN Augusto Gamble, MD       amLODipine (NORVASC) tablet 5 mg  5 mg Oral Daily Rankin, Shuvon B, NP   5 mg at 08/17/23 0918   bismuth subsalicylate (PEPTO BISMOL) chewable tablet 524 mg  524 mg Oral Q3H PRN Augusto Gamble, MD   524 mg at 08/12/23 1601   doxepin (SINEQUAN) capsule 10 mg  10 mg Oral QHS Carlyn Reichert, MD   10 mg at 08/17/23 2118   DULoxetine (CYMBALTA) DR capsule 60 mg  60 mg Oral Daily Rankin, Shuvon B, NP   60 mg at 08/17/23 0917   fluticasone (FLONASE) 50 MCG/ACT nasal spray 1 spray  1 spray Each Nare Daily PRN Rankin, Shuvon B, NP   1 spray at  08/13/23 0828   gabapentin (NEURONTIN) capsule 600 mg  600 mg Oral TID Rankin, Shuvon B, NP   600 mg at 08/17/23 2118   gemfibrozil (LOPID) tablet 600 mg  600 mg Oral BID AC Rankin, Shuvon B, NP   600 mg at 08/18/23 0738   glipiZIDE (GLUCOTROL) tablet 5 mg  5 mg Oral Daily Rankin, Shuvon B, NP   5 mg at 08/17/23 6606   hydrOXYzine (ATARAX) tablet 25 mg  25 mg Oral TID PRN Augusto Gamble, MD   25 mg at 08/18/23 0342   insulin aspart (novoLOG) injection 0-5 Units  0-5 Units Subcutaneous QHS Carlyn Reichert, MD   5 Units at 08/17/23 2224   insulin aspart (novoLOG) injection 0-6 Units  0-6 Units  Subcutaneous TID WC Carlyn Reichert, MD   6 Units at 08/18/23 0758   insulin glargine-yfgn (SEMGLEE) injection 20 Units  20 Units Subcutaneous QHS Carlyn Reichert, MD   20 Units at 08/17/23 2123   lidocaine (LIDODERM) 5 % 1 patch  1 patch Transdermal Q24H Augusto Gamble, MD   1 patch at 08/17/23 1257   lisinopril (ZESTRIL) tablet 10 mg  10 mg Oral Daily Rankin, Shuvon B, NP   10 mg at 08/17/23 0916   meloxicam (MOBIC) tablet 15 mg  15 mg Oral Daily Rankin, Shuvon B, NP   15 mg at 08/17/23 0916   metFORMIN (GLUCOPHAGE) tablet 500 mg  500 mg Oral BID WC Rankin, Shuvon B, NP   500 mg at 08/18/23 3016   methocarbamol (ROBAXIN) tablet 500 mg  500 mg Oral BID PRN Carlyn Reichert, MD   500 mg at 08/17/23 1259   methocarbamol (ROBAXIN) tablet 750 mg  750 mg Oral TID Rankin, Shuvon B, NP   750 mg at 08/17/23 2118   mirtazapine (REMERON) tablet 15 mg  15 mg Oral QHS Rankin, Shuvon B, NP   15 mg at 08/17/23 2118   thiamine (VITAMIN B1) tablet 100 mg  100 mg Oral Daily Augusto Gamble, MD   100 mg at 08/17/23 0109   And   multivitamin with minerals tablet 1 tablet  1 tablet Oral Daily Augusto Gamble, MD   1 tablet at 08/17/23 3235   naproxen (NAPROSYN) tablet 500 mg  500 mg Oral BID PRN Augusto Gamble, MD   500 mg at 08/18/23 0738   pantoprazole (PROTONIX) EC tablet 40 mg  40 mg Oral Daily Rankin, Shuvon B, NP   40 mg at 08/17/23 5732   polyethylene glycol (MIRALAX / GLYCOLAX) packet 17 g  17 g Oral Daily PRN Augusto Gamble, MD       senna Mancel Parsons) tablet 8.6 mg  1 tablet Oral QHS PRN Augusto Gamble, MD       tamsulosin [facility]) capsule 0.4 mg  0.4 mg Oral Daily Rankin, Shuvon B, NP   0.4 mg at 08/17/23 2025   Current Outpatient Medications  Medication Sig Dispense Refill   amLODipine (NORVASC) 5 MG tablet Take 1 tablet (5 mg total) by mouth daily. 30 tablet 0   doxepin (SINEQUAN) 10 MG capsule Take 1 capsule (10 mg total) by mouth at bedtime. 30 capsule 0   DULoxetine (CYMBALTA) 60 MG capsule Take 60 mg by mouth daily.      gabapentin (NEURONTIN) 300 MG capsule Take 600 mg by mouth 3 (three) times daily.     gemfibrozil (LOPID) 600 MG tablet Take 600 mg by mouth 2 (two) times daily before a meal.     glipiZIDE (GLUCOTROL) 5 MG  tablet Take 1 tablet (5 mg total) by mouth daily. 30 tablet 0   insulin aspart (NOVOLOG) 100 UNIT/ML injection Inject 0-5 Units into the skin at bedtime. 10 mL 11   insulin aspart (NOVOLOG) 100 UNIT/ML injection Inject 0-6 Units into the skin 3 (three) times daily with meals. 10 mL 11   insulin glargine (LANTUS) 100 UNIT/ML injection Inject 0.2 mLs (20 Units total) into the skin daily. (Patient taking differently: Inject 2-8 Units into the skin daily as needed. Sliding scale 2-8) 10 mL 11   insulin glargine-yfgn (SEMGLEE) 100 UNIT/ML injection Inject 0.2 mLs (20 Units total) into the skin at bedtime. 10 mL 11   lisinopril (ZESTRIL) 10 MG tablet Take 10 mg by mouth daily.     meloxicam (MOBIC) 15 MG tablet Take 15 mg by mouth daily.     metFORMIN (GLUCOPHAGE) 500 MG tablet Take 2 tablets (1,000 mg total) by mouth 2 (two) times daily with a meal. (Patient taking differently: Take 500 mg by mouth 2 (two) times daily with a meal.) 60 tablet 0   methocarbamol (ROBAXIN) 500 MG tablet Take 1 tablet (500 mg total) by mouth 2 (two) times daily as needed for muscle spasms. 30 tablet 0   mirtazapine (REMERON) 15 MG tablet Take 15 mg by mouth at bedtime.     pantoprazole (PROTONIX) 40 MG tablet Take 1 tablet (40 mg total) by mouth daily. 30 tablet 0   tamsulosin (FLOMAX) 0.4 MG CAPS capsule Take 1 capsule (0.4 mg total) by mouth daily. 30 capsule 0    PTA Medications: (Not in a hospital admission)      08/14/2023    9:29 AM 08/12/2023   12:58 PM 12/31/2020   10:51 AM  Depression screen PHQ 2/9  Decreased Interest 1 0 1  Down, Depressed, Hopeless 0 1 1  PHQ - 2 Score 1 1 2   Altered sleeping   3  Tired, decreased energy   2  Change in appetite   2  Feeling bad or failure about yourself    2   Trouble concentrating   2  Moving slowly or fidgety/restless   2  Suicidal thoughts   1  PHQ-9 Score   16    Flowsheet Row ED from 08/11/2023 in [facility] ED from 07/09/2023 in [facility] at [facility] ED from 07/08/2023 in [facility] Emergency Department at [facility]  C-SSRS RISK CATEGORY No Risk No Risk No Risk       Musculoskeletal  Strength & Muscle Tone: within normal limits Gait & Station: normal Patient leans: N/A  Psychiatric Specialty Exam  Presentation General Appearance: Appropriate for Environment; Well Groomed   Eye Contact:Good   Speech:Clear and Coherent; Normal Rate   Speech Volume:Normal   Handedness:Right   Mood and Affect  Mood:-- ("doing fine")   Affect:Appropriate; Congruent; Restricted   Thought Process  Thought Processes:Coherent; Linear; Goal Directed   Descriptions of Associations:Intact   Orientation:Full (Time, Place and Person)   Thought Content:Logical; WDL   Diagnosis of Schizophrenia or Schizoaffective disorder in past: No data recorded   Hallucinations:No data recorded  Ideas of Reference:None   Suicidal Thoughts:No data recorded  Homicidal Thoughts:No data recorded  Sensorium  Memory:Immediate Good; Recent Good; Remote Good   Judgment:Good   Insight:Good   Executive Functions  Concentration:Good   Attention Span:Good   Recall:Good   Fund of Knowledge:Good   Language:Good   Psychomotor Activity  Psychomotor Activity:No data recorded  Assets  Assets:Resilience   Sleep  Sleep:No data recorded  No data recorded  Physical Exam  Physical Exam Vitals and nursing note reviewed.  HENT:     Head: Normocephalic and atraumatic.  Pulmonary:     Effort: Pulmonary effort is normal.  Musculoskeletal:     Cervical back: Normal range of motion.  Neurological:     General: No focal deficit present.     Mental Status: He is alert. Mental  status is at baseline.    Review of Systems  Constitutional: Negative.   Respiratory: Negative.    Cardiovascular: Negative.   Gastrointestinal: Negative.   Genitourinary: Negative.   Neurological:  Positive for headaches.  Psychiatric/Behavioral:         Psychiatric subjective data addressed in PSE or HPI / daily subjective report   Blood pressure 115/88, pulse 86, temperature 98.4 F (36.9 C), temperature source Oral, resp. rate 17, SpO2 98%. There is no height or weight on file to calculate BMI.  Demographic Factors:  Male and Low socioeconomic status  Loss Factors: Decline in physical health  Historical Factors: NA  Risk Reduction Factors:   NA  Continued Clinical Symptoms:  Alcohol/Substance Abuse/Dependencies More than one psychiatric diagnosis  Cognitive Features That Contribute To Risk:  None    Suicide Risk:  Mild:  Suicidal ideation of limited frequency, intensity, duration, and specificity.  There are no identifiable plans, no associated intent, mild dysphoria and related symptoms, good self-control (both objective and subjective assessment), few other risk factors, and identifiable protective factors, including available and accessible social support.  Plan Of Care/Follow-up recommendations:  Activity: as tolerated  Diet: heart healthy  Other: -Follow-up with your outpatient psychiatric provider -instructions on appointment date, time, and address (location) are provided to you in discharge paperwork.  -Take your psychiatric medications as prescribed at discharge - instructions are provided to you in the discharge paperwork  -Follow-up with outpatient primary care doctor and other specialists -for management of chronic medical disease, including: health maintenance checks and diabetes  -Testing: Follow-up with outpatient provider for abnormal lab results: uncontrolled blood glucose  -Recommend abstinence from alcohol, tobacco, and other illicit drug  use at discharge.   -If your psychiatric symptoms recur, worsen, or if you have side effects to your psychiatric medications, call your outpatient psychiatric provider, 911, [phone] or go to the nearest emergency department.  -If suicidal thoughts recur, call your outpatient psychiatric provider, 911, [phone] or go to the nearest emergency department.  Disposition:  [facility]  Augusto Gamble, MD 08/18/2023, 8:41 AM

## 2023-08-19 DIAGNOSIS — F1090 Alcohol use, unspecified, uncomplicated: Secondary | ICD-10-CM | POA: Diagnosis not present

## 2023-08-19 LAB — GLUCOSE, CAPILLARY
Glucose-Capillary: 284 mg/dL — ABNORMAL HIGH (ref 70–99)
Glucose-Capillary: 256 mg/dL — ABNORMAL HIGH (ref 70–99)

## 2023-08-19 NOTE — ED Notes (Signed)
Patient A&O x 4, ambulatory. Patient discharged in no acute distress. Patient denied SI/HI, A/VH upon discharge. Patient verbalized understanding of all discharge instructions explained by staff. Pt transported to [employer] bus depot via [employer] for transport to [place]. Patient reported mood 10/10.  Pt belongings returned to patient from locker #9 intact. Patient escorted to lobby via staff for transport to destination. Safety maintained.

## 2023-08-19 NOTE — Group Note (Signed)
Group Topic: Communication  Group Date: 08/19/2023 Start Time: 0800 End Time: 0900 Facilitators: Prentice Docker, RN  Department: [facility]  Number of Participants: 3  Group Focus: discharge education Treatment Modality:  Individual Therapy Interventions utilized were patient education Purpose: discharge education  Name: [name] [name]. Date of Birth: May 11, 1961  MR: [id number]    Level of Participation: active Quality of Participation: cooperative Interactions with others: gave feedback Mood/Affect: appropriate Triggers (if applicable): n/a Cognition: insightful Progress: Significant Response: verbalized understanding of discharge instructions explained by staff Plan: referral / recommendations  Patients Problems:  Patient Active Problem List   Diagnosis Date Noted   Opioid use disorder 08/18/2023   Alcohol use disorder 08/16/2023   Alcohol withdrawal (HCC) 08/12/2023   Polysubstance abuse (HCC) 08/11/2023   Substance induced mood disorder (HCC) 01/19/2021   Alcohol use disorder, severe, dependence (HCC) 01/05/2021   Cocaine use disorder, moderate, dependence (HCC) 01/05/2021   Depression 01/05/2021   Type 2 diabetes mellitus with diabetic neuropathy, without long-term current use of insulin (HCC) 09/21/2020   Septic arthritis (HCC) 08/09/2019   S/P right knee arthroscopy 07/29/2019 08/03/2019   Acute lateral meniscus tear of right knee    Acute medial meniscus tear of right knee    Substance use disorder 03/16/2019   History of diabetes mellitus 10/10/2016   Avascular necrosis of hip, left (HCC) 10/04/2016   Status post left hip replacement 10/04/2016   Rash and nonspecific skin eruption 08/12/2013   Cholelithiases 08/04/2013   Subcutaneous nodule 07/21/2013   Acute alcoholic pancreatitis 04/24/2013   Syncope 04/11/2013   Prolonged Q-T interval on ECG 04/11/2013   Stimulant use disorder 03/13/2013    Class: Chronic   At risk for  adverse drug event 02/14/2013   DDD (degenerative disc disease), lumbar 02/01/2013   Dyspepsia 12/01/2012   BPH (benign prostatic hyperplasia) 10/07/2012   Allergic rhinitis 10/03/2012   Transaminitis 10/02/2012   Chronic pain syndrome 10/02/2012   Essential hypertension, benign 07/01/2012   Tobacco user 07/01/2012   Hepatic steatosis 03/06/2012   ED (erectile dysfunction) 03/06/2012   Pseudocyst of pancreas 02/28/2012   Alcohol abuse 02/25/2012   GERD (gastroesophageal reflux disease) 02/23/2012   Bipolar 1 disorder (HCC) 02/23/2012   Hypertriglyceridemia 12/05/2011

## 2023-08-19 NOTE — ED Notes (Signed)
Patient is sleeping. Respirations equal and unlabored, skin warm and dry. No change in assessment or acuity. Routine safety checks conducted according to facility protocol. Will continue to monitor for safety.   

## 2023-08-19 NOTE — ED Notes (Signed)
Patient approached the nursing station at approximately 0330 stating that he needed his pain medication. When it was inquired of patient regarding what pain medication he was referring to, he stated his Robaxin. Patient had requested this medication from the other nurse on duty moments earlier to which it was explained that he would not be able to receive it again till later in the day, as it is BID and he has already had 2 doses. Patient requested further medication from both nurses and Tylenol was offered as it was his PRN order for Pain. Patient had already been escalating, raising his voice, and posturing with nursing prior to this moment however at this time, he became irate and aggressive throwing a cup against the back wall of his room, and slamming the door. He continued to express incoherently behind the now, closed door. Patient was observed moments later when door was open to perform the 15 minute check. Patient has returned to bed but remains labile and agitated lying in bed. Will continue to monitor/support.

## 2023-08-19 NOTE — ED Notes (Signed)
Pt requested for pain medication for headache. Tylenol 650mg  was administered.

## 2023-10-22 ENCOUNTER — Telehealth: Payer: Self-pay | Admitting: Orthopaedic Surgery

## 2023-10-22 NOTE — Telephone Encounter (Signed)
Patient called and wanted to let you know he had surgery in 2017 but hip is bothering him very badly. [phone]

## 2023-11-06 ENCOUNTER — Ambulatory Visit: Payer: 59 | Admitting: Physician Assistant

## 2023-11-29 ENCOUNTER — Emergency Department
Admission: EM | Admit: 2023-11-29 | Discharge: 2023-11-29 | Disposition: A | Discharge status: Home / Self Care | Payer: 59 | Attending: Emergency Medicine | Admitting: Emergency Medicine

## 2023-11-29 ENCOUNTER — Ambulatory Visit (HOSPITAL_COMMUNITY)
Admission: EM | Admit: 2023-11-29 | Discharge: 2023-11-29 | Disposition: A | Discharge status: Home / Self Care | Payer: 59 | Attending: Psychiatry | Admitting: Psychiatry

## 2023-11-29 ENCOUNTER — Other Ambulatory Visit: Payer: Self-pay

## 2023-11-29 DIAGNOSIS — F1910 Other psychoactive substance abuse, uncomplicated: Secondary | ICD-10-CM

## 2023-11-29 DIAGNOSIS — F1010 Alcohol abuse, uncomplicated: Secondary | ICD-10-CM | POA: Insufficient documentation

## 2023-11-29 DIAGNOSIS — F1090 Alcohol use, unspecified, uncomplicated: Secondary | ICD-10-CM | POA: Diagnosis not present

## 2023-11-29 DIAGNOSIS — R454 Irritability and anger: Secondary | ICD-10-CM | POA: Insufficient documentation

## 2023-11-29 DIAGNOSIS — Z5900 Homelessness unspecified: Secondary | ICD-10-CM | POA: Diagnosis not present

## 2023-11-29 DIAGNOSIS — F1410 Cocaine abuse, uncomplicated: Secondary | ICD-10-CM | POA: Diagnosis present

## 2023-11-29 LAB — CBC
HCT: 44.4 % (ref 39.0–52.0)
Hemoglobin: 15.1 g/dL (ref 13.0–17.0)
MCH: 30.1 pg (ref 26.0–34.0)
MCHC: 34 g/dL (ref 30.0–36.0)
MCV: 88.6 fL (ref 80.0–100.0)
Platelets: 306 10*3/uL (ref 150–400)
RBC: 5.01 MIL/uL (ref 4.22–5.81)
RDW: 16 % — ABNORMAL HIGH (ref 11.5–15.5)
WBC: 5.6 10*3/uL (ref 4.0–10.5)
nRBC: 0 % (ref 0.0–0.2)

## 2023-11-29 LAB — COMPREHENSIVE METABOLIC PANEL
ALT: 20 U/L (ref 0–44)
AST: 41 U/L (ref 15–41)
Albumin: 3.9 g/dL (ref 3.5–5.0)
Alkaline Phosphatase: 48 U/L (ref 38–126)
Anion gap: 11 (ref 5–15)
BUN: 7 mg/dL (ref 6–20)
CO2: 27 mmol/L (ref 22–32)
Calcium: 8.8 mg/dL — ABNORMAL LOW (ref 8.9–10.3)
Chloride: 100 mmol/L (ref 98–111)
Creatinine, Ser: 1.21 mg/dL (ref 0.61–1.24)
GFR, Estimated: >60 mL/min (ref >60)
Glucose, Bld: 118 mg/dL — ABNORMAL HIGH (ref 70–99)
Potassium: 3.9 mmol/L (ref 3.5–5.1)
Sodium: 138 mmol/L (ref 135–145)
Total Bilirubin: 0.7 mg/dL (ref <1.2)
Total Protein: 7.6 g/dL (ref 6.5–8.1)

## 2023-11-29 LAB — ACETAMINOPHEN LEVEL: Acetaminophen (Tylenol), Serum: <10 ug/mL — ABNORMAL LOW (ref 10–30)

## 2023-11-29 LAB — ETHANOL: Alcohol, Ethyl (B): <10 mg/dL (ref <10)

## 2023-11-29 LAB — SALICYLATE LEVEL: Salicylate Lvl: <7 mg/dL — ABNORMAL LOW (ref 7.0–30.0)

## 2023-11-29 NOTE — Progress Notes (Signed)
   11/29/23 1034  [facility] Triage Screening (Walk-ins at [facility] only)  How Did You Hear About [place]? Self  What Is the Reason for Your Visit/Call Today? [name] [name] is a 63 y.o. male with a hx of Bipolar Disorder and polysubstance abuse who presents requesting detox. Pt reports he is wanting to detox specifically off of alcohol and cocaine. Pt mentions that he has been using these substances for 30 years. Pt mentions he last used a gram of cocanie yesterday and a 12 pack of beer. He reports he completed a 21 Day program at [facility] months ago. Pt denies any withdraw symptoms at this time. Pt denies Si, HI and AVH.  How Long Has This Been Causing You Problems? > than 6 months  Have You Recently Had Any Thoughts About Hurting Yourself? No  Are You Planning to Commit Suicide/Harm Yourself At This time? No  Have you Recently Had Thoughts About Hurting Someone [name]? No  Are You Planning To Harm Someone At This Time? No  Physical Abuse Denies  Verbal Abuse Denies  Sexual Abuse Denies  Exploitation of patient/patient's resources Denies  Self-Neglect Denies  Possible abuse reported to: Other (Comment)  Are you currently experiencing any auditory, visual or other hallucinations? No  Have You Used Any Alcohol or Drugs in the Past 24 Hours? No  Do you have any current medical co-morbidities that require immediate attention? No  Clinician description of patient physical appearance/behavior: calm, cooperative  What Do You Feel Would Help You the Most Today? Alcohol or Drug Use Treatment  If access to [facility] Urgent Care was not available, would you have sought care in the Emergency Department? No  Determination of Need Routine (7 days)  Options For Referral Intensive Outpatient Therapy;Facility-Based Crisis

## 2023-11-29 NOTE — ED Provider Notes (Signed)
[facility] Provider Note    Event Date/Time   First MD Initiated Contact with Patient 11/29/23 1530     (approximate)   History   Detox   HPI  [name] [name]. is a 62 y.o. male presents to the emergency department today because he wants detox. Patient was seen at [facility] earlier today for the same complaint. Has been using cocaine and alcohol.    Physical Exam   Triage Vital Signs: ED Triage Vitals  Encounter Vitals Group     BP 11/29/23 1513 (!) 108/59     Systolic BP Percentile --      Diastolic BP Percentile --      Pulse Rate 11/29/23 1513 (!) 51     Resp 11/29/23 1513 18     Temp 11/29/23 1513 98.7 F (37.1 C)     Temp Source 11/29/23 1513 Oral     SpO2 11/29/23 1513 99 %     Weight --      Height 11/29/23 1513 5\' 10"  (1.778 m)     Head Circumference --      Peak Flow --      Pain Score 11/29/23 1512 0     Pain Loc --      Pain Education --      Exclude from Growth Chart --     Most recent vital signs: Vitals:   11/29/23 1513  BP: (!) 108/59  Pulse: (!) 51  Resp: 18  Temp: 98.7 F (37.1 C)  SpO2: 99%   General: Awake, alert, oriented. CV:  Good peripheral perfusion.  Resp:  Normal effort.  Abd:  No distention.    ED Results / Procedures / Treatments   Labs (all labs ordered are listed, but only abnormal results are displayed) Labs Reviewed  COMPREHENSIVE METABOLIC PANEL - Abnormal; Notable for the following components:      Result Value   Glucose, Bld 118 (*)    Calcium 8.8 (*)    All other components within normal limits  SALICYLATE LEVEL - Abnormal; Notable for the following components:   Salicylate Lvl <7.0 (*)    All other components within normal limits  ACETAMINOPHEN LEVEL - Abnormal; Notable for the following components:   Acetaminophen (Tylenol), Serum <10 (*)    All other components within normal limits  CBC - Abnormal; Notable for the following components:   RDW 16.0  (*)    All other components within normal limits  ETHANOL  URINE DRUG SCREEN, QUALITATIVE ([facility] ONLY)     EKG  None   RADIOLOGY None   PROCEDURES:  Critical Care performed: No   MEDICATIONS ORDERED IN ED: Medications - No data to display   IMPRESSION / MDM / ASSESSMENT AND PLAN / ED COURSE  I reviewed the triage vital signs and the nursing notes.                              Differential diagnosis includes, but is not limited to, substance abuse, psychiatric illness  Patient's presentation is most consistent with acute presentation with potential threat to life or bodily function.   Patient presents to the emergency department today because of concerns for desire for detox.  Did discuss with patient that we do not do detox here.  Will give patient outpatient resources.  Patient without any SI on my exam.     FINAL CLINICAL IMPRESSION(S) / ED  DIAGNOSES   Final diagnoses:  Substance abuse (HCC)    Note:  This document was prepared using Dragon voice recognition software and may include unintentional dictation errors.    Phineas Semen, MD 11/29/23 [phone]

## 2023-11-29 NOTE — ED Triage Notes (Addendum)
Pt to ed from home in [place] for detox. Pt is caox4, in no acute distress and ambulatory in triage. Pt then states "I am suicidal and need a mental eval". Pt admits to doing cocaine and ETOH. Pt then states "I am homeless" when asked how he got here, "I ubered". Was his answer. Pt has no plans of SI at this time just keeps stating I am homeless.

## 2023-11-29 NOTE — ED Notes (Signed)
[name] [name] reported that he is seeking treatment for substance abuse (Alcohol and Cocaine). TTS provided resource information for substance abuse treatment centers for inpatient and outpatient needs to [name] [name].

## 2023-11-29 NOTE — Discharge Summary (Signed)
[name] [name]. to be D/C'd Home per NP order. Discussed with the patient and all questions fully answered. An After Visit Summary was printed and given to the patient. All belongings returned. Patient escorted out and D/C home via private auto.  [name]  11/29/2023 12:27 PM

## 2023-11-29 NOTE — ED Notes (Signed)
MD at the bedside for pt evaluation  

## 2023-11-29 NOTE — ED Notes (Addendum)
Pt dressed out:  [place] hoodie Black hat White shoes One earring Multipe plastic/rubber bracelets Knee brace Black shirt Grey shirt Tan belt Lighter White socks Cell phone - android screen shattered [employer] boxers  Two giant sterlite containers of belongings.

## 2023-11-29 NOTE — ED Provider Notes (Signed)
Behavioral Health Urgent Care Medical Screening Exam  Patient Name: [name] [name]. MRN: [id number] Date of Evaluation: 11/29/23 Chief Complaint:   Diagnosis:  Final diagnoses:  None    History of Present illness: [name] Merithew. is a 62 y.o. male.   Patient presents to [facility] voluntarily, unaccompanied, reporting that he needs help with his substance use problem. He reports that he is addicted to crack/cocaine and alcohol with a hx of multiple short-term and long-term treatments. He reports dkinking about a 12 pack of beer every day and uses about a gram of crack/cocaine daily. He reports that he has not been able to maintain sobriety exceped when he was incarcerated  2014-2017. No current legal charges. He reports that he has been struggling with substance use for about 30 years. Patient completed a 21 day program at [facility]  a month and relapsed thereafter.    Per chart review:   On 08/01/2023, patient was admitted to [facility] for detox and upon completion, he was discharged to a residential treatment facility.  In July 2024, patient had been treated at [facility]  for detox and was discharged with recommendations for residential programs.  Patient has had many more visits to the hospitals for the same concern. He reports that he never followed up with outpatient services.  He reports that he has been homeless for a while except whenever he goes to a long term treatment program. He reports that he receives disability income but has not been able to find housing.   Assessment: 62 year-old male who appears disheveled. He is guarded upon approach. He is alert and oriented x 4. He is irritable at times, and commending. He has poor eye contact. He does not appear to be preoccupied or responding to internal stimuli. His thought process is coherent. Patient denies suicidal/homicidal thoughts. He denies hallucinations. Patient admits to using about a gram of crack/cocaine every day  in addition to a 12 pack of beer. Lat use was today: patient reports that he used a couple of beers prior to coming here. He denies withdrawal symptoms. Patient states "I need a bed here, can you keep me here and send me to [facility] or [facility]?". While discussing about previous treatments and recommendations. Patient becomes irritable and keeps saying that "I need a bed here first". He denies pain/discomfort. Denies headache/dizziness. Denies muscle/joint  pain. Denies respiratory distress. Denies chest/back/abdominal pain. He reports that his appetite is good.  He reports not having any housing plan. He reports that he is homeless in [place], [place].   Patient does not appear to be in any acute distress. He denies DI/HI/AVH. He denies current medical concerns. Team discussed with him about available  programs in the area  and resources provided. I consulted with Dr Lajoyce Corners and discharge recommended.   Flowsheet Row ED from 11/29/2023 in [facility] ED from 08/18/2023 in [facility] Emergency Department at [facility] ED from 08/11/2023 in [facility]  C-SSRS RISK CATEGORY No Risk No Risk No Risk       Psychiatric Specialty Exam  Presentation  General Appearance:Casual  Eye Contact:Fair  Speech:Clear and Coherent  Speech Volume:Normal  Handedness:Right   Mood and Affect  Mood: Irritable  Affect: Appropriate   Thought Process  Thought Processes: Coherent  Descriptions of Associations:Intact  Orientation:Full (Time, Place and Person)  Thought Content:WDL  Diagnosis of Schizophrenia or Schizoaffective disorder in past: No data recorded  Hallucinations:None  Ideas  of Reference:None  Suicidal Thoughts:No  Homicidal Thoughts:No   Sensorium  Memory: Immediate Fair; Recent Fair; Remote Fair  Judgment: Fair  Insight: Fair   Chartered certified accountant: Fair  Attention  Span: Fair  Recall: [employer] of Knowledge: Fair  Language: Fair   Psychomotor Activity  Psychomotor Activity: Normal   Assets  Assets: Manufacturing systems engineer; Desire for Improvement; Financial Resources/Insurance (has disability income)   Sleep  Sleep: Fair  Number of hours:  5   Physical Exam: Physical Exam Vitals and nursing note reviewed.  Constitutional:      Appearance: Normal appearance.  HENT:     Head: Normocephalic and atraumatic.     Right Ear: Tympanic membrane normal.     Left Ear: Tympanic membrane normal.     Nose: Nose normal.  Eyes:     Extraocular Movements: Extraocular movements intact.     Pupils: Pupils are equal, round, and reactive to light.  Cardiovascular:     Rate and Rhythm: Normal rate.     Pulses: Normal pulses.  Pulmonary:     Effort: Pulmonary effort is normal.  Musculoskeletal:        General: Normal range of motion.     Cervical back: Normal range of motion and neck supple.  Neurological:     General: No focal deficit present.     Mental Status: He is alert and oriented to person, place, and time.  Psychiatric:        Thought Content: Thought content normal.    Review of Systems  Constitutional: Negative.   HENT: Negative.    Eyes: Negative.   Respiratory: Negative.    Cardiovascular: Negative.   Gastrointestinal: Negative.   Genitourinary: Negative.   Musculoskeletal: Negative.   Skin: Negative.   Neurological: Negative.   Endo/Heme/Allergies: Negative.   Psychiatric/Behavioral:  Positive for substance abuse.    Blood pressure 138/78, pulse 62, temperature 97.7 F (36.5 C), temperature source Oral, resp. rate 20, SpO2 99%. There is no height or weight on file to calculate BMI.  Musculoskeletal: Strength & Muscle Tone: within normal limits Gait & Station: normal Patient leans: N/A   [facility] MSE Discharge Disposition for Follow up and Recommendations: Based on my evaluation the patient does not appear to  have an emergency medical condition and can be discharged with resources and follow up care in outpatient services for Medication Management, Substance Abuse Intensive Outpatient Program, Individual Therapy, and Group Therapy   Olin Pia, NP 11/29/2023, 11:58 AM

## 2023-11-29 NOTE — Discharge Instructions (Addendum)

## 2023-12-03 ENCOUNTER — Ambulatory Visit (HOSPITAL_COMMUNITY)
Admission: EM | Admit: 2023-12-03 | Discharge: 2023-12-03 | Disposition: A | Discharge status: Home / Self Care | Payer: 59 | Attending: Nurse Practitioner | Admitting: Nurse Practitioner

## 2023-12-03 DIAGNOSIS — Z5900 Homelessness unspecified: Secondary | ICD-10-CM

## 2023-12-03 DIAGNOSIS — F1090 Alcohol use, unspecified, uncomplicated: Secondary | ICD-10-CM

## 2023-12-03 DIAGNOSIS — F1910 Other psychoactive substance abuse, uncomplicated: Secondary | ICD-10-CM

## 2023-12-03 MED ORDER — AMLODIPINE BESYLATE 5 MG PO TABS: 5.0000 mg | ORAL_TABLET | Freq: Every day | ORAL | 0 refills | Status: AC

## 2023-12-03 MED ORDER — LISINOPRIL 10 MG PO TABS: 10.0000 mg | ORAL_TABLET | Freq: Every day | ORAL | 0 refills | Status: AC

## 2023-12-03 MED ORDER — GEMFIBROZIL 600 MG PO TABS: 600.0000 mg | ORAL_TABLET | Freq: Two times a day (BID) | ORAL | 0 refills | Status: AC

## 2023-12-03 MED ORDER — METFORMIN HCL 500 MG PO TABS: 500.0000 mg | ORAL_TABLET | Freq: Two times a day (BID) | ORAL | 0 refills | Status: DC

## 2023-12-03 MED ORDER — GLIPIZIDE 5 MG PO TABS: 5.0000 mg | ORAL_TABLET | Freq: Every day | ORAL | 0 refills | Status: AC

## 2023-12-03 MED ORDER — MELOXICAM 15 MG PO TABS: 15.0000 mg | ORAL_TABLET | Freq: Every day | ORAL | 0 refills | Status: DC

## 2023-12-03 MED ORDER — DOXEPIN HCL 10 MG PO CAPS: 10.0000 mg | ORAL_CAPSULE | Freq: Every day | ORAL | 0 refills | Status: AC

## 2023-12-03 MED ORDER — TAMSULOSIN HCL 0.4 MG PO CAPS: 0.4000 mg | ORAL_CAPSULE | Freq: Every day | ORAL | 0 refills | Status: AC

## 2023-12-03 MED ORDER — MIRTAZAPINE 15 MG PO TABS: 15.0000 mg | ORAL_TABLET | Freq: Every day | ORAL | 0 refills | Status: AC

## 2023-12-03 MED ORDER — DULOXETINE HCL 60 MG PO CPEP: 60.0000 mg | ORAL_CAPSULE | Freq: Every day | ORAL | 0 refills | Status: AC

## 2023-12-03 MED ORDER — PANTOPRAZOLE SODIUM 40 MG PO TBEC: 40.0000 mg | DELAYED_RELEASE_TABLET | Freq: Every day | ORAL | 0 refills | Status: DC

## 2023-12-03 NOTE — ED Provider Notes (Signed)
Behavioral Health Urgent Care Medical Screening Exam  Patient Name: [name] [name]. MRN: [id number] Date of Evaluation: 12/03/23 Chief Complaint:  I need a place to stay tonight Diagnosis:  Final diagnoses:  Polysubstance abuse (HCC)  Alcohol use disorder    History of Present illness: [name] Lunn. is a 62 y.o. male with a history of substance abuse, alcohol use disorder, suicidal ideation presenting to [facility] voluntarily for prescription refills and wanted a bed for tonight until he goes to substance abuse treatment at [facility] in [place].  Nurse practitioner assessed patient face-to-face and reviewed his chart.  Patient is alert oriented x 4, calm and cooperative, speech is clear and coherent, mood is euthymic with congruent affect, thought process is linear and goal-directed.  Patient denies current SI/HI/AH.    Patient was previously in substance treatment at [facility] in [place] in  August for a month.  Patient reports he has also received treatment in [facility] as well. Patient reports that he was discharged from [facility] in [place] today after being there for 5 days to detox from alcohol and cocaine.  Patient states that he has been accepted to [facility] in [place] on 12/03/24 for substance abuse treatment. Patient reports that previously he using a half a gram of cocaine, half a gram of marijuana, a 12 pack of beer and a pint of liquor daily.  Patient states that when he was discharged from [facility] he was only given four prescriptions and he needs his other prescriptions in order to be admitted to [facility] and he would like to stay here tonight because he does not have any other place to stay.  Patient states that he has family in [place] but feels that he goes there he will relapse and if he has he will not be able to go to [facility].   Consulted with Dr. Clovis Riley and patient does not meet criteria for inpatient  treatment. Patient given information about local shelters that are accepting patients tonight due to extreme cold and was also given a bus pass.  Patient stated that his insurance will pay for his for transportation within a 40 mile radius for him to get to treatment tomorrow.   Flowsheet Row ED from 12/03/2023 in [facility] Most recent reading at 12/03/2023  5:23 PM ED from 11/29/2023 in [facility] Emergency Department at [facility] Most recent reading at 11/29/2023  3:14 PM ED from 11/29/2023 in [facility] Most recent reading at 11/29/2023 10:43 AM  C-SSRS RISK CATEGORY Low Risk No Risk No Risk       Psychiatric Specialty Exam  Presentation  General Appearance:Disheveled  Eye Contact:Good  Speech:Clear and Coherent  Speech Volume:Normal  Handedness:Right   Mood and Affect  Mood: Euthymic  Affect: Appropriate   Thought Process  Thought Processes: Coherent  Descriptions of Associations:Intact  Orientation:Full (Time, Place and Person)  Thought Content:WDL  Diagnosis of Schizophrenia or Schizoaffective disorder in past: No data recorded  Hallucinations:None  Ideas of Reference:None  Suicidal Thoughts:No  Homicidal Thoughts:No   Sensorium  Memory: Immediate Good; Recent Good; Remote Good  Judgment: Fair  Insight: Fair   Art therapist  Concentration: Good  Attention Span: Good  Recall: Good  Fund of Knowledge: Good  Language: Good   Psychomotor Activity  Psychomotor Activity: Normal   Assets  Assets: Communication Skills; Desire for Improvement; Physical Health; Resilience   Sleep  Sleep: Fair  Number  of hours:  5   Physical Exam: Physical Exam HENT:     Head: Normocephalic.  Eyes:     Pupils: Pupils are equal, round, and reactive to light.  Cardiovascular:     Rate and Rhythm: Normal rate.  Abdominal:     General: Abdomen is flat.   Musculoskeletal:        General: Normal range of motion.  Skin:    General: Skin is warm.  Neurological:     Mental Status: He is alert and oriented to person, place, and time.  Psychiatric:        Attention and Perception: Attention normal.        Mood and Affect: Mood normal.        Speech: Speech normal.        Behavior: Behavior normal.        Thought Content: Thought content normal.        Cognition and Memory: Cognition normal.        Judgment: Judgment is impulsive.    Review of Systems  Constitutional: Negative.   HENT: Negative.    Eyes: Negative.   Respiratory: Negative.    Cardiovascular: Negative.   Gastrointestinal: Negative.   Genitourinary: Negative.   Musculoskeletal: Negative.   Skin: Negative.   Neurological: Negative.   Endo/Heme/Allergies: Negative.   Psychiatric/Behavioral:  Positive for substance abuse.    Blood pressure (!) 161/86, pulse (!) 53, temperature 98.7 F (37.1 C), temperature source Oral, resp. rate 18, SpO2 97%. There is no height or weight on file to calculate BMI.  Musculoskeletal: Strength & Muscle Tone: within normal limits Gait & Station: normal Patient leans: N/A   [facility] MSE Discharge Disposition for Follow up and Recommendations: Based on my evaluation the patient does not appear to have an emergency medical condition and can be discharged with resources and follow up care in outpatient services for Medication Management and Individual Therapy   Jasper Riling, NP 12/03/2023, 6:12 PM

## 2023-12-03 NOTE — Progress Notes (Signed)
   12/03/23 1700  [facility] Triage Screening (Walk-ins at [facility] only)  How Did You Hear About [place]? Self  What Is the Reason for Your Visit/Call Today? Pt presents to [facility] voluntarily unaccompanied. Pt states that he needs to be admitted until he can get to [place] in [place] tomorrow @ 12pm. Pt states that he needs a 30 day supply of medication also. Pt states that he just left [facility] today and was given 4 prescriptions but he states that he should have 12. Pt states that he is currently SI without a plan. Pt states that he wants to be admitted so that he doesn't use or hurt anyone. Pt denies HI, AVH and drug use at this time. Pt states that he had a beer today.  How Long Has This Been Causing You Problems? <Week  Have You Recently Had Any Thoughts About Hurting Yourself? Yes  How long ago did you have thoughts about hurting yourself? today - no plan  Are You Planning to Commit Suicide/Harm Yourself At This time? Yes  Have you Recently Had Thoughts About Hurting Someone [name]? No  Are You Planning To Harm Someone At This Time? No  Physical Abuse Denies  Verbal Abuse Denies  Sexual Abuse Denies  Exploitation of patient/patient's resources Denies  Self-Neglect Denies  Are you currently experiencing any auditory, visual or other hallucinations? No  Have You Used Any Alcohol or Drugs in the Past 24 Hours? Yes  How long ago did you use Drugs or Alcohol? today  What Did You Use and How Much? beer - a can  Do you have any current medical co-morbidities that require immediate attention? No  Clinician description of patient physical appearance/behavior: calm, cooperative  What Do You Feel Would Help You the Most Today? Social Support  If access to [facility] was not available, would you have sought care in the Emergency Department? No  Determination of Need Routine (7 days)  Options For Referral Medication Management;Outpatient Therapy

## 2023-12-03 NOTE — Discharge Instructions (Addendum)
  Discharge recommendations:  Patient is to take medications as prescribed. Please see information for follow-up appointment with psychiatry and therapy. Please follow up with your primary care provider for all medical related needs.  Patient will follow up with [facility] at [address] [place], [place] [address] on 12/04/23 for substance abuse treatment.  [phone] [employer], located at [address] will offer overnight shelter for men and women from 8 p.m. to 8 a.m Thursday morning   Therapy: We recommend that patient participate in individual therapy to address mental health concerns.  Medications: The patient or guardian is to contact a medical professional and/or outpatient provider to address any new side effects that develop. The patient or guardian should update outpatient providers of any new medications and/or medication changes.   Atypical antipsychotics: If you are prescribed an atypical antipsychotic, it is recommended that your height, weight, BMI, blood pressure, fasting lipid panel, and fasting blood sugar be monitored by your outpatient providers.  Safety:  The patient should abstain from use of illicit substances/drugs and abuse of any medications. If symptoms worsen or do not continue to improve or if the patient becomes actively suicidal or homicidal then it is recommended that the patient return to the closest hospital emergency department, the [facility], or call 911 for further evaluation and treatment. National Suicide Prevention Lifeline 1-800-SUICIDE or 6783899724.  About 988 988 offers 24/7 access to trained crisis counselors who can help people experiencing mental health-related distress. People can call or text 988 or chat 988lifeline.org for themselves or if they are worried about a loved one who may need crisis support.  Crisis Mobile: [facility]:                     (856)134-9128  (for crisis response 24 hours a day) [facility] Hotline:                                            239-644-2258

## 2023-12-21 ENCOUNTER — Emergency Department (HOSPITAL_COMMUNITY)
Admission: EM | Admit: 2023-12-21 | Discharge: 2023-12-22 | Disposition: A | Discharge status: Home / Self Care | Payer: 59 | Attending: Emergency Medicine | Admitting: Emergency Medicine

## 2023-12-21 ENCOUNTER — Encounter (HOSPITAL_COMMUNITY): Payer: Self-pay | Admitting: Emergency Medicine

## 2023-12-21 ENCOUNTER — Other Ambulatory Visit: Payer: Self-pay

## 2023-12-21 DIAGNOSIS — M25552 Pain in left hip: Secondary | ICD-10-CM | POA: Diagnosis present

## 2023-12-21 DIAGNOSIS — M5432 Sciatica, left side: Secondary | ICD-10-CM | POA: Insufficient documentation

## 2023-12-21 DIAGNOSIS — Z96642 Presence of left artificial hip joint: Secondary | ICD-10-CM | POA: Diagnosis not present

## 2023-12-21 DIAGNOSIS — Z794 Long term (current) use of insulin: Secondary | ICD-10-CM | POA: Insufficient documentation

## 2023-12-21 NOTE — ED Triage Notes (Addendum)
Pt with c/o chronic L hip pain. States he takes Neurontin and Robaxin for pain but that "it isn't helping anymore". Pt ambulatory to triage.

## 2023-12-22 ENCOUNTER — Other Ambulatory Visit: Payer: Self-pay

## 2023-12-22 ENCOUNTER — Emergency Department (HOSPITAL_COMMUNITY)
Admission: EM | Admit: 2023-12-22 | Discharge: 2023-12-22 | Disposition: A | Discharge status: Home / Self Care | Payer: 59 | Source: Home / Self Care | Attending: Emergency Medicine | Admitting: Emergency Medicine

## 2023-12-22 ENCOUNTER — Emergency Department (HOSPITAL_COMMUNITY): Payer: 59

## 2023-12-22 DIAGNOSIS — Z79899 Other long term (current) drug therapy: Secondary | ICD-10-CM | POA: Insufficient documentation

## 2023-12-22 DIAGNOSIS — I10 Essential (primary) hypertension: Secondary | ICD-10-CM | POA: Insufficient documentation

## 2023-12-22 DIAGNOSIS — M25552 Pain in left hip: Secondary | ICD-10-CM | POA: Insufficient documentation

## 2023-12-22 DIAGNOSIS — Z96642 Presence of left artificial hip joint: Secondary | ICD-10-CM | POA: Insufficient documentation

## 2023-12-22 DIAGNOSIS — Z760 Encounter for issue of repeat prescription: Secondary | ICD-10-CM | POA: Insufficient documentation

## 2023-12-22 MED ORDER — MELOXICAM 7.5 MG PO TABS
15.0000 mg | ORAL_TABLET | Freq: Once | ORAL | Status: AC
  Administered 2023-12-22: 15 mg via ORAL
  Filled 2023-12-22: qty 2

## 2023-12-22 MED ORDER — KETOROLAC TROMETHAMINE 60 MG/2ML IM SOLN
60.0000 mg | Freq: Once | INTRAMUSCULAR | Status: AC
Start: 2023-12-22 — End: 2023-12-22
  Administered 2023-12-22: 60 mg via INTRAMUSCULAR
  Filled 2023-12-22: qty 2

## 2023-12-22 MED ORDER — GABAPENTIN 600 MG PO TABS
600.0000 mg | ORAL_TABLET | Freq: Three times a day (TID) | ORAL | 0 refills | Status: AC
Start: 2023-12-22 — End: ?

## 2023-12-22 MED ORDER — METFORMIN HCL 500 MG PO TABS: 500.0000 mg | ORAL_TABLET | Freq: Two times a day (BID) | ORAL | 0 refills | Status: AC

## 2023-12-22 MED ORDER — OMEPRAZOLE 40 MG PO CPDR: 40.0000 mg | DELAYED_RELEASE_CAPSULE | Freq: Every day | ORAL | 0 refills | Status: AC

## 2023-12-22 MED ORDER — HYDROCODONE-ACETAMINOPHEN 5-325 MG PO TABS
1.0000 | ORAL_TABLET | Freq: Once | ORAL | Status: AC
  Administered 2023-12-22: 1 via ORAL
  Filled 2023-12-22: qty 1

## 2023-12-22 MED ORDER — MELOXICAM 15 MG PO TBDP: 15.0000 mg | ORAL_TABLET | Freq: Every day | ORAL | 0 refills | Status: AC

## 2023-12-22 NOTE — ED Notes (Signed)
Pt ambulated from ED room to lobby upon discharge

## 2023-12-22 NOTE — ED Provider Notes (Signed)
[place] EMERGENCY DEPARTMENT AT [facility] Provider Note   CSN: [id number] Arrival date & time: 12/21/23  2325     History  No chief complaint on file.   [name] [name]. is a 62 y.o. male.  Patient presents to the emergency department for evaluation of "left hip pain".  He reports that it has been hurting for some time, but is worse currently.  No known injury.  He has had a previous left hip replacement.       Home Medications Prior to Admission medications   Medication Sig Start Date End Date Taking? Authorizing Provider  amLODipine (NORVASC) 5 MG tablet Take 1 tablet (5 mg total) by mouth daily. 12/03/23 01/02/24  Bobbitt, Franchot Mimes, NP  doxepin (SINEQUAN) 10 MG capsule Take 1 capsule (10 mg total) by mouth at bedtime. 12/03/23 01/02/24  Bobbitt, Franchot Mimes, NP  DULoxetine (CYMBALTA) 60 MG capsule Take 1 capsule (60 mg total) by mouth daily. 12/03/23 01/02/24  Bobbitt, Franchot Mimes, NP  gabapentin (NEURONTIN) 300 MG capsule Take 2 capsules (600 mg total) by mouth 3 (three) times daily. 08/18/23 09/17/23  Augusto Gamble, MD  gemfibrozil (LOPID) 600 MG tablet Take 1 tablet (600 mg total) by mouth 2 (two) times daily before a meal. 12/03/23 01/02/24  Bobbitt, Shalon E, NP  glipiZIDE (GLUCOTROL) 5 MG tablet Take 1 tablet (5 mg total) by mouth daily. 12/03/23 01/02/24  Bobbitt, Franchot Mimes, NP  insulin aspart (NOVOLOG) 100 UNIT/ML injection Inject 0-6 Units into the skin 3 (three) times daily with meals. 08/18/23 09/17/23  Augusto Gamble, MD  lisinopril (ZESTRIL) 10 MG tablet Take 1 tablet (10 mg total) by mouth daily. 12/03/23 01/02/24  Bobbitt, Franchot Mimes, NP  meloxicam (MOBIC) 15 MG tablet Take 1 tablet (15 mg total) by mouth daily. 12/03/23 01/02/24  Bobbitt, Franchot Mimes, NP  metFORMIN (GLUCOPHAGE) 500 MG tablet Take 1 tablet (500 mg total) by mouth 2 (two) times daily with a meal. 12/03/23 01/02/24  Bobbitt, Shalon E, NP  mirtazapine (REMERON) 15 MG tablet Take 1 tablet (15 mg total) by mouth at  bedtime. 12/03/23 01/02/24  Bobbitt, Franchot Mimes, NP  pantoprazole (PROTONIX) 40 MG tablet Take 1 tablet (40 mg total) by mouth daily. 12/03/23 01/02/24  Bobbitt, Franchot Mimes, NP  tamsulosin (FLOMAX) 0.4 MG CAPS capsule Take 1 capsule (0.4 mg total) by mouth daily. 12/03/23 01/02/24  Bobbitt, Franchot Mimes, NP      Allergies    Bee venom and Penicillins    Review of Systems   Review of Systems  Physical Exam Updated Vital Signs BP 123/82 (BP Location: Right Arm)   Pulse (!) 110   Temp 99.7 F (37.6 C) (Oral)   Resp 16   Ht 5\' 10"  (1.778 m)   Wt 96.6 kg   SpO2 98%   BMI 30.56 kg/m  Physical Exam Vitals and nursing note reviewed.  Constitutional:      General: He is not in acute distress.    Appearance: He is well-developed.  HENT:     Head: Normocephalic and atraumatic.     Mouth/Throat:     Mouth: Mucous membranes are moist.  Eyes:     General: Vision grossly intact. Gaze aligned appropriately.     Extraocular Movements: Extraocular movements intact.     Conjunctiva/sclera: Conjunctivae normal.  Cardiovascular:     Rate and Rhythm: Normal rate and regular rhythm.     Pulses: Normal pulses.     Heart sounds: Normal heart sounds, S1  normal and S2 normal. No murmur heard.    No friction rub. No gallop.  Pulmonary:     Effort: Pulmonary effort is normal. No respiratory distress.     Breath sounds: Normal breath sounds.  Abdominal:     Palpations: Abdomen is soft.     Tenderness: There is no abdominal tenderness. There is no guarding or rebound.     Hernia: No hernia is present.  Musculoskeletal:        General: No swelling.     Cervical back: Full passive range of motion without pain, normal range of motion and neck supple. No pain with movement, spinous process tenderness or muscular tenderness. Normal range of motion.     Lumbar back: Positive left straight leg raise test.       Back:     Right lower leg: No edema.     Left lower leg: No edema.     Comments: Normal strength  and sensation bilateral lower extremities.  No saddle anesthesia.  No foot drop.  Positive straight leg raise on the left at approximately 45 degrees  Skin:    General: Skin is warm and dry.     Capillary Refill: Capillary refill takes less than 2 seconds.     Findings: No ecchymosis, erythema, lesion or wound.  Neurological:     Mental Status: He is alert and oriented to person, place, and time.     GCS: GCS eye subscore is 4. GCS verbal subscore is 5. GCS motor subscore is 6.     Cranial Nerves: Cranial nerves 2-12 are intact.     Sensory: Sensation is intact.     Motor: Motor function is intact. No weakness or abnormal muscle tone.     Coordination: Coordination is intact.  Psychiatric:        Mood and Affect: Mood normal.        Speech: Speech normal.        Behavior: Behavior normal.     ED Results / Procedures / Treatments   Labs (all labs ordered are listed, but only abnormal results are displayed) Labs Reviewed - No data to display  EKG None  Radiology No results found.  Procedures Procedures    Medications Ordered in ED Medications  HYDROcodone-acetaminophen (NORCO/VICODIN) 5-325 MG per tablet 1 tablet (has no administration in time range)  ketorolac (TORADOL) injection 60 mg (has no administration in time range)    ED Course/ Medical Decision Making/ A&P                                 Medical Decision Making  Patient with history of "chronic hip pain".  He has had a prior left hip replacement.  He has been to other ERs with this pain in the past, as seen in care everywhere.  X-rays confirmed normal prosthetic hip.  Current presentation and examination is not consistent with hip pain.  Pain is posterior hip, SI joint area.  He has normal function.  There is a positive left leg straight leg raise on the left without any other neurologic findings.        Final Clinical Impression(s) / ED Diagnoses Final diagnoses:  Sciatica of left side    Rx / DC  Orders ED Discharge Orders     None         Jakevious Hollister, Canary Brim, MD 12/22/23 0006

## 2023-12-22 NOTE — Discharge Instructions (Signed)
Your x-ray today is negative for any obvious injuries or other problems that would explain your left hip pain.  I am prescribing you medication to help you with this pain.  I do encourage you to follow-up with Dr. Romeo Apple for further evaluation if your pain persists.  I am also given you referrals for obtaining a local primary doctor, in the interim I have given you a refill of your metformin and your omeprazole until you are able to find a local primary doctor to care for these problems for you.

## 2023-12-22 NOTE — ED Notes (Signed)
Pt ambulated to ED room.   Pt lying in bed watching tv upon entering room. While assessing pt was able to reposition himself in bed and stated there was pain.

## 2023-12-22 NOTE — ED Triage Notes (Signed)
Pt presents to ED with complaints of chronic left hip pain. Pt ambulatory to triage. Pt states he called Dr Tiburcio Pea office and they stated he needed an xray done first.

## 2024-01-03 ENCOUNTER — Ambulatory Visit (INDEPENDENT_AMBULATORY_CARE_PROVIDER_SITE_OTHER)
Admission: EM | Admit: 2024-01-03 | Discharge: 2024-01-04 | Disposition: A | Discharge status: Home / Self Care | Payer: 59 | Source: Home / Self Care

## 2024-01-03 DIAGNOSIS — F419 Anxiety disorder, unspecified: Secondary | ICD-10-CM | POA: Diagnosis not present

## 2024-01-03 DIAGNOSIS — M79673 Pain in unspecified foot: Secondary | ICD-10-CM | POA: Diagnosis present

## 2024-01-03 DIAGNOSIS — E119 Type 2 diabetes mellitus without complications: Secondary | ICD-10-CM | POA: Insufficient documentation

## 2024-01-03 DIAGNOSIS — E785 Hyperlipidemia, unspecified: Secondary | ICD-10-CM | POA: Insufficient documentation

## 2024-01-03 DIAGNOSIS — F1490 Cocaine use, unspecified, uncomplicated: Secondary | ICD-10-CM | POA: Diagnosis not present

## 2024-01-03 DIAGNOSIS — F1410 Cocaine abuse, uncomplicated: Secondary | ICD-10-CM

## 2024-01-03 DIAGNOSIS — F1910 Other psychoactive substance abuse, uncomplicated: Secondary | ICD-10-CM | POA: Diagnosis present

## 2024-01-03 DIAGNOSIS — F319 Bipolar disorder, unspecified: Secondary | ICD-10-CM | POA: Insufficient documentation

## 2024-01-03 DIAGNOSIS — Z9989 Dependence on other enabling machines and devices: Secondary | ICD-10-CM | POA: Insufficient documentation

## 2024-01-03 DIAGNOSIS — F17210 Nicotine dependence, cigarettes, uncomplicated: Secondary | ICD-10-CM | POA: Insufficient documentation

## 2024-01-03 DIAGNOSIS — Z5900 Homelessness unspecified: Secondary | ICD-10-CM | POA: Diagnosis not present

## 2024-01-03 DIAGNOSIS — W19XXXA Unspecified fall, initial encounter: Secondary | ICD-10-CM | POA: Insufficient documentation

## 2024-01-03 DIAGNOSIS — Z765 Malingerer [conscious simulation]: Secondary | ICD-10-CM | POA: Insufficient documentation

## 2024-01-03 DIAGNOSIS — F32A Depression, unspecified: Secondary | ICD-10-CM | POA: Diagnosis not present

## 2024-01-03 DIAGNOSIS — R454 Irritability and anger: Secondary | ICD-10-CM | POA: Insufficient documentation

## 2024-01-03 DIAGNOSIS — F1990 Other psychoactive substance use, unspecified, uncomplicated: Secondary | ICD-10-CM | POA: Diagnosis not present

## 2024-01-03 DIAGNOSIS — Z794 Long term (current) use of insulin: Secondary | ICD-10-CM | POA: Insufficient documentation

## 2024-01-03 DIAGNOSIS — I10 Essential (primary) hypertension: Secondary | ICD-10-CM | POA: Insufficient documentation

## 2024-01-03 DIAGNOSIS — F1010 Alcohol abuse, uncomplicated: Secondary | ICD-10-CM | POA: Diagnosis not present

## 2024-01-03 DIAGNOSIS — S8000XA Contusion of unspecified knee, initial encounter: Secondary | ICD-10-CM | POA: Insufficient documentation

## 2024-01-03 DIAGNOSIS — Z4689 Encounter for fitting and adjustment of other specified devices: Secondary | ICD-10-CM | POA: Diagnosis not present

## 2024-01-03 LAB — ETHANOL: Alcohol, Ethyl (B): <10 mg/dL (ref <10)

## 2024-01-03 LAB — CBC WITH DIFFERENTIAL/PLATELET
Abs Immature Granulocytes: 0.01 10*3/uL (ref 0.00–0.07)
Basophils Absolute: 0.1 10*3/uL (ref 0.0–0.1)
Basophils Relative: 1 %
Eosinophils Absolute: 0.2 10*3/uL (ref 0.0–0.5)
Eosinophils Relative: 3 %
HCT: 46.3 % (ref 39.0–52.0)
Hemoglobin: 15.4 g/dL (ref 13.0–17.0)
Immature Granulocytes: 0 %
Lymphocytes Relative: 48 %
Lymphs Abs: 2.9 10*3/uL (ref 0.7–4.0)
MCH: 29.8 pg (ref 26.0–34.0)
MCHC: 33.3 g/dL (ref 30.0–36.0)
MCV: 89.6 fL (ref 80.0–100.0)
Monocytes Absolute: 0.4 10*3/uL (ref 0.1–1.0)
Monocytes Relative: 6 %
Neutro Abs: 2.6 10*3/uL (ref 1.7–7.7)
Neutrophils Relative %: 42 %
Platelets: 364 10*3/uL (ref 150–400)
RBC: 5.17 MIL/uL (ref 4.22–5.81)
RDW: 15.6 % — ABNORMAL HIGH (ref 11.5–15.5)
WBC: 6.2 10*3/uL (ref 4.0–10.5)
nRBC: 0 % (ref 0.0–0.2)

## 2024-01-03 LAB — COMPREHENSIVE METABOLIC PANEL
ALT: 19 U/L (ref 0–44)
AST: 27 U/L (ref 15–41)
Albumin: 3.9 g/dL (ref 3.5–5.0)
Alkaline Phosphatase: 49 U/L (ref 38–126)
Anion gap: 12 (ref 5–15)
BUN: 10 mg/dL (ref 6–20)
CO2: 25 mmol/L (ref 22–32)
Calcium: 9.3 mg/dL (ref 8.9–10.3)
Chloride: 104 mmol/L (ref 98–111)
Creatinine, Ser: 1.13 mg/dL (ref 0.61–1.24)
GFR, Estimated: >60 mL/min (ref >60)
Glucose, Bld: 116 mg/dL — ABNORMAL HIGH (ref 70–99)
Potassium: 4.4 mmol/L (ref 3.5–5.1)
Sodium: 141 mmol/L (ref 135–145)
Total Bilirubin: 0.4 mg/dL (ref 0.0–1.2)
Total Protein: 7.1 g/dL (ref 6.5–8.1)

## 2024-01-03 LAB — HEMOGLOBIN A1C
Hgb A1c MFr Bld: 7.2 % — ABNORMAL HIGH (ref 4.8–5.6)
Mean Plasma Glucose: 159.94 mg/dL

## 2024-01-03 LAB — POCT URINE DRUG SCREEN - MANUAL ENTRY (I-SCREEN)
POC Amphetamine UR: NOT DETECTED
POC Buprenorphine (BUP): NOT DETECTED
POC Cocaine UR: POSITIVE — AB
POC Marijuana UR: NOT DETECTED
POC Methadone UR: NOT DETECTED
POC Methamphetamine UR: NOT DETECTED
POC Morphine: NOT DETECTED
POC Oxazepam (BZO): NOT DETECTED
POC Oxycodone UR: NOT DETECTED
POC Secobarbital (BAR): NOT DETECTED

## 2024-01-03 MED ORDER — ALUM & MAG HYDROXIDE-SIMETH 200-200-20 MG/5ML PO SUSP: 30.0000 mL | ORAL | Status: DC | PRN

## 2024-01-03 MED ORDER — ACETAMINOPHEN 325 MG PO TABS
650.0000 mg | ORAL_TABLET | Freq: Four times a day (QID) | ORAL | Status: DC | PRN
  Administered 2024-01-04 (×2): 650 mg via ORAL
  Filled 2024-01-03 (×2): qty 2

## 2024-01-03 MED ORDER — HYDROXYZINE HCL 25 MG PO TABS: 25.0000 mg | ORAL_TABLET | Freq: Three times a day (TID) | ORAL | Status: DC | PRN

## 2024-01-03 MED ORDER — MAGNESIUM HYDROXIDE 400 MG/5ML PO SUSP: 30.0000 mL | Freq: Every day | ORAL | Status: DC | PRN

## 2024-01-03 MED ORDER — TRAZODONE HCL 50 MG PO TABS
50.0000 mg | ORAL_TABLET | Freq: Every evening | ORAL | Status: DC | PRN
  Administered 2024-01-04: 50 mg via ORAL
  Filled 2024-01-03: qty 1

## 2024-01-03 NOTE — ED Notes (Signed)
 Pt present to [facility] stating he is here to get help with alcohol  abuse. Pt is A & O x 4. Pt is oriented to the unit and provided with meal. Upon assessment, pt asked when he will be sent to [facility]. Writer let pt know, he will be assessed in the morning and the decision will be made on his ongoing care. Pt denies SI/HI/AVH. Will continue to monitor for safety and provide support.

## 2024-01-03 NOTE — Progress Notes (Signed)
   01/03/24 1733  [facility] Triage Screening (Walk-ins at [facility] only)  How Did You Hear About Us ? Self  What Is the Reason for Your Visit/Call Today? [name] [name] is a 63 year old male presenting to [facility] unaccompanied. Pt states, I am needing detox. Pt is looking to detox off of alcohol . Pt reports he has used for 30 years, and drinks 6 pack of beer daily. Pt reports he is also using a gram of cocaine everyday. Pt reports he has used cocaine since he was 63 years old. Pt mentions he is using marijuana use daily as well. Pt reports that he used cocaine, heroin, alcohol  and marijuana today. Pt seems to be inconsistent with his usage of these substances. Pt has mentioned that he used cocaine today and not heroin, but will then say he has used heroin a few minutes later. Pt reports he drank a 6 pack of beer today, 20 bags of cocaine and heroin and one joint. Pt denies si, hi and avh.  How Long Has This Been Causing You Problems? > than 6 months  Have You Recently Had Any Thoughts About Hurting Yourself? No  Are You Planning to Commit Suicide/Harm Yourself At This time? No  Have you Recently Had Thoughts About Hurting Someone Sherral? No  Are You Planning To Harm Someone At This Time? No  Physical Abuse Denies  Verbal Abuse Denies  Sexual Abuse Denies  Exploitation of patient/patient's resources Denies  Self-Neglect Denies  Possible abuse reported to: Other (Comment)  Are you currently experiencing any auditory, visual or other hallucinations? No  Have You Used Any Alcohol  or Drugs in the Past 24 Hours? Yes  How long ago did you use Drugs or Alcohol ? today  What Did You Use and How Much? cocaine, alcohol  and heroin, (smoked one joint and 20 bag of heroin and cocaine)  Do you have any current medical co-morbidities that require immediate attention? No  Clinician description of patient physical appearance/behavior: calm, cooperative  What Do You Feel Would Help You the Most Today? Housing Assistance  If  access to [facility] was not available, would you have sought care in the Emergency Department? No  Determination of Need Routine (7 days)  Options For Referral Other: Comment

## 2024-01-03 NOTE — ED Provider Notes (Signed)
 [facility] Continuous Assessment Admission H&P  Date: 01/03/24 Patient Name: [name] [name]. MRN: [id number] Chief Complaint: Polysubstance abuse  Diagnoses:  Final diagnoses:  Polysubstance abuse (HCC)  Cocaine use  Alcohol  abuse  Homelessness  Malingering  Cocaine use disorder (HCC)    HPI:   [name] [name]. is a 63 year old male with a past psychiatric history significant for polysubstance abuse, cocaine use, and alcohol  abuse who presents to [facility] with a chief complaint of polysubstance abuse.  Patient presents to the encounter requesting detox from alcohol , cocaine, marijuana, and heroin.  Patient reports that he last used cocaine, alcohol , and marijuana.  He reports that he last used a half a gram of crack cocaine.  He also reports that he last drank a 6 pack of alcohol  as well as a couple shots of liquor.  Patient denies experiencing withdrawal symptoms but states that he does feel fidgety.  Patient endorses mild depression and rates his depression as 5 out of 10 with 10 being most severe.  Patient endorses depressive episodes 2 days out of the week.  Patient endorses the following depressive symptoms: feelings of sadness, lack of motivation, irritability, decreased energy, feelings of guilt/worthlessness, and hopelessness.  Patient denies decreased concentration.  Patient also endorses anxiety and rates his anxiety as 6 out of 10.  Patient's current stressor revolves around his substance abuse.  He reports that the longest time he has maintained sobriety is 3 years which occurred in 2014.  Patient is currently struggling with homelessness.  Patient is alert and oriented x 4, irritable yet cooperative, and fully engaged in conversation during the encounter.  Patient maintains good eye contact.  Patient's speech is clear, coherent, and with normal rate.  Patient's thought content is coherent and goal directed.  Patient's thought process is  logical.  Patient exhibits depressed mood with congruent affect.  Patient denies suicidal or homicidal ideations.  He further denies auditory or visual hallucinations and does not appear to be responding to internal/external stimuli.  Patient denies paranoia or delusional thoughts.  Patient endorses poor sleep and states that he has not slept in 3 days.  Patient endorses fair appetite but states that he last ate 3 days ago.  Patient endorses alcohol  consumption and illicit drug use.  Patient endorses tobacco use and smokes on average 5 to 6 cigarettes/day.  Total Time spent with patient: 20 minutes  Musculoskeletal  Strength & Muscle Tone: within normal limits Gait & Station: normal Patient leans: N/A  Psychiatric Specialty Exam  Presentation General Appearance:  Disheveled  Eye Contact: Good  Speech: Clear and Coherent; Normal Rate  Speech Volume: Normal  Handedness: Right   Mood and Affect  Mood: Irritable  Affect: Congruent   Thought Process  Thought Processes: Coherent; Goal Directed  Descriptions of Associations:Intact  Orientation:Full (Time, Place and Person)  Thought Content:WDL  Diagnosis of Schizophrenia or Schizoaffective disorder in past: No   Hallucinations:Hallucinations: None  Ideas of Reference:None  Suicidal Thoughts:Suicidal Thoughts: No  Homicidal Thoughts:Homicidal Thoughts: No   Sensorium  Memory: Immediate Good; Recent Good; Remote Good  Judgment: Fair  Insight: Fair   Art Therapist  Concentration: Good  Attention Span: Good  Recall: Good  Fund of Knowledge: Good  Language: Good   Psychomotor Activity  Psychomotor Activity: Psychomotor Activity: Normal   Assets  Assets: Communication Skills; Desire for Improvement; Physical Health; Resilience   Sleep  Sleep: Sleep: Poor   Nutritional Assessment (For OBS and FBC admissions  only) Has the patient had a weight loss or gain of 10 pounds or more  in the last 3 months?: No Has the patient had a decrease in food intake/or appetite?: No Does the patient have dental problems?: No Does the patient have eating habits or behaviors that may be indicators of an eating disorder including binging or inducing vomiting?: No Has the patient recently lost weight without trying?: 0 Has the patient been eating poorly because of a decreased appetite?: 0 Malnutrition Screening Tool Score: 0    Physical Exam Constitutional:      Appearance: Normal appearance.  HENT:     Head: Normocephalic and atraumatic.     Nose: Nose normal.     Mouth/Throat:     Mouth: Mucous membranes are moist.  Eyes:     Extraocular Movements: Extraocular movements intact.  Cardiovascular:     Rate and Rhythm: Bradycardia present.  Pulmonary:     Effort: Pulmonary effort is normal.  Musculoskeletal:        General: Normal range of motion.     Cervical back: Normal range of motion.  Skin:    General: Skin is warm and dry.  Neurological:     Mental Status: He is alert.  Psychiatric:        Attention and Perception: Attention and perception normal. He does not perceive auditory or visual hallucinations.        Mood and Affect: Affect normal. Mood is anxious and depressed.        Speech: Speech normal.        Behavior: Behavior is agitated. Behavior is cooperative.        Thought Content: Thought content normal. Thought content is not paranoid or delusional. Thought content does not include homicidal or suicidal ideation. Thought content does not include suicidal plan.        Cognition and Memory: Cognition and memory normal.        Judgment: Judgment is inappropriate.    Review of Systems  Constitutional: Negative.   HENT: Negative.    Eyes: Negative.   Respiratory: Negative.    Cardiovascular: Negative.   Gastrointestinal: Negative.   Skin: Negative.   Neurological: Negative.   Psychiatric/Behavioral:  Positive for depression and substance abuse. Negative  for hallucinations and suicidal ideas. The patient is nervous/anxious and has insomnia.     Blood pressure 124/86, pulse 75, temperature 98.3 F (36.8 C), temperature source Oral, resp. rate 18, SpO2 98%. There is no height or weight on file to calculate BMI.  Past Psychiatric History:  Substance-induced mood disorder Bipolar 1 disorder Polysubstance abuse Alcohol  use disorder Cocaine use disorder  Is the patient at risk to self? No  Has the patient been a risk to self in the past 6 months? No .    Has the patient been a risk to self within the distant past? Yes   Is the patient a risk to others? No   Has the patient been a risk to others in the past 6 months? No   Has the patient been a risk to others within the distant past? No   Past Medical History:  Pseudocyst of pancreas Acid reflux Arthritis Bronchitis Chronic back pain Chronic neck pain Chronic pain Diabetes mellitus Fracture lower leg Gout Hyperlipidemia Hypertension Pancreatitis  Family History:  See chart review  Social History:  Patient is currently homeless.  Patient denies pending legal issues and denies probation.  Last Labs:  Admission on 01/03/2024, Discharged on 01/04/2024  Component  Date Value Ref Range Status   WBC 01/03/2024 6.2  4.0 - 10.5 K/uL Final   RBC 01/03/2024 5.17  4.22 - 5.81 MIL/uL Final   Hemoglobin 01/03/2024 15.4  13.0 - 17.0 g/dL Final   HCT 98/88/7974 46.3  39.0 - 52.0 % Final   MCV 01/03/2024 89.6  80.0 - 100.0 fL Final   MCH 01/03/2024 29.8  26.0 - 34.0 pg Final   MCHC 01/03/2024 33.3  30.0 - 36.0 g/dL Final   RDW 98/88/7974 15.6 (H)  11.5 - 15.5 % Final   Platelets 01/03/2024 364  150 - 400 K/uL Final   nRBC 01/03/2024 0.0  0.0 - 0.2 % Final   Neutrophils Relative % 01/03/2024 42  % Final   Neutro Abs 01/03/2024 2.6  1.7 - 7.7 K/uL Final   Lymphocytes Relative 01/03/2024 48  % Final   Lymphs Abs 01/03/2024 2.9  0.7 - 4.0 K/uL Final   Monocytes Relative 01/03/2024 6  %  Final   Monocytes Absolute 01/03/2024 0.4  0.1 - 1.0 K/uL Final   Eosinophils Relative 01/03/2024 3  % Final   Eosinophils Absolute 01/03/2024 0.2  0.0 - 0.5 K/uL Final   Basophils Relative 01/03/2024 1  % Final   Basophils Absolute 01/03/2024 0.1  0.0 - 0.1 K/uL Final   Immature Granulocytes 01/03/2024 0  % Final   Abs Immature Granulocytes 01/03/2024 0.01  0.00 - 0.07 K/uL Final   Performed at [facility], [address], [place], [place] [address]   Sodium 01/03/2024 141  135 - 145 mmol/L Final   Potassium 01/03/2024 4.4  3.5 - 5.1 mmol/L Final   Chloride 01/03/2024 104  98 - 111 mmol/L Final   CO2 01/03/2024 25  22 - 32 mmol/L Final   Glucose, Bld 01/03/2024 116 (H)  70 - 99 mg/dL Final   Glucose reference range applies only to samples taken after fasting for at least 8 hours.   BUN 01/03/2024 10  6 - 20 mg/dL Final   Creatinine, Ser 01/03/2024 1.13  0.61 - 1.24 mg/dL Final   Calcium  01/03/2024 9.3  8.9 - 10.3 mg/dL Final   Total Protein 98/88/7974 7.1  6.5 - 8.1 g/dL Final   Albumin 98/88/7974 3.9  3.5 - 5.0 g/dL Final   AST 98/88/7974 27  15 - 41 U/L Final   ALT 01/03/2024 19  0 - 44 U/L Final   Alkaline Phosphatase 01/03/2024 49  38 - 126 U/L Final   Total Bilirubin 01/03/2024 0.4  0.0 - 1.2 mg/dL Final   GFR, Estimated 01/03/2024 >60  >60 mL/min Final   Comment: (NOTE) Calculated using the CKD-EPI Creatinine Equation (2021)    Anion gap 01/03/2024 12  5 - 15 Final   Performed at [facility], [address], [place], [place] [address]   Hgb A1c MFr Bld 01/03/2024 7.2 (H)  4.8 - 5.6 % Final   Comment: (NOTE) Pre diabetes:          5.7%-6.4%  Diabetes:              >6.4%  Glycemic control for   <7.0% adults with diabetes    Mean Plasma Glucose 01/03/2024 159.94  mg/dL Final   Performed at [facility], [address], [place], [place] [address]   Alcohol , Ethyl (B) 01/03/2024 <10  <10 mg/dL Final   Comment: (NOTE) Lowest detectable limit for serum  alcohol  is 10 mg/dL.  For medical purposes only. Performed at  [facility], [address], [place], [place] [address]    Cholesterol 01/03/2024 204 (H)  0 - 200 mg/dL Final   Triglycerides 98/88/7974 706 (H)  <150 mg/dL Final   HDL 98/88/7974 61  >40 mg/dL Final   Total CHOL/HDL Ratio 01/03/2024 3.3  RATIO Final   VLDL 01/03/2024 UNABLE TO CALCULATE IF TRIGLYCERIDE OVER 400 mg/dL  0 - 40 mg/dL Final   LDL Cholesterol 01/03/2024 UNABLE TO CALCULATE IF TRIGLYCERIDE OVER 400 mg/dL  0 - 99 mg/dL Final   Comment:        Total Cholesterol/HDL:CHD Risk Coronary Heart Disease Risk Table                     Men   Women  1/2 Average Risk   3.4   3.3  Average Risk       5.0   4.4  2 X Average Risk   9.6   7.1  3 X Average Risk  23.4   11.0        Use the calculated Patient Ratio above and the CHD Risk Table to determine the patient's CHD Risk.        ATP III CLASSIFICATION (LDL):  <100     mg/dL   Optimal  899-870  mg/dL   Near or Above                    Optimal  130-159  mg/dL   Borderline  839-810  mg/dL   High  >809     mg/dL   Very High Performed at [facility], [address], [place], [place] [address]    POC Amphetamine UR 01/03/2024 None Detected  NONE DETECTED (Cut Off Level 1000 ng/mL) Final   POC Secobarbital (BAR) 01/03/2024 None Detected  NONE DETECTED (Cut Off Level 300 ng/mL) Final   POC Buprenorphine (BUP) 01/03/2024 None Detected  NONE DETECTED (Cut Off Level 10 ng/mL) Final   POC Oxazepam (BZO) 01/03/2024 None Detected  NONE DETECTED (Cut Off Level 300 ng/mL) Final   POC Cocaine UR 01/03/2024 Positive (A)  NONE DETECTED (Cut Off Level 300 ng/mL) Final   POC Methamphetamine UR 01/03/2024 None Detected  NONE DETECTED (Cut Off Level 1000 ng/mL) Final   POC Morphine  01/03/2024 None Detected  NONE DETECTED (Cut Off Level 300 ng/mL) Final   POC Methadone UR 01/03/2024 None Detected  NONE DETECTED (Cut Off Level 300 ng/mL) Final   POC Oxycodone  UR 01/03/2024  None Detected  NONE DETECTED (Cut Off Level 100 ng/mL) Final   POC Marijuana UR 01/03/2024 None Detected  NONE DETECTED (Cut Off Level 50 ng/mL) Final   Direct LDL 01/03/2024 91  0 - 99 mg/dL Final   Performed at [facility], [address], [place], [place] [address]  Admission on 11/29/2023, Discharged on 11/29/2023  Component Date Value Ref Range Status   Sodium 11/29/2023 138  135 - 145 mmol/L Final   Potassium 11/29/2023 3.9  3.5 - 5.1 mmol/L Final   Chloride 11/29/2023 100  98 - 111 mmol/L Final   CO2 11/29/2023 27  22 - 32 mmol/L Final   Glucose, Bld 11/29/2023 118 (H)  70 - 99 mg/dL Final   Glucose reference range applies only to samples taken after fasting for at least 8 hours.   BUN 11/29/2023 7  6 - 20 mg/dL Final   Creatinine, Ser 11/29/2023 1.21  0.61 - 1.24 mg/dL Final   Calcium   11/29/2023 8.8 (L)  8.9 - 10.3 mg/dL Final   Total Protein 87/92/7975 7.6  6.5 - 8.1 g/dL Final   Albumin 87/92/7975 3.9  3.5 - 5.0 g/dL Final   AST 87/92/7975 41  15 - 41 U/L Final   ALT 11/29/2023 20  0 - 44 U/L Final   Alkaline Phosphatase 11/29/2023 48  38 - 126 U/L Final   Total Bilirubin 11/29/2023 0.7  <1.2 mg/dL Final   GFR, Estimated 11/29/2023 >60  >60 mL/min Final   Comment: (NOTE) Calculated using the CKD-EPI Creatinine Equation (2021)    Anion gap 11/29/2023 11  5 - 15 Final   Performed at [facility], [address] [place], [place] [address]   Alcohol , Ethyl (B) 11/29/2023 <10  <10 mg/dL Final   Comment: (NOTE) Lowest detectable limit for serum alcohol  is 10 mg/dL.  For medical purposes only. Performed at [facility], [address] [place], [place] [address]    Salicylate Lvl 11/29/2023 <7.0 (L)  7.0 - 30.0 mg/dL Final   Performed at [facility], [address] [place], [place] [address]   Acetaminophen  (Tylenol ), Serum 11/29/2023 <10 (L)  10 - 30 ug/mL Final   Comment: (NOTE) Therapeutic concentrations vary significantly. A range  of 10-30 ug/mL  may be an effective concentration for many patients. However, some  are best treated at concentrations outside of this range. Acetaminophen  concentrations >150 ug/mL at 4 hours after ingestion  and >50 ug/mL at 12 hours after ingestion are often associated with  toxic reactions.  Performed at [facility], [address] [place], [place] [address]    WBC 11/29/2023 5.6  4.0 - 10.5 K/uL Final   RBC 11/29/2023 5.01  4.22 - 5.81 MIL/uL Final   Hemoglobin 11/29/2023 15.1  13.0 - 17.0 g/dL Final   HCT 87/92/7975 44.4  39.0 - 52.0 % Final   MCV 11/29/2023 88.6  80.0 - 100.0 fL Final   MCH 11/29/2023 30.1  26.0 - 34.0 pg Final   MCHC 11/29/2023 34.0  30.0 - 36.0 g/dL Final   RDW 87/92/7975 16.0 (H)  11.5 - 15.5 % Final   Platelets 11/29/2023 306  150 - 400 K/uL Final   nRBC 11/29/2023 0.0  0.0 - 0.2 % Final   Performed at [facility], [address] [place], [place] [address]  Admission on 08/18/2023, Discharged on 08/19/2023  Component Date Value Ref Range Status   Glucose-Capillary 08/18/2023 130 (H)  70 - 99 mg/dL Final   Glucose reference range applies only to samples taken after fasting for at least 8 hours.   Glucose-Capillary 08/18/2023 184 (H)  70 - 99 mg/dL Final   Glucose reference range applies only to samples taken after fasting for at least 8 hours.   Glucose-Capillary 08/18/2023 284 (H)  70 - 99 mg/dL Final   Glucose reference range applies only to samples taken after fasting for at least 8 hours.   Glucose-Capillary 08/19/2023 256 (H)  70 - 99 mg/dL Final   Glucose reference range applies only to samples taken after fasting for at least 8 hours.  Admission on 08/18/2023, Discharged on 08/18/2023  Component Date Value Ref Range Status   WBC 08/18/2023 4.2  4.0 - 10.5 K/uL Final   RBC 08/18/2023 4.66  4.22 - 5.81 MIL/uL Final   Hemoglobin 08/18/2023 14.1  13.0 - 17.0 g/dL Final   HCT 91/73/7975 42.7  39.0 - 52.0 % Final   MCV  08/18/2023 91.6  80.0 - 100.0 fL Final   MCH 08/18/2023 30.3  26.0 - 34.0 pg Final   MCHC 08/18/2023 33.0  30.0 - 36.0 g/dL Final   RDW 91/73/7975 15.5  11.5 - 15.5 % Final   Platelets 08/18/2023 258  150 - 400 K/uL Final   nRBC 08/18/2023 0.0  0.0 - 0.2 % Final   Performed at [facility], [address], [place], [place] [address]   Sodium 08/18/2023 130 (L)  135 - 145 mmol/L Final   Potassium 08/18/2023 4.0  3.5 - 5.1 mmol/L Final   Chloride 08/18/2023 96 (L)  98 - 111 mmol/L Final   CO2 08/18/2023 24  22 - 32 mmol/L Final   Glucose, Bld 08/18/2023 400 (H)  70 - 99 mg/dL Final   Glucose reference range applies only to samples taken after fasting for at least 8 hours.   BUN 08/18/2023 19  6 - 20 mg/dL Final   Creatinine, Ser 08/18/2023 1.04  0.61 - 1.24 mg/dL Final   Calcium  08/18/2023 9.5  8.9 - 10.3 mg/dL Final   GFR, Estimated 08/18/2023 >60  >60 mL/min Final   Comment: (NOTE) Calculated using the CKD-EPI Creatinine Equation (2021)    Anion gap 08/18/2023 10  5 - 15 Final   Performed at [facility], [address], [place], [place] [address]   Glucose-Capillary 08/18/2023 477 (H)  70 - 99 mg/dL Final   Glucose reference range applies only to samples taken after fasting for at least 8 hours.   Glucose-Capillary 08/18/2023 240 (H)  70 - 99 mg/dL Final   Glucose reference range applies only to samples taken after fasting for at least 8 hours.  Admission on 08/11/2023, Discharged on 08/18/2023  Component Date Value Ref Range Status   Glucose-Capillary 08/11/2023 179 (H)  70 - 99 mg/dL Final   Glucose reference range applies only to samples taken after fasting for at least 8 hours.   Glucose-Capillary 08/12/2023 203 (H)  70 - 99 mg/dL Final   Glucose reference range applies only to samples taken after fasting for at least 8 hours.   Glucose-Capillary 08/13/2023 157 (H)  70 - 99 mg/dL Final   Glucose reference range applies only to samples taken after fasting for at least  8 hours.   Glucose-Capillary 08/17/2023 406 (H)  70 - 99 mg/dL Final   Glucose reference range applies only to samples taken after fasting for at least 8 hours.   Glucose-Capillary 08/17/2023 429 (H)  70 - 99 mg/dL Final   Glucose reference range applies only to samples taken after fasting for at least 8 hours.   Glucose-Capillary 08/17/2023 369 (H)  70 - 99 mg/dL Final   Glucose reference range applies only to samples taken after fasting for at least 8 hours.   Glucose-Capillary 08/17/2023 281 (H)  70 - 99 mg/dL Final   Glucose reference range applies only to samples taken after fasting for at least 8 hours.   Glucose-Capillary 08/17/2023 429 (H)  70 - 99 mg/dL Final   Glucose reference range applies only to samples taken after fasting for at least 8 hours.   Glucose-Capillary 08/17/2023 326 (H)  70 - 99 mg/dL Final   Glucose reference range applies only to samples taken after fasting for at least 8 hours.   Glucose-Capillary 08/18/2023 416 (H)  70 - 99 mg/dL Final   Glucose reference range applies only to samples taken after fasting for at least 8 hours.   Glucose-Capillary 08/17/2023  499 (H)  70 - 99 mg/dL Final   Glucose reference range applies only to samples taken after fasting for at least 8 hours.   Glucose-Capillary 08/17/2023 305 (H)  70 - 99 mg/dL Final   Glucose reference range applies only to samples taken after fasting for at least 8 hours.   Glucose-Capillary 08/18/2023 403 (H)  70 - 99 mg/dL Final   Glucose reference range applies only to samples taken after fasting for at least 8 hours.  Admission on 08/11/2023, Discharged on 08/11/2023  Component Date Value Ref Range Status   WBC 08/11/2023 6.3  4.0 - 10.5 K/uL Final   RBC 08/11/2023 5.23  4.22 - 5.81 MIL/uL Final   Hemoglobin 08/11/2023 15.4  13.0 - 17.0 g/dL Final   HCT 91/80/7975 47.0  39.0 - 52.0 % Final   MCV 08/11/2023 89.9  80.0 - 100.0 fL Final   MCH 08/11/2023 29.4  26.0 - 34.0 pg Final   MCHC 08/11/2023 32.8   30.0 - 36.0 g/dL Final   RDW 91/80/7975 15.8 (H)  11.5 - 15.5 % Final   Platelets 08/11/2023 302  150 - 400 K/uL Final   nRBC 08/11/2023 0.0  0.0 - 0.2 % Final   Neutrophils Relative % 08/11/2023 55  % Final   Neutro Abs 08/11/2023 3.5  1.7 - 7.7 K/uL Final   Lymphocytes Relative 08/11/2023 31  % Final   Lymphs Abs 08/11/2023 2.0  0.7 - 4.0 K/uL Final   Monocytes Relative 08/11/2023 11  % Final   Monocytes Absolute 08/11/2023 0.7  0.1 - 1.0 K/uL Final   Eosinophils Relative 08/11/2023 2  % Final   Eosinophils Absolute 08/11/2023 0.1  0.0 - 0.5 K/uL Final   Basophils Relative 08/11/2023 1  % Final   Basophils Absolute 08/11/2023 0.0  0.0 - 0.1 K/uL Final   Immature Granulocytes 08/11/2023 0  % Final   Abs Immature Granulocytes 08/11/2023 0.01  0.00 - 0.07 K/uL Final   Performed at [facility], [address], [place], [place] [address]   Sodium 08/11/2023 139  135 - 145 mmol/L Final   Potassium 08/11/2023 4.5  3.5 - 5.1 mmol/L Final   Chloride 08/11/2023 97 (L)  98 - 111 mmol/L Final   CO2 08/11/2023 27  22 - 32 mmol/L Final   Glucose, Bld 08/11/2023 125 (H)  70 - 99 mg/dL Final   Glucose reference range applies only to samples taken after fasting for at least 8 hours.   BUN 08/11/2023 20  6 - 20 mg/dL Final   Creatinine, Ser 08/11/2023 1.13  0.61 - 1.24 mg/dL Final   Calcium  08/11/2023 9.7  8.9 - 10.3 mg/dL Final   Total Protein 91/80/7975 7.1  6.5 - 8.1 g/dL Final   Albumin 91/80/7975 4.0  3.5 - 5.0 g/dL Final   AST 91/80/7975 34  15 - 41 U/L Final   ALT 08/11/2023 17  0 - 44 U/L Final   Alkaline Phosphatase 08/11/2023 41  38 - 126 U/L Final   Total Bilirubin 08/11/2023 0.3  0.3 - 1.2 mg/dL Final   GFR, Estimated 08/11/2023 >60  >60 mL/min Final   Comment: (NOTE) Calculated using the CKD-EPI Creatinine Equation (2021)    Anion gap 08/11/2023 15  5 - 15 Final   Performed at [facility], [address], [place], [place] [address]   Hgb A1c MFr Bld 08/11/2023 6.9 (H)   4.8 - 5.6 % Final   Comment: (NOTE) Pre diabetes:  5.7%-6.4%  Diabetes:              >6.4%  Glycemic control for   <7.0% adults with diabetes    Mean Plasma Glucose 08/11/2023 151.33  mg/dL Final   Performed at [facility], [address] [place], [place] [address]   Magnesium  08/11/2023 2.2  1.7 - 2.4 mg/dL Final   Performed at [facility], [address], [place], [place] [address]   Alcohol , Ethyl (B) 08/11/2023 <10  <10 mg/dL Final   Comment: (NOTE) Lowest detectable limit for serum alcohol  is 10 mg/dL.  For medical purposes only. Performed at [facility], [address] [place], [place] [address]    Cholesterol 08/11/2023 244 (H)  0 - 200 mg/dL Final   Triglycerides 91/80/7975 528 (H)  <150 mg/dL Final   HDL 91/80/7975 79  >40 mg/dL Final   Total CHOL/HDL Ratio 08/11/2023 3.1  RATIO Final   VLDL 08/11/2023 UNABLE TO CALCULATE IF TRIGLYCERIDE OVER 400 mg/dL  0 - 40 mg/dL Final   LDL Cholesterol 08/11/2023 UNABLE TO CALCULATE IF TRIGLYCERIDE OVER 400 mg/dL  0 - 99 mg/dL Final   Comment:        Total Cholesterol/HDL:CHD Risk Coronary Heart Disease Risk Table                     Men   Women  1/2 Average Risk   3.4   3.3  Average Risk       5.0   4.4  2 X Average Risk   9.6   7.1  3 X Average Risk  23.4   11.0        Use the calculated Patient Ratio above and the CHD Risk Table to determine the patient's CHD Risk.        ATP III CLASSIFICATION (LDL):  <100     mg/dL   Optimal  899-870  mg/dL   Near or Above                    Optimal  130-159  mg/dL   Borderline  839-810  mg/dL   High  >809     mg/dL   Very High Performed at [facility], [address] [place], [place] [address]    TSH 08/11/2023 0.855  0.350 - 4.500 uIU/mL Final   Comment: Performed by a 3rd Generation assay with a functional sensitivity of <=0.01 uIU/mL. Performed at [facility], [address], [place], [place] [address]    Prolactin 08/11/2023 6.8   3.6 - 25.2 ng/mL Final   Comment: (NOTE) Performed At: [facility] [address] [place], [place] [id number] Jennette Shorter MD Ey:[id number]    Color, Urine 08/11/2023 YELLOW  YELLOW Final   APPearance 08/11/2023 CLEAR  CLEAR Final   Specific Gravity, Urine 08/11/2023 1.025  1.005 - 1.030 Final   pH 08/11/2023 6.0  5.0 - 8.0 Final   Glucose, UA 08/11/2023 NEGATIVE  NEGATIVE mg/dL Final   Hgb urine dipstick 08/11/2023 NEGATIVE  NEGATIVE Final   Bilirubin Urine 08/11/2023 NEGATIVE  NEGATIVE Final   Ketones, ur 08/11/2023 20 (A)  NEGATIVE mg/dL Final   Protein, ur 91/80/7975 NEGATIVE  NEGATIVE mg/dL Final   Nitrite 91/80/7975 NEGATIVE  NEGATIVE Final   Leukocytes,Ua 08/11/2023 NEGATIVE  NEGATIVE Final   RBC / HPF 08/11/2023 0-5  0 - 5 RBC/hpf Final   WBC, UA 08/11/2023 0-5  0 - 5  WBC/hpf Final   Bacteria, UA 08/11/2023 RARE (A)  NONE SEEN Final   Squamous Epithelial / HPF 08/11/2023 0-5  0 - 5 /HPF Final   Mucus 08/11/2023 PRESENT   Final   Performed at [facility], [address] [place], [place] [address]   POC Amphetamine UR 08/11/2023 None Detected  NONE DETECTED (Cut Off Level 1000 ng/mL) Final   POC Secobarbital (BAR) 08/11/2023 None Detected  NONE DETECTED (Cut Off Level 300 ng/mL) Final   POC Buprenorphine (BUP) 08/11/2023 None Detected  NONE DETECTED (Cut Off Level 10 ng/mL) Final   POC Oxazepam (BZO) 08/11/2023 Positive (A)  NONE DETECTED (Cut Off Level 300 ng/mL) Final   POC Cocaine UR 08/11/2023 Positive (A)  NONE DETECTED (Cut Off Level 300 ng/mL) Final   POC Methamphetamine UR 08/11/2023 None Detected  NONE DETECTED (Cut Off Level 1000 ng/mL) Final   POC Morphine  08/11/2023 None Detected  NONE DETECTED (Cut Off Level 300 ng/mL) Final   POC Methadone UR 08/11/2023 None Detected  NONE DETECTED (Cut Off Level 300 ng/mL) Final   POC Oxycodone  UR 08/11/2023 None Detected  NONE DETECTED (Cut Off Level 100 ng/mL) Final   POC Marijuana UR 08/11/2023 Positive (A)   NONE DETECTED (Cut Off Level 50 ng/mL) Final   Direct LDL 08/11/2023 101 (H)  0 - 99 mg/dL Final   Performed at [facility], [address], [place], [place] [address]  Admission on 07/09/2023, Discharged on 07/09/2023  Component Date Value Ref Range Status   Hgb A1c MFr Bld 07/09/2023 5.6  4.8 - 5.6 % Final   Comment: (NOTE)         Prediabetes: 5.7 - 6.4         Diabetes: >6.4         Glycemic control for adults with diabetes: <7.0    Mean Plasma Glucose 07/09/2023 114  mg/dL Final   Comment: (NOTE) Performed At: [facility] [address] [place], [place] [id number] Jennette Shorter MD Ey:[id number]   Admission on 07/08/2023, Discharged on 07/08/2023  Component Date Value Ref Range Status   Glucose-Capillary 07/08/2023 143 (H)  70 - 99 mg/dL Final   Glucose reference range applies only to samples taken after fasting for at least 8 hours.  Admission on 07/07/2023, Discharged on 07/07/2023  Component Date Value Ref Range Status   Sodium 07/07/2023 140  135 - 145 mmol/L Final   Potassium 07/07/2023 3.7  3.5 - 5.1 mmol/L Final   Chloride 07/07/2023 105  98 - 111 mmol/L Final   CO2 07/07/2023 26  22 - 32 mmol/L Final   Glucose, Bld 07/07/2023 85  70 - 99 mg/dL Final   Glucose reference range applies only to samples taken after fasting for at least 8 hours.   BUN 07/07/2023 12  6 - 20 mg/dL Final   Creatinine, Ser 07/07/2023 0.99  0.61 - 1.24 mg/dL Final   Calcium  07/07/2023 9.1  8.9 - 10.3 mg/dL Final   Total Protein 92/84/7975 7.2  6.5 - 8.1 g/dL Final   Albumin 92/84/7975 3.9  3.5 - 5.0 g/dL Final   AST 92/84/7975 26  15 - 41 U/L Final   ALT 07/07/2023 20  0 - 44 U/L Final   Alkaline Phosphatase 07/07/2023 30 (L)  38 - 126 U/L Final   Total Bilirubin 07/07/2023 0.7  0.3 - 1.2 mg/dL Final   GFR, Estimated 07/07/2023 >60  >60 mL/min Final   Comment: (NOTE) Calculated using the CKD-EPI  Creatinine Equation (2021)    Anion gap 07/07/2023 9  5 - 15 Final   Performed  at [facility], [address], [place], [place] [address]   Alcohol , Ethyl (B) 07/07/2023 <10  <10 mg/dL Final   Comment: (NOTE) Lowest detectable limit for serum alcohol  is 10 mg/dL.  For medical purposes only. Performed at [facility], [address] [place], [place] [address]    WBC 07/07/2023 6.4  4.0 - 10.5 K/uL Final   RBC 07/07/2023 5.04  4.22 - 5.81 MIL/uL Final   Hemoglobin 07/07/2023 14.5  13.0 - 17.0 g/dL Final   HCT 92/84/7975 45.3  39.0 - 52.0 % Final   MCV 07/07/2023 89.9  80.0 - 100.0 fL Final   MCH 07/07/2023 28.8  26.0 - 34.0 pg Final   MCHC 07/07/2023 32.0  30.0 - 36.0 g/dL Final   RDW 92/84/7975 15.6 (H)  11.5 - 15.5 % Final   Platelets 07/07/2023 446 (H)  150 - 400 K/uL Final   nRBC 07/07/2023 0.0  0.0 - 0.2 % Final   Performed at [facility], [address] [place], [place] [address]   Opiates 07/07/2023 NONE DETECTED  NONE DETECTED Final   Cocaine 07/07/2023 POSITIVE (A)  NONE DETECTED Final   Benzodiazepines 07/07/2023 POSITIVE (A)  NONE DETECTED Final   Amphetamines 07/07/2023 NONE DETECTED  NONE DETECTED Final   Tetrahydrocannabinol 07/07/2023 NONE DETECTED  NONE DETECTED Final   Barbiturates 07/07/2023 NONE DETECTED  NONE DETECTED Final   Comment: (NOTE) DRUG SCREEN FOR MEDICAL PURPOSES ONLY.  IF CONFIRMATION IS NEEDED FOR ANY PURPOSE, NOTIFY LAB WITHIN 5 DAYS.  LOWEST DETECTABLE LIMITS FOR URINE DRUG SCREEN Drug Class                     Cutoff (ng/mL) Amphetamine and metabolites    1000 Barbiturate and metabolites    200 Benzodiazepine                 200 Opiates and metabolites        300 Cocaine and metabolites        300 THC                            50 Performed at [facility], [address] [place], [place] [address]     Allergies: Bee venom and Penicillins  Medications:  PTA Medications  Medication Sig   Meloxicam  15 MG TBDP Take 15 mg by mouth daily.   omeprazole  (PRILOSEC) 40 MG capsule Take 1  capsule (40 mg total) by mouth daily.   metFORMIN  (GLUCOPHAGE ) 500 MG tablet Take 1 tablet (500 mg total) by mouth 2 (two) times daily with a meal.   gabapentin  (NEURONTIN ) 600 MG tablet Take 1 tablet (600 mg total) by mouth 3 (three) times daily.      Medical Decision Making  Patient presents to the encounter with a chief complaint of polysubstance abuse.  Patient is requesting detox from his use of cocaine and alcohol  use.  Patient denies withdrawal symptoms at this time.  Patient denies suicidal or homicidal ideations.  Based on my assessment, patient meets criteria for overnight observation with the plan to refer patient out for detox.  Lab orders to be initiated prior to the patient being admitted onto the unit.  Recommendations  Based on my evaluation the patient does not appear to have an emergency medical  condition.  Corliss Coggeshall E Jenella Craigie, PA 01/03/24  6:32 PM
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
## 2024-01-04 ENCOUNTER — Other Ambulatory Visit: Payer: Self-pay

## 2024-01-04 ENCOUNTER — Encounter (HOSPITAL_COMMUNITY): Payer: Self-pay

## 2024-01-04 ENCOUNTER — Emergency Department (HOSPITAL_COMMUNITY)
Admission: EM | Admit: 2024-01-04 | Discharge: 2024-01-04 | Disposition: A | Discharge status: Home / Self Care | Payer: 59 | Attending: Emergency Medicine | Admitting: Emergency Medicine

## 2024-01-04 DIAGNOSIS — Z5900 Homelessness unspecified: Secondary | ICD-10-CM

## 2024-01-04 DIAGNOSIS — F1990 Other psychoactive substance use, unspecified, uncomplicated: Secondary | ICD-10-CM | POA: Insufficient documentation

## 2024-01-04 DIAGNOSIS — S8000XA Contusion of unspecified knee, initial encounter: Secondary | ICD-10-CM | POA: Insufficient documentation

## 2024-01-04 DIAGNOSIS — Z794 Long term (current) use of insulin: Secondary | ICD-10-CM | POA: Insufficient documentation

## 2024-01-04 DIAGNOSIS — F1410 Cocaine abuse, uncomplicated: Secondary | ICD-10-CM

## 2024-01-04 DIAGNOSIS — W19XXXA Unspecified fall, initial encounter: Secondary | ICD-10-CM | POA: Insufficient documentation

## 2024-01-04 LAB — LIPID PANEL
Cholesterol: 204 mg/dL — ABNORMAL HIGH (ref 0–200)
HDL: 61 mg/dL (ref >40)
LDL Cholesterol: UNDETERMINED mg/dL (ref 0–99)
Total CHOL/HDL Ratio: 3.3 {ratio}
Triglycerides: 706 mg/dL — ABNORMAL HIGH (ref <150)
VLDL: UNDETERMINED mg/dL (ref 0–40)

## 2024-01-04 LAB — LDL CHOLESTEROL, DIRECT: Direct LDL: 91 mg/dL (ref 0–99)

## 2024-01-04 MED ORDER — NAPROXEN 500 MG PO TABS
500.0000 mg | ORAL_TABLET | Freq: Once | ORAL | Status: AC
  Administered 2024-01-04: 500 mg via ORAL
  Filled 2024-01-04: qty 1

## 2024-01-04 MED ORDER — LOPERAMIDE HCL 2 MG PO CAPS
2.0000 mg | ORAL_CAPSULE | Freq: Once | ORAL | Status: AC
  Administered 2024-01-04: 2 mg via ORAL
  Filled 2024-01-04: qty 1

## 2024-01-04 MED ORDER — LOPERAMIDE HCL 2 MG PO CAPS: 2.0000 mg | ORAL_CAPSULE | Freq: Four times a day (QID) | ORAL | 0 refills | Status: AC | PRN

## 2024-01-04 NOTE — Discharge Instructions (Addendum)

## 2024-01-04 NOTE — ED Notes (Signed)
 Patient being discharged today. Patient is stable and denies SI/HI/AVH. Discharged instructions reviewed with patient who verbalized understanding. All belongings collected and sent with patient. Bus pass issued for transportation and he offered no concerns.

## 2024-01-04 NOTE — ED Notes (Signed)
 Patient had a bowl of oatmeal and bowl of cereal for breakfast

## 2024-01-04 NOTE — ED Provider Notes (Signed)
 FBC/OBS ASAP Discharge Summary  Date and Time: 01/04/2024 9:48 AM  Name: [name] [name].  MRN:  [id number]   Discharge Diagnoses:  Final diagnoses:  Polysubstance abuse (HCC)  Cocaine use  Alcohol  abuse  Homelessness  Malingering  Cocaine use disorder (HCC)    Subjective: Seen and assessed at bedside. Denies SI/HI/AVH. Reports mild depression but otherwise doing well. Reports minimal withdrawal of myalgia and fatigue but no other withdrawal symptoms. Denies history of seizures or Dts. He does not meet inpatient or IVC criteria. His primary concern was housing. He was provided resources by [facility] resources for sober living communities to call and can be discharged at this time.   Stay Summary:  Patient was initially admitted to the observation unit for alcohol  detox and cocaine use.  He denied SI/HI/AVH throughout multiple assessments.  Overnight, no acute concerns were identified nor was he observed to be experiencing any acute withdrawal symptoms.  His ethanol level was <10 which was inconsistent with his reportedly 6 pack of beer and a couple of shots he states he had taken right before arriving at [facility]. He was provided resources for sober living communities as his primary concern appears to be housing instability as opposed to substance use treatment.  Given his elevated A1c and abnormal lipid panel, he would benefit from seeing PCP and taking medications as prescribed.   Total Time spent with patient: 45 minutes  Past Psychiatric History: Alcohol  Abuse, Cocaine abuse, depression Past Medical History: diabetes, dyslipidemia Social History: homeless Tobacco Cessation:  N/A, patient does not currently use tobacco products  Current Medications:  Current Facility-Administered Medications  Medication Dose Route Frequency Provider Last Rate Last Admin   acetaminophen  (TYLENOL ) tablet 650 mg  650 mg Oral Q6H PRN Nwoko, Uchenna E, PA   650 mg at 01/04/24 0847   alum & mag hydroxide-simeth  (MAALOX/MYLANTA) 200-200-20 MG/5ML suspension 30 mL  30 mL Oral Q4H PRN Nwoko, Uchenna E, PA       hydrOXYzine  (ATARAX ) tablet 25 mg  25 mg Oral TID PRN Nwoko, Uchenna E, PA       magnesium  hydroxide (MILK OF MAGNESIA) suspension 30 mL  30 mL Oral Daily PRN Nwoko, Uchenna E, PA       traZODone  (DESYREL ) tablet 50 mg  50 mg Oral QHS PRN Nwoko, Uchenna E, PA   50 mg at 01/04/24 0015   Current Outpatient Medications  Medication Sig Dispense Refill   amLODipine  (NORVASC ) 5 MG tablet Take 1 tablet (5 mg total) by mouth daily. 30 tablet 0   doxepin  (SINEQUAN ) 10 MG capsule Take 1 capsule (10 mg total) by mouth at bedtime. 30 capsule 0   DULoxetine  (CYMBALTA ) 60 MG capsule Take 1 capsule (60 mg total) by mouth daily. 30 capsule 0   gabapentin  (NEURONTIN ) 600 MG tablet Take 1 tablet (600 mg total) by mouth 3 (three) times daily. 90 tablet 0   gemfibrozil  (LOPID ) 600 MG tablet Take 1 tablet (600 mg total) by mouth 2 (two) times daily before a meal. 60 tablet 0   glipiZIDE  (GLUCOTROL ) 5 MG tablet Take 1 tablet (5 mg total) by mouth daily. 30 tablet 0   insulin  aspart (NOVOLOG ) 100 UNIT/ML injection Inject 0-6 Units into the skin 3 (three) times daily with meals. 5.4 mL 0   lisinopril  (ZESTRIL ) 10 MG tablet Take 1 tablet (10 mg total) by mouth daily. 30 tablet 0   Meloxicam  15 MG TBDP Take 15 mg by mouth daily. 30 tablet 0  metFORMIN  (GLUCOPHAGE ) 500 MG tablet Take 1 tablet (500 mg total) by mouth 2 (two) times daily with a meal. 60 tablet 0   mirtazapine  (REMERON ) 15 MG tablet Take 1 tablet (15 mg total) by mouth at bedtime. 30 tablet 0   omeprazole  (PRILOSEC) 40 MG capsule Take 1 capsule (40 mg total) by mouth daily. 30 capsule 0    PTA Medications:  Facility Ordered Medications  Medication   acetaminophen  (TYLENOL ) tablet 650 mg   alum & mag hydroxide-simeth (MAALOX/MYLANTA) 200-200-20 MG/5ML suspension 30 mL   magnesium  hydroxide (MILK OF MAGNESIA) suspension 30 mL   hydrOXYzine  (ATARAX ) tablet  25 mg   traZODone  (DESYREL ) tablet 50 mg   PTA Medications  Medication Sig   Meloxicam  15 MG TBDP Take 15 mg by mouth daily.   omeprazole  (PRILOSEC) 40 MG capsule Take 1 capsule (40 mg total) by mouth daily.   metFORMIN  (GLUCOPHAGE ) 500 MG tablet Take 1 tablet (500 mg total) by mouth 2 (two) times daily with a meal.   gabapentin  (NEURONTIN ) 600 MG tablet Take 1 tablet (600 mg total) by mouth 3 (three) times daily.       11/29/2023   11:57 AM 08/19/2023    8:17 AM 08/19/2023    7:41 AM  Depression screen PHQ 2/9  Decreased Interest 1 0 0  Down, Depressed, Hopeless 1 0 0  PHQ - 2 Score 2 0 0  Altered sleeping 1    Tired, decreased energy 1    Change in appetite 0    Feeling bad or failure about yourself  1    Trouble concentrating 1    Moving slowly or fidgety/restless 1    Suicidal thoughts 0    PHQ-9 Score 7    Difficult doing work/chores Somewhat difficult      Flowsheet Row ED from 01/03/2024 in [facility] ED from 12/22/2023 in [facility] Emergency Department at [facility] ED from 12/21/2023 in [facility] Emergency Department at [facility]  C-SSRS RISK CATEGORY No Risk No Risk No Risk       Musculoskeletal  Strength & Muscle Tone: within normal limits Gait & Station: normal Patient leans: N/A  Psychiatric Specialty Exam  Presentation  General Appearance:  Disheveled  Eye Contact: Good  Speech: Clear and Coherent; Normal Rate  Speech Volume: Normal  Handedness: Right   Mood and Affect  Mood: Irritable  Affect: Congruent   Thought Process  Thought Processes: Coherent; Goal Directed  Descriptions of Associations:Intact  Orientation:Full (Time, Place and Person)  Thought Content:WDL  Diagnosis of Schizophrenia or Schizoaffective disorder in past: No    Hallucinations:Hallucinations: None  Ideas of Reference:None  Suicidal Thoughts:Suicidal Thoughts: No  Homicidal Thoughts:Homicidal  Thoughts: No   Sensorium  Memory: Immediate Good; Recent Good; Remote Good  Judgment: Fair  Insight: Fair   Art Therapist  Concentration: Good  Attention Span: Good  Recall: Good  Fund of Knowledge: Good  Language: Good   Psychomotor Activity  Psychomotor Activity: Psychomotor Activity: Normal   Assets  Assets: Communication Skills; Desire for Improvement; Physical Health; Resilience   Sleep  Sleep: Sleep: Poor   Nutritional Assessment (For OBS and FBC admissions only) Has the patient had a weight loss or gain of 10 pounds or more in the last 3 months?: No Has the patient had a decrease in food intake/or appetite?: No Does the patient have dental problems?: No Does the patient have eating habits or behaviors that may be indicators of  an eating disorder including binging or inducing vomiting?: No Has the patient recently lost weight without trying?: 0 Has the patient been eating poorly because of a decreased appetite?: 0 Malnutrition Screening Tool Score: 0    Physical Exam  Physical Exam ROS Blood pressure 124/86, pulse 75, temperature 98.3 F (36.8 C), temperature source Oral, resp. rate 18, SpO2 98%. There is no height or weight on file to calculate BMI.  Demographic Factors:  Male and Low socioeconomic status  Loss Factors: Legal issues and Financial problems/change in socioeconomic status  Historical Factors: Impulsivity  Risk Reduction Factors:   Positive coping skills or problem solving skills  Continued Clinical Symptoms:  Alcohol /Substance Abuse/Dependencies  Cognitive Features That Contribute To Risk:  Closed-mindedness    Suicide Risk:  Minimal: No identifiable suicidal ideation.  Patients presenting with no risk factors but with morbid ruminations; may be classified as minimal risk based on the severity of the depressive symptoms  Plan Of Care/Follow-up recommendations:   Follow-up recommendations:   Activity:  as  tolerated Diet:  heart healthy   Comments:  Prescriptions were given at discharge.  Patient is agreeable with the discharge plan.  Patient was given an opportunity to ask questions.  Patient appears to feel comfortable with discharge and denies any current suicidal or homicidal thoughts.    Patient is instructed prior to discharge to: Take all medications as prescribed by mental healthcare provider. Report any adverse effects and or reactions from the medicines to outpatient provider promptly. In the event of worsening symptoms, patient is instructed to call the crisis hotline, 911 and or go to the nearest ED for appropriate evaluation and treatment of symptoms. Patient is to follow-up with primary care provider for other medical issues, concerns and or health care needs.   Prentice Espy, MD 01/04/2024, 9:48 AM

## 2024-01-04 NOTE — Progress Notes (Signed)
 Orthopedic Tech Progress Note Patient Details:  [name] [name]. 27-Dec-1960 [id number]  Ortho Devices Type of Ortho Device: Crutches Ortho Device/Splint Interventions: Ordered, Application, Adjustment   Post Interventions Patient Tolerated: Well, Ambulated well Instructions Provided: Poper ambulation with device, Adjustment of device  [name] 01/04/2024, 1:34 PM

## 2024-01-04 NOTE — ED Provider Notes (Signed)
 [place] EMERGENCY DEPARTMENT AT [facility] Provider Note   CSN: [id number] Arrival date & time: 01/04/24  1208     History  Chief Complaint  Patient presents with   [name] [name]. is a 63 y.o. male.  HPI     63 year old patient comes in with chief complaint of fall.  Patient states that he had a fall yesterday because of icy conditions.  His pain has worsened.  He is limping.  He also has pain over the foot/ankle but it is not as bad.  Denies any head trauma.  Also request that we give him something for diarrhea and give him resources for detox.  Home Medications Prior to Admission medications   Medication Sig Start Date End Date Taking? Authorizing Provider  amLODipine  (NORVASC ) 5 MG tablet Take 1 tablet (5 mg total) by mouth daily. 12/03/23 01/02/24  Bobbitt, Shalon E, NP  doxepin  (SINEQUAN ) 10 MG capsule Take 1 capsule (10 mg total) by mouth at bedtime. 12/03/23 01/02/24  Bobbitt, Shalon E, NP  DULoxetine  (CYMBALTA ) 60 MG capsule Take 1 capsule (60 mg total) by mouth daily. 12/03/23 01/02/24  Bobbitt, Shalon E, NP  gabapentin  (NEURONTIN ) 600 MG tablet Take 1 tablet (600 mg total) by mouth 3 (three) times daily. 12/22/23   Idol, Julie, PA-C  gemfibrozil  (LOPID ) 600 MG tablet Take 1 tablet (600 mg total) by mouth 2 (two) times daily before a meal. 12/03/23 01/02/24  Bobbitt, Shalon E, NP  glipiZIDE  (GLUCOTROL ) 5 MG tablet Take 1 tablet (5 mg total) by mouth daily. 12/03/23 01/02/24  Bobbitt, Shalon E, NP  insulin  aspart (NOVOLOG ) 100 UNIT/ML injection Inject 0-6 Units into the skin 3 (three) times daily with meals. 08/18/23 09/17/23  Waymond Sieving, MD  lisinopril  (ZESTRIL ) 10 MG tablet Take 1 tablet (10 mg total) by mouth daily. 12/03/23 01/02/24  Bobbitt, Shalon E, NP  Meloxicam  15 MG TBDP Take 15 mg by mouth daily. 12/22/23   Idol, Julie, PA-C  metFORMIN  (GLUCOPHAGE ) 500 MG tablet Take 1 tablet (500 mg total) by mouth 2 (two) times daily with a meal. 12/22/23    Idol, Mliss, PA-C  mirtazapine  (REMERON ) 15 MG tablet Take 1 tablet (15 mg total) by mouth at bedtime. 12/03/23 01/02/24  Bobbitt, Shalon E, NP  omeprazole  (PRILOSEC) 40 MG capsule Take 1 capsule (40 mg total) by mouth daily. 12/22/23   Idol, Julie, PA-C      Allergies    Bee venom and Penicillins    Review of Systems   Review of Systems  All other systems reviewed and are negative.   Physical Exam Updated Vital Signs BP (!) 121/100 (BP Location: Right Arm)   Pulse 83   Temp 98.6 F (37 C) (Oral)   Resp 17   SpO2 99%  Physical Exam Vitals and nursing note reviewed.  Constitutional:      Appearance: He is well-developed.  HENT:     Head: Atraumatic.  Cardiovascular:     Rate and Rhythm: Normal rate.  Pulmonary:     Effort: Pulmonary effort is normal.  Musculoskeletal:        General: Tenderness present. No deformity.     Cervical back: Neck supple.     Comments: Patient able to ambulate  Skin:    General: Skin is warm.  Neurological:     Mental Status: He is alert and oriented to person, place, and time.     ED Results / Procedures / Treatments   Labs (  all labs ordered are listed, but only abnormal results are displayed) Labs Reviewed - No data to display  EKG None  Radiology No results found.  Procedures Procedures    Medications Ordered in ED Medications  naproxen  (NAPROSYN ) tablet 500 mg (500 mg Oral Given 01/04/24 1313)  loperamide  (IMODIUM ) capsule 2 mg (2 mg Oral Given 01/04/24 1313)    ED Course/ Medical Decision Making/ A&P                                 Medical Decision Making Risk Prescription drug management.   Patient comes in with chief complaint of knee pain. Indicates that he had a fall yesterday.  I assessed the patient, he is able to walk for me. No concerns for fractures or dislocation.  No indication for x-ray. Patient is requesting cane, we might be able to offer him crutches as needed. He is also requesting something for  diarrhea and detox resources, which have been provided.  Final Clinical Impression(s) / ED Diagnoses Final diagnoses:  Contusion of knee, unspecified laterality, initial encounter  Substance use disorder    Rx / DC Orders ED Discharge Orders     None         Charlyn Sora, MD 01/04/24 1352

## 2024-01-04 NOTE — ED Notes (Signed)
 Patient received and observed sleeping comfortable, no distress noted. Hourly intentional rounding in effect and no behavioral issues reported. Will continue to monitor and report changes noted.

## 2024-01-04 NOTE — ED Notes (Signed)
 Patient is sleeping. Respirations equal and unlabored, skin warm and dry. No change in assessment or acuity. Routine safety checks conducted according to facility protocol. Will continue to monitor for safety.

## 2024-01-04 NOTE — ED Notes (Signed)
 Pt is in the bed resting. Respirations are even and unlabored. No acute distress noted. Will continue to monitor for safety

## 2024-01-04 NOTE — ED Triage Notes (Signed)
 BIBA- fell on ice yesterday, unsure of whether he hit his head because he was intoxicated at the time.Pt c/o right hip pain, left knee pain, and generalized pain all over. 160/90 BP 56 HR 99% 123 cbg

## 2024-01-05 ENCOUNTER — Emergency Department (HOSPITAL_COMMUNITY)
Admission: EM | Admit: 2024-01-05 | Discharge: 2024-01-06 | Discharge status: Against Medical Advice | Payer: 59 | Attending: Emergency Medicine | Admitting: Emergency Medicine

## 2024-01-05 ENCOUNTER — Encounter (HOSPITAL_COMMUNITY): Payer: Self-pay | Admitting: Emergency Medicine

## 2024-01-05 ENCOUNTER — Ambulatory Visit (INDEPENDENT_AMBULATORY_CARE_PROVIDER_SITE_OTHER)
Admission: EM | Admit: 2024-01-05 | Discharge: 2024-01-05 | Disposition: A | Discharge status: Home / Self Care | Payer: 59 | Source: Home / Self Care

## 2024-01-05 DIAGNOSIS — Z5900 Homelessness unspecified: Secondary | ICD-10-CM | POA: Insufficient documentation

## 2024-01-05 DIAGNOSIS — Y900 Blood alcohol level of less than 20 mg/100 ml: Secondary | ICD-10-CM | POA: Insufficient documentation

## 2024-01-05 DIAGNOSIS — F32A Depression, unspecified: Secondary | ICD-10-CM | POA: Insufficient documentation

## 2024-01-05 DIAGNOSIS — F1010 Alcohol abuse, uncomplicated: Secondary | ICD-10-CM | POA: Diagnosis present

## 2024-01-05 DIAGNOSIS — Z79899 Other long term (current) drug therapy: Secondary | ICD-10-CM | POA: Insufficient documentation

## 2024-01-05 DIAGNOSIS — Z5321 Procedure and treatment not carried out due to patient leaving prior to being seen by health care provider: Secondary | ICD-10-CM | POA: Insufficient documentation

## 2024-01-05 DIAGNOSIS — Y909 Presence of alcohol in blood, level not specified: Secondary | ICD-10-CM | POA: Insufficient documentation

## 2024-01-05 LAB — RAPID URINE DRUG SCREEN, HOSP PERFORMED
Amphetamines: NOT DETECTED
Barbiturates: NOT DETECTED
Benzodiazepines: NOT DETECTED
Cocaine: POSITIVE — AB
Opiates: NOT DETECTED
Tetrahydrocannabinol: NOT DETECTED

## 2024-01-05 LAB — COMPREHENSIVE METABOLIC PANEL
ALT: 14 U/L (ref 0–44)
AST: 26 U/L (ref 15–41)
Albumin: 3.5 g/dL (ref 3.5–5.0)
Alkaline Phosphatase: 36 U/L — ABNORMAL LOW (ref 38–126)
Anion gap: 9 (ref 5–15)
BUN: 9 mg/dL (ref 6–20)
CO2: 25 mmol/L (ref 22–32)
Calcium: 9 mg/dL (ref 8.9–10.3)
Chloride: 104 mmol/L (ref 98–111)
Creatinine, Ser: 1 mg/dL (ref 0.61–1.24)
GFR, Estimated: >60 mL/min (ref >60)
Glucose, Bld: 162 mg/dL — ABNORMAL HIGH (ref 70–99)
Potassium: 3.7 mmol/L (ref 3.5–5.1)
Sodium: 138 mmol/L (ref 135–145)
Total Bilirubin: 0.6 mg/dL (ref 0.0–1.2)
Total Protein: 6.4 g/dL — ABNORMAL LOW (ref 6.5–8.1)

## 2024-01-05 LAB — CBC WITH DIFFERENTIAL/PLATELET
Abs Immature Granulocytes: 0.02 10*3/uL (ref 0.00–0.07)
Basophils Absolute: 0 10*3/uL (ref 0.0–0.1)
Basophils Relative: 1 %
Eosinophils Absolute: 0.2 10*3/uL (ref 0.0–0.5)
Eosinophils Relative: 4 %
HCT: 40.1 % (ref 39.0–52.0)
Hemoglobin: 13.1 g/dL (ref 13.0–17.0)
Immature Granulocytes: 0 %
Lymphocytes Relative: 42 %
Lymphs Abs: 2.2 10*3/uL (ref 0.7–4.0)
MCH: 29.4 pg (ref 26.0–34.0)
MCHC: 32.7 g/dL (ref 30.0–36.0)
MCV: 90.1 fL (ref 80.0–100.0)
Monocytes Absolute: 0.3 10*3/uL (ref 0.1–1.0)
Monocytes Relative: 6 %
Neutro Abs: 2.4 10*3/uL (ref 1.7–7.7)
Neutrophils Relative %: 47 %
Platelets: 282 10*3/uL (ref 150–400)
RBC: 4.45 MIL/uL (ref 4.22–5.81)
RDW: 15.5 % (ref 11.5–15.5)
WBC: 5.1 10*3/uL (ref 4.0–10.5)
nRBC: 0 % (ref 0.0–0.2)

## 2024-01-05 LAB — ETHANOL: Alcohol, Ethyl (B): <10 mg/dL (ref <10)

## 2024-01-05 NOTE — ED Provider Notes (Signed)
 Behavioral Health Urgent Care Medical Screening Exam  Patient Name: [name] [name]. MRN: [id number] Date of Evaluation: 01/05/24 Chief Complaint:  I need someone to stay tonight Diagnosis:  Final diagnoses:  Homelessness    History of Present illness: [name] Lennartz. is a 63 y.o. male patient presented to  [facility] as a walk in unaccompanied with complaints of I need someone to stay the night  [name] Rosalynn Raddle., 63 y.o., male patient seen face to face by this provider, chart reviewed, and consulted with Dr. Cambronne on 01/05/24.  Per chart review patient has a past psychiatric history significant for polysubstance abuse, cocaine use, and alcohol  abuse.  Patient well-known to the psychiatric service.  Of note patient was admitted to the [facility] continuous assessment unit 01/03/2024 and was discharged on 01/04/2024.  Upon admission patient reported that he reportedly drinks 6 pack of her and a couple shots before arriving to [facility]. His ETOH level upon admission was less than 10.  Upon discharge he was provided prescriptions for current medciations and was agreeable with the plan to discharge.  During evaluation [name] Rosalynn Raddle. is observed sitting in the assessment area in no acute distress.  He is disheveled and slightly malodorous.  He is alert/oriented x 4, cooperative, calm, and attentive.  He endorses depression in the context of being homeless and his substance use.  He does feel hopeful as he has been accepted to [facility] treatment center.  He also has a grandchild on the way.  He is forward thinking and identifies multipke protective factors.  He has a euthymic affect.  He adamantly denies suicidal/homicidal ideations.  He denies access to firearms/weapons.  He denies auditory/visual hallucinations.  He does not appear manic, psychotic, delusional, or paranoid.  He does not appear to be responding to internal/external stimuli.  He is able to answer questions appropriately.  He reports his last  drink of alcohol  was on Saturday.  He last used cocaine on Saturday as well.  He is denying any alcohol  withdrawal symptoms at this time.  He denies alcohol  withdrawal seizure and delirium tremors.  At this time [name] Jury. is educated and verbalizes understanding of mental health resources and other crisis services in the community.  He is instructed to call 911 and present to the nearest emergency room should he experience any suicidal/homicidal ideation, auditory/visual/hallucinations, or detrimental worsening of his mental health condition.    Throughout assessment patient remained focused on housing for the night.  Discussed providing a [place] bus pass to the [employer] and patient was concerned that he would not be able to carry his multiple crates/containers of belongings by himself.  Out of courtesy, provided patient with a taxi voucher to the [employer] where fedex shelter is in effect.   When presenting back to the lobby to inform patient that taxi had been called.  He had just gotten off the phone and states that he has called his insurance company to come pick him up and take him to the hospital.  Patient further explained that he felt like he would be able to stay the night in the hospital and not have to go to the shelter.  Explained to the patient that he was being provided a meal, a taxi voucher to the [employer] and two [place] bus passes for him to use in the a.m. to make it to the bus station where he reports [facility] treatment center has bought him a ticket to make it  to their facility.  Patient agreed to take taxi voucher to [facility] and to not present to the emergency department for housing.     Flowsheet Row ED from 01/05/2024 in [facility] Emergency Department at [facility] Most recent reading at 01/05/2024  8:36 PM ED from 01/05/2024 in [facility] Most recent reading at 01/05/2024  7:00 PM ED from 01/04/2024 in [facility] Emergency Department at [facility] Most recent reading at 01/04/2024 12:16 PM  C-SSRS RISK CATEGORY No Risk No Risk No Risk       Psychiatric Specialty Exam  Presentation  General Appearance:Disheveled  Eye Contact:Good  Speech:Clear and Coherent; Normal Rate  Speech Volume:Normal  Handedness:Right   Mood and Affect  Mood: Depressed  Affect: Congruent   Thought Process  Thought Processes: Coherent  Descriptions of Associations:Intact  Orientation:Full (Time, Place and Person)  Thought Content:Logical  Diagnosis of Schizophrenia or Schizoaffective disorder in past: No   Hallucinations:None  Ideas of Reference:None  Suicidal Thoughts:No  Homicidal Thoughts:No   Sensorium  Memory: Immediate Good; Recent Good; Remote Good  Judgment: Fair  Insight: Fair   Art Therapist  Concentration: Good  Attention Span: Good  Recall: Good  Fund of Knowledge: Good  Language: Good   Psychomotor Activity  Psychomotor Activity: Normal   Assets  Assets: Communication Skills; Desire for Improvement; Financial Resources/Insurance; Leisure Time; Physical Health; Resilience   Sleep  Sleep: Fair  Number of hours:  5   Physical Exam: Physical Exam Vitals and nursing note reviewed.  Constitutional:      Appearance: Normal appearance.  Eyes:     General:        Right eye: No discharge.        Left eye: No discharge.  Pulmonary:     Effort: Pulmonary effort is normal. No respiratory distress.  Musculoskeletal:        General: Normal range of motion.     Cervical back: Normal range of motion.  Neurological:     Mental Status: He is alert and oriented to person, place, and time.  Psychiatric:        Attention and Perception: Attention and perception normal.        Mood and Affect: Affect normal. Mood is depressed.        Speech: Speech normal.        Behavior: Behavior normal. Behavior is cooperative.        Thought  Content: Thought content normal.        Cognition and Memory: Cognition normal.        Judgment: Judgment normal.    Review of Systems  Constitutional:  Negative for chills and weight loss.  HENT:  Negative for hearing loss.   Eyes: Negative.   Respiratory:  Negative for cough and shortness of breath.   Cardiovascular:  Negative for chest pain.  Gastrointestinal:  Negative for nausea and vomiting.  Musculoskeletal: Negative.   Neurological: Negative.  Negative for seizures.  Psychiatric/Behavioral:  Positive for depression and hallucinations.    There were no vitals taken for this visit. There is no height or weight on file to calculate BMI. MHT states pt refused vitals   Musculoskeletal: Strength & Muscle Tone: within normal limits Gait & Station: normal Patient leans: N/A   [facility] MSE Discharge Disposition for Follow up and Recommendations: Based on my evaluation the patient does not appear to have an emergency medical condition and can be discharged with resources and follow  up care in outpatient services for Medication Management, Individual Therapy, and residential substance abuse treatment  Discharge patient  Provided patient with meal, taxi voucher to the [employer] for shelter, and two [place] bus passes for the a.m. patient reports he has been accepted to [facility] treatment center and they have purchased him a bus ticket and he needs to be at the station by 7:15 AM  Elveria VEAR Batter, NP 01/05/2024, 05:18 PM

## 2024-01-05 NOTE — ED Triage Notes (Signed)
 Pt is homeless and reports he has no where to go. Is worried he is withdrawing from alcohol. Last drink Saturday. Pt appears WNL and no distress.

## 2024-01-05 NOTE — ED Notes (Signed)
 PT given happy meal and drink

## 2024-01-05 NOTE — Discharge Instructions (Signed)

## 2024-01-05 NOTE — ED Provider Triage Note (Signed)
 Emergency Medicine Provider Triage Evaluation Note  [name] [name]. , a 63 y.o. male  was evaluated in triage.  Pt complains of homelessness. States that he doesn't have a place to stay tonight. States he's going to rehab tomorrow but is worried to be on his own. States that he went to [facility] and they sent him here because he uses insulin  and they can't manage that. He states that he is going to rehab for alcohol  abuse. Last drink was Saturday. States that he is concerned for withdrawal. States he feels 'shaky.' Denies any other symptoms.   Review of Systems  Positive:  Negative:   Physical Exam  BP (!) 132/96 (BP Location: Right Arm)   Pulse 80   Temp 97.9 F (36.6 C)   Resp 18   SpO2 97%  Gen:   Awake, no distress   Resp:  Normal effort  MSK:   Moves extremities without difficulty  Other:  Not tremulous on exam. Sitting comfortably in chair  Medical Decision Making  Medically screening exam initiated at 8:35 PM.  Appropriate orders placed.  [name] [name]. was informed that the remainder of the evaluation will be completed by another provider, this initial triage assessment does not replace that evaluation, and the importance of remaining in the ED until their evaluation is complete.     [name] [name] A, PA-C 01/05/24 2039

## 2024-01-05 NOTE — Discharge Summary (Signed)
 [name] [name]. to be D/C'd Home per NP order.  An After Visit Summary was printed and given to the patient by provider. Patient escorted out, and D/C home via taxicab. [name]  01/05/2024 5:52 PM

## 2024-01-05 NOTE — Progress Notes (Signed)
   01/05/24 1854  [facility] Triage Screening (Walk-ins at [facility] only)  How Did You Hear About Us ? Self  What Is the Reason for Your Visit/Call Today? Patient is a 63 year old male that presents this date voluntary as a walk in to [facility] requesting, a place to stay for the night, because he has been accepted to [facility] on 1/14. Patient denies any SI, HI or AVH. Patient states he is currently homeless and is afraid of using if he isn't in a, safe place.  Patient has been seen multiple times in the past for SA issues and admitted to [facility] and [facility] on numerous occasions.  How Long Has This Been Causing You Problems? <Week  Have You Recently Had Any Thoughts About Hurting Yourself? No  How long ago did you have thoughts about hurting yourself? NA  Are You Planning to Commit Suicide/Harm Yourself At This time? No  Have you Recently Had Thoughts About Hurting Someone Sherral? No  Are You Planning To Harm Someone At This Time? No  Explanation: NA  Physical Abuse Denies  Verbal Abuse Denies  Sexual Abuse Denies  Exploitation of patient/patient's resources Denies  Self-Neglect Denies  Possible abuse reported to: Other (Comment) (NA)  Are you currently experiencing any auditory, visual or other hallucinations? No  Have You Used Any Alcohol  or Drugs in the Past 24 Hours? No  How long ago did you use Drugs or Alcohol ? NA  What Did You Use and How Much? NA  Do you have any current medical co-morbidities that require immediate attention? No  Clinician description of patient physical appearance/behavior: Patient presents with a plesant affect  What Do You Feel Would Help You the Most Today?  (NA)  If access to [facility] was not available, would you have sought care in the Emergency Department? No  Determination of Need Routine (7 days)  Options For Referral Other: Comment (NA)

## 2024-01-06 NOTE — ED Notes (Signed)
 Pt left d/t wait time
